# Patient Record
Sex: Female | Born: 1945 | Race: Black or African American | Hispanic: No | Marital: Single | Smoking: Former smoker
Health system: Southern US, Community
[De-identification: ages and names within clinical notes are randomized; demographics above are authoritative.]

## PROBLEM LIST (undated history)

## (undated) DIAGNOSIS — Z8719 Personal history of other diseases of the digestive system: Secondary | ICD-10-CM

## (undated) DIAGNOSIS — E785 Hyperlipidemia, unspecified: Secondary | ICD-10-CM

## (undated) DIAGNOSIS — M1990 Unspecified osteoarthritis, unspecified site: Secondary | ICD-10-CM

## (undated) DIAGNOSIS — K219 Gastro-esophageal reflux disease without esophagitis: Secondary | ICD-10-CM

## (undated) DIAGNOSIS — C801 Malignant (primary) neoplasm, unspecified: Secondary | ICD-10-CM

## (undated) DIAGNOSIS — R002 Palpitations: Secondary | ICD-10-CM

## (undated) DIAGNOSIS — I10 Essential (primary) hypertension: Secondary | ICD-10-CM

## (undated) DIAGNOSIS — J45909 Unspecified asthma, uncomplicated: Secondary | ICD-10-CM

## (undated) DIAGNOSIS — F32A Depression, unspecified: Secondary | ICD-10-CM

## (undated) DIAGNOSIS — J40 Bronchitis, not specified as acute or chronic: Secondary | ICD-10-CM

## (undated) DIAGNOSIS — T7840XA Allergy, unspecified, initial encounter: Secondary | ICD-10-CM

## (undated) DIAGNOSIS — R933 Abnormal findings on diagnostic imaging of other parts of digestive tract: Secondary | ICD-10-CM

## (undated) DIAGNOSIS — M7989 Other specified soft tissue disorders: Secondary | ICD-10-CM

## (undated) DIAGNOSIS — R0602 Shortness of breath: Secondary | ICD-10-CM

## (undated) DIAGNOSIS — R011 Cardiac murmur, unspecified: Secondary | ICD-10-CM

## (undated) DIAGNOSIS — F419 Anxiety disorder, unspecified: Secondary | ICD-10-CM

## (undated) DIAGNOSIS — R079 Chest pain, unspecified: Secondary | ICD-10-CM

## (undated) DIAGNOSIS — M79674 Pain in right toe(s): Secondary | ICD-10-CM

## (undated) DIAGNOSIS — G4730 Sleep apnea, unspecified: Secondary | ICD-10-CM

## (undated) DIAGNOSIS — I209 Angina pectoris, unspecified: Secondary | ICD-10-CM

## (undated) DIAGNOSIS — N189 Chronic kidney disease, unspecified: Secondary | ICD-10-CM

## (undated) HISTORY — DX: Unspecified osteoarthritis, unspecified site: M19.90

## (undated) HISTORY — DX: Diabetes mellitus due to underlying condition with unspecified complications: E08.8

## (undated) HISTORY — DX: Allergy, unspecified, initial encounter: T78.40XA

## (undated) HISTORY — PX: KNEE SURGERY: SHX244

## (undated) HISTORY — PX: SHOULDER SURGERY: SHX246

## (undated) HISTORY — DX: Palpitations: R00.2

## (undated) HISTORY — DX: Other specified soft tissue disorders: M79.89

## (undated) HISTORY — DX: Anxiety disorder, unspecified: F41.9

## (undated) HISTORY — DX: Hyperlipidemia, unspecified: E78.5

## (undated) HISTORY — PX: ABDOMINAL HYSTERECTOMY: SHX81

## (undated) HISTORY — DX: Pain in right toe(s): M79.674

## (undated) HISTORY — PX: BREAST SURGERY: SHX581

## (undated) HISTORY — DX: Chest pain, unspecified: R07.9

## (undated) HISTORY — PX: DILATION AND CURETTAGE OF UTERUS: SHX78

## (undated) HISTORY — DX: Atherosclerotic heart disease of native coronary artery without angina pectoris: I25.10

## (undated) HISTORY — DX: Essential (primary) hypertension: I10

## (undated) HISTORY — PX: WRIST SURGERY: SHX841

## (undated) HISTORY — DX: Sleep apnea, unspecified: G47.30

## (undated) HISTORY — DX: Abnormal findings on diagnostic imaging of other parts of digestive tract: R93.3

## (undated) HISTORY — PX: HYSTEROTOMY: SHX1776

---

## 2000-06-25 ENCOUNTER — Emergency Department (HOSPITAL_COMMUNITY): Admission: EM | Admit: 2000-06-25 | Discharge: 2000-06-25 | Payer: Self-pay | Admitting: Emergency Medicine

## 2004-10-17 ENCOUNTER — Encounter: Admission: RE | Admit: 2004-10-17 | Discharge: 2004-10-17 | Payer: Self-pay | Admitting: Unknown Physician Specialty

## 2006-01-15 ENCOUNTER — Encounter: Admission: RE | Admit: 2006-01-15 | Discharge: 2006-01-15 | Payer: Self-pay | Admitting: Unknown Physician Specialty

## 2007-02-07 ENCOUNTER — Encounter: Admission: RE | Admit: 2007-02-07 | Discharge: 2007-02-07 | Payer: Self-pay | Admitting: Orthopedic Surgery

## 2011-05-16 ENCOUNTER — Telehealth: Payer: Self-pay | Admitting: Gastroenterology

## 2011-05-16 ENCOUNTER — Other Ambulatory Visit: Payer: Self-pay

## 2011-05-16 ENCOUNTER — Telehealth: Payer: Self-pay

## 2011-05-16 DIAGNOSIS — K838 Other specified diseases of biliary tract: Secondary | ICD-10-CM

## 2011-05-16 DIAGNOSIS — R933 Abnormal findings on diagnostic imaging of other parts of digestive tract: Secondary | ICD-10-CM

## 2011-05-16 NOTE — Telephone Encounter (Signed)
Pt has been instructed and meds reviewed pt was also given the pre appt for 05/17/11 at noon at [facility]  Pt will call with any further questions or concerns.

## 2011-05-16 NOTE — Telephone Encounter (Signed)
Pt had another question about her procedure and why she needed to go to the endoscopy unit before the appt to speak with anesthesia I gave her the number so she could get the answers she needed from them

## 2011-05-17 ENCOUNTER — Encounter (HOSPITAL_COMMUNITY)
Admission: RE | Admit: 2011-05-17 | Discharge: 2011-05-17 | Disposition: A | Discharge status: Home / Self Care | Payer: Medicare PPO | Source: Ambulatory Visit | Attending: Gastroenterology | Admitting: Gastroenterology

## 2011-05-17 ENCOUNTER — Encounter (HOSPITAL_COMMUNITY): Payer: Self-pay | Admitting: *Deleted

## 2011-05-17 NOTE — Anesthesia Preprocedure Evaluation (Signed)
Anesthesia Evaluation    Airway Mallampati: II TM Distance: >3 FB Neck ROM: Full    Dental No notable dental hx. (+) Edentulous Upper and Edentulous Lower   Pulmonary shortness of breath and with exertion, Current Smoker,  DOE for a couple years clear to auscultation  Pulmonary exam normal       Cardiovascular hypertension, + angina + Valvular Problems/Murmurs Regular Normal    Neuro/Psych    GI/Hepatic hiatal hernia, GERD-  Medicated,  Endo/Other  Diabetes mellitus-, Type 2  Renal/GU      Musculoskeletal   Abdominal   Peds  Hematology   Anesthesia Other Findings   Reproductive/Obstetrics                           Anesthesia Physical Anesthesia Plan  ASA: III  Anesthesia Plan: MAC   Post-op Pain Management:    Induction: Intravenous  Airway Management Planned:   Additional Equipment:   Intra-op Plan:   Post-operative Plan: Extubation in OR  Informed Consent: I have reviewed the patients History and Physical, chart, labs and discussed the procedure including the risks, benefits and alternatives for the proposed anesthesia with the patient or authorized representative who has indicated his/her understanding and acceptance.   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

## 2011-05-19 ENCOUNTER — Ambulatory Visit (HOSPITAL_COMMUNITY)
Admission: RE | Admit: 2011-05-19 | Discharge: 2011-05-19 | Disposition: A | Discharge status: Home / Self Care | Payer: Medicare PPO | Source: Ambulatory Visit | Attending: Gastroenterology | Admitting: Gastroenterology

## 2011-05-19 ENCOUNTER — Encounter (HOSPITAL_COMMUNITY): Payer: Self-pay | Admitting: *Deleted

## 2011-05-19 ENCOUNTER — Ambulatory Visit (HOSPITAL_COMMUNITY): Payer: Medicare PPO | Admitting: *Deleted

## 2011-05-19 ENCOUNTER — Encounter (HOSPITAL_COMMUNITY)
Admission: RE | Disposition: A | Discharge status: Home / Self Care | Payer: Self-pay | Source: Ambulatory Visit | Attending: Gastroenterology

## 2011-05-19 ENCOUNTER — Encounter (HOSPITAL_COMMUNITY): Payer: Self-pay | Admitting: Gastroenterology

## 2011-05-19 ENCOUNTER — Encounter (HOSPITAL_COMMUNITY): Payer: Self-pay | Admitting: Anesthesiology

## 2011-05-19 DIAGNOSIS — E119 Type 2 diabetes mellitus without complications: Secondary | ICD-10-CM | POA: Insufficient documentation

## 2011-05-19 DIAGNOSIS — E785 Hyperlipidemia, unspecified: Secondary | ICD-10-CM | POA: Insufficient documentation

## 2011-05-19 DIAGNOSIS — R933 Abnormal findings on diagnostic imaging of other parts of digestive tract: Secondary | ICD-10-CM

## 2011-05-19 DIAGNOSIS — K869 Disease of pancreas, unspecified: Secondary | ICD-10-CM | POA: Insufficient documentation

## 2011-05-19 DIAGNOSIS — K219 Gastro-esophageal reflux disease without esophagitis: Secondary | ICD-10-CM | POA: Insufficient documentation

## 2011-05-19 DIAGNOSIS — R109 Unspecified abdominal pain: Secondary | ICD-10-CM | POA: Insufficient documentation

## 2011-05-19 DIAGNOSIS — I10 Essential (primary) hypertension: Secondary | ICD-10-CM | POA: Insufficient documentation

## 2011-05-19 DIAGNOSIS — K449 Diaphragmatic hernia without obstruction or gangrene: Secondary | ICD-10-CM | POA: Insufficient documentation

## 2011-05-19 DIAGNOSIS — R0602 Shortness of breath: Secondary | ICD-10-CM | POA: Insufficient documentation

## 2011-05-19 HISTORY — DX: Essential (primary) hypertension: I10

## 2011-05-19 HISTORY — DX: Angina pectoris, unspecified: I20.9

## 2011-05-19 HISTORY — DX: Bronchitis, not specified as acute or chronic: J40

## 2011-05-19 HISTORY — DX: Abnormal findings on diagnostic imaging of other parts of digestive tract: R93.3

## 2011-05-19 HISTORY — DX: Hyperlipidemia, unspecified: E78.5

## 2011-05-19 HISTORY — DX: Gastro-esophageal reflux disease without esophagitis: K21.9

## 2011-05-19 HISTORY — DX: Cardiac murmur, unspecified: R01.1

## 2011-05-19 HISTORY — DX: Personal history of other diseases of the digestive system: Z87.19

## 2011-05-19 HISTORY — PX: EUS: SHX5427

## 2011-05-19 HISTORY — DX: Shortness of breath: R06.02

## 2011-05-19 LAB — GLUCOSE, CAPILLARY: Glucose-Capillary: 116 mg/dL — ABNORMAL HIGH (ref 70–99)

## 2011-05-19 SURGERY — UPPER ENDOSCOPIC ULTRASOUND (EUS) LINEAR
Anesthesia: Monitor Anesthesia Care

## 2011-05-19 MED ORDER — MIDAZOLAM HCL 5 MG/5ML IJ SOLN
INTRAMUSCULAR | Status: DC | PRN
  Administered 2011-05-19 (×2): 1 mg via INTRAVENOUS

## 2011-05-19 MED ORDER — FENTANYL CITRATE 0.05 MG/ML IJ SOLN
INTRAMUSCULAR | Status: DC | PRN
  Administered 2011-05-19 (×2): 50 ug via INTRAVENOUS

## 2011-05-19 MED ORDER — FENTANYL CITRATE 0.05 MG/ML IJ SOLN: 25.0000 ug | INTRAMUSCULAR | Status: DC | PRN

## 2011-05-19 MED ORDER — PROMETHAZINE HCL 25 MG/ML IJ SOLN: 6.2500 mg | INTRAMUSCULAR | Status: DC | PRN

## 2011-05-19 MED ORDER — PROPOFOL 10 MG/ML IV EMUL
INTRAVENOUS | Status: DC | PRN
  Administered 2011-05-19: 120 ug/kg/min via INTRAVENOUS

## 2011-05-19 MED ORDER — LACTATED RINGERS IV SOLN
INTRAVENOUS | Status: DC
  Administered 2011-05-19: 1000 mL via INTRAVENOUS

## 2011-05-19 NOTE — H&P (Signed)
HPI: This is a  66 yo woman with abd pain.  CT 03/2009 (urogram) showed "prominant pancreatic head" More recent 12/12 CT with IV and po contrast also shows prominent panc head, slightly dilated CBD" but no clear masses.  LFTs normal    Past Medical History  Diagnosis Date  . Hypertension   . Diabetes mellitus   . Hyperlipemia   . GERD (gastroesophageal reflux disease)   . Heart murmur   . Angina     normal stress test  . Shortness of breath     on  excertion  . Bronchitis   . H/O hiatal hernia     Past Surgical History  Procedure Date  . Breast surgery     begin mass,left  breast  . Wrist surgery     rigth  . Shoulder surgery     rotator cuff repair  . Abdominal hysterectomy   . Knee surgery   . Right thumb     tendon repaire  . Dilation and curettage of uterus     one    No current facility-administered medications for this encounter.    Allergies as of 05/16/2011  . (Not on File)    History reviewed. No pertinent family history.  History   Social History  . Marital Status: Single    Spouse Name: N/A    Number of Children: N/A  . Years of Education: N/A   Occupational History  . Not on file.   Social History Main Topics  . Smoking status: Former Smoker -- 1.0 packs/day    Quit date: 05/17/2011  . Smokeless tobacco: Not on file  . Alcohol Use: No  . Drug Use: No  . Sexually Active: No   Other Topics Concern  . Not on file   Social History Narrative  . No narrative on file      Physical Exam: BP 114/70  Pulse 56  Temp(Src) 97.5 F (36.4 C) (Oral)  Resp 15  Ht 5' 5.5" (1.664 m)  Wt 89.812 kg (198 lb)  BMI 32.45 kg/m2  SpO2 99% Constitutional: generally well-appearing Psychiatric: alert and oriented x3 Abdomen: soft, nontender, nondistended, no obvious ascites, no peritoneal signs, normal bowel sounds     Assessment and plan: 66 y.o. female with abnormal ct  Upper EUS +/- fna to evaluated pancrease

## 2011-05-19 NOTE — Transfer of Care (Signed)
Immediate Anesthesia Transfer of Care Note  Patient: Deborah Hutchinson  Procedure(s) Performed:  UPPER ENDOSCOPIC ULTRASOUND (EUS) LINEAR - radial linear   Patient Location: PACU  Anesthesia Type: MAC  Level of Consciousness: awake and alert   Airway & Oxygen Therapy: Patient Spontanous Breathing and Patient connected to nasal cannula oxygen  Post-op Assessment: Report given to PACU RN and Post -op Vital signs reviewed and stable  Post vital signs: Reviewed and stable  Complications: No apparent anesthesia complications

## 2011-05-19 NOTE — Anesthesia Postprocedure Evaluation (Signed)
Anesthesia Post Note  Patient: Deborah Hutchinson  Procedure(s) Performed:  UPPER ENDOSCOPIC ULTRASOUND (EUS) LINEAR - radial linear   Anesthesia type: MAC  Patient location: PACU  Post pain: Pain level controlled  Post assessment: Post-op Vital signs reviewed  Last Vitals:  Filed Vitals:   05/19/11 0809  BP: 152/116  Pulse:   Temp:   Resp: 12    Post vital signs: Reviewed  Level of consciousness: sedated  Complications: No apparent anesthesia complications

## 2011-05-19 NOTE — Op Note (Signed)
[facility] [address] [place], [place]  [address]  ENDOSCOPIC ULTRASOUND PROCEDURE REPORT  PATIENT:  Deborah Hutchinson, Deborah Hutchinson  MR#:  [id number] BIRTHDATE:  [birth year]  GENDER:  female ENDOSCOPIST:  Rachael Fee, MD REFERRED BY:  Laurell Roof, M.D. PROCEDURE DATE:  05/19/2011 PROCEDURE:  Upper EUS ASA CLASS:  Class II INDICATIONS:  abdominal pain; "prominant pancreatic head.without discrete mass" on CT last month.  Normal LFTs. No recent weight loss, no family hisotory of pancreatic cancer, remote (20 years ago) alcohol overuse MEDICATIONS:  MAC sedation, administered by CRNA  DESCRIPTION OF PROCEDURE:   After the risks benefits and alternatives of the procedure were  explained, informed consent was obtained. The patient was then placed in the left, lateral, decubitus postion and IV sedation was administered. Throughout the procedure, the patient's blood pressure, pulse and oxygen saturations were monitored continuously.  Under direct visualization, the  endoscope was introduced through the mouth and advanced to the second portion of the duodenum.  Water was used as necessary to provide an acoustic interface.  Upon completion of the imaging, water was removed and the patient was sent to the recovery room in satisfactory condition. <<PROCEDUREIMAGES>> Endoscopic findings (limited views with radial echoendoscope): 1. Normal UGI tract  EUS findings: 1. Prominant pancreatic head (4cm) without discrete masses. Pancreatic parenchyma throught neck, body and tail was normal as well. 2. Pancreatic divisum ruled out in this examinaiton 3. CBD was non-dilated and without filling defects 4. No peripancreatic adenopathy. 5. Gallbladder was normal 6. Limited views of liver, spleen, portal and splenic vessels were all normal  Impression: Somewhat enlarged pancreatic head without discrete masses, lesions.  ______________________________ Rachael Fee,  MD  n. eSIGNED:   Rachael Fee at 05/19/2011 08:06 AM  Starling Manns, [id number]

## 2011-05-20 ENCOUNTER — Encounter (HOSPITAL_COMMUNITY): Payer: Self-pay | Admitting: Gastroenterology

## 2013-09-05 ENCOUNTER — Ambulatory Visit (INDEPENDENT_AMBULATORY_CARE_PROVIDER_SITE_OTHER): Payer: Commercial Managed Care - HMO

## 2013-09-05 VITALS — BP 137/70 | HR 60 | Resp 12

## 2013-09-05 DIAGNOSIS — E1142 Type 2 diabetes mellitus with diabetic polyneuropathy: Secondary | ICD-10-CM

## 2013-09-05 DIAGNOSIS — E1140 Type 2 diabetes mellitus with diabetic neuropathy, unspecified: Secondary | ICD-10-CM

## 2013-09-05 DIAGNOSIS — L608 Other nail disorders: Secondary | ICD-10-CM

## 2013-09-05 DIAGNOSIS — M79609 Pain in unspecified limb: Secondary | ICD-10-CM

## 2013-09-05 DIAGNOSIS — E1149 Type 2 diabetes mellitus with other diabetic neurological complication: Secondary | ICD-10-CM

## 2013-09-05 NOTE — Patient Instructions (Signed)
Diabetes and Foot Care Diabetes may cause you to have problems because of poor blood supply (circulation) to your feet and legs. This may cause the skin on your feet to become thinner, break easier, and heal more slowly. Your skin may become dry, and the skin may peel and crack. You may also have nerve damage in your legs and feet causing decreased feeling in them. You may not notice minor injuries to your feet that could lead to infections or more serious problems. Taking care of your feet is one of the most important things you can do for yourself.  HOME CARE INSTRUCTIONS  Wear shoes at all times, even in the house. Do not go barefoot. Bare feet are easily injured.  Check your feet daily for blisters, cuts, and redness. If you cannot see the bottom of your feet, use a mirror or ask someone for help.  Wash your feet with warm water (do not use hot water) and mild soap. Then pat your feet and the areas between your toes until they are completely dry. Do not soak your feet as this can dry your skin.  Apply a moisturizing lotion or petroleum jelly (that does not contain alcohol and is unscented) to the skin on your feet and to dry, brittle toenails. Do not apply lotion between your toes.  Trim your toenails straight across. Do not dig under them or around the cuticle. File the edges of your nails with an emery board or nail file.  Do not cut corns or calluses or try to remove them with medicine.  Wear clean socks or stockings every day. Make sure they are not too tight. Do not wear knee-high stockings since they may decrease blood flow to your legs.  Wear shoes that fit properly and have enough cushioning. To break in new shoes, wear them for just a few hours a day. This prevents you from injuring your feet. Always look in your shoes before you put them on to be sure there are no objects inside.  Do not cross your legs. This may decrease the blood flow to your feet.  If you find a minor scrape,  cut, or break in the skin on your feet, keep it and the skin around it clean and dry. These areas may be cleansed with mild soap and water. Do not cleanse the area with peroxide, alcohol, or iodine.  When you remove an adhesive bandage, be sure not to damage the skin around it.  If you have a wound, look at it several times a day to make sure it is healing.  Do not use heating pads or hot water bottles. They may burn your skin. If you have lost feeling in your feet or legs, you may not know it is happening until it is too late.  Make sure your health care provider performs a complete foot exam at least annually or more often if you have foot problems. Report any cuts, sores, or bruises to your health care provider immediately. SEEK MEDICAL CARE IF:   You have an injury that is not healing.  You have cuts or breaks in the skin.  You have an ingrown nail.  You notice redness on your legs or feet.  You feel burning or tingling in your legs or feet.  You have pain or cramps in your legs and feet.  Your legs or feet are numb.  Your feet always feel cold. SEEK IMMEDIATE MEDICAL CARE IF:   There is increasing redness,   swelling, or pain in or around a wound.  There is a red line that goes up your leg.  Pus is coming from a wound.  You develop a fever or as directed by your health care provider.  You notice a bad smell coming from an ulcer or wound. Document Released: 04/22/2000 Document Revised: 12/26/2012 Document Reviewed: 10/02/2012 [facility] Patient Information 2014 [place].  As far as the small spicules of nail growing from the right great toe keep them trimmed down and are file down and cleaner bursa with soap and water and to apply some Neosporin occasionally as needed to avoid irritation

## 2013-09-05 NOTE — Progress Notes (Signed)
   Subjective:    Patient ID: Deborah Hutchinson, female    DOB: 08/08/1945, 68 y.o.   MRN: [id number]  HPI PT STATED RT FOOT GREAT TOENAIL IS BEEN HURTING FOR MONTHS. THE TOE IS GETTING BETTER. THE TOE GET AGGRAVATED BY PRESSURE AND WEARING SHOES. TRIED TAKE ANTIBIOTIC RX DOXYCYCLINE  FOR 10 DAYS AND ITS GETTING BETTER.    Review of Systems  All other systems reviewed and are negative.      Objective:   Physical Exam 68-year-old female well-developed well-nourished oriented x3 presents at this time for followup of right great toenail patient has had previous nail excision of the entire nail plate has residual nail spicules the medial lateral nail fold these are not infected current time we'll patient case there were darkened and discolored and somewhat tender recently she is treated with some doxycycline antibiotic. The nailbed appears to be clean having had previous partial or total excision there is some spicules the borders which are debrided at this time no active infection no discharge or drainage no pain or tenderness is reproduced this time vascular status is intact pedal pulses palpable DP +2/4 bilateral PT plus one over 4 bilateral capillary refill time 3 seconds all digits. Epicritic and proprioceptive sensations intact although diminished on Semmes Weinstein testing to the digits and plantar forefoot arch. DTRs not listed there is normal plantar response noted orthopedic biomechanical exam rectus foot type mild digital contractures are noted. Neurologically no other ulcers no open wounds no signs of infection is noted       Assessment & Plan:  Assessment this time is some residual ingrowing nail spicules of the right hallux no active infection at the current time these are debrided and suggested normal hygiene and debridement in the future as needed if there continues to be any recurrence infection may be K. for redo nail procedure phenol matricectomy at some point otherwise monitor wash and  keep appropriate shoes avoid contusion to the area. Reappointed in the future as needed for followup  Harriet Masson DPM

## 2013-11-07 ENCOUNTER — Ambulatory Visit: Payer: Commercial Managed Care - HMO

## 2013-11-21 ENCOUNTER — Ambulatory Visit (INDEPENDENT_AMBULATORY_CARE_PROVIDER_SITE_OTHER): Payer: Commercial Managed Care - HMO

## 2013-11-21 VITALS — BP 116/60 | HR 73 | Resp 18

## 2013-11-21 DIAGNOSIS — M79609 Pain in unspecified limb: Secondary | ICD-10-CM

## 2013-11-21 DIAGNOSIS — E1140 Type 2 diabetes mellitus with diabetic neuropathy, unspecified: Secondary | ICD-10-CM

## 2013-11-21 DIAGNOSIS — L608 Other nail disorders: Secondary | ICD-10-CM

## 2013-11-21 DIAGNOSIS — M79606 Pain in leg, unspecified: Secondary | ICD-10-CM

## 2013-11-21 NOTE — Patient Instructions (Signed)
Diabetes and Foot Care Diabetes may cause you to have problems because of poor blood supply (circulation) to your feet and legs. This may cause the skin on your feet to become thinner, break easier, and heal more slowly. Your skin may become dry, and the skin may peel and crack. You may also have nerve damage in your legs and feet causing decreased feeling in them. You may not notice minor injuries to your feet that could lead to infections or more serious problems. Taking care of your feet is one of the most important things you can do for yourself.  HOME CARE INSTRUCTIONS  Wear shoes at all times, even in the house. Do not go barefoot. Bare feet are easily injured.  Check your feet daily for blisters, cuts, and redness. If you cannot see the bottom of your feet, use a mirror or ask someone for help.  Wash your feet with warm water (do not use hot water) and mild soap. Then pat your feet and the areas between your toes until they are completely dry. Do not soak your feet as this can dry your skin.  Apply a moisturizing lotion or petroleum jelly (that does not contain alcohol and is unscented) to the skin on your feet and to dry, brittle toenails. Do not apply lotion between your toes.  Trim your toenails straight across. Do not dig under them or around the cuticle. File the edges of your nails with an emery board or nail file.  Do not cut corns or calluses or try to remove them with medicine.  Wear clean socks or stockings every day. Make sure they are not too tight. Do not wear knee-high stockings since they may decrease blood flow to your legs.  Wear shoes that fit properly and have enough cushioning. To break in new shoes, wear them for just a few hours a day. This prevents you from injuring your feet. Always look in your shoes before you put them on to be sure there are no objects inside.  Do not cross your legs. This may decrease the blood flow to your feet.  If you find a minor scrape,  cut, or break in the skin on your feet, keep it and the skin around it clean and dry. These areas may be cleansed with mild soap and water. Do not cleanse the area with peroxide, alcohol, or iodine.  When you remove an adhesive bandage, be sure not to damage the skin around it.  If you have a wound, look at it several times a day to make sure it is healing.  Do not use heating pads or hot water bottles. They may burn your skin. If you have lost feeling in your feet or legs, you may not know it is happening until it is too late.  Make sure your health care provider performs a complete foot exam at least annually or more often if you have foot problems. Report any cuts, sores, or bruises to your health care provider immediately. SEEK MEDICAL CARE IF:   You have an injury that is not healing.  You have cuts or breaks in the skin.  You have an ingrown nail.  You notice redness on your legs or feet.  You feel burning or tingling in your legs or feet.  You have pain or cramps in your legs and feet.  Your legs or feet are numb.  Your feet always feel cold. SEEK IMMEDIATE MEDICAL CARE IF:   There is increasing redness,   swelling, or pain in or around a wound.  There is a red line that goes up your leg.  Pus is coming from a wound.  You develop a fever or as directed by your health care provider.  You notice a bad smell coming from an ulcer or wound. Document Released: 04/22/2000 Document Revised: 12/26/2012 Document Reviewed: 10/02/2012 [employer] Patient Information 2015 [employer]. This information is not intended to replace advice given to you by your health care provider. Make sure you discuss any questions you have with your health care provider.  

## 2013-11-21 NOTE — Progress Notes (Signed)
   Subjective:    Patient ID: Deborah Hutchinson, female    DOB: 12/28/1945, 68 y.o.   MRN: [id number]  HPI MY RIGHT BIG TOENAIL FEELS LIKE I STILL HAVE AN INGROWN AND IT IS TOUCHY    Review of Systems no new findings or systemic changes noted     Objective:   Physical Exam 68-year-old [place] American female well-developed well-nourished oriented x3 presents this time has had pre-per total nail excision done on the right great toe for continues to have a feeling or sensation of an ingrown nail. There is some hypertrophic keratosis or scar tissue on the nail bed no nail regrowthnormal no edema no erythema no discharge or drainage no sign of infection or chain objective findings reveal pedal pulses palpable DP +2 PT plus one over 4. Capillary refill time 3 seconds all digits epicritic and proprioceptive sensations intact although diminished on Semmes Weinstein to the forefoot and digits patient may also be having some paresthesias to her diabetic neuropathy as well some mild arthropathy affecting the hallux. Remainder of exam unremarkable       Assessment & Plan:  Assessment this time diabetes with peripheral neuropathy posse mild arthropathy affecting the hallux there is a hypertrophic scar tissue which is debrided away recommended using a lotion are cocoa butter to the nailbed keeping the nailbed softening prevent keratoses if there is any problems in the future followup as needed no signs of infection noted no signs of regrowth of nail noted discharge to an as-needed basis for any future followup  Harriet Masson DPM

## 2014-05-14 DIAGNOSIS — F17210 Nicotine dependence, cigarettes, uncomplicated: Secondary | ICD-10-CM | POA: Diagnosis not present

## 2014-05-14 DIAGNOSIS — I2510 Atherosclerotic heart disease of native coronary artery without angina pectoris: Secondary | ICD-10-CM | POA: Diagnosis not present

## 2014-05-14 DIAGNOSIS — E785 Hyperlipidemia, unspecified: Secondary | ICD-10-CM | POA: Diagnosis not present

## 2014-05-14 DIAGNOSIS — I10 Essential (primary) hypertension: Secondary | ICD-10-CM | POA: Diagnosis not present

## 2014-05-14 DIAGNOSIS — E119 Type 2 diabetes mellitus without complications: Secondary | ICD-10-CM | POA: Diagnosis not present

## 2014-05-26 DIAGNOSIS — J309 Allergic rhinitis, unspecified: Secondary | ICD-10-CM | POA: Diagnosis not present

## 2014-05-26 DIAGNOSIS — M654 Radial styloid tenosynovitis [de Quervain]: Secondary | ICD-10-CM | POA: Diagnosis not present

## 2014-05-26 DIAGNOSIS — E559 Vitamin D deficiency, unspecified: Secondary | ICD-10-CM | POA: Diagnosis not present

## 2014-05-26 DIAGNOSIS — Z6830 Body mass index (BMI) 30.0-30.9, adult: Secondary | ICD-10-CM | POA: Diagnosis not present

## 2014-06-17 DIAGNOSIS — Z1389 Encounter for screening for other disorder: Secondary | ICD-10-CM | POA: Diagnosis not present

## 2014-06-17 DIAGNOSIS — Z6830 Body mass index (BMI) 30.0-30.9, adult: Secondary | ICD-10-CM | POA: Diagnosis not present

## 2014-06-17 DIAGNOSIS — Z9181 History of falling: Secondary | ICD-10-CM | POA: Diagnosis not present

## 2014-06-17 DIAGNOSIS — M654 Radial styloid tenosynovitis [de Quervain]: Secondary | ICD-10-CM | POA: Diagnosis not present

## 2014-06-17 DIAGNOSIS — J45909 Unspecified asthma, uncomplicated: Secondary | ICD-10-CM | POA: Diagnosis not present

## 2014-06-17 DIAGNOSIS — J309 Allergic rhinitis, unspecified: Secondary | ICD-10-CM | POA: Diagnosis not present

## 2014-07-17 DIAGNOSIS — K219 Gastro-esophageal reflux disease without esophagitis: Secondary | ICD-10-CM | POA: Diagnosis not present

## 2014-07-17 DIAGNOSIS — E119 Type 2 diabetes mellitus without complications: Secondary | ICD-10-CM | POA: Diagnosis not present

## 2014-07-17 DIAGNOSIS — J449 Chronic obstructive pulmonary disease, unspecified: Secondary | ICD-10-CM | POA: Diagnosis not present

## 2014-07-17 DIAGNOSIS — M654 Radial styloid tenosynovitis [de Quervain]: Secondary | ICD-10-CM | POA: Diagnosis not present

## 2014-07-17 DIAGNOSIS — Z79899 Other long term (current) drug therapy: Secondary | ICD-10-CM | POA: Diagnosis not present

## 2014-07-17 DIAGNOSIS — M7061 Trochanteric bursitis, right hip: Secondary | ICD-10-CM | POA: Diagnosis not present

## 2014-07-17 DIAGNOSIS — E559 Vitamin D deficiency, unspecified: Secondary | ICD-10-CM | POA: Diagnosis not present

## 2014-07-17 DIAGNOSIS — I10 Essential (primary) hypertension: Secondary | ICD-10-CM | POA: Diagnosis not present

## 2014-07-17 DIAGNOSIS — E785 Hyperlipidemia, unspecified: Secondary | ICD-10-CM | POA: Diagnosis not present

## 2014-07-30 DIAGNOSIS — H524 Presbyopia: Secondary | ICD-10-CM | POA: Diagnosis not present

## 2014-07-30 DIAGNOSIS — H5210 Myopia, unspecified eye: Secondary | ICD-10-CM | POA: Diagnosis not present

## 2014-07-30 DIAGNOSIS — E119 Type 2 diabetes mellitus without complications: Secondary | ICD-10-CM | POA: Diagnosis not present

## 2014-08-11 DIAGNOSIS — H524 Presbyopia: Secondary | ICD-10-CM | POA: Diagnosis not present

## 2014-08-11 DIAGNOSIS — H52209 Unspecified astigmatism, unspecified eye: Secondary | ICD-10-CM | POA: Diagnosis not present

## 2014-08-11 DIAGNOSIS — Z0100 Encounter for examination of eyes and vision without abnormal findings: Secondary | ICD-10-CM | POA: Diagnosis not present

## 2014-08-11 DIAGNOSIS — H5203 Hypermetropia, bilateral: Secondary | ICD-10-CM | POA: Diagnosis not present

## 2014-08-12 DIAGNOSIS — D2361 Other benign neoplasm of skin of right upper limb, including shoulder: Secondary | ICD-10-CM | POA: Diagnosis not present

## 2014-08-26 DIAGNOSIS — D2372 Other benign neoplasm of skin of left lower limb, including hip: Secondary | ICD-10-CM | POA: Diagnosis not present

## 2014-09-09 DIAGNOSIS — L720 Epidermal cyst: Secondary | ICD-10-CM | POA: Diagnosis not present

## 2014-09-22 DIAGNOSIS — H68001 Unspecified Eustachian salpingitis, right ear: Secondary | ICD-10-CM | POA: Diagnosis not present

## 2014-09-22 DIAGNOSIS — J309 Allergic rhinitis, unspecified: Secondary | ICD-10-CM | POA: Diagnosis not present

## 2014-09-22 DIAGNOSIS — Z6831 Body mass index (BMI) 31.0-31.9, adult: Secondary | ICD-10-CM | POA: Diagnosis not present

## 2014-09-22 DIAGNOSIS — H6123 Impacted cerumen, bilateral: Secondary | ICD-10-CM | POA: Diagnosis not present

## 2014-10-21 DIAGNOSIS — Z1231 Encounter for screening mammogram for malignant neoplasm of breast: Secondary | ICD-10-CM | POA: Diagnosis not present

## 2014-11-05 DIAGNOSIS — R7989 Other specified abnormal findings of blood chemistry: Secondary | ICD-10-CM | POA: Diagnosis not present

## 2014-11-05 DIAGNOSIS — E119 Type 2 diabetes mellitus without complications: Secondary | ICD-10-CM | POA: Diagnosis not present

## 2014-11-05 DIAGNOSIS — R071 Chest pain on breathing: Secondary | ICD-10-CM | POA: Diagnosis not present

## 2014-11-05 DIAGNOSIS — E785 Hyperlipidemia, unspecified: Secondary | ICD-10-CM | POA: Diagnosis not present

## 2014-11-05 DIAGNOSIS — L089 Local infection of the skin and subcutaneous tissue, unspecified: Secondary | ICD-10-CM | POA: Diagnosis not present

## 2014-11-05 DIAGNOSIS — I10 Essential (primary) hypertension: Secondary | ICD-10-CM | POA: Diagnosis not present

## 2014-11-05 DIAGNOSIS — J309 Allergic rhinitis, unspecified: Secondary | ICD-10-CM | POA: Diagnosis not present

## 2014-12-09 DIAGNOSIS — R109 Unspecified abdominal pain: Secondary | ICD-10-CM | POA: Diagnosis not present

## 2014-12-09 DIAGNOSIS — J449 Chronic obstructive pulmonary disease, unspecified: Secondary | ICD-10-CM | POA: Diagnosis not present

## 2014-12-09 DIAGNOSIS — Z6831 Body mass index (BMI) 31.0-31.9, adult: Secondary | ICD-10-CM | POA: Diagnosis not present

## 2015-01-15 DIAGNOSIS — L601 Onycholysis: Secondary | ICD-10-CM | POA: Diagnosis not present

## 2015-01-15 DIAGNOSIS — L910 Hypertrophic scar: Secondary | ICD-10-CM | POA: Diagnosis not present

## 2015-02-02 DIAGNOSIS — Z9181 History of falling: Secondary | ICD-10-CM | POA: Diagnosis not present

## 2015-02-02 DIAGNOSIS — E119 Type 2 diabetes mellitus without complications: Secondary | ICD-10-CM | POA: Diagnosis not present

## 2015-02-02 DIAGNOSIS — E785 Hyperlipidemia, unspecified: Secondary | ICD-10-CM | POA: Diagnosis not present

## 2015-02-02 DIAGNOSIS — Z139 Encounter for screening, unspecified: Secondary | ICD-10-CM | POA: Diagnosis not present

## 2015-02-02 DIAGNOSIS — Z1212 Encounter for screening for malignant neoplasm of rectum: Secondary | ICD-10-CM | POA: Diagnosis not present

## 2015-02-02 DIAGNOSIS — I10 Essential (primary) hypertension: Secondary | ICD-10-CM | POA: Diagnosis not present

## 2015-02-02 DIAGNOSIS — Z0000 Encounter for general adult medical examination without abnormal findings: Secondary | ICD-10-CM | POA: Diagnosis not present

## 2015-02-02 DIAGNOSIS — E559 Vitamin D deficiency, unspecified: Secondary | ICD-10-CM | POA: Diagnosis not present

## 2015-02-26 DIAGNOSIS — L209 Atopic dermatitis, unspecified: Secondary | ICD-10-CM | POA: Diagnosis not present

## 2015-02-27 DIAGNOSIS — Z23 Encounter for immunization: Secondary | ICD-10-CM | POA: Diagnosis not present

## 2015-03-11 DIAGNOSIS — J449 Chronic obstructive pulmonary disease, unspecified: Secondary | ICD-10-CM | POA: Diagnosis not present

## 2015-03-11 DIAGNOSIS — J309 Allergic rhinitis, unspecified: Secondary | ICD-10-CM | POA: Diagnosis not present

## 2015-03-11 DIAGNOSIS — E119 Type 2 diabetes mellitus without complications: Secondary | ICD-10-CM | POA: Diagnosis not present

## 2015-03-11 DIAGNOSIS — E663 Overweight: Secondary | ICD-10-CM | POA: Diagnosis not present

## 2015-03-11 DIAGNOSIS — Z6831 Body mass index (BMI) 31.0-31.9, adult: Secondary | ICD-10-CM | POA: Diagnosis not present

## 2015-04-15 DIAGNOSIS — J45909 Unspecified asthma, uncomplicated: Secondary | ICD-10-CM | POA: Diagnosis not present

## 2015-04-15 DIAGNOSIS — E1151 Type 2 diabetes mellitus with diabetic peripheral angiopathy without gangrene: Secondary | ICD-10-CM | POA: Diagnosis not present

## 2015-04-15 DIAGNOSIS — I739 Peripheral vascular disease, unspecified: Secondary | ICD-10-CM | POA: Diagnosis not present

## 2015-04-15 DIAGNOSIS — E1169 Type 2 diabetes mellitus with other specified complication: Secondary | ICD-10-CM | POA: Diagnosis not present

## 2015-04-15 DIAGNOSIS — K219 Gastro-esophageal reflux disease without esophagitis: Secondary | ICD-10-CM | POA: Diagnosis not present

## 2015-04-15 DIAGNOSIS — E782 Mixed hyperlipidemia: Secondary | ICD-10-CM | POA: Diagnosis not present

## 2015-04-15 DIAGNOSIS — E669 Obesity, unspecified: Secondary | ICD-10-CM | POA: Diagnosis not present

## 2015-04-15 DIAGNOSIS — Z0000 Encounter for general adult medical examination without abnormal findings: Secondary | ICD-10-CM | POA: Diagnosis not present

## 2015-04-23 DIAGNOSIS — H9313 Tinnitus, bilateral: Secondary | ICD-10-CM | POA: Diagnosis not present

## 2015-04-23 DIAGNOSIS — H1010 Acute atopic conjunctivitis, unspecified eye: Secondary | ICD-10-CM | POA: Diagnosis not present

## 2015-04-23 DIAGNOSIS — J309 Allergic rhinitis, unspecified: Secondary | ICD-10-CM | POA: Diagnosis not present

## 2015-04-23 DIAGNOSIS — J449 Chronic obstructive pulmonary disease, unspecified: Secondary | ICD-10-CM | POA: Diagnosis not present

## 2015-04-23 DIAGNOSIS — E669 Obesity, unspecified: Secondary | ICD-10-CM | POA: Diagnosis not present

## 2015-04-23 DIAGNOSIS — Z1389 Encounter for screening for other disorder: Secondary | ICD-10-CM | POA: Diagnosis not present

## 2015-04-23 DIAGNOSIS — Z6831 Body mass index (BMI) 31.0-31.9, adult: Secondary | ICD-10-CM | POA: Diagnosis not present

## 2015-05-26 DIAGNOSIS — E119 Type 2 diabetes mellitus without complications: Secondary | ICD-10-CM | POA: Diagnosis not present

## 2015-05-26 DIAGNOSIS — R072 Precordial pain: Secondary | ICD-10-CM | POA: Diagnosis not present

## 2015-05-26 DIAGNOSIS — Z6832 Body mass index (BMI) 32.0-32.9, adult: Secondary | ICD-10-CM | POA: Diagnosis not present

## 2015-05-26 DIAGNOSIS — K219 Gastro-esophageal reflux disease without esophagitis: Secondary | ICD-10-CM | POA: Diagnosis not present

## 2015-05-26 DIAGNOSIS — E669 Obesity, unspecified: Secondary | ICD-10-CM | POA: Diagnosis not present

## 2015-06-04 DIAGNOSIS — K219 Gastro-esophageal reflux disease without esophagitis: Secondary | ICD-10-CM | POA: Diagnosis not present

## 2015-06-04 DIAGNOSIS — Z1389 Encounter for screening for other disorder: Secondary | ICD-10-CM | POA: Diagnosis not present

## 2015-06-04 DIAGNOSIS — K5900 Constipation, unspecified: Secondary | ICD-10-CM | POA: Diagnosis not present

## 2015-06-04 DIAGNOSIS — E785 Hyperlipidemia, unspecified: Secondary | ICD-10-CM | POA: Diagnosis not present

## 2015-06-04 DIAGNOSIS — R7989 Other specified abnormal findings of blood chemistry: Secondary | ICD-10-CM | POA: Diagnosis not present

## 2015-06-04 DIAGNOSIS — D473 Essential (hemorrhagic) thrombocythemia: Secondary | ICD-10-CM | POA: Diagnosis not present

## 2015-06-04 DIAGNOSIS — I10 Essential (primary) hypertension: Secondary | ICD-10-CM | POA: Diagnosis not present

## 2015-06-04 DIAGNOSIS — E119 Type 2 diabetes mellitus without complications: Secondary | ICD-10-CM | POA: Diagnosis not present

## 2015-06-04 DIAGNOSIS — J449 Chronic obstructive pulmonary disease, unspecified: Secondary | ICD-10-CM | POA: Diagnosis not present

## 2015-06-04 DIAGNOSIS — E559 Vitamin D deficiency, unspecified: Secondary | ICD-10-CM | POA: Diagnosis not present

## 2015-06-04 DIAGNOSIS — Z79899 Other long term (current) drug therapy: Secondary | ICD-10-CM | POA: Diagnosis not present

## 2015-06-10 DIAGNOSIS — E785 Hyperlipidemia, unspecified: Secondary | ICD-10-CM

## 2015-06-10 DIAGNOSIS — I2510 Atherosclerotic heart disease of native coronary artery without angina pectoris: Secondary | ICD-10-CM

## 2015-06-10 DIAGNOSIS — I10 Essential (primary) hypertension: Secondary | ICD-10-CM | POA: Insufficient documentation

## 2015-06-10 DIAGNOSIS — E088 Diabetes mellitus due to underlying condition with unspecified complications: Secondary | ICD-10-CM

## 2015-06-10 DIAGNOSIS — E782 Mixed hyperlipidemia: Secondary | ICD-10-CM

## 2015-06-10 HISTORY — DX: Hyperlipidemia, unspecified: E78.5

## 2015-06-10 HISTORY — DX: Mixed hyperlipidemia: E78.2

## 2015-06-10 HISTORY — DX: Diabetes mellitus due to underlying condition with unspecified complications: E08.8

## 2015-06-10 HISTORY — DX: Atherosclerotic heart disease of native coronary artery without angina pectoris: I25.10

## 2015-06-10 HISTORY — DX: Essential (primary) hypertension: I10

## 2015-06-16 DIAGNOSIS — I2510 Atherosclerotic heart disease of native coronary artery without angina pectoris: Secondary | ICD-10-CM | POA: Diagnosis not present

## 2015-06-16 DIAGNOSIS — E088 Diabetes mellitus due to underlying condition with unspecified complications: Secondary | ICD-10-CM | POA: Diagnosis not present

## 2015-06-16 DIAGNOSIS — I10 Essential (primary) hypertension: Secondary | ICD-10-CM | POA: Diagnosis not present

## 2015-06-16 DIAGNOSIS — E785 Hyperlipidemia, unspecified: Secondary | ICD-10-CM | POA: Diagnosis not present

## 2015-06-17 DIAGNOSIS — D473 Essential (hemorrhagic) thrombocythemia: Secondary | ICD-10-CM | POA: Diagnosis not present

## 2015-06-18 DIAGNOSIS — Z6832 Body mass index (BMI) 32.0-32.9, adult: Secondary | ICD-10-CM | POA: Diagnosis not present

## 2015-06-18 DIAGNOSIS — R635 Abnormal weight gain: Secondary | ICD-10-CM | POA: Diagnosis not present

## 2015-06-18 DIAGNOSIS — E669 Obesity, unspecified: Secondary | ICD-10-CM | POA: Diagnosis not present

## 2015-06-18 DIAGNOSIS — Z79899 Other long term (current) drug therapy: Secondary | ICD-10-CM | POA: Diagnosis not present

## 2015-06-30 DIAGNOSIS — D473 Essential (hemorrhagic) thrombocythemia: Secondary | ICD-10-CM | POA: Diagnosis not present

## 2015-06-30 DIAGNOSIS — Z6831 Body mass index (BMI) 31.0-31.9, adult: Secondary | ICD-10-CM | POA: Diagnosis not present

## 2015-06-30 DIAGNOSIS — Z1389 Encounter for screening for other disorder: Secondary | ICD-10-CM | POA: Diagnosis not present

## 2015-06-30 DIAGNOSIS — K2970 Gastritis, unspecified, without bleeding: Secondary | ICD-10-CM | POA: Diagnosis not present

## 2015-06-30 DIAGNOSIS — J209 Acute bronchitis, unspecified: Secondary | ICD-10-CM | POA: Diagnosis not present

## 2015-07-02 DIAGNOSIS — D473 Essential (hemorrhagic) thrombocythemia: Secondary | ICD-10-CM | POA: Diagnosis not present

## 2015-07-02 DIAGNOSIS — D509 Iron deficiency anemia, unspecified: Secondary | ICD-10-CM | POA: Diagnosis not present

## 2015-07-03 DIAGNOSIS — J449 Chronic obstructive pulmonary disease, unspecified: Secondary | ICD-10-CM | POA: Diagnosis not present

## 2015-07-03 DIAGNOSIS — J45909 Unspecified asthma, uncomplicated: Secondary | ICD-10-CM | POA: Diagnosis not present

## 2015-07-06 DIAGNOSIS — R109 Unspecified abdominal pain: Secondary | ICD-10-CM | POA: Diagnosis not present

## 2015-07-06 DIAGNOSIS — R1011 Right upper quadrant pain: Secondary | ICD-10-CM | POA: Diagnosis not present

## 2015-07-09 DIAGNOSIS — D509 Iron deficiency anemia, unspecified: Secondary | ICD-10-CM | POA: Diagnosis not present

## 2015-07-09 DIAGNOSIS — D473 Essential (hemorrhagic) thrombocythemia: Secondary | ICD-10-CM | POA: Diagnosis not present

## 2015-07-16 DIAGNOSIS — R635 Abnormal weight gain: Secondary | ICD-10-CM | POA: Diagnosis not present

## 2015-07-16 DIAGNOSIS — E669 Obesity, unspecified: Secondary | ICD-10-CM | POA: Diagnosis not present

## 2015-07-16 DIAGNOSIS — Z6830 Body mass index (BMI) 30.0-30.9, adult: Secondary | ICD-10-CM | POA: Diagnosis not present

## 2015-07-23 DIAGNOSIS — D500 Iron deficiency anemia secondary to blood loss (chronic): Secondary | ICD-10-CM | POA: Diagnosis not present

## 2015-07-23 DIAGNOSIS — K5904 Chronic idiopathic constipation: Secondary | ICD-10-CM | POA: Diagnosis not present

## 2015-07-23 DIAGNOSIS — Z86010 Personal history of colonic polyps: Secondary | ICD-10-CM | POA: Diagnosis not present

## 2015-07-23 DIAGNOSIS — K219 Gastro-esophageal reflux disease without esophagitis: Secondary | ICD-10-CM | POA: Diagnosis not present

## 2015-08-05 DIAGNOSIS — R7989 Other specified abnormal findings of blood chemistry: Secondary | ICD-10-CM | POA: Diagnosis not present

## 2015-08-05 DIAGNOSIS — J449 Chronic obstructive pulmonary disease, unspecified: Secondary | ICD-10-CM | POA: Diagnosis not present

## 2015-08-05 DIAGNOSIS — E785 Hyperlipidemia, unspecified: Secondary | ICD-10-CM | POA: Diagnosis not present

## 2015-08-05 DIAGNOSIS — M1990 Unspecified osteoarthritis, unspecified site: Secondary | ICD-10-CM | POA: Diagnosis not present

## 2015-08-05 DIAGNOSIS — Z6830 Body mass index (BMI) 30.0-30.9, adult: Secondary | ICD-10-CM | POA: Diagnosis not present

## 2015-08-05 DIAGNOSIS — J309 Allergic rhinitis, unspecified: Secondary | ICD-10-CM | POA: Diagnosis not present

## 2015-08-05 DIAGNOSIS — E119 Type 2 diabetes mellitus without complications: Secondary | ICD-10-CM | POA: Diagnosis not present

## 2015-08-05 DIAGNOSIS — F419 Anxiety disorder, unspecified: Secondary | ICD-10-CM | POA: Diagnosis not present

## 2015-08-05 DIAGNOSIS — E669 Obesity, unspecified: Secondary | ICD-10-CM | POA: Diagnosis not present

## 2015-08-17 DIAGNOSIS — D122 Benign neoplasm of ascending colon: Secondary | ICD-10-CM | POA: Diagnosis not present

## 2015-08-17 DIAGNOSIS — K5730 Diverticulosis of large intestine without perforation or abscess without bleeding: Secondary | ICD-10-CM | POA: Diagnosis not present

## 2015-08-17 DIAGNOSIS — K449 Diaphragmatic hernia without obstruction or gangrene: Secondary | ICD-10-CM | POA: Diagnosis not present

## 2015-08-17 DIAGNOSIS — I10 Essential (primary) hypertension: Secondary | ICD-10-CM | POA: Diagnosis not present

## 2015-08-17 DIAGNOSIS — D500 Iron deficiency anemia secondary to blood loss (chronic): Secondary | ICD-10-CM | POA: Diagnosis not present

## 2015-08-17 DIAGNOSIS — K219 Gastro-esophageal reflux disease without esophagitis: Secondary | ICD-10-CM | POA: Diagnosis not present

## 2015-08-17 DIAGNOSIS — K648 Other hemorrhoids: Secondary | ICD-10-CM | POA: Diagnosis not present

## 2015-08-17 DIAGNOSIS — Z86010 Personal history of colonic polyps: Secondary | ICD-10-CM | POA: Diagnosis not present

## 2015-08-17 DIAGNOSIS — K5904 Chronic idiopathic constipation: Secondary | ICD-10-CM | POA: Diagnosis not present

## 2015-08-17 HISTORY — PX: COLONOSCOPY: SHX174

## 2015-08-17 HISTORY — PX: ESOPHAGOGASTRODUODENOSCOPY: SHX1529

## 2015-08-18 DIAGNOSIS — H524 Presbyopia: Secondary | ICD-10-CM | POA: Diagnosis not present

## 2015-08-18 DIAGNOSIS — H5210 Myopia, unspecified eye: Secondary | ICD-10-CM | POA: Diagnosis not present

## 2015-08-18 DIAGNOSIS — E119 Type 2 diabetes mellitus without complications: Secondary | ICD-10-CM | POA: Diagnosis not present

## 2015-08-20 DIAGNOSIS — M7061 Trochanteric bursitis, right hip: Secondary | ICD-10-CM | POA: Diagnosis not present

## 2015-08-20 DIAGNOSIS — M722 Plantar fascial fibromatosis: Secondary | ICD-10-CM | POA: Diagnosis not present

## 2015-08-20 DIAGNOSIS — Z6830 Body mass index (BMI) 30.0-30.9, adult: Secondary | ICD-10-CM | POA: Diagnosis not present

## 2015-08-20 DIAGNOSIS — E669 Obesity, unspecified: Secondary | ICD-10-CM | POA: Diagnosis not present

## 2015-08-25 DIAGNOSIS — Z6830 Body mass index (BMI) 30.0-30.9, adult: Secondary | ICD-10-CM | POA: Diagnosis not present

## 2015-08-25 DIAGNOSIS — M722 Plantar fascial fibromatosis: Secondary | ICD-10-CM | POA: Diagnosis not present

## 2015-08-25 DIAGNOSIS — M7061 Trochanteric bursitis, right hip: Secondary | ICD-10-CM | POA: Diagnosis not present

## 2015-08-25 DIAGNOSIS — E669 Obesity, unspecified: Secondary | ICD-10-CM | POA: Diagnosis not present

## 2015-09-03 DIAGNOSIS — D509 Iron deficiency anemia, unspecified: Secondary | ICD-10-CM | POA: Diagnosis not present

## 2015-09-03 DIAGNOSIS — R0602 Shortness of breath: Secondary | ICD-10-CM | POA: Diagnosis not present

## 2015-09-03 DIAGNOSIS — E119 Type 2 diabetes mellitus without complications: Secondary | ICD-10-CM | POA: Diagnosis not present

## 2015-09-10 DIAGNOSIS — R0600 Dyspnea, unspecified: Secondary | ICD-10-CM | POA: Diagnosis not present

## 2015-09-10 DIAGNOSIS — J4530 Mild persistent asthma, uncomplicated: Secondary | ICD-10-CM | POA: Diagnosis not present

## 2015-09-10 DIAGNOSIS — R5383 Other fatigue: Secondary | ICD-10-CM | POA: Diagnosis not present

## 2015-09-25 DIAGNOSIS — J4530 Mild persistent asthma, uncomplicated: Secondary | ICD-10-CM | POA: Diagnosis not present

## 2015-09-25 DIAGNOSIS — R0600 Dyspnea, unspecified: Secondary | ICD-10-CM | POA: Diagnosis not present

## 2015-10-02 DIAGNOSIS — R5383 Other fatigue: Secondary | ICD-10-CM | POA: Diagnosis not present

## 2015-10-02 DIAGNOSIS — J4530 Mild persistent asthma, uncomplicated: Secondary | ICD-10-CM | POA: Diagnosis not present

## 2015-10-06 DIAGNOSIS — Z09 Encounter for follow-up examination after completed treatment for conditions other than malignant neoplasm: Secondary | ICD-10-CM | POA: Diagnosis not present

## 2015-10-06 DIAGNOSIS — E785 Hyperlipidemia, unspecified: Secondary | ICD-10-CM | POA: Diagnosis not present

## 2015-10-06 DIAGNOSIS — Z780 Asymptomatic menopausal state: Secondary | ICD-10-CM | POA: Diagnosis not present

## 2015-10-06 DIAGNOSIS — R748 Abnormal levels of other serum enzymes: Secondary | ICD-10-CM | POA: Diagnosis not present

## 2015-10-06 DIAGNOSIS — E119 Type 2 diabetes mellitus without complications: Secondary | ICD-10-CM | POA: Diagnosis not present

## 2015-10-06 DIAGNOSIS — J449 Chronic obstructive pulmonary disease, unspecified: Secondary | ICD-10-CM | POA: Diagnosis not present

## 2015-10-06 DIAGNOSIS — Z6830 Body mass index (BMI) 30.0-30.9, adult: Secondary | ICD-10-CM | POA: Diagnosis not present

## 2015-10-06 DIAGNOSIS — E669 Obesity, unspecified: Secondary | ICD-10-CM | POA: Diagnosis not present

## 2015-10-06 DIAGNOSIS — I10 Essential (primary) hypertension: Secondary | ICD-10-CM | POA: Diagnosis not present

## 2015-10-06 DIAGNOSIS — Z1231 Encounter for screening mammogram for malignant neoplasm of breast: Secondary | ICD-10-CM | POA: Diagnosis not present

## 2015-10-14 DIAGNOSIS — D473 Essential (hemorrhagic) thrombocythemia: Secondary | ICD-10-CM | POA: Diagnosis not present

## 2015-10-14 DIAGNOSIS — R7989 Other specified abnormal findings of blood chemistry: Secondary | ICD-10-CM | POA: Diagnosis not present

## 2015-10-15 DIAGNOSIS — Z0100 Encounter for examination of eyes and vision without abnormal findings: Secondary | ICD-10-CM | POA: Diagnosis not present

## 2015-10-15 DIAGNOSIS — H52209 Unspecified astigmatism, unspecified eye: Secondary | ICD-10-CM | POA: Diagnosis not present

## 2015-10-15 DIAGNOSIS — H524 Presbyopia: Secondary | ICD-10-CM | POA: Diagnosis not present

## 2015-10-15 DIAGNOSIS — H5203 Hypermetropia, bilateral: Secondary | ICD-10-CM | POA: Diagnosis not present

## 2015-10-28 DIAGNOSIS — Z1231 Encounter for screening mammogram for malignant neoplasm of breast: Secondary | ICD-10-CM | POA: Diagnosis not present

## 2015-10-30 DIAGNOSIS — M1612 Unilateral primary osteoarthritis, left hip: Secondary | ICD-10-CM | POA: Diagnosis not present

## 2015-11-03 DIAGNOSIS — K219 Gastro-esophageal reflux disease without esophagitis: Secondary | ICD-10-CM | POA: Diagnosis not present

## 2015-11-03 DIAGNOSIS — D500 Iron deficiency anemia secondary to blood loss (chronic): Secondary | ICD-10-CM | POA: Diagnosis not present

## 2015-11-03 DIAGNOSIS — K760 Fatty (change of) liver, not elsewhere classified: Secondary | ICD-10-CM | POA: Diagnosis not present

## 2015-11-04 DIAGNOSIS — H6123 Impacted cerumen, bilateral: Secondary | ICD-10-CM | POA: Diagnosis not present

## 2015-11-04 DIAGNOSIS — J309 Allergic rhinitis, unspecified: Secondary | ICD-10-CM | POA: Diagnosis not present

## 2015-11-04 DIAGNOSIS — Z6829 Body mass index (BMI) 29.0-29.9, adult: Secondary | ICD-10-CM | POA: Diagnosis not present

## 2015-11-04 DIAGNOSIS — E663 Overweight: Secondary | ICD-10-CM | POA: Diagnosis not present

## 2015-11-04 DIAGNOSIS — M7062 Trochanteric bursitis, left hip: Secondary | ICD-10-CM | POA: Diagnosis not present

## 2015-11-04 DIAGNOSIS — Z79899 Other long term (current) drug therapy: Secondary | ICD-10-CM | POA: Diagnosis not present

## 2015-11-06 DIAGNOSIS — J301 Allergic rhinitis due to pollen: Secondary | ICD-10-CM | POA: Diagnosis not present

## 2015-11-06 DIAGNOSIS — R5383 Other fatigue: Secondary | ICD-10-CM | POA: Diagnosis not present

## 2015-11-06 DIAGNOSIS — J4520 Mild intermittent asthma, uncomplicated: Secondary | ICD-10-CM | POA: Diagnosis not present

## 2015-11-06 DIAGNOSIS — G4733 Obstructive sleep apnea (adult) (pediatric): Secondary | ICD-10-CM | POA: Diagnosis not present

## 2015-11-11 DIAGNOSIS — M79672 Pain in left foot: Secondary | ICD-10-CM | POA: Diagnosis not present

## 2015-11-18 DIAGNOSIS — G4733 Obstructive sleep apnea (adult) (pediatric): Secondary | ICD-10-CM | POA: Diagnosis not present

## 2015-11-25 DIAGNOSIS — J4520 Mild intermittent asthma, uncomplicated: Secondary | ICD-10-CM | POA: Diagnosis not present

## 2015-11-25 DIAGNOSIS — G4733 Obstructive sleep apnea (adult) (pediatric): Secondary | ICD-10-CM | POA: Diagnosis not present

## 2015-11-25 DIAGNOSIS — J301 Allergic rhinitis due to pollen: Secondary | ICD-10-CM | POA: Diagnosis not present

## 2015-11-25 DIAGNOSIS — R5383 Other fatigue: Secondary | ICD-10-CM | POA: Diagnosis not present

## 2015-12-03 DIAGNOSIS — J309 Allergic rhinitis, unspecified: Secondary | ICD-10-CM | POA: Diagnosis not present

## 2015-12-03 DIAGNOSIS — L299 Pruritus, unspecified: Secondary | ICD-10-CM | POA: Diagnosis not present

## 2015-12-03 DIAGNOSIS — M7061 Trochanteric bursitis, right hip: Secondary | ICD-10-CM | POA: Diagnosis not present

## 2015-12-03 DIAGNOSIS — Z6828 Body mass index (BMI) 28.0-28.9, adult: Secondary | ICD-10-CM | POA: Diagnosis not present

## 2015-12-23 DIAGNOSIS — M79674 Pain in right toe(s): Secondary | ICD-10-CM | POA: Diagnosis not present

## 2016-01-05 DIAGNOSIS — S92511A Displaced fracture of proximal phalanx of right lesser toe(s), initial encounter for closed fracture: Secondary | ICD-10-CM | POA: Diagnosis not present

## 2016-01-11 DIAGNOSIS — J0100 Acute maxillary sinusitis, unspecified: Secondary | ICD-10-CM | POA: Diagnosis not present

## 2016-01-18 DIAGNOSIS — R42 Dizziness and giddiness: Secondary | ICD-10-CM | POA: Diagnosis not present

## 2016-01-18 DIAGNOSIS — Z7189 Other specified counseling: Secondary | ICD-10-CM | POA: Diagnosis not present

## 2016-01-18 DIAGNOSIS — R262 Difficulty in walking, not elsewhere classified: Secondary | ICD-10-CM | POA: Diagnosis not present

## 2016-01-18 DIAGNOSIS — R05 Cough: Secondary | ICD-10-CM | POA: Diagnosis not present

## 2016-01-18 DIAGNOSIS — L299 Pruritus, unspecified: Secondary | ICD-10-CM | POA: Diagnosis not present

## 2016-01-20 DIAGNOSIS — R0602 Shortness of breath: Secondary | ICD-10-CM | POA: Diagnosis not present

## 2016-01-20 DIAGNOSIS — R05 Cough: Secondary | ICD-10-CM | POA: Diagnosis not present

## 2016-01-20 DIAGNOSIS — R079 Chest pain, unspecified: Secondary | ICD-10-CM | POA: Diagnosis not present

## 2016-01-20 DIAGNOSIS — Z780 Asymptomatic menopausal state: Secondary | ICD-10-CM | POA: Diagnosis not present

## 2016-01-20 DIAGNOSIS — M8588 Other specified disorders of bone density and structure, other site: Secondary | ICD-10-CM | POA: Diagnosis not present

## 2016-01-21 DIAGNOSIS — R109 Unspecified abdominal pain: Secondary | ICD-10-CM | POA: Diagnosis not present

## 2016-01-21 DIAGNOSIS — K5909 Other constipation: Secondary | ICD-10-CM | POA: Diagnosis not present

## 2016-01-21 DIAGNOSIS — R2689 Other abnormalities of gait and mobility: Secondary | ICD-10-CM | POA: Diagnosis not present

## 2016-01-21 DIAGNOSIS — B370 Candidal stomatitis: Secondary | ICD-10-CM | POA: Diagnosis not present

## 2016-01-25 DIAGNOSIS — G4733 Obstructive sleep apnea (adult) (pediatric): Secondary | ICD-10-CM | POA: Diagnosis not present

## 2016-01-25 DIAGNOSIS — R5383 Other fatigue: Secondary | ICD-10-CM | POA: Diagnosis not present

## 2016-01-25 DIAGNOSIS — J4530 Mild persistent asthma, uncomplicated: Secondary | ICD-10-CM | POA: Diagnosis not present

## 2016-01-26 DIAGNOSIS — S92511A Displaced fracture of proximal phalanx of right lesser toe(s), initial encounter for closed fracture: Secondary | ICD-10-CM | POA: Diagnosis not present

## 2016-02-04 DIAGNOSIS — G8929 Other chronic pain: Secondary | ICD-10-CM | POA: Diagnosis not present

## 2016-02-04 DIAGNOSIS — Z23 Encounter for immunization: Secondary | ICD-10-CM | POA: Diagnosis not present

## 2016-02-04 DIAGNOSIS — M25559 Pain in unspecified hip: Secondary | ICD-10-CM | POA: Diagnosis not present

## 2016-02-04 DIAGNOSIS — M160 Bilateral primary osteoarthritis of hip: Secondary | ICD-10-CM | POA: Diagnosis not present

## 2016-02-04 DIAGNOSIS — K5909 Other constipation: Secondary | ICD-10-CM | POA: Diagnosis not present

## 2016-02-04 DIAGNOSIS — E559 Vitamin D deficiency, unspecified: Secondary | ICD-10-CM | POA: Diagnosis not present

## 2016-02-10 DIAGNOSIS — J0100 Acute maxillary sinusitis, unspecified: Secondary | ICD-10-CM | POA: Diagnosis not present

## 2016-02-16 DIAGNOSIS — R262 Difficulty in walking, not elsewhere classified: Secondary | ICD-10-CM | POA: Diagnosis not present

## 2016-02-16 DIAGNOSIS — R2681 Unsteadiness on feet: Secondary | ICD-10-CM | POA: Diagnosis not present

## 2016-03-03 DIAGNOSIS — E785 Hyperlipidemia, unspecified: Secondary | ICD-10-CM | POA: Diagnosis not present

## 2016-03-03 DIAGNOSIS — E559 Vitamin D deficiency, unspecified: Secondary | ICD-10-CM | POA: Diagnosis not present

## 2016-03-03 DIAGNOSIS — E119 Type 2 diabetes mellitus without complications: Secondary | ICD-10-CM | POA: Diagnosis not present

## 2016-03-03 DIAGNOSIS — I10 Essential (primary) hypertension: Secondary | ICD-10-CM | POA: Diagnosis not present

## 2016-03-04 DIAGNOSIS — D509 Iron deficiency anemia, unspecified: Secondary | ICD-10-CM | POA: Diagnosis not present

## 2016-03-07 DIAGNOSIS — E119 Type 2 diabetes mellitus without complications: Secondary | ICD-10-CM | POA: Diagnosis not present

## 2016-03-07 DIAGNOSIS — Z6830 Body mass index (BMI) 30.0-30.9, adult: Secondary | ICD-10-CM | POA: Diagnosis not present

## 2016-03-07 DIAGNOSIS — S92511A Displaced fracture of proximal phalanx of right lesser toe(s), initial encounter for closed fracture: Secondary | ICD-10-CM | POA: Diagnosis not present

## 2016-03-07 DIAGNOSIS — E785 Hyperlipidemia, unspecified: Secondary | ICD-10-CM | POA: Diagnosis not present

## 2016-03-07 DIAGNOSIS — I10 Essential (primary) hypertension: Secondary | ICD-10-CM | POA: Diagnosis not present

## 2016-03-18 DIAGNOSIS — J4530 Mild persistent asthma, uncomplicated: Secondary | ICD-10-CM | POA: Diagnosis not present

## 2016-03-18 DIAGNOSIS — G4733 Obstructive sleep apnea (adult) (pediatric): Secondary | ICD-10-CM | POA: Diagnosis not present

## 2016-03-18 DIAGNOSIS — R5383 Other fatigue: Secondary | ICD-10-CM | POA: Diagnosis not present

## 2016-03-18 DIAGNOSIS — J301 Allergic rhinitis due to pollen: Secondary | ICD-10-CM | POA: Diagnosis not present

## 2016-03-28 DIAGNOSIS — R0609 Other forms of dyspnea: Secondary | ICD-10-CM | POA: Diagnosis not present

## 2016-04-04 DIAGNOSIS — S92511K Displaced fracture of proximal phalanx of right lesser toe(s), subsequent encounter for fracture with nonunion: Secondary | ICD-10-CM | POA: Diagnosis not present

## 2016-04-05 DIAGNOSIS — R05 Cough: Secondary | ICD-10-CM | POA: Diagnosis not present

## 2016-04-05 DIAGNOSIS — R079 Chest pain, unspecified: Secondary | ICD-10-CM | POA: Diagnosis not present

## 2016-04-05 DIAGNOSIS — J019 Acute sinusitis, unspecified: Secondary | ICD-10-CM | POA: Diagnosis not present

## 2016-04-05 DIAGNOSIS — J209 Acute bronchitis, unspecified: Secondary | ICD-10-CM | POA: Diagnosis not present

## 2016-04-05 DIAGNOSIS — R0609 Other forms of dyspnea: Secondary | ICD-10-CM | POA: Diagnosis not present

## 2016-04-05 DIAGNOSIS — I700 Atherosclerosis of aorta: Secondary | ICD-10-CM | POA: Diagnosis not present

## 2016-04-07 DIAGNOSIS — E785 Hyperlipidemia, unspecified: Secondary | ICD-10-CM | POA: Diagnosis not present

## 2016-04-07 DIAGNOSIS — E089 Diabetes mellitus due to underlying condition without complications: Secondary | ICD-10-CM | POA: Diagnosis not present

## 2016-04-07 DIAGNOSIS — Z7984 Long term (current) use of oral hypoglycemic drugs: Secondary | ICD-10-CM | POA: Diagnosis not present

## 2016-04-07 DIAGNOSIS — I2510 Atherosclerotic heart disease of native coronary artery without angina pectoris: Secondary | ICD-10-CM | POA: Diagnosis not present

## 2016-04-07 DIAGNOSIS — R0609 Other forms of dyspnea: Secondary | ICD-10-CM | POA: Diagnosis not present

## 2016-04-07 DIAGNOSIS — Z79899 Other long term (current) drug therapy: Secondary | ICD-10-CM | POA: Diagnosis not present

## 2016-04-07 DIAGNOSIS — I10 Essential (primary) hypertension: Secondary | ICD-10-CM | POA: Diagnosis not present

## 2016-04-07 DIAGNOSIS — Z87891 Personal history of nicotine dependence: Secondary | ICD-10-CM | POA: Diagnosis not present

## 2016-04-11 DIAGNOSIS — J209 Acute bronchitis, unspecified: Secondary | ICD-10-CM | POA: Diagnosis not present

## 2016-04-11 DIAGNOSIS — I10 Essential (primary) hypertension: Secondary | ICD-10-CM | POA: Diagnosis not present

## 2016-04-11 DIAGNOSIS — K219 Gastro-esophageal reflux disease without esophagitis: Secondary | ICD-10-CM | POA: Diagnosis not present

## 2016-04-11 DIAGNOSIS — J309 Allergic rhinitis, unspecified: Secondary | ICD-10-CM | POA: Diagnosis not present

## 2016-04-11 DIAGNOSIS — Z6830 Body mass index (BMI) 30.0-30.9, adult: Secondary | ICD-10-CM | POA: Diagnosis not present

## 2016-04-11 DIAGNOSIS — J449 Chronic obstructive pulmonary disease, unspecified: Secondary | ICD-10-CM | POA: Diagnosis not present

## 2016-04-11 DIAGNOSIS — I2510 Atherosclerotic heart disease of native coronary artery without angina pectoris: Secondary | ICD-10-CM | POA: Diagnosis not present

## 2016-04-24 DIAGNOSIS — M25511 Pain in right shoulder: Secondary | ICD-10-CM | POA: Diagnosis not present

## 2016-04-24 DIAGNOSIS — M7552 Bursitis of left shoulder: Secondary | ICD-10-CM | POA: Diagnosis not present

## 2016-04-24 DIAGNOSIS — M542 Cervicalgia: Secondary | ICD-10-CM | POA: Diagnosis not present

## 2016-04-26 DIAGNOSIS — R5383 Other fatigue: Secondary | ICD-10-CM | POA: Diagnosis not present

## 2016-04-26 DIAGNOSIS — J4520 Mild intermittent asthma, uncomplicated: Secondary | ICD-10-CM | POA: Diagnosis not present

## 2016-04-26 DIAGNOSIS — J301 Allergic rhinitis due to pollen: Secondary | ICD-10-CM | POA: Diagnosis not present

## 2016-04-26 DIAGNOSIS — R42 Dizziness and giddiness: Secondary | ICD-10-CM | POA: Diagnosis not present

## 2016-04-28 DIAGNOSIS — E088 Diabetes mellitus due to underlying condition with unspecified complications: Secondary | ICD-10-CM | POA: Diagnosis not present

## 2016-04-28 DIAGNOSIS — I2510 Atherosclerotic heart disease of native coronary artery without angina pectoris: Secondary | ICD-10-CM | POA: Diagnosis not present

## 2016-04-28 DIAGNOSIS — E785 Hyperlipidemia, unspecified: Secondary | ICD-10-CM | POA: Diagnosis not present

## 2016-04-28 DIAGNOSIS — I10 Essential (primary) hypertension: Secondary | ICD-10-CM | POA: Diagnosis not present

## 2016-04-29 DIAGNOSIS — S92511K Displaced fracture of proximal phalanx of right lesser toe(s), subsequent encounter for fracture with nonunion: Secondary | ICD-10-CM | POA: Diagnosis not present

## 2016-05-11 DIAGNOSIS — R42 Dizziness and giddiness: Secondary | ICD-10-CM | POA: Diagnosis not present

## 2016-05-11 DIAGNOSIS — R5383 Other fatigue: Secondary | ICD-10-CM | POA: Diagnosis not present

## 2016-05-16 DIAGNOSIS — E669 Obesity, unspecified: Secondary | ICD-10-CM | POA: Diagnosis not present

## 2016-05-16 DIAGNOSIS — M1990 Unspecified osteoarthritis, unspecified site: Secondary | ICD-10-CM | POA: Diagnosis not present

## 2016-05-16 DIAGNOSIS — Z6830 Body mass index (BMI) 30.0-30.9, adult: Secondary | ICD-10-CM | POA: Diagnosis not present

## 2016-05-16 DIAGNOSIS — S92511K Displaced fracture of proximal phalanx of right lesser toe(s), subsequent encounter for fracture with nonunion: Secondary | ICD-10-CM | POA: Diagnosis not present

## 2016-05-16 DIAGNOSIS — J309 Allergic rhinitis, unspecified: Secondary | ICD-10-CM | POA: Diagnosis not present

## 2016-05-16 DIAGNOSIS — M4692 Unspecified inflammatory spondylopathy, cervical region: Secondary | ICD-10-CM | POA: Diagnosis not present

## 2016-05-16 DIAGNOSIS — E119 Type 2 diabetes mellitus without complications: Secondary | ICD-10-CM | POA: Diagnosis not present

## 2016-06-01 DIAGNOSIS — I6521 Occlusion and stenosis of right carotid artery: Secondary | ICD-10-CM | POA: Diagnosis not present

## 2016-06-01 DIAGNOSIS — J4520 Mild intermittent asthma, uncomplicated: Secondary | ICD-10-CM | POA: Diagnosis not present

## 2016-06-01 DIAGNOSIS — J301 Allergic rhinitis due to pollen: Secondary | ICD-10-CM | POA: Diagnosis not present

## 2016-06-01 DIAGNOSIS — R5383 Other fatigue: Secondary | ICD-10-CM | POA: Diagnosis not present

## 2016-06-02 DIAGNOSIS — R079 Chest pain, unspecified: Secondary | ICD-10-CM | POA: Diagnosis not present

## 2016-06-02 DIAGNOSIS — R7989 Other specified abnormal findings of blood chemistry: Secondary | ICD-10-CM | POA: Diagnosis not present

## 2016-06-02 DIAGNOSIS — R0602 Shortness of breath: Secondary | ICD-10-CM | POA: Diagnosis not present

## 2016-06-02 DIAGNOSIS — I209 Angina pectoris, unspecified: Secondary | ICD-10-CM | POA: Diagnosis not present

## 2016-06-02 DIAGNOSIS — R002 Palpitations: Secondary | ICD-10-CM

## 2016-06-02 DIAGNOSIS — I2510 Atherosclerotic heart disease of native coronary artery without angina pectoris: Secondary | ICD-10-CM | POA: Diagnosis not present

## 2016-06-02 DIAGNOSIS — I10 Essential (primary) hypertension: Secondary | ICD-10-CM | POA: Diagnosis not present

## 2016-06-02 DIAGNOSIS — E088 Diabetes mellitus due to underlying condition with unspecified complications: Secondary | ICD-10-CM | POA: Diagnosis not present

## 2016-06-02 DIAGNOSIS — E785 Hyperlipidemia, unspecified: Secondary | ICD-10-CM | POA: Diagnosis not present

## 2016-06-02 HISTORY — DX: Palpitations: R00.2

## 2016-06-03 DIAGNOSIS — I2510 Atherosclerotic heart disease of native coronary artery without angina pectoris: Secondary | ICD-10-CM | POA: Diagnosis not present

## 2016-06-03 DIAGNOSIS — I10 Essential (primary) hypertension: Secondary | ICD-10-CM | POA: Diagnosis not present

## 2016-06-03 DIAGNOSIS — R002 Palpitations: Secondary | ICD-10-CM | POA: Diagnosis not present

## 2016-06-03 DIAGNOSIS — E785 Hyperlipidemia, unspecified: Secondary | ICD-10-CM | POA: Diagnosis not present

## 2016-06-03 DIAGNOSIS — E088 Diabetes mellitus due to underlying condition with unspecified complications: Secondary | ICD-10-CM | POA: Diagnosis not present

## 2016-06-03 DIAGNOSIS — I209 Angina pectoris, unspecified: Secondary | ICD-10-CM | POA: Diagnosis not present

## 2016-06-10 ENCOUNTER — Encounter: Payer: Medicare PPO | Admitting: Vascular Surgery

## 2016-06-10 DIAGNOSIS — R002 Palpitations: Secondary | ICD-10-CM | POA: Diagnosis not present

## 2016-06-14 DIAGNOSIS — M7552 Bursitis of left shoulder: Secondary | ICD-10-CM | POA: Diagnosis not present

## 2016-06-14 DIAGNOSIS — E785 Hyperlipidemia, unspecified: Secondary | ICD-10-CM | POA: Diagnosis not present

## 2016-06-14 DIAGNOSIS — Z9181 History of falling: Secondary | ICD-10-CM | POA: Diagnosis not present

## 2016-06-14 DIAGNOSIS — E119 Type 2 diabetes mellitus without complications: Secondary | ICD-10-CM | POA: Diagnosis not present

## 2016-06-14 DIAGNOSIS — E559 Vitamin D deficiency, unspecified: Secondary | ICD-10-CM | POA: Diagnosis not present

## 2016-06-14 DIAGNOSIS — D473 Essential (hemorrhagic) thrombocythemia: Secondary | ICD-10-CM | POA: Diagnosis not present

## 2016-06-14 DIAGNOSIS — M4692 Unspecified inflammatory spondylopathy, cervical region: Secondary | ICD-10-CM | POA: Diagnosis not present

## 2016-06-14 DIAGNOSIS — I10 Essential (primary) hypertension: Secondary | ICD-10-CM | POA: Diagnosis not present

## 2016-06-14 DIAGNOSIS — Z79899 Other long term (current) drug therapy: Secondary | ICD-10-CM | POA: Diagnosis not present

## 2016-06-14 DIAGNOSIS — Z6830 Body mass index (BMI) 30.0-30.9, adult: Secondary | ICD-10-CM | POA: Diagnosis not present

## 2016-06-14 DIAGNOSIS — E669 Obesity, unspecified: Secondary | ICD-10-CM | POA: Diagnosis not present

## 2016-06-16 DIAGNOSIS — R002 Palpitations: Secondary | ICD-10-CM | POA: Diagnosis not present

## 2016-06-23 DIAGNOSIS — M7552 Bursitis of left shoulder: Secondary | ICD-10-CM | POA: Diagnosis not present

## 2016-06-23 DIAGNOSIS — R350 Frequency of micturition: Secondary | ICD-10-CM | POA: Diagnosis not present

## 2016-06-23 DIAGNOSIS — Z6830 Body mass index (BMI) 30.0-30.9, adult: Secondary | ICD-10-CM | POA: Diagnosis not present

## 2016-06-23 DIAGNOSIS — R300 Dysuria: Secondary | ICD-10-CM | POA: Diagnosis not present

## 2016-06-23 DIAGNOSIS — M25612 Stiffness of left shoulder, not elsewhere classified: Secondary | ICD-10-CM | POA: Diagnosis not present

## 2016-06-23 DIAGNOSIS — M25512 Pain in left shoulder: Secondary | ICD-10-CM | POA: Diagnosis not present

## 2016-06-28 DIAGNOSIS — J309 Allergic rhinitis, unspecified: Secondary | ICD-10-CM | POA: Diagnosis not present

## 2016-06-28 DIAGNOSIS — J449 Chronic obstructive pulmonary disease, unspecified: Secondary | ICD-10-CM | POA: Diagnosis not present

## 2016-06-28 DIAGNOSIS — M79674 Pain in right toe(s): Secondary | ICD-10-CM | POA: Diagnosis not present

## 2016-06-28 DIAGNOSIS — Z6829 Body mass index (BMI) 29.0-29.9, adult: Secondary | ICD-10-CM | POA: Diagnosis not present

## 2016-06-28 DIAGNOSIS — M7989 Other specified soft tissue disorders: Secondary | ICD-10-CM | POA: Diagnosis not present

## 2016-07-04 DIAGNOSIS — M25512 Pain in left shoulder: Secondary | ICD-10-CM | POA: Diagnosis not present

## 2016-07-04 DIAGNOSIS — M25612 Stiffness of left shoulder, not elsewhere classified: Secondary | ICD-10-CM | POA: Diagnosis not present

## 2016-07-04 DIAGNOSIS — M7552 Bursitis of left shoulder: Secondary | ICD-10-CM | POA: Diagnosis not present

## 2016-07-05 ENCOUNTER — Other Ambulatory Visit: Payer: Self-pay | Admitting: Vascular Surgery

## 2016-07-05 DIAGNOSIS — M79674 Pain in right toe(s): Secondary | ICD-10-CM | POA: Diagnosis not present

## 2016-07-05 DIAGNOSIS — Z1389 Encounter for screening for other disorder: Secondary | ICD-10-CM | POA: Diagnosis not present

## 2016-07-05 DIAGNOSIS — I10 Essential (primary) hypertension: Secondary | ICD-10-CM | POA: Diagnosis not present

## 2016-07-05 DIAGNOSIS — Z6830 Body mass index (BMI) 30.0-30.9, adult: Secondary | ICD-10-CM | POA: Diagnosis not present

## 2016-07-05 DIAGNOSIS — R230 Cyanosis: Secondary | ICD-10-CM

## 2016-07-05 DIAGNOSIS — Z139 Encounter for screening, unspecified: Secondary | ICD-10-CM | POA: Diagnosis not present

## 2016-07-05 DIAGNOSIS — E119 Type 2 diabetes mellitus without complications: Secondary | ICD-10-CM | POA: Diagnosis not present

## 2016-07-08 ENCOUNTER — Encounter: Payer: Self-pay | Admitting: Vascular Surgery

## 2016-07-09 DIAGNOSIS — I10 Essential (primary) hypertension: Secondary | ICD-10-CM | POA: Diagnosis not present

## 2016-07-11 DIAGNOSIS — J309 Allergic rhinitis, unspecified: Secondary | ICD-10-CM | POA: Diagnosis not present

## 2016-07-11 DIAGNOSIS — E669 Obesity, unspecified: Secondary | ICD-10-CM | POA: Diagnosis not present

## 2016-07-11 DIAGNOSIS — I10 Essential (primary) hypertension: Secondary | ICD-10-CM | POA: Diagnosis not present

## 2016-07-11 DIAGNOSIS — K5900 Constipation, unspecified: Secondary | ICD-10-CM | POA: Diagnosis not present

## 2016-07-11 DIAGNOSIS — R102 Pelvic and perineal pain: Secondary | ICD-10-CM | POA: Diagnosis not present

## 2016-07-11 DIAGNOSIS — Z6829 Body mass index (BMI) 29.0-29.9, adult: Secondary | ICD-10-CM | POA: Diagnosis not present

## 2016-07-15 ENCOUNTER — Ambulatory Visit (INDEPENDENT_AMBULATORY_CARE_PROVIDER_SITE_OTHER): Payer: Medicare HMO | Admitting: Vascular Surgery

## 2016-07-15 ENCOUNTER — Encounter: Payer: Self-pay | Admitting: Vascular Surgery

## 2016-07-15 ENCOUNTER — Encounter: Payer: Medicare PPO | Admitting: Vascular Surgery

## 2016-07-15 ENCOUNTER — Encounter (HOSPITAL_COMMUNITY): Payer: Medicare PPO

## 2016-07-15 ENCOUNTER — Ambulatory Visit (HOSPITAL_COMMUNITY)
Admission: RE | Admit: 2016-07-15 | Discharge: 2016-07-15 | Disposition: A | Discharge status: Home / Self Care | Payer: Medicare HMO | Source: Ambulatory Visit | Attending: Vascular Surgery | Admitting: Vascular Surgery

## 2016-07-15 VITALS — BP 144/89 | HR 83 | Temp 98.3°F | Resp 16 | Ht 65.5 in | Wt 183.8 lb

## 2016-07-15 DIAGNOSIS — R230 Cyanosis: Secondary | ICD-10-CM

## 2016-07-15 DIAGNOSIS — M79674 Pain in right toe(s): Secondary | ICD-10-CM | POA: Insufficient documentation

## 2016-07-15 DIAGNOSIS — M7989 Other specified soft tissue disorders: Secondary | ICD-10-CM

## 2016-07-15 HISTORY — DX: Pain in right toe(s): M79.674

## 2016-07-15 HISTORY — DX: Other specified soft tissue disorders: M79.89

## 2016-07-15 NOTE — Progress Notes (Signed)
Patient ID: Deborah Hutchinson, female   DOB: 1945-09-17, 71 y.o.   MRN: [id number]  Reason for Consult: New Evaluation (referred by Dr. Rica Records for pain, swelling, bluish discoloration right great toe )   Referred by Marco Collie, MD  Subjective:     HPI:  Deborah Hutchinson is a 71 y.o. female former smoker with diabetes and hypertension. She is in for evaluation of right great toe pain. She states that she has had pain there since having a toenail extracted 2 years ago. She is not had ulceration of her other toes. The toenail was removed for ingrown toenail. She does not have pain with walking in her calves to suggest claudication. She does not note alleviating or exacerbating factors other than walking does hurt the toe. She has never been diagnosed with vascular disease but does have a diagnosis of COPD. Her chief limitation to walking is shortness of breath. She does not currently have a podiatrist. She is never had stroke or TIA symptoms or amaurosis. She has not had fevers chills or other constitutional symptoms.  Past Medical History:  Diagnosis Date  . Angina    normal stress test  . Bronchitis   . Diabetes mellitus   . GERD (gastroesophageal reflux disease)   . H/O hiatal hernia   . Heart murmur   . Hyperlipemia   . Hypertension   . Shortness of breath    on  excertion   No family history on file. Past Surgical History:  Procedure Laterality Date  . ABDOMINAL HYSTERECTOMY    . BREAST SURGERY     begin mass,left  breast  . DILATION AND CURETTAGE OF UTERUS     one  . EUS  05/19/2011   Procedure: UPPER ENDOSCOPIC ULTRASOUND (EUS) LINEAR;  Surgeon: Owens Loffler, MD;  Location: [facility];  Service: Endoscopy;  Laterality: N/A;  radial linear   . KNEE SURGERY    . right thumb     tendon repaire  . SHOULDER SURGERY     rotator cuff repair  . WRIST SURGERY     rigth    Short Social History:  Social History  Substance Use Topics  . Smoking status: Former Smoker   Packs/day: 1.00    Quit date: 05/17/2011  . Smokeless tobacco: Never Used  . Alcohol use No    Allergies  Allergen Reactions  . Penicillins Rash    Reaction was 30years ago  Has never taken again    Current Outpatient Prescriptions  Medication Sig Dispense Refill  . ACCU-CHEK SMARTVIEW test strip     . Albuterol (VENTOLIN IN) Inhale into the lungs as needed.    Marland Kitchen amLODipine (NORVASC) 10 MG tablet Take 10 mg by mouth daily.    Marland Kitchen aspirin 81 MG tablet Take 160 mg by mouth daily.      . BD PEN NEEDLE NANO U/F 32G X 4 MM MISC     . escitalopram (LEXAPRO) 10 MG tablet Take 10 mg by mouth daily.    . fluticasone (VERAMYST) 27.5 MCG/SPRAY nasal spray Place 2 sprays into the nose daily.    . Lancets (ACCU-CHEK MULTICLIX) lancets     . metFORMIN (GLUCOPHAGE) 500 MG tablet Take 500 mg by mouth. Take 1.5 mg daily    . Multiple Vitamins-Iron (MULTIVITAMIN/IRON PO) Take by mouth.    Marland Kitchen omeprazole (PRILOSEC) 20 MG capsule Take 20 mg by mouth daily.      . simvastatin (ZOCOR) 20 MG tablet Take 20 mg by  mouth every evening.      Marland Kitchen amLODipine-benazepril (LOTREL) 10-20 MG per capsule Take 1 capsule by mouth daily.      . Cholecalciferol (VITAMIN D PO) Take by mouth.    . cloNIDine (CATAPRES) 0.1 MG tablet Take 0.1 mg by mouth 2 (two) times daily.      Marland Kitchen doxycycline (VIBRAMYCIN) 100 MG capsule     . estradiol (ESTRACE) 0.5 MG tablet Take 0.5 mg by mouth daily.    . fluticasone (FLONASE) 50 MCG/ACT nasal spray     . hydrochlorothiazide (HYDRODIURIL) 25 MG tablet Take 25 mg by mouth daily.      . isosorbide mononitrate (IMDUR) 30 MG 24 hr tablet     . meloxicam (MOBIC) 7.5 MG tablet Take 7.5 mg by mouth daily.    . Multiple Vitamin (MULTIVITAMIN) capsule Take 1 capsule by mouth daily.      Marland Kitchen PROAIR HFA 108 (90 BASE) MCG/ACT inhaler     . ranitidine (ZANTAC) 300 MG tablet     . sertraline (ZOLOFT) 50 MG tablet     . sulfamethoxazole-trimethoprim (BACTRIM DS) 800-160 MG per tablet     . VICTOZA 18  MG/3ML SOPN      No current facility-administered medications for this visit.     Review of Systems  Constitutional:  Constitutional negative. HENT: HENT negative.  Eyes: Eyes negative.  Respiratory: Positive for shortness of breath.  Cardiovascular: Positive for chest pain, dyspnea with exertion and irregular heartbeat.  GI: Gastrointestinal negative.  Musculoskeletal:       Pain in right great toe Skin: Skin negative.  Neurological: Neurological negative. Psychiatric: Psychiatric negative.        Objective:  Objective   Vitals:   07/15/16 1140 07/15/16 1200  BP: (!) 165/100 (!) 144/89  Pulse: 83   Resp: 16   Temp: 98.3 F (36.8 C)   TempSrc: Oral   SpO2: 97%   Weight: 183 lb 12.8 oz (83.4 kg)   Height: 5' 5.5" (1.664 m)    Body mass index is 30.12 kg/m.  Physical Exam  Constitutional: She appears well-developed.  HENT:  Head: Normocephalic.  Eyes: Pupils are equal, round, and reactive to light.  Neck: Normal range of motion.  Cardiovascular:  Pulses:      Carotid pulses are 2+ on the right side, and 2+ on the left side.      Femoral pulses are 2+ on the right side, and 2+ on the left side.      Dorsalis pedis pulses are 2+ on the right side, and 2+ on the left side.  Pulmonary/Chest: Effort normal.  Abdominal: Soft. She exhibits no mass.  Musculoskeletal: Normal range of motion.  Right great toenail bed is healthy, most of nail is missing  Lymphadenopathy:    She has no cervical adenopathy.  Neurological: She is alert. A cranial nerve deficit is present.  Psychiatric: She has a normal mood and affect. Her behavior is normal. Thought content normal.    Data: ABIs are greater than 1 bilaterally with triphasic waveforms. No significant evidence of arterial disease in either the right or left lower extremity.     Assessment/Plan:     71 year old female here for vascular evaluation for right great toe pain. 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 We will refer her to a podiatrist  in [place] take care of her toe she can follow-up on a when necessary basis here.     Waynetta Sandy MD [facility]

## 2016-07-15 NOTE — Progress Notes (Signed)
Vitals:   07/15/16 1140 07/15/16 1200  BP: (!) 165/100 (!) 144/89  Pulse: 83   Resp: 16   Temp: 98.3 F (36.8 C)   TempSrc: Oral   SpO2: 97%   Weight: 183 lb 12.8 oz (83.4 kg)   Height: 5' 5.5" (1.664 m)

## 2016-07-17 DIAGNOSIS — I10 Essential (primary) hypertension: Secondary | ICD-10-CM | POA: Diagnosis not present

## 2016-07-17 DIAGNOSIS — R51 Headache: Secondary | ICD-10-CM | POA: Diagnosis not present

## 2016-07-17 DIAGNOSIS — R220 Localized swelling, mass and lump, head: Secondary | ICD-10-CM | POA: Diagnosis not present

## 2016-07-17 DIAGNOSIS — K0510 Chronic gingivitis, plaque induced: Secondary | ICD-10-CM | POA: Diagnosis not present

## 2016-07-17 DIAGNOSIS — R0600 Dyspnea, unspecified: Secondary | ICD-10-CM | POA: Diagnosis not present

## 2016-07-19 ENCOUNTER — Encounter: Payer: Self-pay | Admitting: Specialist

## 2016-07-20 DIAGNOSIS — I10 Essential (primary) hypertension: Secondary | ICD-10-CM | POA: Diagnosis not present

## 2016-07-20 DIAGNOSIS — Z09 Encounter for follow-up examination after completed treatment for conditions other than malignant neoplasm: Secondary | ICD-10-CM | POA: Diagnosis not present

## 2016-07-20 DIAGNOSIS — Z6830 Body mass index (BMI) 30.0-30.9, adult: Secondary | ICD-10-CM | POA: Diagnosis not present

## 2016-07-20 DIAGNOSIS — K0510 Chronic gingivitis, plaque induced: Secondary | ICD-10-CM | POA: Diagnosis not present

## 2016-07-20 DIAGNOSIS — J309 Allergic rhinitis, unspecified: Secondary | ICD-10-CM | POA: Diagnosis not present

## 2016-07-20 DIAGNOSIS — E669 Obesity, unspecified: Secondary | ICD-10-CM | POA: Diagnosis not present

## 2016-07-20 DIAGNOSIS — K1120 Sialoadenitis, unspecified: Secondary | ICD-10-CM | POA: Diagnosis not present

## 2016-07-25 DIAGNOSIS — M7552 Bursitis of left shoulder: Secondary | ICD-10-CM | POA: Diagnosis not present

## 2016-07-25 DIAGNOSIS — M25512 Pain in left shoulder: Secondary | ICD-10-CM | POA: Diagnosis not present

## 2016-07-25 DIAGNOSIS — M25612 Stiffness of left shoulder, not elsewhere classified: Secondary | ICD-10-CM | POA: Diagnosis not present

## 2016-07-27 ENCOUNTER — Encounter: Payer: Medicare PPO | Admitting: Surgery

## 2016-07-27 ENCOUNTER — Encounter (HOSPITAL_COMMUNITY): Payer: Medicare PPO

## 2016-08-10 DIAGNOSIS — M1990 Unspecified osteoarthritis, unspecified site: Secondary | ICD-10-CM | POA: Diagnosis not present

## 2016-08-10 DIAGNOSIS — E785 Hyperlipidemia, unspecified: Secondary | ICD-10-CM | POA: Diagnosis not present

## 2016-08-10 DIAGNOSIS — J309 Allergic rhinitis, unspecified: Secondary | ICD-10-CM | POA: Diagnosis not present

## 2016-08-10 DIAGNOSIS — E119 Type 2 diabetes mellitus without complications: Secondary | ICD-10-CM | POA: Diagnosis not present

## 2016-08-10 DIAGNOSIS — E559 Vitamin D deficiency, unspecified: Secondary | ICD-10-CM | POA: Diagnosis not present

## 2016-08-10 DIAGNOSIS — G4700 Insomnia, unspecified: Secondary | ICD-10-CM | POA: Diagnosis not present

## 2016-08-10 DIAGNOSIS — I2510 Atherosclerotic heart disease of native coronary artery without angina pectoris: Secondary | ICD-10-CM | POA: Diagnosis not present

## 2016-08-10 DIAGNOSIS — J449 Chronic obstructive pulmonary disease, unspecified: Secondary | ICD-10-CM | POA: Diagnosis not present

## 2016-08-10 DIAGNOSIS — I10 Essential (primary) hypertension: Secondary | ICD-10-CM | POA: Diagnosis not present

## 2016-08-11 DIAGNOSIS — E785 Hyperlipidemia, unspecified: Secondary | ICD-10-CM | POA: Diagnosis not present

## 2016-08-11 DIAGNOSIS — I2510 Atherosclerotic heart disease of native coronary artery without angina pectoris: Secondary | ICD-10-CM | POA: Diagnosis not present

## 2016-08-11 DIAGNOSIS — I10 Essential (primary) hypertension: Secondary | ICD-10-CM | POA: Diagnosis not present

## 2016-08-11 DIAGNOSIS — E088 Diabetes mellitus due to underlying condition with unspecified complications: Secondary | ICD-10-CM | POA: Diagnosis not present

## 2016-08-24 DIAGNOSIS — J301 Allergic rhinitis due to pollen: Secondary | ICD-10-CM | POA: Diagnosis not present

## 2016-08-24 DIAGNOSIS — G4733 Obstructive sleep apnea (adult) (pediatric): Secondary | ICD-10-CM | POA: Diagnosis not present

## 2016-08-24 DIAGNOSIS — R5383 Other fatigue: Secondary | ICD-10-CM | POA: Diagnosis not present

## 2016-08-24 DIAGNOSIS — J4520 Mild intermittent asthma, uncomplicated: Secondary | ICD-10-CM | POA: Diagnosis not present

## 2016-08-24 LAB — PULMONARY FUNCTION TEST

## 2016-09-02 DIAGNOSIS — D473 Essential (hemorrhagic) thrombocythemia: Secondary | ICD-10-CM | POA: Diagnosis not present

## 2016-09-02 DIAGNOSIS — D509 Iron deficiency anemia, unspecified: Secondary | ICD-10-CM | POA: Diagnosis not present

## 2016-09-07 DIAGNOSIS — D473 Essential (hemorrhagic) thrombocythemia: Secondary | ICD-10-CM | POA: Diagnosis not present

## 2016-09-14 DIAGNOSIS — D509 Iron deficiency anemia, unspecified: Secondary | ICD-10-CM | POA: Diagnosis not present

## 2016-09-27 DIAGNOSIS — R5383 Other fatigue: Secondary | ICD-10-CM | POA: Diagnosis not present

## 2016-09-27 DIAGNOSIS — G4733 Obstructive sleep apnea (adult) (pediatric): Secondary | ICD-10-CM | POA: Diagnosis not present

## 2016-09-27 DIAGNOSIS — J4520 Mild intermittent asthma, uncomplicated: Secondary | ICD-10-CM | POA: Diagnosis not present

## 2016-09-27 DIAGNOSIS — J301 Allergic rhinitis due to pollen: Secondary | ICD-10-CM | POA: Diagnosis not present

## 2016-10-05 DIAGNOSIS — G4733 Obstructive sleep apnea (adult) (pediatric): Secondary | ICD-10-CM | POA: Diagnosis not present

## 2016-10-05 DIAGNOSIS — J301 Allergic rhinitis due to pollen: Secondary | ICD-10-CM | POA: Diagnosis not present

## 2016-10-05 DIAGNOSIS — J4520 Mild intermittent asthma, uncomplicated: Secondary | ICD-10-CM | POA: Diagnosis not present

## 2016-10-05 DIAGNOSIS — R5383 Other fatigue: Secondary | ICD-10-CM | POA: Diagnosis not present

## 2016-10-06 DIAGNOSIS — E119 Type 2 diabetes mellitus without complications: Secondary | ICD-10-CM | POA: Diagnosis not present

## 2016-10-13 DIAGNOSIS — S46912A Strain of unspecified muscle, fascia and tendon at shoulder and upper arm level, left arm, initial encounter: Secondary | ICD-10-CM | POA: Diagnosis not present

## 2016-10-14 DIAGNOSIS — Z136 Encounter for screening for cardiovascular disorders: Secondary | ICD-10-CM | POA: Diagnosis not present

## 2016-10-14 DIAGNOSIS — Z0000 Encounter for general adult medical examination without abnormal findings: Secondary | ICD-10-CM | POA: Diagnosis not present

## 2016-10-14 DIAGNOSIS — E785 Hyperlipidemia, unspecified: Secondary | ICD-10-CM | POA: Diagnosis not present

## 2016-10-14 DIAGNOSIS — Z6830 Body mass index (BMI) 30.0-30.9, adult: Secondary | ICD-10-CM | POA: Diagnosis not present

## 2016-10-14 DIAGNOSIS — Z9181 History of falling: Secondary | ICD-10-CM | POA: Diagnosis not present

## 2016-10-14 DIAGNOSIS — Z1389 Encounter for screening for other disorder: Secondary | ICD-10-CM | POA: Diagnosis not present

## 2016-10-18 DIAGNOSIS — E119 Type 2 diabetes mellitus without complications: Secondary | ICD-10-CM | POA: Diagnosis not present

## 2016-10-18 DIAGNOSIS — H524 Presbyopia: Secondary | ICD-10-CM | POA: Diagnosis not present

## 2016-10-20 DIAGNOSIS — M545 Low back pain: Secondary | ICD-10-CM | POA: Diagnosis not present

## 2016-10-20 DIAGNOSIS — M7552 Bursitis of left shoulder: Secondary | ICD-10-CM | POA: Diagnosis not present

## 2016-10-20 DIAGNOSIS — Z6830 Body mass index (BMI) 30.0-30.9, adult: Secondary | ICD-10-CM | POA: Diagnosis not present

## 2016-10-20 DIAGNOSIS — J309 Allergic rhinitis, unspecified: Secondary | ICD-10-CM | POA: Diagnosis not present

## 2016-10-20 DIAGNOSIS — E669 Obesity, unspecified: Secondary | ICD-10-CM | POA: Diagnosis not present

## 2016-10-20 DIAGNOSIS — M7062 Trochanteric bursitis, left hip: Secondary | ICD-10-CM | POA: Diagnosis not present

## 2016-10-26 DIAGNOSIS — J309 Allergic rhinitis, unspecified: Secondary | ICD-10-CM | POA: Diagnosis not present

## 2016-10-26 DIAGNOSIS — K649 Unspecified hemorrhoids: Secondary | ICD-10-CM | POA: Diagnosis not present

## 2016-10-26 DIAGNOSIS — Z6830 Body mass index (BMI) 30.0-30.9, adult: Secondary | ICD-10-CM | POA: Diagnosis not present

## 2016-10-26 DIAGNOSIS — M7552 Bursitis of left shoulder: Secondary | ICD-10-CM | POA: Diagnosis not present

## 2016-10-27 DIAGNOSIS — M25512 Pain in left shoulder: Secondary | ICD-10-CM | POA: Diagnosis not present

## 2016-10-28 DIAGNOSIS — Z1231 Encounter for screening mammogram for malignant neoplasm of breast: Secondary | ICD-10-CM | POA: Diagnosis not present

## 2016-11-01 DIAGNOSIS — M7582 Other shoulder lesions, left shoulder: Secondary | ICD-10-CM | POA: Diagnosis not present

## 2016-11-01 DIAGNOSIS — M25412 Effusion, left shoulder: Secondary | ICD-10-CM | POA: Diagnosis not present

## 2016-11-02 DIAGNOSIS — M25512 Pain in left shoulder: Secondary | ICD-10-CM | POA: Diagnosis not present

## 2016-11-02 DIAGNOSIS — M19012 Primary osteoarthritis, left shoulder: Secondary | ICD-10-CM | POA: Diagnosis not present

## 2016-11-16 DIAGNOSIS — G4733 Obstructive sleep apnea (adult) (pediatric): Secondary | ICD-10-CM | POA: Diagnosis not present

## 2016-11-23 DIAGNOSIS — G4733 Obstructive sleep apnea (adult) (pediatric): Secondary | ICD-10-CM | POA: Diagnosis not present

## 2016-11-23 DIAGNOSIS — J301 Allergic rhinitis due to pollen: Secondary | ICD-10-CM | POA: Diagnosis not present

## 2016-11-23 DIAGNOSIS — J4520 Mild intermittent asthma, uncomplicated: Secondary | ICD-10-CM | POA: Diagnosis not present

## 2016-11-23 DIAGNOSIS — R5383 Other fatigue: Secondary | ICD-10-CM | POA: Diagnosis not present

## 2016-11-24 DIAGNOSIS — R51 Headache: Secondary | ICD-10-CM | POA: Diagnosis not present

## 2016-11-24 DIAGNOSIS — J309 Allergic rhinitis, unspecified: Secondary | ICD-10-CM | POA: Diagnosis not present

## 2016-11-24 DIAGNOSIS — R079 Chest pain, unspecified: Secondary | ICD-10-CM | POA: Diagnosis not present

## 2016-11-24 DIAGNOSIS — E669 Obesity, unspecified: Secondary | ICD-10-CM | POA: Diagnosis not present

## 2016-11-24 DIAGNOSIS — Z6830 Body mass index (BMI) 30.0-30.9, adult: Secondary | ICD-10-CM | POA: Diagnosis not present

## 2016-11-24 DIAGNOSIS — H68001 Unspecified Eustachian salpingitis, right ear: Secondary | ICD-10-CM | POA: Diagnosis not present

## 2016-11-30 DIAGNOSIS — G4733 Obstructive sleep apnea (adult) (pediatric): Secondary | ICD-10-CM | POA: Diagnosis not present

## 2016-12-01 ENCOUNTER — Ambulatory Visit (INDEPENDENT_AMBULATORY_CARE_PROVIDER_SITE_OTHER): Payer: Medicare HMO | Admitting: Cardiology

## 2016-12-01 ENCOUNTER — Encounter: Payer: Self-pay | Admitting: Cardiology

## 2016-12-01 VITALS — BP 120/78 | HR 87 | Ht 65.5 in | Wt 181.0 lb

## 2016-12-01 DIAGNOSIS — E785 Hyperlipidemia, unspecified: Secondary | ICD-10-CM | POA: Diagnosis not present

## 2016-12-01 DIAGNOSIS — E088 Diabetes mellitus due to underlying condition with unspecified complications: Secondary | ICD-10-CM | POA: Diagnosis not present

## 2016-12-01 DIAGNOSIS — R7989 Other specified abnormal findings of blood chemistry: Secondary | ICD-10-CM

## 2016-12-01 DIAGNOSIS — I10 Essential (primary) hypertension: Secondary | ICD-10-CM | POA: Diagnosis not present

## 2016-12-01 DIAGNOSIS — I2510 Atherosclerotic heart disease of native coronary artery without angina pectoris: Secondary | ICD-10-CM | POA: Diagnosis not present

## 2016-12-01 NOTE — Progress Notes (Signed)
Cardiology Office Note:    Date:  12/01/2016   ID:  Deborah Hutchinson, DOB April 02, 1946, MRN [id number]  PCP:  Deborah Johns, MD  Cardiologist:  Deborah Lindau, MD   Referring MD: Deborah Johns, MD    ASSESSMENT:    1. Dyslipidemia   2. Coronary artery disease involving native coronary artery of native heart without angina pectoris   3. Essential hypertension   4. Diabetes mellitus due to underlying condition with complication, without long-term current use of insulin (HCC)    PLAN:    In order of problems listed above:  1. Secondary prevention stressed. Importance of compliance with diet and medications stressed and he vocalized understanding. Importance of regular exercise stressed. In view of her chest. I reassured her in view of the findings of the recent coronary angiography. I will obtain a d-dimer today. This will help me assess for any other causes of chest pain such as pulmonary thromboembolism though this looks unlikely. Her blood pressure stable.  2. Diet was discussed with dyslipidemia and diabetes mellitus. Lipids are followed by primary care physician. She will be seen in follow-up appointment in 6 months or earlier if she has any concerns.   Medication Adjustments/Labs and Tests Ordered: Current medicines are reviewed at length with the patient today.  Concerns regarding medicines are outlined above.  Orders Placed This Encounter  Procedures  . D-Dimer, Quantitative  . EKG 12-Lead   No orders of the defined types were placed in this encounter.    History of Present Illness:    Deborah Hutchinson is a 71 y.o. female who is being seen today for the evaluation of Chest pain at the request of Deborah Johns, MD. Patient is a pleasant 71 year old female. I have taken care of her in my previous practice. She is now here to see me in follow-up. She is transferred her care to this practice. She mentions to me that she gets chest pain at times and this is not related to exertion. No  orthopnea or PND. She has multiple risk factors for coronary artery disease. This does not come on exertion. Interestingly I evaluated the patient and she underwent coronary angiography a few months ago and this revealed nonobstructive disease. She is accompanied by her daughter. At the time of my evaluation she is alert awake oriented and in no distress.  Past Medical History:  Diagnosis Date  . Angina    normal stress test  . Bronchitis   . 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 Diabetes mellitus   . Diabetes mellitus due to underlying condition with unspecified complications ([place]) 09/12/4330  . Dyslipidemia 06/10/2015  . Essential hypertension 06/10/2015  . GERD (gastroesophageal reflux disease)   . H/O hiatal hernia   . Heart murmur   . Hyperlipemia   . Hypertension   . Nonspecific (abnormal) findings on radiological and other examination of gastrointestinal tract 05/19/2011  . Pain and swelling of toe of right foot 07/15/2016  . Palpitations 06/02/2016  . Shortness of breath    on  excertion    Past Surgical History:  Procedure Laterality Date  . ABDOMINAL HYSTERECTOMY    . ABDOMINAL HYSTERECTOMY    . BREAST SURGERY     begin mass,left  breast  . DILATION AND CURETTAGE OF UTERUS     one  . EUS  05/19/2011   Procedure: UPPER ENDOSCOPIC ULTRASOUND (EUS) LINEAR;  Surgeon: Deborah Loffler, MD;  Location: [facility];  Service: Endoscopy;  Laterality: N/A;  radial linear   . KNEE SURGERY    . LEFT HEART CATH AND CORONARY ANGIOGRAPHY    . right thumb     tendon repaire  . SHOULDER SURGERY     rotator cuff repair  . WRIST SURGERY     rigth    Current Medications: Current Meds  Medication Sig  . Albuterol (VENTOLIN IN) Inhale 2 puffs into the lungs every 6 (six) hours as needed (wheezing).   Marland Kitchen amLODipine (NORVASC) 10 MG tablet Take 10 mg by mouth daily.  Marland Kitchen aspirin 81 MG tablet Take 81 mg by mouth daily.   . BD  PEN NEEDLE NANO U/F 32G X 4 MM MISC   . Cholecalciferol (VITAMIN D PO) Take 5,000 Units by mouth 2 (two) times daily.   Marland Kitchen EPIPEN 2-PAK 0.3 MG/0.3ML SOAJ injection   . escitalopram (LEXAPRO) 10 MG tablet Take 10 mg by mouth daily.  . fluticasone (FLONASE) 50 MCG/ACT nasal spray   . fluticasone (VERAMYST) 27.5 MCG/SPRAY nasal spray Place 2 sprays into the nose daily.  . fluticasone furoate-vilanterol (BREO ELLIPTA) 200-25 MCG/INH AEPB Inhale 1 puff into the lungs daily as needed for wheezing.  . hydrALAZINE (APRESOLINE) 25 MG tablet Take 37.5 mg by mouth 3 (three) times daily.  . isosorbide mononitrate (IMDUR) 60 MG 24 hr tablet Take 60 mg by mouth daily.  . Lancets (ACCU-CHEK MULTICLIX) lancets   . losartan-hydrochlorothiazide (HYZAAR) 100-25 MG tablet Take 1 tablet by mouth daily.  . metFORMIN (GLUCOPHAGE) 500 MG tablet Take 500 mg by mouth 2 (two) times daily with a meal.   . methocarbamol (ROBAXIN) 500 MG tablet Take 500 mg by mouth 2 (two) times daily.  . montelukast (SINGULAIR) 10 MG tablet Take 10 mg by mouth daily.  . Multiple Vitamin (MULTIVITAMIN) capsule Take 1 capsule by mouth daily.    . nitroGLYCERIN (NITROSTAT) 0.4 MG SL tablet Place 1 tablet under the tongue every 5 (five) minutes x 3 doses as needed for chest pain.  Marland Kitchen omeprazole (PRILOSEC) 20 MG capsule Take 20 mg by mouth 2 (two) times daily before a meal.   . simvastatin (ZOCOR) 20 MG tablet Take 20 mg by mouth every evening.    . Tiotropium Bromide Monohydrate (SPIRIVA RESPIMAT) 1.25 MCG/ACT AERS Inhale 2 puffs into the lungs daily.  . traMADol (ULTRAM) 50 MG tablet Take 50 mg by mouth 2 (two) times daily as needed for pain.  Marland Kitchen VICTOZA 18 MG/3ML SOPN 12 mg daily.      Allergies:   Penicillins   Social History   Social History  . Marital status: Single    Spouse name: N/A  . Number of children: N/A  . Years of education: N/A   Social History Main Topics  . Smoking status: Former Smoker    Packs/day: 1.00    Quit  date: 05/17/2011  . Smokeless tobacco: Never Used  . Alcohol use No  . Drug use: No  . Sexual activity: No   Other Topics Concern  . None   Social History Narrative  . None     Family History: The patient's family history includes Diabetes in her sister; Hypertension in her daughter.  ROS:   Please see the history of present illness.    All other systems reviewed and are negative.  EKGs/Labs/Other Studies Reviewed:    The following studies were reviewed today: I reviewed records from my previous practice. Hospital records were reviewed and coronary angiography report  was discussed with her at extensive length. She and her daughter had multiple questions which were answered to their satisfaction.   Recent Labs: No results found for requested labs within last 8760 hours.  Recent Lipid Panel No results found for: CHOL, TRIG, HDL, CHOLHDL, VLDL, LDLCALC, LDLDIRECT  Physical Exam:    VS:  BP 120/78   Pulse 87   Ht 5' 5.5" (1.664 m)   Wt 181 lb (82.1 kg)   SpO2 98%   BMI 29.66 kg/m     Wt Readings from Last 3 Encounters:  12/01/16 181 lb (82.1 kg)  07/15/16 183 lb 12.8 oz (83.4 kg)  05/19/11 198 lb (89.8 kg)     GEN: Patient is in no acute distress HEENT: Normal NECK: No JVD; No carotid bruits LYMPHATICS: No lymphadenopathy CARDIAC: S1 S2 regular, 2/6 systolic murmur at the apex. RESPIRATORY:  Clear to auscultation without rales, wheezing or rhonchi  ABDOMEN: Soft, non-tender, non-distended MUSCULOSKELETAL:  No edema; No deformity  SKIN: Warm and dry NEUROLOGIC:  Alert and oriented x 3 PSYCHIATRIC:  Normal affect    Signed, Deborah Lindau, MD  12/01/2016 12:12 PM    [place]

## 2016-12-01 NOTE — Patient Instructions (Signed)
Medication Instructions:  Your physician recommends that you continue on your current medications as directed. Please refer to the Current Medication list given to you today.   Labwork: Your physician recommends that you return for lab work in: Today- D-dimer  Testing/Procedures: NONE  Follow-Up: Your physician recommends that you schedule a follow-up appointment in: 6 months with Dr. Geraldo Pitter   Any Other Special Instructions Will Be Listed Below (If Applicable).     If you need a refill on your cardiac medications before your next appointment, please call your pharmacy.

## 2016-12-02 LAB — D-DIMER, QUANTITATIVE (NOT AT ARMC): D-DIMER: 0.5 mg{FEU}/L — AB (ref 0.00–0.49)

## 2016-12-02 NOTE — Addendum Note (Signed)
Addended by: Warner Mccreedy E on: 12/02/2016 12:36 PM   Modules accepted: Orders

## 2016-12-02 NOTE — Addendum Note (Signed)
Addended by: Warner Mccreedy E on: 12/02/2016 12:37 PM   Modules accepted: Orders

## 2016-12-05 DIAGNOSIS — R7989 Other specified abnormal findings of blood chemistry: Secondary | ICD-10-CM | POA: Diagnosis not present

## 2016-12-05 DIAGNOSIS — R0602 Shortness of breath: Secondary | ICD-10-CM | POA: Diagnosis not present

## 2016-12-08 DIAGNOSIS — D509 Iron deficiency anemia, unspecified: Secondary | ICD-10-CM | POA: Diagnosis not present

## 2016-12-08 DIAGNOSIS — R51 Headache: Secondary | ICD-10-CM | POA: Diagnosis not present

## 2016-12-26 DIAGNOSIS — J301 Allergic rhinitis due to pollen: Secondary | ICD-10-CM | POA: Diagnosis not present

## 2016-12-26 DIAGNOSIS — R5383 Other fatigue: Secondary | ICD-10-CM | POA: Diagnosis not present

## 2016-12-26 DIAGNOSIS — G4733 Obstructive sleep apnea (adult) (pediatric): Secondary | ICD-10-CM | POA: Diagnosis not present

## 2016-12-26 DIAGNOSIS — J4520 Mild intermittent asthma, uncomplicated: Secondary | ICD-10-CM | POA: Diagnosis not present

## 2016-12-28 DIAGNOSIS — J449 Chronic obstructive pulmonary disease, unspecified: Secondary | ICD-10-CM | POA: Diagnosis not present

## 2016-12-28 DIAGNOSIS — Z6830 Body mass index (BMI) 30.0-30.9, adult: Secondary | ICD-10-CM | POA: Diagnosis not present

## 2016-12-28 DIAGNOSIS — K219 Gastro-esophageal reflux disease without esophagitis: Secondary | ICD-10-CM | POA: Diagnosis not present

## 2016-12-28 DIAGNOSIS — E785 Hyperlipidemia, unspecified: Secondary | ICD-10-CM | POA: Diagnosis not present

## 2016-12-28 DIAGNOSIS — I2510 Atherosclerotic heart disease of native coronary artery without angina pectoris: Secondary | ICD-10-CM | POA: Diagnosis not present

## 2016-12-28 DIAGNOSIS — J309 Allergic rhinitis, unspecified: Secondary | ICD-10-CM | POA: Diagnosis not present

## 2016-12-28 DIAGNOSIS — E559 Vitamin D deficiency, unspecified: Secondary | ICD-10-CM | POA: Diagnosis not present

## 2016-12-28 DIAGNOSIS — Z79899 Other long term (current) drug therapy: Secondary | ICD-10-CM | POA: Diagnosis not present

## 2016-12-30 DIAGNOSIS — G4733 Obstructive sleep apnea (adult) (pediatric): Secondary | ICD-10-CM | POA: Diagnosis not present

## 2016-12-31 DIAGNOSIS — G4733 Obstructive sleep apnea (adult) (pediatric): Secondary | ICD-10-CM | POA: Diagnosis not present

## 2017-01-03 DIAGNOSIS — Z6829 Body mass index (BMI) 29.0-29.9, adult: Secondary | ICD-10-CM | POA: Diagnosis not present

## 2017-01-03 DIAGNOSIS — R51 Headache: Secondary | ICD-10-CM | POA: Diagnosis not present

## 2017-01-13 ENCOUNTER — Telehealth: Payer: Self-pay | Admitting: Pulmonary Disease

## 2017-01-13 NOTE — Telephone Encounter (Signed)
Pt requests to only see RA.  RA has openings in November in [facility] office which pt agreed to.  Pt has been rescheduled.  Nothing further needed at this time.

## 2017-01-30 DIAGNOSIS — G4733 Obstructive sleep apnea (adult) (pediatric): Secondary | ICD-10-CM | POA: Diagnosis not present

## 2017-01-31 DIAGNOSIS — J301 Allergic rhinitis due to pollen: Secondary | ICD-10-CM | POA: Diagnosis not present

## 2017-01-31 DIAGNOSIS — G4733 Obstructive sleep apnea (adult) (pediatric): Secondary | ICD-10-CM | POA: Diagnosis not present

## 2017-01-31 DIAGNOSIS — Z23 Encounter for immunization: Secondary | ICD-10-CM | POA: Diagnosis not present

## 2017-01-31 DIAGNOSIS — E559 Vitamin D deficiency, unspecified: Secondary | ICD-10-CM | POA: Diagnosis not present

## 2017-01-31 DIAGNOSIS — R5383 Other fatigue: Secondary | ICD-10-CM | POA: Diagnosis not present

## 2017-01-31 DIAGNOSIS — J4520 Mild intermittent asthma, uncomplicated: Secondary | ICD-10-CM | POA: Diagnosis not present

## 2017-02-09 ENCOUNTER — Encounter: Payer: Self-pay | Admitting: Pulmonary Disease

## 2017-02-09 ENCOUNTER — Ambulatory Visit (INDEPENDENT_AMBULATORY_CARE_PROVIDER_SITE_OTHER): Payer: Medicare HMO | Admitting: Pulmonary Disease

## 2017-02-09 DIAGNOSIS — J449 Chronic obstructive pulmonary disease, unspecified: Secondary | ICD-10-CM | POA: Insufficient documentation

## 2017-02-09 DIAGNOSIS — G4733 Obstructive sleep apnea (adult) (pediatric): Secondary | ICD-10-CM

## 2017-02-09 HISTORY — DX: Obstructive sleep apnea (adult) (pediatric): G47.33

## 2017-02-09 HISTORY — DX: Chronic obstructive pulmonary disease, unspecified: J44.9

## 2017-02-09 NOTE — Assessment & Plan Note (Signed)
I will review her PFTs after I obtain records from her pulmonologist. Based on this we will decide if she needs inhalers, subjectively they do not seem to have helped her at all. She may benefit from a pulmonary rehabilitation program

## 2017-02-09 NOTE — Progress Notes (Signed)
Subjective:    Patient ID: Deborah Hutchinson, female    DOB: 1945-07-20, 71 y.o.   MRN: [id number]  HPI  Chief Complaint  Patient presents with  . Pulm Consult    Referred by Dr. Rica Records for asthma with COPD. States she has periods of time where she will have SOB associated with sharp pains on the right side of her head. She will have to lay down and in order for this to past.     71 year old ex heavy smoker presents for evaluation of shortness of breath. She has been diagnosed with asthma and COPD for at least 2 years. She denies childhood history of asthma. She underwent PFTs and sleep studies at the poor neurologist office in [place] and since then has been placed on multiple medications including breo, Spiriva, Singulair and albuterol. Her main complaint is episodes of dyspnea on exertion which occurs sporadically followed by sharp pains on the right side of her head. Lying down for 15 minutes seems to resolve her problems. This has been ongoing for about a year. Daughter accompanies and also notes that her balance has been off although she completed a course of physical therapy at home. She's undergone evaluation including head CT that was negative, she also underwent detailed cardiac evaluation which was negative.  CT angiogram 12/05/16 was negative for pulmonary embolism but showed a 3 mm right lower lobe nodule and a moderate hiatal hernia. She reports occasional reflux  She denies wheezing or frequent chest colds. Loud snoring has been noted by her daughter, she denies daytime somnolence and in fact uses melatonin to help her maintain her sleep. She has been placed on CPAP with nasal pillows and her daughter wonders if her settings are correct. DME is [employer] but she is unhappy and would like to change. She denies gasping or choking episodes and no apneas have been witnessed by her daughter  She smoked about a pack per day until she quit in 2012, about 50-pack-years  Past Medical History:    Diagnosis Date  . Angina    normal stress test  . Bronchitis   . 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 Diabetes mellitus   . Diabetes mellitus due to underlying condition with unspecified complications ([place]) 08/15/1854  . Dyslipidemia 06/10/2015  . Essential hypertension 06/10/2015  . GERD (gastroesophageal reflux disease)   . H/O hiatal hernia   . Heart murmur   . Hyperlipemia   . Hypertension   . Nonspecific (abnormal) findings on radiological and other examination of gastrointestinal tract 05/19/2011  . Pain and swelling of toe of right foot 07/15/2016  . Palpitations 06/02/2016  . Shortness of breath    on  excertion    Past Surgical History:  Procedure Laterality Date  . ABDOMINAL HYSTERECTOMY    . ABDOMINAL HYSTERECTOMY    . BREAST SURGERY     begin mass,left  breast  . DILATION AND CURETTAGE OF UTERUS     one  . EUS  05/19/2011   Procedure: UPPER ENDOSCOPIC ULTRASOUND (EUS) LINEAR;  Surgeon: Owens Loffler, MD;  Location: [facility];  Service: Endoscopy;  Laterality: N/A;  radial linear   . KNEE SURGERY    . LEFT HEART CATH AND CORONARY ANGIOGRAPHY    . right thumb     tendon repaire  . SHOULDER SURGERY     rotator cuff repair  . WRIST SURGERY     rigth    Allergies  Allergen Reactions  . Penicillins Rash    Reaction was 30years ago  Has never taken again    Social History   Social History  . Marital status: Single    Spouse name: N/A  . Number of children: N/A  . Years of education: N/A   Occupational History  . Not on file.   Social History Main Topics  . Smoking status: Former Smoker    Packs/day: 1.00    Quit date: 05/17/2011  . Smokeless tobacco: Never Used  . Alcohol use No  . Drug use: No  . Sexual activity: No   Other Topics Concern  . Not on file   Social History Narrative  . No narrative on file     Family History  Problem Relation Age of Onset  . Diabetes Sister    . Hypertension Daughter      Review of Systems Positive for shortness of breath with activity, acid heartburn, anxiety and depression and headaches as described above   Constitutional: negative for anorexia, fevers and sweats  Eyes: negative for irritation, redness and visual disturbance  Ears, nose, mouth, throat, and face: negative for earaches, epistaxis, nasal congestion and sore throat  Respiratory: negative for cough,  sputum and wheezing  Cardiovascular: negative for chest pain,  lower extremity edema, orthopnea, palpitations and syncope  Gastrointestinal: negative for abdominal pain, constipation, diarrhea, melena, nausea and vomiting  Genitourinary:negative for dysuria, frequency and hematuria  Hematologic/lymphatic: negative for bleeding, easy bruising and lymphadenopathy  Musculoskeletal:negative for arthralgias, muscle weakness and stiff joints  Neurological: negative for coordination problems, gait problems,and weakness  Endocrine: negative for diabetic symptoms including polydipsia, polyuria and weight loss     Objective:   Physical Exam  Gen. Pleasant, well-nourished, in no distress, normal affect ENT - no lesions, no post nasal drip Neck: No JVD, no thyromegaly, no carotid bruits Lungs: no use of accessory muscles, no dullness to percussion, clear without rales or rhonchi  Cardiovascular: Rhythm regular, heart sounds  normal, no murmurs or gallops, no peripheral edema Abdomen: soft and non-tender, no hepatosplenomegaly, BS normal. Musculoskeletal: No deformities, no cyanosis or clubbing Neuro:  alert, non focal       Assessment & Plan:

## 2017-02-09 NOTE — Assessment & Plan Note (Signed)
We'll review sleep studies from [facility] and based on this advise her regarding adjustment of CPAP pressure. We will assist her with changing DME companies to get CPAP supplies

## 2017-02-09 NOTE — Patient Instructions (Signed)
We will obtain sleep studies and PFTs from Dr. Alcide Clever and advise you about breathing medications.  We will help you to change DME for your CPAP machine from [place] may benefit from a rehabilitation program

## 2017-02-10 ENCOUNTER — Institutional Professional Consult (permissible substitution): Payer: Medicare HMO | Admitting: Pulmonary Disease

## 2017-02-17 ENCOUNTER — Telehealth: Payer: Self-pay | Admitting: Pulmonary Disease

## 2017-02-17 NOTE — Telephone Encounter (Signed)
Will send a fax to Dr. Lauralee Evener office via [place].

## 2017-02-17 NOTE — Telephone Encounter (Signed)
I have reviewed all her breathing tests and sleep studies from Dr Alcide Clever  PFTs appear normal she does not have any evidence of COPD and does not need medications. Sleep study also appears normal she does not have OSA, and does not need CPAP machine in my opinion  Please send a copy of this note with my original note to her PCP Dr Rica Records

## 2017-02-21 ENCOUNTER — Telehealth: Payer: Self-pay | Admitting: Pulmonary Disease

## 2017-02-21 NOTE — Telephone Encounter (Signed)
I have reviewed all her breathing tests and sleep studies from Dr Alcide Clever  PFTs appear normal she does not have any evidence of COPD and does not need medications. Sleep study also appears normal she does not have OSA, and does not need CPAP machine in my opinion    Called and spoke with pt and she is aware of RA recs. Nothing further is needed.

## 2017-02-21 NOTE — Telephone Encounter (Signed)
Called and spoke with pt and she stated that she has never heard back from RA about if she can stop some of her inhalers and if she needs to keep using the cpap machine.  RA please advise. Thanks

## 2017-02-21 NOTE — Telephone Encounter (Signed)
Pl look at my phone note from 10/12 & let pt know

## 2017-03-02 DIAGNOSIS — G4733 Obstructive sleep apnea (adult) (pediatric): Secondary | ICD-10-CM | POA: Diagnosis not present

## 2017-03-16 ENCOUNTER — Ambulatory Visit: Payer: Medicare HMO | Admitting: Pulmonary Disease

## 2017-03-19 DIAGNOSIS — K5900 Constipation, unspecified: Secondary | ICD-10-CM | POA: Diagnosis not present

## 2017-03-19 DIAGNOSIS — R1012 Left upper quadrant pain: Secondary | ICD-10-CM | POA: Diagnosis not present

## 2017-03-23 ENCOUNTER — Institutional Professional Consult (permissible substitution): Payer: Medicare HMO | Admitting: Pulmonary Disease

## 2017-03-28 DIAGNOSIS — J309 Allergic rhinitis, unspecified: Secondary | ICD-10-CM | POA: Diagnosis not present

## 2017-03-28 DIAGNOSIS — J45909 Unspecified asthma, uncomplicated: Secondary | ICD-10-CM | POA: Diagnosis not present

## 2017-03-28 DIAGNOSIS — I2510 Atherosclerotic heart disease of native coronary artery without angina pectoris: Secondary | ICD-10-CM | POA: Diagnosis not present

## 2017-03-28 DIAGNOSIS — E663 Overweight: Secondary | ICD-10-CM | POA: Diagnosis not present

## 2017-03-28 DIAGNOSIS — E119 Type 2 diabetes mellitus without complications: Secondary | ICD-10-CM | POA: Diagnosis not present

## 2017-03-28 DIAGNOSIS — Z79899 Other long term (current) drug therapy: Secondary | ICD-10-CM | POA: Diagnosis not present

## 2017-03-28 DIAGNOSIS — R5383 Other fatigue: Secondary | ICD-10-CM | POA: Diagnosis not present

## 2017-03-28 DIAGNOSIS — Z6829 Body mass index (BMI) 29.0-29.9, adult: Secondary | ICD-10-CM | POA: Diagnosis not present

## 2017-04-02 DIAGNOSIS — G4733 Obstructive sleep apnea (adult) (pediatric): Secondary | ICD-10-CM | POA: Diagnosis not present

## 2017-04-03 ENCOUNTER — Encounter: Payer: Self-pay | Admitting: Pulmonary Disease

## 2017-04-03 ENCOUNTER — Ambulatory Visit (INDEPENDENT_AMBULATORY_CARE_PROVIDER_SITE_OTHER): Payer: Medicare HMO | Admitting: Pulmonary Disease

## 2017-04-03 VITALS — BP 137/87 | HR 79 | Ht 65.5 in | Wt 177.0 lb

## 2017-04-03 DIAGNOSIS — R0600 Dyspnea, unspecified: Secondary | ICD-10-CM

## 2017-04-03 NOTE — Patient Instructions (Signed)
For shortness of breath: I am concerned about a condition called pulmonary hypertension based on your description of symptoms We will arrange for an echocardiogram which is an ultrasound of your heart Continue using albuterol on an as-needed basis I will review the records of the lung function test and polysomnogram performed earlier  We will see you back in about 2-3 weeks to go over these results

## 2017-04-03 NOTE — Progress Notes (Signed)
Subjective:   PATIENT ID: Deborah Hutchinson GENDER: female DOB: [birth year], MRN: [id number]  Synopsis: Referred to see Dr. Elsworth Soho in 2018 for dyspnea, possible Asthma/COPD; smoked 2 ppd for 50 years  HPI  Chief Complaint  Patient presents with  . Follow-up    transferring from [facility] to [facility] d/t location- being treated for copd.  c/o sob, fatigue, weakness with exertion.     Deborah Hutchinson is here to see me for dyspnea.  She was seen by my partner who evaluated her the same.    Dyspnea: > has been a problem for a year > associated with a sharp pain in her head  > her daughter says that when she gets busy moving around she will feel short of breath > she has chest pains "all the time" > the dyspnea is not relieved with an inhaler > she wonders if this is due to the medicines she is taking > there are no clear environmental  > this will occur with walking minimal distances in the house > other activities likley seeping the floor is OK > she takes Wellbutrin  Since seeing Dr. Elsworth Soho she has stopped taking the inhalers and her CPAP and she feels no different.  No leg swelling.    She worked Designer, fashion/clothing.   She has seen Dr. Geraldo Pitter for CAD and hypertension.     Past Medical History:  Diagnosis Date  . Angina    normal stress test  . Bronchitis   . 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 Diabetes mellitus   . Diabetes mellitus due to underlying condition with unspecified complications ([place]) 4/0/9811  . Dyslipidemia 06/10/2015  . Essential hypertension 06/10/2015  . GERD (gastroesophageal reflux disease)   . H/O hiatal hernia   . Heart murmur   . Hyperlipemia   . Hypertension   . Nonspecific (abnormal) findings on radiological and other examination of gastrointestinal tract 05/19/2011  . Pain and swelling of toe of right foot 07/15/2016  . Palpitations 06/02/2016  . Shortness of breath    on   excertion     Family History  Problem Relation Age of Onset  . Diabetes Sister   . Hypertension Daughter      Social History   Socioeconomic History  . Marital status: Single    Spouse name: Not on file  . Number of children: Not on file  . Years of education: Not on file  . Highest education level: Not on file  Social Needs  . Financial resource strain: Not on file  . Food insecurity - worry: Not on file  . Food insecurity - inability: Not on file  . Transportation needs - medical: Not on file  . Transportation needs - non-medical: Not on file  Occupational History  . Not on file  Tobacco Use  . Smoking status: Former Smoker    Packs/day: 1.00    Last attempt to quit: 05/17/2011    Years since quitting: 5.8  . Smokeless tobacco: Never Used  Substance and Sexual Activity  . Alcohol use: No  . Drug use: No  . Sexual activity: No  Other Topics Concern  . Not on file  Social History Narrative  . Not on file     Allergies  Allergen Reactions  . Penicillins Rash    Reaction was 30years ago  Has never taken again     Outpatient Medications Prior  to Visit  Medication Sig Dispense Refill  . Albuterol (VENTOLIN IN) Inhale 2 puffs into the lungs every 6 (six) hours as needed (wheezing).     Marland Kitchen amLODipine (NORVASC) 10 MG tablet Take 10 mg by mouth daily.    Marland Kitchen aspirin 81 MG tablet Take 81 mg by mouth daily.     . BD PEN NEEDLE NANO U/F 32G X 4 MM MISC     . buPROPion (WELLBUTRIN XL) 150 MG 24 hr tablet Take 150 mg by mouth daily.    . Cholecalciferol (VITAMIN D PO) Take 5,000 Units by mouth 2 (two) times daily.     Marland Kitchen EPIPEN 2-PAK 0.3 MG/0.3ML SOAJ injection     . escitalopram (LEXAPRO) 10 MG tablet Take 10 mg by mouth daily.    . fluticasone (FLONASE) 50 MCG/ACT nasal spray     . fluticasone (VERAMYST) 27.5 MCG/SPRAY nasal spray Place 2 sprays into the nose daily.    . hydrALAZINE (APRESOLINE) 25 MG tablet Take 37.5 mg by mouth 3 (three) times daily.    . isosorbide  mononitrate (IMDUR) 60 MG 24 hr tablet Take 60 mg by mouth daily.    . Lancets (ACCU-CHEK MULTICLIX) lancets     . losartan-hydrochlorothiazide (HYZAAR) 100-25 MG tablet Take 1 tablet by mouth daily.    . metFORMIN (GLUCOPHAGE) 500 MG tablet Take 500 mg by mouth 2 (two) times daily with a meal.     . methocarbamol (ROBAXIN) 500 MG tablet Take 500 mg by mouth 2 (two) times daily.    . montelukast (SINGULAIR) 10 MG tablet Take 10 mg by mouth daily.    . Multiple Vitamin (MULTIVITAMIN) capsule Take 1 capsule by mouth daily.      . nitroGLYCERIN (NITROSTAT) 0.4 MG SL tablet Place 1 tablet under the tongue every 5 (five) minutes x 3 doses as needed for chest pain.    Marland Kitchen omeprazole (PRILOSEC) 20 MG capsule Take 20 mg by mouth 2 (two) times daily before a meal.     . simvastatin (ZOCOR) 20 MG tablet Take 20 mg by mouth every evening.      . traMADol (ULTRAM) 50 MG tablet Take 50 mg by mouth 2 (two) times daily as needed for pain.    Marland Kitchen VICTOZA 18 MG/3ML SOPN 12 mg daily.     . fluticasone furoate-vilanterol (BREO ELLIPTA) 200-25 MCG/INH AEPB Inhale 1 puff into the lungs daily as needed for wheezing.    . Tiotropium Bromide Monohydrate (SPIRIVA RESPIMAT) 1.25 MCG/ACT AERS Inhale 2 puffs into the lungs daily.     No facility-administered medications prior to visit.     ROS    Objective:  Physical Exam   Vitals:   04/03/17 1325  BP: 137/87  Pulse: 79  SpO2: 98%  Weight: 177 lb (80.3 kg)  Height: 5' 5.5" (1.664 m)    Gen: well appearing HENT: OP clear, TM's clear, neck supple PULM: CTA B, normal percussion CV: RRR, slight heart mumu, JVD noted, trace edema GI: BS+, soft, nontender Derm: no cyanosis or rash Psyche: normal mood and affect   CBC No results found for: WBC, RBC, HGB, HCT, PLT, MCV, MCH, MCHC, RDW, LYMPHSABS, MONOABS, EOSABS, BASOSABS   Chest imaging: 11/2016 CT angiogram chest: stable 45mm nodule (compared to 05/2016), no PE images reviewed, no pulmonary parenchymal  abnormality seen.  PFT:  Labs:  Path:  Echo:  Heart Catheterization:  Record review: Phone note from Dr. Elsworth Soho reviewed: normal PFT and polysomnogram, he didn't think she had  sleep apnea or COPD.     Assessment & Plan:   Dyspnea, unspecified type - Plan: ECHOCARDIOGRAM COMPLETE  Discussion: She complains of progressive shortness of breath over the last year.  I have reviewed the images from her CT chest today with her and I see no evidence of a pulmonary parenchymal abnormality.  Further, my partner says that her lung function testing did not show COPD despite her smoking history.  So at this point I am concerned about something like pulmonary hypertension given the normal looking lung function testing, symptoms of shortness of breath when she bends over, and the lightheadedness.  That being said, the differential diagnosis of dyspnea is broad and this could be as simple as just being overweight and deconditioned.  Plan: For shortness of breath: I am concerned about a condition called pulmonary hypertension based on your description of symptoms We will arrange for an echocardiogram which is an ultrasound of your heart Continue using albuterol on an as-needed basis I will review the records of the lung function test and polysomnogram performed earlier  We will see you back in about 2-3 weeks to go over these results    Current Outpatient Medications:  .  Albuterol (VENTOLIN IN), Inhale 2 puffs into the lungs every 6 (six) hours as needed (wheezing). , Disp: , Rfl:  .  amLODipine (NORVASC) 10 MG tablet, Take 10 mg by mouth daily., Disp: , Rfl:  .  aspirin 81 MG tablet, Take 81 mg by mouth daily. , Disp: , Rfl:  .  BD PEN NEEDLE NANO U/F 32G X 4 MM MISC, , Disp: , Rfl:  .  buPROPion (WELLBUTRIN XL) 150 MG 24 hr tablet, Take 150 mg by mouth daily., Disp: , Rfl:  .  Cholecalciferol (VITAMIN D PO), Take 5,000 Units by mouth 2 (two) times daily. , Disp: , Rfl:  .  EPIPEN 2-PAK 0.3  MG/0.3ML SOAJ injection, , Disp: , Rfl:  .  escitalopram (LEXAPRO) 10 MG tablet, Take 10 mg by mouth daily., Disp: , Rfl:  .  fluticasone (FLONASE) 50 MCG/ACT nasal spray, , Disp: , Rfl:  .  fluticasone (VERAMYST) 27.5 MCG/SPRAY nasal spray, Place 2 sprays into the nose daily., Disp: , Rfl:  .  hydrALAZINE (APRESOLINE) 25 MG tablet, Take 37.5 mg by mouth 3 (three) times daily., Disp: , Rfl:  .  isosorbide mononitrate (IMDUR) 60 MG 24 hr tablet, Take 60 mg by mouth daily., Disp: , Rfl:  .  Lancets (ACCU-CHEK MULTICLIX) lancets, , Disp: , Rfl:  .  losartan-hydrochlorothiazide (HYZAAR) 100-25 MG tablet, Take 1 tablet by mouth daily., Disp: , Rfl:  .  metFORMIN (GLUCOPHAGE) 500 MG tablet, Take 500 mg by mouth 2 (two) times daily with a meal. , Disp: , Rfl:  .  methocarbamol (ROBAXIN) 500 MG tablet, Take 500 mg by mouth 2 (two) times daily., Disp: , Rfl:  .  montelukast (SINGULAIR) 10 MG tablet, Take 10 mg by mouth daily., Disp: , Rfl:  .  Multiple Vitamin (MULTIVITAMIN) capsule, Take 1 capsule by mouth daily.  , Disp: , Rfl:  .  nitroGLYCERIN (NITROSTAT) 0.4 MG SL tablet, Place 1 tablet under the tongue every 5 (five) minutes x 3 doses as needed for chest pain., Disp: , Rfl:  .  omeprazole (PRILOSEC) 20 MG capsule, Take 20 mg by mouth 2 (two) times daily before a meal. , Disp: , Rfl:  .  simvastatin (ZOCOR) 20 MG tablet, Take 20 mg by mouth every evening.  ,  Disp: , Rfl:  .  traMADol (ULTRAM) 50 MG tablet, Take 50 mg by mouth 2 (two) times daily as needed for pain., Disp: , Rfl:  .  VICTOZA 18 MG/3ML SOPN, 12 mg daily. , Disp: , Rfl:

## 2017-04-05 ENCOUNTER — Ambulatory Visit (HOSPITAL_BASED_OUTPATIENT_CLINIC_OR_DEPARTMENT_OTHER)
Admission: RE | Admit: 2017-04-05 | Discharge: 2017-04-05 | Disposition: A | Discharge status: Home / Self Care | Payer: Medicare HMO | Source: Ambulatory Visit | Attending: Pulmonary Disease | Admitting: Pulmonary Disease

## 2017-04-05 DIAGNOSIS — I501 Left ventricular failure: Secondary | ICD-10-CM | POA: Diagnosis not present

## 2017-04-05 DIAGNOSIS — I060 Rheumatic aortic stenosis: Secondary | ICD-10-CM | POA: Insufficient documentation

## 2017-04-05 DIAGNOSIS — R0600 Dyspnea, unspecified: Secondary | ICD-10-CM

## 2017-04-05 NOTE — Progress Notes (Signed)
Echocardiogram 2D Echocardiogram has been performed.  Joelene Millin 04/05/2017, 12:27 PM

## 2017-04-25 ENCOUNTER — Encounter: Payer: Self-pay | Admitting: Pulmonary Disease

## 2017-04-25 ENCOUNTER — Ambulatory Visit (INDEPENDENT_AMBULATORY_CARE_PROVIDER_SITE_OTHER): Payer: Medicare HMO | Admitting: Pulmonary Disease

## 2017-04-25 VITALS — BP 124/70 | HR 83 | Ht 65.5 in | Wt 172.0 lb

## 2017-04-25 DIAGNOSIS — R0602 Shortness of breath: Secondary | ICD-10-CM | POA: Diagnosis not present

## 2017-04-25 NOTE — Progress Notes (Signed)
Subjective:   PATIENT ID: Deborah Hutchinson GENDER: female DOB: [birth year], MRN: [id number]  Synopsis: Referred to see Dr. Elsworth Soho in 2018 for dyspnea, possible Asthma/COPD though she has normal pulmonary function testing.  Smoked 2 ppd for 50 years  HPI  Chief Complaint  Patient presents with  . Follow-up    3wk rov- pt reports of increased sob & chest discomfort.    [name] says that she is still struggling with shortness of breath.  She says that she continues to have sudden attacks of dyspnea.  She says that these attacks will happen spontaneously.  No prior anxiety will be associated with them.  She has not had any recent chest tightness or cough.  She says that the albuterol medicine actually made the attacks happen more frequently.  Past Medical History:  Diagnosis Date  . Angina    normal stress test  . Bronchitis   . 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 Diabetes mellitus   . Diabetes mellitus due to underlying condition with unspecified complications ([place]) 06/13/2351  . Dyslipidemia 06/10/2015  . Essential hypertension 06/10/2015  . GERD (gastroesophageal reflux disease)   . H/O hiatal hernia   . Heart murmur   . Hyperlipemia   . Hypertension   . Nonspecific (abnormal) findings on radiological and other examination of gastrointestinal tract 05/19/2011  . Pain and swelling of toe of right foot 07/15/2016  . Palpitations 06/02/2016  . Shortness of breath    on  excertion      ROS    Objective:  Physical Exam   Vitals:   04/25/17 1140  BP: 124/70  Pulse: 83  SpO2: 97%  Weight: 172 lb (78 kg)  Height: 5' 5.5" (1.664 m)    .Gen: well appearing HENT: OP clear, TM's clear, neck supple PULM: CTA B, normal percussion CV: RRR, slight systolic murmur, trace edema GI: BS+, soft, nontender Derm: no cyanosis or rash Psyche: normal mood and affect   CBC No results found for: WBC, RBC, HGB, HCT, PLT,  MCV, MCH, MCHC, RDW, LYMPHSABS, MONOABS, EOSABS, BASOSABS   Chest imaging: 11/2016 CT angiogram chest: stable 88mm nodule (compared to 05/2016), no PE images reviewed, no pulmonary parenchymal abnormality seen.  PFT:  Labs:  Path:  Echo: November 2018 transthoracic echocardiogram LVEF 60-65%, RV normal, PA pressure estimate normal  Heart Catheterization:  Record review: Records from in April 2018 visit with [facility] cardiology reviewed where she was felt to have coronary artery disease that did not need intervention.     Assessment & Plan:   SOB (shortness of breath) - Plan: Cardiopulmonary exercise test  Discussion: [name] continues to have episodic shortness of breath.  In the past year she has had a normal lung function test, sleep study, heart catheterization, and now echocardiogram.  It is hard for me to say that she has an underlay heart or lung though I have not been able to see the results of the lung function test myself.  I explained to her that when we have patients he still have shortness of breath at this point sometimes the best approach is to perform a cardiopulmonary exercise test to try to get to the bottom of things.  I think this is the next most appropriate step.  She is agreeable to this plan.  Plan: Shortness of breath: We are going to arrange for a test called a cardiopulmonary exercise stress test to try to get  to the bottom of what is going on If that test is normal then the most likely diagnosis is anxiety  We will see you back in 4-6 weeks to go over the results of the test    Current Outpatient Medications:  .  Albuterol (VENTOLIN IN), Inhale 2 puffs into the lungs every 6 (six) hours as needed (wheezing). , Disp: , Rfl:  .  amLODipine (NORVASC) 10 MG tablet, Take 10 mg by mouth daily., Disp: , Rfl:  .  aspirin 81 MG tablet, Take 81 mg by mouth daily. , Disp: , Rfl:  .  BD PEN NEEDLE NANO U/F 32G X 4 MM MISC, , Disp: , Rfl:  .  buPROPion (WELLBUTRIN XL)  150 MG 24 hr tablet, Take 150 mg by mouth daily., Disp: , Rfl:  .  Cholecalciferol (VITAMIN D PO), Take 5,000 Units by mouth 2 (two) times daily. , Disp: , Rfl:  .  EPIPEN 2-PAK 0.3 MG/0.3ML SOAJ injection, , Disp: , Rfl:  .  escitalopram (LEXAPRO) 10 MG tablet, Take 10 mg by mouth daily., Disp: , Rfl:  .  fluticasone (FLONASE) 50 MCG/ACT nasal spray, , Disp: , Rfl:  .  fluticasone (VERAMYST) 27.5 MCG/SPRAY nasal spray, Place 2 sprays into the nose daily., Disp: , Rfl:  .  hydrALAZINE (APRESOLINE) 25 MG tablet, Take 37.5 mg by mouth 3 (three) times daily., Disp: , Rfl:  .  isosorbide mononitrate (IMDUR) 60 MG 24 hr tablet, Take 60 mg by mouth daily., Disp: , Rfl:  .  Lancets (ACCU-CHEK MULTICLIX) lancets, , Disp: , Rfl:  .  losartan-hydrochlorothiazide (HYZAAR) 100-25 MG tablet, Take 1 tablet by mouth daily., Disp: , Rfl:  .  metFORMIN (GLUCOPHAGE) 500 MG tablet, Take 500 mg by mouth 2 (two) times daily with a meal. , Disp: , Rfl:  .  methocarbamol (ROBAXIN) 500 MG tablet, Take 500 mg by mouth 2 (two) times daily., Disp: , Rfl:  .  montelukast (SINGULAIR) 10 MG tablet, Take 10 mg by mouth daily., Disp: , Rfl:  .  Multiple Vitamin (MULTIVITAMIN) capsule, Take 1 capsule by mouth daily.  , Disp: , Rfl:  .  nitroGLYCERIN (NITROSTAT) 0.4 MG SL tablet, Place 1 tablet under the tongue every 5 (five) minutes x 3 doses as needed for chest pain., Disp: , Rfl:  .  omeprazole (PRILOSEC) 20 MG capsule, Take 20 mg by mouth 2 (two) times daily before a meal. , Disp: , Rfl:  .  simvastatin (ZOCOR) 20 MG tablet, Take 20 mg by mouth every evening.  , Disp: , Rfl:  .  traMADol (ULTRAM) 50 MG tablet, Take 50 mg by mouth 2 (two) times daily as needed for pain., Disp: , Rfl:  .  VICTOZA 18 MG/3ML SOPN, 12 mg daily. , Disp: , Rfl:

## 2017-04-25 NOTE — Patient Instructions (Signed)
Shortness of breath: We are going to arrange for a test called a cardiopulmonary exercise stress test to try to get to the bottom of what is going on If that test is normal then the most likely diagnosis is anxiety  We will see you back in 4-6 weeks to go over the results of the test

## 2017-05-02 DIAGNOSIS — G4733 Obstructive sleep apnea (adult) (pediatric): Secondary | ICD-10-CM | POA: Diagnosis not present

## 2017-05-08 ENCOUNTER — Ambulatory Visit (HOSPITAL_COMMUNITY): Payer: Medicare HMO | Attending: Pulmonary Disease

## 2017-05-08 DIAGNOSIS — R0602 Shortness of breath: Secondary | ICD-10-CM | POA: Insufficient documentation

## 2017-05-10 DIAGNOSIS — I2510 Atherosclerotic heart disease of native coronary artery without angina pectoris: Secondary | ICD-10-CM | POA: Diagnosis not present

## 2017-05-10 DIAGNOSIS — M545 Low back pain: Secondary | ICD-10-CM | POA: Diagnosis not present

## 2017-05-10 DIAGNOSIS — N343 Urethral syndrome, unspecified: Secondary | ICD-10-CM | POA: Diagnosis not present

## 2017-05-10 DIAGNOSIS — J309 Allergic rhinitis, unspecified: Secondary | ICD-10-CM | POA: Diagnosis not present

## 2017-05-10 DIAGNOSIS — R0602 Shortness of breath: Secondary | ICD-10-CM | POA: Diagnosis not present

## 2017-05-10 DIAGNOSIS — J45909 Unspecified asthma, uncomplicated: Secondary | ICD-10-CM | POA: Diagnosis not present

## 2017-05-10 DIAGNOSIS — Z6828 Body mass index (BMI) 28.0-28.9, adult: Secondary | ICD-10-CM | POA: Diagnosis not present

## 2017-05-18 DIAGNOSIS — R0602 Shortness of breath: Secondary | ICD-10-CM

## 2017-05-31 DIAGNOSIS — R270 Ataxia, unspecified: Secondary | ICD-10-CM | POA: Diagnosis not present

## 2017-05-31 DIAGNOSIS — E669 Obesity, unspecified: Secondary | ICD-10-CM | POA: Diagnosis not present

## 2017-05-31 DIAGNOSIS — Z6828 Body mass index (BMI) 28.0-28.9, adult: Secondary | ICD-10-CM | POA: Diagnosis not present

## 2017-05-31 DIAGNOSIS — L989 Disorder of the skin and subcutaneous tissue, unspecified: Secondary | ICD-10-CM | POA: Diagnosis not present

## 2017-05-31 DIAGNOSIS — R296 Repeated falls: Secondary | ICD-10-CM | POA: Diagnosis not present

## 2017-06-01 DIAGNOSIS — H5203 Hypermetropia, bilateral: Secondary | ICD-10-CM | POA: Diagnosis not present

## 2017-06-02 DIAGNOSIS — G4733 Obstructive sleep apnea (adult) (pediatric): Secondary | ICD-10-CM | POA: Diagnosis not present

## 2017-06-05 DIAGNOSIS — E0822 Diabetes mellitus due to underlying condition with diabetic chronic kidney disease: Secondary | ICD-10-CM | POA: Diagnosis not present

## 2017-06-06 ENCOUNTER — Ambulatory Visit: Payer: Medicare HMO | Admitting: Pulmonary Disease

## 2017-06-06 DIAGNOSIS — E0822 Diabetes mellitus due to underlying condition with diabetic chronic kidney disease: Secondary | ICD-10-CM | POA: Diagnosis not present

## 2017-06-07 DIAGNOSIS — E0822 Diabetes mellitus due to underlying condition with diabetic chronic kidney disease: Secondary | ICD-10-CM | POA: Diagnosis not present

## 2017-06-08 DIAGNOSIS — E0822 Diabetes mellitus due to underlying condition with diabetic chronic kidney disease: Secondary | ICD-10-CM | POA: Diagnosis not present

## 2017-06-09 DIAGNOSIS — E0822 Diabetes mellitus due to underlying condition with diabetic chronic kidney disease: Secondary | ICD-10-CM | POA: Diagnosis not present

## 2017-06-10 DIAGNOSIS — E0822 Diabetes mellitus due to underlying condition with diabetic chronic kidney disease: Secondary | ICD-10-CM | POA: Diagnosis not present

## 2017-06-11 DIAGNOSIS — E0822 Diabetes mellitus due to underlying condition with diabetic chronic kidney disease: Secondary | ICD-10-CM | POA: Diagnosis not present

## 2017-06-12 DIAGNOSIS — E0822 Diabetes mellitus due to underlying condition with diabetic chronic kidney disease: Secondary | ICD-10-CM | POA: Diagnosis not present

## 2017-06-13 DIAGNOSIS — E0822 Diabetes mellitus due to underlying condition with diabetic chronic kidney disease: Secondary | ICD-10-CM | POA: Diagnosis not present

## 2017-06-14 DIAGNOSIS — E0822 Diabetes mellitus due to underlying condition with diabetic chronic kidney disease: Secondary | ICD-10-CM | POA: Diagnosis not present

## 2017-06-15 DIAGNOSIS — E0822 Diabetes mellitus due to underlying condition with diabetic chronic kidney disease: Secondary | ICD-10-CM | POA: Diagnosis not present

## 2017-06-16 DIAGNOSIS — E0822 Diabetes mellitus due to underlying condition with diabetic chronic kidney disease: Secondary | ICD-10-CM | POA: Diagnosis not present

## 2017-06-17 DIAGNOSIS — E0822 Diabetes mellitus due to underlying condition with diabetic chronic kidney disease: Secondary | ICD-10-CM | POA: Diagnosis not present

## 2017-06-18 DIAGNOSIS — E0822 Diabetes mellitus due to underlying condition with diabetic chronic kidney disease: Secondary | ICD-10-CM | POA: Diagnosis not present

## 2017-06-19 DIAGNOSIS — E0822 Diabetes mellitus due to underlying condition with diabetic chronic kidney disease: Secondary | ICD-10-CM | POA: Diagnosis not present

## 2017-06-20 DIAGNOSIS — K219 Gastro-esophageal reflux disease without esophagitis: Secondary | ICD-10-CM | POA: Diagnosis not present

## 2017-06-20 DIAGNOSIS — E0822 Diabetes mellitus due to underlying condition with diabetic chronic kidney disease: Secondary | ICD-10-CM | POA: Diagnosis not present

## 2017-06-20 DIAGNOSIS — R634 Abnormal weight loss: Secondary | ICD-10-CM | POA: Diagnosis not present

## 2017-06-20 DIAGNOSIS — R1032 Left lower quadrant pain: Secondary | ICD-10-CM | POA: Diagnosis not present

## 2017-06-21 DIAGNOSIS — E0822 Diabetes mellitus due to underlying condition with diabetic chronic kidney disease: Secondary | ICD-10-CM | POA: Diagnosis not present

## 2017-06-22 DIAGNOSIS — J309 Allergic rhinitis, unspecified: Secondary | ICD-10-CM | POA: Diagnosis not present

## 2017-06-22 DIAGNOSIS — N644 Mastodynia: Secondary | ICD-10-CM | POA: Diagnosis not present

## 2017-06-22 DIAGNOSIS — E0822 Diabetes mellitus due to underlying condition with diabetic chronic kidney disease: Secondary | ICD-10-CM | POA: Diagnosis not present

## 2017-06-22 DIAGNOSIS — Z6827 Body mass index (BMI) 27.0-27.9, adult: Secondary | ICD-10-CM | POA: Diagnosis not present

## 2017-06-23 DIAGNOSIS — E0822 Diabetes mellitus due to underlying condition with diabetic chronic kidney disease: Secondary | ICD-10-CM | POA: Diagnosis not present

## 2017-06-24 DIAGNOSIS — E0822 Diabetes mellitus due to underlying condition with diabetic chronic kidney disease: Secondary | ICD-10-CM | POA: Diagnosis not present

## 2017-06-25 DIAGNOSIS — E0822 Diabetes mellitus due to underlying condition with diabetic chronic kidney disease: Secondary | ICD-10-CM | POA: Diagnosis not present

## 2017-06-26 DIAGNOSIS — E0822 Diabetes mellitus due to underlying condition with diabetic chronic kidney disease: Secondary | ICD-10-CM | POA: Diagnosis not present

## 2017-06-27 DIAGNOSIS — E0822 Diabetes mellitus due to underlying condition with diabetic chronic kidney disease: Secondary | ICD-10-CM | POA: Diagnosis not present

## 2017-06-28 DIAGNOSIS — R109 Unspecified abdominal pain: Secondary | ICD-10-CM | POA: Diagnosis not present

## 2017-06-28 DIAGNOSIS — K449 Diaphragmatic hernia without obstruction or gangrene: Secondary | ICD-10-CM | POA: Diagnosis not present

## 2017-06-28 DIAGNOSIS — K219 Gastro-esophageal reflux disease without esophagitis: Secondary | ICD-10-CM | POA: Diagnosis not present

## 2017-06-28 DIAGNOSIS — E279 Disorder of adrenal gland, unspecified: Secondary | ICD-10-CM | POA: Diagnosis not present

## 2017-06-28 DIAGNOSIS — R634 Abnormal weight loss: Secondary | ICD-10-CM | POA: Diagnosis not present

## 2017-06-28 DIAGNOSIS — R1032 Left lower quadrant pain: Secondary | ICD-10-CM | POA: Diagnosis not present

## 2017-06-28 DIAGNOSIS — E0822 Diabetes mellitus due to underlying condition with diabetic chronic kidney disease: Secondary | ICD-10-CM | POA: Diagnosis not present

## 2017-06-29 DIAGNOSIS — E0822 Diabetes mellitus due to underlying condition with diabetic chronic kidney disease: Secondary | ICD-10-CM | POA: Diagnosis not present

## 2017-06-30 DIAGNOSIS — E0822 Diabetes mellitus due to underlying condition with diabetic chronic kidney disease: Secondary | ICD-10-CM | POA: Diagnosis not present

## 2017-07-01 DIAGNOSIS — E0822 Diabetes mellitus due to underlying condition with diabetic chronic kidney disease: Secondary | ICD-10-CM | POA: Diagnosis not present

## 2017-07-02 DIAGNOSIS — E0822 Diabetes mellitus due to underlying condition with diabetic chronic kidney disease: Secondary | ICD-10-CM | POA: Diagnosis not present

## 2017-07-03 DIAGNOSIS — H52209 Unspecified astigmatism, unspecified eye: Secondary | ICD-10-CM | POA: Diagnosis not present

## 2017-07-03 DIAGNOSIS — H524 Presbyopia: Secondary | ICD-10-CM | POA: Diagnosis not present

## 2017-07-03 DIAGNOSIS — G4733 Obstructive sleep apnea (adult) (pediatric): Secondary | ICD-10-CM | POA: Diagnosis not present

## 2017-07-03 DIAGNOSIS — H5203 Hypermetropia, bilateral: Secondary | ICD-10-CM | POA: Diagnosis not present

## 2017-07-03 DIAGNOSIS — E0822 Diabetes mellitus due to underlying condition with diabetic chronic kidney disease: Secondary | ICD-10-CM | POA: Diagnosis not present

## 2017-07-04 DIAGNOSIS — E0822 Diabetes mellitus due to underlying condition with diabetic chronic kidney disease: Secondary | ICD-10-CM | POA: Diagnosis not present

## 2017-07-05 DIAGNOSIS — E663 Overweight: Secondary | ICD-10-CM | POA: Diagnosis not present

## 2017-07-05 DIAGNOSIS — E0822 Diabetes mellitus due to underlying condition with diabetic chronic kidney disease: Secondary | ICD-10-CM | POA: Diagnosis not present

## 2017-07-05 DIAGNOSIS — M940 Chondrocostal junction syndrome [Tietze]: Secondary | ICD-10-CM | POA: Diagnosis not present

## 2017-07-05 DIAGNOSIS — R1032 Left lower quadrant pain: Secondary | ICD-10-CM | POA: Diagnosis not present

## 2017-07-05 DIAGNOSIS — F419 Anxiety disorder, unspecified: Secondary | ICD-10-CM | POA: Diagnosis not present

## 2017-07-05 DIAGNOSIS — J309 Allergic rhinitis, unspecified: Secondary | ICD-10-CM | POA: Diagnosis not present

## 2017-07-05 DIAGNOSIS — R197 Diarrhea, unspecified: Secondary | ICD-10-CM | POA: Diagnosis not present

## 2017-07-05 DIAGNOSIS — Z6827 Body mass index (BMI) 27.0-27.9, adult: Secondary | ICD-10-CM | POA: Diagnosis not present

## 2017-07-06 ENCOUNTER — Encounter: Payer: Self-pay | Admitting: Pulmonary Disease

## 2017-07-06 ENCOUNTER — Ambulatory Visit (INDEPENDENT_AMBULATORY_CARE_PROVIDER_SITE_OTHER): Payer: Medicare HMO | Admitting: Pulmonary Disease

## 2017-07-06 VITALS — BP 124/66 | HR 90 | Ht 65.5 in | Wt 168.0 lb

## 2017-07-06 DIAGNOSIS — R0602 Shortness of breath: Secondary | ICD-10-CM

## 2017-07-06 DIAGNOSIS — E0822 Diabetes mellitus due to underlying condition with diabetic chronic kidney disease: Secondary | ICD-10-CM | POA: Diagnosis not present

## 2017-07-06 DIAGNOSIS — J45990 Exercise induced bronchospasm: Secondary | ICD-10-CM

## 2017-07-06 MED ORDER — ALBUTEROL SULFATE HFA 108 (90 BASE) MCG/ACT IN AERS: 2.0000 | INHALATION_SPRAY | Freq: Four times a day (QID) | RESPIRATORY_TRACT | 3 refills | Status: AC | PRN

## 2017-07-06 NOTE — Progress Notes (Signed)
Subjective:   PATIENT ID: Deborah Hutchinson GENDER: female DOB: 11/22/1945, MRN: [id number]  Synopsis: Referred to see Dr. Elsworth Soho in 2018 for dyspnea, possible Asthma/COPD though she has normal pulmonary function testing.  Smoked 2 ppd for 50 years  HPI  Chief Complaint  Patient presents with  . Follow-up    review CPST.  pt c/o sob, allergy symptoms, balance issues.   requesting allergy testing.    She is taking Spiriva daily She is taking albuterol 2 puffs daily She has a lot of allergy symptoms.  She takes Claritin-D and is taking it for the last year.  She also takes fluticasone nasal spray every morning.  She still says she is Tat having some shortness of breath when she exerts herself.  It is may be not as bad as it was a year ago.  No cough no mucus production.  She is taking Spiriva twice a day which interestingly is not on her medication list.  Past Medical History:  Diagnosis Date  . Angina    normal stress test  . Bronchitis   . 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 Diabetes mellitus   . Diabetes mellitus due to underlying condition with unspecified complications ([place]) 2/0/9470  . Dyslipidemia 06/10/2015  . Essential hypertension 06/10/2015  . GERD (gastroesophageal reflux disease)   . H/O hiatal hernia   . Heart murmur   . Hyperlipemia   . Hypertension   . Nonspecific (abnormal) findings on radiological and other examination of gastrointestinal tract 05/19/2011  . Pain and swelling of toe of right foot 07/15/2016  . Palpitations 06/02/2016  . Shortness of breath    on  excertion      Review of Systems  Constitutional: Positive for malaise/fatigue. Negative for chills.  HENT: Positive for congestion. Negative for ear discharge and sore throat.   Respiratory: Positive for shortness of breath. Negative for cough and wheezing.   Cardiovascular: Negative for chest pain, claudication and leg swelling.    Neurological: Negative for weakness.      Objective:  Physical Exam   Vitals:   07/06/17 1518  BP: 124/66  Pulse: 90  SpO2: 97%  Weight: 168 lb (76.2 kg)  Height: 5' 5.5" (1.664 m)  RA   Gen: well appearing HENT: OP clear, TM's clear, neck supple PULM: CTA B, normal percussion CV: RRR, no mgr, trace edema GI: BS+, soft, nontender Derm: no cyanosis or rash Psyche: normal mood and affect    CBC No results found for: WBC, RBC, HGB, HCT, PLT, MCV, MCH, MCHC, RDW, LYMPHSABS, MONOABS, EOSABS, BASOSABS   Chest imaging: 11/2016 CT angiogram chest: stable 34mm nodule (compared to 05/2016), no PE images reviewed, no pulmonary parenchymal abnormality seen.  PFT: December 2018 cardiopulmonary exercise test spirometry ratio 70%, FEV1 1.81 L 76% predicted, FEV1 post 1.64 L, 9% change: VO2 max 12.1 mL/kg/min, 83% predicted, heart rate maximum exercise 101, ventilatory max at exercise approximately 50% of maximum with exercise ventilatory capacity  O2 saturation 99%  Labs:  Path:  Echo: November 2018 transthoracic echocardiogram LVEF 60-65%, RV normal, PA pressure estimate normal  Heart Catheterization:  Record review: Records from in April 2018 visit with [facility] cardiology reviewed where she was felt to have coronary artery disease that did not need intervention.     Assessment & Plan:   SOB (shortness of breath)  Exercise-induced asthma  Discussion: In the last year but he has undergone a  huge battery of tests to assess her dyspnea.  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  She is profoundly deconditioned and needs to exercise more.  I do wonder a bit about chronic trophic incompetence as when she was exercising her max heart rate only had 101.    Plan: Exercise-induced asthma: Use albuterol 2 puffs  prior to exercise and as needed for chest tightness wheezing or shortness of breath Stop Spiriva  Shortness of breath: I think you need to exercise more frequently I recommend you go to your rec sooner and slowly work up to participation in [employer]  We will see you back in 3 months, hopefully after exercising regularly he will be breathing better    Current Outpatient Medications:  .  Albuterol (VENTOLIN IN), Inhale 2 puffs into the lungs every 6 (six) hours as needed (wheezing). , Disp: , Rfl:  .  amLODipine (NORVASC) 10 MG tablet, Take 10 mg by mouth daily., Disp: , Rfl:  .  aspirin 81 MG tablet, Take 81 mg by mouth daily. , Disp: , Rfl:  .  BD PEN NEEDLE NANO U/F 32G X 4 MM MISC, , Disp: , Rfl:  .  buPROPion (WELLBUTRIN XL) 150 MG 24 hr tablet, Take 150 mg by mouth daily., Disp: , Rfl:  .  Cholecalciferol (VITAMIN D PO), Take 5,000 Units by mouth 2 (two) times daily. , Disp: , Rfl:  .  EPIPEN 2-PAK 0.3 MG/0.3ML SOAJ injection, , Disp: , Rfl:  .  escitalopram (LEXAPRO) 10 MG tablet, Take 10 mg by mouth daily., Disp: , Rfl:  .  fluticasone (FLONASE) 50 MCG/ACT nasal spray, , Disp: , Rfl:  .  fluticasone (VERAMYST) 27.5 MCG/SPRAY nasal spray, Place 2 sprays into the nose daily., Disp: , Rfl:  .  hydrALAZINE (APRESOLINE) 25 MG tablet, Take 37.5 mg by mouth 3 (three) times daily., Disp: , Rfl:  .  isosorbide mononitrate (IMDUR) 60 MG 24 hr tablet, Take 60 mg by mouth daily., Disp: , Rfl:  .  Lancets (ACCU-CHEK MULTICLIX) lancets, , Disp: , Rfl:  .  losartan-hydrochlorothiazide (HYZAAR) 100-25 MG tablet, Take 1 tablet by mouth daily., Disp: , Rfl:  .  metFORMIN (GLUCOPHAGE) 500 MG tablet, Take 500 mg by mouth 2 (two) times daily with a meal. , Disp: , Rfl:  .  methocarbamol (ROBAXIN) 500 MG tablet, Take 500 mg by mouth 2 (two) times daily., Disp: , Rfl:  .  montelukast (SINGULAIR) 10 MG tablet, Take 10 mg by mouth daily., Disp: , Rfl:  .  Multiple Vitamin (MULTIVITAMIN) capsule,  Take 1 capsule by mouth daily.  , Disp: , Rfl:  .  nitroGLYCERIN (NITROSTAT) 0.4 MG SL tablet, Place 1 tablet under the tongue every 5 (five) minutes x 3 doses as needed for chest pain., Disp: , Rfl:  .  omeprazole (PRILOSEC) 20 MG capsule, Take 20 mg by mouth 2 (two) times daily before a meal. , Disp: , Rfl:  .  simvastatin (ZOCOR) 20 MG tablet, Take 20 mg by mouth every evening.  , Disp: , Rfl:  .  traMADol (ULTRAM) 50 MG tablet, Take 50 mg by mouth 2 (two) times daily as needed for pain., Disp: , Rfl:  .  VICTOZA 18 MG/3ML SOPN, 12 mg daily. , Disp: , Rfl:  .  albuterol (PROVENTIL HFA;VENTOLIN HFA) 108 (90 Base) MCG/ACT inhaler, Inhale  2 puffs into the lungs every 6 (six) hours as needed for wheezing or shortness of breath., Disp: 3 Inhaler, Rfl: 3

## 2017-07-06 NOTE — Patient Instructions (Signed)
Exercise-induced asthma: Use albuterol 2 puffs prior to exercise and as needed for chest tightness wheezing or shortness of breath Stop Spiriva  Shortness of breath: I think you need to exercise more frequently I recommend you go to your rec sooner and slowly work up to participation in [employer]  We will see you back in 3 months, hopefully after exercising regularly he will be breathing better

## 2017-07-07 DIAGNOSIS — E0822 Diabetes mellitus due to underlying condition with diabetic chronic kidney disease: Secondary | ICD-10-CM | POA: Diagnosis not present

## 2017-07-08 DIAGNOSIS — E0822 Diabetes mellitus due to underlying condition with diabetic chronic kidney disease: Secondary | ICD-10-CM | POA: Diagnosis not present

## 2017-07-09 DIAGNOSIS — E0822 Diabetes mellitus due to underlying condition with diabetic chronic kidney disease: Secondary | ICD-10-CM | POA: Diagnosis not present

## 2017-07-10 DIAGNOSIS — E0822 Diabetes mellitus due to underlying condition with diabetic chronic kidney disease: Secondary | ICD-10-CM | POA: Diagnosis not present

## 2017-07-11 ENCOUNTER — Encounter: Payer: Self-pay | Admitting: Gastroenterology

## 2017-07-11 DIAGNOSIS — E0822 Diabetes mellitus due to underlying condition with diabetic chronic kidney disease: Secondary | ICD-10-CM | POA: Diagnosis not present

## 2017-07-12 ENCOUNTER — Telehealth: Payer: Self-pay | Admitting: Pulmonary Disease

## 2017-07-12 DIAGNOSIS — E0822 Diabetes mellitus due to underlying condition with diabetic chronic kidney disease: Secondary | ICD-10-CM | POA: Diagnosis not present

## 2017-07-12 NOTE — Telephone Encounter (Signed)
Spoke with pt, states she received an email from [employer] that says a PA is required for pt's albuterol inhaler.   [facility], switched brand name under which it is filled, so nothing further is needed. Marland Kitchen

## 2017-07-13 DIAGNOSIS — E0822 Diabetes mellitus due to underlying condition with diabetic chronic kidney disease: Secondary | ICD-10-CM | POA: Diagnosis not present

## 2017-07-14 DIAGNOSIS — E0822 Diabetes mellitus due to underlying condition with diabetic chronic kidney disease: Secondary | ICD-10-CM | POA: Diagnosis not present

## 2017-07-15 DIAGNOSIS — E0822 Diabetes mellitus due to underlying condition with diabetic chronic kidney disease: Secondary | ICD-10-CM | POA: Diagnosis not present

## 2017-07-16 DIAGNOSIS — E0822 Diabetes mellitus due to underlying condition with diabetic chronic kidney disease: Secondary | ICD-10-CM | POA: Diagnosis not present

## 2017-07-17 DIAGNOSIS — E0822 Diabetes mellitus due to underlying condition with diabetic chronic kidney disease: Secondary | ICD-10-CM | POA: Diagnosis not present

## 2017-07-18 DIAGNOSIS — E0822 Diabetes mellitus due to underlying condition with diabetic chronic kidney disease: Secondary | ICD-10-CM | POA: Diagnosis not present

## 2017-07-19 DIAGNOSIS — E0822 Diabetes mellitus due to underlying condition with diabetic chronic kidney disease: Secondary | ICD-10-CM | POA: Diagnosis not present

## 2017-07-20 DIAGNOSIS — E0822 Diabetes mellitus due to underlying condition with diabetic chronic kidney disease: Secondary | ICD-10-CM | POA: Diagnosis not present

## 2017-07-21 DIAGNOSIS — E0822 Diabetes mellitus due to underlying condition with diabetic chronic kidney disease: Secondary | ICD-10-CM | POA: Diagnosis not present

## 2017-07-22 DIAGNOSIS — E0822 Diabetes mellitus due to underlying condition with diabetic chronic kidney disease: Secondary | ICD-10-CM | POA: Diagnosis not present

## 2017-07-23 DIAGNOSIS — E0822 Diabetes mellitus due to underlying condition with diabetic chronic kidney disease: Secondary | ICD-10-CM | POA: Diagnosis not present

## 2017-07-24 DIAGNOSIS — E0822 Diabetes mellitus due to underlying condition with diabetic chronic kidney disease: Secondary | ICD-10-CM | POA: Diagnosis not present

## 2017-07-25 DIAGNOSIS — E0822 Diabetes mellitus due to underlying condition with diabetic chronic kidney disease: Secondary | ICD-10-CM | POA: Diagnosis not present

## 2017-07-26 DIAGNOSIS — E0822 Diabetes mellitus due to underlying condition with diabetic chronic kidney disease: Secondary | ICD-10-CM | POA: Diagnosis not present

## 2017-07-27 DIAGNOSIS — E0822 Diabetes mellitus due to underlying condition with diabetic chronic kidney disease: Secondary | ICD-10-CM | POA: Diagnosis not present

## 2017-07-28 DIAGNOSIS — E0822 Diabetes mellitus due to underlying condition with diabetic chronic kidney disease: Secondary | ICD-10-CM | POA: Diagnosis not present

## 2017-07-29 DIAGNOSIS — E0822 Diabetes mellitus due to underlying condition with diabetic chronic kidney disease: Secondary | ICD-10-CM | POA: Diagnosis not present

## 2017-07-30 DIAGNOSIS — E0822 Diabetes mellitus due to underlying condition with diabetic chronic kidney disease: Secondary | ICD-10-CM | POA: Diagnosis not present

## 2017-07-31 DIAGNOSIS — G4733 Obstructive sleep apnea (adult) (pediatric): Secondary | ICD-10-CM | POA: Diagnosis not present

## 2017-07-31 DIAGNOSIS — E0822 Diabetes mellitus due to underlying condition with diabetic chronic kidney disease: Secondary | ICD-10-CM | POA: Diagnosis not present

## 2017-08-01 ENCOUNTER — Encounter: Payer: Self-pay | Admitting: Gastroenterology

## 2017-08-01 ENCOUNTER — Ambulatory Visit (INDEPENDENT_AMBULATORY_CARE_PROVIDER_SITE_OTHER): Payer: Medicare HMO | Admitting: Gastroenterology

## 2017-08-01 VITALS — BP 126/72 | HR 87 | Ht 65.5 in | Wt 171.2 lb

## 2017-08-01 DIAGNOSIS — I10 Essential (primary) hypertension: Secondary | ICD-10-CM | POA: Diagnosis not present

## 2017-08-01 DIAGNOSIS — E559 Vitamin D deficiency, unspecified: Secondary | ICD-10-CM | POA: Diagnosis not present

## 2017-08-01 DIAGNOSIS — E119 Type 2 diabetes mellitus without complications: Secondary | ICD-10-CM | POA: Diagnosis not present

## 2017-08-01 DIAGNOSIS — D691 Qualitative platelet defects: Secondary | ICD-10-CM | POA: Diagnosis not present

## 2017-08-01 DIAGNOSIS — Z6828 Body mass index (BMI) 28.0-28.9, adult: Secondary | ICD-10-CM | POA: Diagnosis not present

## 2017-08-01 DIAGNOSIS — M940 Chondrocostal junction syndrome [Tietze]: Secondary | ICD-10-CM | POA: Diagnosis not present

## 2017-08-01 DIAGNOSIS — E669 Obesity, unspecified: Secondary | ICD-10-CM | POA: Diagnosis not present

## 2017-08-01 DIAGNOSIS — Z79899 Other long term (current) drug therapy: Secondary | ICD-10-CM | POA: Diagnosis not present

## 2017-08-01 DIAGNOSIS — K5900 Constipation, unspecified: Secondary | ICD-10-CM

## 2017-08-01 DIAGNOSIS — J45909 Unspecified asthma, uncomplicated: Secondary | ICD-10-CM | POA: Diagnosis not present

## 2017-08-01 DIAGNOSIS — J309 Allergic rhinitis, unspecified: Secondary | ICD-10-CM | POA: Diagnosis not present

## 2017-08-01 DIAGNOSIS — E0822 Diabetes mellitus due to underlying condition with diabetic chronic kidney disease: Secondary | ICD-10-CM | POA: Diagnosis not present

## 2017-08-01 NOTE — Progress Notes (Signed)
IMPRESSION and PLAN:    Constipation. We will continue taking current medications. Patient will call us with the latest list of medicines. Increase water intake. Minimize pain medications including nonsteroidals. High-fiber diet. Patient is to call us if still with problems.  All the contact numbers were given.  Awaiting de-conversion of  Records from [place]. Discussed with patient's daughter in detail.      HPI:    Chief Complaint:    Patient is a very pleasant 72 year old female here for follow-up for constipation.  She has been doing much better.  She has increased water intake.  She has been taking some "pill" which has helped her a lot.  She could not remember the name of the medication.  She denies having any melena or hematochezia.  Overall, she is doing much better.  She would does not want any GI workup.    Review of systems:    Review of Systems:  Constitutional: Denies fever, chills, diaphoresis, appetite change and fatigue.  HEENT: Denies photophobia, eye pain, redness, hearing loss, ear pain, congestion, sore throat, rhinorrhea, sneezing, mouth sores, neck pain, neck stiffness and tinnitus.   Respiratory: Denies SOB, DOE, cough, chest tightness,  and wheezing.   Cardiovascular: Denies chest pain, palpitations and leg swelling.  Genitourinary: Denies dysuria, urgency, frequency, hematuria, flank pain and difficulty urinating.  Musculoskeletal: Denies myalgias, back pain, joint swelling, arthralgias and gait problem.  Skin: No rash. .  Neurological: Denies dizziness, seizures, syncope, weakness, light-headedness, numbness and headaches.  Hematological: Denies adenopathy. Easy bruising, personal or family bleeding history  Psychiatric/Behavioral: No anxiety or depression     Past Medical History:  Diagnosis Date  . Angina    normal stress test  . Bronchitis   . Coronary artery disease involving native coronary artery of native heart without angina  pectoris 06/10/2015  . Diabetes mellitus   . Diabetes mellitus due to underlying condition with unspecified complications ([place]) 05/13/4006  . Dyslipidemia 06/10/2015  . Essential hypertension 06/10/2015  . GERD (gastroesophageal reflux disease)   . H/O hiatal hernia   . Heart murmur   . Hyperlipemia   . Hypertension   . Nonspecific (abnormal) findings on radiological and other examination of gastrointestinal tract 05/19/2011  . Pain and swelling of toe of right foot 07/15/2016  . Palpitations 06/02/2016  . Shortness of breath    on  excertion    Current Outpatient Medications  Medication Sig Dispense Refill  . amlodipine-atorvastatin (CADUET) 10-10 MG tablet Take 1 tablet by mouth daily.    Marland Kitchen albuterol (PROVENTIL HFA;VENTOLIN HFA) 108 (90 Base) MCG/ACT inhaler Inhale 2 puffs into the lungs every 6 (six) hours as needed for wheezing or shortness of breath. 3 Inhaler 3  . Albuterol (VENTOLIN IN) Inhale 2 puffs into the lungs every 6 (six) hours as needed (wheezing).     Marland Kitchen amLODipine (NORVASC) 10 MG tablet Take 10 mg by mouth daily.    Marland Kitchen aspirin 81 MG tablet Take 81 mg by mouth daily.     . BD PEN NEEDLE NANO U/F 32G X 4 MM MISC     . buPROPion (WELLBUTRIN XL) 150 MG 24 hr tablet Take 150 mg by mouth daily.    . Cholecalciferol (VITAMIN D PO) Take 5,000 Units by mouth 2 (two) times daily.     Marland Kitchen EPIPEN 2-PAK 0.3 MG/0.3ML SOAJ injection     . escitalopram (LEXAPRO) 10 MG tablet Take 10 mg by mouth daily.    Marland Kitchen  fluticasone (FLONASE) 50 MCG/ACT nasal spray     . fluticasone (VERAMYST) 27.5 MCG/SPRAY nasal spray Place 2 sprays into the nose daily.    . hydrALAZINE (APRESOLINE) 25 MG tablet Take 37.5 mg by mouth 3 (three) times daily.    . isosorbide mononitrate (IMDUR) 60 MG 24 hr tablet Take 60 mg by mouth daily.    . Lancets (ACCU-CHEK MULTICLIX) lancets     . losartan-hydrochlorothiazide (HYZAAR) 100-25 MG tablet Take 1 tablet by mouth daily.    . metFORMIN (GLUCOPHAGE) 500 MG tablet Take 500 mg by  mouth 2 (two) times daily with a meal.     . methocarbamol (ROBAXIN) 500 MG tablet Take 500 mg by mouth 2 (two) times daily.    . montelukast (SINGULAIR) 10 MG tablet Take 10 mg by mouth daily.    . Multiple Vitamin (MULTIVITAMIN) capsule Take 1 capsule by mouth daily.      . nitroGLYCERIN (NITROSTAT) 0.4 MG SL tablet Place 1 tablet under the tongue every 5 (five) minutes x 3 doses as needed for chest pain.    Marland Kitchen omeprazole (PRILOSEC) 20 MG capsule Take 20 mg by mouth 2 (two) times daily before a meal.     . simvastatin (ZOCOR) 20 MG tablet Take 20 mg by mouth every evening.      . traMADol (ULTRAM) 50 MG tablet Take 50 mg by mouth 2 (two) times daily as needed for pain.    Marland Kitchen VICTOZA 18 MG/3ML SOPN 12 mg daily.      No current facility-administered medications for this visit.     Patient's surgical history, family medical history, social history and allergies were all reviewed in Epic    Physical Exam:     BP 126/72   Pulse 87   Ht 5' 5.5" (1.664 m)   Wt 171 lb 4 oz (77.7 kg)   BMI 28.06 kg/m   GENERAL:  NAD PSYCH: :Pleasant, cooperative, normal affect EENT:  conjunctiva pink, mucous membranes moist, neck supple without masses CARDIAC:  RRR, no murmur heard, + peripheral edema PULM: Normal respiratory effort, lungs CTA bilaterally, no wheezing ABDOMEN:  Nondistended, soft, nontender. No obvious masses, no hepatomegaly,  normal bowel sounds SKIN:  turgor, no lesions seen Musculoskeletal:  Normal muscle tone, normal strength NEURO: Alert and oriented x 3, no focal neurologic deficits   08/01/2017, 10:02 AM

## 2017-08-01 NOTE — Patient Instructions (Signed)
If you are age 74 or older, your body mass index should be between 23-30. Your Body mass index is 28.06 kg/m. If this is out of the aforementioned range listed, please consider follow up with your Primary Care Provider.  If you are age 67 or younger, your body mass index should be between 19-25. Your Body mass index is 28.06 kg/m. If this is out of the aformentioned range listed, please consider follow up with your Primary Care Provider.   Please continue all your medications.  Thank you,  Dr. Jackquline Denmark

## 2017-08-02 ENCOUNTER — Telehealth: Payer: Self-pay | Admitting: Gastroenterology

## 2017-08-02 DIAGNOSIS — E0822 Diabetes mellitus due to underlying condition with diabetic chronic kidney disease: Secondary | ICD-10-CM | POA: Diagnosis not present

## 2017-08-02 NOTE — Telephone Encounter (Signed)
Pt called back to let us know she takes prilosec 20mg  BID and Zantac 300mg  BID, med list updated.

## 2017-08-03 DIAGNOSIS — E0822 Diabetes mellitus due to underlying condition with diabetic chronic kidney disease: Secondary | ICD-10-CM | POA: Diagnosis not present

## 2017-08-04 DIAGNOSIS — E0822 Diabetes mellitus due to underlying condition with diabetic chronic kidney disease: Secondary | ICD-10-CM | POA: Diagnosis not present

## 2017-08-05 DIAGNOSIS — E0822 Diabetes mellitus due to underlying condition with diabetic chronic kidney disease: Secondary | ICD-10-CM | POA: Diagnosis not present

## 2017-08-06 DIAGNOSIS — E0822 Diabetes mellitus due to underlying condition with diabetic chronic kidney disease: Secondary | ICD-10-CM | POA: Diagnosis not present

## 2017-08-07 DIAGNOSIS — E0822 Diabetes mellitus due to underlying condition with diabetic chronic kidney disease: Secondary | ICD-10-CM | POA: Diagnosis not present

## 2017-08-08 DIAGNOSIS — E0822 Diabetes mellitus due to underlying condition with diabetic chronic kidney disease: Secondary | ICD-10-CM | POA: Diagnosis not present

## 2017-08-09 DIAGNOSIS — E0822 Diabetes mellitus due to underlying condition with diabetic chronic kidney disease: Secondary | ICD-10-CM | POA: Diagnosis not present

## 2017-08-10 DIAGNOSIS — E0822 Diabetes mellitus due to underlying condition with diabetic chronic kidney disease: Secondary | ICD-10-CM | POA: Diagnosis not present

## 2017-08-11 DIAGNOSIS — E0822 Diabetes mellitus due to underlying condition with diabetic chronic kidney disease: Secondary | ICD-10-CM | POA: Diagnosis not present

## 2017-08-12 DIAGNOSIS — E0822 Diabetes mellitus due to underlying condition with diabetic chronic kidney disease: Secondary | ICD-10-CM | POA: Diagnosis not present

## 2017-08-13 DIAGNOSIS — E0822 Diabetes mellitus due to underlying condition with diabetic chronic kidney disease: Secondary | ICD-10-CM | POA: Diagnosis not present

## 2017-08-14 DIAGNOSIS — E0822 Diabetes mellitus due to underlying condition with diabetic chronic kidney disease: Secondary | ICD-10-CM | POA: Diagnosis not present

## 2017-08-15 DIAGNOSIS — E0822 Diabetes mellitus due to underlying condition with diabetic chronic kidney disease: Secondary | ICD-10-CM | POA: Diagnosis not present

## 2017-08-16 DIAGNOSIS — E0822 Diabetes mellitus due to underlying condition with diabetic chronic kidney disease: Secondary | ICD-10-CM | POA: Diagnosis not present

## 2017-08-17 DIAGNOSIS — E0822 Diabetes mellitus due to underlying condition with diabetic chronic kidney disease: Secondary | ICD-10-CM | POA: Diagnosis not present

## 2017-08-18 DIAGNOSIS — E0822 Diabetes mellitus due to underlying condition with diabetic chronic kidney disease: Secondary | ICD-10-CM | POA: Diagnosis not present

## 2017-08-19 DIAGNOSIS — E0822 Diabetes mellitus due to underlying condition with diabetic chronic kidney disease: Secondary | ICD-10-CM | POA: Diagnosis not present

## 2017-08-20 DIAGNOSIS — E0822 Diabetes mellitus due to underlying condition with diabetic chronic kidney disease: Secondary | ICD-10-CM | POA: Diagnosis not present

## 2017-08-21 DIAGNOSIS — E0822 Diabetes mellitus due to underlying condition with diabetic chronic kidney disease: Secondary | ICD-10-CM | POA: Diagnosis not present

## 2017-08-22 DIAGNOSIS — E0822 Diabetes mellitus due to underlying condition with diabetic chronic kidney disease: Secondary | ICD-10-CM | POA: Diagnosis not present

## 2017-08-23 DIAGNOSIS — E0822 Diabetes mellitus due to underlying condition with diabetic chronic kidney disease: Secondary | ICD-10-CM | POA: Diagnosis not present

## 2017-08-24 DIAGNOSIS — E0822 Diabetes mellitus due to underlying condition with diabetic chronic kidney disease: Secondary | ICD-10-CM | POA: Diagnosis not present

## 2017-08-25 DIAGNOSIS — E0822 Diabetes mellitus due to underlying condition with diabetic chronic kidney disease: Secondary | ICD-10-CM | POA: Diagnosis not present

## 2017-08-26 DIAGNOSIS — E0822 Diabetes mellitus due to underlying condition with diabetic chronic kidney disease: Secondary | ICD-10-CM | POA: Diagnosis not present

## 2017-08-27 DIAGNOSIS — E0822 Diabetes mellitus due to underlying condition with diabetic chronic kidney disease: Secondary | ICD-10-CM | POA: Diagnosis not present

## 2017-08-28 DIAGNOSIS — E0822 Diabetes mellitus due to underlying condition with diabetic chronic kidney disease: Secondary | ICD-10-CM | POA: Diagnosis not present

## 2017-08-29 DIAGNOSIS — E0822 Diabetes mellitus due to underlying condition with diabetic chronic kidney disease: Secondary | ICD-10-CM | POA: Diagnosis not present

## 2017-08-30 DIAGNOSIS — E0822 Diabetes mellitus due to underlying condition with diabetic chronic kidney disease: Secondary | ICD-10-CM | POA: Diagnosis not present

## 2017-08-31 DIAGNOSIS — G4733 Obstructive sleep apnea (adult) (pediatric): Secondary | ICD-10-CM | POA: Diagnosis not present

## 2017-08-31 DIAGNOSIS — E0822 Diabetes mellitus due to underlying condition with diabetic chronic kidney disease: Secondary | ICD-10-CM | POA: Diagnosis not present

## 2017-09-01 DIAGNOSIS — E0822 Diabetes mellitus due to underlying condition with diabetic chronic kidney disease: Secondary | ICD-10-CM | POA: Diagnosis not present

## 2017-09-02 DIAGNOSIS — E0822 Diabetes mellitus due to underlying condition with diabetic chronic kidney disease: Secondary | ICD-10-CM | POA: Diagnosis not present

## 2017-09-03 DIAGNOSIS — E0822 Diabetes mellitus due to underlying condition with diabetic chronic kidney disease: Secondary | ICD-10-CM | POA: Diagnosis not present

## 2017-09-04 DIAGNOSIS — E0822 Diabetes mellitus due to underlying condition with diabetic chronic kidney disease: Secondary | ICD-10-CM | POA: Diagnosis not present

## 2017-09-04 DIAGNOSIS — K644 Residual hemorrhoidal skin tags: Secondary | ICD-10-CM | POA: Diagnosis not present

## 2017-09-04 DIAGNOSIS — J309 Allergic rhinitis, unspecified: Secondary | ICD-10-CM | POA: Diagnosis not present

## 2017-09-04 DIAGNOSIS — E663 Overweight: Secondary | ICD-10-CM | POA: Diagnosis not present

## 2017-09-04 DIAGNOSIS — Z6827 Body mass index (BMI) 27.0-27.9, adult: Secondary | ICD-10-CM | POA: Diagnosis not present

## 2017-09-05 DIAGNOSIS — H9313 Tinnitus, bilateral: Secondary | ICD-10-CM | POA: Diagnosis not present

## 2017-09-05 DIAGNOSIS — E0822 Diabetes mellitus due to underlying condition with diabetic chronic kidney disease: Secondary | ICD-10-CM | POA: Diagnosis not present

## 2017-09-06 DIAGNOSIS — E0822 Diabetes mellitus due to underlying condition with diabetic chronic kidney disease: Secondary | ICD-10-CM | POA: Diagnosis not present

## 2017-09-07 DIAGNOSIS — E0822 Diabetes mellitus due to underlying condition with diabetic chronic kidney disease: Secondary | ICD-10-CM | POA: Diagnosis not present

## 2017-09-08 DIAGNOSIS — E0822 Diabetes mellitus due to underlying condition with diabetic chronic kidney disease: Secondary | ICD-10-CM | POA: Diagnosis not present

## 2017-09-09 DIAGNOSIS — E0822 Diabetes mellitus due to underlying condition with diabetic chronic kidney disease: Secondary | ICD-10-CM | POA: Diagnosis not present

## 2017-09-10 DIAGNOSIS — E0822 Diabetes mellitus due to underlying condition with diabetic chronic kidney disease: Secondary | ICD-10-CM | POA: Diagnosis not present

## 2017-09-11 DIAGNOSIS — E0822 Diabetes mellitus due to underlying condition with diabetic chronic kidney disease: Secondary | ICD-10-CM | POA: Diagnosis not present

## 2017-09-12 DIAGNOSIS — E0822 Diabetes mellitus due to underlying condition with diabetic chronic kidney disease: Secondary | ICD-10-CM | POA: Diagnosis not present

## 2017-09-13 DIAGNOSIS — E0822 Diabetes mellitus due to underlying condition with diabetic chronic kidney disease: Secondary | ICD-10-CM | POA: Diagnosis not present

## 2017-09-14 ENCOUNTER — Ambulatory Visit: Payer: Medicare HMO | Admitting: Pulmonary Disease

## 2017-09-14 DIAGNOSIS — E0822 Diabetes mellitus due to underlying condition with diabetic chronic kidney disease: Secondary | ICD-10-CM | POA: Diagnosis not present

## 2017-09-15 DIAGNOSIS — E0822 Diabetes mellitus due to underlying condition with diabetic chronic kidney disease: Secondary | ICD-10-CM | POA: Diagnosis not present

## 2017-09-16 DIAGNOSIS — E0822 Diabetes mellitus due to underlying condition with diabetic chronic kidney disease: Secondary | ICD-10-CM | POA: Diagnosis not present

## 2017-09-17 DIAGNOSIS — E0822 Diabetes mellitus due to underlying condition with diabetic chronic kidney disease: Secondary | ICD-10-CM | POA: Diagnosis not present

## 2017-09-18 ENCOUNTER — Encounter: Payer: Self-pay | Admitting: Pulmonary Disease

## 2017-09-18 ENCOUNTER — Ambulatory Visit (INDEPENDENT_AMBULATORY_CARE_PROVIDER_SITE_OTHER): Payer: Medicare HMO | Admitting: Pulmonary Disease

## 2017-09-18 VITALS — BP 135/84 | HR 84 | Ht 65.5 in | Wt 170.0 lb

## 2017-09-18 DIAGNOSIS — G4733 Obstructive sleep apnea (adult) (pediatric): Secondary | ICD-10-CM | POA: Diagnosis not present

## 2017-09-18 DIAGNOSIS — J45990 Exercise induced bronchospasm: Secondary | ICD-10-CM | POA: Diagnosis not present

## 2017-09-18 DIAGNOSIS — R0600 Dyspnea, unspecified: Secondary | ICD-10-CM

## 2017-09-18 DIAGNOSIS — E0822 Diabetes mellitus due to underlying condition with diabetic chronic kidney disease: Secondary | ICD-10-CM | POA: Diagnosis not present

## 2017-09-18 DIAGNOSIS — R0602 Shortness of breath: Secondary | ICD-10-CM

## 2017-09-18 NOTE — Patient Instructions (Signed)
Shortness of breath: We will check a high-resolution CT scan of the chest We will check a complete blood count to evaluate for anemia Take Symbicort 2 puffs twice a day no matter how you feel with a spacer Follow-up with a nurse practitioner in 2 to 4 weeks to go over these tests and see how you are doing on Symbicort

## 2017-09-18 NOTE — Progress Notes (Signed)
Subjective:   PATIENT ID: Deborah Hutchinson GENDER: female DOB: Nov 06, 1945, MRN: [id number]  Synopsis: Referred to see Dr. Elsworth Soho in 2018 for dyspnea, possible Asthma/COPD though she has normal pulmonary function testing.  Smoked 2 ppd for 50 years  HPI  Chief Complaint  Patient presents with  . Follow-up    pt c/o unchanged dyspnea, headaches   Deborah Hutchinson says that she continues to struggle from dyspnea.  This is sometimes accompanied by a headache.  In fact she has a headache nearly every day.  She reports several days where she feels short of breath.  She takes symbicort one puff twice day.  She has been taking the medicine for three weeks.   Past Medical History:  Diagnosis Date  . Angina    normal stress test  . Bronchitis   . 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 Diabetes mellitus   . Diabetes mellitus due to underlying condition with unspecified complications ([place]) 11/11/1023  . Dyslipidemia 06/10/2015  . Essential hypertension 06/10/2015  . GERD (gastroesophageal reflux disease)   . H/O hiatal hernia   . Heart murmur   . Hyperlipemia   . Hypertension   . Nonspecific (abnormal) findings on radiological and other examination of gastrointestinal tract 05/19/2011  . Pain and swelling of toe of right foot 07/15/2016  . Palpitations 06/02/2016  . Shortness of breath    on  excertion      Review of Systems  Constitutional: Positive for malaise/fatigue. Negative for chills.  HENT: Positive for congestion. Negative for ear discharge and sore throat.   Respiratory: Positive for shortness of breath. Negative for cough and wheezing.   Cardiovascular: Negative for chest pain, claudication and leg swelling.  Neurological: Negative for weakness.      Objective:  Physical Exam   Vitals:   09/18/17 1511  BP: 135/84  Pulse: 84  SpO2: 98%  Weight: 170 lb (77.1 kg)  Height: 5' 5.5" (1.664 m)  RA  Gen: well  appearing HENT: OP clear, TM's clear, neck supple PULM: CTA B, normal percussion CV: RRR, no mgr, trace edema GI: BS+, soft, nontender Derm: no cyanosis or rash Psyche: normal mood and affect   CBC No results found for: WBC, RBC, HGB, HCT, PLT, MCV, MCH, MCHC, RDW, LYMPHSABS, MONOABS, EOSABS, BASOSABS   Chest imaging: 11/2016 CT angiogram chest: stable 34mm nodule (compared to 05/2016), no PE images reviewed, no pulmonary parenchymal abnormality seen.  PFT: April 2018 ratio 90%, FVC 2.22 L 87% predicted, total lung capacity 5.26 L 115% predicted, DLCO 14.18 mL 64% predicted December 2018 cardiopulmonary exercise test spirometry ratio 70%, FEV1 1.81 L 76% predicted, FEV1 post 1.64 L, 9% change: VO2 max 12.1 mL/kg/min, 83% predicted, heart rate maximum exercise 101, ventilatory max at exercise approximately 50% of maximum with exercise ventilatory capacity  O2 saturation 99%  Labs:  Path:  Echo: November 2018 transthoracic echocardiogram LVEF 60-65%, RV normal, PA pressure estimate normal  Heart Catheterization:  Record review: Records from in April 2018 visit with [facility] cardiology reviewed where she was felt to have coronary artery disease that did not need intervention.     Assessment & Plan:   No diagnosis found.  Discussion: Danice went through an extensive evaluation for shortness of breath in 2019 which was unrevealing.  An echocardiogram did not show evidence of pulmonary hypertension, a cardiopulmonary exercise test showed limited exercise capacity but no definitive heart or lung abnormality.  CT scanning of her chest in 2017 did not show pulmonary embolism.  It is difficult for me to say what is causing her shortness of breath.  There is very little objective abnormality.  She did have an isolated decreased diffusion capacity on lung function testing in 2018 so we will check a hemoglobin value.  It is not clear to me that this will be revealing but we will also check a  high-resolution CT scan of the chest to make sure there is no evidence of a subtle pulmonary parenchymal abnormality.  If she has asthma the only manifestation of it would be some hyperinflation seen on the PFT.  I certainly do not see any airflow obstruction on lung function testing.  We will have her take Symbicort routinely to see if this is helpful.  I think the most likely explanation is deconditioning.  Plan: Shortness of breath: We will check a high-resolution CT scan of the chest We will check a complete blood count to evaluate for anemia Take Symbicort 2 puffs twice a day no matter how you feel with a spacer Follow-up with a nurse practitioner in 2 to 4 weeks to go over these tests and see how you are doing on Symbicort   Current Outpatient Medications:  .  albuterol (PROVENTIL HFA;VENTOLIN HFA) 108 (90 Base) MCG/ACT inhaler, Inhale 2 puffs into the lungs every 6 (six) hours as needed for wheezing or shortness of breath., Disp: 3 Inhaler, Rfl: 3 .  Albuterol (VENTOLIN IN), Inhale 2 puffs into the lungs every 6 (six) hours as needed (wheezing). , Disp: , Rfl:  .  amLODipine (NORVASC) 10 MG tablet, Take 10 mg by mouth daily., Disp: , Rfl:  .  amlodipine-atorvastatin (CADUET) 10-10 MG tablet, Take 1 tablet by mouth daily., Disp: , Rfl:  .  aspirin 81 MG tablet, Take 81 mg by mouth daily. , Disp: , Rfl:  .  BD PEN NEEDLE NANO U/F 32G X 4 MM MISC, , Disp: , Rfl:  .  buPROPion (WELLBUTRIN XL) 150 MG 24 hr tablet, Take 150 mg by mouth daily., Disp: , Rfl:  .  Cholecalciferol (VITAMIN D PO), Take 5,000 Units by mouth 2 (two) times daily. , Disp: , Rfl:  .  EPIPEN 2-PAK 0.3 MG/0.3ML SOAJ injection, , Disp: , Rfl:  .  escitalopram (LEXAPRO) 10 MG tablet, Take 10 mg by mouth daily., Disp: , Rfl:  .  fluticasone (FLONASE) 50 MCG/ACT nasal spray, , Disp: , Rfl:  .  fluticasone (VERAMYST) 27.5 MCG/SPRAY nasal spray, Place 2 sprays into the nose daily., Disp: , Rfl:  .  hydrALAZINE (APRESOLINE)  25 MG tablet, Take 37.5 mg by mouth 3 (three) times daily., Disp: , Rfl:  .  isosorbide mononitrate (IMDUR) 60 MG 24 hr tablet, Take 60 mg by mouth daily., Disp: , Rfl:  .  Lancets (ACCU-CHEK MULTICLIX) lancets, , Disp: , Rfl:  .  losartan-hydrochlorothiazide (HYZAAR) 100-25 MG tablet, Take 1 tablet by mouth daily., Disp: , Rfl:  .  metFORMIN (GLUCOPHAGE) 500 MG tablet, Take 500 mg by mouth 2 (two) times daily with a meal. , Disp: , Rfl:  .  methocarbamol (ROBAXIN) 500 MG tablet, Take 500 mg by mouth 2 (two) times daily., Disp: , Rfl:  .  montelukast (SINGULAIR) 10 MG tablet, Take 10 mg by mouth daily., Disp: , Rfl:  .  Multiple Vitamin (MULTIVITAMIN) capsule, Take 1 capsule by mouth daily.  , Disp: , Rfl:  .  nitroGLYCERIN (NITROSTAT) 0.4 MG SL  tablet, Place 1 tablet under the tongue every 5 (five) minutes x 3 doses as needed for chest pain., Disp: , Rfl:  .  omeprazole (PRILOSEC) 20 MG capsule, Take 20 mg by mouth 2 (two) times daily before a meal. , Disp: , Rfl:  .  ranitidine (ZANTAC) 300 MG tablet, Take 1 tablet (300 mg total) by mouth 2 (two) times daily., Disp: , Rfl:  .  simvastatin (ZOCOR) 20 MG tablet, Take 20 mg by mouth every evening.  , Disp: , Rfl:  .  traMADol (ULTRAM) 50 MG tablet, Take 50 mg by mouth 2 (two) times daily as needed for pain., Disp: , Rfl:  .  VICTOZA 18 MG/3ML SOPN, 12 mg daily. , Disp: , Rfl:

## 2017-09-19 DIAGNOSIS — E0822 Diabetes mellitus due to underlying condition with diabetic chronic kidney disease: Secondary | ICD-10-CM | POA: Diagnosis not present

## 2017-09-19 LAB — CBC WITH DIFFERENTIAL/PLATELET
BASOS: 0 %
Basophils Absolute: 0 10*3/uL (ref 0.0–0.2)
EOS (ABSOLUTE): 0.2 10*3/uL (ref 0.0–0.4)
EOS: 2 %
HEMATOCRIT: 38 % (ref 34.0–46.6)
Hemoglobin: 12.2 g/dL (ref 11.1–15.9)
IMMATURE GRANS (ABS): 0 10*3/uL (ref 0.0–0.1)
IMMATURE GRANULOCYTES: 0 %
LYMPHS: 32 %
Lymphocytes Absolute: 2.5 10*3/uL (ref 0.7–3.1)
MCH: 28.6 pg (ref 26.6–33.0)
MCHC: 32.1 g/dL (ref 31.5–35.7)
MCV: 89 fL (ref 79–97)
MONOCYTES: 8 %
Monocytes Absolute: 0.6 10*3/uL (ref 0.1–0.9)
NEUTROS PCT: 58 %
Neutrophils Absolute: 4.5 10*3/uL (ref 1.4–7.0)
PLATELETS: 430 10*3/uL — AB (ref 150–379)
RBC: 4.27 x10E6/uL (ref 3.77–5.28)
RDW: 14.3 % (ref 12.3–15.4)
WBC: 7.9 10*3/uL (ref 3.4–10.8)

## 2017-09-20 DIAGNOSIS — E0822 Diabetes mellitus due to underlying condition with diabetic chronic kidney disease: Secondary | ICD-10-CM | POA: Diagnosis not present

## 2017-09-21 ENCOUNTER — Telehealth: Payer: Self-pay | Admitting: Pulmonary Disease

## 2017-09-21 DIAGNOSIS — E0822 Diabetes mellitus due to underlying condition with diabetic chronic kidney disease: Secondary | ICD-10-CM | POA: Diagnosis not present

## 2017-09-21 NOTE — Telephone Encounter (Signed)
Notes recorded by Juanito Doom, MD on 09/19/2017 at 9:05 AM EDT A, Please let the patient know this was OK Thanks, B  Advised pt of results. Pt understood and nothing further is needed.

## 2017-09-22 DIAGNOSIS — E0822 Diabetes mellitus due to underlying condition with diabetic chronic kidney disease: Secondary | ICD-10-CM | POA: Diagnosis not present

## 2017-09-23 DIAGNOSIS — E0822 Diabetes mellitus due to underlying condition with diabetic chronic kidney disease: Secondary | ICD-10-CM | POA: Diagnosis not present

## 2017-09-24 DIAGNOSIS — E0822 Diabetes mellitus due to underlying condition with diabetic chronic kidney disease: Secondary | ICD-10-CM | POA: Diagnosis not present

## 2017-09-25 DIAGNOSIS — E0822 Diabetes mellitus due to underlying condition with diabetic chronic kidney disease: Secondary | ICD-10-CM | POA: Diagnosis not present

## 2017-09-26 DIAGNOSIS — E0822 Diabetes mellitus due to underlying condition with diabetic chronic kidney disease: Secondary | ICD-10-CM | POA: Diagnosis not present

## 2017-09-27 ENCOUNTER — Ambulatory Visit (HOSPITAL_BASED_OUTPATIENT_CLINIC_OR_DEPARTMENT_OTHER)
Admission: RE | Admit: 2017-09-27 | Discharge: 2017-09-27 | Disposition: A | Discharge status: Home / Self Care | Payer: Medicare HMO | Source: Ambulatory Visit | Attending: Pulmonary Disease | Admitting: Pulmonary Disease

## 2017-09-27 DIAGNOSIS — R918 Other nonspecific abnormal finding of lung field: Secondary | ICD-10-CM | POA: Diagnosis not present

## 2017-09-27 DIAGNOSIS — I700 Atherosclerosis of aorta: Secondary | ICD-10-CM | POA: Diagnosis not present

## 2017-09-27 DIAGNOSIS — I2510 Atherosclerotic heart disease of native coronary artery without angina pectoris: Secondary | ICD-10-CM | POA: Insufficient documentation

## 2017-09-27 DIAGNOSIS — R0602 Shortness of breath: Secondary | ICD-10-CM | POA: Diagnosis not present

## 2017-09-27 DIAGNOSIS — E0822 Diabetes mellitus due to underlying condition with diabetic chronic kidney disease: Secondary | ICD-10-CM | POA: Diagnosis not present

## 2017-09-28 DIAGNOSIS — D509 Iron deficiency anemia, unspecified: Secondary | ICD-10-CM | POA: Diagnosis not present

## 2017-09-28 DIAGNOSIS — E0822 Diabetes mellitus due to underlying condition with diabetic chronic kidney disease: Secondary | ICD-10-CM | POA: Diagnosis not present

## 2017-09-29 DIAGNOSIS — E0822 Diabetes mellitus due to underlying condition with diabetic chronic kidney disease: Secondary | ICD-10-CM | POA: Diagnosis not present

## 2017-09-30 DIAGNOSIS — G4733 Obstructive sleep apnea (adult) (pediatric): Secondary | ICD-10-CM | POA: Diagnosis not present

## 2017-09-30 DIAGNOSIS — E0822 Diabetes mellitus due to underlying condition with diabetic chronic kidney disease: Secondary | ICD-10-CM | POA: Diagnosis not present

## 2017-10-01 DIAGNOSIS — E0822 Diabetes mellitus due to underlying condition with diabetic chronic kidney disease: Secondary | ICD-10-CM | POA: Diagnosis not present

## 2017-10-02 DIAGNOSIS — E0822 Diabetes mellitus due to underlying condition with diabetic chronic kidney disease: Secondary | ICD-10-CM | POA: Diagnosis not present

## 2017-10-03 DIAGNOSIS — E0822 Diabetes mellitus due to underlying condition with diabetic chronic kidney disease: Secondary | ICD-10-CM | POA: Diagnosis not present

## 2017-10-04 DIAGNOSIS — E0822 Diabetes mellitus due to underlying condition with diabetic chronic kidney disease: Secondary | ICD-10-CM | POA: Diagnosis not present

## 2017-10-05 ENCOUNTER — Ambulatory Visit (INDEPENDENT_AMBULATORY_CARE_PROVIDER_SITE_OTHER): Payer: Medicare HMO | Admitting: Adult Health

## 2017-10-05 ENCOUNTER — Encounter: Payer: Self-pay | Admitting: Adult Health

## 2017-10-05 DIAGNOSIS — J4520 Mild intermittent asthma, uncomplicated: Secondary | ICD-10-CM

## 2017-10-05 DIAGNOSIS — J45909 Unspecified asthma, uncomplicated: Secondary | ICD-10-CM | POA: Insufficient documentation

## 2017-10-05 DIAGNOSIS — E0822 Diabetes mellitus due to underlying condition with diabetic chronic kidney disease: Secondary | ICD-10-CM | POA: Diagnosis not present

## 2017-10-05 NOTE — Assessment & Plan Note (Signed)
Improved symptom control on Symbicort. CT chest with no evidence of ILD Encouraged on advancing activity as tolerated to help with her deconditioning  Plan  Patient Instructions  Continue on Symbicort 2 puffs Twice daily  , rinse after use .  May use Albuterol As needed  Wheezing /shortness of breath  Follow up Dr. Lake Bells in 2-3 months and As needed  -[facility] .

## 2017-10-05 NOTE — Patient Instructions (Signed)
Continue on Symbicort 2 puffs Twice daily  , rinse after use .  May use Albuterol As needed  Wheezing /shortness of breath  Follow up Dr. Lake Bells in 2-3 months and As needed  -[facility] .

## 2017-10-05 NOTE — Progress Notes (Signed)
Reviewe, agree

## 2017-10-05 NOTE — Progress Notes (Signed)
@Patient  ID: Deborah Hutchinson, female    DOB: 25-Nov-1945, 72 y.o.   MRN: [id number]  Chief Complaint  Patient presents with  . Follow-up    Asthma     Referring provider: [name], MD  HPI: 72 year old female followed for suspected asthma  TES T Chest imaging: 11/2016 CT angiogram chest: stable 82mm nodule (compared to 05/2016), no PE images reviewed, no pulmonary parenchymal abnormality seen.  PFT: April 2018 ratio 90%, FVC 2.22 L 87% predicted, total lung capacity 5.26 L 115% predicted, DLCO 14.18 mL 64% predicted December 2018 cardiopulmonary exercise test spirometry ratio 70%, FEV1 1.81 L 76% predicted, FEV1 post 1.64 L, 9% change: VO2 max 12.1 mL/kg/min, 83% predicted, heart rate maximum exercise 101, ventilatory max at exercise approximately 50% of maximum with exercise ventilatory capacity  O2 saturation 99%  Labs:  Path:  Echo: November 2018 transthoracic echocardiogram LVEF 60-65%, RV normal, PA pressure estimate normal  Heart Catheterization:  Record review: Records from in April 2018 visit with [facility] cardiology reviewed where she was felt to have coronary artery disease that did not need intervention.  10/05/2017 Follow up : Asthma  Patient returns for a 2-week follow-up.  Patient was seen last visit she is underwent an extensive work-up for ongoing shortness of breath.  As above she has underwent multiple tests which seem to indicate that she may have a component of asthma.  Along with the significant deconditioning she was set up for a high-resolution CT chest that showed no ILD.  Minimum air trapping.  She was started on Symbicort.  Patient says that she does feel slightly improved.  She had less episodes of shortness of breath.  She denies any chest pain orthopnea PND or increased leg swelling.  Allergies  Allergen Reactions  . Penicillins Rash    Reaction was 30years ago  Has never taken again    Immunization History  Administered Date(s) Administered  .  Influenza, High Dose Seasonal PF 03/03/2017    Past Medical History:  Diagnosis Date  . Angina    normal stress test  . Bronchitis   . 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 Diabetes mellitus   . Diabetes mellitus due to underlying condition with unspecified complications ([place]) 06/15/9483  . Dyslipidemia 06/10/2015  . Essential hypertension 06/10/2015  . GERD (gastroesophageal reflux disease)   . H/O hiatal hernia   . Heart murmur   . Hyperlipemia   . Hypertension   . Nonspecific (abnormal) findings on radiological and other examination of gastrointestinal tract 05/19/2011  . Pain and swelling of toe of right foot 07/15/2016  . Palpitations 06/02/2016  . Shortness of breath    on  excertion    Tobacco History: Social History   Tobacco Use  Smoking Status Former Smoker  . Packs/day: 1.00  . Last attempt to quit: 05/17/2011  . Years since quitting: 6.3  Smokeless Tobacco Never Used   Counseling given: Not Answered   Outpatient Encounter Medications as of 10/05/2017  Medication Sig  . albuterol (PROVENTIL HFA;VENTOLIN HFA) 108 (90 Base) MCG/ACT inhaler Inhale 2 puffs into the lungs every 6 (six) hours as needed for wheezing or shortness of breath.  . Albuterol (VENTOLIN IN) Inhale 2 puffs into the lungs every 6 (six) hours as needed (wheezing).   Marland Kitchen amLODipine (NORVASC) 10 MG tablet Take 10 mg by mouth daily.  Marland Kitchen amlodipine-atorvastatin (CADUET) 10-10 MG tablet Take 1 tablet by mouth daily.  Marland Kitchen  aspirin 81 MG tablet Take 81 mg by mouth daily.   . BD PEN NEEDLE NANO U/F 32G X 4 MM MISC   . buPROPion (WELLBUTRIN XL) 150 MG 24 hr tablet Take 150 mg by mouth daily.  . Cholecalciferol (VITAMIN D PO) Take 5,000 Units by mouth 2 (two) times daily.   Marland Kitchen EPIPEN 2-PAK 0.3 MG/0.3ML SOAJ injection   . escitalopram (LEXAPRO) 10 MG tablet Take 10 mg by mouth daily.  . fluticasone (FLONASE) 50 MCG/ACT nasal spray   . fluticasone  (VERAMYST) 27.5 MCG/SPRAY nasal spray Place 2 sprays into the nose daily.  . hydrALAZINE (APRESOLINE) 25 MG tablet Take 37.5 mg by mouth 3 (three) times daily.  . isosorbide mononitrate (IMDUR) 60 MG 24 hr tablet Take 60 mg by mouth daily.  . Lancets (ACCU-CHEK MULTICLIX) lancets   . losartan-hydrochlorothiazide (HYZAAR) 100-25 MG tablet Take 1 tablet by mouth daily.  . metFORMIN (GLUCOPHAGE) 500 MG tablet Take 500 mg by mouth 2 (two) times daily with a meal.   . methocarbamol (ROBAXIN) 500 MG tablet Take 500 mg by mouth 2 (two) times daily.  . montelukast (SINGULAIR) 10 MG tablet Take 10 mg by mouth daily.  . Multiple Vitamin (MULTIVITAMIN) capsule Take 1 capsule by mouth daily.    . nitroGLYCERIN (NITROSTAT) 0.4 MG SL tablet Place 1 tablet under the tongue every 5 (five) minutes x 3 doses as needed for chest pain.  Marland Kitchen omeprazole (PRILOSEC) 20 MG capsule Take 20 mg by mouth 2 (two) times daily before a meal.   . ranitidine (ZANTAC) 300 MG tablet Take 1 tablet (300 mg total) by mouth 2 (two) times daily.  . simvastatin (ZOCOR) 20 MG tablet Take 20 mg by mouth every evening.    . traMADol (ULTRAM) 50 MG tablet Take 50 mg by mouth 2 (two) times daily as needed for pain.  Marland Kitchen VICTOZA 18 MG/3ML SOPN 12 mg daily.    No facility-administered encounter medications on file as of 10/05/2017.      Review of Systems  Constitutional:   No  weight loss, night sweats,  Fevers, chills, fatigue, or  lassitude.  HEENT:   No headaches,  Difficulty swallowing,  Tooth/dental problems, or  Sore throat,                No sneezing, itching, ear ache, nasal congestion, post nasal drip,   CV:  No chest pain,  Orthopnea, PND, swelling in lower extremities, anasarca, dizziness, palpitations, syncope.   GI  No heartburn, indigestion, abdominal pain, nausea, vomiting, diarrhea, change in bowel habits, loss of appetite, bloody stools.   Resp:  No chest wall deformity  Skin: no rash or lesions.  GU: no dysuria,  change in color of urine, no urgency or frequency.  No flank pain, no hematuria   MS:  No joint pain or swelling.  No decreased range of motion.  No back pain.    Physical Exam  BP 118/64 (BP Location: Left Arm, Cuff Size: Normal)   Pulse 85   Ht 5\' 5"  (1.651 m)   Wt 168 lb (76.2 kg)   SpO2 100%   BMI 27.96 kg/m   GEN: A/Ox3; pleasant , NAD, elderly    HEENT:  [place]/AT,  EACs-clear, TMs-wnl, NOSE-clear, THROAT-clear, no lesions, no postnasal drip or exudate noted.   NECK:  Supple w/ fair ROM; no JVD; normal carotid impulses w/o bruits; no thyromegaly or nodules palpated; no lymphadenopathy.    RESP  Clear  P & A; w/o,  wheezes/ rales/ or rhonchi. no accessory muscle use, no dullness to percussion  CARD:  RRR, no m/r/g, no peripheral edema, pulses intact, no cyanosis or clubbing.  GI:   Soft & nt; nml bowel sounds; no organomegaly or masses detected.   Musco: Warm bil, no deformities or joint swelling noted.   Neuro: alert, no focal deficits noted.    Skin: Warm, no lesions or rashes    Lab Results:  CBC    Component Value Date/Time   WBC 7.9 09/18/2017 0000   RBC 4.27 09/18/2017 0000   HGB 12.2 09/18/2017 0000   HCT 38.0 09/18/2017 0000   PLT 430 (H) 09/18/2017 0000   MCV 89 09/18/2017 0000   MCH 28.6 09/18/2017 0000   MCHC 32.1 09/18/2017 0000   RDW 14.3 09/18/2017 0000   LYMPHSABS 2.5 09/18/2017 0000   EOSABS 0.2 09/18/2017 0000   BASOSABS 0.0 09/18/2017 0000    BMET No results found for: NA, K, CL, CO2, GLUCOSE, BUN, CREATININE, CALCIUM, GFRNONAA, GFRAA  BNP No results found for: BNP  ProBNP No results found for: PROBNP  Imaging: Ct Chest High Resolution  Result Date: 09/27/2017 CLINICAL DATA:  Chronic shortness of breath.  Chest pain. EXAM: CT CHEST WITHOUT CONTRAST TECHNIQUE: Multidetector CT imaging of the chest was performed following the standard protocol without intravenous contrast. High resolution imaging of the lungs, as well as inspiratory  and expiratory imaging, was performed. COMPARISON:  12/05/2016, 06/02/2016, 09/03/2015. FINDINGS: Cardiovascular: Atherosclerotic calcification of the arterial vasculature, including three-vessel involvement of the coronary arteries and aortic valve. Heart size normal. No pericardial effusion. Mediastinum/Nodes: Mediastinal and axillary lymph nodes are not enlarged by CT size criteria. Hilar regions are difficult to evaluate without IV contrast. Esophagus is grossly unremarkable. Small hiatal hernia. Lungs/Pleura: Negative for subpleural reticulation, traction bronchiectasis/bronchiolectasis, ground-glass, architectural distortion or honeycombing. 4 mm right lower lobe nodule is unchanged from 09/03/2015 and considered benign. Mild bilateral lower lobe scarring. Scattered areas of peribronchovascular ground-glass nodularity are unchanged from 09/03/2015 as well. No pleural fluid. Airway is unremarkable. Minimal air trapping. Upper Abdomen: Visualized portion of the liver is unremarkable. Slight nodular thickening of the right adrenal gland. Left adrenal nodule measures 26 Hounsfield units and 1.9 cm, stable from 09/03/2015. Visualized portions of the kidneys, spleen, pancreas, stomach and bowel are grossly unremarkable with exception of a small hiatal hernia. Upper abdominal lymph nodes are not enlarged by CT size criteria. Musculoskeletal: Degenerative changes in the spine. No worrisome lytic or sclerotic lesions. IMPRESSION: 1. Negative for interstitial lung disease. 2. Minimal air trapping, suggestive small airways disease. 3. Aortic atherosclerosis (ICD10-170.0). Three-vessel coronary artery calcification. 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 Electronically Signed   By: Lorin Picket M.D.   On: 09/27/2017 11:10     Assessment & Plan:   Asthma Improved symptom control on Symbicort. CT chest with no evidence of ILD Encouraged on advancing activity as tolerated to  help with her deconditioning  Plan  Patient Instructions  Continue on Symbicort 2 puffs Twice daily  , rinse after use .  May use Albuterol As needed  Wheezing /shortness of breath  Follow up Dr. Lake Bells in 2-3 months and As needed  -[facility] .           Rexene Edison, NP 10/05/2017

## 2017-10-06 DIAGNOSIS — E0822 Diabetes mellitus due to underlying condition with diabetic chronic kidney disease: Secondary | ICD-10-CM | POA: Diagnosis not present

## 2017-10-07 DIAGNOSIS — E0822 Diabetes mellitus due to underlying condition with diabetic chronic kidney disease: Secondary | ICD-10-CM | POA: Diagnosis not present

## 2017-10-08 DIAGNOSIS — E0822 Diabetes mellitus due to underlying condition with diabetic chronic kidney disease: Secondary | ICD-10-CM | POA: Diagnosis not present

## 2017-10-09 DIAGNOSIS — E0822 Diabetes mellitus due to underlying condition with diabetic chronic kidney disease: Secondary | ICD-10-CM | POA: Diagnosis not present

## 2017-10-10 DIAGNOSIS — E0822 Diabetes mellitus due to underlying condition with diabetic chronic kidney disease: Secondary | ICD-10-CM | POA: Diagnosis not present

## 2017-10-11 DIAGNOSIS — E0822 Diabetes mellitus due to underlying condition with diabetic chronic kidney disease: Secondary | ICD-10-CM | POA: Diagnosis not present

## 2017-10-12 DIAGNOSIS — E0822 Diabetes mellitus due to underlying condition with diabetic chronic kidney disease: Secondary | ICD-10-CM | POA: Diagnosis not present

## 2017-10-13 DIAGNOSIS — E0822 Diabetes mellitus due to underlying condition with diabetic chronic kidney disease: Secondary | ICD-10-CM | POA: Diagnosis not present

## 2017-10-14 DIAGNOSIS — E0822 Diabetes mellitus due to underlying condition with diabetic chronic kidney disease: Secondary | ICD-10-CM | POA: Diagnosis not present

## 2017-10-15 DIAGNOSIS — E0822 Diabetes mellitus due to underlying condition with diabetic chronic kidney disease: Secondary | ICD-10-CM | POA: Diagnosis not present

## 2017-10-16 DIAGNOSIS — E0822 Diabetes mellitus due to underlying condition with diabetic chronic kidney disease: Secondary | ICD-10-CM | POA: Diagnosis not present

## 2017-10-17 DIAGNOSIS — E0822 Diabetes mellitus due to underlying condition with diabetic chronic kidney disease: Secondary | ICD-10-CM | POA: Diagnosis not present

## 2017-10-18 DIAGNOSIS — E0822 Diabetes mellitus due to underlying condition with diabetic chronic kidney disease: Secondary | ICD-10-CM | POA: Diagnosis not present

## 2017-10-19 DIAGNOSIS — E0822 Diabetes mellitus due to underlying condition with diabetic chronic kidney disease: Secondary | ICD-10-CM | POA: Diagnosis not present

## 2017-10-19 DIAGNOSIS — J309 Allergic rhinitis, unspecified: Secondary | ICD-10-CM | POA: Diagnosis not present

## 2017-10-19 DIAGNOSIS — Z6827 Body mass index (BMI) 27.0-27.9, adult: Secondary | ICD-10-CM | POA: Diagnosis not present

## 2017-10-19 DIAGNOSIS — M549 Dorsalgia, unspecified: Secondary | ICD-10-CM | POA: Diagnosis not present

## 2017-10-19 DIAGNOSIS — E663 Overweight: Secondary | ICD-10-CM | POA: Diagnosis not present

## 2017-10-19 DIAGNOSIS — R51 Headache: Secondary | ICD-10-CM | POA: Diagnosis not present

## 2017-10-20 DIAGNOSIS — Z9181 History of falling: Secondary | ICD-10-CM | POA: Diagnosis not present

## 2017-10-20 DIAGNOSIS — E785 Hyperlipidemia, unspecified: Secondary | ICD-10-CM | POA: Diagnosis not present

## 2017-10-20 DIAGNOSIS — Z139 Encounter for screening, unspecified: Secondary | ICD-10-CM | POA: Diagnosis not present

## 2017-10-20 DIAGNOSIS — Z1231 Encounter for screening mammogram for malignant neoplasm of breast: Secondary | ICD-10-CM | POA: Diagnosis not present

## 2017-10-20 DIAGNOSIS — Z1331 Encounter for screening for depression: Secondary | ICD-10-CM | POA: Diagnosis not present

## 2017-10-20 DIAGNOSIS — Z1339 Encounter for screening examination for other mental health and behavioral disorders: Secondary | ICD-10-CM | POA: Diagnosis not present

## 2017-10-20 DIAGNOSIS — E0822 Diabetes mellitus due to underlying condition with diabetic chronic kidney disease: Secondary | ICD-10-CM | POA: Diagnosis not present

## 2017-10-20 DIAGNOSIS — Z0000 Encounter for general adult medical examination without abnormal findings: Secondary | ICD-10-CM | POA: Diagnosis not present

## 2017-10-21 DIAGNOSIS — E0822 Diabetes mellitus due to underlying condition with diabetic chronic kidney disease: Secondary | ICD-10-CM | POA: Diagnosis not present

## 2017-10-22 DIAGNOSIS — E0822 Diabetes mellitus due to underlying condition with diabetic chronic kidney disease: Secondary | ICD-10-CM | POA: Diagnosis not present

## 2017-10-23 DIAGNOSIS — E0822 Diabetes mellitus due to underlying condition with diabetic chronic kidney disease: Secondary | ICD-10-CM | POA: Diagnosis not present

## 2017-10-24 DIAGNOSIS — E0822 Diabetes mellitus due to underlying condition with diabetic chronic kidney disease: Secondary | ICD-10-CM | POA: Diagnosis not present

## 2017-10-25 DIAGNOSIS — E0822 Diabetes mellitus due to underlying condition with diabetic chronic kidney disease: Secondary | ICD-10-CM | POA: Diagnosis not present

## 2017-10-26 DIAGNOSIS — E0822 Diabetes mellitus due to underlying condition with diabetic chronic kidney disease: Secondary | ICD-10-CM | POA: Diagnosis not present

## 2017-10-27 DIAGNOSIS — E0822 Diabetes mellitus due to underlying condition with diabetic chronic kidney disease: Secondary | ICD-10-CM | POA: Diagnosis not present

## 2017-10-27 DIAGNOSIS — M545 Low back pain: Secondary | ICD-10-CM | POA: Diagnosis not present

## 2017-10-27 DIAGNOSIS — R262 Difficulty in walking, not elsewhere classified: Secondary | ICD-10-CM | POA: Diagnosis not present

## 2017-10-27 DIAGNOSIS — M546 Pain in thoracic spine: Secondary | ICD-10-CM | POA: Diagnosis not present

## 2017-10-28 DIAGNOSIS — E0822 Diabetes mellitus due to underlying condition with diabetic chronic kidney disease: Secondary | ICD-10-CM | POA: Diagnosis not present

## 2017-10-28 DIAGNOSIS — M545 Low back pain: Secondary | ICD-10-CM | POA: Diagnosis not present

## 2017-10-29 DIAGNOSIS — E0822 Diabetes mellitus due to underlying condition with diabetic chronic kidney disease: Secondary | ICD-10-CM | POA: Diagnosis not present

## 2017-10-30 DIAGNOSIS — E0822 Diabetes mellitus due to underlying condition with diabetic chronic kidney disease: Secondary | ICD-10-CM | POA: Diagnosis not present

## 2017-10-31 DIAGNOSIS — R262 Difficulty in walking, not elsewhere classified: Secondary | ICD-10-CM | POA: Diagnosis not present

## 2017-10-31 DIAGNOSIS — G4733 Obstructive sleep apnea (adult) (pediatric): Secondary | ICD-10-CM | POA: Diagnosis not present

## 2017-10-31 DIAGNOSIS — E0822 Diabetes mellitus due to underlying condition with diabetic chronic kidney disease: Secondary | ICD-10-CM | POA: Diagnosis not present

## 2017-10-31 DIAGNOSIS — M546 Pain in thoracic spine: Secondary | ICD-10-CM | POA: Diagnosis not present

## 2017-10-31 DIAGNOSIS — M545 Low back pain: Secondary | ICD-10-CM | POA: Diagnosis not present

## 2017-10-31 DIAGNOSIS — Z1231 Encounter for screening mammogram for malignant neoplasm of breast: Secondary | ICD-10-CM | POA: Diagnosis not present

## 2017-11-01 DIAGNOSIS — R262 Difficulty in walking, not elsewhere classified: Secondary | ICD-10-CM | POA: Diagnosis not present

## 2017-11-01 DIAGNOSIS — M545 Low back pain: Secondary | ICD-10-CM | POA: Diagnosis not present

## 2017-11-01 DIAGNOSIS — E0822 Diabetes mellitus due to underlying condition with diabetic chronic kidney disease: Secondary | ICD-10-CM | POA: Diagnosis not present

## 2017-11-01 DIAGNOSIS — M546 Pain in thoracic spine: Secondary | ICD-10-CM | POA: Diagnosis not present

## 2017-11-02 DIAGNOSIS — E0822 Diabetes mellitus due to underlying condition with diabetic chronic kidney disease: Secondary | ICD-10-CM | POA: Diagnosis not present

## 2017-11-03 DIAGNOSIS — E0822 Diabetes mellitus due to underlying condition with diabetic chronic kidney disease: Secondary | ICD-10-CM | POA: Diagnosis not present

## 2017-11-04 DIAGNOSIS — E0822 Diabetes mellitus due to underlying condition with diabetic chronic kidney disease: Secondary | ICD-10-CM | POA: Diagnosis not present

## 2017-11-05 DIAGNOSIS — E0822 Diabetes mellitus due to underlying condition with diabetic chronic kidney disease: Secondary | ICD-10-CM | POA: Diagnosis not present

## 2017-11-06 DIAGNOSIS — E0822 Diabetes mellitus due to underlying condition with diabetic chronic kidney disease: Secondary | ICD-10-CM | POA: Diagnosis not present

## 2017-11-07 DIAGNOSIS — R262 Difficulty in walking, not elsewhere classified: Secondary | ICD-10-CM | POA: Diagnosis not present

## 2017-11-07 DIAGNOSIS — M546 Pain in thoracic spine: Secondary | ICD-10-CM | POA: Diagnosis not present

## 2017-11-07 DIAGNOSIS — E0822 Diabetes mellitus due to underlying condition with diabetic chronic kidney disease: Secondary | ICD-10-CM | POA: Diagnosis not present

## 2017-11-07 DIAGNOSIS — M545 Low back pain: Secondary | ICD-10-CM | POA: Diagnosis not present

## 2017-11-08 DIAGNOSIS — E0822 Diabetes mellitus due to underlying condition with diabetic chronic kidney disease: Secondary | ICD-10-CM | POA: Diagnosis not present

## 2017-11-09 DIAGNOSIS — E0822 Diabetes mellitus due to underlying condition with diabetic chronic kidney disease: Secondary | ICD-10-CM | POA: Diagnosis not present

## 2017-11-10 DIAGNOSIS — E0822 Diabetes mellitus due to underlying condition with diabetic chronic kidney disease: Secondary | ICD-10-CM | POA: Diagnosis not present

## 2017-11-11 DIAGNOSIS — E0822 Diabetes mellitus due to underlying condition with diabetic chronic kidney disease: Secondary | ICD-10-CM | POA: Diagnosis not present

## 2017-11-12 DIAGNOSIS — E0822 Diabetes mellitus due to underlying condition with diabetic chronic kidney disease: Secondary | ICD-10-CM | POA: Diagnosis not present

## 2017-11-13 DIAGNOSIS — E0822 Diabetes mellitus due to underlying condition with diabetic chronic kidney disease: Secondary | ICD-10-CM | POA: Diagnosis not present

## 2017-11-14 DIAGNOSIS — E0822 Diabetes mellitus due to underlying condition with diabetic chronic kidney disease: Secondary | ICD-10-CM | POA: Diagnosis not present

## 2017-11-14 DIAGNOSIS — R262 Difficulty in walking, not elsewhere classified: Secondary | ICD-10-CM | POA: Diagnosis not present

## 2017-11-14 DIAGNOSIS — M546 Pain in thoracic spine: Secondary | ICD-10-CM | POA: Diagnosis not present

## 2017-11-14 DIAGNOSIS — M545 Low back pain: Secondary | ICD-10-CM | POA: Diagnosis not present

## 2017-11-15 DIAGNOSIS — E0822 Diabetes mellitus due to underlying condition with diabetic chronic kidney disease: Secondary | ICD-10-CM | POA: Diagnosis not present

## 2017-11-16 DIAGNOSIS — M546 Pain in thoracic spine: Secondary | ICD-10-CM | POA: Diagnosis not present

## 2017-11-16 DIAGNOSIS — E0822 Diabetes mellitus due to underlying condition with diabetic chronic kidney disease: Secondary | ICD-10-CM | POA: Diagnosis not present

## 2017-11-16 DIAGNOSIS — M545 Low back pain: Secondary | ICD-10-CM | POA: Diagnosis not present

## 2017-11-16 DIAGNOSIS — R262 Difficulty in walking, not elsewhere classified: Secondary | ICD-10-CM | POA: Diagnosis not present

## 2017-11-17 DIAGNOSIS — E0822 Diabetes mellitus due to underlying condition with diabetic chronic kidney disease: Secondary | ICD-10-CM | POA: Diagnosis not present

## 2017-11-18 DIAGNOSIS — E0822 Diabetes mellitus due to underlying condition with diabetic chronic kidney disease: Secondary | ICD-10-CM | POA: Diagnosis not present

## 2017-11-19 DIAGNOSIS — E0822 Diabetes mellitus due to underlying condition with diabetic chronic kidney disease: Secondary | ICD-10-CM | POA: Diagnosis not present

## 2017-11-20 DIAGNOSIS — E0822 Diabetes mellitus due to underlying condition with diabetic chronic kidney disease: Secondary | ICD-10-CM | POA: Diagnosis not present

## 2017-11-21 ENCOUNTER — Encounter: Payer: Self-pay | Admitting: Gastroenterology

## 2017-11-21 ENCOUNTER — Ambulatory Visit: Payer: Medicare HMO | Admitting: Gastroenterology

## 2017-11-21 DIAGNOSIS — E0822 Diabetes mellitus due to underlying condition with diabetic chronic kidney disease: Secondary | ICD-10-CM | POA: Diagnosis not present

## 2017-11-22 DIAGNOSIS — E0822 Diabetes mellitus due to underlying condition with diabetic chronic kidney disease: Secondary | ICD-10-CM | POA: Diagnosis not present

## 2017-11-23 DIAGNOSIS — E0822 Diabetes mellitus due to underlying condition with diabetic chronic kidney disease: Secondary | ICD-10-CM | POA: Diagnosis not present

## 2017-11-24 DIAGNOSIS — M545 Low back pain: Secondary | ICD-10-CM | POA: Diagnosis not present

## 2017-11-24 DIAGNOSIS — M546 Pain in thoracic spine: Secondary | ICD-10-CM | POA: Diagnosis not present

## 2017-11-24 DIAGNOSIS — R262 Difficulty in walking, not elsewhere classified: Secondary | ICD-10-CM | POA: Diagnosis not present

## 2017-11-24 DIAGNOSIS — E0822 Diabetes mellitus due to underlying condition with diabetic chronic kidney disease: Secondary | ICD-10-CM | POA: Diagnosis not present

## 2017-11-25 DIAGNOSIS — E0822 Diabetes mellitus due to underlying condition with diabetic chronic kidney disease: Secondary | ICD-10-CM | POA: Diagnosis not present

## 2017-11-26 DIAGNOSIS — E0822 Diabetes mellitus due to underlying condition with diabetic chronic kidney disease: Secondary | ICD-10-CM | POA: Diagnosis not present

## 2017-11-27 DIAGNOSIS — Z87828 Personal history of other (healed) physical injury and trauma: Secondary | ICD-10-CM | POA: Diagnosis not present

## 2017-11-27 DIAGNOSIS — Z6827 Body mass index (BMI) 27.0-27.9, adult: Secondary | ICD-10-CM | POA: Diagnosis not present

## 2017-11-27 DIAGNOSIS — I10 Essential (primary) hypertension: Secondary | ICD-10-CM | POA: Diagnosis not present

## 2017-11-27 DIAGNOSIS — M79605 Pain in left leg: Secondary | ICD-10-CM | POA: Diagnosis not present

## 2017-11-27 DIAGNOSIS — M545 Low back pain: Secondary | ICD-10-CM | POA: Diagnosis not present

## 2017-11-27 DIAGNOSIS — E669 Obesity, unspecified: Secondary | ICD-10-CM | POA: Diagnosis not present

## 2017-11-27 DIAGNOSIS — S3992XA Unspecified injury of lower back, initial encounter: Secondary | ICD-10-CM | POA: Diagnosis not present

## 2017-11-27 DIAGNOSIS — E0822 Diabetes mellitus due to underlying condition with diabetic chronic kidney disease: Secondary | ICD-10-CM | POA: Diagnosis not present

## 2017-11-27 DIAGNOSIS — M533 Sacrococcygeal disorders, not elsewhere classified: Secondary | ICD-10-CM | POA: Diagnosis not present

## 2017-11-28 DIAGNOSIS — E0822 Diabetes mellitus due to underlying condition with diabetic chronic kidney disease: Secondary | ICD-10-CM | POA: Diagnosis not present

## 2017-11-29 DIAGNOSIS — E0822 Diabetes mellitus due to underlying condition with diabetic chronic kidney disease: Secondary | ICD-10-CM | POA: Diagnosis not present

## 2017-11-30 DIAGNOSIS — G4733 Obstructive sleep apnea (adult) (pediatric): Secondary | ICD-10-CM | POA: Diagnosis not present

## 2017-11-30 DIAGNOSIS — E0822 Diabetes mellitus due to underlying condition with diabetic chronic kidney disease: Secondary | ICD-10-CM | POA: Diagnosis not present

## 2017-12-01 DIAGNOSIS — E0822 Diabetes mellitus due to underlying condition with diabetic chronic kidney disease: Secondary | ICD-10-CM | POA: Diagnosis not present

## 2017-12-02 DIAGNOSIS — E0822 Diabetes mellitus due to underlying condition with diabetic chronic kidney disease: Secondary | ICD-10-CM | POA: Diagnosis not present

## 2017-12-03 DIAGNOSIS — E0822 Diabetes mellitus due to underlying condition with diabetic chronic kidney disease: Secondary | ICD-10-CM | POA: Diagnosis not present

## 2017-12-04 DIAGNOSIS — E0822 Diabetes mellitus due to underlying condition with diabetic chronic kidney disease: Secondary | ICD-10-CM | POA: Diagnosis not present

## 2017-12-05 DIAGNOSIS — E0822 Diabetes mellitus due to underlying condition with diabetic chronic kidney disease: Secondary | ICD-10-CM | POA: Diagnosis not present

## 2017-12-06 DIAGNOSIS — E669 Obesity, unspecified: Secondary | ICD-10-CM | POA: Diagnosis not present

## 2017-12-06 DIAGNOSIS — M1990 Unspecified osteoarthritis, unspecified site: Secondary | ICD-10-CM | POA: Diagnosis not present

## 2017-12-06 DIAGNOSIS — E559 Vitamin D deficiency, unspecified: Secondary | ICD-10-CM | POA: Diagnosis not present

## 2017-12-06 DIAGNOSIS — J309 Allergic rhinitis, unspecified: Secondary | ICD-10-CM | POA: Diagnosis not present

## 2017-12-06 DIAGNOSIS — I10 Essential (primary) hypertension: Secondary | ICD-10-CM | POA: Diagnosis not present

## 2017-12-06 DIAGNOSIS — E785 Hyperlipidemia, unspecified: Secondary | ICD-10-CM | POA: Diagnosis not present

## 2017-12-06 DIAGNOSIS — R262 Difficulty in walking, not elsewhere classified: Secondary | ICD-10-CM | POA: Diagnosis not present

## 2017-12-06 DIAGNOSIS — E0822 Diabetes mellitus due to underlying condition with diabetic chronic kidney disease: Secondary | ICD-10-CM | POA: Diagnosis not present

## 2017-12-06 DIAGNOSIS — F419 Anxiety disorder, unspecified: Secondary | ICD-10-CM | POA: Diagnosis not present

## 2017-12-06 DIAGNOSIS — E119 Type 2 diabetes mellitus without complications: Secondary | ICD-10-CM | POA: Diagnosis not present

## 2017-12-06 DIAGNOSIS — M545 Low back pain: Secondary | ICD-10-CM | POA: Diagnosis not present

## 2017-12-06 DIAGNOSIS — Z79899 Other long term (current) drug therapy: Secondary | ICD-10-CM | POA: Diagnosis not present

## 2017-12-06 DIAGNOSIS — Z6827 Body mass index (BMI) 27.0-27.9, adult: Secondary | ICD-10-CM | POA: Diagnosis not present

## 2017-12-07 DIAGNOSIS — E0822 Diabetes mellitus due to underlying condition with diabetic chronic kidney disease: Secondary | ICD-10-CM | POA: Diagnosis not present

## 2017-12-08 DIAGNOSIS — M545 Low back pain: Secondary | ICD-10-CM | POA: Diagnosis not present

## 2017-12-08 DIAGNOSIS — R262 Difficulty in walking, not elsewhere classified: Secondary | ICD-10-CM | POA: Diagnosis not present

## 2017-12-08 DIAGNOSIS — E0822 Diabetes mellitus due to underlying condition with diabetic chronic kidney disease: Secondary | ICD-10-CM | POA: Diagnosis not present

## 2017-12-09 DIAGNOSIS — E0822 Diabetes mellitus due to underlying condition with diabetic chronic kidney disease: Secondary | ICD-10-CM | POA: Diagnosis not present

## 2017-12-10 DIAGNOSIS — L919 Hypertrophic disorder of the skin, unspecified: Secondary | ICD-10-CM | POA: Diagnosis not present

## 2017-12-10 DIAGNOSIS — E0822 Diabetes mellitus due to underlying condition with diabetic chronic kidney disease: Secondary | ICD-10-CM | POA: Diagnosis not present

## 2017-12-11 DIAGNOSIS — E0822 Diabetes mellitus due to underlying condition with diabetic chronic kidney disease: Secondary | ICD-10-CM | POA: Diagnosis not present

## 2017-12-12 ENCOUNTER — Ambulatory Visit (INDEPENDENT_AMBULATORY_CARE_PROVIDER_SITE_OTHER): Payer: Medicare HMO | Admitting: Pulmonary Disease

## 2017-12-12 ENCOUNTER — Telehealth: Payer: Self-pay | Admitting: Pulmonary Disease

## 2017-12-12 ENCOUNTER — Encounter: Payer: Self-pay | Admitting: Pulmonary Disease

## 2017-12-12 VITALS — BP 124/68 | HR 85 | Ht 65.5 in | Wt 168.0 lb

## 2017-12-12 DIAGNOSIS — R0602 Shortness of breath: Secondary | ICD-10-CM

## 2017-12-12 DIAGNOSIS — J4530 Mild persistent asthma, uncomplicated: Secondary | ICD-10-CM | POA: Diagnosis not present

## 2017-12-12 DIAGNOSIS — E0822 Diabetes mellitus due to underlying condition with diabetic chronic kidney disease: Secondary | ICD-10-CM | POA: Diagnosis not present

## 2017-12-12 MED ORDER — BUDESONIDE-FORMOTEROL FUMARATE 80-4.5 MCG/ACT IN AERO: 2.0000 | INHALATION_SPRAY | Freq: Two times a day (BID) | RESPIRATORY_TRACT | 0 refills | Status: DC

## 2017-12-12 NOTE — Telephone Encounter (Signed)
160 is fine, 2 puff bid

## 2017-12-12 NOTE — Progress Notes (Signed)
Subjective:   PATIENT ID: Deborah Hutchinson GENDER: female DOB: 10/22/1945, MRN: [id number]  Synopsis: Referred to see Dr. Elsworth Hutchinson in 2018 for dyspnea, possible Asthma/COPD though she has normal pulmonary function testing.  Smoked 2 ppd for 50 years  HPI  Chief Complaint  Patient presents with  . Follow-up    increased SOB   Deborah Hutchinson continues to complain of shortness of breath.  She says that in general her dyspnea is perhaps a bit better than when she first started seeing [place] but she continues to have episodes of a headache associated with shortness of breath.  She wonders if whether or not her oxygen level is low when this happens.  She says that she is taking "her inhalers" but when asked for details on this she is not able to provide much.  Specifically she is not sure if she has rescue inhalers or if she is taking Symbicort or Spiriva.  She denies chest pain.  She says that she is supposed to go back and see cardiology in about a week.  Past Medical History:  Diagnosis Date  . Angina    normal stress test  . Bronchitis   . 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 Diabetes mellitus   . Diabetes mellitus due to underlying condition with unspecified complications ([place]) 12/07/2749  . Dyslipidemia 06/10/2015  . Essential hypertension 06/10/2015  . GERD (gastroesophageal reflux disease)   . H/O hiatal hernia   . Heart murmur   . Hyperlipemia   . Hypertension   . Nonspecific (abnormal) findings on radiological and other examination of gastrointestinal tract 05/19/2011  . Pain and swelling of toe of right foot 07/15/2016  . Palpitations 06/02/2016  . Shortness of breath    on  excertion      Review of Systems  Constitutional: Positive for malaise/fatigue. Negative for chills.  HENT: Positive for congestion. Negative for ear discharge and sore throat.   Respiratory: Positive for shortness of breath. Negative for cough and  wheezing.   Cardiovascular: Negative for chest pain, claudication and leg swelling.  Neurological: Negative for weakness.      Objective:  Physical Exam   Vitals:   12/12/17 1009  BP: 124/68  Pulse: 85  SpO2: 96%  Weight: 168 lb (76.2 kg)  Height: 5' 5.5" (1.664 m)  RA  General:  Resting comfortably in bed HENT: NCAT OP clear PULM: CTA B, normal  CV: RRR, no mgr GI: BS+, soft, nontender MSK: normal bulk and tone Neuro: awake, alert, no distress, MAEW   CBC    Component Value Date/Time   WBC 7.9 09/18/2017 0000   RBC 4.27 09/18/2017 0000   HGB 12.2 09/18/2017 0000   HCT 38.0 09/18/2017 0000   PLT 430 (H) 09/18/2017 0000   MCV 89 09/18/2017 0000   MCH 28.6 09/18/2017 0000   MCHC 32.1 09/18/2017 0000   RDW 14.3 09/18/2017 0000   LYMPHSABS 2.5 09/18/2017 0000   EOSABS 0.2 09/18/2017 0000   BASOSABS 0.0 09/18/2017 0000     Chest imaging: 11/2016 CT angiogram chest: stable 47mm nodule (compared to 05/2016), no PE images reviewed, no pulmonary parenchymal abnormality seen. 09/2017 HRCT> air trapping, adrenal nodule, no other pulmonary pathology, images personally reviewed   PFT: April 2018 ratio 90%, FVC 2.22 L 87% predicted, total lung capacity 5.26 L 115% predicted, DLCO 14.18 mL 64% predicted December 2018 cardiopulmonary exercise test spirometry ratio 70%, FEV1 1.81 L  76% predicted, FEV1 post 1.64 L, 9% change: VO2 max 12.1 mL/kg/min, 83% predicted, heart rate maximum exercise 101, ventilatory max at exercise approximately 50% of maximum with exercise ventilatory capacity  O2 saturation 99%  Labs:  Path:  Echo: November 2018 transthoracic echocardiogram LVEF 60-65%, RV normal, PA pressure estimate normal  Heart Catheterization:  Record review: Records from in April 2018 visit with [facility] cardiology reviewed where she was felt to have coronary artery disease that did not need intervention.      Assessment & Plan:   Mild persistent asthma without  complication  Shortness of breath  Discussion: Strathman continues to complain of shortness of breath which is predominantly episodic.  Objectively she is been through a huge battery of tests to assess for lung disease and none of which have shown any severe abnormality.  We know that she has some degree of diffusion abnormality with a normal hemoglobin and small airways obstruction.  It appears that she has mild persistent asthma, complicated by a poor understanding of how to take her controller medicines and likely medication noncompliance.  That all being said, there is no clear evidence of severe underlying pathology of her lungs so given the episodic nature of her shortness of breath I think she may need to have an event monitor to look for a cardiac rhythm abnormality.  Plan: Mild persistent asthma: Take Symbicort 2 puffs twice a day no matter how you feel Stop Spiriva Use albuterol as needed for chest tightness wheezing or shortness of breath.  You can use 2 puffs every 4-6 hours as needed Get a flu shot in the fall Practice good hand hygiene Exercise regularly  Worsening shortness of breath: We will check another lung function test to see if there is any difference compared to the one done over a year ago, this can be done at [place] Because of the episodic nature of your shortness of breath I think you and your cardiologist may want to consider an event monitor.  When you come back on the next visit be sure to bring all of your medicines so that we can go over them with you  We will see you back in 4 weeks with a nurse practitioner here in this office.   Current Outpatient Medications:  .  albuterol (PROVENTIL HFA;VENTOLIN HFA) 108 (90 Base) MCG/ACT inhaler, Inhale 2 puffs into the lungs every 6 (six) hours as needed for wheezing or shortness of breath., Disp: 3 Inhaler, Rfl: 3 .  Albuterol (VENTOLIN IN), Inhale 2 puffs into the lungs every 6 (six) hours as needed (wheezing). ,  Disp: , Rfl:  .  amLODipine (NORVASC) 10 MG tablet, Take 10 mg by mouth daily., Disp: , Rfl:  .  amlodipine-atorvastatin (CADUET) 10-10 MG tablet, Take 1 tablet by mouth daily., Disp: , Rfl:  .  aspirin 81 MG tablet, Take 81 mg by mouth daily. , Disp: , Rfl:  .  BD PEN NEEDLE NANO U/F 32G X 4 MM MISC, , Disp: , Rfl:  .  buPROPion (WELLBUTRIN XL) 150 MG 24 hr tablet, Take 150 mg by mouth daily., Disp: , Rfl:  .  Cholecalciferol (VITAMIN D PO), Take 5,000 Units by mouth 2 (two) times daily. , Disp: , Rfl:  .  EPIPEN 2-PAK 0.3 MG/0.3ML SOAJ injection, , Disp: , Rfl:  .  escitalopram (LEXAPRO) 10 MG tablet, Take 10 mg by mouth daily., Disp: , Rfl:  .  fluticasone (FLONASE) 50 MCG/ACT nasal spray, , Disp: , Rfl:  .  fluticasone (VERAMYST) 27.5 MCG/SPRAY nasal spray, Place 2 sprays into the nose daily., Disp: , Rfl:  .  hydrALAZINE (APRESOLINE) 25 MG tablet, Take 37.5 mg by mouth 3 (three) times daily., Disp: , Rfl:  .  isosorbide mononitrate (IMDUR) 60 MG 24 hr tablet, Take 60 mg by mouth daily., Disp: , Rfl:  .  Lancets (ACCU-CHEK MULTICLIX) lancets, , Disp: , Rfl:  .  losartan-hydrochlorothiazide (HYZAAR) 100-25 MG tablet, Take 1 tablet by mouth daily., Disp: , Rfl:  .  metFORMIN (GLUCOPHAGE) 500 MG tablet, Take 500 mg by mouth 2 (two) times daily with a meal. , Disp: , Rfl:  .  methocarbamol (ROBAXIN) 500 MG tablet, Take 500 mg by mouth 2 (two) times daily., Disp: , Rfl:  .  montelukast (SINGULAIR) 10 MG tablet, Take 10 mg by mouth daily., Disp: , Rfl:  .  Multiple Vitamin (MULTIVITAMIN) capsule, Take 1 capsule by mouth daily.  , Disp: , Rfl:  .  nitroGLYCERIN (NITROSTAT) 0.4 MG SL tablet, Place 1 tablet under the tongue every 5 (five) minutes x 3 doses as needed for chest pain., Disp: , Rfl:  .  omeprazole (PRILOSEC) 20 MG capsule, Take 20 mg by mouth 2 (two) times daily before a meal. , Disp: , Rfl:  .  ranitidine (ZANTAC) 300 MG tablet, Take 1 tablet (300 mg total) by mouth 2 (two) times  daily., Disp: , Rfl:  .  simvastatin (ZOCOR) 20 MG tablet, Take 20 mg by mouth every evening.  , Disp: , Rfl:  .  traMADol (ULTRAM) 50 MG tablet, Take 50 mg by mouth 2 (two) times daily as needed for pain., Disp: , Rfl:  .  VICTOZA 18 MG/3ML SOPN, 12 mg daily. , Disp: , Rfl:

## 2017-12-12 NOTE — Telephone Encounter (Signed)
Spoke with pt, states that she has been taking Symbicort 160, but was given a sample of Symbicort 80 today.  Pt would like a 3 month supply of Symbicort sent to [facility] mail order pharmacy, but pt wants to verify what strength of Symbicort she is to be on.  BQ please advise.  Thanks!

## 2017-12-12 NOTE — Patient Instructions (Signed)
Mild persistent asthma: Take Symbicort 2 puffs twice a day no matter how you feel Stop Spiriva Use albuterol as needed for chest tightness wheezing or shortness of breath.  You can use 2 puffs every 4-6 hours as needed Get a flu shot in the fall Practice good hand hygiene Exercise regularly  Worsening shortness of breath: We will check another lung function test to see if there is any difference compared to the one done over a year ago, this can be done at [place] Because of the episodic nature of your shortness of breath I think you and your cardiologist may want to consider an event monitor.  When you come back on the next visit be sure to bring all of your medicines so that we can go over them with you  We will see you back in 4 weeks with a nurse practitioner here in this office.

## 2017-12-13 DIAGNOSIS — E0822 Diabetes mellitus due to underlying condition with diabetic chronic kidney disease: Secondary | ICD-10-CM | POA: Diagnosis not present

## 2017-12-13 MED ORDER — BUDESONIDE-FORMOTEROL FUMARATE 160-4.5 MCG/ACT IN AERO: 2.0000 | INHALATION_SPRAY | Freq: Two times a day (BID) | RESPIRATORY_TRACT | 3 refills | Status: DC

## 2017-12-13 NOTE — Telephone Encounter (Signed)
Spoke with the pt and notified of recs per Dr Lake Bells and she verbalized understanding. Rx was sent for the Symbicort 160

## 2017-12-14 DIAGNOSIS — E0822 Diabetes mellitus due to underlying condition with diabetic chronic kidney disease: Secondary | ICD-10-CM | POA: Diagnosis not present

## 2017-12-14 DIAGNOSIS — R262 Difficulty in walking, not elsewhere classified: Secondary | ICD-10-CM | POA: Diagnosis not present

## 2017-12-14 DIAGNOSIS — M545 Low back pain: Secondary | ICD-10-CM | POA: Diagnosis not present

## 2017-12-15 ENCOUNTER — Telehealth: Payer: Self-pay | Admitting: Cardiology

## 2017-12-15 ENCOUNTER — Ambulatory Visit (INDEPENDENT_AMBULATORY_CARE_PROVIDER_SITE_OTHER): Payer: Medicare HMO | Admitting: Cardiology

## 2017-12-15 ENCOUNTER — Other Ambulatory Visit: Payer: Self-pay

## 2017-12-15 ENCOUNTER — Encounter: Payer: Self-pay | Admitting: Cardiology

## 2017-12-15 VITALS — BP 124/76 | HR 74 | Ht 65.5 in | Wt 169.0 lb

## 2017-12-15 DIAGNOSIS — E785 Hyperlipidemia, unspecified: Secondary | ICD-10-CM | POA: Diagnosis not present

## 2017-12-15 DIAGNOSIS — I2510 Atherosclerotic heart disease of native coronary artery without angina pectoris: Secondary | ICD-10-CM | POA: Diagnosis not present

## 2017-12-15 DIAGNOSIS — E0822 Diabetes mellitus due to underlying condition with diabetic chronic kidney disease: Secondary | ICD-10-CM | POA: Diagnosis not present

## 2017-12-15 DIAGNOSIS — R0602 Shortness of breath: Secondary | ICD-10-CM

## 2017-12-15 DIAGNOSIS — E088 Diabetes mellitus due to underlying condition with unspecified complications: Secondary | ICD-10-CM | POA: Diagnosis not present

## 2017-12-15 DIAGNOSIS — J431 Panlobular emphysema: Secondary | ICD-10-CM | POA: Diagnosis not present

## 2017-12-15 DIAGNOSIS — I10 Essential (primary) hypertension: Secondary | ICD-10-CM

## 2017-12-15 LAB — BASIC METABOLIC PANEL
BUN/Creatinine Ratio: 13 (ref 12–28)
BUN: 9 mg/dL (ref 8–27)
CALCIUM: 10.1 mg/dL (ref 8.7–10.3)
CHLORIDE: 105 mmol/L (ref 96–106)
CO2: 24 mmol/L (ref 20–29)
Creatinine, Ser: 0.71 mg/dL (ref 0.57–1.00)
GFR calc Af Amer: 97 mL/min/{1.73_m2} (ref >59)
GFR, EST NON AFRICAN AMERICAN: 84 mL/min/{1.73_m2} (ref >59)
Glucose: 80 mg/dL (ref 65–99)
POTASSIUM: 4.4 mmol/L (ref 3.5–5.2)
Sodium: 145 mmol/L — ABNORMAL HIGH (ref 134–144)

## 2017-12-15 MED ORDER — NITROGLYCERIN 0.4 MG SL SUBL: 0.4000 mg | SUBLINGUAL_TABLET | SUBLINGUAL | 11 refills | Status: DC | PRN

## 2017-12-15 MED ORDER — METOPROLOL TARTRATE 50 MG PO TABS: 50.0000 mg | ORAL_TABLET | Freq: Once | ORAL | 0 refills | Status: DC

## 2017-12-15 NOTE — Patient Instructions (Addendum)
Medication Instructions:  Your physician recommends that you continue on your current medications as directed. Please refer to the Current Medication list given to you today.  Refill for nitro given.  Labwork: Your physician recommends that you have the following labs drawn: BMP today  Testing/Procedures: Please arrive at the [facility] main entrance of [facility] (you will be called with an appointment date and time) (30-45 minutes prior to test start time)  [facility] [place], [place] [address] [phone]  Proceed to the [facility] Radiology Department (First Floor).  Please follow these instructions carefully (unless otherwise directed):  Hold all erectile dysfunction medications at least 48 hours prior to test.  On the Night Before the Test: . Drink plenty of water. . Do not consume any caffeinated/decaffeinated beverages or chocolate 12 hours prior to your test. . Do not take any antihistamines 12 hours prior to your test. . If you take Metformin do not take 24 hours prior to test.  On the Day of the Test: . Drink plenty of water. Do not drink any water within one hour of the test. . Do not eat any food 4 hours prior to the test. . You may take your regular medications prior to the test. . IF NOT ON A BETA BLOCKER - Take 50 mg of lopressor (metoprolol) one hour before the test. . HOLD losartan-hydrochlorothiazide and victoza  After the Test: . Drink plenty of water. . After receiving IV contrast, you may experience a mild flushed feeling. This is normal. . On occasion, you may experience a mild rash up to 24 hours after the test. This is not dangerous. If this occurs, you can take Benadryl 25 mg and increase your fluid intake. . If you experience trouble breathing, this can be serious. If it is severe call 911 IMMEDIATELY. If it is mild, please call our office. . If you take any of these medications: Glipizide/Metformin,  Avandament, Glucavance, please do not take 48 hours after completing test.   Follow-Up: Your physician recommends that you schedule a follow-up appointment in: 6 months  Any Other Special Instructions Will Be Listed Below (If Applicable).     If you need a refill on your cardiac medications before your next appointment, please call your pharmacy.   [place], RN, BSN   Cardiac CT Angiogram A cardiac CT angiogram is a procedure to look at the heart and the area around the heart. It may be done to help find the cause of chest pains or other symptoms of heart disease. During this procedure, a large X-ray machine, called a CT scanner, takes detailed pictures of the heart and the surrounding area after a dye (contrast material) has been injected into blood vessels in the area. The procedure is also sometimes called a coronary CT angiogram, coronary artery scanning, or CTA. A cardiac CT angiogram allows the health care provider to see how well blood is flowing to and from the heart. The health care provider will be able to see if there are any problems, such as:  Blockage or narrowing of the coronary arteries in the heart.  Fluid around the heart.  Signs of weakness or disease in the muscles, valves, and tissues of the heart.  Tell a health care provider about:  Any allergies you have. This is especially important if you have had a previous allergic reaction to contrast dye.  All medicines you are taking, including vitamins, herbs, eye  drops, creams, and over-the-counter medicines.  Any blood disorders you have.  Any surgeries you have had.  Any medical conditions you have.  Whether you are pregnant or may be pregnant.  Any anxiety disorders, chronic pain, or other conditions you have that may increase your stress or prevent you from lying still. What are the risks? Generally, this is a safe procedure. However, problems may occur,  including:  Bleeding.  Infection.  Allergic reactions to medicines or dyes.  Damage to other structures or organs.  Kidney damage from the dye or contrast that is used.  Increased risk of cancer from radiation exposure. This risk is low. Talk with your health care provider about: ? The risks and benefits of testing. ? How you can receive the lowest dose of radiation.  What happens before the procedure?  Wear comfortable clothing and remove any jewelry, glasses, dentures, and hearing aids.  Follow instructions from your health care provider about eating and drinking. This may include: ? For 12 hours before the test - avoid caffeine. This includes tea, coffee, soda, energy drinks, and diet pills. Drink plenty of water or other fluids that do not have caffeine in them. Being well-hydrated can prevent complications. ? For 4-6 hours before the test - stop eating and drinking. The contrast dye can cause nausea, but this is less likely if your stomach is empty.  Ask your health care provider about changing or stopping your regular medicines. This is especially important if you are taking diabetes medicines, blood thinners, or medicines to treat erectile dysfunction. What happens during the procedure?  Hair on your chest may need to be removed so that small sticky patches called electrodes can be placed on your chest. These will transmit information that helps to monitor your heart during the test.  An IV tube will be inserted into one of your veins.  You might be given a medicine to control your heart rate during the test. This will help to ensure that good images are obtained.  You will be asked to lie on an exam table. This table will slide in and out of the CT machine during the procedure.  Contrast dye will be injected into the IV tube. You might feel warm, or you may get a metallic taste in your mouth.  You will be given a medicine (nitroglycerin) to relax (dilate) the arteries in  your heart.  The table that you are lying on will move into the CT machine tunnel for the scan.  The person running the machine will give you instructions while the scans are being done. You may be asked to: ? Keep your arms above your head. ? Hold your breath. ? Stay very still, even if the table is moving.  When the scanning is complete, you will be moved out of the machine.  The IV tube will be removed. The procedure may vary among health care providers and hospitals. What happens after the procedure?  You might feel warm, or you may get a metallic taste in your mouth from the contrast dye.  You may have a headache from the nitroglycerin.  After the procedure, drink water or other fluids to wash (flush) the contrast material out of your body.  Contact a health care provider if you have any symptoms of allergy to the contrast. These symptoms include: ? Shortness of breath. ? Rash or hives. ? A racing heartbeat.  Most people can return to their normal activities right after the procedure. Ask your  health care provider what activities are safe for you.  It is up to you to get the results of your procedure. Ask your health care provider, or the department that is doing the procedure, when your results will be ready. Summary  A cardiac CT angiogram is a procedure to look at the heart and the area around the heart. It may be done to help find the cause of chest pains or other symptoms of heart disease.  During this procedure, a large X-ray machine, called a CT scanner, takes detailed pictures of the heart and the surrounding area after a dye (contrast material) has been injected into blood vessels in the area.  Ask your health care provider about changing or stopping your regular medicines before the procedure. This is especially important if you are taking diabetes medicines, blood thinners, or medicines to treat erectile dysfunction.  After the procedure, drink water or other  fluids to wash (flush) the contrast material out of your body. This information is not intended to replace advice given to you by your health care provider. Make sure you discuss any questions you have with your health care provider. Document Released: 04/07/2008 Document Revised: 03/14/2016 Document Reviewed: 03/14/2016 Elsevier Interactive Patient Education  2017 [employer].

## 2017-12-15 NOTE — Progress Notes (Signed)
Cardiology Office Note:    Date:  12/15/2017   ID:  Deborah Hutchinson, DOB 30-Jun-1945, MRN [id number]  PCP:  [name], MD  Cardiologist:  [name], MD   Referring MD: [name], MD    ASSESSMENT:    1. SOB (shortness of breath) on exertion   2. Coronary artery disease involving native coronary artery of native heart without angina pectoris   3. Essential hypertension   4. Panlobular emphysema ([place])   5. Diabetes mellitus due to underlying condition with complication, without long-term current use of insulin ([place])   6. Dyslipidemia    PLAN:    In order of problems listed above:  1. Secondary prevention stressed with the patient.  Importance of compliance with diet and medications stressed and she vocalized understanding. 2. Her blood pressure is stable.  Diet was discussed for dyslipidemia and diabetes mellitus. 3. I discussed with her invasive and noninvasive evaluation.  Her shortness of breath is of concern to me.  I discussed conventional and CT coronary angiography and she prefers to go that CT coronary angiography out at this time.  This will help me assess her symptoms from a coronary standpoint.  If this is negative then she could proceed with more aggressive pulmonary evaluation and possible event monitor.  She knows to go to the nearest emergency room for any concerning symptoms. 4. Sublingual nitroglycerin prescription was sent, its protocol and 911 protocol explained and the patient vocalized understanding questions were answered to the patient's satisfaction 5. Patient will be seen in follow-up appointment in 6 months or earlier if the patient has any concerns   Medication Adjustments/Labs and Tests Ordered: Current medicines are reviewed at length with the patient today.  Concerns regarding medicines are outlined above.  No orders of the defined types were placed in this encounter.  No orders of the defined types were placed in this encounter.    No chief  complaint on file.    History of Present Illness:    Deborah Hutchinson is a 72 y.o. female patient is a pleasant 72 year old female.  She has past medical history of essential hypertension, diabetes mellitus and dyslipidemia.  She has history of COPD and sees a pulmonologist on a regular basis.  She has had coronary angiography and nonobstructive disease in 2017.  She complains of chest tightness and shortness of breath on exertion which is steadily getting worse.  No orthopnea or PND.  At the time of my evaluation, the patient is alert awake oriented and in no distress.  She saw pulmonologist recently and he has recommended pulmonary function test retesting and possibly event monitor.  Past Medical History:  Diagnosis Date  . Angina    normal stress test  . Bronchitis   . 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 Diabetes mellitus   . Diabetes mellitus due to underlying condition with unspecified complications ([place]) 01/09/7341  . Dyslipidemia 06/10/2015  . Essential hypertension 06/10/2015  . GERD (gastroesophageal reflux disease)   . H/O hiatal hernia   . Heart murmur   . Hyperlipemia   . Hypertension   . Nonspecific (abnormal) findings on radiological and other examination of gastrointestinal tract 05/19/2011  . Pain and swelling of toe of right foot 07/15/2016  . Palpitations 06/02/2016  . Shortness of breath    on  excertion    Past Surgical History:  Procedure Laterality Date  . ABDOMINAL HYSTERECTOMY    .  ABDOMINAL HYSTERECTOMY    . BREAST SURGERY     begin mass,left  breast  . DILATION AND CURETTAGE OF UTERUS     one  . EUS  05/19/2011   Procedure: UPPER ENDOSCOPIC ULTRASOUND (EUS) LINEAR;  Surgeon: Owens Loffler, MD;  Location: [facility] ENDOSCOPY;  Service: Endoscopy;  Laterality: N/A;  radial linear   . HYSTEROTOMY    . KNEE SURGERY    . LEFT HEART CATH AND CORONARY ANGIOGRAPHY    . right thumb     tendon repaire  .  SHOULDER SURGERY     rotator cuff repair  . WRIST SURGERY     rigth    Current Medications: Current Meds  Medication Sig  . albuterol (PROVENTIL HFA;VENTOLIN HFA) 108 (90 Base) MCG/ACT inhaler Inhale 2 puffs into the lungs every 6 (six) hours as needed for wheezing or shortness of breath.  . Albuterol (VENTOLIN IN) Inhale 2 puffs into the lungs every 6 (six) hours as needed (wheezing).   Marland Kitchen amLODipine (NORVASC) 10 MG tablet Take 10 mg by mouth daily.  Marland Kitchen aspirin 81 MG tablet Take 81 mg by mouth daily.   . BD PEN NEEDLE NANO U/F 32G X 4 MM MISC   . budesonide-formoterol (SYMBICORT) 160-4.5 MCG/ACT inhaler Inhale 2 puffs into the lungs 2 (two) times daily.  Marland Kitchen buPROPion (WELLBUTRIN XL) 150 MG 24 hr tablet Take 150 mg by mouth daily.  . Cholecalciferol (VITAMIN D PO) Take 5,000 Units by mouth 2 (two) times daily.   Marland Kitchen EPIPEN 2-PAK 0.3 MG/0.3ML SOAJ injection   . escitalopram (LEXAPRO) 10 MG tablet Take 10 mg by mouth daily.  . hydrALAZINE (APRESOLINE) 25 MG tablet Take 37.5 mg by mouth 3 (three) times daily.  . isosorbide mononitrate (IMDUR) 60 MG 24 hr tablet Take 60 mg by mouth daily.  . Lancets (ACCU-CHEK MULTICLIX) lancets   . losartan-hydrochlorothiazide (HYZAAR) 100-25 MG tablet Take 1 tablet by mouth daily.  . metFORMIN (GLUCOPHAGE) 500 MG tablet Take 500 mg by mouth 2 (two) times daily with a meal.   . montelukast (SINGULAIR) 10 MG tablet Take 10 mg by mouth daily.  . Multiple Vitamin (MULTIVITAMIN) capsule Take 1 capsule by mouth daily.    . nitroGLYCERIN (NITROSTAT) 0.4 MG SL tablet Place 1 tablet under the tongue every 5 (five) minutes x 3 doses as needed for chest pain.  Marland Kitchen omeprazole (PRILOSEC) 20 MG capsule Take 20 mg by mouth 2 (two) times daily before a meal.   . ranitidine (ZANTAC) 300 MG tablet Take 1 tablet (300 mg total) by mouth 2 (two) times daily.  . simvastatin (ZOCOR) 20 MG tablet Take 20 mg by mouth every evening.    . traMADol (ULTRAM) 50 MG tablet Take 50 mg by mouth  2 (two) times daily as needed for pain.  Marland Kitchen VICTOZA 18 MG/3ML SOPN 12 mg daily.      Allergies:   Penicillins   Social History   Socioeconomic History  . Marital status: Single    Spouse name: Not on file  . Number of children: Not on file  . Years of education: Not on file  . Highest education level: Not on file  Occupational History  . Not on file  Social Needs  . Financial resource strain: Not on file  . Food insecurity:    Worry: Not on file    Inability: Not on file  . Transportation needs:    Medical: Not on file    Non-medical: Not on file  Tobacco Use  . Smoking status: Former Smoker    Packs/day: 1.00    Last attempt to quit: 05/17/2011    Years since quitting: 6.5  . Smokeless tobacco: Never Used  Substance and Sexual Activity  . Alcohol use: No  . Drug use: No  . Sexual activity: Never  Lifestyle  . Physical activity:    Days per week: Not on file    Minutes per session: Not on file  . Stress: Not on file  Relationships  . Social connections:    Talks on phone: Not on file    Gets together: Not on file    Attends religious service: Not on file    Active member of club or organization: Not on file    Attends meetings of clubs or organizations: Not on file    Relationship status: Not on file  Other Topics Concern  . Not on file  Social History Narrative  . Not on file     Family History: The patient's family history includes Diabetes in her sister; Hypertension in her daughter.  ROS:   Please see the history of present illness.    All other systems reviewed and are negative.  EKGs/Labs/Other Studies Reviewed:    The following studies were reviewed today: I discussed my findings with the patient at extensive length.  EKG reveals sinus rhythm with nonspecific ST-T changes.   Recent Labs: 09/18/2017: Hemoglobin 12.2; Platelets 430  Recent Lipid Panel No results found for: CHOL, TRIG, HDL, CHOLHDL, VLDL, LDLCALC, LDLDIRECT  Physical Exam:     VS:  BP 124/76 (BP Location: Right Arm, Patient Position: Sitting, Cuff Size: Normal)   Pulse 74   Ht 5' 5.5" (1.664 m)   Wt 169 lb (76.7 kg)   SpO2 99%   BMI 27.70 kg/m     Wt Readings from Last 3 Encounters:  12/15/17 169 lb (76.7 kg)  12/12/17 168 lb (76.2 kg)  10/05/17 168 lb (76.2 kg)     GEN: Patient is in no acute distress HEENT: Normal NECK: No JVD; No carotid bruits LYMPHATICS: No lymphadenopathy CARDIAC: Hear sounds regular, 2/6 systolic murmur at the apex. RESPIRATORY:  Clear to auscultation without rales, wheezing or rhonchi  ABDOMEN: Soft, non-tender, non-distended MUSCULOSKELETAL:  No edema; No deformity  SKIN: Warm and dry NEUROLOGIC:  Alert and oriented x 3 PSYCHIATRIC:  Normal affect   Signed, [name], MD  12/15/2017 11:02 AM    [place]

## 2017-12-15 NOTE — Telephone Encounter (Signed)
Pt states that she is claustrophobic and does not want to have the CT scan-it has been scheduled for 09/03-please call pt and make recomendations

## 2017-12-15 NOTE — Telephone Encounter (Signed)
Will give patient Xanax 0.25 mg x2 in order to complete the test- okay per Revankar.

## 2017-12-16 DIAGNOSIS — E0822 Diabetes mellitus due to underlying condition with diabetic chronic kidney disease: Secondary | ICD-10-CM | POA: Diagnosis not present

## 2017-12-17 DIAGNOSIS — E0822 Diabetes mellitus due to underlying condition with diabetic chronic kidney disease: Secondary | ICD-10-CM | POA: Diagnosis not present

## 2017-12-18 DIAGNOSIS — E0822 Diabetes mellitus due to underlying condition with diabetic chronic kidney disease: Secondary | ICD-10-CM | POA: Diagnosis not present

## 2017-12-19 DIAGNOSIS — E0822 Diabetes mellitus due to underlying condition with diabetic chronic kidney disease: Secondary | ICD-10-CM | POA: Diagnosis not present

## 2017-12-20 ENCOUNTER — Encounter: Payer: Self-pay | Admitting: Pulmonary Disease

## 2017-12-20 DIAGNOSIS — E0822 Diabetes mellitus due to underlying condition with diabetic chronic kidney disease: Secondary | ICD-10-CM | POA: Diagnosis not present

## 2017-12-21 DIAGNOSIS — E0822 Diabetes mellitus due to underlying condition with diabetic chronic kidney disease: Secondary | ICD-10-CM | POA: Diagnosis not present

## 2017-12-21 DIAGNOSIS — J4530 Mild persistent asthma, uncomplicated: Secondary | ICD-10-CM | POA: Diagnosis not present

## 2017-12-22 DIAGNOSIS — E0822 Diabetes mellitus due to underlying condition with diabetic chronic kidney disease: Secondary | ICD-10-CM | POA: Diagnosis not present

## 2017-12-23 DIAGNOSIS — E0822 Diabetes mellitus due to underlying condition with diabetic chronic kidney disease: Secondary | ICD-10-CM | POA: Diagnosis not present

## 2017-12-24 DIAGNOSIS — E0822 Diabetes mellitus due to underlying condition with diabetic chronic kidney disease: Secondary | ICD-10-CM | POA: Diagnosis not present

## 2017-12-25 ENCOUNTER — Other Ambulatory Visit: Payer: Self-pay

## 2017-12-25 DIAGNOSIS — E0822 Diabetes mellitus due to underlying condition with diabetic chronic kidney disease: Secondary | ICD-10-CM | POA: Diagnosis not present

## 2017-12-25 MED ORDER — ALPRAZOLAM 0.25 MG PO TABS: 0.2500 mg | ORAL_TABLET | Freq: Once | ORAL | 0 refills | Status: AC

## 2017-12-25 NOTE — Telephone Encounter (Signed)
Thank you :)

## 2017-12-25 NOTE — Telephone Encounter (Signed)
Order has been placed and faxed to [facility] for signing.

## 2017-12-26 ENCOUNTER — Encounter: Payer: Self-pay | Admitting: *Deleted

## 2017-12-26 ENCOUNTER — Other Ambulatory Visit: Payer: Self-pay | Admitting: *Deleted

## 2017-12-26 DIAGNOSIS — E0822 Diabetes mellitus due to underlying condition with diabetic chronic kidney disease: Secondary | ICD-10-CM | POA: Diagnosis not present

## 2017-12-26 MED ORDER — ALPRAZOLAM 0.5 MG PO TABS: 0.5000 mg | ORAL_TABLET | ORAL | 0 refills | Status: DC | PRN

## 2017-12-26 MED ORDER — ALPRAZOLAM 0.25 MG PO TABS: 0.5000 mg | ORAL_TABLET | Freq: Once | ORAL | 5 refills | Status: DC

## 2017-12-26 NOTE — Telephone Encounter (Signed)
No answer on patient's cell phone. Left message to return call on home phone. Xanax 0.5 mg x 5 tablets has been signed and faxed to the [facility] office for patient to pick up per Dr. Geraldo Pitter. Caryl Pina, RN in [place] made aware.

## 2017-12-27 ENCOUNTER — Telehealth: Payer: Self-pay | Admitting: Cardiology

## 2017-12-27 DIAGNOSIS — E0822 Diabetes mellitus due to underlying condition with diabetic chronic kidney disease: Secondary | ICD-10-CM | POA: Diagnosis not present

## 2017-12-27 NOTE — Telephone Encounter (Signed)
Returning your call. °

## 2017-12-27 NOTE — Telephone Encounter (Signed)
Left message on patient's cell phone and home phone to return call.  

## 2017-12-27 NOTE — Telephone Encounter (Signed)
Patient informed that xanax prescription is ready for pick up in [place]. Patient verbalized understanding and states that she will have her daughter pick it up. No further questions.

## 2017-12-27 NOTE — Telephone Encounter (Signed)
See previous encounter

## 2017-12-27 NOTE — Telephone Encounter (Signed)
Advised patient to take one xanax 0.5 mg tablet before leaving her house for testing and see how she feels. If she still feels very anxious upon arrival to testing location, she can take an additional tablet at that time per Dr. Geraldo Pitter. Patient verbalized understanding.

## 2017-12-28 ENCOUNTER — Ambulatory Visit: Payer: Medicare HMO | Admitting: Gastroenterology

## 2017-12-28 DIAGNOSIS — E0822 Diabetes mellitus due to underlying condition with diabetic chronic kidney disease: Secondary | ICD-10-CM | POA: Diagnosis not present

## 2017-12-29 DIAGNOSIS — E0822 Diabetes mellitus due to underlying condition with diabetic chronic kidney disease: Secondary | ICD-10-CM | POA: Diagnosis not present

## 2017-12-30 DIAGNOSIS — E0822 Diabetes mellitus due to underlying condition with diabetic chronic kidney disease: Secondary | ICD-10-CM | POA: Diagnosis not present

## 2017-12-31 DIAGNOSIS — E0822 Diabetes mellitus due to underlying condition with diabetic chronic kidney disease: Secondary | ICD-10-CM | POA: Diagnosis not present

## 2018-01-01 DIAGNOSIS — E0822 Diabetes mellitus due to underlying condition with diabetic chronic kidney disease: Secondary | ICD-10-CM | POA: Diagnosis not present

## 2018-01-02 DIAGNOSIS — E0822 Diabetes mellitus due to underlying condition with diabetic chronic kidney disease: Secondary | ICD-10-CM | POA: Diagnosis not present

## 2018-01-03 DIAGNOSIS — E0822 Diabetes mellitus due to underlying condition with diabetic chronic kidney disease: Secondary | ICD-10-CM | POA: Diagnosis not present

## 2018-01-04 DIAGNOSIS — E0822 Diabetes mellitus due to underlying condition with diabetic chronic kidney disease: Secondary | ICD-10-CM | POA: Diagnosis not present

## 2018-01-05 DIAGNOSIS — E0822 Diabetes mellitus due to underlying condition with diabetic chronic kidney disease: Secondary | ICD-10-CM | POA: Diagnosis not present

## 2018-01-06 DIAGNOSIS — E0822 Diabetes mellitus due to underlying condition with diabetic chronic kidney disease: Secondary | ICD-10-CM | POA: Diagnosis not present

## 2018-01-07 DIAGNOSIS — E0822 Diabetes mellitus due to underlying condition with diabetic chronic kidney disease: Secondary | ICD-10-CM | POA: Diagnosis not present

## 2018-01-08 DIAGNOSIS — E0822 Diabetes mellitus due to underlying condition with diabetic chronic kidney disease: Secondary | ICD-10-CM | POA: Diagnosis not present

## 2018-01-09 ENCOUNTER — Ambulatory Visit (HOSPITAL_COMMUNITY)
Admission: RE | Admit: 2018-01-09 | Discharge: 2018-01-09 | Disposition: A | Discharge status: Home / Self Care | Payer: Medicare HMO | Source: Ambulatory Visit | Attending: Cardiology | Admitting: Cardiology

## 2018-01-09 ENCOUNTER — Other Ambulatory Visit: Payer: Self-pay

## 2018-01-09 DIAGNOSIS — R0602 Shortness of breath: Secondary | ICD-10-CM | POA: Diagnosis not present

## 2018-01-09 DIAGNOSIS — E0822 Diabetes mellitus due to underlying condition with diabetic chronic kidney disease: Secondary | ICD-10-CM | POA: Diagnosis not present

## 2018-01-09 DIAGNOSIS — E785 Hyperlipidemia, unspecified: Secondary | ICD-10-CM

## 2018-01-09 DIAGNOSIS — K449 Diaphragmatic hernia without obstruction or gangrene: Secondary | ICD-10-CM | POA: Insufficient documentation

## 2018-01-09 DIAGNOSIS — I2510 Atherosclerotic heart disease of native coronary artery without angina pectoris: Secondary | ICD-10-CM | POA: Insufficient documentation

## 2018-01-09 DIAGNOSIS — I700 Atherosclerosis of aorta: Secondary | ICD-10-CM | POA: Insufficient documentation

## 2018-01-09 MED ORDER — IOPAMIDOL (ISOVUE-370) INJECTION 76%
100.0000 mL | Freq: Once | INTRAVENOUS | Status: AC | PRN
  Administered 2018-01-09: 100 mL via INTRAVENOUS

## 2018-01-09 MED ORDER — METOPROLOL TARTRATE 5 MG/5ML IV SOLN
INTRAVENOUS | Status: AC
  Administered 2018-01-09: 5 mg via INTRAVENOUS
  Filled 2018-01-09: qty 5

## 2018-01-09 MED ORDER — METOPROLOL TARTRATE 5 MG/5ML IV SOLN
5.0000 mg | INTRAVENOUS | Status: DC | PRN
  Administered 2018-01-09: 5 mg via INTRAVENOUS
  Filled 2018-01-09: qty 5

## 2018-01-09 MED ORDER — NITROGLYCERIN 0.4 MG SL SUBL
SUBLINGUAL_TABLET | SUBLINGUAL | Status: AC
  Administered 2018-01-09: 0.8 mg via SUBLINGUAL
  Filled 2018-01-09: qty 2

## 2018-01-09 MED ORDER — NITROGLYCERIN 0.4 MG SL SUBL
0.8000 mg | SUBLINGUAL_TABLET | SUBLINGUAL | Status: DC | PRN
  Administered 2018-01-09: 0.8 mg via SUBLINGUAL
  Filled 2018-01-09: qty 25

## 2018-01-09 MED ORDER — ROSUVASTATIN CALCIUM 10 MG PO TABS: 10.0000 mg | ORAL_TABLET | Freq: Every day | ORAL | 2 refills | Status: DC

## 2018-01-10 DIAGNOSIS — E0822 Diabetes mellitus due to underlying condition with diabetic chronic kidney disease: Secondary | ICD-10-CM | POA: Diagnosis not present

## 2018-01-11 DIAGNOSIS — E0822 Diabetes mellitus due to underlying condition with diabetic chronic kidney disease: Secondary | ICD-10-CM | POA: Diagnosis not present

## 2018-01-12 DIAGNOSIS — E0822 Diabetes mellitus due to underlying condition with diabetic chronic kidney disease: Secondary | ICD-10-CM | POA: Diagnosis not present

## 2018-01-13 DIAGNOSIS — E0822 Diabetes mellitus due to underlying condition with diabetic chronic kidney disease: Secondary | ICD-10-CM | POA: Diagnosis not present

## 2018-01-14 DIAGNOSIS — E0822 Diabetes mellitus due to underlying condition with diabetic chronic kidney disease: Secondary | ICD-10-CM | POA: Diagnosis not present

## 2018-01-16 ENCOUNTER — Other Ambulatory Visit: Payer: Self-pay

## 2018-01-16 DIAGNOSIS — R1032 Left lower quadrant pain: Secondary | ICD-10-CM | POA: Diagnosis not present

## 2018-01-16 DIAGNOSIS — Z6827 Body mass index (BMI) 27.0-27.9, adult: Secondary | ICD-10-CM | POA: Diagnosis not present

## 2018-01-16 DIAGNOSIS — E663 Overweight: Secondary | ICD-10-CM | POA: Diagnosis not present

## 2018-01-16 DIAGNOSIS — J309 Allergic rhinitis, unspecified: Secondary | ICD-10-CM | POA: Diagnosis not present

## 2018-01-16 DIAGNOSIS — M76892 Other specified enthesopathies of left lower limb, excluding foot: Secondary | ICD-10-CM | POA: Diagnosis not present

## 2018-01-16 MED ORDER — ROSUVASTATIN CALCIUM 10 MG PO TABS: 10.0000 mg | ORAL_TABLET | Freq: Every day | ORAL | 2 refills | Status: DC

## 2018-01-22 DIAGNOSIS — E0822 Diabetes mellitus due to underlying condition with diabetic chronic kidney disease: Secondary | ICD-10-CM | POA: Diagnosis not present

## 2018-01-23 ENCOUNTER — Ambulatory Visit: Payer: Medicare HMO | Admitting: Gastroenterology

## 2018-01-23 DIAGNOSIS — E0822 Diabetes mellitus due to underlying condition with diabetic chronic kidney disease: Secondary | ICD-10-CM | POA: Diagnosis not present

## 2018-01-24 DIAGNOSIS — E0822 Diabetes mellitus due to underlying condition with diabetic chronic kidney disease: Secondary | ICD-10-CM | POA: Diagnosis not present

## 2018-01-25 DIAGNOSIS — E0822 Diabetes mellitus due to underlying condition with diabetic chronic kidney disease: Secondary | ICD-10-CM | POA: Diagnosis not present

## 2018-01-26 DIAGNOSIS — E0822 Diabetes mellitus due to underlying condition with diabetic chronic kidney disease: Secondary | ICD-10-CM | POA: Diagnosis not present

## 2018-01-27 DIAGNOSIS — E0822 Diabetes mellitus due to underlying condition with diabetic chronic kidney disease: Secondary | ICD-10-CM | POA: Diagnosis not present

## 2018-01-28 DIAGNOSIS — E0822 Diabetes mellitus due to underlying condition with diabetic chronic kidney disease: Secondary | ICD-10-CM | POA: Diagnosis not present

## 2018-01-29 ENCOUNTER — Telehealth: Payer: Self-pay | Admitting: Gastroenterology

## 2018-01-29 ENCOUNTER — Encounter: Payer: Self-pay | Admitting: Gastroenterology

## 2018-01-29 DIAGNOSIS — E0822 Diabetes mellitus due to underlying condition with diabetic chronic kidney disease: Secondary | ICD-10-CM | POA: Diagnosis not present

## 2018-01-29 NOTE — Telephone Encounter (Signed)
Patient was confused and she will call the doctor she needs to speak to regarding her cholesterol medication.

## 2018-01-30 DIAGNOSIS — E0822 Diabetes mellitus due to underlying condition with diabetic chronic kidney disease: Secondary | ICD-10-CM | POA: Diagnosis not present

## 2018-01-31 DIAGNOSIS — E0822 Diabetes mellitus due to underlying condition with diabetic chronic kidney disease: Secondary | ICD-10-CM | POA: Diagnosis not present

## 2018-02-01 DIAGNOSIS — E0822 Diabetes mellitus due to underlying condition with diabetic chronic kidney disease: Secondary | ICD-10-CM | POA: Diagnosis not present

## 2018-02-02 ENCOUNTER — Encounter: Payer: Self-pay | Admitting: Gastroenterology

## 2018-02-02 ENCOUNTER — Other Ambulatory Visit (INDEPENDENT_AMBULATORY_CARE_PROVIDER_SITE_OTHER): Payer: Medicare HMO

## 2018-02-02 ENCOUNTER — Ambulatory Visit (INDEPENDENT_AMBULATORY_CARE_PROVIDER_SITE_OTHER): Payer: Medicare HMO | Admitting: Gastroenterology

## 2018-02-02 VITALS — BP 144/72 | HR 81 | Ht 65.5 in | Wt 171.1 lb

## 2018-02-02 DIAGNOSIS — K219 Gastro-esophageal reflux disease without esophagitis: Secondary | ICD-10-CM

## 2018-02-02 DIAGNOSIS — E0822 Diabetes mellitus due to underlying condition with diabetic chronic kidney disease: Secondary | ICD-10-CM | POA: Diagnosis not present

## 2018-02-02 DIAGNOSIS — K5909 Other constipation: Secondary | ICD-10-CM | POA: Diagnosis not present

## 2018-02-02 DIAGNOSIS — R1032 Left lower quadrant pain: Secondary | ICD-10-CM

## 2018-02-02 LAB — COMPREHENSIVE METABOLIC PANEL
AG RATIO: 1.8 (calc) (ref 1.0–2.5)
ALBUMIN MSPROF: 4.2 g/dL (ref 3.6–5.1)
ALT: 19 U/L (ref 6–29)
AST: 17 U/L (ref 10–35)
Alkaline phosphatase (APISO): 71 U/L (ref 33–130)
BILIRUBIN TOTAL: 0.2 mg/dL (ref 0.2–1.2)
BUN: 14 mg/dL (ref 7–25)
CALCIUM: 10.1 mg/dL (ref 8.6–10.4)
CO2: 28 mmol/L (ref 20–32)
CREATININE: 0.8 mg/dL (ref 0.60–0.93)
Chloride: 103 mmol/L (ref 98–110)
GLOBULIN: 2.4 g/dL (ref 1.9–3.7)
Glucose, Bld: 80 mg/dL (ref 65–99)
POTASSIUM: 4.5 mmol/L (ref 3.5–5.3)
Sodium: 140 mmol/L (ref 135–146)
TOTAL PROTEIN: 6.6 g/dL (ref 6.1–8.1)

## 2018-02-02 LAB — CBC WITH DIFFERENTIAL/PLATELET
BASOS PCT: 0.8 %
Basophils Absolute: 53 cells/uL (ref 0–200)
EOS ABS: 152 {cells}/uL (ref 15–500)
EOS PCT: 2.3 %
HCT: 38.3 % (ref 35.0–45.0)
HEMOGLOBIN: 12.6 g/dL (ref 11.7–15.5)
Lymphs Abs: 2006 cells/uL (ref 850–3900)
MCH: 28 pg (ref 27.0–33.0)
MCHC: 32.9 g/dL (ref 32.0–36.0)
MCV: 85.1 fL (ref 80.0–100.0)
MPV: 9.7 fL (ref 7.5–12.5)
Monocytes Relative: 10.7 %
NEUTROS ABS: 3683 {cells}/uL (ref 1500–7800)
Neutrophils Relative %: 55.8 %
Platelets: 435 10*3/uL — ABNORMAL HIGH (ref 140–400)
RBC: 4.5 10*6/uL (ref 3.80–5.10)
RDW: 13.2 % (ref 11.0–15.0)
Total Lymphocyte: 30.4 %
WBC mixed population: 706 cells/uL (ref 200–950)
WBC: 6.6 10*3/uL (ref 3.8–10.8)

## 2018-02-02 MED ORDER — HYDROCORTISONE 2.5 % RE CREA: 1.0000 "application " | TOPICAL_CREAM | Freq: Two times a day (BID) | RECTAL | 1 refills | Status: DC

## 2018-02-02 NOTE — Progress Notes (Signed)
IMPRESSION and PLAN:    #1.  LLQ pain. ? Etiology, has been treated empirically with doxycycline for acute diverticulitis. Colon 08/2015 colonic polyp status post polypectomy (TA), mild pancolonic diverticulosis. #2.  Chronic constipation (likely exacerbated by medications including calcium channel blockers and Cox 2 inhibitors). #3. GERD with mod HH (EGD 08/2015). #4.  Internal hemorrhoids. #5.  History of colonic polyps.  Plan: - Continue Linzess 72 mcg po QOD (every day causes her to have diarrhea). - Hydrocortisone 2.5% bid x 2 weeks.  - CT scan abdomen/pelvis with p.o. and IV contrast. - Check CBC, CMP today. - Continue omeprazole 20 mg p.o. twice daily as before.  Change Zantac to 300 mg nightly - Follow-up in 12 weeks, earlier in case of any problems.      HPI:    Chief Complaint:   WESTON FULCO is a 72 y.o. female  For follow-up visit Had left lower quadrant abdominal pain but has been having hip pain as well on the left side status post steroid shots in the past. Constipation but better with Linzess every other day.  She would have diarrhea if she takes it every day. No fever or chills Treated with doxycycline as an empiric treatment for acute diverticulitis. Denies having any melena or hematochezia No weight loss  Past GI procedures: -EGD 08/17/2015 moderate hiatal hernia, negative small bowel biopsies -Colonoscopy 08/17/2015 8 mm sessile colon polyp status post polypectomy, mild pancolonic diverticulosis, highly redundant colon.  Biopsies tubular adenoma.  Next due 08/2020.  Earlier, if with any new problems or continued problems. Past Medical History:  Diagnosis Date  . Angina    normal stress test  . Bronchitis   . 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 Diabetes mellitus   . Diabetes mellitus due to underlying condition with unspecified complications ([place]) 05/18/5091  . Dyslipidemia  06/10/2015  . Essential hypertension 06/10/2015  . GERD (gastroesophageal reflux disease)   . H/O hiatal hernia   . Heart murmur   . Hyperlipemia   . Hypertension   . Nonspecific (abnormal) findings on radiological and other examination of gastrointestinal tract 05/19/2011  . Pain and swelling of toe of right foot 07/15/2016  . Palpitations 06/02/2016  . Shortness of breath    on  excertion    Current Outpatient Medications  Medication Sig Dispense Refill  . albuterol (PROVENTIL HFA;VENTOLIN HFA) 108 (90 Base) MCG/ACT inhaler Inhale 2 puffs into the lungs every 6 (six) hours as needed for wheezing or shortness of breath. 3 Inhaler 3  . Albuterol (VENTOLIN IN) Inhale 2 puffs into the lungs every 6 (six) hours as needed (wheezing).     . ALPRAZolam (XANAX) 0.5 MG tablet Take 1 tablet (0.5 mg total) by mouth as needed for anxiety. 5 tablet 0  . amLODipine (NORVASC) 10 MG tablet Take 10 mg by mouth daily.    Marland Kitchen aspirin 81 MG tablet Take 81 mg by mouth daily.     . BD PEN NEEDLE NANO U/F 32G X 4 MM MISC     . budesonide-formoterol (SYMBICORT) 160-4.5 MCG/ACT inhaler Inhale 2 puffs into the lungs 2 (two) times daily. 3 Inhaler 3  . buPROPion (WELLBUTRIN XL) 150 MG 24 hr tablet Take 150 mg by mouth daily.    . celecoxib (CELEBREX) 100 MG capsule Take 100 mg by mouth 2 (two) times daily.    . Cholecalciferol (VITAMIN D PO) Take 5,000 Units by  mouth 2 (two) times daily.     Marland Kitchen EPIPEN 2-PAK 0.3 MG/0.3ML SOAJ injection     . escitalopram (LEXAPRO) 10 MG tablet Take 10 mg by mouth daily.    . fluticasone (VERAMYST) 27.5 MCG/SPRAY nasal spray Place 2 sprays into the nose daily.    . hydrALAZINE (APRESOLINE) 25 MG tablet Take 37.5 mg by mouth 3 (three) times daily.    . isosorbide mononitrate (IMDUR) 60 MG 24 hr tablet Take 60 mg by mouth daily.    . Lancets (ACCU-CHEK MULTICLIX) lancets     . losartan-hydrochlorothiazide (HYZAAR) 100-25 MG tablet Take 1 tablet by mouth daily.    . metFORMIN (GLUCOPHAGE)  500 MG tablet Take 500 mg by mouth 2 (two) times daily with a meal.     . montelukast (SINGULAIR) 10 MG tablet Take 10 mg by mouth daily.    . Multiple Vitamin (MULTIVITAMIN) capsule Take 1 capsule by mouth daily.      . nitroGLYCERIN (NITROSTAT) 0.4 MG SL tablet Place 1 tablet (0.4 mg total) under the tongue every 5 (five) minutes x 3 doses as needed for chest pain. 25 tablet 11  . omeprazole (PRILOSEC) 20 MG capsule Take 20 mg by mouth 2 (two) times daily before a meal.     . ranitidine (ZANTAC) 300 MG tablet Take 1 tablet (300 mg total) by mouth 2 (two) times daily.    . rosuvastatin (CRESTOR) 10 MG tablet Take 1 tablet (10 mg total) by mouth daily. 90 tablet 2  . traMADol (ULTRAM) 50 MG tablet Take 50 mg by mouth 2 (two) times daily as needed for pain.    Marland Kitchen VICTOZA 18 MG/3ML SOPN 12 mg daily.      No current facility-administered medications for this visit.     Past Surgical History:  Procedure Laterality Date  . ABDOMINAL HYSTERECTOMY    . ABDOMINAL HYSTERECTOMY    . BREAST SURGERY     begin mass,left  breast  . COLONOSCOPY  08/17/2015   Colonic polyp status post polypectomy. Mild pancolonic diverticulosis. Highly redundant colon.   Marland Kitchen DILATION AND CURETTAGE OF UTERUS     one  . ESOPHAGOGASTRODUODENOSCOPY  08/17/2015   Moderate hiatal hernia. Otherwise noraml EGD.   . EUS  05/19/2011   Procedure: UPPER ENDOSCOPIC ULTRASOUND (EUS) LINEAR;  Surgeon: Owens Loffler, MD;  Location: [facility];  Service: Endoscopy;  Laterality: N/A;  radial linear   . HYSTEROTOMY    . KNEE SURGERY    . LEFT HEART CATH AND CORONARY ANGIOGRAPHY    . right thumb     tendon repaire  . SHOULDER SURGERY     rotator cuff repair  . WRIST SURGERY     rigth    Family History  Problem Relation Age of Onset  . Diabetes Sister   . Hypertension Daughter     Social History   Tobacco Use  . Smoking status: Former Smoker    Packs/day: 1.00    Last attempt to quit: 05/17/2011    Years since quitting: 6.7   . Smokeless tobacco: Never Used  Substance Use Topics  . Alcohol use: No  . Drug use: No    Allergies  Allergen Reactions  . Penicillins Rash    Reaction was 30years ago  Has never taken again     Review of Systems: All systems reviewed and negative except where noted in HPI.    Physical Exam:     BP (!) 144/72   Pulse 81  Ht 5' 5.5" (1.664 m)   Wt 171 lb 2 oz (77.6 kg)   BMI 28.04 kg/m  @WEIGHTLAST3 @ GENERAL:  Alert, oriented, cooperative, not in acute distress. PSYCH: :Pleasant, normal mood and affect. HEENT:  conjunctiva pink, mucous membranes moist, neck supple without masses. No jaundice. CARDIAC:  S1 S2 normal. No murmers. PULM: Normal respiratory effort, lungs CTA bilaterally, no wheezing. ABDOMEN: Inspection: No visible peristalsis, no abnormal pulsations, skin normal.  Palpation/percussion: Soft, mild left lower quadrant abdominal tenderness, no rebound, nondistended, no rigidity, no abnormal dullness to percussion, no hepatosplenomegaly and no palpable abdominal masses.  Auscultation: Normal bowel sounds, no abdominal bruits. Rectal exam: Deferred SKIN:  turgor, no lesions seen. Musculoskeletal:  Normal muscle tone, normal strength. NEURO: Alert and oriented x 3, no focal neurologic deficits. Discussed with patient's daughter in detail. 25 minutes spent with the patient today. Greater than 50% was spent in counseling and coordination of care with the patient    Jordis Repetto,MD 02/02/2018, 2:22 PM   CC [name], MD

## 2018-02-02 NOTE — Patient Instructions (Signed)
If you are age 74 or older, your body mass index should be between 23-30. Your Body mass index is 28.04 kg/m. If this is out of the aforementioned range listed, please consider follow up with your Primary Care Provider.  If you are age 20 or younger, your body mass index should be between 19-25. Your Body mass index is 28.04 kg/m. If this is out of the aformentioned range listed, please consider follow up with your Primary Care Provider.   You have been scheduled for a CT scan of the abdomen and pelvis at [facility] ([address]---this is in the same building as Press [facility]).   You are scheduled on 02/09/2018 at 9:30am. You should arrive 15 minutes prior to your appointment time for registration. Please follow the written instructions below on the day of your exam:  WARNING: IF YOU ARE ALLERGIC TO IODINE/X-RAY DYE, PLEASE NOTIFY RADIOLOGY IMMEDIATELY AT [phone]! YOU WILL BE GIVEN A 13 HOUR PREMEDICATION PREP.  1) Do not eat or drink anything after 5:30am (4 hours prior to your test) 2) You have been given 2 bottles of oral contrast to drink. The solution may taste better if refrigerated, but do NOT add ice or any other liquid to this solution. Shake well before drinking.    Drink 1 bottle of contrast @ 7:30am (2 hours prior to your exam)  Drink 1 bottle of contrast @ 8:30am (1 hour prior to your exam)  You may take any medications as prescribed with a small amount of water, if necessary. If you take any of the following medications: METFORMIN, GLUCOPHAGE, GLUCOVANCE, AVANDAMET, RIOMET, FORTAMET, [place] MET, JANUMET, GLUMETZA or METAGLIP, you MAY be asked to HOLD this medication 48 hours AFTER the exam.  The purpose of you drinking the oral contrast is to aid in the visualization of your intestinal tract. The contrast solution may cause some diarrhea. Depending on your individual set of symptoms, you may also receive an intravenous injection of x-ray contrast/dye.  Plan on being at [facility] for 30 minutes or longer, depending on the type of exam you are having performed.  This test typically takes 30-45 minutes to complete.  If you have any questions regarding your exam or if you need to reschedule, you may call the CT department at [phone] between the hours of 8:00 am and 5:00 pm, Monday-Friday.  ________________________________________________________________________  We have sent the following medications to your pharmacy for you to pick up at your convenience: Anusol Cream 2.5% for 14 days  Please go to the lab on the 2nd floor [address] before you leave the office today.  Thank you,  Dr. Jackquline Denmark

## 2018-02-03 DIAGNOSIS — E0822 Diabetes mellitus due to underlying condition with diabetic chronic kidney disease: Secondary | ICD-10-CM | POA: Diagnosis not present

## 2018-02-04 DIAGNOSIS — E0822 Diabetes mellitus due to underlying condition with diabetic chronic kidney disease: Secondary | ICD-10-CM | POA: Diagnosis not present

## 2018-02-05 DIAGNOSIS — E0822 Diabetes mellitus due to underlying condition with diabetic chronic kidney disease: Secondary | ICD-10-CM | POA: Diagnosis not present

## 2018-02-06 DIAGNOSIS — E0822 Diabetes mellitus due to underlying condition with diabetic chronic kidney disease: Secondary | ICD-10-CM | POA: Diagnosis not present

## 2018-02-07 DIAGNOSIS — I10 Essential (primary) hypertension: Secondary | ICD-10-CM | POA: Diagnosis not present

## 2018-02-07 DIAGNOSIS — E119 Type 2 diabetes mellitus without complications: Secondary | ICD-10-CM | POA: Diagnosis not present

## 2018-02-07 DIAGNOSIS — F419 Anxiety disorder, unspecified: Secondary | ICD-10-CM | POA: Diagnosis not present

## 2018-02-07 DIAGNOSIS — J309 Allergic rhinitis, unspecified: Secondary | ICD-10-CM | POA: Diagnosis not present

## 2018-02-07 DIAGNOSIS — Z6827 Body mass index (BMI) 27.0-27.9, adult: Secondary | ICD-10-CM | POA: Diagnosis not present

## 2018-02-07 DIAGNOSIS — Z23 Encounter for immunization: Secondary | ICD-10-CM | POA: Diagnosis not present

## 2018-02-07 DIAGNOSIS — E0822 Diabetes mellitus due to underlying condition with diabetic chronic kidney disease: Secondary | ICD-10-CM | POA: Diagnosis not present

## 2018-02-07 DIAGNOSIS — Z79899 Other long term (current) drug therapy: Secondary | ICD-10-CM | POA: Diagnosis not present

## 2018-02-08 DIAGNOSIS — E0822 Diabetes mellitus due to underlying condition with diabetic chronic kidney disease: Secondary | ICD-10-CM | POA: Diagnosis not present

## 2018-02-09 ENCOUNTER — Ambulatory Visit (HOSPITAL_BASED_OUTPATIENT_CLINIC_OR_DEPARTMENT_OTHER)
Admission: RE | Admit: 2018-02-09 | Discharge: 2018-02-09 | Disposition: A | Discharge status: Home / Self Care | Payer: Medicare HMO | Source: Ambulatory Visit | Attending: Gastroenterology | Admitting: Gastroenterology

## 2018-02-09 ENCOUNTER — Encounter (HOSPITAL_BASED_OUTPATIENT_CLINIC_OR_DEPARTMENT_OTHER): Payer: Self-pay

## 2018-02-09 DIAGNOSIS — I700 Atherosclerosis of aorta: Secondary | ICD-10-CM | POA: Diagnosis not present

## 2018-02-09 DIAGNOSIS — R1032 Left lower quadrant pain: Secondary | ICD-10-CM | POA: Insufficient documentation

## 2018-02-09 DIAGNOSIS — K5909 Other constipation: Secondary | ICD-10-CM | POA: Diagnosis not present

## 2018-02-09 DIAGNOSIS — E0822 Diabetes mellitus due to underlying condition with diabetic chronic kidney disease: Secondary | ICD-10-CM | POA: Diagnosis not present

## 2018-02-09 DIAGNOSIS — D3502 Benign neoplasm of left adrenal gland: Secondary | ICD-10-CM | POA: Diagnosis not present

## 2018-02-09 DIAGNOSIS — K219 Gastro-esophageal reflux disease without esophagitis: Secondary | ICD-10-CM | POA: Diagnosis not present

## 2018-02-09 DIAGNOSIS — C642 Malignant neoplasm of left kidney, except renal pelvis: Secondary | ICD-10-CM | POA: Diagnosis not present

## 2018-02-09 DIAGNOSIS — K458 Other specified abdominal hernia without obstruction or gangrene: Secondary | ICD-10-CM | POA: Diagnosis not present

## 2018-02-09 HISTORY — DX: Unspecified asthma, uncomplicated: J45.909

## 2018-02-09 MED ORDER — IOPAMIDOL (ISOVUE-300) INJECTION 61%
100.0000 mL | Freq: Once | INTRAVENOUS | Status: AC | PRN
  Administered 2018-02-09: 100 mL via INTRAVENOUS

## 2018-02-10 DIAGNOSIS — E0822 Diabetes mellitus due to underlying condition with diabetic chronic kidney disease: Secondary | ICD-10-CM | POA: Diagnosis not present

## 2018-02-11 DIAGNOSIS — E0822 Diabetes mellitus due to underlying condition with diabetic chronic kidney disease: Secondary | ICD-10-CM | POA: Diagnosis not present

## 2018-02-12 DIAGNOSIS — E0822 Diabetes mellitus due to underlying condition with diabetic chronic kidney disease: Secondary | ICD-10-CM | POA: Diagnosis not present

## 2018-02-13 DIAGNOSIS — E0822 Diabetes mellitus due to underlying condition with diabetic chronic kidney disease: Secondary | ICD-10-CM | POA: Diagnosis not present

## 2018-02-14 DIAGNOSIS — E0822 Diabetes mellitus due to underlying condition with diabetic chronic kidney disease: Secondary | ICD-10-CM | POA: Diagnosis not present

## 2018-02-15 DIAGNOSIS — E0822 Diabetes mellitus due to underlying condition with diabetic chronic kidney disease: Secondary | ICD-10-CM | POA: Diagnosis not present

## 2018-02-16 DIAGNOSIS — E0822 Diabetes mellitus due to underlying condition with diabetic chronic kidney disease: Secondary | ICD-10-CM | POA: Diagnosis not present

## 2018-02-17 DIAGNOSIS — E0822 Diabetes mellitus due to underlying condition with diabetic chronic kidney disease: Secondary | ICD-10-CM | POA: Diagnosis not present

## 2018-02-18 DIAGNOSIS — E0822 Diabetes mellitus due to underlying condition with diabetic chronic kidney disease: Secondary | ICD-10-CM | POA: Diagnosis not present

## 2018-02-19 DIAGNOSIS — E0822 Diabetes mellitus due to underlying condition with diabetic chronic kidney disease: Secondary | ICD-10-CM | POA: Diagnosis not present

## 2018-02-20 DIAGNOSIS — E0822 Diabetes mellitus due to underlying condition with diabetic chronic kidney disease: Secondary | ICD-10-CM | POA: Diagnosis not present

## 2018-02-21 DIAGNOSIS — E0822 Diabetes mellitus due to underlying condition with diabetic chronic kidney disease: Secondary | ICD-10-CM | POA: Diagnosis not present

## 2018-02-22 DIAGNOSIS — E0822 Diabetes mellitus due to underlying condition with diabetic chronic kidney disease: Secondary | ICD-10-CM | POA: Diagnosis not present

## 2018-02-23 DIAGNOSIS — E0822 Diabetes mellitus due to underlying condition with diabetic chronic kidney disease: Secondary | ICD-10-CM | POA: Diagnosis not present

## 2018-02-24 DIAGNOSIS — E0822 Diabetes mellitus due to underlying condition with diabetic chronic kidney disease: Secondary | ICD-10-CM | POA: Diagnosis not present

## 2018-02-24 DIAGNOSIS — J309 Allergic rhinitis, unspecified: Secondary | ICD-10-CM | POA: Diagnosis not present

## 2018-02-24 DIAGNOSIS — E119 Type 2 diabetes mellitus without complications: Secondary | ICD-10-CM | POA: Diagnosis not present

## 2018-02-25 DIAGNOSIS — E0822 Diabetes mellitus due to underlying condition with diabetic chronic kidney disease: Secondary | ICD-10-CM | POA: Diagnosis not present

## 2018-02-26 DIAGNOSIS — E0822 Diabetes mellitus due to underlying condition with diabetic chronic kidney disease: Secondary | ICD-10-CM | POA: Diagnosis not present

## 2018-02-27 DIAGNOSIS — R1902 Left upper quadrant abdominal swelling, mass and lump: Secondary | ICD-10-CM | POA: Diagnosis not present

## 2018-02-27 DIAGNOSIS — E0822 Diabetes mellitus due to underlying condition with diabetic chronic kidney disease: Secondary | ICD-10-CM | POA: Diagnosis not present

## 2018-02-28 DIAGNOSIS — E0822 Diabetes mellitus due to underlying condition with diabetic chronic kidney disease: Secondary | ICD-10-CM | POA: Diagnosis not present

## 2018-02-28 DIAGNOSIS — Z6828 Body mass index (BMI) 28.0-28.9, adult: Secondary | ICD-10-CM | POA: Diagnosis not present

## 2018-02-28 DIAGNOSIS — R109 Unspecified abdominal pain: Secondary | ICD-10-CM | POA: Diagnosis not present

## 2018-03-01 DIAGNOSIS — E0822 Diabetes mellitus due to underlying condition with diabetic chronic kidney disease: Secondary | ICD-10-CM | POA: Diagnosis not present

## 2018-03-02 DIAGNOSIS — E0822 Diabetes mellitus due to underlying condition with diabetic chronic kidney disease: Secondary | ICD-10-CM | POA: Diagnosis not present

## 2018-03-03 DIAGNOSIS — E0822 Diabetes mellitus due to underlying condition with diabetic chronic kidney disease: Secondary | ICD-10-CM | POA: Diagnosis not present

## 2018-03-04 DIAGNOSIS — E0822 Diabetes mellitus due to underlying condition with diabetic chronic kidney disease: Secondary | ICD-10-CM | POA: Diagnosis not present

## 2018-03-05 DIAGNOSIS — E0822 Diabetes mellitus due to underlying condition with diabetic chronic kidney disease: Secondary | ICD-10-CM | POA: Diagnosis not present

## 2018-03-06 ENCOUNTER — Other Ambulatory Visit: Payer: Self-pay | Admitting: *Deleted

## 2018-03-06 ENCOUNTER — Telehealth: Payer: Self-pay | Admitting: Gastroenterology

## 2018-03-06 ENCOUNTER — Other Ambulatory Visit: Payer: Self-pay

## 2018-03-06 DIAGNOSIS — E0822 Diabetes mellitus due to underlying condition with diabetic chronic kidney disease: Secondary | ICD-10-CM | POA: Diagnosis not present

## 2018-03-06 DIAGNOSIS — E785 Hyperlipidemia, unspecified: Secondary | ICD-10-CM | POA: Diagnosis not present

## 2018-03-06 MED ORDER — FAMOTIDINE 40 MG PO TABS: 40.0000 mg | ORAL_TABLET | Freq: Two times a day (BID) | ORAL | 3 refills | Status: DC

## 2018-03-06 NOTE — Telephone Encounter (Signed)
Pt heard the news about zantac, she takes ranitidine and wants to know if they are the same and if she should change to a different medication. Pls call her.

## 2018-03-06 NOTE — Telephone Encounter (Signed)
Spoke with pt-pt advised to stop Zantac and begin Pepcid 40 mg PO BID; pt verbalized understanding of instructions and will call back if need arises; order for Pepcid placed;

## 2018-03-07 DIAGNOSIS — E0822 Diabetes mellitus due to underlying condition with diabetic chronic kidney disease: Secondary | ICD-10-CM | POA: Diagnosis not present

## 2018-03-07 LAB — LIPID PANEL
Chol/HDL Ratio: 1.9 ratio (ref 0.0–4.4)
Cholesterol, Total: 169 mg/dL (ref 100–199)
HDL: 88 mg/dL
LDL Calculated: 61 mg/dL (ref 0–99)
Triglycerides: 101 mg/dL (ref 0–149)
VLDL Cholesterol Cal: 20 mg/dL (ref 5–40)

## 2018-03-07 LAB — HEPATIC FUNCTION PANEL
ALK PHOS: 79 IU/L (ref 39–117)
ALT: 18 IU/L (ref 0–32)
AST: 17 IU/L (ref 0–40)
Albumin: 4.4 g/dL (ref 3.5–4.8)
BILIRUBIN, DIRECT: 0.06 mg/dL (ref 0.00–0.40)
Total Protein: 6.8 g/dL (ref 6.0–8.5)

## 2018-03-08 DIAGNOSIS — E0822 Diabetes mellitus due to underlying condition with diabetic chronic kidney disease: Secondary | ICD-10-CM | POA: Diagnosis not present

## 2018-03-09 DIAGNOSIS — E0822 Diabetes mellitus due to underlying condition with diabetic chronic kidney disease: Secondary | ICD-10-CM | POA: Diagnosis not present

## 2018-03-10 DIAGNOSIS — E0822 Diabetes mellitus due to underlying condition with diabetic chronic kidney disease: Secondary | ICD-10-CM | POA: Diagnosis not present

## 2018-03-11 DIAGNOSIS — E0822 Diabetes mellitus due to underlying condition with diabetic chronic kidney disease: Secondary | ICD-10-CM | POA: Diagnosis not present

## 2018-03-12 ENCOUNTER — Telehealth: Payer: Self-pay

## 2018-03-12 DIAGNOSIS — E0822 Diabetes mellitus due to underlying condition with diabetic chronic kidney disease: Secondary | ICD-10-CM | POA: Diagnosis not present

## 2018-03-12 NOTE — Telephone Encounter (Signed)
-----   Message from [name], MD sent at 03/09/2018  5:00 PM EDT ----- The results of the study is unremarkable. Please inform patient. I will discuss in detail at next appointment. Cc  primary care/referring physician [name], MD 03/09/2018 5:00 PM

## 2018-03-12 NOTE — Telephone Encounter (Signed)
Patients labs were normal will mail to patients home

## 2018-03-13 DIAGNOSIS — E0822 Diabetes mellitus due to underlying condition with diabetic chronic kidney disease: Secondary | ICD-10-CM | POA: Diagnosis not present

## 2018-03-14 DIAGNOSIS — E0822 Diabetes mellitus due to underlying condition with diabetic chronic kidney disease: Secondary | ICD-10-CM | POA: Diagnosis not present

## 2018-03-15 DIAGNOSIS — E0822 Diabetes mellitus due to underlying condition with diabetic chronic kidney disease: Secondary | ICD-10-CM | POA: Diagnosis not present

## 2018-03-16 DIAGNOSIS — E0822 Diabetes mellitus due to underlying condition with diabetic chronic kidney disease: Secondary | ICD-10-CM | POA: Diagnosis not present

## 2018-03-17 DIAGNOSIS — E0822 Diabetes mellitus due to underlying condition with diabetic chronic kidney disease: Secondary | ICD-10-CM | POA: Diagnosis not present

## 2018-03-18 DIAGNOSIS — E0822 Diabetes mellitus due to underlying condition with diabetic chronic kidney disease: Secondary | ICD-10-CM | POA: Diagnosis not present

## 2018-03-19 DIAGNOSIS — E0822 Diabetes mellitus due to underlying condition with diabetic chronic kidney disease: Secondary | ICD-10-CM | POA: Diagnosis not present

## 2018-03-20 DIAGNOSIS — E0822 Diabetes mellitus due to underlying condition with diabetic chronic kidney disease: Secondary | ICD-10-CM | POA: Diagnosis not present

## 2018-03-21 DIAGNOSIS — E0822 Diabetes mellitus due to underlying condition with diabetic chronic kidney disease: Secondary | ICD-10-CM | POA: Diagnosis not present

## 2018-03-22 DIAGNOSIS — Z6828 Body mass index (BMI) 28.0-28.9, adult: Secondary | ICD-10-CM | POA: Diagnosis not present

## 2018-03-22 DIAGNOSIS — K219 Gastro-esophageal reflux disease without esophagitis: Secondary | ICD-10-CM | POA: Diagnosis not present

## 2018-03-22 DIAGNOSIS — E0822 Diabetes mellitus due to underlying condition with diabetic chronic kidney disease: Secondary | ICD-10-CM | POA: Diagnosis not present

## 2018-03-22 DIAGNOSIS — M79606 Pain in leg, unspecified: Secondary | ICD-10-CM | POA: Diagnosis not present

## 2018-03-22 DIAGNOSIS — E669 Obesity, unspecified: Secondary | ICD-10-CM | POA: Diagnosis not present

## 2018-03-22 DIAGNOSIS — J309 Allergic rhinitis, unspecified: Secondary | ICD-10-CM | POA: Diagnosis not present

## 2018-03-23 DIAGNOSIS — E0822 Diabetes mellitus due to underlying condition with diabetic chronic kidney disease: Secondary | ICD-10-CM | POA: Diagnosis not present

## 2018-03-24 DIAGNOSIS — E0822 Diabetes mellitus due to underlying condition with diabetic chronic kidney disease: Secondary | ICD-10-CM | POA: Diagnosis not present

## 2018-03-25 DIAGNOSIS — E0822 Diabetes mellitus due to underlying condition with diabetic chronic kidney disease: Secondary | ICD-10-CM | POA: Diagnosis not present

## 2018-03-30 DIAGNOSIS — D509 Iron deficiency anemia, unspecified: Secondary | ICD-10-CM | POA: Diagnosis not present

## 2018-04-02 DIAGNOSIS — E0822 Diabetes mellitus due to underlying condition with diabetic chronic kidney disease: Secondary | ICD-10-CM | POA: Diagnosis not present

## 2018-04-03 DIAGNOSIS — E0822 Diabetes mellitus due to underlying condition with diabetic chronic kidney disease: Secondary | ICD-10-CM | POA: Diagnosis not present

## 2018-04-04 DIAGNOSIS — E0822 Diabetes mellitus due to underlying condition with diabetic chronic kidney disease: Secondary | ICD-10-CM | POA: Diagnosis not present

## 2018-04-05 DIAGNOSIS — E0822 Diabetes mellitus due to underlying condition with diabetic chronic kidney disease: Secondary | ICD-10-CM | POA: Diagnosis not present

## 2018-04-06 DIAGNOSIS — E0822 Diabetes mellitus due to underlying condition with diabetic chronic kidney disease: Secondary | ICD-10-CM | POA: Diagnosis not present

## 2018-04-07 DIAGNOSIS — E0822 Diabetes mellitus due to underlying condition with diabetic chronic kidney disease: Secondary | ICD-10-CM | POA: Diagnosis not present

## 2018-04-08 DIAGNOSIS — E0822 Diabetes mellitus due to underlying condition with diabetic chronic kidney disease: Secondary | ICD-10-CM | POA: Diagnosis not present

## 2018-04-09 ENCOUNTER — Encounter: Payer: Self-pay | Admitting: Neurology

## 2018-04-09 DIAGNOSIS — D509 Iron deficiency anemia, unspecified: Secondary | ICD-10-CM | POA: Diagnosis not present

## 2018-04-16 DIAGNOSIS — D473 Essential (hemorrhagic) thrombocythemia: Secondary | ICD-10-CM | POA: Diagnosis not present

## 2018-04-16 DIAGNOSIS — E0822 Diabetes mellitus due to underlying condition with diabetic chronic kidney disease: Secondary | ICD-10-CM | POA: Diagnosis not present

## 2018-04-17 DIAGNOSIS — E0822 Diabetes mellitus due to underlying condition with diabetic chronic kidney disease: Secondary | ICD-10-CM | POA: Diagnosis not present

## 2018-04-18 DIAGNOSIS — E0822 Diabetes mellitus due to underlying condition with diabetic chronic kidney disease: Secondary | ICD-10-CM | POA: Diagnosis not present

## 2018-04-19 DIAGNOSIS — Z6828 Body mass index (BMI) 28.0-28.9, adult: Secondary | ICD-10-CM | POA: Diagnosis not present

## 2018-04-19 DIAGNOSIS — E0822 Diabetes mellitus due to underlying condition with diabetic chronic kidney disease: Secondary | ICD-10-CM | POA: Diagnosis not present

## 2018-04-19 DIAGNOSIS — M79606 Pain in leg, unspecified: Secondary | ICD-10-CM | POA: Diagnosis not present

## 2018-04-19 DIAGNOSIS — J309 Allergic rhinitis, unspecified: Secondary | ICD-10-CM | POA: Diagnosis not present

## 2018-04-20 ENCOUNTER — Ambulatory Visit: Payer: Medicare HMO | Admitting: Gastroenterology

## 2018-04-20 DIAGNOSIS — E0822 Diabetes mellitus due to underlying condition with diabetic chronic kidney disease: Secondary | ICD-10-CM | POA: Diagnosis not present

## 2018-04-21 DIAGNOSIS — E0822 Diabetes mellitus due to underlying condition with diabetic chronic kidney disease: Secondary | ICD-10-CM | POA: Diagnosis not present

## 2018-04-22 DIAGNOSIS — E0822 Diabetes mellitus due to underlying condition with diabetic chronic kidney disease: Secondary | ICD-10-CM | POA: Diagnosis not present

## 2018-04-23 DIAGNOSIS — E0822 Diabetes mellitus due to underlying condition with diabetic chronic kidney disease: Secondary | ICD-10-CM | POA: Diagnosis not present

## 2018-04-24 DIAGNOSIS — E0822 Diabetes mellitus due to underlying condition with diabetic chronic kidney disease: Secondary | ICD-10-CM | POA: Diagnosis not present

## 2018-04-25 DIAGNOSIS — E0822 Diabetes mellitus due to underlying condition with diabetic chronic kidney disease: Secondary | ICD-10-CM | POA: Diagnosis not present

## 2018-04-26 DIAGNOSIS — E0822 Diabetes mellitus due to underlying condition with diabetic chronic kidney disease: Secondary | ICD-10-CM | POA: Diagnosis not present

## 2018-04-27 DIAGNOSIS — E0822 Diabetes mellitus due to underlying condition with diabetic chronic kidney disease: Secondary | ICD-10-CM | POA: Diagnosis not present

## 2018-04-28 DIAGNOSIS — E0822 Diabetes mellitus due to underlying condition with diabetic chronic kidney disease: Secondary | ICD-10-CM | POA: Diagnosis not present

## 2018-04-29 DIAGNOSIS — E0822 Diabetes mellitus due to underlying condition with diabetic chronic kidney disease: Secondary | ICD-10-CM | POA: Diagnosis not present

## 2018-04-30 DIAGNOSIS — E0822 Diabetes mellitus due to underlying condition with diabetic chronic kidney disease: Secondary | ICD-10-CM | POA: Diagnosis not present

## 2018-05-01 DIAGNOSIS — E0822 Diabetes mellitus due to underlying condition with diabetic chronic kidney disease: Secondary | ICD-10-CM | POA: Diagnosis not present

## 2018-05-02 DIAGNOSIS — E0822 Diabetes mellitus due to underlying condition with diabetic chronic kidney disease: Secondary | ICD-10-CM | POA: Diagnosis not present

## 2018-05-03 DIAGNOSIS — E0822 Diabetes mellitus due to underlying condition with diabetic chronic kidney disease: Secondary | ICD-10-CM | POA: Diagnosis not present

## 2018-05-04 DIAGNOSIS — E0822 Diabetes mellitus due to underlying condition with diabetic chronic kidney disease: Secondary | ICD-10-CM | POA: Diagnosis not present

## 2018-05-05 DIAGNOSIS — E0822 Diabetes mellitus due to underlying condition with diabetic chronic kidney disease: Secondary | ICD-10-CM | POA: Diagnosis not present

## 2018-05-06 DIAGNOSIS — E0822 Diabetes mellitus due to underlying condition with diabetic chronic kidney disease: Secondary | ICD-10-CM | POA: Diagnosis not present

## 2018-05-07 DIAGNOSIS — E0822 Diabetes mellitus due to underlying condition with diabetic chronic kidney disease: Secondary | ICD-10-CM | POA: Diagnosis not present

## 2018-05-08 DIAGNOSIS — E0822 Diabetes mellitus due to underlying condition with diabetic chronic kidney disease: Secondary | ICD-10-CM | POA: Diagnosis not present

## 2018-05-09 DIAGNOSIS — E0822 Diabetes mellitus due to underlying condition with diabetic chronic kidney disease: Secondary | ICD-10-CM | POA: Diagnosis not present

## 2018-05-10 DIAGNOSIS — E0822 Diabetes mellitus due to underlying condition with diabetic chronic kidney disease: Secondary | ICD-10-CM | POA: Diagnosis not present

## 2018-05-11 DIAGNOSIS — E0822 Diabetes mellitus due to underlying condition with diabetic chronic kidney disease: Secondary | ICD-10-CM | POA: Diagnosis not present

## 2018-05-12 DIAGNOSIS — E0822 Diabetes mellitus due to underlying condition with diabetic chronic kidney disease: Secondary | ICD-10-CM | POA: Diagnosis not present

## 2018-05-13 DIAGNOSIS — E0822 Diabetes mellitus due to underlying condition with diabetic chronic kidney disease: Secondary | ICD-10-CM | POA: Diagnosis not present

## 2018-05-14 DIAGNOSIS — E0822 Diabetes mellitus due to underlying condition with diabetic chronic kidney disease: Secondary | ICD-10-CM | POA: Diagnosis not present

## 2018-05-15 DIAGNOSIS — E0822 Diabetes mellitus due to underlying condition with diabetic chronic kidney disease: Secondary | ICD-10-CM | POA: Diagnosis not present

## 2018-05-16 DIAGNOSIS — E0822 Diabetes mellitus due to underlying condition with diabetic chronic kidney disease: Secondary | ICD-10-CM | POA: Diagnosis not present

## 2018-05-17 DIAGNOSIS — E0822 Diabetes mellitus due to underlying condition with diabetic chronic kidney disease: Secondary | ICD-10-CM | POA: Diagnosis not present

## 2018-05-18 DIAGNOSIS — E0822 Diabetes mellitus due to underlying condition with diabetic chronic kidney disease: Secondary | ICD-10-CM | POA: Diagnosis not present

## 2018-05-19 DIAGNOSIS — E0822 Diabetes mellitus due to underlying condition with diabetic chronic kidney disease: Secondary | ICD-10-CM | POA: Diagnosis not present

## 2018-05-20 DIAGNOSIS — E0822 Diabetes mellitus due to underlying condition with diabetic chronic kidney disease: Secondary | ICD-10-CM | POA: Diagnosis not present

## 2018-05-21 DIAGNOSIS — E0822 Diabetes mellitus due to underlying condition with diabetic chronic kidney disease: Secondary | ICD-10-CM | POA: Diagnosis not present

## 2018-05-22 DIAGNOSIS — E0822 Diabetes mellitus due to underlying condition with diabetic chronic kidney disease: Secondary | ICD-10-CM | POA: Diagnosis not present

## 2018-05-23 DIAGNOSIS — E0822 Diabetes mellitus due to underlying condition with diabetic chronic kidney disease: Secondary | ICD-10-CM | POA: Diagnosis not present

## 2018-05-23 DIAGNOSIS — I10 Essential (primary) hypertension: Secondary | ICD-10-CM | POA: Diagnosis not present

## 2018-05-23 DIAGNOSIS — J45909 Unspecified asthma, uncomplicated: Secondary | ICD-10-CM | POA: Diagnosis not present

## 2018-05-23 DIAGNOSIS — E119 Type 2 diabetes mellitus without complications: Secondary | ICD-10-CM | POA: Diagnosis not present

## 2018-05-23 DIAGNOSIS — K219 Gastro-esophageal reflux disease without esophagitis: Secondary | ICD-10-CM | POA: Diagnosis not present

## 2018-05-23 DIAGNOSIS — E785 Hyperlipidemia, unspecified: Secondary | ICD-10-CM | POA: Diagnosis not present

## 2018-05-23 DIAGNOSIS — R945 Abnormal results of liver function studies: Secondary | ICD-10-CM | POA: Diagnosis not present

## 2018-05-23 DIAGNOSIS — F419 Anxiety disorder, unspecified: Secondary | ICD-10-CM | POA: Diagnosis not present

## 2018-05-23 DIAGNOSIS — Z79899 Other long term (current) drug therapy: Secondary | ICD-10-CM | POA: Diagnosis not present

## 2018-05-23 DIAGNOSIS — Z6828 Body mass index (BMI) 28.0-28.9, adult: Secondary | ICD-10-CM | POA: Diagnosis not present

## 2018-05-24 DIAGNOSIS — E0822 Diabetes mellitus due to underlying condition with diabetic chronic kidney disease: Secondary | ICD-10-CM | POA: Diagnosis not present

## 2018-05-25 DIAGNOSIS — E0822 Diabetes mellitus due to underlying condition with diabetic chronic kidney disease: Secondary | ICD-10-CM | POA: Diagnosis not present

## 2018-05-26 DIAGNOSIS — E0822 Diabetes mellitus due to underlying condition with diabetic chronic kidney disease: Secondary | ICD-10-CM | POA: Diagnosis not present

## 2018-05-27 DIAGNOSIS — E0822 Diabetes mellitus due to underlying condition with diabetic chronic kidney disease: Secondary | ICD-10-CM | POA: Diagnosis not present

## 2018-06-04 DIAGNOSIS — E0822 Diabetes mellitus due to underlying condition with diabetic chronic kidney disease: Secondary | ICD-10-CM | POA: Diagnosis not present

## 2018-06-05 DIAGNOSIS — E0822 Diabetes mellitus due to underlying condition with diabetic chronic kidney disease: Secondary | ICD-10-CM | POA: Diagnosis not present

## 2018-06-06 DIAGNOSIS — E0822 Diabetes mellitus due to underlying condition with diabetic chronic kidney disease: Secondary | ICD-10-CM | POA: Diagnosis not present

## 2018-06-07 DIAGNOSIS — E0822 Diabetes mellitus due to underlying condition with diabetic chronic kidney disease: Secondary | ICD-10-CM | POA: Diagnosis not present

## 2018-06-08 DIAGNOSIS — E0822 Diabetes mellitus due to underlying condition with diabetic chronic kidney disease: Secondary | ICD-10-CM | POA: Diagnosis not present

## 2018-06-09 DIAGNOSIS — E0822 Diabetes mellitus due to underlying condition with diabetic chronic kidney disease: Secondary | ICD-10-CM | POA: Diagnosis not present

## 2018-06-10 DIAGNOSIS — E0822 Diabetes mellitus due to underlying condition with diabetic chronic kidney disease: Secondary | ICD-10-CM | POA: Diagnosis not present

## 2018-06-11 DIAGNOSIS — E0822 Diabetes mellitus due to underlying condition with diabetic chronic kidney disease: Secondary | ICD-10-CM | POA: Diagnosis not present

## 2018-06-12 ENCOUNTER — Ambulatory Visit: Payer: Medicare HMO | Admitting: Neurology

## 2018-06-12 DIAGNOSIS — E0822 Diabetes mellitus due to underlying condition with diabetic chronic kidney disease: Secondary | ICD-10-CM | POA: Diagnosis not present

## 2018-06-13 DIAGNOSIS — E0822 Diabetes mellitus due to underlying condition with diabetic chronic kidney disease: Secondary | ICD-10-CM | POA: Diagnosis not present

## 2018-06-14 DIAGNOSIS — E0822 Diabetes mellitus due to underlying condition with diabetic chronic kidney disease: Secondary | ICD-10-CM | POA: Diagnosis not present

## 2018-06-15 DIAGNOSIS — E0822 Diabetes mellitus due to underlying condition with diabetic chronic kidney disease: Secondary | ICD-10-CM | POA: Diagnosis not present

## 2018-06-16 DIAGNOSIS — E0822 Diabetes mellitus due to underlying condition with diabetic chronic kidney disease: Secondary | ICD-10-CM | POA: Diagnosis not present

## 2018-06-17 DIAGNOSIS — E0822 Diabetes mellitus due to underlying condition with diabetic chronic kidney disease: Secondary | ICD-10-CM | POA: Diagnosis not present

## 2018-06-18 DIAGNOSIS — E0822 Diabetes mellitus due to underlying condition with diabetic chronic kidney disease: Secondary | ICD-10-CM | POA: Diagnosis not present

## 2018-06-19 DIAGNOSIS — J45909 Unspecified asthma, uncomplicated: Secondary | ICD-10-CM | POA: Diagnosis not present

## 2018-06-19 DIAGNOSIS — E119 Type 2 diabetes mellitus without complications: Secondary | ICD-10-CM | POA: Diagnosis not present

## 2018-06-19 DIAGNOSIS — Z6828 Body mass index (BMI) 28.0-28.9, adult: Secondary | ICD-10-CM | POA: Diagnosis not present

## 2018-06-19 DIAGNOSIS — M1712 Unilateral primary osteoarthritis, left knee: Secondary | ICD-10-CM | POA: Diagnosis not present

## 2018-06-19 DIAGNOSIS — E785 Hyperlipidemia, unspecified: Secondary | ICD-10-CM | POA: Diagnosis not present

## 2018-06-19 DIAGNOSIS — Z79899 Other long term (current) drug therapy: Secondary | ICD-10-CM | POA: Diagnosis not present

## 2018-06-19 DIAGNOSIS — I10 Essential (primary) hypertension: Secondary | ICD-10-CM | POA: Diagnosis not present

## 2018-06-19 DIAGNOSIS — E0822 Diabetes mellitus due to underlying condition with diabetic chronic kidney disease: Secondary | ICD-10-CM | POA: Diagnosis not present

## 2018-06-20 DIAGNOSIS — E0822 Diabetes mellitus due to underlying condition with diabetic chronic kidney disease: Secondary | ICD-10-CM | POA: Diagnosis not present

## 2018-06-21 DIAGNOSIS — E0822 Diabetes mellitus due to underlying condition with diabetic chronic kidney disease: Secondary | ICD-10-CM | POA: Diagnosis not present

## 2018-06-22 DIAGNOSIS — E0822 Diabetes mellitus due to underlying condition with diabetic chronic kidney disease: Secondary | ICD-10-CM | POA: Diagnosis not present

## 2018-06-23 DIAGNOSIS — E0822 Diabetes mellitus due to underlying condition with diabetic chronic kidney disease: Secondary | ICD-10-CM | POA: Diagnosis not present

## 2018-06-24 DIAGNOSIS — E0822 Diabetes mellitus due to underlying condition with diabetic chronic kidney disease: Secondary | ICD-10-CM | POA: Diagnosis not present

## 2018-06-25 DIAGNOSIS — E0822 Diabetes mellitus due to underlying condition with diabetic chronic kidney disease: Secondary | ICD-10-CM | POA: Diagnosis not present

## 2018-06-26 DIAGNOSIS — E0822 Diabetes mellitus due to underlying condition with diabetic chronic kidney disease: Secondary | ICD-10-CM | POA: Diagnosis not present

## 2018-06-27 ENCOUNTER — Encounter: Payer: Self-pay | Admitting: Neurology

## 2018-06-27 DIAGNOSIS — E0822 Diabetes mellitus due to underlying condition with diabetic chronic kidney disease: Secondary | ICD-10-CM | POA: Diagnosis not present

## 2018-06-28 ENCOUNTER — Encounter: Payer: Self-pay | Admitting: Cardiology

## 2018-06-28 ENCOUNTER — Ambulatory Visit (INDEPENDENT_AMBULATORY_CARE_PROVIDER_SITE_OTHER): Payer: Medicare HMO | Admitting: Cardiology

## 2018-06-28 VITALS — BP 118/62 | HR 75 | Ht 65.5 in | Wt 171.0 lb

## 2018-06-28 DIAGNOSIS — E782 Mixed hyperlipidemia: Secondary | ICD-10-CM | POA: Diagnosis not present

## 2018-06-28 DIAGNOSIS — E0822 Diabetes mellitus due to underlying condition with diabetic chronic kidney disease: Secondary | ICD-10-CM | POA: Diagnosis not present

## 2018-06-28 DIAGNOSIS — I10 Essential (primary) hypertension: Secondary | ICD-10-CM | POA: Diagnosis not present

## 2018-06-28 DIAGNOSIS — E088 Diabetes mellitus due to underlying condition with unspecified complications: Secondary | ICD-10-CM

## 2018-06-28 NOTE — Patient Instructions (Signed)
Medication Instructions:  Your physician recommends that you continue on your current medications as directed. Please refer to the Current Medication list given to you today.  If you need a refill on your cardiac medications before your next appointment, please call your pharmacy.   Lab work: None If you have labs (blood work) drawn today and your tests are completely normal, you will receive your results only by: . MyChart Message (if you have MyChart) OR . A paper copy in the mail If you have any lab test that is abnormal or we need to change your treatment, we will call you to review the results.  Testing/Procedures: None  Follow-Up: At [facility], you and your health needs are our priority.  As part of our continuing mission to provide you with exceptional heart care, we have created designated Provider Care Teams.  These Care Teams include your primary Cardiologist (physician) and Advanced Practice Providers (APPs -  Physician Assistants and Nurse Practitioners) who all work together to provide you with the care you need, when you need it. You will need a follow up appointment in 6 months.  Please call our office 2 months in advance to schedule this appointment.  You may see No primary care provider on file. or another member of our [facility] Provider Team in [place]: Robert Krasowski, MD . Brian Munley, MD  Any Other Special Instructions Will Be Listed Below (If Applicable).    

## 2018-06-28 NOTE — Progress Notes (Signed)
Cardiology Office Note:    Date:  06/28/2018   ID:  Deborah Hutchinson, DOB 1946/01/09, MRN [id number]  PCP:  [name], MD  Cardiologist:  [name], MD   Referring MD: [name], MD    ASSESSMENT:    1. Essential hypertension   2. Diabetes mellitus due to underlying condition with unspecified complications ([place])   3. Mixed dyslipidemia    PLAN:    In order of problems listed above:  1. I discussed findings of the CT coronary angiography with the patient at extensive length.  Details are mentioned below.  Primary prevention stressed to the patient.  Importance of compliance with diet and medication stressed.  She had recent blood work and lipids are.  As her other conditions permit ambulation was encouraged 2. Patient will be seen in follow-up appointment in 6 months or earlier if the patient has any concerns    Medication Adjustments/Labs and Tests Ordered: Current medicines are reviewed at length with the patient today.  Concerns regarding medicines are outlined above.  No orders of the defined types were placed in this encounter.  No orders of the defined types were placed in this encounter.    No chief complaint on file.    History of Present Illness:    Deborah Hutchinson is a 73 y.o. female.  Patient has history of essential hypertension dyslipidemia and diabetes mellitus.  She denies any problems at this time and takes care of activities of daily living.  No chest pain orthopnea or PND.  She is having issues with ambulation because of nerve problems.  This is handled by her primary care physician.  Past Medical History:  Diagnosis Date  . Angina    normal stress test  . Asthma   . Bronchitis   . 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 Diabetes mellitus   . Diabetes mellitus due to underlying condition with unspecified complications ([place]) 06/15/8339  . Dyslipidemia 06/10/2015  . Essential  hypertension 06/10/2015  . GERD (gastroesophageal reflux disease)   . H/O hiatal hernia   . Heart murmur   . Hyperlipemia   . Hypertension   . Nonspecific (abnormal) findings on radiological and other examination of gastrointestinal tract 05/19/2011  . Pain and swelling of toe of right foot 07/15/2016  . Palpitations 06/02/2016  . Shortness of breath    on  excertion    Past Surgical History:  Procedure Laterality Date  . ABDOMINAL HYSTERECTOMY    . ABDOMINAL HYSTERECTOMY    . BREAST SURGERY     begin mass,left  breast  . COLONOSCOPY  08/17/2015   Colonic polyp status post polypectomy. Mild pancolonic diverticulosis. Highly redundant colon.   Marland Kitchen DILATION AND CURETTAGE OF UTERUS     one  . ESOPHAGOGASTRODUODENOSCOPY  08/17/2015   Moderate hiatal hernia. Otherwise noraml EGD.   . EUS  05/19/2011   Procedure: UPPER ENDOSCOPIC ULTRASOUND (EUS) LINEAR;  Surgeon: Owens Loffler, MD;  Location: [facility];  Service: Endoscopy;  Laterality: N/A;  radial linear   . HYSTEROTOMY    . KNEE SURGERY    . LEFT HEART CATH AND CORONARY ANGIOGRAPHY    . right thumb     tendon repaire  . SHOULDER SURGERY     rotator cuff repair  . WRIST SURGERY     rigth    Current Medications: Current Meds  Medication Sig  . albuterol (PROVENTIL HFA;VENTOLIN HFA) 108 (90 Base) MCG/ACT  inhaler Inhale 2 puffs into the lungs every 6 (six) hours as needed for wheezing or shortness of breath.  . Albuterol (VENTOLIN IN) Inhale 2 puffs into the lungs every 6 (six) hours as needed (wheezing).   . ALPRAZolam (XANAX) 0.5 MG tablet Take 1 tablet (0.5 mg total) by mouth as needed for anxiety.  Marland Kitchen amLODipine (NORVASC) 10 MG tablet Take 10 mg by mouth daily.  Marland Kitchen aspirin 81 MG tablet Take 81 mg by mouth daily.   . BD PEN NEEDLE NANO U/F 32G X 4 MM MISC   . budesonide-formoterol (SYMBICORT) 160-4.5 MCG/ACT inhaler Inhale 2 puffs into the lungs 2 (two) times daily.  Marland Kitchen buPROPion (WELLBUTRIN XL) 150 MG 24 hr tablet Take 150 mg by  mouth daily.  . Cholecalciferol (VITAMIN D PO) Take 5,000 Units by mouth 2 (two) times daily.   Marland Kitchen EPIPEN 2-PAK 0.3 MG/0.3ML SOAJ injection   . escitalopram (LEXAPRO) 10 MG tablet Take 10 mg by mouth daily.  . famotidine (PEPCID) 40 MG tablet Take 1 tablet (40 mg total) by mouth 2 (two) times daily.  . fluticasone (VERAMYST) 27.5 MCG/SPRAY nasal spray Place 2 sprays into the nose daily.  . hydrALAZINE (APRESOLINE) 25 MG tablet Take 37.5 mg by mouth 3 (three) times daily.  . hydrocortisone (ANUSOL-HC) 2.5 % rectal cream Place 1 application rectally 2 (two) times daily.  . isosorbide mononitrate (IMDUR) 60 MG 24 hr tablet Take 60 mg by mouth daily.  . Lancets (ACCU-CHEK MULTICLIX) lancets   . losartan-hydrochlorothiazide (HYZAAR) 100-25 MG tablet Take 1 tablet by mouth daily.  . metFORMIN (GLUCOPHAGE) 500 MG tablet Take 500 mg by mouth 2 (two) times daily with a meal.   . montelukast (SINGULAIR) 10 MG tablet Take 10 mg by mouth daily.  . Multiple Vitamin (MULTIVITAMIN) capsule Take 1 capsule by mouth daily.    . nitroGLYCERIN (NITROSTAT) 0.4 MG SL tablet Place 1 tablet (0.4 mg total) under the tongue every 5 (five) minutes x 3 doses as needed for chest pain.  Marland Kitchen omeprazole (PRILOSEC) 20 MG capsule Take 20 mg by mouth 2 (two) times daily before a meal.   . traMADol (ULTRAM) 50 MG tablet Take 50 mg by mouth 2 (two) times daily as needed for pain.  Marland Kitchen VICTOZA 18 MG/3ML SOPN 12 mg daily.      Allergies:   Penicillins   Social History   Socioeconomic History  . Marital status: Single    Spouse name: Not on file  . Number of children: Not on file  . Years of education: Not on file  . Highest education level: Not on file  Occupational History  . Not on file  Social Needs  . Financial resource strain: Not on file  . Food insecurity:    Worry: Not on file    Inability: Not on file  . Transportation needs:    Medical: Not on file    Non-medical: Not on file  Tobacco Use  . Smoking status:  Former Smoker    Packs/day: 1.00    Last attempt to quit: 05/17/2011    Years since quitting: 7.1  . Smokeless tobacco: Never Used  Substance and Sexual Activity  . Alcohol use: No  . Drug use: No  . Sexual activity: Never  Lifestyle  . Physical activity:    Days per week: Not on file    Minutes per session: Not on file  . Stress: Not on file  Relationships  . Social connections:    Talks  on phone: Not on file    Gets together: Not on file    Attends religious service: Not on file    Active member of club or organization: Not on file    Attends meetings of clubs or organizations: Not on file    Relationship status: Not on file  Other Topics Concern  . Not on file  Social History Narrative  . Not on file     Family History: The patient's family history includes Diabetes in her sister; Hypertension in her daughter.  ROS:   Please see the history of present illness.    All other systems reviewed and are negative.  EKGs/Labs/Other Studies Reviewed:    The following studies were reviewed today:  Status Exam Begun  Exam Ended   Final [99] 01/09/2018 10:54 AM 01/09/2018 5:19 PM  Study Result   EXAM: CT FFR ANALYSIS  CLINICAL DATA:  73 year old female with DOE.  FINDINGS: FFRct analysis was performed on the original cardiac CT angiogram dataset. Diagrammatic representation of the FFRct analysis is provided in a separate PDF document in PACS. This dictation was created using the PDF document and an interactive 3D model of the results. 3D model is not available in the EMR/PACS. Normal FFR range is >0.80.  1. Left Main:  No significant stenosis.  2. LAD: No significant stenosis. 3. LCX: No significant stenosis. 4. RCA: No significant stenosis.  IMPRESSION: 1.  CT FFR analysis didn't show any significant stenosis.   Electronically Signed   By: Ena Dawley   On: 01/10/2018 16:25      Recent Labs: 02/02/2018: BUN 14; Creat 0.80; Hemoglobin 12.6;  Platelets 435; Potassium 4.5; Sodium 140 03/06/2018: ALT 18  Recent Lipid Panel    Component Value Date/Time   CHOL 169 03/06/2018 1510   TRIG 101 03/06/2018 1510   HDL 88 03/06/2018 1510   CHOLHDL 1.9 03/06/2018 1510   LDLCALC 61 03/06/2018 1510    Physical Exam:    VS:  BP 118/62 (BP Location: Right Arm, Patient Position: Sitting, Cuff Size: Normal)   Pulse 75   Ht 5' 5.5" (1.664 m)   Wt 171 lb (77.6 kg)   SpO2 98%   BMI 28.02 kg/m     Wt Readings from Last 3 Encounters:  06/28/18 171 lb (77.6 kg)  02/02/18 171 lb 2 oz (77.6 kg)  12/15/17 169 lb (76.7 kg)     GEN: Patient is in no acute distress HEENT: Normal NECK: No JVD; No carotid bruits LYMPHATICS: No lymphadenopathy CARDIAC: Hear sounds regular, 2/6 systolic murmur at the apex. RESPIRATORY:  Clear to auscultation without rales, wheezing or rhonchi  ABDOMEN: Soft, non-tender, non-distended MUSCULOSKELETAL:  No edema; No deformity  SKIN: Warm and dry NEUROLOGIC:  Alert and oriented x 3 PSYCHIATRIC:  Normal affect   Signed, [name], MD  06/28/2018 10:22 AM    [facility]

## 2018-06-29 DIAGNOSIS — E0822 Diabetes mellitus due to underlying condition with diabetic chronic kidney disease: Secondary | ICD-10-CM | POA: Diagnosis not present

## 2018-06-30 DIAGNOSIS — E0822 Diabetes mellitus due to underlying condition with diabetic chronic kidney disease: Secondary | ICD-10-CM | POA: Diagnosis not present

## 2018-07-01 DIAGNOSIS — E0822 Diabetes mellitus due to underlying condition with diabetic chronic kidney disease: Secondary | ICD-10-CM | POA: Diagnosis not present

## 2018-07-02 DIAGNOSIS — D509 Iron deficiency anemia, unspecified: Secondary | ICD-10-CM | POA: Diagnosis not present

## 2018-07-02 DIAGNOSIS — E0822 Diabetes mellitus due to underlying condition with diabetic chronic kidney disease: Secondary | ICD-10-CM | POA: Diagnosis not present

## 2018-07-03 DIAGNOSIS — E0822 Diabetes mellitus due to underlying condition with diabetic chronic kidney disease: Secondary | ICD-10-CM | POA: Diagnosis not present

## 2018-07-04 DIAGNOSIS — E0822 Diabetes mellitus due to underlying condition with diabetic chronic kidney disease: Secondary | ICD-10-CM | POA: Diagnosis not present

## 2018-07-05 DIAGNOSIS — E0822 Diabetes mellitus due to underlying condition with diabetic chronic kidney disease: Secondary | ICD-10-CM | POA: Diagnosis not present

## 2018-07-06 DIAGNOSIS — E0822 Diabetes mellitus due to underlying condition with diabetic chronic kidney disease: Secondary | ICD-10-CM | POA: Diagnosis not present

## 2018-07-07 DIAGNOSIS — E0822 Diabetes mellitus due to underlying condition with diabetic chronic kidney disease: Secondary | ICD-10-CM | POA: Diagnosis not present

## 2018-07-08 DIAGNOSIS — E0822 Diabetes mellitus due to underlying condition with diabetic chronic kidney disease: Secondary | ICD-10-CM | POA: Diagnosis not present

## 2018-07-09 DIAGNOSIS — E0822 Diabetes mellitus due to underlying condition with diabetic chronic kidney disease: Secondary | ICD-10-CM | POA: Diagnosis not present

## 2018-07-10 DIAGNOSIS — E0822 Diabetes mellitus due to underlying condition with diabetic chronic kidney disease: Secondary | ICD-10-CM | POA: Diagnosis not present

## 2018-07-11 DIAGNOSIS — E0822 Diabetes mellitus due to underlying condition with diabetic chronic kidney disease: Secondary | ICD-10-CM | POA: Diagnosis not present

## 2018-07-12 DIAGNOSIS — E0822 Diabetes mellitus due to underlying condition with diabetic chronic kidney disease: Secondary | ICD-10-CM | POA: Diagnosis not present

## 2018-07-13 DIAGNOSIS — E0822 Diabetes mellitus due to underlying condition with diabetic chronic kidney disease: Secondary | ICD-10-CM | POA: Diagnosis not present

## 2018-07-14 DIAGNOSIS — E0822 Diabetes mellitus due to underlying condition with diabetic chronic kidney disease: Secondary | ICD-10-CM | POA: Diagnosis not present

## 2018-07-15 DIAGNOSIS — E0822 Diabetes mellitus due to underlying condition with diabetic chronic kidney disease: Secondary | ICD-10-CM | POA: Diagnosis not present

## 2018-07-16 DIAGNOSIS — E0822 Diabetes mellitus due to underlying condition with diabetic chronic kidney disease: Secondary | ICD-10-CM | POA: Diagnosis not present

## 2018-07-17 DIAGNOSIS — E0822 Diabetes mellitus due to underlying condition with diabetic chronic kidney disease: Secondary | ICD-10-CM | POA: Diagnosis not present

## 2018-07-18 DIAGNOSIS — I10 Essential (primary) hypertension: Secondary | ICD-10-CM | POA: Diagnosis not present

## 2018-07-18 DIAGNOSIS — E0822 Diabetes mellitus due to underlying condition with diabetic chronic kidney disease: Secondary | ICD-10-CM | POA: Diagnosis not present

## 2018-07-18 DIAGNOSIS — R079 Chest pain, unspecified: Secondary | ICD-10-CM | POA: Diagnosis not present

## 2018-07-18 DIAGNOSIS — Z6828 Body mass index (BMI) 28.0-28.9, adult: Secondary | ICD-10-CM | POA: Diagnosis not present

## 2018-07-18 DIAGNOSIS — M940 Chondrocostal junction syndrome [Tietze]: Secondary | ICD-10-CM | POA: Diagnosis not present

## 2018-07-19 DIAGNOSIS — E0822 Diabetes mellitus due to underlying condition with diabetic chronic kidney disease: Secondary | ICD-10-CM | POA: Diagnosis not present

## 2018-07-20 DIAGNOSIS — E0822 Diabetes mellitus due to underlying condition with diabetic chronic kidney disease: Secondary | ICD-10-CM | POA: Diagnosis not present

## 2018-07-21 DIAGNOSIS — E0822 Diabetes mellitus due to underlying condition with diabetic chronic kidney disease: Secondary | ICD-10-CM | POA: Diagnosis not present

## 2018-07-22 DIAGNOSIS — E0822 Diabetes mellitus due to underlying condition with diabetic chronic kidney disease: Secondary | ICD-10-CM | POA: Diagnosis not present

## 2018-07-23 DIAGNOSIS — E0822 Diabetes mellitus due to underlying condition with diabetic chronic kidney disease: Secondary | ICD-10-CM | POA: Diagnosis not present

## 2018-07-24 DIAGNOSIS — E0822 Diabetes mellitus due to underlying condition with diabetic chronic kidney disease: Secondary | ICD-10-CM | POA: Diagnosis not present

## 2018-07-25 DIAGNOSIS — E0822 Diabetes mellitus due to underlying condition with diabetic chronic kidney disease: Secondary | ICD-10-CM | POA: Diagnosis not present

## 2018-07-26 DIAGNOSIS — E0822 Diabetes mellitus due to underlying condition with diabetic chronic kidney disease: Secondary | ICD-10-CM | POA: Diagnosis not present

## 2018-07-27 DIAGNOSIS — E0822 Diabetes mellitus due to underlying condition with diabetic chronic kidney disease: Secondary | ICD-10-CM | POA: Diagnosis not present

## 2018-07-28 DIAGNOSIS — E0822 Diabetes mellitus due to underlying condition with diabetic chronic kidney disease: Secondary | ICD-10-CM | POA: Diagnosis not present

## 2018-07-29 DIAGNOSIS — E0822 Diabetes mellitus due to underlying condition with diabetic chronic kidney disease: Secondary | ICD-10-CM | POA: Diagnosis not present

## 2018-08-01 ENCOUNTER — Ambulatory Visit: Payer: Medicare HMO | Admitting: Neurology

## 2018-08-01 DIAGNOSIS — K219 Gastro-esophageal reflux disease without esophagitis: Secondary | ICD-10-CM | POA: Diagnosis not present

## 2018-08-01 DIAGNOSIS — R05 Cough: Secondary | ICD-10-CM | POA: Diagnosis not present

## 2018-08-01 DIAGNOSIS — J45909 Unspecified asthma, uncomplicated: Secondary | ICD-10-CM | POA: Diagnosis not present

## 2018-08-01 DIAGNOSIS — J309 Allergic rhinitis, unspecified: Secondary | ICD-10-CM | POA: Diagnosis not present

## 2018-08-02 ENCOUNTER — Ambulatory Visit: Payer: Medicare HMO | Admitting: Neurology

## 2018-09-07 ENCOUNTER — Other Ambulatory Visit: Payer: Self-pay | Admitting: Cardiology

## 2018-09-10 NOTE — Telephone Encounter (Signed)
Rx refill sent to pharmacy. 

## 2018-09-20 ENCOUNTER — Telehealth: Payer: Self-pay | Admitting: Cardiology

## 2018-09-20 NOTE — Telephone Encounter (Signed)
Virtual Visit Pre-Appointment Phone Call  "(Name), I am calling you today to discuss your upcoming appointment. We are currently trying to limit exposure to the virus that causes COVID-19 by seeing patients at home rather than in the office."  1. "What is the BEST phone number to call the day of the visit?" - include this in appointment notes  2. Do you have or have access to (through a family member/friend) a smartphone with video capability that we can use for your visit?" a. If yes - list this number in appt notes as cell (if different from BEST phone #) and list the appointment type as a VIDEO visit in appointment notes b. If no - list the appointment type as a PHONE visit in appointment notes  3. Confirm consent - "In the setting of the current Covid19 crisis, you are scheduled for a (phone or video) visit with your provider on (date) at (time).  Just as we do with many in-office visits, in order for you to participate in this visit, we must obtain consent.  If you'd like, I can send this to your mychart (if signed up) or email for you to review.  Otherwise, I can obtain your verbal consent now.  All virtual visits are billed to your insurance company just like a normal visit would be.  By agreeing to a virtual visit, we'd like you to understand that the technology does not allow for your provider to perform an examination, and thus may limit your provider's ability to fully assess your condition. If your provider identifies any concerns that need to be evaluated in person, we will make arrangements to do so.  Finally, though the technology is pretty good, we cannot assure that it will always work on either your or our end, and in the setting of a video visit, we may have to convert it to a phone-only visit.  In either situation, we cannot ensure that we have a secure connection.  Are you willing to proceed?" STAFF: Did the patient verbally acknowledge consent to telehealth visit? Document  YES/NO here: Yes  4. Advise patient to be prepared - "Two hours prior to your appointment, go ahead and check your blood pressure, pulse, oxygen saturation, and your weight (if you have the equipment to check those) and write them all down. When your visit starts, your provider will ask you for this information. If you have an Apple Watch or Kardia device, please plan to have heart rate information ready on the day of your appointment. Please have a pen and paper handy nearby the day of the visit as well."  5. Give patient instructions for MyChart download to smartphone OR Doximity/Doxy.me as below if video visit (depending on what platform provider is using)  6. Inform patient they will receive a phone call 15 minutes prior to their appointment time (may be from unknown caller ID) so they should be prepared to answer    Deborah Hutchinson has been deemed a candidate for a follow-up tele-health visit to limit community exposure during the Covid-19 pandemic. I spoke with the patient via phone to ensure availability of phone/video source, confirm preferred email & phone number, and discuss instructions and expectations.  I reminded Deborah Hutchinson to be prepared with any vital sign and/or heart rhythm information that could potentially be obtained via home monitoring, at the time of her visit. I reminded Deborah Hutchinson to expect a phone call prior to  her visit.  Calla Kicks 09/20/2018 9:45 AM   INSTRUCTIONS FOR DOWNLOADING THE MYCHART APP TO SMARTPHONE  - The patient must first make sure to have activated MyChart and know their login information - If Apple, go to [employer] and type in MyChart in the search bar and download the app. If Android, ask patient to go to [employer] and type in [place] in the search bar and download the app. The app is free but as with any other app downloads, their phone may require them to verify saved payment information or Apple/Android  password.  - The patient will need to then log into the app with their MyChart username and password, and select [facility] as their healthcare provider to link the account. When it is time for your visit, go to the MyChart app, find appointments, and click Begin Video Visit. Be sure to Select Allow for your device to access the Microphone and Camera for your visit. You will then be connected, and your provider will be with you shortly.  **If they have any issues connecting, or need assistance please contact MyChart service desk [phone])**  **If using a computer, in order to ensure the best quality for their visit they will need to use either of the following Internet Browsers: [employer], or Google Chrome**  IF USING DOXIMITY or DOXY.ME - The patient will receive a link just prior to their visit by text.     FULL LENGTH CONSENT FOR TELE-HEALTH VISIT   I hereby voluntarily request, consent and authorize [facility] and its employed or contracted physicians, physician assistants, nurse practitioners or other licensed health care professionals (the Practitioner), to provide me with telemedicine health care services (the Services") as deemed necessary by the treating Practitioner. I acknowledge and consent to receive the Services by the Practitioner via telemedicine. I understand that the telemedicine visit will involve communicating with the Practitioner through live audiovisual communication technology and the disclosure of certain medical information by electronic transmission. I acknowledge that I have been given the opportunity to request an in-person assessment or other available alternative prior to the telemedicine visit and am voluntarily participating in the telemedicine visit.  I understand that I have the right to withhold or withdraw my consent to the use of telemedicine in the course of my care at any time, without affecting my right to future care or treatment,  and that the Practitioner or I may terminate the telemedicine visit at any time. I understand that I have the right to inspect all information obtained and/or recorded in the course of the telemedicine visit and may receive copies of available information for a reasonable fee.  I understand that some of the potential risks of receiving the Services via telemedicine include:   Delay or interruption in medical evaluation due to technological equipment failure or disruption;  Information transmitted may not be sufficient (e.g. poor resolution of images) to allow for appropriate medical decision making by the Practitioner; and/or   In rare instances, security protocols could fail, causing a breach of personal health information.  Furthermore, I acknowledge that it is my responsibility to provide information about my medical history, conditions and care that is complete and accurate to the best of my ability. I acknowledge that Practitioner's advice, recommendations, and/or decision may be based on factors not within their control, such as incomplete or inaccurate data provided by me or distortions of diagnostic images or specimens that may result from electronic transmissions. I  understand that the practice of medicine is not an exact science and that Practitioner makes no warranties or guarantees regarding treatment outcomes. I acknowledge that I will receive a copy of this consent concurrently upon execution via email to the email address I last provided but may also request a printed copy by calling the office of [facility].    I understand that my insurance will be billed for this visit.   I have read or had this consent read to me.  I understand the contents of this consent, which adequately explains the benefits and risks of the Services being provided via telemedicine.   I have been provided ample opportunity to ask questions regarding this consent and the Services and have had my questions  answered to my satisfaction.  I give my informed consent for the services to be provided through the use of telemedicine in my medical care  By participating in this telemedicine visit I agree to the above.

## 2018-09-21 ENCOUNTER — Encounter: Payer: Self-pay | Admitting: Cardiology

## 2018-09-21 ENCOUNTER — Telehealth: Payer: Self-pay | Admitting: Pulmonary Disease

## 2018-09-21 ENCOUNTER — Other Ambulatory Visit: Payer: Self-pay

## 2018-09-21 ENCOUNTER — Encounter: Payer: Self-pay | Admitting: Acute Care

## 2018-09-21 ENCOUNTER — Ambulatory Visit (INDEPENDENT_AMBULATORY_CARE_PROVIDER_SITE_OTHER): Payer: Medicare HMO | Admitting: Acute Care

## 2018-09-21 ENCOUNTER — Telehealth (INDEPENDENT_AMBULATORY_CARE_PROVIDER_SITE_OTHER): Payer: Medicare HMO | Admitting: Cardiology

## 2018-09-21 VITALS — BP 152/90 | HR 80 | Ht 65.5 in | Wt 169.0 lb

## 2018-09-21 DIAGNOSIS — J45909 Unspecified asthma, uncomplicated: Secondary | ICD-10-CM

## 2018-09-21 DIAGNOSIS — I10 Essential (primary) hypertension: Secondary | ICD-10-CM

## 2018-09-21 DIAGNOSIS — E782 Mixed hyperlipidemia: Secondary | ICD-10-CM

## 2018-09-21 DIAGNOSIS — E088 Diabetes mellitus due to underlying condition with unspecified complications: Secondary | ICD-10-CM

## 2018-09-21 DIAGNOSIS — R0600 Dyspnea, unspecified: Secondary | ICD-10-CM

## 2018-09-21 DIAGNOSIS — I350 Nonrheumatic aortic (valve) stenosis: Secondary | ICD-10-CM

## 2018-09-21 DIAGNOSIS — R0789 Other chest pain: Secondary | ICD-10-CM

## 2018-09-21 DIAGNOSIS — E119 Type 2 diabetes mellitus without complications: Secondary | ICD-10-CM

## 2018-09-21 NOTE — Addendum Note (Signed)
Addended by: Particia Nearing B on: 09/21/2018 10:12 AM   Modules accepted: Orders

## 2018-09-21 NOTE — Patient Instructions (Addendum)
It was nice talking with you today  Remember to take your medications correctly. Take Symbicort 2 puffs twice a day no matter how you feel Continue Spiriva 2 puffs once daily no matter how you feel Rinse mouth/ brush teeth  after inhaler use Use Ventolin  as needed for chest tightness wheezing or shortness of breath. You can use 2 puffs as needed every 4-6 hours  Continue your Montelukast 10 mg  daily as you have been doing. Continue your Fluticasone nasal spray daily as you have been doing Practice good hand hygiene Exercise regularly We will schedule a PFT once the office re-starts testing We will see you in the office 09/24/2018 at 3:30 for CXR and Ambulatory saturation test to ensure you are not dropping your oxygen levels. If shortness of breath worsens, seek emergency care. Please contact office for sooner follow up if symptoms do not improve or worsen or seek emergency care   Chest pain Per cardiology recommendations made today on your teleconference with Dr. Lyndel Safe  Continue to practice social distancing,  wash hand frequently, and wear a face mask if you leave your home. Remember to utilize the early hours at grocery stores/ drug stores  for people > 57 years old or any underlying health risks, as the stores are cleaner and less crowded at these times.

## 2018-09-21 NOTE — Progress Notes (Signed)
Virtual Visit via Video Note   This visit type was conducted due to national recommendations for restrictions regarding the COVID-19 Pandemic (e.g. social distancing) in an effort to limit this patient's exposure and mitigate transmission in our community.  Due to her co-morbid illnesses, this patient is at least at moderate risk for complications without adequate follow up.  This format is felt to be most appropriate for this patient at this time.  All issues noted in this document were discussed and addressed.  A limited physical exam was performed with this format.  Please refer to the patient's chart for her consent to telehealth for [facility].   Date:  09/21/2018   ID:  Deborah Hutchinson, DOB [id number], MRN [id number]  Patient Location: Home Provider Location: Office  PCP:  [name], MD  Cardiologist:  No primary care provider on file.  Electrophysiologist:  None   Evaluation Performed:  Follow-Up Visit  Chief Complaint: Chest discomfort  History of Present Illness:    Deborah Hutchinson is a 73 y.o. female with patient is a pleasant 73 year old female.  She has past medical history of essential hypertension, dyslipidemia and diabetes mellitus.  She was found to have mild aortic stenosis in 2018.  She also had CT coronary angiography with unremarkable coronary arteries in view months ago.  She tells me that she has some chest discomfort at times not related to exertion.  No radiation to the neck or to the arms.  It is stabbing in nature.  At the time of my evaluation, the patient is alert awake oriented and in no distress.  She leads a sedentary lifestyle.  The patient does not have symptoms concerning for COVID-19 infection (fever, chills, cough, or new shortness of breath).    Past Medical History:  Diagnosis Date  . Angina    normal stress test  . Asthma   . Bronchitis   . Coronary artery disease involving native coronary artery of native heart without angina pectoris  06/10/2015  . Diabetes mellitus   . Diabetes mellitus due to underlying condition with unspecified complications ([place]) 10/08/7033  . Dyslipidemia 06/10/2015  . Essential hypertension 06/10/2015  . GERD (gastroesophageal reflux disease)   . H/O hiatal hernia   . Heart murmur   . Hyperlipemia   . Hypertension   . Nonspecific (abnormal) findings on radiological and other examination of gastrointestinal tract 05/19/2011  . Pain and swelling of toe of right foot 07/15/2016  . Palpitations 06/02/2016  . Shortness of breath    on  excertion   Past Surgical History:  Procedure Laterality Date  . ABDOMINAL HYSTERECTOMY    . ABDOMINAL HYSTERECTOMY    . BREAST SURGERY     begin mass,left  breast  . COLONOSCOPY  08/17/2015   Colonic polyp status post polypectomy. Mild pancolonic diverticulosis. Highly redundant colon.   Marland Kitchen DILATION AND CURETTAGE OF UTERUS     one  . ESOPHAGOGASTRODUODENOSCOPY  08/17/2015   Moderate hiatal hernia. Otherwise noraml EGD.   . EUS  05/19/2011   Procedure: UPPER ENDOSCOPIC ULTRASOUND (EUS) LINEAR;  Surgeon: Owens Loffler, MD;  Location: [facility] ENDOSCOPY;  Service: Endoscopy;  Laterality: N/A;  radial linear   . HYSTEROTOMY    . KNEE SURGERY    . LEFT HEART CATH AND CORONARY ANGIOGRAPHY    . right thumb     tendon repaire  . SHOULDER SURGERY     rotator cuff repair  . WRIST SURGERY     rigth  Current Meds  Medication Sig  . albuterol (PROVENTIL HFA;VENTOLIN HFA) 108 (90 Base) MCG/ACT inhaler Inhale 2 puffs into the lungs every 6 (six) hours as needed for wheezing or shortness of breath.  Marland Kitchen amLODipine (NORVASC) 10 MG tablet Take 10 mg by mouth daily.  Marland Kitchen aspirin 81 MG tablet Take 81 mg by mouth daily.   . BD PEN NEEDLE NANO U/F 32G X 4 MM MISC   . budesonide-formoterol (SYMBICORT) 160-4.5 MCG/ACT inhaler Inhale 2 puffs into the lungs 2 (two) times daily.  Marland Kitchen buPROPion (WELLBUTRIN XL) 300 MG 24 hr tablet Take 150 mg by mouth daily.   . Cholecalciferol (VITAMIN D PO) Take  5,000 Units by mouth 2 (two) times daily.   Marland Kitchen dicyclomine (BENTYL) 20 MG tablet Take 20 mg by mouth every 6 (six) hours.  Marland Kitchen escitalopram (LEXAPRO) 10 MG tablet Take 10 mg by mouth daily.  . famotidine (PEPCID) 40 MG tablet Take 1 tablet (40 mg total) by mouth 2 (two) times daily.  . fluticasone (VERAMYST) 27.5 MCG/SPRAY nasal spray Place 2 sprays into the nose daily.  Marland Kitchen gabapentin (NEURONTIN) 300 MG capsule Take 300 mg by mouth 3 (three) times daily.  . hydrALAZINE (APRESOLINE) 25 MG tablet Take 37.5 mg by mouth 3 (three) times daily.  . hydrocortisone (ANUSOL-HC) 2.5 % rectal cream Place 1 application rectally 2 (two) times daily.  . isosorbide mononitrate (IMDUR) 60 MG 24 hr tablet Take 60 mg by mouth daily.  . Lancets (ACCU-CHEK MULTICLIX) lancets   . losartan-hydrochlorothiazide (HYZAAR) 100-25 MG tablet Take 1 tablet by mouth daily.  . metFORMIN (GLUCOPHAGE) 500 MG tablet Take 500 mg by mouth 2 (two) times daily with a meal.   . montelukast (SINGULAIR) 10 MG tablet Take 10 mg by mouth daily.  . Multiple Vitamin (MULTIVITAMIN) capsule Take 1 capsule by mouth daily.    . nitroGLYCERIN (NITROSTAT) 0.4 MG SL tablet Place 1 tablet (0.4 mg total) under the tongue every 5 (five) minutes x 3 doses as needed for chest pain.  Marland Kitchen omeprazole (PRILOSEC) 20 MG capsule Take 20 mg by mouth 2 (two) times daily before a meal.   . rosuvastatin (CRESTOR) 10 MG tablet TAKE 1 TABLET EVERY DAY  . traMADol (ULTRAM) 50 MG tablet Take 50 mg by mouth 2 (two) times daily as needed for pain.  Marland Kitchen VICTOZA 18 MG/3ML SOPN 12 mg daily.   . [DISCONTINUED] EPIPEN 2-PAK 0.3 MG/0.3ML SOAJ injection Inject 0.3 mg into the muscle as needed.      Allergies:   Penicillins   Social History   Tobacco Use  . Smoking status: Former Smoker    Packs/day: 1.00    Last attempt to quit: 05/17/2011    Years since quitting: 7.3  . Smokeless tobacco: Never Used  Substance Use Topics  . Alcohol use: No  . Drug use: No     Family Hx:  The patient's family history includes Diabetes in her sister; Hypertension in her daughter.  ROS:   Please see the history of present illness.    As mentioned above All other systems reviewed and are negative.   Prior CV studies:   The following studies were reviewed today:  CT coronary angiography and echocardiogram report was discussed with the patient at length  Labs/Other Tests and Data Reviewed:    EKG:  No ECG reviewed.  Recent Labs: 02/02/2018: BUN 14; Creat 0.80; Hemoglobin 12.6; Platelets 435; Potassium 4.5; Sodium 140 03/06/2018: ALT 18   Recent Lipid Panel Lab Results  Component Value Date/Time   CHOL 169 03/06/2018 03:10 PM   TRIG 101 03/06/2018 03:10 PM   HDL 88 03/06/2018 03:10 PM   CHOLHDL 1.9 03/06/2018 03:10 PM   LDLCALC 61 03/06/2018 03:10 PM    Wt Readings from Last 3 Encounters:  09/21/18 169 lb (76.7 kg)  06/28/18 171 lb (77.6 kg)  02/02/18 171 lb 2 oz (77.6 kg)     Objective:    Vital Signs:  BP (!) 152/90 (BP Location: Left Arm, Patient Position: Sitting, Cuff Size: Normal)   Pulse 80   Ht 5' 5.5" (1.664 m)   Wt 169 lb (76.7 kg)   BMI 27.70 kg/m    VITAL SIGNS:  reviewed  ASSESSMENT & PLAN:    1. Chest discomfort: I reassured the patient about my findings.  Her CT coronary angiography recently was unremarkable.  There may be an element of spasm and therefore I have doubled her isosorbide.  She will keep a track of her pulse blood pressures and will get them for [place] in 3 weeks.  She knows to go to the nearest emergency room for any concerning symptoms.  She I told her that she could use nitroglycerin only after she sits down on the recliner or lies down if she has any significant symptoms.  Also noncardiac issues for chest pain are worth evaluating.  I told her to get an appointment with a pulmonologist as she has not seen him for a long time. 2. Mild aortic stenosis: Echocardiogram will be done to review this. 3. Essential hypertension: The  increase in isosorbide will also help her blood pressure issues. 4. Diabetes mellitus and mixed dyslipidemia: Lipids are followed by primary care physician. 5. Patient will be seen in follow-up appointment in 3 weeks or earlier if the patient has any concerns   COVID-19 Education: The signs and symptoms of COVID-19 were discussed with the patient and how to seek care for testing (follow up with PCP or arrange E-visit).  The importance of social distancing was discussed today.  Time:   Today, I have spent 15 minutes with the patient with telehealth technology discussing the above problems.     Medication Adjustments/Labs and Tests Ordered: Current medicines are reviewed at length with the patient today.  Concerns regarding medicines are outlined above.   Tests Ordered: No orders of the defined types were placed in this encounter.   Medication Changes: No orders of the defined types were placed in this encounter.   Disposition:  Follow up in 3 week(s)  Signed, [name], MD  09/21/2018 9:40 AM    [place]

## 2018-09-21 NOTE — Patient Instructions (Signed)
Medication Instructions:  Your physician has recommended you make the following change in your medication:  INCREASE: Isosorbide 60 Mg (2 tabs) daily  If you need a refill on your cardiac medications before your next appointment, please call your pharmacy.   Lab work: None If you have labs (blood work) drawn today and your tests are completely normal, you will receive your results only by: Marland Kitchen MyChart Message (if you have MyChart) OR . A paper copy in the mail If you have any lab test that is abnormal or we need to change your treatment, we will call you to review the results.  Testing/Procedures: Your physician has requested that you have an echocardiogram. Echocardiography is a painless test that uses sound waves to create images of your heart. It provides your doctor with information about the size and shape of your heart and how well your heart's chambers and valves are working. This procedure takes approximately one hour. There are no restrictions for this procedure.  YOUR APPOINTMENT FOR THE ECHO IS MAY 22,220 AT 9:15  Follow-Up: At [facility] you and your health needs are our priority.  As part of our continuing mission to provide you with exceptional heart care, we have created designated Provider Care Teams.  These Care Teams include your primary Cardiologist (physician) and Advanced Practice Providers (APPs -  Physician Assistants and Nurse Practitioners) who all work together to provide you with the care you need, when you need it. You will need a follow up appointment in 3 weeks.  Any Other Special Instructions Will Be Listed Below (If Applicable).   CHECK BLOOD PRESSURE AND HEART RATE DAILY AT THE SAME TIME AND RECORD. HAVE READY AT YOUR 3 WEEK APPOINTMENT.

## 2018-09-21 NOTE — Telephone Encounter (Signed)
Primary Pulmonologist: BQ Last office visit and with whom: 12/12/17 with BQ  What do we see them for (pulmonary problems): asthma  Last OV assessment/plan: Instructions  Return in about 1 month (around 01/09/2018).  Mild persistent asthma: Take Symbicort 2 puffs twice a day no matter how you feel Stop Spiriva Use albuterol as needed for chest tightness wheezing or shortness of breath.  You can use 2 puffs every 4-6 hours as needed Get a flu shot in the fall Practice good hand hygiene Exercise regularly  Worsening shortness of breath: We will check another lung function test to see if there is any difference compared to the one done over a year ago, this can be done at [facility] Because of the episodic nature of your shortness of breath I think you and your cardiologist may want to consider an event monitor.  When you come back on the next visit be sure to bring all of your medicines so that we can go over them with you  We will see you back in 4 weeks with a nurse practitioner here in this office.      Was appointment offered to patient (explain)?  Pt wants recommendations  Reason for call:  Called and spoke with pt who stated she has had SOB for a while but stated for about a week, the breathing has been worse. Pt also states that she has sharp pains in her chest and in her head.  Pt states she has been using her spiriva for emergencies, is taking her symbicort twice daily, and is using her ventolin inhaler 2 puffs three times daily. Pt stated she was told this information by PCP.  Pt states she has been taking inhalers this way since 06/19/2018 as this was her last visit with PCP.  Pt also has an occ cough due to nasal drainage and states the drainage is clear in color. Pt denies any complaints of fever.  Tonya, please advise on this for pt. Thanks!

## 2018-09-21 NOTE — Telephone Encounter (Signed)
Called and spoke with pt letting her know that we needed to get her scheduled for a televisit to further address symptoms. Pt expressed understanding. televisit has been scheduled for pt with SG today, 5/15 at 3pm. Nothing further needed.

## 2018-09-21 NOTE — Progress Notes (Signed)
Virtual Visit via Video Note  I connected with Deborah Hutchinson on 09/21/18 at  3:00 PM EDT by a video enabled telemedicine application and verified that I am speaking with the correct person using two identifiers.  I  confirmed date of birth and address to authenticate patient  Identity. My nurse Quentin Ore reviewed medications and ordered any refills required.   Location: Patient: at home Provider: In Pulmonary office at [address] [place], [place]   I discussed the limitations of evaluation and management by telemedicine and the availability of in person appointments. The patient expressed understanding and agreed to proceed.  Pt is a 73 year old female former smoker ( Quit 2013) with mild persistent asthma and intermittent dyspnea that Dr. Lake Bells suspects may have a cardiogenic etiology. PFT's show some degree of diffusion abnormality with a normal hemoglobin and small airways obstruction.  History of Present Illness: Pt. Presents for an acute OV. She called the office with complaints of worsening shortness of breath. She did have a telephonic visit with her cardiologist today .She had called them for  Chest discomfort without exertion. No radiation to neck or arms.They are increasing her isosorbide.Pt. states when she is short of breath she also has some chest pain. She states she is taking her Symbicort 2 puffs twice daily. She is taking her Ventolin 2 puffs three times daily, she is using her Spiriva as rescue. I explained that she is not taking her medications correctly. We reviewed proper use of her medications. She states she has had this shortness of breath for a long time. She states it is worse with exertion.  She does not wear oxygen. She states she is using her Singulair daily. She states she is using her  Fluticasone nasal spray daily.She states she does have some post nasal drip with her allergies. Her nose does run on occasion. Secretions are not colored or thick. She  does not have any wheezing.She does not complain of cough. She denies any fever, orthopnea or hemoptysis.  Observations/Objective:  PFT: April 2018 ratio 90%, FVC 2.22 L 87% predicted, total lung capacity 5.26 L 115% predicted, DLCO 14.18 mL 64% predicted December 2018 cardiopulmonary exercise test spirometry ratio 70%, FEV1 1.81 L 76% predicted, FEV1 post 1.64 L, 9% change: VO2 max 12.1 mL/kg/min, 83% predicted, heart rate maximum exercise 101, ventilatory max at exercise approximately 50% of maximum with exercise ventilatory capacity  O2 saturation 99%  Some degree of diffusion abnormality with a normal hemoglobin and small airways obstruction.  Echo: November 2018 transthoracic echocardiogram LVEF 60-65%, RV normal, PA pressure estimate normal  Assessment and Plan: Shortness of breath/ Asthma No wheezing  Intermittent shortness of breath Incorrect use of her maintenance medications vs rescue medications Re-started on Spiriva per her PCP after Dr. Lake Bells d/c'd Plan Take Symbicort 2 puffs twice a day no matter how you feel Continue Spiriva 2 puffs once daily no matter how you feel Rinse mouth/ brush teeth  after inhaler use Use Ventolin  as needed for chest tightness wheezing or shortness of breath. You can use 2 puffs as needed every 4-6 hours  Continue your Montelukast 10 mg  daily as you have been doing. Continue your Fluticasone nasal spray daily  Practice good hand hygiene Exercise regularly We will schedule a PFT once the office re-starts testing We will see you in the office 09/24/2018 at 3:30 for CXR and Ambulatory saturation test to ensure you are not dropping your oxygen levels. If shortness of  breath worsens, seek emergency care. Please contact office for sooner follow up if symptoms do not improve or worsen or seek emergency care   Chest pain Per cardiology recommendations made today on your teleconference with Dr. Lyndel Safe    Follow Up Instructions: Scheduled  for an OV 09/24/2018 at 3:30 pm   I discussed the assessment and treatment plan with the patient. The patient was provided an opportunity to ask questions and all were answered. The patient agreed with the plan and demonstrated an understanding of the instructions.   The patient was advised to call back or seek an in-person evaluation if the symptoms worsen or if the condition fails to improve as anticipated.  I provided 25  minutes of non-face-to-face time during this encounter.   Magdalen Spatz, NP 09/21/2018 3:42 PM

## 2018-09-21 NOTE — Telephone Encounter (Signed)
Patient needs tele-visit. Please place on sarah's schedule. Thanks.

## 2018-09-24 ENCOUNTER — Encounter: Payer: Self-pay | Admitting: Acute Care

## 2018-09-24 ENCOUNTER — Ambulatory Visit (INDEPENDENT_AMBULATORY_CARE_PROVIDER_SITE_OTHER): Payer: Medicare HMO

## 2018-09-24 ENCOUNTER — Other Ambulatory Visit: Payer: Self-pay

## 2018-09-24 ENCOUNTER — Ambulatory Visit (INDEPENDENT_AMBULATORY_CARE_PROVIDER_SITE_OTHER): Payer: Medicare HMO | Admitting: Acute Care

## 2018-09-24 VITALS — BP 126/70 | HR 76 | Ht 65.5 in | Wt 171.8 lb

## 2018-09-24 DIAGNOSIS — R51 Headache: Secondary | ICD-10-CM | POA: Diagnosis not present

## 2018-09-24 DIAGNOSIS — R0602 Shortness of breath: Secondary | ICD-10-CM

## 2018-09-24 DIAGNOSIS — E876 Hypokalemia: Secondary | ICD-10-CM

## 2018-09-24 DIAGNOSIS — R519 Headache, unspecified: Secondary | ICD-10-CM

## 2018-09-24 DIAGNOSIS — R079 Chest pain, unspecified: Secondary | ICD-10-CM | POA: Diagnosis not present

## 2018-09-24 DIAGNOSIS — R0600 Dyspnea, unspecified: Secondary | ICD-10-CM

## 2018-09-24 DIAGNOSIS — J4520 Mild intermittent asthma, uncomplicated: Secondary | ICD-10-CM

## 2018-09-24 HISTORY — DX: Headache, unspecified: R51.9

## 2018-09-24 NOTE — Assessment & Plan Note (Signed)
Dyspnea with episodes of chest pain and headache that are self limiting with no notable trigger Plan: Ambulatory saturation test today>> saturation low  96%,  Take Symbicort 2 puffs twice a day no matter how you feel Continue Spiriva 2 puffs once daily no matter how you feel Rinse mouth/ brush teeth  after inhaler use Use Ventolin  as needed for chest tightness wheezing or shortness of breath. You can use 2 puffs as needed every 4-6 hours  Practice good hand hygiene Exercise regularly, but do not over do We will schedule a PFT once the office re-starts testing, this may be awhile Follow up with Dr. Lake Bells  In 3 months or sooner if you get worse not better.  Continue to socially distance yourself , wash hand frequently, and wear a face mask if you leave your home. Remember to utilize the early hours at grocery stores/ drug stores  for people > 17 years old or any underlying health risks, as the stores are cleaner and less crowded at these times.

## 2018-09-24 NOTE — Progress Notes (Signed)
Reviewed, agree 

## 2018-09-24 NOTE — Progress Notes (Signed)
History of Present Illness Deborah Hutchinson is a 73 y.o. female with female former smoker ( Quit 2013) with mild persistent asthma and intermittent dyspnea that Dr. Lake Bells suspects may have a cardiogenic etiology. PFT's show some degree of diffusion abnormality with a normal hemoglobin and small airways obstruction.   09/24/2018  Pt presents for OV after a tele visit 09/21/2018 for complaints of worsening shortness of breath. She had a telephone visit with her cardiologist the same day and they had increased her isosorbide. She had confusion regarding the difference between her maintenance inhaler and her rescue inhaler. She was not using these correctly.She states she has had this shortness of breath for " a long time".She presents today for a CXR and ambulatory saturation test to ensure she does not need oxygen. She has been compliant with her Singulair and her Flonase nasal spray for her seasonal allergies.She is in no distress today.She states she has episodes where her chest starts to hurt acutely with a dull pain that continues until she gets her breath back. She feels she cannot breath, then her head starts to hurt.She states she has pain  under her breast.She states these episodes occur from out of the blue when she is sitting or standing. 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 She denies fever, orthopnea or hemoptysis.  Oxygen saturation 100% on RA.  Test Results: Cardiopulmonary Stress Test 12/12/2017 Exercise testing with gas exchange demonstrates mild functional impairment when compared to matched sedentary norms. There is no indication for cardiovascular  impairment. At peak exercise, patient appears limited due to her high ventilation and chronotropic incompetence. Elevated VE/VCO2 slope may indicate hyperventilation or diastolic dysfunction in relation to her body habitus. Reduced FEV1 post-exercise may indicate reactive airways though the magnitude of reduction does not meet formal diagnostic criteria for exercise-induced asthma.  PFT: April 2018 ratio 90%, FVC 2.22 L 87% predicted, total lung capacity 5.26 L 115% predicted, DLCO 14.18 mL 64% predicted December 2018 cardiopulmonary exercise test spirometry ratio 70%, FEV1 1.81 L 76% predicted, FEV1 post 1.64 L, 9% change: VO2 max 12.1 mL/kg/min, 83% predicted, heart rate maximum exercise 101, ventilatory max at exercise approximately 50% of maximum with exercise ventilatory capacity O2 saturation 99%  Some degree of diffusion abnormality with a normal hemoglobin and small airways obstruction.  Echo: November 2018 transthoracic echocardiogram LVEF 60-65%, RV normal, PA pressure estimate normal  CBC Latest Ref Rng & Units 02/02/2018 09/18/2017  WBC 3.8 - 10.8 Thousand/uL 6.6 7.9  Hemoglobin 11.7 - 15.5 g/dL 12.6 12.2  Hematocrit 35.0 - 45.0 % 38.3 38.0  Platelets 140 - 400 Thousand/uL 435(H) 430(H)    BMP Latest Ref Rng & Units 02/02/2018 12/15/2017  Glucose 65 - 99 mg/dL 80 80  BUN 7 - 25 mg/dL 14 9  Creatinine 0.60 - 0.93 mg/dL 0.80 0.71  BUN/Creat Ratio 6 - 22 (calc) NOT APPLICABLE 13  Sodium 166 - 146 mmol/L 140 145(H)  Potassium 3.5 - 5.3 mmol/L 4.5 4.4  Chloride 98 - 110 mmol/L 103 105  CO2 20 - 32 mmol/L 28 24  Calcium 8.6 - 10.4 mg/dL 10.1 10.1  BNP No results found for: BNP  ProBNP No results found for: PROBNP  PFT No results found for: FEV1PRE, FEV1POST, FVCPRE, FVCPOST, TLC, DLCOUNC, PREFEV1FVCRT, PSTFEV1FVCRT  Dg Chest 2 View  Result Date: 09/24/2018 CLINICAL DATA:  Shortness of breath EXAM: CHEST - 2 VIEW COMPARISON:  07/17/2016 CT chest dated 09/27/2017.  FINDINGS: The heart size and mediastinal contours are within normal limits. Both lungs are clear. The visualized skeletal structures are unremarkable. IMPRESSION: No active cardiopulmonary disease. Electronically Signed   By: Constance Holster M.D.   On: 09/24/2018 16:26     Past medical hx Past Medical History:  Diagnosis Date  . Angina    normal stress test  . Asthma   . Bronchitis   . 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 Diabetes mellitus   . Diabetes mellitus due to underlying condition with unspecified complications ([facility]) 06/15/348  . Dyslipidemia 06/10/2015  . Essential hypertension 06/10/2015  . GERD (gastroesophageal reflux disease)   . H/O hiatal hernia   . Heart murmur   . Hyperlipemia   . Hypertension   . Nonspecific (abnormal) findings on radiological and other examination of gastrointestinal tract 05/19/2011  . Pain and swelling of toe of right foot 07/15/2016  . Palpitations 06/02/2016  . Shortness of breath    on  excertion     Social History   Tobacco Use  . Smoking status: Former Smoker    Packs/day: 1.00    Last attempt to quit: 05/17/2011    Years since quitting: 7.3  . Smokeless tobacco: Never Used  Substance Use Topics  . Alcohol use: No  . Drug use: No    [name] reports that she quit smoking about 7 years ago. She smoked 1.00 pack per day. She has never used smokeless tobacco. She reports that she does not drink alcohol or use drugs.  Tobacco Cessation: Former smoker quit smoking 2013  Past surgical hx, Family hx, Social hx all reviewed.  Current Outpatient Medications on File Prior to Visit  Medication Sig  . albuterol (PROVENTIL HFA;VENTOLIN HFA) 108 (90 Base) MCG/ACT inhaler Inhale 2 puffs into the lungs every 6 (six) hours as needed for wheezing or shortness of breath.  Marland Kitchen amLODipine (NORVASC) 10 MG tablet Take 10 mg by mouth daily.  Marland Kitchen aspirin 81 MG tablet Take 81 mg by mouth  daily.   . BD PEN NEEDLE NANO U/F 32G X 4 MM MISC   . budesonide-formoterol (SYMBICORT) 160-4.5 MCG/ACT inhaler Inhale 2 puffs into the lungs 2 (two) times daily.  Marland Kitchen buPROPion (WELLBUTRIN XL) 300 MG 24 hr tablet Take 150 mg by mouth daily.   . Cholecalciferol (VITAMIN D PO) Take 5,000 Units by mouth 2 (two) times daily.   Marland Kitchen dicyclomine (BENTYL) 20 MG tablet Take 20 mg by mouth every 6 (six) hours.  Marland Kitchen escitalopram (LEXAPRO) 10 MG tablet Take 10 mg by mouth daily.  . famotidine (PEPCID) 40 MG tablet Take 1 tablet (40 mg total) by mouth 2 (two) times daily.  . fluticasone (VERAMYST) 27.5 MCG/SPRAY nasal spray Place 2 sprays into the nose daily.  Marland Kitchen gabapentin (NEURONTIN) 300 MG capsule Take 300 mg by mouth 3 (three) times daily.  . hydrALAZINE (APRESOLINE) 25 MG tablet Take 37.5 mg by mouth 3 (three) times daily.  . hydrocortisone (ANUSOL-HC) 2.5 % rectal cream Place 1 application rectally 2 (two) times daily.  . isosorbide mononitrate (IMDUR) 60 MG 24 hr tablet Take 120 mg by  mouth daily.  . Lancets (ACCU-CHEK MULTICLIX) lancets   . losartan-hydrochlorothiazide (HYZAAR) 100-25 MG tablet Take 1 tablet by mouth daily.  . metFORMIN (GLUCOPHAGE) 500 MG tablet Take 500 mg by mouth 2 (two) times daily with a meal.   . montelukast (SINGULAIR) 10 MG tablet Take 10 mg by mouth daily.  . Multiple Vitamin (MULTIVITAMIN) capsule Take 1 capsule by mouth daily.    . nitroGLYCERIN (NITROSTAT) 0.4 MG SL tablet Place 1 tablet (0.4 mg total) under the tongue every 5 (five) minutes x 3 doses as needed for chest pain.  Marland Kitchen omeprazole (PRILOSEC) 20 MG capsule Take 20 mg by mouth 2 (two) times daily before a meal.   . rosuvastatin (CRESTOR) 10 MG tablet TAKE 1 TABLET EVERY DAY  . traMADol (ULTRAM) 50 MG tablet Take 50 mg by mouth 2 (two) times daily as needed for pain.  Marland Kitchen VICTOZA 18 MG/3ML SOPN 12 mg daily.    No current facility-administered medications on file prior to visit.      Allergies  Allergen Reactions   . Penicillins Rash    Reaction was 30years ago  Has never taken again    Review Of Systems:  Constitutional:   No  weight loss, night sweats,  Fevers, chills, fatigue, or  lassitude.  HEENT:   + headaches with episodes that self resolve, No  Difficulty swallowing,  Tooth/dental problems, or  Sore throat,  No sneezing, itching, ear ache, nasal congestion, post nasal drip,   CV:  + chest pain,  No Orthopnea, PND, swelling in lower extremities, anasarca, dizziness, palpitations, syncope.   GI  No heartburn, indigestion, abdominal pain, nausea, vomiting, diarrhea, change in bowel habits, loss of appetite, bloody stools.   Resp: + shortness of breath with episodeswith exertion or at rest, that self resolve,  No excess mucus, no productive cough,  No non-productive cough,  No coughing up of blood.  No change in color of mucus.  No wheezing.  No chest wall deformity  Skin: no rash or lesions, warm and dry, intact.  GU: no dysuria, change in color of urine, no urgency or frequency.  No flank pain, no hematuria   MS:  No joint pain or swelling.  No decreased range of motion.  No back pain.  Psych:  No change in mood or affect. No depression , denies  anxiety.  No memory loss.   Vital Signs BP 126/70 (BP Location: Left Arm, Cuff Size: Normal)   Pulse 76   Ht 5' 5.5" (1.664 m)   Wt 171 lb 12.8 oz (77.9 kg)   SpO2 100%   BMI 28.15 kg/m    Physical Exam:  General- No distress,  A&Ox3, pleasant  ENT: No sinus tenderness, TM clear, pale nasal mucosa, no oral exudate,no post nasal drip, no LAN Cardiac: S1, S2, regular rate and rhythm, no murmur Chest: No wheeze/ rales/ dullness; no accessory muscle use, no nasal flaring, no sternal retractions Abd.: Soft Non-tender, ND, BS +. Body mass index is 28.15 kg/m. Ext: No clubbing cyanosis, edema Neuro:  normal strength, A&O  X 3, MAE x 4 Skin: No rashes or lesions, , warm and dry Psych: normal mood and behavior   Assessment/Plan  Asthma  Stable interval Plan Take Symbicort 2 puffs twice a day no matter how you feel Continue Spiriva 2 puffs once daily no matter how you feel Rinse mouth/ brush teeth  after inhaler use Use Ventolin  as needed for chest tightness wheezing or shortness of breath. You  can use 2 puffs as needed every 4-6 hours  Continue your Montelukast 10 mg  daily as you have been doing. Continue your Fluticasone nasal spray daily  Practice good hand hygiene Exercise regularly, but do not over do   Dyspnea Dyspnea with episodes of chest pain and headache that are self limiting with no notable trigger Plan: Ambulatory saturation test today>> saturation low  96%,  Take Symbicort 2 puffs twice a day no matter how you feel Continue Spiriva 2 puffs once daily no matter how you feel Rinse mouth/ brush teeth  after inhaler use Use Ventolin  as needed for chest tightness wheezing or shortness of breath. You can use 2 puffs as needed every 4-6 hours  Practice good hand hygiene Exercise regularly, but do not over do We will schedule a PFT once the office re-starts testing, this may be awhile Follow up with Dr. Lake Bells  In 3 months or sooner if you get worse not better.  Continue to socially distance yourself , wash hand frequently, and wear a face mask if you leave your home. Remember to utilize the early hours at grocery stores/ drug stores  for people > 89 years old or any underlying health risks, as the stores are cleaner and less crowded at these times.   Chest pain, unspecified Intermittent Chest pain with dyspnea and headache, self resolved With activity and at rest, but worse with activity Plan; CXR today, we will call you with results EKG today>> NSR Echo as is already scheduled  09/28/2018 Follow up with cardiology, consider moving video appointment scheduled 10/19/2018 to sooner date Labs today CBC with diff CMET Troponin TSH BNP We will call you with results. Practice good hand hygiene  Exercise regularly, but do not over do Follow up in 3 months or sooner  as needed Seek emergency care for chest pain that does not self resolve   Continue to socially distance yourself , wash hand frequently, and wear a face mask if you leave your home. Remember to utilize the early hours at grocery stores/ drug stores  for people > 82 years old or any underlying health risks, as the stores are cleaner and less crowded at these times.   Headache HA with episodes of chest pain that self resolve Plan: Follow up with Neuro as is scheduled 10/11/2018 Seek emergency care for Headache that does resolve     Magdalen Spatz, NP 09/24/2018  4:45 PM

## 2018-09-24 NOTE — Patient Instructions (Addendum)
It is good to see you today. CXR today, we will call you with results EKG today>>> NSR Echo as is already scheduled  09/28/2018 Follow up with cardiology Labs today CBC with diff CMET Troponin TSH BNP We will call you with results. Ambulatory saturation test today>> saturation low  96% after 2 laps Take Symbicort 2 puffs twice a day no matter how you feel Continue Spiriva 2 puffs once daily no matter how you feel Rinse mouth/ brush teeth  after inhaler use Use Ventolin  as needed for chest tightness wheezing or shortness of breath. You can use 2 puffs as needed every 4-6 hours  Continue your Montelukast 10 mg  daily as you have been doing. Continue your Fluticasone nasal spray daily  Practice good hand hygiene Exercise regularly, but do not over do We will schedule a PFT once the office re-starts testing, this may be awhile Follow up with Dr. Lake Bells  In 3 months or sooner if you get worse not better.  Continue to socially distance yourself , wash hand frequently, and wear a face mask if you leave your home. Remember to utilize the early hours at grocery stores/ drug stores  for people > 78 years old or any underlying health risks, as the stores are cleaner and less crowded at these times.

## 2018-09-24 NOTE — Assessment & Plan Note (Addendum)
Intermittent Chest pain with dyspnea and headache, self resolved With activity and at rest, but worse with activity Plan; CXR today, we will call you with results EKG today>> NSR Echo as is already scheduled  09/28/2018 Follow up with cardiology, consider moving video appointment scheduled 10/19/2018 to sooner date Labs today CBC with diff CMET Troponin TSH BNP We will call you with results. Practice good hand hygiene Exercise regularly, but do not over do Follow up in 3 months or sooner  as needed Seek emergency care for chest pain that does not self resolve   Continue to socially distance yourself , wash hand frequently, and wear a face mask if you leave your home. Remember to utilize the early hours at grocery stores/ drug stores  for people > 98 years old or any underlying health risks, as the stores are cleaner and less crowded at these times.

## 2018-09-24 NOTE — Assessment & Plan Note (Signed)
HA with episodes of chest pain that self resolve Plan: Follow up with Neuro as is scheduled 10/11/2018 Seek emergency care for Headache that does resolve

## 2018-09-24 NOTE — Assessment & Plan Note (Signed)
Stable interval Plan Take Symbicort 2 puffs twice a day no matter how you feel Continue Spiriva 2 puffs once daily no matter how you feel Rinse mouth/ brush teeth  after inhaler use Use Ventolin  as needed for chest tightness wheezing or shortness of breath. You can use 2 puffs as needed every 4-6 hours  Continue your Montelukast 10 mg  daily as you have been doing. Continue your Fluticasone nasal spray daily  Practice good hand hygiene Exercise regularly, but do not over do

## 2018-09-25 LAB — COMPREHENSIVE METABOLIC PANEL
ALT: 20 U/L (ref 0–35)
AST: 20 U/L (ref 0–37)
Albumin: 4.2 g/dL (ref 3.5–5.2)
Alkaline Phosphatase: 70 U/L (ref 39–117)
BUN: 8 mg/dL (ref 6–23)
CO2: 31 mEq/L (ref 19–32)
Calcium: 9.4 mg/dL (ref 8.4–10.5)
Chloride: 103 mEq/L (ref 96–112)
Creatinine, Ser: 0.78 mg/dL (ref 0.40–1.20)
GFR: 87.07 mL/min (ref >60.00)
Glucose, Bld: 82 mg/dL (ref 70–99)
Potassium: 2.9 mEq/L — ABNORMAL LOW (ref 3.5–5.1)
Sodium: 142 mEq/L (ref 135–145)
Total Bilirubin: 0.2 mg/dL (ref 0.2–1.2)
Total Protein: 6.8 g/dL (ref 6.0–8.3)

## 2018-09-25 LAB — CBC WITH DIFFERENTIAL/PLATELET
Basophils Absolute: 0.1 10*3/uL (ref 0.0–0.1)
Basophils Relative: 1.5 % (ref 0.0–3.0)
Eosinophils Absolute: 0.2 10*3/uL (ref 0.0–0.7)
Eosinophils Relative: 3.2 % (ref 0.0–5.0)
HCT: 37.8 % (ref 36.0–46.0)
Hemoglobin: 12.6 g/dL (ref 12.0–15.0)
Lymphocytes Relative: 30.2 % (ref 12.0–46.0)
Lymphs Abs: 1.9 10*3/uL (ref 0.7–4.0)
MCHC: 33.3 g/dL (ref 30.0–36.0)
MCV: 91.4 fl (ref 78.0–100.0)
Monocytes Absolute: 0.6 10*3/uL (ref 0.1–1.0)
Monocytes Relative: 9.8 % (ref 3.0–12.0)
Neutro Abs: 3.5 10*3/uL (ref 1.4–7.7)
Neutrophils Relative %: 55.3 % (ref 43.0–77.0)
Platelets: 350 10*3/uL (ref 150.0–400.0)
RBC: 4.13 Mil/uL (ref 3.87–5.11)
RDW: 13.8 % (ref 11.5–15.5)
WBC: 6.3 10*3/uL (ref 4.0–10.5)

## 2018-09-25 LAB — TSH: TSH: 1.56 u[IU]/mL (ref 0.35–4.50)

## 2018-09-25 LAB — TROPONIN I: TNIDX: 0.01 ug/l (ref 0.00–0.06)

## 2018-09-25 LAB — BRAIN NATRIURETIC PEPTIDE: Pro B Natriuretic peptide (BNP): 36 pg/mL (ref 0.0–100.0)

## 2018-09-25 NOTE — Progress Notes (Signed)
Reviewed, agree 

## 2018-09-28 ENCOUNTER — Telehealth: Payer: Self-pay

## 2018-09-28 ENCOUNTER — Other Ambulatory Visit: Payer: Self-pay

## 2018-09-28 ENCOUNTER — Ambulatory Visit (INDEPENDENT_AMBULATORY_CARE_PROVIDER_SITE_OTHER): Payer: Medicare HMO

## 2018-09-28 DIAGNOSIS — R0789 Other chest pain: Secondary | ICD-10-CM | POA: Diagnosis not present

## 2018-09-28 DIAGNOSIS — I350 Nonrheumatic aortic (valve) stenosis: Secondary | ICD-10-CM | POA: Diagnosis not present

## 2018-09-28 NOTE — Telephone Encounter (Signed)
Mailbox was full unable to leave message concerning patient echo results.

## 2018-09-28 NOTE — Progress Notes (Signed)
Complete echocardiogram has been performed.  Jimmy Samiksha Pellicano RDCS, RVT 

## 2018-10-02 ENCOUNTER — Telehealth: Payer: Self-pay

## 2018-10-02 DIAGNOSIS — E1140 Type 2 diabetes mellitus with diabetic neuropathy, unspecified: Secondary | ICD-10-CM | POA: Diagnosis not present

## 2018-10-02 DIAGNOSIS — Z6827 Body mass index (BMI) 27.0-27.9, adult: Secondary | ICD-10-CM | POA: Diagnosis not present

## 2018-10-02 DIAGNOSIS — G5793 Unspecified mononeuropathy of bilateral lower limbs: Secondary | ICD-10-CM | POA: Diagnosis not present

## 2018-10-02 DIAGNOSIS — M1990 Unspecified osteoarthritis, unspecified site: Secondary | ICD-10-CM | POA: Diagnosis not present

## 2018-10-02 DIAGNOSIS — E663 Overweight: Secondary | ICD-10-CM | POA: Diagnosis not present

## 2018-10-02 NOTE — Telephone Encounter (Signed)
Information relayed, no further questions at this time.

## 2018-10-02 NOTE — Telephone Encounter (Signed)
Left message for patient to call back for echo results 

## 2018-10-04 DIAGNOSIS — R21 Rash and other nonspecific skin eruption: Secondary | ICD-10-CM | POA: Diagnosis not present

## 2018-10-08 DIAGNOSIS — E0822 Diabetes mellitus due to underlying condition with diabetic chronic kidney disease: Secondary | ICD-10-CM | POA: Diagnosis not present

## 2018-10-09 DIAGNOSIS — E0822 Diabetes mellitus due to underlying condition with diabetic chronic kidney disease: Secondary | ICD-10-CM | POA: Diagnosis not present

## 2018-10-10 DIAGNOSIS — E0822 Diabetes mellitus due to underlying condition with diabetic chronic kidney disease: Secondary | ICD-10-CM | POA: Diagnosis not present

## 2018-10-11 ENCOUNTER — Telehealth (INDEPENDENT_AMBULATORY_CARE_PROVIDER_SITE_OTHER): Payer: Medicare HMO | Admitting: Neurology

## 2018-10-11 ENCOUNTER — Other Ambulatory Visit: Payer: Self-pay

## 2018-10-11 ENCOUNTER — Encounter: Payer: Self-pay | Admitting: Neurology

## 2018-10-11 VITALS — Ht 65.5 in | Wt 170.0 lb

## 2018-10-11 DIAGNOSIS — H9313 Tinnitus, bilateral: Secondary | ICD-10-CM | POA: Diagnosis not present

## 2018-10-11 DIAGNOSIS — R51 Headache: Secondary | ICD-10-CM

## 2018-10-11 DIAGNOSIS — R519 Headache, unspecified: Secondary | ICD-10-CM

## 2018-10-11 DIAGNOSIS — E0822 Diabetes mellitus due to underlying condition with diabetic chronic kidney disease: Secondary | ICD-10-CM | POA: Diagnosis not present

## 2018-10-11 NOTE — Addendum Note (Signed)
Addended by: Clois Comber on: 10/11/2018 11:40 AM   Modules accepted: Orders

## 2018-10-11 NOTE — Progress Notes (Addendum)
Virtual Visit via Video Note The purpose of this virtual visit is to provide medical care while limiting exposure to the novel coronavirus.    Consent was obtained for video visit:  Yes.   Answered questions that patient had about telehealth interaction:  Yes.   I discussed the limitations, risks, security and privacy concerns of performing an evaluation and management service by telemedicine. I also discussed with the patient that there may be a patient responsible charge related to this service. The patient expressed understanding and agreed to proceed.  Pt location: Home Physician Location: office Name of referring provider:  [name], MD I connected with Junious Dresser at patients initiation/request on 10/11/2018 at  9:10 AM EDT by video enabled telemedicine application and verified that I am speaking with the correct person using two identifiers. Pt MRN:  [id number] Pt DOB:  [birth year] Video Participants:  Junious Dresser   History of Present Illness:  Deborah Hutchinson is a 73 year old woman with iron-deficiency anemia, type 2 diabetes mellitus, CAD, hypertension who presents for headache.  History supplemented by referring provider and ENT notes.  She began having headaches in 2017-2018.  They only occur when she has episodes of dyspnea.  She suddenly feels short of breath and lightheaded but doesn't pass out.  It is not exertional and happens at rest.  It does not occur out of sleep.  She has asthma and chronic bronchitis.  When these episodes occur, she develops 10/10 right sided stabbing headache.  She has associated weakness but denies associated neck pain, nausea, vomiting, photophobia, phonophobia, visual disturbance or unilateral numbness or weakness.  It typically lasts 5 minutes, about as long as the shortness of breath until she uses her inhaler.  Frequency varies.  She may have a week without episodes or twice a week or some weeks it may occur several times.  She also has longstanding  history of tinnitus, which she describes as sounding like "bees" in her head.  She was evaluated by ENT in April 2019.  Hearing test and tympanogram were unremarkable.  When she gets these headaches, the ringing sounds louder.  She denies prior history of headaches.  Current NSAIDS:  ASA 81mg  Current analgesics:  tramadol Current triptans:  none Current ergotamine:  none Current anti-emetic:  none Current muscle relaxants:  none Current anti-anxiolytic:  none Current sleep aide:  none Current Antihypertensive medications:  Imdur, hydralazine, Norvasc Current Antidepressant medications:  Lexapro 10mg , Wellbutrin Current Anticonvulsant medications:  Gabapentin 300mg  three times daily Current anti-CGRP:  none Current Vitamins/Herbal/Supplements:  none Current Antihistamines/Decongestants:  none Other therapy:  none Hormone/birth control:  none  09/24/18 LABS:  CBC with WBC 6.3, HGB 12.6, HCT 37.8, PLT 350; CMP with NA 142, K 2.9, Cl 103, CO2 31, glucose 82, BUN 8, Cr 0.78, t bili 0.2, ALP 70, AST 20, ALT 20; TSH 1.56.    Past Medical History: Past Medical History:  Diagnosis Date   Angina    normal stress test   Asthma    Bronchitis    Coronary artery disease involving native coronary artery of native heart without angina pectoris 06/10/2015   Diabetes mellitus    Diabetes mellitus due to underlying condition with unspecified complications ([place]) 07/14/8586   Dyslipidemia 06/10/2015   Essential hypertension 06/10/2015   GERD (gastroesophageal reflux disease)    H/O hiatal hernia    Heart murmur    Hyperlipemia    Hypertension    Nonspecific (abnormal) findings on radiological and  other examination of gastrointestinal tract 05/19/2011   Pain and swelling of toe of right foot 07/15/2016   Palpitations 06/02/2016   Shortness of breath    on  excertion    Medications: Outpatient Encounter Medications as of 10/11/2018  Medication Sig   albuterol (PROVENTIL HFA;VENTOLIN  HFA) 108 (90 Base) MCG/ACT inhaler Inhale 2 puffs into the lungs every 6 (six) hours as needed for wheezing or shortness of breath.   amLODipine (NORVASC) 10 MG tablet Take 10 mg by mouth daily.   aspirin 81 MG tablet Take 81 mg by mouth daily.    BD PEN NEEDLE NANO U/F 32G X 4 MM MISC    budesonide-formoterol (SYMBICORT) 160-4.5 MCG/ACT inhaler Inhale 2 puffs into the lungs 2 (two) times daily.   buPROPion (WELLBUTRIN XL) 300 MG 24 hr tablet Take 150 mg by mouth daily.    Cholecalciferol (VITAMIN D PO) Take 5,000 Units by mouth 2 (two) times daily.    dicyclomine (BENTYL) 20 MG tablet Take 20 mg by mouth every 6 (six) hours.   escitalopram (LEXAPRO) 10 MG tablet Take 10 mg by mouth daily.   famotidine (PEPCID) 40 MG tablet Take 1 tablet (40 mg total) by mouth 2 (two) times daily.   fluticasone (VERAMYST) 27.5 MCG/SPRAY nasal spray Place 2 sprays into the nose daily.   gabapentin (NEURONTIN) 300 MG capsule Take 300 mg by mouth 3 (three) times daily.   hydrALAZINE (APRESOLINE) 25 MG tablet Take 37.5 mg by mouth 3 (three) times daily.   hydrocortisone (ANUSOL-HC) 2.5 % rectal cream Place 1 application rectally 2 (two) times daily.   isosorbide mononitrate (IMDUR) 60 MG 24 hr tablet Take 120 mg by mouth daily.   Lancets (ACCU-CHEK MULTICLIX) lancets    losartan-hydrochlorothiazide (HYZAAR) 100-25 MG tablet Take 1 tablet by mouth daily.   metFORMIN (GLUCOPHAGE) 500 MG tablet Take 500 mg by mouth 2 (two) times daily with a meal.    montelukast (SINGULAIR) 10 MG tablet Take 10 mg by mouth daily.   Multiple Vitamin (MULTIVITAMIN) capsule Take 1 capsule by mouth daily.     nitroGLYCERIN (NITROSTAT) 0.4 MG SL tablet Place 1 tablet (0.4 mg total) under the tongue every 5 (five) minutes x 3 doses as needed for chest pain.   omeprazole (PRILOSEC) 20 MG capsule Take 20 mg by mouth 2 (two) times daily before a meal.    rosuvastatin (CRESTOR) 10 MG tablet TAKE 1 TABLET EVERY DAY    traMADol (ULTRAM) 50 MG tablet Take 50 mg by mouth 2 (two) times daily as needed for pain.   VICTOZA 18 MG/3ML SOPN 12 mg daily.    No facility-administered encounter medications on file as of 10/11/2018.     Allergies: Allergies  Allergen Reactions   Penicillins Rash    Reaction was 30years ago  Has never taken again    Family History: Family History  Problem Relation Age of Onset   Diabetes Sister    Hypertension Daughter     Social History: Social History   Socioeconomic History   Marital status: Single    Spouse name: Not on file   Number of children: Not on file   Years of education: Not on file   Highest education level: Not on file  Occupational History   Not on file  Social Needs   Financial resource strain: Not on file   Food insecurity:    Worry: Not on file    Inability: Not on file   Transportation needs:  Medical: Not on file    Non-medical: Not on file  Tobacco Use   Smoking status: Former Smoker    Packs/day: 1.00    Last attempt to quit: 05/17/2011    Years since quitting: 7.4   Smokeless tobacco: Never Used  Substance and Sexual Activity   Alcohol use: No   Drug use: No   Sexual activity: Never  Lifestyle   Physical activity:    Days per week: Not on file    Minutes per session: Not on file   Stress: Not on file  Relationships   Social connections:    Talks on phone: Not on file    Gets together: Not on file    Attends religious service: Not on file    Active member of club or organization: Not on file    Attends meetings of clubs or organizations: Not on file    Relationship status: Not on file   Intimate partner violence:    Fear of current or ex partner: Not on file    Emotionally abused: Not on file    Physically abused: Not on file    Forced sexual activity: Not on file  Other Topics Concern   Not on file  Social History Narrative   Not on file   Observations/Objective:   Height 5' 5.5" (1.664 m),  weight 170 lb (77.1 kg). No acute distress.  Alert and oriented.  Speech fluent and not dysarthric.  Language intact.  Face symmetric.    Assessment and Plan:   1.  Unilateral headache.   2.  Tinnitus  I am not really sure how to treat this headache as it clearly seems to be triggered by her episodes of dyspnea.  Management would really be focused on optimizing treatment of dyspnea.  Fortunately, it does not last too long and aborts with use of her inhaler.  However, I think she needs imaging of the head to evaluate for any intracranial etiology and extracranial vascular etiology that may be a source of the headache (such as mass lesion or cerebral aneurysm, carotid artery disease), especially given her associated tinnitus.  CT head/CTA of head and neck Further recommendations pending results.  Follow Up Instructions:    -I discussed the assessment and treatment plan with the patient. The patient was provided an opportunity to ask questions and all were answered. The patient agreed with the plan and demonstrated an understanding of the instructions.   The patient was advised to call back or seek an in-person evaluation if the symptoms worsen or if the condition fails to improve as anticipated.   Dudley Major, DO

## 2018-10-12 DIAGNOSIS — E0822 Diabetes mellitus due to underlying condition with diabetic chronic kidney disease: Secondary | ICD-10-CM | POA: Diagnosis not present

## 2018-10-13 DIAGNOSIS — E0822 Diabetes mellitus due to underlying condition with diabetic chronic kidney disease: Secondary | ICD-10-CM | POA: Diagnosis not present

## 2018-10-14 DIAGNOSIS — E0822 Diabetes mellitus due to underlying condition with diabetic chronic kidney disease: Secondary | ICD-10-CM | POA: Diagnosis not present

## 2018-10-15 DIAGNOSIS — E0822 Diabetes mellitus due to underlying condition with diabetic chronic kidney disease: Secondary | ICD-10-CM | POA: Diagnosis not present

## 2018-10-16 DIAGNOSIS — E0822 Diabetes mellitus due to underlying condition with diabetic chronic kidney disease: Secondary | ICD-10-CM | POA: Diagnosis not present

## 2018-10-17 ENCOUNTER — Telehealth: Payer: Self-pay | Admitting: Neurology

## 2018-10-17 DIAGNOSIS — E0822 Diabetes mellitus due to underlying condition with diabetic chronic kidney disease: Secondary | ICD-10-CM | POA: Diagnosis not present

## 2018-10-17 NOTE — Addendum Note (Signed)
Addended by: Jannette Spanner on: 10/17/2018 10:57 AM   Modules accepted: Orders

## 2018-10-17 NOTE — Telephone Encounter (Signed)
Called Pt to advise she will be contacted directly by [place]. The call went to her VM, however, the message indicated to put in a code to retrieve VM messages. Could not leave a message.

## 2018-10-17 NOTE — Telephone Encounter (Signed)
Patient is calling in about a scan she was supposed to have for her head. Please call her back at [phone]. Thanks!

## 2018-10-18 DIAGNOSIS — E559 Vitamin D deficiency, unspecified: Secondary | ICD-10-CM | POA: Diagnosis not present

## 2018-10-18 DIAGNOSIS — E119 Type 2 diabetes mellitus without complications: Secondary | ICD-10-CM | POA: Diagnosis not present

## 2018-10-18 DIAGNOSIS — E0822 Diabetes mellitus due to underlying condition with diabetic chronic kidney disease: Secondary | ICD-10-CM | POA: Diagnosis not present

## 2018-10-18 DIAGNOSIS — K219 Gastro-esophageal reflux disease without esophagitis: Secondary | ICD-10-CM | POA: Diagnosis not present

## 2018-10-18 DIAGNOSIS — I10 Essential (primary) hypertension: Secondary | ICD-10-CM | POA: Diagnosis not present

## 2018-10-18 DIAGNOSIS — Z6827 Body mass index (BMI) 27.0-27.9, adult: Secondary | ICD-10-CM | POA: Diagnosis not present

## 2018-10-18 DIAGNOSIS — F419 Anxiety disorder, unspecified: Secondary | ICD-10-CM | POA: Diagnosis not present

## 2018-10-18 DIAGNOSIS — E785 Hyperlipidemia, unspecified: Secondary | ICD-10-CM | POA: Diagnosis not present

## 2018-10-18 DIAGNOSIS — J45909 Unspecified asthma, uncomplicated: Secondary | ICD-10-CM | POA: Diagnosis not present

## 2018-10-18 DIAGNOSIS — J309 Allergic rhinitis, unspecified: Secondary | ICD-10-CM | POA: Diagnosis not present

## 2018-10-18 DIAGNOSIS — Z79899 Other long term (current) drug therapy: Secondary | ICD-10-CM | POA: Diagnosis not present

## 2018-10-19 ENCOUNTER — Telehealth (INDEPENDENT_AMBULATORY_CARE_PROVIDER_SITE_OTHER): Payer: Medicare HMO | Admitting: Cardiology

## 2018-10-19 ENCOUNTER — Encounter: Payer: Self-pay | Admitting: Cardiology

## 2018-10-19 ENCOUNTER — Other Ambulatory Visit: Payer: Self-pay

## 2018-10-19 VITALS — Ht 65.5 in | Wt 168.0 lb

## 2018-10-19 DIAGNOSIS — E876 Hypokalemia: Secondary | ICD-10-CM | POA: Diagnosis not present

## 2018-10-19 DIAGNOSIS — I350 Nonrheumatic aortic (valve) stenosis: Secondary | ICD-10-CM

## 2018-10-19 DIAGNOSIS — E782 Mixed hyperlipidemia: Secondary | ICD-10-CM

## 2018-10-19 DIAGNOSIS — I2510 Atherosclerotic heart disease of native coronary artery without angina pectoris: Secondary | ICD-10-CM | POA: Diagnosis not present

## 2018-10-19 DIAGNOSIS — E119 Type 2 diabetes mellitus without complications: Secondary | ICD-10-CM

## 2018-10-19 DIAGNOSIS — E0822 Diabetes mellitus due to underlying condition with diabetic chronic kidney disease: Secondary | ICD-10-CM | POA: Diagnosis not present

## 2018-10-19 DIAGNOSIS — I10 Essential (primary) hypertension: Secondary | ICD-10-CM

## 2018-10-19 DIAGNOSIS — E088 Diabetes mellitus due to underlying condition with unspecified complications: Secondary | ICD-10-CM

## 2018-10-19 DIAGNOSIS — G4733 Obstructive sleep apnea (adult) (pediatric): Secondary | ICD-10-CM

## 2018-10-19 NOTE — Progress Notes (Signed)
Virtual Visit via Video Note   This visit type was conducted due to national recommendations for restrictions regarding the COVID-19 Pandemic (e.g. social distancing) in an effort to limit this patient's exposure and mitigate transmission in our community.  Due to her co-morbid illnesses, this patient is at least at moderate risk for complications without adequate follow up.  This format is felt to be most appropriate for this patient at this time.  All issues noted in this document were discussed and addressed.  A limited physical exam was performed with this format.  Please refer to the patient's chart for her consent to telehealth for [facility].   Date:  10/19/2018   ID:  Deborah Hutchinson, DOB 04-Mar-1946, MRN [id number]  Patient Location: Home Provider Location: Office  PCP:  [name], MD  Cardiologist:  No primary care provider on file.  Electrophysiologist:  None   Evaluation Performed:  Follow-Up Visit  Chief Complaint: Coronary artery disease  History of Present Illness:    Deborah Hutchinson is a 73 y.o. female with past medical history of nonobstructive coronary artery disease, essential hypertension, dyslipidemia and diabetes mellitus.  She denies any problems at this time and takes care of activities of daily living.  No chest pain orthopnea or PND.  PRESSION: 1. Coronary calcium score of 593. This was 38 percentile for age and sex matched control.  2. Normal coronary origin with right dominance.  3. Mild to moderate non-obstructive CAD in the LAD and RCA. Additional analysis with CT FFR will be submitted.  4.  Mitral annular calcifications.  IMPRESSIONS    1. The left ventricle has normal systolic function with an ejection fraction of 60-65%. The cavity size was normal. There is moderately increased left ventricular wall thickness. Left ventricular diastolic Doppler parameters are consistent with impaired  relaxation.  2. The right ventricle has normal  systolic function. The cavity was normal. There is no increase in right ventricular wall thickness.  3. Mild thickening of the mitral valve leaflet.  4. The aortic valve is tricuspid. Moderate thickening of the aortic valve. Moderate calcification of the aortic valve. Aortic valve regurgitation was not assessed by color flow Doppler. Mild stenosis of the aortic valve.    The patient does not have symptoms concerning for COVID-19 infection (fever, chills, cough, or new shortness of breath).    Past Medical History:  Diagnosis Date  . Angina    normal stress test  . Asthma   . Bronchitis   . 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 Diabetes mellitus   . Diabetes mellitus due to underlying condition with unspecified complications ([place]) 4/0/9811  . Dyslipidemia 06/10/2015  . Essential hypertension 06/10/2015  . GERD (gastroesophageal reflux disease)   . H/O hiatal hernia   . Heart murmur   . Hyperlipemia   . Hypertension   . Nonspecific (abnormal) findings on radiological and other examination of gastrointestinal tract 05/19/2011  . Pain and swelling of toe of right foot 07/15/2016  . Palpitations 06/02/2016  . Shortness of breath    on  excertion   Past Surgical History:  Procedure Laterality Date  . ABDOMINAL HYSTERECTOMY    . ABDOMINAL HYSTERECTOMY    . BREAST SURGERY     begin mass,left  breast  . COLONOSCOPY  08/17/2015   Colonic polyp status post polypectomy. Mild pancolonic diverticulosis. Highly redundant colon.   Marland Kitchen DILATION AND CURETTAGE OF UTERUS  one  . ESOPHAGOGASTRODUODENOSCOPY  08/17/2015   Moderate hiatal hernia. Otherwise noraml EGD.   . EUS  05/19/2011   Procedure: UPPER ENDOSCOPIC ULTRASOUND (EUS) LINEAR;  Surgeon: Owens Loffler, MD;  Location: [facility] ENDOSCOPY;  Service: Endoscopy;  Laterality: N/A;  radial linear   . HYSTEROTOMY    . KNEE SURGERY    . LEFT HEART CATH AND CORONARY ANGIOGRAPHY     . right thumb     tendon repaire  . SHOULDER SURGERY     rotator cuff repair  . WRIST SURGERY     rigth     Current Meds  Medication Sig  . albuterol (PROVENTIL HFA;VENTOLIN HFA) 108 (90 Base) MCG/ACT inhaler Inhale 2 puffs into the lungs every 6 (six) hours as needed for wheezing or shortness of breath.  Marland Kitchen amLODipine (NORVASC) 10 MG tablet Take 10 mg by mouth daily.  Marland Kitchen aspirin 81 MG tablet Take 81 mg by mouth daily.   . BD PEN NEEDLE NANO U/F 32G X 4 MM MISC   . budesonide-formoterol (SYMBICORT) 160-4.5 MCG/ACT inhaler Inhale 2 puffs into the lungs 2 (two) times daily.  Marland Kitchen buPROPion (WELLBUTRIN XL) 300 MG 24 hr tablet Take 150 mg by mouth daily.   . Cholecalciferol (VITAMIN D PO) Take 5,000 Units by mouth 2 (two) times daily.   Marland Kitchen dicyclomine (BENTYL) 20 MG tablet Take 20 mg by mouth every 6 (six) hours.  Marland Kitchen escitalopram (LEXAPRO) 10 MG tablet Take 10 mg by mouth daily.  . famotidine (PEPCID) 40 MG tablet Take 1 tablet (40 mg total) by mouth 2 (two) times daily.  . fluticasone (VERAMYST) 27.5 MCG/SPRAY nasal spray Place 2 sprays into the nose daily.  Marland Kitchen gabapentin (NEURONTIN) 300 MG capsule Take 300 mg by mouth 3 (three) times daily.  . hydrALAZINE (APRESOLINE) 25 MG tablet Take 37.5 mg by mouth 3 (three) times daily.  . hydrocortisone (ANUSOL-HC) 2.5 % rectal cream Place 1 application rectally 2 (two) times daily.  . isosorbide mononitrate (IMDUR) 60 MG 24 hr tablet Take 120 mg by mouth daily.  . Lancets (ACCU-CHEK MULTICLIX) lancets   . linaclotide (LINZESS) 72 MCG capsule Take 72 mcg by mouth as needed.  Marland Kitchen losartan-hydrochlorothiazide (HYZAAR) 100-25 MG tablet Take 1 tablet by mouth daily.  . metFORMIN (GLUCOPHAGE) 500 MG tablet Take 500 mg by mouth 2 (two) times daily with a meal.   . montelukast (SINGULAIR) 10 MG tablet Take 10 mg by mouth daily.  . Multiple Vitamin (MULTIVITAMIN) capsule Take 1 capsule by mouth daily.    . nitroGLYCERIN (NITROSTAT) 0.4 MG SL tablet Place 1 tablet  (0.4 mg total) under the tongue every 5 (five) minutes x 3 doses as needed for chest pain.  Marland Kitchen omeprazole (PRILOSEC) 20 MG capsule Take 20 mg by mouth 2 (two) times daily before a meal.   . simvastatin (ZOCOR) 10 MG tablet Take 10 mg by mouth daily.  Marland Kitchen tiotropium (SPIRIVA) 18 MCG inhalation capsule Place 18 mcg into inhaler and inhale daily.  . traMADol (ULTRAM) 50 MG tablet Take 50 mg by mouth 2 (two) times daily as needed for pain.  Marland Kitchen VICTOZA 18 MG/3ML SOPN 12 mg daily.      Allergies:   Penicillins   Social History   Tobacco Use  . Smoking status: Former Smoker    Packs/day: 1.00    Quit date: 05/17/2011    Years since quitting: 7.4  . Smokeless tobacco: Never Used  Substance Use Topics  . Alcohol use: No  .  Drug use: No     Family Hx: The patient's family history includes Diabetes in her sister; Hypertension in her daughter.  ROS:   Please see the history of present illness.    As mentioned above All other systems reviewed and are negative.   Prior CV studies:   The following studies were reviewed today:  CT coronary angiography done late last year was unremarkable with no obstructive stenosis  Labs/Other Tests and Data Reviewed:    EKG:  No ECG reviewed.  Recent Labs: 09/24/2018: ALT 20; BUN 8; Creatinine, Ser 0.78; Hemoglobin 12.6; Platelets 350.0; Potassium 2.9; Pro B Natriuretic peptide (BNP) 36.0; Sodium 142; TSH 1.56   Recent Lipid Panel Lab Results  Component Value Date/Time   CHOL 169 03/06/2018 03:10 PM   TRIG 101 03/06/2018 03:10 PM   HDL 88 03/06/2018 03:10 PM   CHOLHDL 1.9 03/06/2018 03:10 PM   LDLCALC 61 03/06/2018 03:10 PM    Wt Readings from Last 3 Encounters:  10/19/18 168 lb (76.2 kg)  10/11/18 170 lb (77.1 kg)  09/24/18 171 lb 12.8 oz (77.9 kg)     Objective:    Vital Signs:  Ht 5' 5.5" (1.664 m)   Wt 168 lb (76.2 kg)   BMI 27.53 kg/m    VITAL SIGNS:  reviewed  ASSESSMENT & PLAN:    1. Coronary artery disease: Secondary  prevention stressed with the patient.  Importance of compliance with diet and medication stressed and she vocalized understanding.  Importance of regular walking stressed. 2. Mild aortic stenosis: Stable at this time.  No intervention 3. Essential hypertension: Her blood pressure needs to be reviewed.  She has a family member with a blood pressure machine she will get it checked and get in touch with [place] 4. Mixed dyslipidemia and diabetes mellitus: Diet was discussed.  She recently had blood work by her primary care physician and we will try her to obtain a copy of this 5. Hypokalemia: Addressed by primary care physician.  She is doing potassium supplementation and had blood work done yesterday. 6. Patient will be seen in follow-up appointment in 3 months or earlier if the patient has any concerns   COVID-19 Education: The signs and symptoms of COVID-19 were discussed with the patient and how to seek care for testing (follow up with PCP or arrange E-visit).  The importance of social distancing was discussed today.  Time:   Today, I have spent  minutes with the patient with telehealth technology discussing the above problems.     Medication Adjustments/Labs and Tests Ordered: Current medicines are reviewed at length with the patient today.  Concerns regarding medicines are outlined above.   Tests Ordered: No orders of the defined types were placed in this encounter.   Medication Changes: No orders of the defined types were placed in this encounter.   Follow Up:  In Person in 3 month(s)  Signed, [name], MD  10/19/2018 9:43 AM    [place]

## 2018-10-19 NOTE — Patient Instructions (Signed)

## 2018-10-20 DIAGNOSIS — E0822 Diabetes mellitus due to underlying condition with diabetic chronic kidney disease: Secondary | ICD-10-CM | POA: Diagnosis not present

## 2018-10-21 DIAGNOSIS — E0822 Diabetes mellitus due to underlying condition with diabetic chronic kidney disease: Secondary | ICD-10-CM | POA: Diagnosis not present

## 2018-10-22 ENCOUNTER — Encounter: Payer: Self-pay | Admitting: Cardiology

## 2018-10-22 DIAGNOSIS — G5793 Unspecified mononeuropathy of bilateral lower limbs: Secondary | ICD-10-CM | POA: Diagnosis not present

## 2018-10-22 DIAGNOSIS — I10 Essential (primary) hypertension: Secondary | ICD-10-CM | POA: Diagnosis not present

## 2018-10-22 DIAGNOSIS — R51 Headache: Secondary | ICD-10-CM | POA: Diagnosis not present

## 2018-10-22 DIAGNOSIS — M1990 Unspecified osteoarthritis, unspecified site: Secondary | ICD-10-CM | POA: Diagnosis not present

## 2018-10-23 ENCOUNTER — Telehealth (INDEPENDENT_AMBULATORY_CARE_PROVIDER_SITE_OTHER): Payer: Medicare HMO | Admitting: Gastroenterology

## 2018-10-23 ENCOUNTER — Encounter: Payer: Self-pay | Admitting: Gastroenterology

## 2018-10-23 ENCOUNTER — Other Ambulatory Visit: Payer: Self-pay

## 2018-10-23 VITALS — Ht 65.5 in | Wt 168.0 lb

## 2018-10-23 DIAGNOSIS — G8929 Other chronic pain: Secondary | ICD-10-CM

## 2018-10-23 DIAGNOSIS — K5900 Constipation, unspecified: Secondary | ICD-10-CM

## 2018-10-23 DIAGNOSIS — R1013 Epigastric pain: Secondary | ICD-10-CM

## 2018-10-23 DIAGNOSIS — K219 Gastro-esophageal reflux disease without esophagitis: Secondary | ICD-10-CM | POA: Diagnosis not present

## 2018-10-23 DIAGNOSIS — R1032 Left lower quadrant pain: Secondary | ICD-10-CM | POA: Diagnosis not present

## 2018-10-23 MED ORDER — SUPREP BOWEL PREP KIT 17.5-3.13-1.6 GM/177ML PO SOLN: 1.0000 | ORAL | 0 refills | Status: DC

## 2018-10-23 NOTE — Progress Notes (Signed)
IMPRESSION and PLAN:    #1. LLQ pain. ? Etiology. Neg CT abdo/pel 02/2018. Has prev treated empirically with doxycycline for presumed acute diverticulitis. Colon 08/2015 colonic polyp s/p polypectomy (TA), mild pancolonic div. #2. Epigastric pain with postprandial fullness.   #3. Chronic constipation (likely exacerbated by medications including calcium channel blockers and Cox 2 inhibitors). #4. GERD with mod HH (EGD 08/2015). #5.  History of colonic polyps.  Plan: - Continue Linzess 72 mcg po QOD (every day causes her to have diarrhea). - Proceed with EGD/colon with 2 day prep (miralax and suprep). Discussed risks & benefits. (Risks including rare perforation req laparotomy, bleeding after biopsies/polypectomy req blood transfusion, rare chance of missing neoplasms, risks of anesthesia/sedation). Benefits outweigh the risks. Patient agrees to proceed. All the questions were answered. Consent forms given for review. - If still with problems, proceed with [place], then HIDA with EF followed by solid-phase gastric emptying scan. - Continue omeprazole 20 mg p.o. twice daily as before.  Change Zantac to 300 mg nightly - FU after above.      HPI:    Chief Complaint:   Deborah Hutchinson is a 73 y.o. female  For follow-up visit. Seen by Dr Rica Records. Advised GI eval.  Having postprandial upper abdominal pain especially after eating. Denies having any heartburn, nausea/vomiting.  No odynophagia or dysphagia. Denies excessive burping. Some abdominal bloating.  Continues to have left lower quadrant abdominal pain.  Negative CT scan Abdo/pelvis 02/09/2018 except for stable left adrenal adenoma.  Had left lower quadrant abdominal pain but has been having hip pain as well on the left side status post steroid shots in the past.  Currently getting steroid patch  Constipation but better with Linzess which now she has been using it 2-3 times per week.Marland Kitchen  She would have diarrhea if she takes it every day.   No fever or chills.  Denies having any melena or hematochezia.  No weight loss.  Taking care of grandbaby.  Daughter having problems with arthritis.  Past GI procedures: -EGD 08/17/2015 moderate hiatal hernia, negative small bowel biopsies -Colonoscopy 08/17/2015 8 mm sessile colon polyp s/p polypectomy, mild pancolonic diverticulosis, highly redundant colon.  Biopsies tubular adenoma.  Next due 08/2020.  Earlier, if with any new problems or continued problems. -CT abdo/pel with contrast 02/09/2018: Stable left adrenal adenoma, small fat-containing right inguinal hernia.  Aortic atherosclerosis.  Normal gallbladder.  Normal pancreas. Past Medical History:  Diagnosis Date  . Angina    normal stress test  . Asthma   . Bronchitis   . 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 Diabetes mellitus   . Diabetes mellitus due to underlying condition with unspecified complications ([place]) 12/08/4479  . Dyslipidemia 06/10/2015  . Essential hypertension 06/10/2015  . GERD (gastroesophageal reflux disease)   . H/O hiatal hernia   . Heart murmur   . Hyperlipemia   . Hypertension   . Nonspecific (abnormal) findings on radiological and other examination of gastrointestinal tract 05/19/2011  . Pain and swelling of toe of right foot 07/15/2016  . Palpitations 06/02/2016  . Shortness of breath    on  excertion    Current Outpatient Medications  Medication Sig Dispense Refill  . albuterol (PROVENTIL HFA;VENTOLIN HFA) 108 (90 Base) MCG/ACT inhaler Inhale 2 puffs into the lungs every 6 (six) hours as needed for wheezing or shortness of breath. 3 Inhaler 3  . amLODipine (NORVASC) 10 MG tablet Take  10 mg by mouth daily.    Marland Kitchen aspirin 81 MG tablet Take 81 mg by mouth daily.     . BD PEN NEEDLE NANO U/F 32G X 4 MM MISC     . budesonide-formoterol (SYMBICORT) 160-4.5 MCG/ACT inhaler Inhale 2 puffs into the lungs 2 (two) times daily. 3 Inhaler 3  .  buPROPion (WELLBUTRIN XL) 300 MG 24 hr tablet Take 150 mg by mouth daily.     . Cholecalciferol (VITAMIN D PO) Take 5,000 Units by mouth 2 (two) times daily.     Marland Kitchen dicyclomine (BENTYL) 20 MG tablet Take 20 mg by mouth every 6 (six) hours.    Marland Kitchen escitalopram (LEXAPRO) 10 MG tablet Take 10 mg by mouth daily.    . famotidine (PEPCID) 40 MG tablet Take 1 tablet (40 mg total) by mouth 2 (two) times daily. 180 tablet 3  . fluticasone (VERAMYST) 27.5 MCG/SPRAY nasal spray Place 2 sprays into the nose daily.    Marland Kitchen gabapentin (NEURONTIN) 300 MG capsule Take 300 mg by mouth 3 (three) times daily.    . hydrALAZINE (APRESOLINE) 25 MG tablet Take 37.5 mg by mouth 3 (three) times daily.    . hydrocortisone (ANUSOL-HC) 2.5 % rectal cream Place 1 application rectally 2 (two) times daily. 30 g 1  . isosorbide mononitrate (IMDUR) 60 MG 24 hr tablet Take 120 mg by mouth daily.    . Lancets (ACCU-CHEK MULTICLIX) lancets     . linaclotide (LINZESS) 72 MCG capsule Take 72 mcg by mouth as needed.    Marland Kitchen losartan-hydrochlorothiazide (HYZAAR) 100-25 MG tablet Take 1 tablet by mouth daily.    . metFORMIN (GLUCOPHAGE) 500 MG tablet Take 500 mg by mouth 2 (two) times daily with a meal.     . montelukast (SINGULAIR) 10 MG tablet Take 10 mg by mouth daily.    . Multiple Vitamin (MULTIVITAMIN) capsule Take 1 capsule by mouth daily.      . nitroGLYCERIN (NITROSTAT) 0.4 MG SL tablet Place 1 tablet (0.4 mg total) under the tongue every 5 (five) minutes x 3 doses as needed for chest pain. 25 tablet 11  . omeprazole (PRILOSEC) 20 MG capsule Take 20 mg by mouth 2 (two) times daily before a meal.     . simvastatin (ZOCOR) 10 MG tablet Take 10 mg by mouth daily.    Marland Kitchen tiotropium (SPIRIVA) 18 MCG inhalation capsule Place 18 mcg into inhaler and inhale daily.    . traMADol (ULTRAM) 50 MG tablet Take 50 mg by mouth 2 (two) times daily as needed for pain.    Marland Kitchen VICTOZA 18 MG/3ML SOPN 12 mg daily.      No current facility-administered  medications for this visit.     Past Surgical History:  Procedure Laterality Date  . ABDOMINAL HYSTERECTOMY    . ABDOMINAL HYSTERECTOMY    . BREAST SURGERY     begin mass,left  breast  . COLONOSCOPY  08/17/2015   Colonic polyp status post polypectomy. Mild pancolonic diverticulosis. Highly redundant colon.   Marland Kitchen DILATION AND CURETTAGE OF UTERUS     one  . ESOPHAGOGASTRODUODENOSCOPY  08/17/2015   Moderate hiatal hernia. Otherwise noraml EGD.   . EUS  05/19/2011   Procedure: UPPER ENDOSCOPIC ULTRASOUND (EUS) LINEAR;  Surgeon: Owens Loffler, MD;  Location: [facility];  Service: Endoscopy;  Laterality: N/A;  radial linear   . HYSTEROTOMY    . KNEE SURGERY    . LEFT HEART CATH AND CORONARY ANGIOGRAPHY    . right  thumb     tendon repaire  . SHOULDER SURGERY     rotator cuff repair  . WRIST SURGERY     rigth    Family History  Problem Relation Age of Onset  . Diabetes Sister   . Hypertension Daughter   . Colon cancer Neg Hx     Social History   Tobacco Use  . Smoking status: Former Smoker    Packs/day: 1.00    Quit date: 05/17/2011    Years since quitting: 7.4  . Smokeless tobacco: Never Used  Substance Use Topics  . Alcohol use: No  . Drug use: No    Allergies  Allergen Reactions  . Penicillins Rash    Reaction was 30years ago  Has never taken again     Review of Systems: All systems reviewed and negative except where noted in HPI.    Physical Exam:     Ht 5' 5.5" (1.664 m)   Wt 168 lb (76.2 kg)   BMI 27.53 kg/m  televisit  This service was provided via telemedicine.  The patient was located at home.  The provider was located in office.  The patient did consent to this telephone visit and is aware of possible charges through their insurance for this visit.  The patient was referred by Dr Rica Records.  Time spent on call/coordination of care: 25 min     Briany Aye,MD 10/23/2018, 9:52 AM   CC [name], MD

## 2018-10-23 NOTE — Addendum Note (Signed)
Addended by: Karena Addison on: 10/23/2018 10:24 AM   Modules accepted: Orders

## 2018-10-23 NOTE — Telephone Encounter (Signed)
Called and spoke with Pt. Provided her with the telephone number to [facility], went over what kind of imaging tests were ordered. Pt will call to schedule.

## 2018-10-23 NOTE — Patient Instructions (Signed)
If you are age 75 or older, your body mass index should be between 23-30. Your Body mass index is 27.53 kg/m. If this is out of the aforementioned range listed, please consider follow up with your Primary Care Provider.  If you are age 42 or younger, your body mass index should be between 19-25. Your Body mass index is 27.53 kg/m. If this is out of the aformentioned range listed, please consider follow up with your Primary Care Provider.   We have sent the following medications to your pharmacy for you to pick up at your convenience: Suprep  Two days before your procedure: Mix 3 packs (or capfuls) of Miralax in 48 ounces of clear liquid and drink at 6pm.  Continue Linzess Continue Omeprazole  You have been scheduled for an endoscopy and colonoscopy. Please follow the written instructions given to you at your visit today. Please pick up your prep supplies at the pharmacy within the next 1-3 days. If you use inhalers (even only as needed), please bring them with you on the day of your procedure. Your physician has requested that you go to www.startemmi.com and enter the access code given to you at your visit today. This web site gives a general overview about your procedure. However, you should still follow specific instructions given to you by our office regarding your preparation for the procedure.  To help prevent the possible spread of infection to our patients, communities, and staff; we will be implementing the following measures:  As of now we are not allowing any visitors/family members to accompany you to any upcoming appointments with [facility]. If you have any concerns about this please contact our office to discuss prior to the appointment.   Thank you,  Dr. Jackquline Denmark

## 2018-10-26 DIAGNOSIS — Z1339 Encounter for screening examination for other mental health and behavioral disorders: Secondary | ICD-10-CM | POA: Diagnosis not present

## 2018-10-26 DIAGNOSIS — Z1331 Encounter for screening for depression: Secondary | ICD-10-CM | POA: Diagnosis not present

## 2018-10-26 DIAGNOSIS — Z0000 Encounter for general adult medical examination without abnormal findings: Secondary | ICD-10-CM | POA: Diagnosis not present

## 2018-10-26 DIAGNOSIS — E785 Hyperlipidemia, unspecified: Secondary | ICD-10-CM | POA: Diagnosis not present

## 2018-10-26 DIAGNOSIS — Z9181 History of falling: Secondary | ICD-10-CM | POA: Diagnosis not present

## 2018-10-31 DIAGNOSIS — D473 Essential (hemorrhagic) thrombocythemia: Secondary | ICD-10-CM | POA: Diagnosis not present

## 2018-10-31 DIAGNOSIS — D509 Iron deficiency anemia, unspecified: Secondary | ICD-10-CM | POA: Diagnosis not present

## 2018-10-31 DIAGNOSIS — S90122A Contusion of left lesser toe(s) without damage to nail, initial encounter: Secondary | ICD-10-CM | POA: Diagnosis not present

## 2018-11-05 DIAGNOSIS — E0822 Diabetes mellitus due to underlying condition with diabetic chronic kidney disease: Secondary | ICD-10-CM | POA: Diagnosis not present

## 2018-11-06 DIAGNOSIS — E0822 Diabetes mellitus due to underlying condition with diabetic chronic kidney disease: Secondary | ICD-10-CM | POA: Diagnosis not present

## 2018-11-07 DIAGNOSIS — E0822 Diabetes mellitus due to underlying condition with diabetic chronic kidney disease: Secondary | ICD-10-CM | POA: Diagnosis not present

## 2018-11-08 ENCOUNTER — Other Ambulatory Visit: Payer: Medicare HMO

## 2018-11-08 DIAGNOSIS — E0822 Diabetes mellitus due to underlying condition with diabetic chronic kidney disease: Secondary | ICD-10-CM | POA: Diagnosis not present

## 2018-11-09 DIAGNOSIS — E0822 Diabetes mellitus due to underlying condition with diabetic chronic kidney disease: Secondary | ICD-10-CM | POA: Diagnosis not present

## 2018-11-10 DIAGNOSIS — E0822 Diabetes mellitus due to underlying condition with diabetic chronic kidney disease: Secondary | ICD-10-CM | POA: Diagnosis not present

## 2018-11-11 DIAGNOSIS — E0822 Diabetes mellitus due to underlying condition with diabetic chronic kidney disease: Secondary | ICD-10-CM | POA: Diagnosis not present

## 2018-11-12 ENCOUNTER — Ambulatory Visit
Admission: RE | Admit: 2018-11-12 | Discharge: 2018-11-12 | Disposition: A | Discharge status: Home / Self Care | Payer: Medicare HMO | Source: Ambulatory Visit | Attending: Neurology | Admitting: Neurology

## 2018-11-12 ENCOUNTER — Other Ambulatory Visit: Payer: Self-pay

## 2018-11-12 DIAGNOSIS — R519 Headache, unspecified: Secondary | ICD-10-CM

## 2018-11-12 DIAGNOSIS — E0822 Diabetes mellitus due to underlying condition with diabetic chronic kidney disease: Secondary | ICD-10-CM | POA: Diagnosis not present

## 2018-11-12 DIAGNOSIS — I6523 Occlusion and stenosis of bilateral carotid arteries: Secondary | ICD-10-CM | POA: Diagnosis not present

## 2018-11-12 DIAGNOSIS — I6782 Cerebral ischemia: Secondary | ICD-10-CM | POA: Diagnosis not present

## 2018-11-12 DIAGNOSIS — H9313 Tinnitus, bilateral: Secondary | ICD-10-CM

## 2018-11-12 MED ORDER — IOPAMIDOL (ISOVUE-370) INJECTION 76%
75.0000 mL | Freq: Once | INTRAVENOUS | Status: AC | PRN
  Administered 2018-11-12: 75 mL via INTRAVENOUS

## 2018-11-13 DIAGNOSIS — S92352A Displaced fracture of fifth metatarsal bone, left foot, initial encounter for closed fracture: Secondary | ICD-10-CM | POA: Diagnosis not present

## 2018-11-13 DIAGNOSIS — E0822 Diabetes mellitus due to underlying condition with diabetic chronic kidney disease: Secondary | ICD-10-CM | POA: Diagnosis not present

## 2018-11-13 DIAGNOSIS — S80212A Abrasion, left knee, initial encounter: Secondary | ICD-10-CM | POA: Diagnosis not present

## 2018-11-14 DIAGNOSIS — E0822 Diabetes mellitus due to underlying condition with diabetic chronic kidney disease: Secondary | ICD-10-CM | POA: Diagnosis not present

## 2018-11-15 DIAGNOSIS — E119 Type 2 diabetes mellitus without complications: Secondary | ICD-10-CM | POA: Diagnosis not present

## 2018-11-15 DIAGNOSIS — Z8781 Personal history of (healed) traumatic fracture: Secondary | ICD-10-CM | POA: Diagnosis not present

## 2018-11-15 DIAGNOSIS — E0822 Diabetes mellitus due to underlying condition with diabetic chronic kidney disease: Secondary | ICD-10-CM | POA: Diagnosis not present

## 2018-11-15 DIAGNOSIS — R2681 Unsteadiness on feet: Secondary | ICD-10-CM | POA: Diagnosis not present

## 2018-11-15 DIAGNOSIS — R109 Unspecified abdominal pain: Secondary | ICD-10-CM | POA: Diagnosis not present

## 2018-11-15 DIAGNOSIS — W19XXXA Unspecified fall, initial encounter: Secondary | ICD-10-CM | POA: Diagnosis not present

## 2018-11-16 DIAGNOSIS — E0822 Diabetes mellitus due to underlying condition with diabetic chronic kidney disease: Secondary | ICD-10-CM | POA: Diagnosis not present

## 2018-11-17 DIAGNOSIS — E0822 Diabetes mellitus due to underlying condition with diabetic chronic kidney disease: Secondary | ICD-10-CM | POA: Diagnosis not present

## 2018-11-18 DIAGNOSIS — F419 Anxiety disorder, unspecified: Secondary | ICD-10-CM | POA: Diagnosis not present

## 2018-11-18 DIAGNOSIS — F329 Major depressive disorder, single episode, unspecified: Secondary | ICD-10-CM | POA: Diagnosis not present

## 2018-11-18 DIAGNOSIS — R270 Ataxia, unspecified: Secondary | ICD-10-CM | POA: Diagnosis not present

## 2018-11-18 DIAGNOSIS — J309 Allergic rhinitis, unspecified: Secondary | ICD-10-CM | POA: Diagnosis not present

## 2018-11-18 DIAGNOSIS — K219 Gastro-esophageal reflux disease without esophagitis: Secondary | ICD-10-CM | POA: Diagnosis not present

## 2018-11-18 DIAGNOSIS — I10 Essential (primary) hypertension: Secondary | ICD-10-CM | POA: Diagnosis not present

## 2018-11-18 DIAGNOSIS — E0822 Diabetes mellitus due to underlying condition with diabetic chronic kidney disease: Secondary | ICD-10-CM | POA: Diagnosis not present

## 2018-11-18 DIAGNOSIS — S62102D Fracture of unspecified carpal bone, left wrist, subsequent encounter for fracture with routine healing: Secondary | ICD-10-CM | POA: Diagnosis not present

## 2018-11-18 DIAGNOSIS — M1990 Unspecified osteoarthritis, unspecified site: Secondary | ICD-10-CM | POA: Diagnosis not present

## 2018-11-18 DIAGNOSIS — E1140 Type 2 diabetes mellitus with diabetic neuropathy, unspecified: Secondary | ICD-10-CM | POA: Diagnosis not present

## 2018-11-19 DIAGNOSIS — S62331A Displaced fracture of neck of second metacarpal bone, left hand, initial encounter for closed fracture: Secondary | ICD-10-CM | POA: Diagnosis not present

## 2018-11-19 DIAGNOSIS — S62337A Displaced fracture of neck of fifth metacarpal bone, left hand, initial encounter for closed fracture: Secondary | ICD-10-CM | POA: Diagnosis not present

## 2018-11-20 ENCOUNTER — Encounter: Payer: Medicare HMO | Admitting: Gastroenterology

## 2018-11-21 ENCOUNTER — Telehealth: Payer: Self-pay | Admitting: Neurology

## 2018-11-21 NOTE — Telephone Encounter (Signed)
Patient is calling in needing results for the CT Scan. Thanks!

## 2018-11-21 NOTE — Telephone Encounter (Signed)
Notes recorded by Pieter Partridge, DO on 11/13/2018 at 9:40 AM EDT  Some atherosclerosis in the blood vessels of the head and neck but no significant abnormalities to explain headache and ringing in the ears.  Called and spoke with Pt, advised her of CT results. I apologized to her for not contacting her before now. Apparently there was a My Chart message that did not send.

## 2018-11-22 DIAGNOSIS — F329 Major depressive disorder, single episode, unspecified: Secondary | ICD-10-CM | POA: Diagnosis not present

## 2018-11-22 DIAGNOSIS — R270 Ataxia, unspecified: Secondary | ICD-10-CM | POA: Diagnosis not present

## 2018-11-22 DIAGNOSIS — S62102D Fracture of unspecified carpal bone, left wrist, subsequent encounter for fracture with routine healing: Secondary | ICD-10-CM | POA: Diagnosis not present

## 2018-11-22 DIAGNOSIS — M1990 Unspecified osteoarthritis, unspecified site: Secondary | ICD-10-CM | POA: Diagnosis not present

## 2018-11-22 DIAGNOSIS — K219 Gastro-esophageal reflux disease without esophagitis: Secondary | ICD-10-CM | POA: Diagnosis not present

## 2018-11-22 DIAGNOSIS — I10 Essential (primary) hypertension: Secondary | ICD-10-CM | POA: Diagnosis not present

## 2018-11-22 DIAGNOSIS — J309 Allergic rhinitis, unspecified: Secondary | ICD-10-CM | POA: Diagnosis not present

## 2018-11-22 DIAGNOSIS — E1140 Type 2 diabetes mellitus with diabetic neuropathy, unspecified: Secondary | ICD-10-CM | POA: Diagnosis not present

## 2018-11-22 DIAGNOSIS — F419 Anxiety disorder, unspecified: Secondary | ICD-10-CM | POA: Diagnosis not present

## 2018-11-26 DIAGNOSIS — K219 Gastro-esophageal reflux disease without esophagitis: Secondary | ICD-10-CM | POA: Diagnosis not present

## 2018-11-26 DIAGNOSIS — S62102D Fracture of unspecified carpal bone, left wrist, subsequent encounter for fracture with routine healing: Secondary | ICD-10-CM | POA: Diagnosis not present

## 2018-11-26 DIAGNOSIS — E1140 Type 2 diabetes mellitus with diabetic neuropathy, unspecified: Secondary | ICD-10-CM | POA: Diagnosis not present

## 2018-11-26 DIAGNOSIS — I10 Essential (primary) hypertension: Secondary | ICD-10-CM | POA: Diagnosis not present

## 2018-11-26 DIAGNOSIS — F329 Major depressive disorder, single episode, unspecified: Secondary | ICD-10-CM | POA: Diagnosis not present

## 2018-11-26 DIAGNOSIS — J309 Allergic rhinitis, unspecified: Secondary | ICD-10-CM | POA: Diagnosis not present

## 2018-11-26 DIAGNOSIS — R270 Ataxia, unspecified: Secondary | ICD-10-CM | POA: Diagnosis not present

## 2018-11-26 DIAGNOSIS — M1990 Unspecified osteoarthritis, unspecified site: Secondary | ICD-10-CM | POA: Diagnosis not present

## 2018-11-26 DIAGNOSIS — F419 Anxiety disorder, unspecified: Secondary | ICD-10-CM | POA: Diagnosis not present

## 2018-11-27 ENCOUNTER — Telehealth: Payer: Self-pay | Admitting: Gastroenterology

## 2018-11-27 DIAGNOSIS — E1140 Type 2 diabetes mellitus with diabetic neuropathy, unspecified: Secondary | ICD-10-CM | POA: Diagnosis not present

## 2018-11-27 DIAGNOSIS — R112 Nausea with vomiting, unspecified: Secondary | ICD-10-CM | POA: Diagnosis not present

## 2018-11-27 NOTE — Telephone Encounter (Signed)
Please move her to a sooner date Thanks Rg

## 2018-11-27 NOTE — Telephone Encounter (Signed)
Patient has not been contacted yet-does she need to have an earlier procedure? Please advise

## 2018-11-27 NOTE — Telephone Encounter (Signed)
Attempted to reach patient-mailbox full/not set up; unable to leave a message for the patient; will attempt to reach patient at a later date/time;

## 2018-11-27 NOTE — Telephone Encounter (Signed)
Pt called to let Dr. Lyndel Safe know that she has been vomiting in her sleep, she is scheduled for a procedure on 8/17.

## 2018-11-28 DIAGNOSIS — S62332A Displaced fracture of neck of third metacarpal bone, right hand, initial encounter for closed fracture: Secondary | ICD-10-CM | POA: Diagnosis not present

## 2018-11-28 DIAGNOSIS — K219 Gastro-esophageal reflux disease without esophagitis: Secondary | ICD-10-CM | POA: Diagnosis not present

## 2018-11-28 DIAGNOSIS — M1990 Unspecified osteoarthritis, unspecified site: Secondary | ICD-10-CM | POA: Diagnosis not present

## 2018-11-28 DIAGNOSIS — R270 Ataxia, unspecified: Secondary | ICD-10-CM | POA: Diagnosis not present

## 2018-11-28 DIAGNOSIS — F329 Major depressive disorder, single episode, unspecified: Secondary | ICD-10-CM | POA: Diagnosis not present

## 2018-11-28 DIAGNOSIS — J309 Allergic rhinitis, unspecified: Secondary | ICD-10-CM | POA: Diagnosis not present

## 2018-11-28 DIAGNOSIS — S62337D Displaced fracture of neck of fifth metacarpal bone, left hand, subsequent encounter for fracture with routine healing: Secondary | ICD-10-CM | POA: Diagnosis not present

## 2018-11-28 DIAGNOSIS — F419 Anxiety disorder, unspecified: Secondary | ICD-10-CM | POA: Diagnosis not present

## 2018-11-28 DIAGNOSIS — E1140 Type 2 diabetes mellitus with diabetic neuropathy, unspecified: Secondary | ICD-10-CM | POA: Diagnosis not present

## 2018-11-28 DIAGNOSIS — S62102D Fracture of unspecified carpal bone, left wrist, subsequent encounter for fracture with routine healing: Secondary | ICD-10-CM | POA: Diagnosis not present

## 2018-11-28 DIAGNOSIS — I10 Essential (primary) hypertension: Secondary | ICD-10-CM | POA: Diagnosis not present

## 2018-11-29 DIAGNOSIS — M545 Low back pain: Secondary | ICD-10-CM | POA: Diagnosis not present

## 2018-11-29 NOTE — Telephone Encounter (Signed)
Unable to reach patient -Left message for patient to return call to the office; will attempt to reach patient again at a later date/time;   Patient's EGD/colon procedure appt has been moved to 12/11/2018 arrival at 3:00 pm with procedure at 4:00 pm; revised prep instructions have been sent via MyChart and have also been printed and mailed to patient;

## 2018-11-30 ENCOUNTER — Telehealth: Payer: Self-pay | Admitting: Neurology

## 2018-11-30 NOTE — Telephone Encounter (Signed)
error 

## 2018-11-30 NOTE — Telephone Encounter (Signed)
Called and spoke with Pt. She does not remember speaking with me and [place] discussing the imaging results. We went over the results again, I desribed what atherosclerosis is, and it's know causes. The Pt seemed satisfied with the explanation and I answered all her questions.

## 2018-11-30 NOTE — Progress Notes (Signed)
Corrected prep instructions

## 2018-11-30 NOTE — Telephone Encounter (Signed)
Patient returned call to the office- patient reports she cannot have her procedure on 12/11/2018 as transportation is an issue; patient was rescheduled for her procedure on 12/14/2018 at 3:30 pm and prep instructions were revised and mailed to the patient and sent via [place]; patient advised to call back to the office should questions/concerns arise;

## 2018-11-30 NOTE — Telephone Encounter (Signed)
Follow up  Patient verbalized she would like to speak to nurse in regards to CT she had beginning of this month.  Advised patient nurse called and went over results of her CT but I will send message back for nurse to give her a call.  Patient also verbalized she wanted to speak to MD and advised patient the nurse will relay message to Dr. Tomi Likens.  Please f/u with patient

## 2018-12-04 DIAGNOSIS — R270 Ataxia, unspecified: Secondary | ICD-10-CM | POA: Diagnosis not present

## 2018-12-04 DIAGNOSIS — J309 Allergic rhinitis, unspecified: Secondary | ICD-10-CM | POA: Diagnosis not present

## 2018-12-04 DIAGNOSIS — K219 Gastro-esophageal reflux disease without esophagitis: Secondary | ICD-10-CM | POA: Diagnosis not present

## 2018-12-04 DIAGNOSIS — F329 Major depressive disorder, single episode, unspecified: Secondary | ICD-10-CM | POA: Diagnosis not present

## 2018-12-04 DIAGNOSIS — I10 Essential (primary) hypertension: Secondary | ICD-10-CM | POA: Diagnosis not present

## 2018-12-04 DIAGNOSIS — E1140 Type 2 diabetes mellitus with diabetic neuropathy, unspecified: Secondary | ICD-10-CM | POA: Diagnosis not present

## 2018-12-04 DIAGNOSIS — M1990 Unspecified osteoarthritis, unspecified site: Secondary | ICD-10-CM | POA: Diagnosis not present

## 2018-12-04 DIAGNOSIS — F419 Anxiety disorder, unspecified: Secondary | ICD-10-CM | POA: Diagnosis not present

## 2018-12-04 DIAGNOSIS — S62102D Fracture of unspecified carpal bone, left wrist, subsequent encounter for fracture with routine healing: Secondary | ICD-10-CM | POA: Diagnosis not present

## 2018-12-05 DIAGNOSIS — S62102D Fracture of unspecified carpal bone, left wrist, subsequent encounter for fracture with routine healing: Secondary | ICD-10-CM | POA: Diagnosis not present

## 2018-12-05 DIAGNOSIS — M4316 Spondylolisthesis, lumbar region: Secondary | ICD-10-CM | POA: Diagnosis not present

## 2018-12-05 DIAGNOSIS — F329 Major depressive disorder, single episode, unspecified: Secondary | ICD-10-CM | POA: Diagnosis not present

## 2018-12-05 DIAGNOSIS — J309 Allergic rhinitis, unspecified: Secondary | ICD-10-CM | POA: Diagnosis not present

## 2018-12-05 DIAGNOSIS — M48061 Spinal stenosis, lumbar region without neurogenic claudication: Secondary | ICD-10-CM | POA: Diagnosis not present

## 2018-12-05 DIAGNOSIS — R270 Ataxia, unspecified: Secondary | ICD-10-CM | POA: Diagnosis not present

## 2018-12-05 DIAGNOSIS — M1990 Unspecified osteoarthritis, unspecified site: Secondary | ICD-10-CM | POA: Diagnosis not present

## 2018-12-05 DIAGNOSIS — M79651 Pain in right thigh: Secondary | ICD-10-CM | POA: Diagnosis not present

## 2018-12-05 DIAGNOSIS — I10 Essential (primary) hypertension: Secondary | ICD-10-CM | POA: Diagnosis not present

## 2018-12-05 DIAGNOSIS — M25551 Pain in right hip: Secondary | ICD-10-CM | POA: Diagnosis not present

## 2018-12-05 DIAGNOSIS — M5136 Other intervertebral disc degeneration, lumbar region: Secondary | ICD-10-CM | POA: Diagnosis not present

## 2018-12-05 DIAGNOSIS — M79652 Pain in left thigh: Secondary | ICD-10-CM | POA: Diagnosis not present

## 2018-12-05 DIAGNOSIS — E1140 Type 2 diabetes mellitus with diabetic neuropathy, unspecified: Secondary | ICD-10-CM | POA: Diagnosis not present

## 2018-12-05 DIAGNOSIS — M47816 Spondylosis without myelopathy or radiculopathy, lumbar region: Secondary | ICD-10-CM | POA: Diagnosis not present

## 2018-12-05 DIAGNOSIS — M25552 Pain in left hip: Secondary | ICD-10-CM | POA: Diagnosis not present

## 2018-12-05 DIAGNOSIS — K219 Gastro-esophageal reflux disease without esophagitis: Secondary | ICD-10-CM | POA: Diagnosis not present

## 2018-12-05 DIAGNOSIS — F419 Anxiety disorder, unspecified: Secondary | ICD-10-CM | POA: Diagnosis not present

## 2018-12-06 DIAGNOSIS — M545 Low back pain: Secondary | ICD-10-CM | POA: Diagnosis not present

## 2018-12-06 DIAGNOSIS — R270 Ataxia, unspecified: Secondary | ICD-10-CM | POA: Diagnosis not present

## 2018-12-06 DIAGNOSIS — E1140 Type 2 diabetes mellitus with diabetic neuropathy, unspecified: Secondary | ICD-10-CM | POA: Diagnosis not present

## 2018-12-06 DIAGNOSIS — E669 Obesity, unspecified: Secondary | ICD-10-CM | POA: Diagnosis not present

## 2018-12-06 DIAGNOSIS — Z6830 Body mass index (BMI) 30.0-30.9, adult: Secondary | ICD-10-CM | POA: Diagnosis not present

## 2018-12-06 DIAGNOSIS — E785 Hyperlipidemia, unspecified: Secondary | ICD-10-CM | POA: Diagnosis not present

## 2018-12-06 DIAGNOSIS — E876 Hypokalemia: Secondary | ICD-10-CM | POA: Diagnosis not present

## 2018-12-09 DIAGNOSIS — F329 Major depressive disorder, single episode, unspecified: Secondary | ICD-10-CM | POA: Diagnosis not present

## 2018-12-09 DIAGNOSIS — R270 Ataxia, unspecified: Secondary | ICD-10-CM | POA: Diagnosis not present

## 2018-12-09 DIAGNOSIS — E1140 Type 2 diabetes mellitus with diabetic neuropathy, unspecified: Secondary | ICD-10-CM | POA: Diagnosis not present

## 2018-12-09 DIAGNOSIS — S62102D Fracture of unspecified carpal bone, left wrist, subsequent encounter for fracture with routine healing: Secondary | ICD-10-CM | POA: Diagnosis not present

## 2018-12-09 DIAGNOSIS — M1990 Unspecified osteoarthritis, unspecified site: Secondary | ICD-10-CM | POA: Diagnosis not present

## 2018-12-09 DIAGNOSIS — I10 Essential (primary) hypertension: Secondary | ICD-10-CM | POA: Diagnosis not present

## 2018-12-09 DIAGNOSIS — F419 Anxiety disorder, unspecified: Secondary | ICD-10-CM | POA: Diagnosis not present

## 2018-12-09 DIAGNOSIS — K219 Gastro-esophageal reflux disease without esophagitis: Secondary | ICD-10-CM | POA: Diagnosis not present

## 2018-12-09 DIAGNOSIS — J309 Allergic rhinitis, unspecified: Secondary | ICD-10-CM | POA: Diagnosis not present

## 2018-12-11 ENCOUNTER — Encounter: Payer: Medicare HMO | Admitting: Gastroenterology

## 2018-12-11 DIAGNOSIS — E1140 Type 2 diabetes mellitus with diabetic neuropathy, unspecified: Secondary | ICD-10-CM | POA: Diagnosis not present

## 2018-12-11 DIAGNOSIS — F419 Anxiety disorder, unspecified: Secondary | ICD-10-CM | POA: Diagnosis not present

## 2018-12-11 DIAGNOSIS — J309 Allergic rhinitis, unspecified: Secondary | ICD-10-CM | POA: Diagnosis not present

## 2018-12-11 DIAGNOSIS — K219 Gastro-esophageal reflux disease without esophagitis: Secondary | ICD-10-CM | POA: Diagnosis not present

## 2018-12-11 DIAGNOSIS — I10 Essential (primary) hypertension: Secondary | ICD-10-CM | POA: Diagnosis not present

## 2018-12-11 DIAGNOSIS — R270 Ataxia, unspecified: Secondary | ICD-10-CM | POA: Diagnosis not present

## 2018-12-11 DIAGNOSIS — F329 Major depressive disorder, single episode, unspecified: Secondary | ICD-10-CM | POA: Diagnosis not present

## 2018-12-11 DIAGNOSIS — S62102D Fracture of unspecified carpal bone, left wrist, subsequent encounter for fracture with routine healing: Secondary | ICD-10-CM | POA: Diagnosis not present

## 2018-12-11 DIAGNOSIS — M1990 Unspecified osteoarthritis, unspecified site: Secondary | ICD-10-CM | POA: Diagnosis not present

## 2018-12-13 ENCOUNTER — Telehealth: Payer: Self-pay | Admitting: Gastroenterology

## 2018-12-13 DIAGNOSIS — E785 Hyperlipidemia, unspecified: Secondary | ICD-10-CM | POA: Diagnosis not present

## 2018-12-13 DIAGNOSIS — M4316 Spondylolisthesis, lumbar region: Secondary | ICD-10-CM | POA: Diagnosis not present

## 2018-12-13 DIAGNOSIS — E876 Hypokalemia: Secondary | ICD-10-CM | POA: Diagnosis not present

## 2018-12-13 NOTE — Telephone Encounter (Signed)

## 2018-12-14 ENCOUNTER — Other Ambulatory Visit: Payer: Self-pay

## 2018-12-14 ENCOUNTER — Encounter: Payer: Self-pay | Admitting: Gastroenterology

## 2018-12-14 ENCOUNTER — Ambulatory Visit (AMBULATORY_SURGERY_CENTER): Payer: Medicare HMO | Admitting: Gastroenterology

## 2018-12-14 VITALS — BP 142/72 | HR 69 | Temp 97.4°F | Resp 11 | Ht 65.5 in | Wt 168.0 lb

## 2018-12-14 DIAGNOSIS — K222 Esophageal obstruction: Secondary | ICD-10-CM

## 2018-12-14 DIAGNOSIS — R1032 Left lower quadrant pain: Secondary | ICD-10-CM | POA: Diagnosis not present

## 2018-12-14 DIAGNOSIS — K317 Polyp of stomach and duodenum: Secondary | ICD-10-CM | POA: Diagnosis not present

## 2018-12-14 DIAGNOSIS — K219 Gastro-esophageal reflux disease without esophagitis: Secondary | ICD-10-CM

## 2018-12-14 DIAGNOSIS — D124 Benign neoplasm of descending colon: Secondary | ICD-10-CM | POA: Diagnosis not present

## 2018-12-14 DIAGNOSIS — K5730 Diverticulosis of large intestine without perforation or abscess without bleeding: Secondary | ICD-10-CM | POA: Diagnosis not present

## 2018-12-14 DIAGNOSIS — K449 Diaphragmatic hernia without obstruction or gangrene: Secondary | ICD-10-CM | POA: Diagnosis not present

## 2018-12-14 DIAGNOSIS — D122 Benign neoplasm of ascending colon: Secondary | ICD-10-CM | POA: Diagnosis not present

## 2018-12-14 DIAGNOSIS — R1310 Dysphagia, unspecified: Secondary | ICD-10-CM | POA: Diagnosis not present

## 2018-12-14 DIAGNOSIS — K648 Other hemorrhoids: Secondary | ICD-10-CM | POA: Diagnosis not present

## 2018-12-14 DIAGNOSIS — D120 Benign neoplasm of cecum: Secondary | ICD-10-CM | POA: Diagnosis not present

## 2018-12-14 DIAGNOSIS — K5900 Constipation, unspecified: Secondary | ICD-10-CM | POA: Diagnosis not present

## 2018-12-14 MED ORDER — SODIUM CHLORIDE 0.9 % IV SOLN: 500.0000 mL | Freq: Once | INTRAVENOUS | Status: DC

## 2018-12-14 MED ORDER — HYDROCORTISONE (PERIANAL) 2.5 % EX CREA: 1.0000 "application " | TOPICAL_CREAM | Freq: Two times a day (BID) | CUTANEOUS | 3 refills | Status: AC

## 2018-12-14 NOTE — Progress Notes (Signed)
Called to room to assist during endoscopic procedure.  Patient ID and intended procedure confirmed with present staff. Received instructions for my participation in the procedure from the performing physician.  

## 2018-12-14 NOTE — Progress Notes (Signed)
[place]

## 2018-12-14 NOTE — Progress Notes (Signed)
PT taken to PACU. Monitors in place. VSS. Report given to RN. 

## 2018-12-14 NOTE — Op Note (Signed)
[facility] Patient Name: Deborah Hutchinson Procedure Date: 12/14/2018 3:54 PM MRN: [id number] Endoscopist: Jackquline Denmark , MD Age: 73 Referring MD:  Date of Birth: 1945-05-26 Gender: Female Account #: [id number] Procedure:                Upper GI endoscopy Indications:              Epigastric abdominal pain, GERD with intermittent                            dysphagia. Medicines:                Monitored Anesthesia Care Procedure:                Pre-Anesthesia Assessment:                           - Prior to the procedure, a History and Physical                            was performed, and patient medications and                            allergies were reviewed. The patient's tolerance of                            previous anesthesia was also reviewed. The risks                            and benefits of the procedure and the sedation                            options and risks were discussed with the patient.                            All questions were answered, and informed consent                            was obtained. Prior Anticoagulants: The patient has                            taken no previous anticoagulant or antiplatelet                            agents. ASA Grade Assessment: III - A patient with                            severe systemic disease. After reviewing the risks                            and benefits, the patient was deemed in                            satisfactory condition to undergo the procedure.                           -  Prior to the procedure, a History and Physical                            was performed, and patient medications and                            allergies were reviewed. The patient's tolerance of                            previous anesthesia was also reviewed. The risks                            and benefits of the procedure and the sedation                            options and risks were discussed with the patient.                         All questions were answered, and informed consent                            was obtained. Prior Anticoagulants: The patient has                            taken no previous anticoagulant or antiplatelet                            agents. ASA Grade Assessment: III - A patient with                            severe systemic disease. After reviewing the risks                            and benefits, the patient was deemed in                            satisfactory condition to undergo the procedure.                           After obtaining informed consent, the endoscope was                            passed under direct vision. Throughout the                            procedure, the patient's blood pressure, pulse, and                            oxygen saturations were monitored continuously. The                            Endoscope was introduced through the mouth, and  advanced to the second part of duodenum. The upper                            GI endoscopy was accomplished without difficulty.                            The patient tolerated the procedure well. Scope In: Scope Out: Findings:                 The esophagus was foreshortened ( d/t large hiatal                            hernia) with a wide open distal esophageal                            stricture at GE junction 30 cm from the incisors.                            No erosions or any ulcers. Dilation was performed                            with a Maloney dilator with mild resistance at 50                            Fr.                           An 8 cm hiatal hernia was present extending from 30                            cm up to 38 cm (GE junction up to diaphragmatic                            hiatus)                           2 semi-pedunculated polyps measuring 8 mm and 10 mm                            were visualized at the diaphragmatic hiatus with                             superficial ulcers. Multiple biopsies were obtained                            and sent for histology.                           4-5 more sessile and semi-pedunculated polyps,                            largest measuring 10 mm were found in the gastric  body. Biopsies were taken with a cold forceps for                            histology. Estimated blood loss: none.                           The examined duodenum was normal. Complications:            No immediate complications. Estimated Blood Loss:     Estimated blood loss: none. Impression:               -Wide open distal esophageal stricture s/p                            esophageal dilatation.                           -Large hiatal hernia.                           -Multiple gastric polyps. Biopsied. Recommendation:           - Patient has a contact number available for                            emergencies. The signs and symptoms of potential                            delayed complications were discussed with the                            patient. Return to normal activities tomorrow.                            Written discharge instructions were provided to the                            patient.                           - Post dilatation diet.                           - Continue omeprazole 20 mg p.o. twice daily for                            now.                           - Await pathology results.                           - No aspirin, ibuprofen, naproxen, or other                            non-steroidal anti-inflammatory drugs for 5 days  after biopsy.                           - If still with problems, will perform further                            work-up. If she is found to be anemic or depending                            upon the biopsies, would proceed with repeat EGD                            with polypectomy at [facility].                           -  Follow-up in the GI clinic in 6 to 8 weeks. Jackquline Denmark, MD 12/14/2018 4:47:42 PM This report has been signed electronically.

## 2018-12-14 NOTE — Op Note (Addendum)
[facility] Patient Name: Deborah Hutchinson Procedure Date: 12/14/2018 3:53 PM MRN: [id number] Endoscopist: Jackquline Denmark , MD Age: 73 Referring MD:  Date of Birth: 1946/01/27 Gender: Female Account #: [id number] Procedure:                Colonoscopy Indications:              Abdominal pain in the left lower quadrant, history                            of colonic polyps., Chronic constipation. Medicines:                Monitored Anesthesia Care Procedure:                Pre-Anesthesia Assessment:                           - Prior to the procedure, a History and Physical                            was performed, and patient medications and                            allergies were reviewed. The patient's tolerance of                            previous anesthesia was also reviewed. The risks                            and benefits of the procedure and the sedation                            options and risks were discussed with the patient.                            All questions were answered, and informed consent                            was obtained. Prior Anticoagulants: The patient has                            taken no previous anticoagulant or antiplatelet                            agents. ASA Grade Assessment: III - A patient with                            severe systemic disease. After reviewing the risks                            and benefits, the patient was deemed in                            satisfactory condition to undergo the procedure.  After obtaining informed consent, the colonoscope                            was passed under direct vision. Throughout the                            procedure, the patient's blood pressure, pulse, and                            oxygen saturations were monitored continuously. The                            Colonoscope was introduced through the anus and                            advanced to the 1 cm  into the ileum. The                            colonoscopy was somewhat difficult due to a                            redundant colon. The patient tolerated the                            procedure well. The quality of the bowel                            preparation was good. The ileocecal valve,                            appendiceal orifice, and rectum were photographed.                            The colon was highly redundant. Scope In: 4:07:10 PM Scope Out: 4:25:14 PM Scope Withdrawal Time: 0 hours 12 minutes 51 seconds  Total Procedure Duration: 0 hours 18 minutes 4 seconds  Findings:                 A 4 mm polyp was found in the cecum. The polyp was                            sessile. The polyp was removed with a cold biopsy                            forceps. Resection and retrieval were complete.                           A 6 mm polyp was found in the mid ascending colon.                            The polyp was sessile. The polyp was removed with a  cold snare. Resection and retrieval were complete.                           A 10 mm polyp was found in the proximal descending                            colon. The polyp was semi-sessile. The polyp was                            removed with a hot snare. Resection and retrieval                            were complete.                           A 6 mm polyp was found in the mid descending colon.                            The polyp was sessile. The polyp was removed with a                            cold snare. Resection and retrieval were complete.                           A few small-mouthed diverticula were found in the                            sigmoid colon, descending colon and ascending colon.                           Non-bleeding internal hemorrhoids were found during                            retroflexion. The hemorrhoids were moderate.                           The exam was otherwise without  abnormality on                            direct and retroflexion views. Complications:            No immediate complications. Estimated Blood Loss:     Estimated blood loss: none. Impression:               -Colonic polyps status post polypectomy.                           -Mild pancolonic diverticulosis.                           -Non-bleeding internal hemorrhoids.                           -Otherwise normal colonoscopy to TI. The colon was  highly redundant. Recommendation:           - Patient has a contact number available for                            emergencies. The signs and symptoms of potential                            delayed complications were discussed with the                            patient. Return to normal activities tomorrow.                            Written discharge instructions were provided to the                            patient.                           - Resume previous diet.                           - Continue present medications.                           - Await pathology results.                           - Repeat colonoscopy for surveillance based on                            pathology results.                           - Use Benefiber one teaspoon PO daily.                           - Avoid nonsteroidals for 5 days.                           - HC 2.5% PR BID x 10 days, 3 refiils.                           - FU in GI clinic in 8 to 12 weeks. Jackquline Denmark, MD 12/14/2018 4:36:11 PM This report has been signed electronically.

## 2018-12-14 NOTE — Patient Instructions (Addendum)
Handouts given for polyps, diverticulosis, hemorrhoids.  Also given for Hiatal Hernia, Esophageal stricture and Post-Dilation diet.  Continue using Omeprazole 20 mg twice a day for now.  Use Benefiber one teaspoon  Daily.  Avoid NSAIDS (Aspirin, ibuprofen, Aleve, naproxen) for 5 days.  You may use Tylenol if needed.  YOU HAD AN ENDOSCOPIC PROCEDURE TODAY AT [facility]:   Refer to the procedure report that was given to you for any specific questions about what was found during the examination.  If the procedure report does not answer your questions, please call your gastroenterologist to clarify.  If you requested that your care partner not be given the details of your procedure findings, then the procedure report has been included in a sealed envelope for you to review at your convenience later.  YOU SHOULD EXPECT: Some feelings of bloating in the abdomen. Passage of more gas than usual.  Walking can help get rid of the air that was put into your GI tract during the procedure and reduce the bloating. If you had a lower endoscopy (such as a colonoscopy or flexible sigmoidoscopy) you may notice spotting of blood in your stool or on the toilet paper. If you underwent a bowel prep for your procedure, you may not have a normal bowel movement for a few days.  Please Note:  You might notice some irritation and congestion in your nose or some drainage.  This is from the oxygen used during your procedure.  There is no need for concern and it should clear up in a day or so.  SYMPTOMS TO REPORT IMMEDIATELY:   Following lower endoscopy (colonoscopy or flexible sigmoidoscopy):  Excessive amounts of blood in the stool  Significant tenderness or worsening of abdominal pains  Swelling of the abdomen that is new, acute  Fever of 100F or higher   Following upper endoscopy (EGD)  Vomiting of blood or coffee ground material  New chest pain or pain under the shoulder blades  Painful or  persistently difficult swallowing  New shortness of breath  Fever of 100F or higher  Black, tarry-looking stools  For urgent or emergent issues, a gastroenterologist can be reached at any hour by calling [phone].   DIET:  We do recommend a small meal at first, but then you may proceed to your regular diet.  Drink plenty of fluids but you should avoid alcoholic beverages for 24 hours.  ACTIVITY:  You should plan to take it easy for the rest of today and you should NOT DRIVE or use heavy machinery until tomorrow (because of the sedation medicines used during the test).    FOLLOW UP: Our staff will call the number listed on your records 48-72 hours following your procedure to check on you and address any questions or concerns that you may have regarding the information given to you following your procedure. If we do not reach you, we will leave a message.  We will attempt to reach you two times.  During this call, we will ask if you have developed any symptoms of COVID 19. If you develop any symptoms (ie: fever, flu-like symptoms, shortness of breath, cough etc.) before then, please call [phone].  If you test positive for Covid 19 in the 2 weeks post procedure, please call and report this information to [place].    If any biopsies were taken you will be contacted by phone or by letter within the next 1-3 weeks.  Please call us at [phone] if  you have not heard about the biopsies in 3 weeks.    SIGNATURES/CONFIDENTIALITY: You and/or your care partner have signed paperwork which will be entered into your electronic medical record.  These signatures attest to the fact that that the information above on your After Visit Summary has been reviewed and is understood.  Full responsibility of the confidentiality of this discharge information lies with you and/or your care-partner.

## 2018-12-17 DIAGNOSIS — S62337D Displaced fracture of neck of fifth metacarpal bone, left hand, subsequent encounter for fracture with routine healing: Secondary | ICD-10-CM | POA: Diagnosis not present

## 2018-12-17 DIAGNOSIS — S62339D Displaced fracture of neck of unspecified metacarpal bone, subsequent encounter for fracture with routine healing: Secondary | ICD-10-CM | POA: Diagnosis not present

## 2018-12-18 ENCOUNTER — Telehealth: Payer: Self-pay

## 2018-12-18 DIAGNOSIS — E1140 Type 2 diabetes mellitus with diabetic neuropathy, unspecified: Secondary | ICD-10-CM | POA: Diagnosis not present

## 2018-12-18 DIAGNOSIS — F329 Major depressive disorder, single episode, unspecified: Secondary | ICD-10-CM | POA: Diagnosis not present

## 2018-12-18 DIAGNOSIS — R270 Ataxia, unspecified: Secondary | ICD-10-CM | POA: Diagnosis not present

## 2018-12-18 DIAGNOSIS — S62102D Fracture of unspecified carpal bone, left wrist, subsequent encounter for fracture with routine healing: Secondary | ICD-10-CM | POA: Diagnosis not present

## 2018-12-18 DIAGNOSIS — K219 Gastro-esophageal reflux disease without esophagitis: Secondary | ICD-10-CM | POA: Diagnosis not present

## 2018-12-18 DIAGNOSIS — M1990 Unspecified osteoarthritis, unspecified site: Secondary | ICD-10-CM | POA: Diagnosis not present

## 2018-12-18 DIAGNOSIS — J309 Allergic rhinitis, unspecified: Secondary | ICD-10-CM | POA: Diagnosis not present

## 2018-12-18 DIAGNOSIS — F419 Anxiety disorder, unspecified: Secondary | ICD-10-CM | POA: Diagnosis not present

## 2018-12-18 DIAGNOSIS — I10 Essential (primary) hypertension: Secondary | ICD-10-CM | POA: Diagnosis not present

## 2018-12-18 NOTE — Telephone Encounter (Signed)
Mailbox was full so unable to leave message on follow up call.

## 2018-12-21 ENCOUNTER — Telehealth: Payer: Self-pay | Admitting: Gastroenterology

## 2018-12-21 DIAGNOSIS — Z7982 Long term (current) use of aspirin: Secondary | ICD-10-CM | POA: Diagnosis not present

## 2018-12-21 DIAGNOSIS — Z79899 Other long term (current) drug therapy: Secondary | ICD-10-CM | POA: Diagnosis not present

## 2018-12-21 DIAGNOSIS — R1310 Dysphagia, unspecified: Secondary | ICD-10-CM

## 2018-12-21 DIAGNOSIS — M48061 Spinal stenosis, lumbar region without neurogenic claudication: Secondary | ICD-10-CM | POA: Diagnosis not present

## 2018-12-21 DIAGNOSIS — M25552 Pain in left hip: Secondary | ICD-10-CM | POA: Diagnosis not present

## 2018-12-21 DIAGNOSIS — R112 Nausea with vomiting, unspecified: Secondary | ICD-10-CM

## 2018-12-21 DIAGNOSIS — M25551 Pain in right hip: Secondary | ICD-10-CM | POA: Diagnosis not present

## 2018-12-21 DIAGNOSIS — M4316 Spondylolisthesis, lumbar region: Secondary | ICD-10-CM | POA: Diagnosis not present

## 2018-12-21 NOTE — Telephone Encounter (Signed)
Attempted to reach patient-unable to speak to patient-unable to leave a VM as mailbox is not available; will attempt to reach patient at a later time;

## 2018-12-22 ENCOUNTER — Encounter: Payer: Self-pay | Admitting: Gastroenterology

## 2018-12-24 ENCOUNTER — Encounter: Payer: Medicare HMO | Admitting: Gastroenterology

## 2018-12-24 DIAGNOSIS — J309 Allergic rhinitis, unspecified: Secondary | ICD-10-CM | POA: Diagnosis not present

## 2018-12-24 DIAGNOSIS — E1140 Type 2 diabetes mellitus with diabetic neuropathy, unspecified: Secondary | ICD-10-CM | POA: Diagnosis not present

## 2018-12-24 DIAGNOSIS — R270 Ataxia, unspecified: Secondary | ICD-10-CM | POA: Diagnosis not present

## 2018-12-24 DIAGNOSIS — S62102D Fracture of unspecified carpal bone, left wrist, subsequent encounter for fracture with routine healing: Secondary | ICD-10-CM | POA: Diagnosis not present

## 2018-12-24 DIAGNOSIS — I10 Essential (primary) hypertension: Secondary | ICD-10-CM | POA: Diagnosis not present

## 2018-12-24 DIAGNOSIS — K219 Gastro-esophageal reflux disease without esophagitis: Secondary | ICD-10-CM | POA: Diagnosis not present

## 2018-12-24 DIAGNOSIS — F419 Anxiety disorder, unspecified: Secondary | ICD-10-CM | POA: Diagnosis not present

## 2018-12-24 DIAGNOSIS — M1990 Unspecified osteoarthritis, unspecified site: Secondary | ICD-10-CM | POA: Diagnosis not present

## 2018-12-24 DIAGNOSIS — F329 Major depressive disorder, single episode, unspecified: Secondary | ICD-10-CM | POA: Diagnosis not present

## 2018-12-24 NOTE — Telephone Encounter (Signed)
Attempted to reach patient-unable to leave a message- In the meantime-do you have any suggestions for alternative fiber sources

## 2018-12-24 NOTE — Telephone Encounter (Signed)
Called and spoke with [name] reports she is still having the vomiting episodes at night when she is asleep-feeling like "food and pills are sticking at the top of my stomach" Please advise if there is anything she "can do to make it not feel like that"  -patient was informed that [place] is what Dr. Lyndel Safe is wanting her to have; patient verbalized understanding of information/instructions;

## 2018-12-24 NOTE — Telephone Encounter (Signed)
No same Benefiber 1TBS po qd every day with 8 ounces of water  RG

## 2018-12-25 NOTE — Telephone Encounter (Signed)
Lets get  UGI series (also please ask radiology to give barium tablet) RE: dysphagia, nausea/vomiting  Must continue her medications  RG

## 2018-12-26 NOTE — Telephone Encounter (Signed)
Called and spoke with patient-patient informed of MD recommendations; patient is agreeable with plan of care and has requested these orders be done at [facility] - orders for lab and UGI series with barium tablet faxed to [facility] is scheduled to have lab work at 8:00 am on 12/31/2018 at [facility] series also scheduled on 12/31/2018 at 9:00 am;  Patient made aware of appt date/time and also to be NPO after midnight; Patient verbalized understanding of information/instructions;  Patient was advised to call the office at [phone] if questions/concerns arise;

## 2018-12-27 ENCOUNTER — Telehealth: Payer: Self-pay | Admitting: Gastroenterology

## 2018-12-27 ENCOUNTER — Ambulatory Visit: Payer: Medicare HMO | Admitting: Cardiology

## 2018-12-27 ENCOUNTER — Other Ambulatory Visit: Payer: Self-pay | Admitting: Cardiology

## 2018-12-27 NOTE — Telephone Encounter (Signed)
Called and spoke with patient-patient advised that the UGI series is not like a CT or MRI; and that she is scheduled to have the scan at [facility]; patient was "relieved that I don't have to get in that big metal cylinder"; patient given the number to [facility] in case she needs to reschedule her appt for her CT scan -appt on 12/31/2018-see previous message concerning this patient;  Patient advised to call back to the office should questions/concerns arise; patient verbalized understanding of information/instructions;

## 2018-12-27 NOTE — Telephone Encounter (Signed)
Pt reported that she is to have "something done on Monday and that she is claustrophobic so she cannot get into any machine."

## 2018-12-28 DIAGNOSIS — H5203 Hypermetropia, bilateral: Secondary | ICD-10-CM | POA: Diagnosis not present

## 2018-12-29 DIAGNOSIS — H52209 Unspecified astigmatism, unspecified eye: Secondary | ICD-10-CM | POA: Diagnosis not present

## 2018-12-29 DIAGNOSIS — H524 Presbyopia: Secondary | ICD-10-CM | POA: Diagnosis not present

## 2018-12-29 DIAGNOSIS — H5203 Hypermetropia, bilateral: Secondary | ICD-10-CM | POA: Diagnosis not present

## 2018-12-31 ENCOUNTER — Encounter: Payer: Self-pay | Admitting: Gastroenterology

## 2018-12-31 DIAGNOSIS — K219 Gastro-esophageal reflux disease without esophagitis: Secondary | ICD-10-CM | POA: Diagnosis not present

## 2018-12-31 DIAGNOSIS — R112 Nausea with vomiting, unspecified: Secondary | ICD-10-CM | POA: Diagnosis not present

## 2018-12-31 DIAGNOSIS — R1310 Dysphagia, unspecified: Secondary | ICD-10-CM | POA: Diagnosis not present

## 2018-12-31 DIAGNOSIS — K449 Diaphragmatic hernia without obstruction or gangrene: Secondary | ICD-10-CM | POA: Diagnosis not present

## 2019-01-01 DIAGNOSIS — I10 Essential (primary) hypertension: Secondary | ICD-10-CM | POA: Diagnosis not present

## 2019-01-01 DIAGNOSIS — J309 Allergic rhinitis, unspecified: Secondary | ICD-10-CM | POA: Diagnosis not present

## 2019-01-01 DIAGNOSIS — E1140 Type 2 diabetes mellitus with diabetic neuropathy, unspecified: Secondary | ICD-10-CM | POA: Diagnosis not present

## 2019-01-01 DIAGNOSIS — F419 Anxiety disorder, unspecified: Secondary | ICD-10-CM | POA: Diagnosis not present

## 2019-01-01 DIAGNOSIS — S62102D Fracture of unspecified carpal bone, left wrist, subsequent encounter for fracture with routine healing: Secondary | ICD-10-CM | POA: Diagnosis not present

## 2019-01-01 DIAGNOSIS — F329 Major depressive disorder, single episode, unspecified: Secondary | ICD-10-CM | POA: Diagnosis not present

## 2019-01-01 DIAGNOSIS — R270 Ataxia, unspecified: Secondary | ICD-10-CM | POA: Diagnosis not present

## 2019-01-01 DIAGNOSIS — K219 Gastro-esophageal reflux disease without esophagitis: Secondary | ICD-10-CM | POA: Diagnosis not present

## 2019-01-01 DIAGNOSIS — M1990 Unspecified osteoarthritis, unspecified site: Secondary | ICD-10-CM | POA: Diagnosis not present

## 2019-01-08 DIAGNOSIS — M1990 Unspecified osteoarthritis, unspecified site: Secondary | ICD-10-CM | POA: Diagnosis not present

## 2019-01-09 DIAGNOSIS — Z780 Asymptomatic menopausal state: Secondary | ICD-10-CM | POA: Diagnosis not present

## 2019-01-10 DIAGNOSIS — F419 Anxiety disorder, unspecified: Secondary | ICD-10-CM | POA: Diagnosis not present

## 2019-01-10 DIAGNOSIS — M1990 Unspecified osteoarthritis, unspecified site: Secondary | ICD-10-CM | POA: Diagnosis not present

## 2019-01-10 DIAGNOSIS — F329 Major depressive disorder, single episode, unspecified: Secondary | ICD-10-CM | POA: Diagnosis not present

## 2019-01-10 DIAGNOSIS — K219 Gastro-esophageal reflux disease without esophagitis: Secondary | ICD-10-CM | POA: Diagnosis not present

## 2019-01-10 DIAGNOSIS — I10 Essential (primary) hypertension: Secondary | ICD-10-CM | POA: Diagnosis not present

## 2019-01-10 DIAGNOSIS — R270 Ataxia, unspecified: Secondary | ICD-10-CM | POA: Diagnosis not present

## 2019-01-10 DIAGNOSIS — S62102D Fracture of unspecified carpal bone, left wrist, subsequent encounter for fracture with routine healing: Secondary | ICD-10-CM | POA: Diagnosis not present

## 2019-01-10 DIAGNOSIS — E1140 Type 2 diabetes mellitus with diabetic neuropathy, unspecified: Secondary | ICD-10-CM | POA: Diagnosis not present

## 2019-01-10 DIAGNOSIS — J309 Allergic rhinitis, unspecified: Secondary | ICD-10-CM | POA: Diagnosis not present

## 2019-01-21 ENCOUNTER — Telehealth: Payer: Medicare HMO | Admitting: Cardiology

## 2019-01-21 DIAGNOSIS — E669 Obesity, unspecified: Secondary | ICD-10-CM | POA: Diagnosis not present

## 2019-01-21 DIAGNOSIS — S62339D Displaced fracture of neck of unspecified metacarpal bone, subsequent encounter for fracture with routine healing: Secondary | ICD-10-CM | POA: Diagnosis not present

## 2019-01-21 DIAGNOSIS — L239 Allergic contact dermatitis, unspecified cause: Secondary | ICD-10-CM | POA: Diagnosis not present

## 2019-01-21 DIAGNOSIS — Z6830 Body mass index (BMI) 30.0-30.9, adult: Secondary | ICD-10-CM | POA: Diagnosis not present

## 2019-01-22 DIAGNOSIS — M5116 Intervertebral disc disorders with radiculopathy, lumbar region: Secondary | ICD-10-CM | POA: Diagnosis not present

## 2019-01-22 DIAGNOSIS — M4316 Spondylolisthesis, lumbar region: Secondary | ICD-10-CM | POA: Diagnosis not present

## 2019-01-22 DIAGNOSIS — M47817 Spondylosis without myelopathy or radiculopathy, lumbosacral region: Secondary | ICD-10-CM | POA: Diagnosis not present

## 2019-01-25 ENCOUNTER — Telehealth: Payer: Self-pay | Admitting: Gastroenterology

## 2019-01-25 NOTE — Telephone Encounter (Signed)
Called and spoke with patient-patient informed of MD recommendations; patient is agreeable with plan of care and patient reports she is still having hard stools; patient was advised to begin Miralax 2-3 times per day until having normal bowel movement in addition to the [place] then to decrease the Miralax to once daily as needed; patient reports she will call the office an update the office if she still needs instructions; Patient verbalized understanding of information/instructions;  Patient was advised to call the office at [phone] if questions/concerns arise;

## 2019-01-25 NOTE — Telephone Encounter (Signed)
Please advise of alternative medication -please review previous message

## 2019-01-25 NOTE — Telephone Encounter (Signed)
She can hold off on Bentyl for now She has more constipation Continue taking Linzess 72 every other day as per last note.  Continue Benefiber as previous note. She may not need Bentyl  RG

## 2019-01-29 ENCOUNTER — Telehealth: Payer: Self-pay | Admitting: Gastroenterology

## 2019-01-29 NOTE — Telephone Encounter (Signed)
Called and spoke with patient- patient is asking if Dr. Lyndel Safe can place on order for the patient to have a bed that raises/lowers by button (mechanical bed-hospital bed) to assist in movement/  Patient was informed that she may need to go through PCP for that request but a message would be sent to Dr. Lyndel Safe for recommendations Please advise

## 2019-01-30 NOTE — Telephone Encounter (Signed)
Called and spoke with Dr.Uppin's nurse (Angela)-informed of patient request;

## 2019-01-30 NOTE — Telephone Encounter (Signed)
I am sure Dr. Rica Records will not have any problems ordering a mechanical hospital bed. If there are any problems, please let me know. I will do anything to help   Thx  RG

## 2019-02-02 DIAGNOSIS — Z1231 Encounter for screening mammogram for malignant neoplasm of breast: Secondary | ICD-10-CM | POA: Diagnosis not present

## 2019-02-08 DIAGNOSIS — D473 Essential (hemorrhagic) thrombocythemia: Secondary | ICD-10-CM | POA: Diagnosis not present

## 2019-02-13 ENCOUNTER — Telehealth: Payer: Self-pay | Admitting: Neurology

## 2019-02-13 ENCOUNTER — Telehealth: Payer: Self-pay | Admitting: *Deleted

## 2019-02-13 ENCOUNTER — Other Ambulatory Visit: Payer: Self-pay

## 2019-02-13 ENCOUNTER — Encounter: Payer: Self-pay | Admitting: Cardiology

## 2019-02-13 ENCOUNTER — Ambulatory Visit (INDEPENDENT_AMBULATORY_CARE_PROVIDER_SITE_OTHER): Payer: Medicare HMO | Admitting: Cardiology

## 2019-02-13 VITALS — BP 138/62 | HR 64 | Ht 65.5 in | Wt 191.0 lb

## 2019-02-13 DIAGNOSIS — E782 Mixed hyperlipidemia: Secondary | ICD-10-CM | POA: Diagnosis not present

## 2019-02-13 DIAGNOSIS — H9313 Tinnitus, bilateral: Secondary | ICD-10-CM

## 2019-02-13 DIAGNOSIS — I10 Essential (primary) hypertension: Secondary | ICD-10-CM | POA: Diagnosis not present

## 2019-02-13 DIAGNOSIS — G4733 Obstructive sleep apnea (adult) (pediatric): Secondary | ICD-10-CM

## 2019-02-13 DIAGNOSIS — I350 Nonrheumatic aortic (valve) stenosis: Secondary | ICD-10-CM | POA: Diagnosis not present

## 2019-02-13 DIAGNOSIS — I2510 Atherosclerotic heart disease of native coronary artery without angina pectoris: Secondary | ICD-10-CM

## 2019-02-13 DIAGNOSIS — E088 Diabetes mellitus due to underlying condition with unspecified complications: Secondary | ICD-10-CM | POA: Diagnosis not present

## 2019-02-13 DIAGNOSIS — J431 Panlobular emphysema: Secondary | ICD-10-CM | POA: Diagnosis not present

## 2019-02-13 DIAGNOSIS — R519 Headache, unspecified: Secondary | ICD-10-CM

## 2019-02-13 HISTORY — DX: Atherosclerotic heart disease of native coronary artery without angina pectoris: I25.10

## 2019-02-13 MED ORDER — CLOPIDOGREL BISULFATE 75 MG PO TABS: 75.0000 mg | ORAL_TABLET | Freq: Every day | ORAL | 11 refills | Status: DC

## 2019-02-13 NOTE — Telephone Encounter (Signed)
I spoke with Ms. Deborah Hutchinson cardiologist, Dr. Geraldo Pitter.  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  I asked him if it would be okay to switch from ASA 81mg  daily to Plavix 75mg  daily.  He is okay with that change.  I have attempted to contact Ms. Guerry but I am unable to leave a message on either her house phone or mobile phone.  I would like her to be contacted to let her know that I will start her on Plavix 75mg  daily as a blood thinner.  She will stop the aspirin.  We are making this change due to plaque buildup the blood vessels in her head.  I would like her to make a follow up appointment to discuss further.

## 2019-02-13 NOTE — Telephone Encounter (Signed)
I spoke with Ms. Deborah Hutchinson cardiologist, Dr. Geraldo Pitter.  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  I asked him if it would be okay to switch from ASA 81mg  daily to Plavix 75mg  daily.  He is okay with that change.  I have left a message for Deborah Hutchinson to contact the office so I may explain this change in management.  I will then have her make a follow up appointment.

## 2019-02-13 NOTE — Telephone Encounter (Addendum)
Denisha with cardiology called and this is a mutual patient that Dr. Tomi Likens and Dr. Sunny Schlein Revenkar have.  Patient was seen at Dr. Ervin Knack office today (cardiology) and he would like to speak to Dr. Tomi Likens about a CT done. He requested Dr. Tomi Likens call him and discuss today. Will inform Dr. Tomi Likens now. (MD made aware at 1122)

## 2019-02-13 NOTE — Addendum Note (Signed)
Addended by: Jesse Fall on: 02/13/2019 01:43 PM   Modules accepted: Orders

## 2019-02-13 NOTE — Telephone Encounter (Addendum)
Spoke to patient and relayed all of Dr. Georgie Chard information in the message below to patient. She will STOP the Aspirin and START Plavix 75mg  daily. She said to send it to [facility] and she will pick up tomorrow (order entered). Patient repeated this back to me and verbalizes understanding.  Informed her MD wants her to have a follow up appt. Front desk will call and schedule this today.

## 2019-02-13 NOTE — Patient Instructions (Signed)

## 2019-02-13 NOTE — Progress Notes (Signed)
Cardiology Office Note:    Date:  02/13/2019   ID:  Deborah Hutchinson, DOB 11-Dec-1945, MRN [id number]  PCP:  Deborah Johns, MD  Cardiologist:  Deborah Lindau, MD   Referring MD: Deborah Johns, MD    ASSESSMENT:    1. Coronary artery disease involving native coronary artery of native heart without angina pectoris   2. OSA (obstructive sleep apnea)   3. Panlobular emphysema ([place])   4. Mild aortic stenosis   5. Essential hypertension   6. Diabetes mellitus due to underlying condition with unspecified complications ([place])   7. Mixed dyslipidemia    PLAN:    In order of problems listed above:  1. Coronary artery disease: Secondary prevention stressed with the patient.  Importance of compliance with diet and medication stressed and she vocalized understanding.  Her blood pressure is stable. Essential hypertension: Blood pressure is stable dietary issues were discussed Mixed dyslipidemia and diabetes mellitus: Diet was discussed.  Lipids are followed by primary care physician.  The last blood work available to me was fine.  This was from July. She had a CT scan done of the neck by a physician in [facility].  She does not know what his number the results.  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  Patient does not remember the treatment plan or management of this and she will call her doctor who ordered the test.  I will also get in touch with his physician who ordered this test to understand what the plan for this findings is with the medical or otherwise Patient will be seen in follow-up appointment in 6 months or earlier if the patient has any concerns  At 11:28 AM on 02/13/2019 I discussed her case and CT scan of the neck with Dr. Metta Clines neurologist extensively.  He will call the patient to switch the patient's medications from aspirin to Plavix.    Medication Adjustments/Labs and Tests Ordered: Current medicines are reviewed at length  with the patient today.  Concerns regarding medicines are outlined above.  No orders of the defined types were placed in this encounter.  No orders of the defined types were placed in this encounter.    Chief Complaint  Patient presents with  . Follow-up     History of Present Illness:    Deborah Hutchinson is a 73 y.o. female.  Patient history of coronary artery disease.  Patient also has essential hypertension and diabetes mellitus.  She denies any problems at this time and takes care of activities of daily living.  No chest pain orthopnea or PND.  At the time of my evaluation, the patient is alert awake oriented and in no distress.  Past Medical History:  Diagnosis Date  . Angina    normal stress test  . Anxiety   . Arthritis   . Asthma   . Bronchitis   . 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 Diabetes mellitus   . Diabetes mellitus due to underlying condition with unspecified complications ([place]) 0000000  . Dyslipidemia 06/10/2015  . Essential hypertension 06/10/2015  . GERD (gastroesophageal reflux disease)   . H/O hiatal hernia   . Heart murmur   . Hyperlipemia   . Hypertension   . Nonspecific (abnormal) findings on radiological and other examination of gastrointestinal tract 05/19/2011  . Pain and swelling of toe of right foot 07/15/2016  . Palpitations 06/02/2016  .  Shortness of breath    on  excertion  . Sleep apnea     Past Surgical History:  Procedure Laterality Date  . ABDOMINAL HYSTERECTOMY    . ABDOMINAL HYSTERECTOMY    . BREAST SURGERY     begin mass,left  breast  . COLONOSCOPY  08/17/2015   Colonic polyp status post polypectomy. Mild pancolonic diverticulosis. Highly redundant colon.   Marland Kitchen DILATION AND CURETTAGE OF UTERUS     one  . ESOPHAGOGASTRODUODENOSCOPY  08/17/2015   Moderate hiatal hernia. Otherwise noraml EGD.   . EUS  05/19/2011   Procedure: UPPER ENDOSCOPIC ULTRASOUND (EUS) LINEAR;   Surgeon: Owens Loffler, MD;  Location: [facility];  Service: Endoscopy;  Laterality: N/A;  radial linear   . HYSTEROTOMY    . KNEE SURGERY    . LEFT HEART CATH AND CORONARY ANGIOGRAPHY    . right thumb     tendon repaire  . SHOULDER SURGERY     rotator cuff repair  . WRIST SURGERY     rigth    Current Medications: Current Meds  Medication Sig  . albuterol (PROVENTIL HFA;VENTOLIN HFA) 108 (90 Base) MCG/ACT inhaler Inhale 2 puffs into the lungs every 6 (six) hours as needed for wheezing or shortness of breath.  Marland Kitchen amLODipine (NORVASC) 10 MG tablet Take 10 mg by mouth daily.  Marland Kitchen aspirin 81 MG tablet Take 81 mg by mouth daily.   . BD PEN NEEDLE NANO U/F 32G X 4 MM MISC   . budesonide-formoterol (SYMBICORT) 160-4.5 MCG/ACT inhaler Inhale 2 puffs into the lungs 2 (two) times daily.  Marland Kitchen buPROPion (WELLBUTRIN XL) 300 MG 24 hr tablet Take 300 mg by mouth daily.   . Cholecalciferol (VITAMIN D PO) Take 5,000 Units by mouth 2 (two) times daily.   Marland Kitchen dicyclomine (BENTYL) 20 MG tablet Take 20 mg by mouth every 6 (six) hours.  Marland Kitchen escitalopram (LEXAPRO) 10 MG tablet Take 10 mg by mouth daily.  . famotidine (PEPCID) 40 MG tablet Take 1 tablet (40 mg total) by mouth 2 (two) times daily.  . fluticasone (VERAMYST) 27.5 MCG/SPRAY nasal spray Place 2 sprays into the nose daily.  Marland Kitchen gabapentin (NEURONTIN) 300 MG capsule Take 300 mg by mouth 3 (three) times daily.  . hydrALAZINE (APRESOLINE) 25 MG tablet Take 37.5 mg by mouth 3 (three) times daily.  . isosorbide mononitrate (IMDUR) 60 MG 24 hr tablet Take 120 mg by mouth daily.  . Lancets (ACCU-CHEK MULTICLIX) lancets   . linaclotide (LINZESS) 72 MCG capsule Take 72 mcg by mouth as needed.  Marland Kitchen losartan-hydrochlorothiazide (HYZAAR) 100-25 MG tablet Take 1 tablet by mouth daily.  . metFORMIN (GLUCOPHAGE) 500 MG tablet Take 500 mg by mouth 2 (two) times daily with a meal.   . montelukast (SINGULAIR) 10 MG tablet Take 10 mg by mouth daily.  . Multiple Vitamin  (MULTIVITAMIN) capsule Take 1 capsule by mouth daily.    . nitroGLYCERIN (NITROSTAT) 0.4 MG SL tablet DISSOLVE ONE TABLET UNDER THE TONGUE EVERY 5 MINUTES AS NEEDED FOR CHEST PAIN.  DO NOT EXCEED A TOTAL OF 3 DOSES IN 15 MINUTES  . omeprazole (PRILOSEC) 20 MG capsule Take 20 mg by mouth 2 (two) times daily before a meal.   . simvastatin (ZOCOR) 10 MG tablet Take 10 mg by mouth daily.  . traMADol (ULTRAM) 50 MG tablet Take 50 mg by mouth 2 (two) times daily as needed for pain.  Marland Kitchen VICTOZA 18 MG/3ML SOPN 12 mg daily.      Allergies:  Penicillins   Social History   Socioeconomic History  . Marital status: Single    Spouse name: Not on file  . Number of children: Not on file  . Years of education: Not on file  . Highest education level: Not on file  Occupational History  . Not on file  Social Needs  . Financial resource strain: Not on file  . Food insecurity    Worry: Not on file    Inability: Not on file  . Transportation needs    Medical: Not on file    Non-medical: Not on file  Tobacco Use  . Smoking status: Former Smoker    Packs/day: 1.00    Quit date: 05/17/2011    Years since quitting: 7.7  . Smokeless tobacco: Never Used  Substance and Sexual Activity  . Alcohol use: No  . Drug use: No  . Sexual activity: Never  Lifestyle  . Physical activity    Days per week: Not on file    Minutes per session: Not on file  . Stress: Not on file  Relationships  . Social Herbalist on phone: Not on file    Gets together: Not on file    Attends religious service: Not on file    Active member of club or organization: Not on file    Attends meetings of clubs or organizations: Not on file    Relationship status: Not on file  Other Topics Concern  . Not on file  Social History Narrative  . Not on file     Family History: The patient's family history includes Diabetes in her sister; Hypertension in her daughter. There is no history of Colon cancer.  ROS:   Please see  the history of present illness.    All other systems reviewed and are negative.  EKGs/Labs/Other Studies Reviewed:    The following studies were reviewed today: IMPRESSION: 1. No evidence of acute intracranial abnormality. Mild chronic small vessel ischemic disease. 2. Intracranial atherosclerosis without large vessel occlusion or aneurysm. 3. 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 4. Cervical carotid atherosclerosis with 40% proximal left ICA stenosis and no significant stenosis on the right. 5. Widely patent vertebral arteries. 6.  Aortic Atherosclerosis (ICD10-I70.0).   Electronically Signed   By: Logan Bores M.D.   On: 11/12/2018 11:40  IMPRESSION: 1. Coronary calcium score of 593. This was 29 percentile for age and sex matched control.  2. Normal coronary origin with right dominance.  3. Mild to moderate non-obstructive CAD in the LAD and RCA. Additional analysis with CT FFR will be submitted.  4.  Mitral annular calcifications.   Electronically Signed   By: Ena Dawley   On: 01/09/2018 10:35   1. Left Main:  No significant stenosis.  2. LAD: No significant stenosis. 3. LCX: No significant stenosis. 4. RCA: No significant stenosis.  IMPRESSION: 1.  CT FFR analysis didn't show any significant stenosis.     Recent Labs: 09/24/2018: ALT 20; BUN 8; Creatinine, Ser 0.78; Hemoglobin 12.6; Platelets 350.0; Potassium 2.9; Pro B Natriuretic peptide (BNP) 36.0; Sodium 142; TSH 1.56  Recent Lipid Panel    Component Value Date/Time   CHOL 169 03/06/2018 1510   TRIG 101 03/06/2018 1510   HDL 88 03/06/2018 1510   CHOLHDL 1.9 03/06/2018 1510   LDLCALC 61 03/06/2018 1510    Physical Exam:    VS:  BP 138/62   Pulse 64   Ht 5' 5.5" (1.664 m)  Wt 191 lb (86.6 kg)   SpO2 99%   BMI 31.30 kg/m     Wt Readings from Last 3 Encounters:  02/13/19 191 lb (86.6 kg)  12/14/18 168 lb (76.2 kg)  10/23/18 168 lb (76.2 kg)     GEN:  Patient is in no acute distress HEENT: Normal NECK: No JVD; No carotid bruits LYMPHATICS: No lymphadenopathy CARDIAC: Hear sounds regular, 2/6 systolic murmur at the apex. RESPIRATORY:  Clear to auscultation without rales, wheezing or rhonchi  ABDOMEN: Soft, non-tender, non-distended MUSCULOSKELETAL:  No edema; No deformity  SKIN: Warm and dry NEUROLOGIC:  Alert and oriented x 3 PSYCHIATRIC:  Normal affect   Signed, Deborah Lindau, MD  02/13/2019 10:52 AM    [place]

## 2019-02-14 DIAGNOSIS — M1990 Unspecified osteoarthritis, unspecified site: Secondary | ICD-10-CM | POA: Diagnosis not present

## 2019-02-14 DIAGNOSIS — M79676 Pain in unspecified toe(s): Secondary | ICD-10-CM | POA: Diagnosis not present

## 2019-02-14 DIAGNOSIS — D473 Essential (hemorrhagic) thrombocythemia: Secondary | ICD-10-CM | POA: Diagnosis not present

## 2019-02-14 DIAGNOSIS — Z23 Encounter for immunization: Secondary | ICD-10-CM | POA: Diagnosis not present

## 2019-02-14 DIAGNOSIS — R945 Abnormal results of liver function studies: Secondary | ICD-10-CM | POA: Diagnosis not present

## 2019-02-20 DIAGNOSIS — M19012 Primary osteoarthritis, left shoulder: Secondary | ICD-10-CM | POA: Diagnosis not present

## 2019-02-20 DIAGNOSIS — M25512 Pain in left shoulder: Secondary | ICD-10-CM | POA: Diagnosis not present

## 2019-02-21 DIAGNOSIS — D509 Iron deficiency anemia, unspecified: Secondary | ICD-10-CM | POA: Diagnosis not present

## 2019-02-21 NOTE — Progress Notes (Deleted)
NEUROLOGY FOLLOW UP OFFICE NOTE  Deborah Hutchinson [id number]  HISTORY OF PRESENT ILLNESS: Deborah Hutchinson is a 73 year old woman with iron-deficiency anemia, type 2 diabetes mellitus, CAD, hypertension who follows up for headache.  CTA personally reviewed.  UPDATE: To evaluate headache and tinnitus, CTA of head and neck was performed on 11/12/2018 which demonstrated intracranial atherosclerosis with severe right and moderate left cavernous ICA stenoses but no no large vessel occlusion or aneurysm.  Cervical carotid atherosclerosis noted with 40% proximal left ICA stenosis but no significant stenosis of the right ICA.    Due to her headaches, tinnitus and findings of severe cavernous ICA stenoses, and with discussion with her cardiologist, ASA was switched to Plavix.   HISTORY: She began having headaches in 2017-2018.  They only occur when she has episodes of dyspnea.  She suddenly feels short of breath and lightheaded but doesn't pass out.  It is not exertional and happens at rest.  It does not occur out of sleep.  She has asthma and chronic bronchitis.  When these episodes occur, she develops 10/10 right sided stabbing headache.  She has associated weakness but denies associated neck pain, nausea, vomiting, photophobia, phonophobia, visual disturbance or unilateral numbness or weakness.  It typically lasts 5 minutes, about as long as the shortness of breath until she uses her inhaler.  Frequency varies.  She may have a week without episodes or twice a week or some weeks it may occur several times.  She also has longstanding history of tinnitus, which she describes as sounding like "bees" in her head.  She was evaluated by ENT in April 2019.  Hearing test and tympanogram were unremarkable.  When she gets these headaches, the ringing sounds louder.  She denies prior history of headaches.  Current NSAIDS:  ASA 81mg  Current analgesics:  tramadol Current triptans:  none Current ergotamine:  none Current  anti-emetic:  none Current muscle relaxants:  none Current anti-anxiolytic:  none Current sleep aide:  none Current Antihypertensive medications:  Imdur, hydralazine, Norvasc Current Antidepressant medications:  Lexapro 10mg , Wellbutrin Current Anticonvulsant medications:  Gabapentin 300mg  three times daily Current anti-CGRP:  none Current Vitamins/Herbal/Supplements:  none Current Antihistamines/Decongestants:  none Other therapy:  none Hormone/birth control:  none   PAST MEDICAL HISTORY: Past Medical History:  Diagnosis Date  . Angina    normal stress test  . Anxiety   . Arthritis   . Asthma   . Bronchitis   . 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 Diabetes mellitus   . Diabetes mellitus due to underlying condition with unspecified complications ([place]) 0000000  . Dyslipidemia 06/10/2015  . Essential hypertension 06/10/2015  . GERD (gastroesophageal reflux disease)   . H/O hiatal hernia   . Heart murmur   . Hyperlipemia   . Hypertension   . Nonspecific (abnormal) findings on radiological and other examination of gastrointestinal tract 05/19/2011  . Pain and swelling of toe of right foot 07/15/2016  . Palpitations 06/02/2016  . Shortness of breath    on  excertion  . Sleep apnea     MEDICATIONS: Current Outpatient Medications on File Prior to Visit  Medication Sig Dispense Refill  . albuterol (PROVENTIL HFA;VENTOLIN HFA) 108 (90 Base) MCG/ACT inhaler Inhale 2 puffs into the lungs every 6 (six) hours as needed for wheezing or shortness of breath. 3 Inhaler 3  . amLODipine (NORVASC) 10 MG tablet Take 10 mg by mouth daily.    Marland Kitchen  BD PEN NEEDLE NANO U/F 32G X 4 MM MISC     . budesonide-formoterol (SYMBICORT) 160-4.5 MCG/ACT inhaler Inhale 2 puffs into the lungs 2 (two) times daily. 3 Inhaler 3  . buPROPion (WELLBUTRIN XL) 300 MG 24 hr tablet Take 300 mg by mouth daily.     . Cholecalciferol (VITAMIN D PO) Take  5,000 Units by mouth 2 (two) times daily.     . clopidogrel (PLAVIX) 75 MG tablet Take 1 tablet (75 mg total) by mouth daily. 30 tablet 11  . dicyclomine (BENTYL) 20 MG tablet Take 20 mg by mouth every 6 (six) hours.    Marland Kitchen escitalopram (LEXAPRO) 10 MG tablet Take 10 mg by mouth daily.    . famotidine (PEPCID) 40 MG tablet Take 1 tablet (40 mg total) by mouth 2 (two) times daily. 180 tablet 3  . fluticasone (VERAMYST) 27.5 MCG/SPRAY nasal spray Place 2 sprays into the nose daily.    Marland Kitchen gabapentin (NEURONTIN) 300 MG capsule Take 300 mg by mouth 3 (three) times daily.    . hydrALAZINE (APRESOLINE) 25 MG tablet Take 37.5 mg by mouth 3 (three) times daily.    . isosorbide mononitrate (IMDUR) 60 MG 24 hr tablet Take 120 mg by mouth daily.    . Lancets (ACCU-CHEK MULTICLIX) lancets     . linaclotide (LINZESS) 72 MCG capsule Take 72 mcg by mouth as needed.    Marland Kitchen losartan-hydrochlorothiazide (HYZAAR) 100-25 MG tablet Take 1 tablet by mouth daily.    . metFORMIN (GLUCOPHAGE) 500 MG tablet Take 500 mg by mouth 2 (two) times daily with a meal.     . montelukast (SINGULAIR) 10 MG tablet Take 10 mg by mouth daily.    . Multiple Vitamin (MULTIVITAMIN) capsule Take 1 capsule by mouth daily.      . nitroGLYCERIN (NITROSTAT) 0.4 MG SL tablet DISSOLVE ONE TABLET UNDER THE TONGUE EVERY 5 MINUTES AS NEEDED FOR CHEST PAIN.  DO NOT EXCEED A TOTAL OF 3 DOSES IN 15 MINUTES 25 tablet 5  . omeprazole (PRILOSEC) 20 MG capsule Take 20 mg by mouth 2 (two) times daily before a meal.     . simvastatin (ZOCOR) 10 MG tablet Take 10 mg by mouth daily.    . traMADol (ULTRAM) 50 MG tablet Take 50 mg by mouth 2 (two) times daily as needed for pain.    Marland Kitchen VICTOZA 18 MG/3ML SOPN 12 mg daily.      No current facility-administered medications on file prior to visit.     ALLERGIES: Allergies  Allergen Reactions  . Penicillins Rash    Reaction was 30years ago  Has never taken again    FAMILY HISTORY: Family History  Problem  Relation Age of Onset  . Diabetes Sister   . Hypertension Daughter   . Colon cancer Neg Hx    SOCIAL HISTORY: Social History   Socioeconomic History  . Marital status: Single    Spouse name: Not on file  . Number of children: Not on file  . Years of education: Not on file  . Highest education level: Not on file  Occupational History  . Not on file  Social Needs  . Financial resource strain: Not on file  . Food insecurity    Worry: Not on file    Inability: Not on file  . Transportation needs    Medical: Not on file    Non-medical: Not on file  Tobacco Use  . Smoking status: Former Smoker    Packs/day: 1.00  Quit date: 05/17/2011    Years since quitting: 7.7  . Smokeless tobacco: Never Used  Substance and Sexual Activity  . Alcohol use: No  . Drug use: No  . Sexual activity: Never  Lifestyle  . Physical activity    Days per week: Not on file    Minutes per session: Not on file  . Stress: Not on file  Relationships  . Social Herbalist on phone: Not on file    Gets together: Not on file    Attends religious service: Not on file    Active member of club or organization: Not on file    Attends meetings of clubs or organizations: Not on file    Relationship status: Not on file  . Intimate partner violence    Fear of current or ex partner: Not on file    Emotionally abused: Not on file    Physically abused: Not on file    Forced sexual activity: Not on file  Other Topics Concern  . Not on file  Social History Narrative  . Not on file    REVIEW OF SYSTEMS: Constitutional: No fevers, chills, or sweats, no generalized fatigue, change in appetite Eyes: No visual changes, double vision, eye pain Ear, nose and throat: No hearing loss, ear pain, nasal congestion, sore throat Cardiovascular: No chest pain, palpitations Respiratory:  No shortness of breath at rest or with exertion, wheezes GastrointestinaI: No nausea, vomiting, diarrhea, abdominal pain,  fecal incontinence Genitourinary:  No dysuria, urinary retention or frequency Musculoskeletal:  No neck pain, back pain Integumentary: No rash, pruritus, skin lesions Neurological: as above Psychiatric: No depression, insomnia, anxiety Endocrine: No palpitations, fatigue, diaphoresis, mood swings, change in appetite, change in weight, increased thirst Hematologic/Lymphatic:  No purpura, petechiae. Allergic/Immunologic: no itchy/runny eyes, nasal congestion, recent allergic reactions, rashes  PHYSICAL EXAM: *** General: No acute distress.  Patient appears well-groomed.   Head:  Normocephalic/atraumatic Eyes:  Fundi examined but not visualized Neck: supple, no paraspinal tenderness, full range of motion Heart:  Regular rate and rhythm Lungs:  Clear to auscultation bilaterally Back: No paraspinal tenderness Neurological Exam: alert and oriented to person, place, and time. Attention span and concentration intact, recent and remote memory intact, fund of knowledge intact.  Speech fluent and not dysarthric, language intact.  CN II-XII intact. Bulk and tone normal, muscle strength 5/5 throughout.  Sensation to light touch, temperature and vibration intact.  Deep tendon reflexes 2+ throughout, toes downgoing.  Finger to nose and heel to shin testing intact.  Gait normal, Romberg negative.  IMPRESSION: ***  PLAN: ***  Deborah Clines, DO  CC: ***

## 2019-02-25 ENCOUNTER — Telehealth: Payer: Self-pay | Admitting: Neurology

## 2019-02-25 ENCOUNTER — Encounter

## 2019-02-25 ENCOUNTER — Ambulatory Visit: Payer: Medicare HMO | Admitting: Neurology

## 2019-02-25 NOTE — Telephone Encounter (Signed)
Pt states that she is on Plavix 75 mg  She is having a steroid injection for pinch nerve in back and is wondering if she can go off of the this medication for 5 days prior to injection.  Injection is not scheduled yet. She states that Orthopedic clinic told her she needed to be off of medication prior to injection.

## 2019-02-25 NOTE — Telephone Encounter (Signed)
That is okay.

## 2019-02-25 NOTE — Telephone Encounter (Signed)
Patient is calling in about medication questions- something about her blood thinner and did she need to be off of it for 5 days. She seemed a little confused and wasn't sure which Doctor's office she was needing to call.  Thanks!

## 2019-02-25 NOTE — Telephone Encounter (Signed)
Called patient back she was made aware

## 2019-02-28 DIAGNOSIS — E663 Overweight: Secondary | ICD-10-CM | POA: Diagnosis not present

## 2019-02-28 DIAGNOSIS — R945 Abnormal results of liver function studies: Secondary | ICD-10-CM | POA: Diagnosis not present

## 2019-02-28 DIAGNOSIS — Z79899 Other long term (current) drug therapy: Secondary | ICD-10-CM | POA: Diagnosis not present

## 2019-02-28 DIAGNOSIS — M1990 Unspecified osteoarthritis, unspecified site: Secondary | ICD-10-CM | POA: Diagnosis not present

## 2019-02-28 DIAGNOSIS — I10 Essential (primary) hypertension: Secondary | ICD-10-CM | POA: Diagnosis not present

## 2019-02-28 DIAGNOSIS — Z6827 Body mass index (BMI) 27.0-27.9, adult: Secondary | ICD-10-CM | POA: Diagnosis not present

## 2019-02-28 DIAGNOSIS — E119 Type 2 diabetes mellitus without complications: Secondary | ICD-10-CM | POA: Diagnosis not present

## 2019-02-28 DIAGNOSIS — E1140 Type 2 diabetes mellitus with diabetic neuropathy, unspecified: Secondary | ICD-10-CM | POA: Diagnosis not present

## 2019-02-28 DIAGNOSIS — K219 Gastro-esophageal reflux disease without esophagitis: Secondary | ICD-10-CM | POA: Diagnosis not present

## 2019-03-04 ENCOUNTER — Telehealth: Payer: Self-pay | Admitting: Cardiology

## 2019-03-04 NOTE — Telephone Encounter (Signed)
Patient was late for appt with [facility] and now they cant see her again til Frb and she wants to know is [facility] can call and get it sooner for her??

## 2019-03-07 DIAGNOSIS — Z79899 Other long term (current) drug therapy: Secondary | ICD-10-CM | POA: Diagnosis not present

## 2019-03-07 DIAGNOSIS — M545 Low back pain: Secondary | ICD-10-CM | POA: Diagnosis not present

## 2019-03-07 DIAGNOSIS — M4316 Spondylolisthesis, lumbar region: Secondary | ICD-10-CM | POA: Diagnosis not present

## 2019-03-07 DIAGNOSIS — E119 Type 2 diabetes mellitus without complications: Secondary | ICD-10-CM | POA: Diagnosis not present

## 2019-03-20 DIAGNOSIS — E663 Overweight: Secondary | ICD-10-CM | POA: Diagnosis not present

## 2019-03-20 DIAGNOSIS — R32 Unspecified urinary incontinence: Secondary | ICD-10-CM | POA: Diagnosis not present

## 2019-03-20 DIAGNOSIS — R159 Full incontinence of feces: Secondary | ICD-10-CM | POA: Diagnosis not present

## 2019-03-20 DIAGNOSIS — I10 Essential (primary) hypertension: Secondary | ICD-10-CM | POA: Diagnosis not present

## 2019-03-20 DIAGNOSIS — Z6827 Body mass index (BMI) 27.0-27.9, adult: Secondary | ICD-10-CM | POA: Diagnosis not present

## 2019-03-24 ENCOUNTER — Other Ambulatory Visit: Payer: Self-pay | Admitting: Gastroenterology

## 2019-04-03 ENCOUNTER — Ambulatory Visit: Payer: Medicare HMO | Admitting: Gastroenterology

## 2019-04-09 DIAGNOSIS — L309 Dermatitis, unspecified: Secondary | ICD-10-CM | POA: Diagnosis not present

## 2019-04-09 DIAGNOSIS — E559 Vitamin D deficiency, unspecified: Secondary | ICD-10-CM | POA: Diagnosis not present

## 2019-04-09 DIAGNOSIS — L989 Disorder of the skin and subcutaneous tissue, unspecified: Secondary | ICD-10-CM | POA: Diagnosis not present

## 2019-04-09 DIAGNOSIS — M79676 Pain in unspecified toe(s): Secondary | ICD-10-CM | POA: Diagnosis not present

## 2019-04-11 DIAGNOSIS — L820 Inflamed seborrheic keratosis: Secondary | ICD-10-CM | POA: Diagnosis not present

## 2019-04-11 DIAGNOSIS — L219 Seborrheic dermatitis, unspecified: Secondary | ICD-10-CM | POA: Diagnosis not present

## 2019-04-11 DIAGNOSIS — L918 Other hypertrophic disorders of the skin: Secondary | ICD-10-CM | POA: Diagnosis not present

## 2019-04-14 DIAGNOSIS — R05 Cough: Secondary | ICD-10-CM | POA: Diagnosis not present

## 2019-04-14 DIAGNOSIS — Z20828 Contact with and (suspected) exposure to other viral communicable diseases: Secondary | ICD-10-CM | POA: Diagnosis not present

## 2019-04-14 DIAGNOSIS — J019 Acute sinusitis, unspecified: Secondary | ICD-10-CM | POA: Diagnosis not present

## 2019-04-14 DIAGNOSIS — J029 Acute pharyngitis, unspecified: Secondary | ICD-10-CM | POA: Diagnosis not present

## 2019-04-19 DIAGNOSIS — J019 Acute sinusitis, unspecified: Secondary | ICD-10-CM | POA: Diagnosis not present

## 2019-04-19 DIAGNOSIS — M1990 Unspecified osteoarthritis, unspecified site: Secondary | ICD-10-CM | POA: Diagnosis not present

## 2019-04-19 DIAGNOSIS — R05 Cough: Secondary | ICD-10-CM | POA: Diagnosis not present

## 2019-04-22 ENCOUNTER — Telehealth (INDEPENDENT_AMBULATORY_CARE_PROVIDER_SITE_OTHER): Payer: Medicare HMO | Admitting: Gastroenterology

## 2019-04-22 ENCOUNTER — Other Ambulatory Visit: Payer: Self-pay

## 2019-04-22 ENCOUNTER — Encounter: Payer: Self-pay | Admitting: Gastroenterology

## 2019-04-22 VITALS — Ht 65.5 in | Wt 182.0 lb

## 2019-04-22 DIAGNOSIS — K219 Gastro-esophageal reflux disease without esophagitis: Secondary | ICD-10-CM | POA: Diagnosis not present

## 2019-04-22 DIAGNOSIS — R1310 Dysphagia, unspecified: Secondary | ICD-10-CM

## 2019-04-22 NOTE — Progress Notes (Signed)
IMPRESSION and PLAN:    #1.  GERD with large HH with eso dysphagia. S/P EGD 12/14/2018 with dil (50Fr), large hiatal hernia, hyperplastic gastric polyps. #2.  Chronic constipation (likely exacerbated by medications including calcium channel blockers and Cox 2 inhibitors). Neg CT AP 02/2018. #3.  Internal hemorrhoids. #4.  H/O TAs s/p colonoscopy 12/14/2018. Next due 12/2021.  Plan: - UGI series (Ba tab as well) - Crush larger pills. - Continue omeprazole 20 mg p.o. BID. Famotidine 40mg  po qhs. - Raise HOB (bricks) 30 to 45 degrees. - Follow-up in 6 weeks in person, earlier in case of any problems.      HPI:    Chief Complaint:   Deborah Hutchinson is a 73 y.o. female  For follow-up visit "food stops at top of chest"- then throws up Has been happening over the last 2 months Intermittent, mostly with solids.  Did feel better after dilatation.  No weight loss.  In fact she has gained weight   Constipation is better with Linzess every other day.  She would have diarrhea if she takes it every day. No fever or chills Denies having any melena or hematochezia  Past GI procedures: -EGD 12/14/2018: Large hiatal hernia, hyperplastic gastric polyps, eso stricture s/p dilatation 9fr 08/17/2015 moderate hiatal hernia, negative small bowel biopsies -Colonoscopy 12/14/2018-colonic polyps s/p polypectomy, diverticulosis. Bx- TA, 08/17/2015 8 mm sessile colon polyp status post polypectomy, mild pancolonic diverticulosis, highly redundant colon.  Biopsies tubular adenoma.  -CT AP with contrast 02/09/2018: Negative for acute abnormalities.,  Stable left adrenal adenoma, small right inguinal hernia. Past Medical History:  Diagnosis Date  . Angina    normal stress test  . Anxiety   . Arthritis   . Asthma   . Bronchitis   . 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 Diabetes mellitus   . Diabetes mellitus due to underlying condition  with unspecified complications ([place]) 0000000  . Dyslipidemia 06/10/2015  . Essential hypertension 06/10/2015  . GERD (gastroesophageal reflux disease)   . H/O hiatal hernia   . Heart murmur   . Hyperlipemia   . Hypertension   . Nonspecific (abnormal) findings on radiological and other examination of gastrointestinal tract 05/19/2011  . Pain and swelling of toe of right foot 07/15/2016  . Palpitations 06/02/2016  . Shortness of breath    on  excertion  . Sleep apnea     Current Outpatient Medications  Medication Sig Dispense Refill  . albuterol (PROVENTIL HFA;VENTOLIN HFA) 108 (90 Base) MCG/ACT inhaler Inhale 2 puffs into the lungs every 6 (six) hours as needed for wheezing or shortness of breath. 3 Inhaler 3  . amLODipine (NORVASC) 10 MG tablet Take 10 mg by mouth daily.    . BD PEN NEEDLE NANO U/F 32G X 4 MM MISC     . benzonatate (TESSALON) 200 MG capsule Take 200 mg by mouth 2 (two) times daily as needed for cough.    . budesonide-formoterol (SYMBICORT) 160-4.5 MCG/ACT inhaler Inhale 2 puffs into the lungs 2 (two) times daily. 3 Inhaler 3  . buPROPion (WELLBUTRIN XL) 300 MG 24 hr tablet Take 300 mg by mouth daily.     . Cholecalciferol (VITAMIN D PO) Take 5,000 Units by mouth as directed. Every 2 weeks    . clopidogrel (PLAVIX) 75 MG tablet Take 1 tablet (75 mg total) by mouth daily. 30 tablet 11  . dicyclomine (BENTYL) 20 MG tablet Take  20 mg by mouth every 6 (six) hours as needed.     . doxycycline (VIBRAMYCIN) 100 MG capsule 1 capsule 2 (two) times daily.    Marland Kitchen escitalopram (LEXAPRO) 10 MG tablet Take 10 mg by mouth daily.    . famotidine (PEPCID) 40 MG tablet Take 1 tablet (40 mg total) by mouth 2 (two) times daily. 180 tablet 3  . fluconazole (DIFLUCAN) 150 MG tablet 1 tablet. Take one tablet 2 times a week    . fluticasone (VERAMYST) 27.5 MCG/SPRAY nasal spray Place 2 sprays into the nose daily.    Marland Kitchen gabapentin (NEURONTIN) 300 MG capsule Take 300 mg by mouth 3 (three) times daily as  needed.     . hydrALAZINE (APRESOLINE) 25 MG tablet Take 37.5 mg by mouth 3 (three) times daily.    . isosorbide mononitrate (IMDUR) 60 MG 24 hr tablet Take 120 mg by mouth daily.    . Lancets (ACCU-CHEK MULTICLIX) lancets     . linaclotide (LINZESS) 72 MCG capsule Take 72 mcg by mouth as needed.    Marland Kitchen losartan-hydrochlorothiazide (HYZAAR) 100-25 MG tablet Take 1 tablet by mouth daily.    . metFORMIN (GLUCOPHAGE) 500 MG tablet Take 500 mg by mouth 2 (two) times daily with a meal.     . montelukast (SINGULAIR) 10 MG tablet Take 10 mg by mouth at bedtime.     . Multiple Vitamin (MULTIVITAMIN) capsule Take 1 capsule by mouth daily.      Marland Kitchen omeprazole (PRILOSEC) 20 MG capsule Take 20 mg by mouth 2 (two) times daily before a meal.     . predniSONE (STERAPRED UNI-PAK 21 TAB) 10 MG (21) TBPK tablet Take 10 mg by mouth daily. Take 1 tablet 6 times daily and decrease each tablet daily until gone    . simvastatin (ZOCOR) 10 MG tablet Take 10 mg by mouth daily.    Marland Kitchen VICTOZA 18 MG/3ML SOPN 12 mg daily.     Marland Kitchen ketoconazole (NIZORAL) 2 % shampoo Wash 2 times a week    . nitroGLYCERIN (NITROSTAT) 0.4 MG SL tablet DISSOLVE ONE TABLET UNDER THE TONGUE EVERY 5 MINUTES AS NEEDED FOR CHEST PAIN.  DO NOT EXCEED A TOTAL OF 3 DOSES IN 15 MINUTES (Patient not taking: Reported on 04/22/2019) 25 tablet 5  . traMADol (ULTRAM) 50 MG tablet Take 50 mg by mouth 2 (two) times daily as needed for pain.     No current facility-administered medications for this visit.    Past Surgical History:  Procedure Laterality Date  . ABDOMINAL HYSTERECTOMY    . ABDOMINAL HYSTERECTOMY    . BREAST SURGERY     begin mass,left  breast  . COLONOSCOPY  08/17/2015   Colonic polyp status post polypectomy. Mild pancolonic diverticulosis. Highly redundant colon.   Marland Kitchen DILATION AND CURETTAGE OF UTERUS     one  . ESOPHAGOGASTRODUODENOSCOPY  08/17/2015   Moderate hiatal hernia. Otherwise noraml EGD.   . EUS  05/19/2011   Procedure: UPPER  ENDOSCOPIC ULTRASOUND (EUS) LINEAR;  Surgeon: Owens Loffler, MD;  Location: [facility];  Service: Endoscopy;  Laterality: N/A;  radial linear   . HYSTEROTOMY    . KNEE SURGERY    . LEFT HEART CATH AND CORONARY ANGIOGRAPHY    . right thumb     tendon repaire  . SHOULDER SURGERY     rotator cuff repair  . WRIST SURGERY     rigth    Family History  Problem Relation Age of Onset  . Diabetes  Sister   . Hypertension Daughter   . Colon cancer Neg Hx     Social History   Tobacco Use  . Smoking status: Former Smoker    Packs/day: 1.00    Quit date: 05/17/2011    Years since quitting: 7.9  . Smokeless tobacco: Never Used  Substance Use Topics  . Alcohol use: No  . Drug use: No    Allergies  Allergen Reactions  . Penicillins Rash    Reaction was 30years ago  Has never taken again     Review of Systems: All systems reviewed and negative except where noted in HPI.    Physical Exam:     Ht 5' 5.5" (1.664 m)   Wt 182 lb (82.6 kg)   BMI 29.83 kg/m  Not examined since it was a televisit.  I connected with  Deborah Hutchinson on 04/22/19 by a phone visit (video visit failed) and verified that I am speaking with the correct person using two identifiers.   I discussed the limitations of evaluation and management by telemedicine. The patient expressed understanding and agreed to proceed.  Time spent: Call, coordination of care, review of records: 25 min      Jla Reynolds,MD 04/22/2019, 11:02 AM   CC [name], MD

## 2019-04-22 NOTE — Patient Instructions (Addendum)
If you are age 75 or older, your body mass index should be between 23-30. Your Body mass index is 29.83 kg/m. If this is out of the aforementioned range listed, please consider follow up with your Primary Care Provider.  If you are age 54 or younger, your body mass index should be between 19-25. Your Body mass index is 29.83 kg/m. If this is out of the aformentioned range listed, please consider follow up with your Primary Care Provider.   You have been scheduled for an Upper GI Series and Small Bowel Follow Thru at [facility]. Your appointment is on 04/26/19 at 11am. Please arrive 15 minutes prior to your test for registration. Make certain not to have anything to eat or drink after midnight on the night before your test. If you need to reschedule, please contact radiology at [phone]. --------------------------------------------------------------------------------------------------------------- An upper GI series uses x rays to help diagnose problems of the upper GI tract, which includes the esophagus, stomach, and duodenum. The duodenum is the first part of the small intestine. An upper GI series is conducted by a radiology technologist or a radiologist-a doctor who specializes in x-ray imaging-at a hospital or outpatient center. While sitting or standing in front of an x-ray machine, the patient drinks barium liquid, which is often white and has a chalky consistency and taste. The barium liquid coats the lining of the upper GI tract and makes signs of disease show up more clearly on x rays. X-ray video, called fluoroscopy, is used to view the barium liquid moving through the esophagus, stomach, and duodenum. Additional x rays and fluoroscopy are performed while the patient lies on an x-ray table. To fully coat the upper GI tract with barium liquid, the technologist or radiologist may press on the abdomen or ask the patient to change position. Patients hold still in various positions, allowing the  technologist or radiologist to take x rays of the upper GI tract at different angles. If a technologist conducts the upper GI series, a radiologist will later examine the images to look for problems.  This test typically takes about 1 hour to complete --------------------------------------------------------------------------------------------------------------------------------------------- The Small Bowel Follow Thru examination is used to visualize the entire small bowel (intestines); specifically the connection between the small and large intestine. You will be positioned on a flat x-ray table and an image of your abdomen taken. Then the technologist will show the x-ray to the radiologist. The radiologist will instruct your technologist how much (1-2 cups) barium sulfate you will drink and when to begin taking the timed x-rays, usually 15-30 minutes after you begin drinking. Barium is a harmless substance that will highlight your small intestine by absorbing x-ray. The taste is chalky and it feels very heavy both in the cup and in your stomach.  After the first x-ray is taken and shown to the radiologist, he/she will determine when the next image is to be taken. This is repeated until the barium has reached the end of the small intestine and enters the beginning of the colon (cecum). At such time when the barium spills into the colon, you will be positioned on the x-ray table once again. The radiologist will use a fluoroscopic camera to take some detailed pictures of the connection between your small intestine and colon. The fluoroscope is an x-ray unit that works with a television/computer screen. The radiologist will apply pressure to your abdomen with his/her hand and a lead glove, a plastic paddle, or a paddle with an inflated rubber balloon  on the end. This is to spread apart your loops of intestine so he/she can see all areas.   This test typically takes around 1 hour to complete.  Important  Drink  plenty of water (8-10 cups/day) for a few days following the procedure to avoid constipation and blockage. The barium will make your stools white for a few days. --------------------------------------------------------------------------------------------------------------------------------------------  Crush pills.   Continue Omeprazole 20 mg twice daily. Famotidine 40 mg at bedtime.   Raise head of bed with bricks.   Follow up 6 weeks.   Thank you,  Dr. Jackquline Denmark

## 2019-04-26 ENCOUNTER — Other Ambulatory Visit: Payer: Self-pay

## 2019-04-26 ENCOUNTER — Ambulatory Visit (HOSPITAL_COMMUNITY)
Admission: RE | Admit: 2019-04-26 | Discharge: 2019-04-26 | Disposition: A | Discharge status: Home / Self Care | Payer: Medicare HMO | Source: Ambulatory Visit | Attending: Gastroenterology | Admitting: Gastroenterology

## 2019-04-26 DIAGNOSIS — K219 Gastro-esophageal reflux disease without esophagitis: Secondary | ICD-10-CM | POA: Diagnosis not present

## 2019-04-26 DIAGNOSIS — R1310 Dysphagia, unspecified: Secondary | ICD-10-CM | POA: Insufficient documentation

## 2019-04-26 DIAGNOSIS — K449 Diaphragmatic hernia without obstruction or gangrene: Secondary | ICD-10-CM | POA: Diagnosis not present

## 2019-04-30 ENCOUNTER — Telehealth: Payer: Self-pay | Admitting: Gastroenterology

## 2019-04-30 NOTE — Telephone Encounter (Signed)
Please review UGI series and advise

## 2019-05-06 DIAGNOSIS — R0789 Other chest pain: Secondary | ICD-10-CM | POA: Diagnosis not present

## 2019-05-06 DIAGNOSIS — E119 Type 2 diabetes mellitus without complications: Secondary | ICD-10-CM | POA: Diagnosis not present

## 2019-05-06 DIAGNOSIS — I700 Atherosclerosis of aorta: Secondary | ICD-10-CM | POA: Diagnosis not present

## 2019-05-06 DIAGNOSIS — R079 Chest pain, unspecified: Secondary | ICD-10-CM | POA: Diagnosis not present

## 2019-05-06 DIAGNOSIS — I10 Essential (primary) hypertension: Secondary | ICD-10-CM | POA: Diagnosis not present

## 2019-05-06 DIAGNOSIS — Z03818 Encounter for observation for suspected exposure to other biological agents ruled out: Secondary | ICD-10-CM | POA: Diagnosis not present

## 2019-05-07 ENCOUNTER — Inpatient Hospital Stay (HOSPITAL_COMMUNITY)
Admission: AD | Admit: 2019-05-07 | Payer: Medicare HMO | Source: Other Acute Inpatient Hospital | Admitting: Internal Medicine

## 2019-05-07 DIAGNOSIS — I10 Essential (primary) hypertension: Secondary | ICD-10-CM | POA: Diagnosis not present

## 2019-05-07 DIAGNOSIS — I700 Atherosclerosis of aorta: Secondary | ICD-10-CM | POA: Diagnosis not present

## 2019-05-07 DIAGNOSIS — E876 Hypokalemia: Secondary | ICD-10-CM | POA: Diagnosis not present

## 2019-05-07 DIAGNOSIS — E1140 Type 2 diabetes mellitus with diabetic neuropathy, unspecified: Secondary | ICD-10-CM | POA: Diagnosis not present

## 2019-05-07 DIAGNOSIS — I200 Unstable angina: Secondary | ICD-10-CM | POA: Diagnosis not present

## 2019-05-07 DIAGNOSIS — M545 Low back pain: Secondary | ICD-10-CM | POA: Diagnosis not present

## 2019-05-07 DIAGNOSIS — Z03818 Encounter for observation for suspected exposure to other biological agents ruled out: Secondary | ICD-10-CM | POA: Diagnosis not present

## 2019-05-07 DIAGNOSIS — E278 Other specified disorders of adrenal gland: Secondary | ICD-10-CM | POA: Diagnosis not present

## 2019-05-07 DIAGNOSIS — R079 Chest pain, unspecified: Secondary | ICD-10-CM | POA: Diagnosis not present

## 2019-05-07 DIAGNOSIS — I6529 Occlusion and stenosis of unspecified carotid artery: Secondary | ICD-10-CM | POA: Diagnosis not present

## 2019-05-07 DIAGNOSIS — R0789 Other chest pain: Secondary | ICD-10-CM | POA: Diagnosis not present

## 2019-05-07 DIAGNOSIS — K219 Gastro-esophageal reflux disease without esophagitis: Secondary | ICD-10-CM | POA: Diagnosis not present

## 2019-05-07 DIAGNOSIS — I350 Nonrheumatic aortic (valve) stenosis: Secondary | ICD-10-CM | POA: Diagnosis not present

## 2019-05-07 DIAGNOSIS — I25110 Atherosclerotic heart disease of native coronary artery with unstable angina pectoris: Secondary | ICD-10-CM | POA: Diagnosis not present

## 2019-05-07 DIAGNOSIS — E119 Type 2 diabetes mellitus without complications: Secondary | ICD-10-CM | POA: Diagnosis not present

## 2019-05-07 DIAGNOSIS — E785 Hyperlipidemia, unspecified: Secondary | ICD-10-CM | POA: Diagnosis not present

## 2019-05-08 ENCOUNTER — Encounter (HOSPITAL_COMMUNITY)
Admission: EM | Disposition: A | Discharge status: Home / Self Care | Payer: Self-pay | Source: Ambulatory Visit | Attending: Cardiology

## 2019-05-08 ENCOUNTER — Inpatient Hospital Stay (HOSPITAL_COMMUNITY)
Admission: EM | Admit: 2019-05-08 | Discharge: 2019-05-09 | DRG: 287 | Disposition: A | Discharge status: Home / Self Care | Payer: Medicare HMO | Source: Ambulatory Visit | Attending: Cardiology | Admitting: Cardiology

## 2019-05-08 DIAGNOSIS — I350 Nonrheumatic aortic (valve) stenosis: Secondary | ICD-10-CM | POA: Diagnosis not present

## 2019-05-08 DIAGNOSIS — R079 Chest pain, unspecified: Secondary | ICD-10-CM

## 2019-05-08 DIAGNOSIS — K219 Gastro-esophageal reflux disease without esophagitis: Secondary | ICD-10-CM | POA: Diagnosis present

## 2019-05-08 DIAGNOSIS — Z87891 Personal history of nicotine dependence: Secondary | ICD-10-CM | POA: Diagnosis not present

## 2019-05-08 DIAGNOSIS — Z7902 Long term (current) use of antithrombotics/antiplatelets: Secondary | ICD-10-CM | POA: Diagnosis not present

## 2019-05-08 DIAGNOSIS — I10 Essential (primary) hypertension: Secondary | ICD-10-CM | POA: Diagnosis not present

## 2019-05-08 DIAGNOSIS — R011 Cardiac murmur, unspecified: Secondary | ICD-10-CM | POA: Diagnosis present

## 2019-05-08 DIAGNOSIS — E785 Hyperlipidemia, unspecified: Secondary | ICD-10-CM | POA: Diagnosis present

## 2019-05-08 DIAGNOSIS — I200 Unstable angina: Secondary | ICD-10-CM | POA: Diagnosis not present

## 2019-05-08 DIAGNOSIS — Z20828 Contact with and (suspected) exposure to other viral communicable diseases: Secondary | ICD-10-CM | POA: Diagnosis present

## 2019-05-08 DIAGNOSIS — G4730 Sleep apnea, unspecified: Secondary | ICD-10-CM | POA: Diagnosis present

## 2019-05-08 DIAGNOSIS — K449 Diaphragmatic hernia without obstruction or gangrene: Secondary | ICD-10-CM | POA: Diagnosis present

## 2019-05-08 DIAGNOSIS — I6529 Occlusion and stenosis of unspecified carotid artery: Secondary | ICD-10-CM | POA: Diagnosis not present

## 2019-05-08 DIAGNOSIS — J449 Chronic obstructive pulmonary disease, unspecified: Secondary | ICD-10-CM | POA: Diagnosis not present

## 2019-05-08 DIAGNOSIS — K5730 Diverticulosis of large intestine without perforation or abscess without bleeding: Secondary | ICD-10-CM | POA: Diagnosis present

## 2019-05-08 DIAGNOSIS — E088 Diabetes mellitus due to underlying condition with unspecified complications: Secondary | ICD-10-CM | POA: Diagnosis present

## 2019-05-08 DIAGNOSIS — I2510 Atherosclerotic heart disease of native coronary artery without angina pectoris: Secondary | ICD-10-CM | POA: Diagnosis not present

## 2019-05-08 DIAGNOSIS — Z833 Family history of diabetes mellitus: Secondary | ICD-10-CM | POA: Diagnosis not present

## 2019-05-08 DIAGNOSIS — I25110 Atherosclerotic heart disease of native coronary artery with unstable angina pectoris: Principal | ICD-10-CM | POA: Diagnosis present

## 2019-05-08 DIAGNOSIS — E876 Hypokalemia: Secondary | ICD-10-CM | POA: Diagnosis not present

## 2019-05-08 DIAGNOSIS — R0789 Other chest pain: Secondary | ICD-10-CM | POA: Diagnosis present

## 2019-05-08 DIAGNOSIS — Z880 Allergy status to penicillin: Secondary | ICD-10-CM | POA: Diagnosis not present

## 2019-05-08 DIAGNOSIS — E119 Type 2 diabetes mellitus without complications: Secondary | ICD-10-CM | POA: Diagnosis present

## 2019-05-08 DIAGNOSIS — E278 Other specified disorders of adrenal gland: Secondary | ICD-10-CM | POA: Diagnosis not present

## 2019-05-08 DIAGNOSIS — M1990 Unspecified osteoarthritis, unspecified site: Secondary | ICD-10-CM | POA: Diagnosis present

## 2019-05-08 HISTORY — PX: LEFT HEART CATH AND CORONARY ANGIOGRAPHY: CATH118249

## 2019-05-08 HISTORY — DX: Chest pain, unspecified: R07.9

## 2019-05-08 LAB — GLUCOSE, CAPILLARY: Glucose-Capillary: 103 mg/dL — ABNORMAL HIGH (ref 70–99)

## 2019-05-08 SURGERY — LEFT HEART CATH AND CORONARY ANGIOGRAPHY
Anesthesia: LOCAL

## 2019-05-08 MED ORDER — LABETALOL HCL 5 MG/ML IV SOLN: 10.0000 mg | INTRAVENOUS | Status: AC | PRN

## 2019-05-08 MED ORDER — ALBUTEROL SULFATE (2.5 MG/3ML) 0.083% IN NEBU: 3.0000 mL | INHALATION_SOLUTION | Freq: Four times a day (QID) | RESPIRATORY_TRACT | Status: DC | PRN

## 2019-05-08 MED ORDER — ACETAMINOPHEN 325 MG PO TABS: 650.0000 mg | ORAL_TABLET | ORAL | Status: DC | PRN

## 2019-05-08 MED ORDER — MIDAZOLAM HCL 2 MG/2ML IJ SOLN
INTRAMUSCULAR | Status: DC | PRN
  Administered 2019-05-08 (×2): 1 mg via INTRAVENOUS

## 2019-05-08 MED ORDER — MIDAZOLAM HCL 2 MG/2ML IJ SOLN
INTRAMUSCULAR | Status: AC
  Filled 2019-05-08: qty 2

## 2019-05-08 MED ORDER — NITROGLYCERIN 0.4 MG SL SUBL: 0.4000 mg | SUBLINGUAL_TABLET | SUBLINGUAL | Status: DC | PRN

## 2019-05-08 MED ORDER — LIDOCAINE HCL (PF) 1 % IJ SOLN
INTRAMUSCULAR | Status: DC | PRN
  Administered 2019-05-08: 2 mL

## 2019-05-08 MED ORDER — HYDRALAZINE HCL 20 MG/ML IJ SOLN: 10.0000 mg | INTRAMUSCULAR | Status: AC | PRN

## 2019-05-08 MED ORDER — IOHEXOL 350 MG/ML SOLN
INTRAVENOUS | Status: DC | PRN
  Administered 2019-05-08: 100 mL

## 2019-05-08 MED ORDER — FENTANYL CITRATE (PF) 100 MCG/2ML IJ SOLN
INTRAMUSCULAR | Status: AC
  Filled 2019-05-08: qty 2

## 2019-05-08 MED ORDER — VERAPAMIL HCL 2.5 MG/ML IV SOLN
INTRAVENOUS | Status: AC
  Filled 2019-05-08: qty 2

## 2019-05-08 MED ORDER — SODIUM CHLORIDE 0.9% FLUSH: 3.0000 mL | INTRAVENOUS | Status: DC | PRN

## 2019-05-08 MED ORDER — PANTOPRAZOLE SODIUM 40 MG PO TBEC
40.0000 mg | DELAYED_RELEASE_TABLET | Freq: Every day | ORAL | Status: DC
  Administered 2019-05-09: 40 mg via ORAL
  Filled 2019-05-08: qty 1

## 2019-05-08 MED ORDER — SIMVASTATIN 20 MG PO TABS
10.0000 mg | ORAL_TABLET | Freq: Every day | ORAL | Status: DC
  Administered 2019-05-08: 22:00:00 10 mg via ORAL
  Filled 2019-05-08: qty 1

## 2019-05-08 MED ORDER — ESCITALOPRAM OXALATE 10 MG PO TABS
10.0000 mg | ORAL_TABLET | Freq: Every day | ORAL | Status: DC
  Administered 2019-05-08: 22:00:00 10 mg via ORAL
  Filled 2019-05-08: qty 1

## 2019-05-08 MED ORDER — HEPARIN SODIUM (PORCINE) 1000 UNIT/ML IJ SOLN
INTRAMUSCULAR | Status: DC | PRN
  Administered 2019-05-08: 3000 [IU] via INTRAVENOUS

## 2019-05-08 MED ORDER — LIDOCAINE HCL (PF) 1 % IJ SOLN
INTRAMUSCULAR | Status: AC
  Filled 2019-05-08: qty 30

## 2019-05-08 MED ORDER — AMLODIPINE BESYLATE 10 MG PO TABS
10.0000 mg | ORAL_TABLET | Freq: Every day | ORAL | Status: DC
  Administered 2019-05-09: 10 mg via ORAL
  Filled 2019-05-08: qty 1

## 2019-05-08 MED ORDER — ONDANSETRON HCL 4 MG/2ML IJ SOLN: 4.0000 mg | Freq: Four times a day (QID) | INTRAMUSCULAR | Status: DC | PRN

## 2019-05-08 MED ORDER — SODIUM CHLORIDE 0.9% FLUSH
3.0000 mL | Freq: Two times a day (BID) | INTRAVENOUS | Status: DC
  Administered 2019-05-08 – 2019-05-09 (×2): 3 mL via INTRAVENOUS

## 2019-05-08 MED ORDER — VERAPAMIL HCL 2.5 MG/ML IV SOLN: INTRAVENOUS | Status: DC | PRN

## 2019-05-08 MED ORDER — HEPARIN (PORCINE) IN NACL 1000-0.9 UT/500ML-% IV SOLN
INTRAVENOUS | Status: AC
  Filled 2019-05-08: qty 1000

## 2019-05-08 MED ORDER — LOSARTAN POTASSIUM 50 MG PO TABS
100.0000 mg | ORAL_TABLET | Freq: Every day | ORAL | Status: DC
  Administered 2019-05-09: 09:00:00 100 mg via ORAL
  Filled 2019-05-08: qty 2

## 2019-05-08 MED ORDER — HEPARIN (PORCINE) IN NACL 1000-0.9 UT/500ML-% IV SOLN
INTRAVENOUS | Status: DC | PRN
  Administered 2019-05-08 (×2): 500 mL

## 2019-05-08 MED ORDER — SODIUM CHLORIDE 0.9 % IV SOLN: 250.0000 mL | INTRAVENOUS | Status: DC | PRN

## 2019-05-08 MED ORDER — CLOPIDOGREL BISULFATE 75 MG PO TABS
75.0000 mg | ORAL_TABLET | Freq: Every day | ORAL | Status: DC
  Administered 2019-05-09: 09:00:00 75 mg via ORAL
  Filled 2019-05-08: qty 1

## 2019-05-08 MED ORDER — FENTANYL CITRATE (PF) 100 MCG/2ML IJ SOLN
INTRAMUSCULAR | Status: DC | PRN
  Administered 2019-05-08: 50 ug via INTRAVENOUS
  Administered 2019-05-08: 25 ug via INTRAVENOUS

## 2019-05-08 MED ORDER — FAMOTIDINE 20 MG PO TABS
40.0000 mg | ORAL_TABLET | Freq: Two times a day (BID) | ORAL | Status: DC
  Administered 2019-05-08 – 2019-05-09 (×2): 40 mg via ORAL
  Filled 2019-05-08 (×3): qty 2

## 2019-05-08 MED ORDER — MONTELUKAST SODIUM 10 MG PO TABS
10.0000 mg | ORAL_TABLET | Freq: Every day | ORAL | Status: DC
  Administered 2019-05-08: 22:00:00 10 mg via ORAL
  Filled 2019-05-08: qty 1

## 2019-05-08 MED ORDER — HYDROCHLOROTHIAZIDE 25 MG PO TABS
25.0000 mg | ORAL_TABLET | Freq: Every day | ORAL | Status: DC
  Administered 2019-05-09: 09:00:00 25 mg via ORAL
  Filled 2019-05-08: qty 1

## 2019-05-08 MED ORDER — ISOSORBIDE MONONITRATE ER 60 MG PO TB24
120.0000 mg | ORAL_TABLET | Freq: Every day | ORAL | Status: DC
  Administered 2019-05-09: 09:00:00 120 mg via ORAL
  Filled 2019-05-08: qty 2

## 2019-05-08 MED ORDER — HEPARIN SODIUM (PORCINE) 1000 UNIT/ML IJ SOLN
INTRAMUSCULAR | Status: AC
  Filled 2019-05-08: qty 1

## 2019-05-08 MED ORDER — SODIUM CHLORIDE 0.9 % IV SOLN: INTRAVENOUS | Status: AC

## 2019-05-08 MED ORDER — HYDRALAZINE HCL 50 MG PO TABS
50.0000 mg | ORAL_TABLET | Freq: Three times a day (TID) | ORAL | Status: DC
  Administered 2019-05-08 – 2019-05-09 (×2): 50 mg via ORAL
  Filled 2019-05-08 (×2): qty 1

## 2019-05-08 MED ORDER — BUPROPION HCL ER (XL) 150 MG PO TB24
300.0000 mg | ORAL_TABLET | Freq: Every day | ORAL | Status: DC
  Administered 2019-05-09: 09:00:00 300 mg via ORAL
  Filled 2019-05-08: qty 2

## 2019-05-08 MED ORDER — LOSARTAN POTASSIUM-HCTZ 100-25 MG PO TABS: 1.0000 | ORAL_TABLET | Freq: Every day | ORAL | Status: DC

## 2019-05-08 MED ORDER — MOMETASONE FURO-FORMOTEROL FUM 200-5 MCG/ACT IN AERO
2.0000 | INHALATION_SPRAY | Freq: Two times a day (BID) | RESPIRATORY_TRACT | Status: DC
  Administered 2019-05-09: 09:00:00 2 via RESPIRATORY_TRACT
  Filled 2019-05-08: qty 8.8

## 2019-05-08 SURGICAL SUPPLY — 14 items
CATH 5FR JL3.5 JR4 ANG PIG MP (CATHETERS) ×2 IMPLANT
DEVICE RAD COMP TR BAND LRG (VASCULAR PRODUCTS) ×2 IMPLANT
GLIDESHEATH SLEND SS 6F .021 (SHEATH) ×2 IMPLANT
GUIDEWIRE INQWIRE 1.5J.035X260 (WIRE) ×1 IMPLANT
INQWIRE 1.5J .035X260CM (WIRE) ×2
KIT HEART LEFT (KITS) ×2 IMPLANT
PACK CARDIAC CATHETERIZATION (CUSTOM PROCEDURE TRAY) ×2 IMPLANT
SHEATH PINNACLE 5F 10CM (SHEATH) ×2 IMPLANT
SHEATH PROBE COVER 6X72 (BAG) ×2 IMPLANT
SYR MEDRAD MARK 7 150ML (SYRINGE) ×2 IMPLANT
TRANSDUCER W/STOPCOCK (MISCELLANEOUS) ×2 IMPLANT
TUBING CIL FLEX 10 FLL-RA (TUBING) ×2 IMPLANT
WIRE EMERALD 3MM-J .035X150CM (WIRE) ×2 IMPLANT
WIRE HI TORQ VERSACORE-J 145CM (WIRE) ×2 IMPLANT

## 2019-05-08 NOTE — Interval H&P Note (Signed)
History and Physical Interval Note:  05/08/2019 4:49 PM  Deborah Hutchinson  has presented today for surgery, with the diagnosis of Unstable angina.  The various methods of treatment have been discussed with the patient and family. After consideration of risks, benefits and other options for treatment, the patient has consented to  Procedure(s): LEFT HEART CATH AND CORONARY ANGIOGRAPHY (N/A) as a surgical intervention.  The patient's history has been reviewed, patient examined, no change in status, stable for surgery.  I have reviewed the patient's chart and labs.  Questions were answered to the patient's satisfaction.    Cath Lab Visit (complete for each Cath Lab visit)  Clinical Evaluation Leading to the Procedure:   ACS: No.  Non-ACS:    Anginal Classification: CCS III  Anti-ischemic medical therapy: Maximal Therapy (2 or more classes of medications)  Non-Invasive Test Results: Low-risk stress test findings: cardiac mortality <1%/year  Prior CABG: No previous CABG        Lauree Chandler

## 2019-05-08 NOTE — H&P (Signed)
Cardiology Admission History and Physical:   Patient ID: Deborah Hutchinson MRN: [id number]; DOB: 10/01/1945   Admission date: 05/08/2019  Primary Care Provider: [name], MD Primary Cardiologist: Revankar Primary Electrophysiologist:  None  Chief Complaint:  Chest pain  Patient Profile:   Deborah Hutchinson is a 73 y.o. female with history of DM, HTN, HLD, CAD by coronary CTA in 2019, sleep apnea, mild aortic stenosis and COPD who was admitted to [facility] 05/06/19 with chest pain.   History of Present Illness:   Deborah Hutchinson is a 73 y.o. female with history of DM, HTN, HLD, CAD by coronary CTA in 2019, sleep apnea, mild aortic stenosis and COPD who was admitted to [facility] 05/06/19 with chest pain. She was seen at [facility] by Dr. Bettina Gavia who felt that her chest pain was consistent with unstable angina. Troponin negative. Nuclear stress test with small area of possible ischemia in the inferolateral wall. Pt arrived in the cath lab holding area on transfer from [facility]. No chest pain today. No dyspnea, dizziness or lower extremity edema.   Past Medical History:  Diagnosis Date  . Angina    normal stress test  . Anxiety   . Arthritis   . Asthma   . Bronchitis   . 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 Diabetes mellitus   . Diabetes mellitus due to underlying condition with unspecified complications ([place]) 0000000  . Dyslipidemia 06/10/2015  . Essential hypertension 06/10/2015  . GERD (gastroesophageal reflux disease)   . H/O hiatal hernia   . Heart murmur   . Hyperlipemia   . Hypertension   . Nonspecific (abnormal) findings on radiological and other examination of gastrointestinal tract 05/19/2011  . Pain and swelling of toe of right foot 07/15/2016  . Palpitations 06/02/2016  . Shortness of breath    on  excertion  . Sleep apnea     Past Surgical History:  Procedure Laterality Date   . ABDOMINAL HYSTERECTOMY    . ABDOMINAL HYSTERECTOMY    . BREAST SURGERY     begin mass,left  breast  . COLONOSCOPY  08/17/2015   Colonic polyp status post polypectomy. Mild pancolonic diverticulosis. Highly redundant colon.   Marland Kitchen DILATION AND CURETTAGE OF UTERUS     one  . ESOPHAGOGASTRODUODENOSCOPY  08/17/2015   Moderate hiatal hernia. Otherwise noraml EGD.   . EUS  05/19/2011   Procedure: UPPER ENDOSCOPIC ULTRASOUND (EUS) LINEAR;  Surgeon: Owens Loffler, MD;  Location: [facility];  Service: Endoscopy;  Laterality: N/A;  radial linear   . HYSTEROTOMY    . KNEE SURGERY    . LEFT HEART CATH AND CORONARY ANGIOGRAPHY    . right thumb     tendon repaire  . SHOULDER SURGERY     rotator cuff repair  . WRIST SURGERY     rigth     Medications Prior to Admission: Prior to Admission medications   Medication Sig Start Date End Date Taking? Authorizing Provider  albuterol (PROVENTIL HFA;VENTOLIN HFA) 108 (90 Base) MCG/ACT inhaler Inhale 2 puffs into the lungs every 6 (six) hours as needed for wheezing or shortness of breath. 07/06/17   Juanito Doom, MD  amLODipine (NORVASC) 10 MG tablet Take 10 mg by mouth daily.    [provider]  BD PEN NEEDLE NANO U/F 32G X 4 MM MISC  06/25/13   [provider]  benzonatate (TESSALON) 200 MG capsule Take 200 mg by  mouth 2 (two) times daily as needed for cough.    [provider]  budesonide-formoterol (SYMBICORT) 160-4.5 MCG/ACT inhaler Inhale 2 puffs into the lungs 2 (two) times daily. 12/13/17   Juanito Doom, MD  buPROPion (WELLBUTRIN XL) 300 MG 24 hr tablet Take 300 mg by mouth daily.     [provider]  Cholecalciferol (VITAMIN D PO) Take 5,000 Units by mouth as directed. Every 2 weeks    [provider]  clopidogrel (PLAVIX) 75 MG tablet Take 1 tablet (75 mg total) by mouth daily. 02/13/19   Pieter Partridge, DO  dicyclomine (BENTYL) 20 MG tablet Take 20 mg by mouth every 6 (six) hours as needed.      [provider]  doxycycline (VIBRAMYCIN) 100 MG capsule 1 capsule 2 (two) times daily. 04/14/19   [provider]  escitalopram (LEXAPRO) 10 MG tablet Take 10 mg by mouth daily.    [provider]  famotidine (PEPCID) 40 MG tablet Take 1 tablet (40 mg total) by mouth 2 (two) times daily. 03/06/18   Jackquline Denmark, MD  fluconazole (DIFLUCAN) 150 MG tablet 1 tablet. Take one tablet 2 times a week 04/11/19   [provider]  fluticasone (VERAMYST) 27.5 MCG/SPRAY nasal spray Place 2 sprays into the nose daily.    [provider]  gabapentin (NEURONTIN) 300 MG capsule Take 300 mg by mouth 3 (three) times daily as needed.     [provider]  hydrALAZINE (APRESOLINE) 25 MG tablet Take 37.5 mg by mouth 3 (three) times daily.    [provider]  isosorbide mononitrate (IMDUR) 60 MG 24 hr tablet Take 120 mg by mouth daily.    [provider]  ketoconazole (NIZORAL) 2 % shampoo Wash 2 times a week 04/09/19   [provider]  Lancets (ACCU-CHEK MULTICLIX) lancets  07/15/13   [provider]  linaclotide (LINZESS) 72 MCG capsule Take 72 mcg by mouth as needed.    [provider]  losartan-hydrochlorothiazide (HYZAAR) 100-25 MG tablet Take 1 tablet by mouth daily. 10/10/16   [provider]  metFORMIN (GLUCOPHAGE) 500 MG tablet Take 500 mg by mouth 2 (two) times daily with a meal.     [provider]  montelukast (SINGULAIR) 10 MG tablet Take 10 mg by mouth at bedtime.     [provider]  Multiple Vitamin (MULTIVITAMIN) capsule Take 1 capsule by mouth daily.      [provider]  nitroGLYCERIN (NITROSTAT) 0.4 MG SL tablet DISSOLVE ONE TABLET UNDER THE TONGUE EVERY 5 MINUTES AS NEEDED FOR CHEST PAIN.  DO NOT EXCEED A TOTAL OF 3 DOSES IN 15 MINUTES Patient not taking: Reported on 04/22/2019 12/27/18   Revankar, Reita Cliche, MD  omeprazole (PRILOSEC) 20 MG capsule Take 20 mg by mouth 2 (two)  times daily before a meal.     [provider]  predniSONE (STERAPRED UNI-PAK 21 TAB) 10 MG (21) TBPK tablet Take 10 mg by mouth daily. Take 1 tablet 6 times daily and decrease each tablet daily until gone    [provider]  simvastatin (ZOCOR) 10 MG tablet Take 10 mg by mouth daily.    [provider]  traMADol (ULTRAM) 50 MG tablet Take 50 mg by mouth 2 (two) times daily as needed for pain. 10/20/16   [provider]  VICTOZA 18 MG/3ML SOPN 12 mg daily.  08/03/13   [provider]     Allergies:    Allergies  Allergen Reactions  . Penicillins Rash    Reaction was 30years ago  Has never taken again    Social History:   Social History   Socioeconomic History  . Marital status: Single    Spouse name: Not on file  . Number of children: Not on file  . Years of education: Not on file  . Highest education level: Not on file  Occupational History  . Not on file  Tobacco Use  . Smoking status: Former Smoker    Packs/day: 1.00    Quit date: 05/17/2011    Years since quitting: 7.9  . Smokeless tobacco: Never Used  Substance and Sexual Activity  . Alcohol use: No  . Drug use: No  . Sexual activity: Never  Other Topics Concern  . Not on file  Social History Narrative  . Not on file   Social Determinants of Health   Financial Resource Strain:   . Difficulty of Paying Living Expenses: Not on file  Food Insecurity:   . Worried About Charity fundraiser in the Last Year: Not on file  . Ran Out of Food in the Last Year: Not on file  Transportation Needs:   . Lack of Transportation (Medical): Not on file  . Lack of Transportation (Non-Medical): Not on file  Physical Activity:   . Days of Exercise per Week: Not on file  . Minutes of Exercise per Session: Not on file  Stress:   . Feeling of Stress : Not on file  Social Connections:   . Frequency of Communication with Friends and Family: Not on file  . Frequency of Social Gatherings with  Friends and Family: Not on file  . Attends Religious Services: Not on file  . Active Member of Clubs or Organizations: Not on file  . Attends Archivist Meetings: Not on file  . Marital Status: Not on file  Intimate Partner Violence:   . Fear of Current or Ex-Partner: Not on file  . Emotionally Abused: Not on file  . Physically Abused: Not on file  . Sexually Abused: Not on file    Family History:   The patient's family history includes Diabetes in her sister; Hypertension in her daughter. There is no history of Colon cancer.    ROS:  Please see the history of present illness.  All other ROS reviewed and negative.     Physical Exam/Data:  There were no vitals filed for this visit. No intake or output data in the 24 hours ending 05/08/19 1640 Last 3 Weights 04/22/2019 02/13/2019 12/14/2018  Weight (lbs) 182 lb 191 lb 168 lb  Weight (kg) 82.555 kg 86.637 kg 76.204 kg     There is no height or weight on file to calculate BMI.  General:  Well nourished, well developed, in no acute distress HEENT: normal Lymph: no adenopathy Neck: no JVD Endocrine:  No thryomegaly Vascular: No carotid bruits; FA pulses 2+ bilaterally without bruits  Cardiac:  normal S1, S2; RRR; systolic murmur  Lungs:  clear to auscultation bilaterally, no wheezing, rhonchi or rales  Abd: soft, nontender, no hepatomegaly  Ext: no LE edema Musculoskeletal:  No deformities, BUE and BLE strength normal and equal Skin: warm and dry  Neuro:  CNs 2-12 intact, no focal abnormalities noted Psych:  Normal affect  {  Assessment and Plan:   1. Unstable angina 2. HTN 3. Hyperlipidemia 4. Diabetes mellitus  Will plan cardiac cath today given chest pain and abnormal nuclear stress test. She  is known to have non-obstructive CAD by coronary CTA in 2019.   Severity of Illness: The appropriate patient status for this patient is INPATIENT. Inpatient status is judged to be reasonable and necessary in order to  provide the required intensity of service to ensure the patient's safety. The patient's presenting symptoms, physical exam findings, and initial radiographic and laboratory data in the context of their chronic comorbidities is felt to place them at high risk for further clinical deterioration. Furthermore, it is not anticipated that the patient will be medically stable for discharge from the hospital within 2 midnights of admission. The following factors support the patient status of inpatient.   " The patient's presenting symptoms include chest pain. " The worrisome physical exam findings include  " The initial radiographic and laboratory data are worrisome because of abnormal stress test " The chronic co-morbidities include HTN, HLD, DM  * I certify that at the point of admission it is my clinical judgment that the patient will require inpatient hospital care spanning beyond 2 midnights from the point of admission due to high intensity of service, high risk for further deterioration and high frequency of surveillance required.*   For questions or updates, please contact [place] Please consult [email or web address] for contact info under   Signed, Lauree Chandler, MD  05/08/2019 4:40 PM

## 2019-05-09 ENCOUNTER — Inpatient Hospital Stay (HOSPITAL_COMMUNITY): Payer: Medicare HMO

## 2019-05-09 DIAGNOSIS — R079 Chest pain, unspecified: Secondary | ICD-10-CM

## 2019-05-09 LAB — BASIC METABOLIC PANEL
Anion gap: 8 (ref 5–15)
BUN: 8 mg/dL (ref 8–23)
CO2: 25 mmol/L (ref 22–32)
Calcium: 9.4 mg/dL (ref 8.9–10.3)
Chloride: 109 mmol/L (ref 98–111)
Creatinine, Ser: 0.74 mg/dL (ref 0.44–1.00)
GFR calc Af Amer: >60 mL/min (ref >60)
GFR calc non Af Amer: >60 mL/min (ref >60)
Glucose, Bld: 126 mg/dL — ABNORMAL HIGH (ref 70–99)
Potassium: 3.7 mmol/L (ref 3.5–5.1)
Sodium: 142 mmol/L (ref 135–145)

## 2019-05-09 LAB — CBC
HCT: 35.6 % — ABNORMAL LOW (ref 36.0–46.0)
Hemoglobin: 11.4 g/dL — ABNORMAL LOW (ref 12.0–15.0)
MCH: 30.6 pg (ref 26.0–34.0)
MCHC: 32 g/dL (ref 30.0–36.0)
MCV: 95.4 fL (ref 80.0–100.0)
Platelets: 312 10*3/uL (ref 150–400)
RBC: 3.73 MIL/uL — ABNORMAL LOW (ref 3.87–5.11)
RDW: 14.8 % (ref 11.5–15.5)
WBC: 6.1 10*3/uL (ref 4.0–10.5)
nRBC: 0 % (ref 0.0–0.2)

## 2019-05-09 LAB — ECHOCARDIOGRAM COMPLETE: Weight: 2892.44 oz

## 2019-05-09 LAB — GLUCOSE, CAPILLARY
Glucose-Capillary: 97 mg/dL (ref 70–99)
Glucose-Capillary: 136 mg/dL — ABNORMAL HIGH (ref 70–99)

## 2019-05-09 MED ORDER — HYDRALAZINE HCL 50 MG PO TABS: 50.0000 mg | ORAL_TABLET | Freq: Three times a day (TID) | ORAL | 2 refills | Status: DC

## 2019-05-09 MED ORDER — HYDROCHLOROTHIAZIDE 25 MG PO TABS: 25.0000 mg | ORAL_TABLET | Freq: Every day | ORAL | Status: DC

## 2019-05-09 MED ORDER — PANTOPRAZOLE SODIUM 40 MG PO TBEC: 40.0000 mg | DELAYED_RELEASE_TABLET | Freq: Every day | ORAL | 2 refills | Status: DC

## 2019-05-09 MED ORDER — ISOSORBIDE MONONITRATE ER 120 MG PO TB24: 120.0000 mg | ORAL_TABLET | Freq: Every day | ORAL | 2 refills | Status: DC

## 2019-05-09 MED ORDER — SPIRONOLACTONE 25 MG PO TABS: 25.0000 mg | ORAL_TABLET | Freq: Every day | ORAL | 2 refills | Status: DC

## 2019-05-09 MED ORDER — FUROSEMIDE 20 MG PO TABS: 20.0000 mg | ORAL_TABLET | Freq: Every day | ORAL | Status: DC

## 2019-05-09 MED ORDER — HYDROCHLOROTHIAZIDE 25 MG PO TABS: 25.0000 mg | ORAL_TABLET | Freq: Every day | ORAL | 2 refills | Status: DC

## 2019-05-09 MED ORDER — SPIRONOLACTONE 25 MG PO TABS
25.0000 mg | ORAL_TABLET | Freq: Every day | ORAL | Status: DC
  Administered 2019-05-09: 12:00:00 25 mg via ORAL
  Filled 2019-05-09: qty 1

## 2019-05-09 MED ORDER — LOSARTAN POTASSIUM 100 MG PO TABS: 100.0000 mg | ORAL_TABLET | Freq: Every day | ORAL | 2 refills | Status: DC

## 2019-05-09 NOTE — Discharge Summary (Signed)
Discharge Summary    Patient ID: Deborah Hutchinson MRN: [id number]; DOB: 04/14/1946  Admit date: 05/08/2019 Discharge date: 05/09/2019  Primary Care Provider: [name], MD  Primary Cardiologist: Dr. Lennox Pippins, MD  Discharge Diagnoses    Principal Problem:   Chest pain Active Problems:   Diabetes mellitus due to underlying condition with unspecified complications [facility])   Essential hypertension   CAD (coronary artery disease)   Chest pain of uncertain etiology  Diagnostic Studies/Procedures    LHC 05/08/2019:   Prox RCA lesion is 50% stenosed.  Prox LAD lesion is 40% stenosed.  The left ventricular systolic function is normal.  LV end diastolic pressure is normal.  The left ventricular ejection fraction is greater than 65% by visual estimate.  There is no mitral valve regurgitation.   1. Mild non-obstructive disease in the proximal LAD 2. Moderate non-obstructive disease in the proximal RCA 3. Normal LV systolic function  Medical management of CAD. Explore other causes of chest pain.   Echocardiogram 05/09/2019:  1. Left ventricular ejection fraction, by visual estimation, is 65 to 70%. The left ventricle has hyperdynamic function. There is mildly increased left ventricular hypertrophy. 2. Elevated left atrial and left ventricular end-diastolic pressures. 3. Left ventricular diastolic parameters are consistent with Grade I diastolic dysfunction (impaired relaxation). 4. The left ventricle has no regional wall motion abnormalities. 5. Global right ventricle has normal systolic function.The right ventricular size is normal. No increase in right ventricular wall thickness. 6. Left atrial size was mildly dilated. 7. Right atrial size was normal. 8. Mild mitral annular calcification. 9. The mitral valve is abnormal. Trivial mitral valve regurgitation. 10. The tricuspid valve is grossly normal. 11. Aortic valve area, by VTI measures 1.16 cm. 12. Aortic valve  mean gradient measures 15.4 mmHg. 13. Aortic valve peak gradient measures 31.6 mmHg. 14. The aortic valve is tricuspid. Aortic valve regurgitation is trivial. Moderate aortic valve stenosis. 15. The pulmonic valve was grossly normal. Pulmonic valve regurgitation is not visualized. 16. The inferior vena cava is normal in size with greater than 50% respiratory variability, suggesting right atrial pressure of 3 mmHg.   History of Present Illness     Deborah Hutchinson is a 73 y.o. female with a history of DM, HTN, HLD, CAD by coronary CTA in 2019, sleep apnea, mild aortic stenosis and COPD who was admitted to [facility] 05/06/19 with chest pain.   Hospital Course     Deborah Hutchinson is a 73 y.o. female with a history as stated above who was admitted to [facility] 05/06/19 with chest pain. She was seen at [facility] by Dr. Bettina Gavia and felt that her chest pain was consistent with unstable angina. Troponin was negative. Nuclear stress test with small area of possible ischemia in the inferolateral wall. Pt arrived in the cath lab holding area on transfer from [facility] without chest pain, dyspnea, dizziness or lower extremity edema.   She underwent a cardiac catheterization on 05/08/2019 which showed mild non-obstructive disease in the proximal LAD, moderate non-obstructive disease in the proximal RCA and normal LV systolic function with recommendations for medical management of CAD and possible exploration of other causes of chest pain. Echocardiogram suggested elevated LV filling pressure, and diastolic dysfunction. Aortic stenosis had not progressed significantly since 09/2018. She was noted to be on excellent medical therapy for hypertension. Per Dr. Margaretann Loveless, her diastolic dysfunction, elevated LV filling pressures, and likely episodic hypertension, may be the driver for her shortness of breath and chest pain.  Optimization of her filling pressures is warranted.  Medication  changes: -Continue losartan HCTZ and we will add spironolactone for additional diuresis. This can be titrated as an outpatient.  We could also consider Lasix which we discussed, however she may benefit more from spironolactone -Basic metabolic panel in 1 week to evaluate creatinine and potassium -First available follow-up with Dr. Geraldo Pitter in cardiology for evaluation of chest pain and med titration.  Consultants: None    The patient was seen by Dr. Margaretann Loveless and felt to be stable and ready for discharge today, 05/09/2019. Cath site unremarkable.   Did the patient have an acute coronary syndrome (MI, NSTEMI, STEMI, etc) this admission?:  No                               Did the patient have a percutaneous coronary intervention (stent / angioplasty)?:  No.   _____________  Discharge Vitals Blood pressure (!) 151/85, pulse 76, temperature 97.9 F (36.6 C), temperature source Oral, resp. rate 18, weight 82 kg, SpO2 100 %.  Filed Weights   05/08/19 1700 05/09/19 0636  Weight: 82.6 kg 82 kg   Labs & Radiologic Studies    CBC Recent Labs    05/09/19 0402  WBC 6.1  HGB 11.4*  HCT 35.6*  MCV 95.4  PLT 123456   Basic Metabolic Panel Recent Labs    05/09/19 0402  NA 142  K 3.7  CL 109  CO2 25  GLUCOSE 126*  BUN 8  CREATININE 0.74  CALCIUM 9.4   CARDIAC CATHETERIZATION  Result Date: 05/08/2019  Prox RCA lesion is 50% stenosed.  Prox LAD lesion is 40% stenosed.  The left ventricular systolic function is normal.  LV end diastolic pressure is normal.  The left ventricular ejection fraction is greater than 65% by visual estimate.  There is no mitral valve regurgitation.  1. Mild non-obstructive disease in the proximal LAD 2. Moderate non-obstructive disease in the proximal RCA 3. Normal LV systolic function Medical management of CAD. Explore other causes of chest pain.   DG UGI W DOUBLE CM (HD BA)  Result Date: 04/26/2019 CLINICAL DATA:  Gastroesophageal reflux disease,  dysphagia EXAM: UPPER GI SERIES WITHOUT KUB TECHNIQUE: Routine upper GI series was performed with thin and high density barium. FLUOROSCOPY TIME:  Fluoroscopy Time:  1 minute Radiation Exposure Index (if provided by the fluoroscopic device): 18.8 mGy COMPARISON:  December 31, 2018 FINDINGS: Scout radiograph of the abdomen demonstrates an unremarkable bowel gas pattern. Patient swallowed barium without difficulty. There is no esophageal mass or stricture. Mild fold thickening, which may reflect esophagitis. Moderate hiatal hernia. There is severe gastroesophageal reflux to the upper esophagus. Normal distensibility of the stomach. No evidence of gastric ulcer or mass. Duodenum and visualized proximal jejunum are unremarkable. A 13 mm barium tablet passed without difficulty into the stomach. IMPRESSION: Moderate hiatal hernia and severe gastroesophageal reflux. No esophageal stricture. Mild fold thickening, which could reflect esophagitis. Electronically Signed   By: Macy Mis M.D.   On: 04/26/2019 12:12   ECHOCARDIOGRAM COMPLETE  Result Date: 05/09/2019   ECHOCARDIOGRAM REPORT   Patient Name:   Deborah Hutchinson Date of Exam: 05/09/2019 Medical Rec #:  [id number]     Height:       65.5 in Accession #:    [id number]    Weight:       180.8 lb Date of Birth:  24-Jan-1946  BSA:          1.91 m Patient Age:    73 years      BP:           150/85 mmHg Patient Gender: F             HR:           76 bpm. Exam Location:  Inpatient Procedure: 2D Echo Indications:    Chest Pain 786.50 / R07.9  History:        Patient has prior history of Echocardiogram examinations, most                 recent 09/28/2018. CHF, Aortic Valve Disease,                 Signs/Symptoms:Shortness of Breath and Murmur; Risk                 Factors:Hypertension, Diabetes and Dyslipidemia.  Sonographer:    Darlina Sicilian RDCS Referring Phys: [id number] Nadean Corwin A [place]  1. Left ventricular ejection fraction, by visual estimation, is 65 to  70%. The left ventricle has hyperdynamic function. There is mildly increased left ventricular hypertrophy.  2. Elevated left atrial and left ventricular end-diastolic pressures.  3. Left ventricular diastolic parameters are consistent with Grade I diastolic dysfunction (impaired relaxation).  4. The left ventricle has no regional wall motion abnormalities.  5. Global right ventricle has normal systolic function.The right ventricular size is normal. No increase in right ventricular wall thickness.  6. Left atrial size was mildly dilated.  7. Right atrial size was normal.  8. Mild mitral annular calcification.  9. The mitral valve is abnormal. Trivial mitral valve regurgitation. 10. The tricuspid valve is grossly normal. 11. Aortic valve area, by VTI measures 1.16 cm. 12. Aortic valve mean gradient measures 15.4 mmHg. 13. Aortic valve peak gradient measures 31.6 mmHg. 14. The aortic valve is tricuspid. Aortic valve regurgitation is trivial. Moderate aortic valve stenosis. 15. The pulmonic valve was grossly normal. Pulmonic valve regurgitation is not visualized. 16. The inferior vena cava is normal in size with greater than 50% respiratory variability, suggesting right atrial pressure of 3 mmHg. FINDINGS  Left Ventricle: Left ventricular ejection fraction, by visual estimation, is 65 to 70%. The left ventricle has hyperdynamic function. The left ventricle has no regional wall motion abnormalities. There is mildly increased left ventricular hypertrophy. Left ventricular diastolic parameters are consistent with Grade I diastolic dysfunction (impaired relaxation). Elevated left atrial and left ventricular end-diastolic pressures. Right Ventricle: The right ventricular size is normal. No increase in right ventricular wall thickness. Global RV systolic function is has normal systolic function. Left Atrium: Left atrial size was mildly dilated. Right Atrium: Right atrial size was normal in size Pericardium: There is no  evidence of pericardial effusion. Mitral Valve: The mitral valve is abnormal. There is mild thickening of the mitral valve leaflet(s). Mild mitral annular calcification. Trivial mitral valve regurgitation. Tricuspid Valve: The tricuspid valve is grossly normal. Tricuspid valve regurgitation is trivial. Aortic Valve: The aortic valve is tricuspid. Aortic valve regurgitation is trivial. Moderate aortic stenosis is present. Aortic valve mean gradient measures 15.4 mmHg. Aortic valve peak gradient measures 31.6 mmHg. Aortic valve area, by VTI measures 1.16  cm. Pulmonic Valve: The pulmonic valve was grossly normal. Pulmonic valve regurgitation is not visualized. Pulmonic regurgitation is not visualized. Aorta: The aortic root and ascending aorta are structurally normal, with no evidence of dilitation. Venous: The inferior vena cava is  normal in size with greater than 50% respiratory variability, suggesting right atrial pressure of 3 mmHg. IAS/Shunts: No atrial level shunt detected by color flow Doppler.  LEFT VENTRICLE PLAX 2D LVIDd:         3.30 cm  Diastology LVIDs:         1.90 cm  LV e' lateral:   4.24 cm/s LV PW:         1.10 cm  LV E/e' lateral: 26.2 LV IVS:        1.50 cm  LV e' medial:    4.68 cm/s LVOT diam:     1.70 cm  LV E/e' medial:  23.7 LV SV:         33 ml LV SV Index:   16.73 LVOT Area:     2.27 cm  RIGHT VENTRICLE RV S prime:     13.20 cm/s TAPSE (M-mode): 1.3 cm LEFT ATRIUM             Index       RIGHT ATRIUM           Index LA diam:        3.90 cm 2.05 cm/m  RA Area:     13.60 cm LA Vol (A2C):   57.0 ml 29.91 ml/m RA Volume:   29.20 ml  15.32 ml/m LA Vol (A4C):   75.7 ml 39.72 ml/m LA Biplane Vol: 69.0 ml 36.21 ml/m  AORTIC VALVE AV Area (Vmax):    1.32 cm AV Area (Vmean):   1.36 cm AV Area (VTI):     1.16 cm AV Vmax:           281.00 cm/s AV Vmean:          183.800 cm/s AV VTI:            0.537 m AV Peak Grad:      31.6 mmHg AV Mean Grad:      15.4 mmHg LVOT Vmax:         163.00 cm/s  LVOT Vmean:        110.000 cm/s LVOT VTI:          0.275 m LVOT/AV VTI ratio: 0.51  AORTA Ao Root diam: 2.80 cm Ao Asc diam:  3.40 cm MITRAL VALVE MV Area (PHT): 1.89 cm              SHUNTS MV PHT:        116.29 msec           Systemic VTI:  0.28 m MV Decel Time: 401 msec              Systemic Diam: 1.70 cm MV E velocity: 111.00 cm/s 103 cm/s MV A velocity: 151.00 cm/s 70.3 cm/s MV E/A ratio:  0.74        1.5  Lyman Bishop MD Electronically signed by Lyman Bishop MD Signature Date/Time: 05/09/2019/11:17:26 AM    Final    Disposition   Pt is being discharged home today in good condition.  Follow-up Plans & Appointments   Follow-up Information    Revankar, Reita Cliche, MD Follow up.   Specialty: Cardiology Why: The office will call you soon with date and time of appointment  Contact information: [address] [place] [address] [phone]          Discharge Instructions    Call MD for:  difficulty breathing, headache or visual disturbances   Complete by: As directed    Call MD for:  extreme fatigue   Complete by: As directed    Call MD for:  hives   Complete by: As directed    Call MD for:  persistant dizziness or light-headedness   Complete by: As directed    Call MD for:  persistant nausea and vomiting   Complete by: As directed    Call MD for:  redness, tenderness, or signs of infection (pain, swelling, redness, odor or green/yellow discharge around incision site)   Complete by: As directed    Call MD for:  severe uncontrolled pain   Complete by: As directed    Call MD for:  temperature >100.4   Complete by: As directed    Diet - low sodium heart healthy   Complete by: As directed    Discharge instructions   Complete by: As directed    PLEASE DO NOT [place]!!!!! Also keep a log of you blood pressures and bring back to your follow up appt. Please call the office with any questions.   No driving for 3 days. No lifting over 5 lbs for 1  week. No sexual activity for 1 week. Keep procedure site clean & dry. If you notice increased pain, swelling, bleeding or pus, call/return!  You may shower, but no soaking baths/hot tubs/pools for 1 week.   Patients taking blood thinners should generally stay away from medicines like ibuprofen, Advil, Motrin, naproxen, and Aleve due to risk of stomach bleeding. You may take Tylenol as directed or talk to your primary doctor about alternatives.  Some studies suggest Prilosec/Omeprazole interacts with Plavix. We changed your Prilosec/Omeprazole to the equivalent dose of Protonix for less chance of interaction.   You can restart your Metformin tomorrow, 05/10/19.   You will need lab work in one week.   Dr. Ervin Knack office will call you with date and time of your follow up appointment   Increase activity slowly   Complete by: As directed      Discharge Medications   Allergies as of 05/09/2019      Reactions   Penicillins Rash   Reaction was 30years ago  Has never taken again      Medication List    STOP taking these medications   losartan-hydrochlorothiazide 100-25 MG tablet Commonly known as: HYZAAR   omeprazole 20 MG capsule Commonly known as: PRILOSEC Replaced by: pantoprazole 40 MG tablet     TAKE these medications   accu-chek multiclix lancets   albuterol 108 (90 Base) MCG/ACT inhaler Commonly known as: VENTOLIN HFA Inhale 2 puffs into the lungs every 6 (six) hours as needed for wheezing or shortness of breath.   amLODipine 10 MG tablet Commonly known as: NORVASC Take 10 mg by mouth daily.   BD Pen Needle Nano U/F 32G X 4 MM Misc Generic drug: Insulin Pen Needle   benzonatate 200 MG capsule Commonly known as: TESSALON Take 200 mg by mouth 2 (two) times daily as needed for cough.   budesonide-formoterol 160-4.5 MCG/ACT inhaler Commonly known as: Symbicort Inhale 2 puffs into the lungs 2 (two) times daily.   buPROPion 300 MG 24 hr tablet Commonly known as:  WELLBUTRIN XL Take 300 mg by mouth daily.   clopidogrel 75 MG tablet Commonly known as: PLAVIX Take 1 tablet (75 mg total) by mouth daily.   dicyclomine 20 MG tablet Commonly known as: BENTYL Take 20 mg by mouth every 6 (six) hours as needed.   doxycycline 100 MG capsule Commonly known as: VIBRAMYCIN 1  capsule 2 (two) times daily.   escitalopram 10 MG tablet Commonly known as: LEXAPRO Take 10 mg by mouth daily.   famotidine 40 MG tablet Commonly known as: PEPCID Take 1 tablet (40 mg total) by mouth 2 (two) times daily.   fluconazole 150 MG tablet Commonly known as: DIFLUCAN 1 tablet. Take one tablet 2 times a week   fluticasone 27.5 MCG/SPRAY nasal spray Commonly known as: VERAMYST Place 2 sprays into the nose daily.   gabapentin 300 MG capsule Commonly known as: NEURONTIN Take 300 mg by mouth 3 (three) times daily as needed.   hydrALAZINE 50 MG tablet Commonly known as: APRESOLINE Take 1 tablet (50 mg total) by mouth 3 (three) times daily. What changed:   medication strength  how much to take   hydrochlorothiazide 25 MG tablet Commonly known as: HYDRODIURIL Take 1 tablet (25 mg total) by mouth daily. Start taking on: May 10, 2019   isosorbide mononitrate 120 MG 24 hr tablet Commonly known as: IMDUR Take 1 tablet (120 mg total) by mouth daily. Start taking on: May 10, 2019 What changed: medication strength   ketoconazole 2 % shampoo Commonly known as: NIZORAL Wash 2 times a week   Linzess 72 MCG capsule Generic drug: linaclotide Take 72 mcg by mouth as needed.   losartan 100 MG tablet Commonly known as: COZAAR Take 1 tablet (100 mg total) by mouth daily. Start taking on: May 10, 2019   metFORMIN 500 MG tablet Commonly known as: GLUCOPHAGE Take 500 mg by mouth 2 (two) times daily with a meal.   montelukast 10 MG tablet Commonly known as: SINGULAIR Take 10 mg by mouth at bedtime.   multivitamin capsule Take 1 capsule by mouth  daily.   nitroGLYCERIN 0.4 MG SL tablet Commonly known as: NITROSTAT DISSOLVE ONE TABLET UNDER THE TONGUE EVERY 5 MINUTES AS NEEDED FOR CHEST PAIN.  DO NOT EXCEED A TOTAL OF 3 DOSES IN 15 MINUTES   pantoprazole 40 MG tablet Commonly known as: PROTONIX Take 1 tablet (40 mg total) by mouth daily. Start taking on: May 10, 2019 Replaces: omeprazole 20 MG capsule   predniSONE 10 MG (21) Tbpk tablet Commonly known as: STERAPRED UNI-PAK 21 TAB Take 10 mg by mouth daily. Take 1 tablet 6 times daily and decrease each tablet daily until gone   simvastatin 10 MG tablet Commonly known as: ZOCOR Take 10 mg by mouth daily.   spironolactone 25 MG tablet Commonly known as: ALDACTONE Take 1 tablet (25 mg total) by mouth daily.   traMADol 50 MG tablet Commonly known as: ULTRAM Take 50 mg by mouth 2 (two) times daily as needed for pain.   Victoza 18 MG/3ML Sopn Generic drug: liraglutide 12 mg daily.   VITAMIN D PO Take 5,000 Units by mouth as directed. Every 2 weeks       Outstanding Labs/Studies   BMET in one week   Duration of Discharge Encounter   Greater than 30 minutes including physician time.  Signed, Kathyrn Drown, NP 05/09/2019, 12:13 PM

## 2019-05-09 NOTE — Progress Notes (Signed)
  Echocardiogram 2D Echocardiogram has been performed.  Deborah Hutchinson M 05/09/2019, 9:07 AM

## 2019-05-09 NOTE — Discharge Instructions (Signed)
Radial Site Care  This sheet gives you information about how to care for yourself after your procedure. Your health care provider may also give you more specific instructions. If you have problems or questions, contact your health care provider. What can I expect after the procedure? After the procedure, it is common to have:  Bruising and tenderness at the catheter insertion area. Follow these instructions at home: Medicines  Take over-the-counter and prescription medicines only as told by your health care provider. Insertion site care  Follow instructions from your health care provider about how to take care of your insertion site. Make sure you: ? Wash your hands with soap and water before you change your bandage (dressing). If soap and water are not available, use hand sanitizer. ? Change your dressing as told by your health care provider. ? Leave stitches (sutures), skin glue, or adhesive strips in place. These skin closures may need to stay in place for 2 weeks or longer. If adhesive strip edges start to loosen and curl up, you may trim the loose edges. Do not remove adhesive strips completely unless your health care provider tells you to do that.  Check your insertion site every day for signs of infection. Check for: ? Redness, swelling, or pain. ? Fluid or blood. ? Pus or a bad smell. ? Warmth.  Do not take baths, swim, or use a hot tub until your health care provider approves.  You may shower 24-48 hours after the procedure, or as directed by your health care provider. ? Remove the dressing and gently wash the site with plain soap and water. ? Pat the area dry with a clean towel. ? Do not rub the site. That could cause bleeding.  Do not apply powder or lotion to the site. Activity   For 24 hours after the procedure, or as directed by your health care provider: ? Do not flex or bend the affected arm. ? Do not push or pull heavy objects with the affected arm. ? Do not  drive yourself home from the hospital or clinic. You may drive 24 hours after the procedure unless your health care provider tells you not to. ? Do not operate machinery or power tools.  Do not lift anything that is heavier than 10 lb (4.5 kg), or the limit that you are told, until your health care provider says that it is safe.  Ask your health care provider when it is okay to: ? Return to work or school. ? Resume usual physical activities or sports. ? Resume sexual activity. General instructions  If the catheter site starts to bleed, raise your arm and put firm pressure on the site. If the bleeding does not stop, get help right away. This is a medical emergency.  If you went home on the same day as your procedure, a responsible adult should be with you for the first 24 hours after you arrive home.  Keep all follow-up visits as told by your health care provider. This is important. Contact a health care provider if:  You have a fever.  You have redness, swelling, or yellow drainage around your insertion site. Get help right away if:  You have unusual pain at the radial site.  The catheter insertion area swells very fast.  The insertion area is bleeding, and the bleeding does not stop when you hold steady pressure on the area.  Your arm or hand becomes pale, cool, tingly, or numb. These symptoms may represent a serious problem   that is an emergency. Do not wait to see if the symptoms will go away. Get medical help right away. Call your local emergency services (911 in the [place] Do not drive yourself to the hospital. Summary  After the procedure, it is common to have bruising and tenderness at the site.  Follow instructions from your health care provider about how to take care of your radial site wound. Check the wound every day for signs of infection.  Do not lift anything that is heavier than 10 lb (4.5 kg), or the limit that you are told, until your health care provider says  that it is safe. This information is not intended to replace advice given to you by your health care provider. Make sure you discuss any questions you have with your health care provider. Document Revised: 05/31/2017 Document Reviewed: 05/31/2017 Elsevier Patient Education  2020 [employer]  

## 2019-05-09 NOTE — Progress Notes (Signed)
Progress Note  Patient Name: Deborah Hutchinson Date of Encounter: 05/09/2019  Primary Cardiologist: No primary care provider on file.   Subjective   No recurrent cp overnight  Inpatient Medications    Scheduled Meds: . amLODipine  10 mg Oral Daily  . buPROPion  300 mg Oral Daily  . clopidogrel  75 mg Oral Daily  . escitalopram  10 mg Oral QHS  . famotidine  40 mg Oral BID  . hydrALAZINE  50 mg Oral TID  . losartan  100 mg Oral Daily   And  . hydrochlorothiazide  25 mg Oral Daily  . isosorbide mononitrate  120 mg Oral Daily  . mometasone-formoterol  2 puff Inhalation BID  . montelukast  10 mg Oral QHS  . pantoprazole  40 mg Oral Daily  . simvastatin  10 mg Oral q1800  . sodium chloride flush  3 mL Intravenous Q12H   Continuous Infusions: . sodium chloride     PRN Meds: sodium chloride, acetaminophen, albuterol, nitroGLYCERIN, ondansetron (ZOFRAN) IV, sodium chloride flush   Vital Signs    Vitals:   05/08/19 1926 05/08/19 1941 05/08/19 2140 05/09/19 0636  BP: (!) 142/74 (!) 142/74 125/70 (!) 150/85  Pulse: 74 78  76  Resp: 20 16  18   Temp:  97.8 F (36.6 C)  97.9 F (36.6 C)  TempSrc:  Oral  Oral  SpO2: 98% 98%  98%  Weight:    82 kg    Intake/Output Summary (Last 24 hours) at 05/09/2019 0903 Last data filed at 05/09/2019 0418 Gross per 24 hour  Intake --  Output 650 ml  Net -650 ml   Last 3 Weights 05/09/2019 05/08/2019 04/22/2019  Weight (lbs) 180 lb 12.4 oz 182 lb 182 lb  Weight (kg) 82 kg 82.555 kg 82.555 kg      Telemetry    SR - Personally Reviewed  ECG    No new - Personally Reviewed  Physical Exam   GEN: No acute distress.   Neck: No JVD Cardiac: RRR, no murmurs, rubs, or gallops.  Respiratory: Clear to auscultation bilaterally. GI: Soft, nontender, non-distended  MS: No edema; No deformity. Neuro:  Nonfocal  Psych: Normal affect   Labs    High Sensitivity Troponin:  No results for input(s): TROPONINIHS in the last 720 hours.     Chemistry Recent Labs  Lab 05/09/19 0402  NA 142  K 3.7  CL 109  CO2 25  GLUCOSE 126*  BUN 8  CREATININE 0.74  CALCIUM 9.4  GFRNONAA >60  GFRAA >60  ANIONGAP 8     Hematology Recent Labs  Lab 05/09/19 0402  WBC 6.1  RBC 3.73*  HGB 11.4*  HCT 35.6*  MCV 95.4  MCH 30.6  MCHC 32.0  RDW 14.8  PLT 312    BNPNo results for input(s): BNP, PROBNP in the last 168 hours.   DDimer No results for input(s): DDIMER in the last 168 hours.   Radiology    CARDIAC CATHETERIZATION  Result Date: 05/08/2019  Prox RCA lesion is 50% stenosed.  Prox LAD lesion is 40% stenosed.  The left ventricular systolic function is normal.  LV end diastolic pressure is normal.  The left ventricular ejection fraction is greater than 65% by visual estimate.  There is no mitral valve regurgitation.  1. Mild non-obstructive disease in the proximal LAD 2. Moderate non-obstructive disease in the proximal RCA 3. Normal LV systolic function Medical management of CAD. Explore other causes of chest pain.  Cardiac Studies   IMPRESSIONS    1. Left ventricular ejection fraction, by visual estimation, is 65 to 70%. The left ventricle has hyperdynamic function. There is mildly increased left ventricular hypertrophy.  2. Elevated left atrial and left ventricular end-diastolic pressures.  3. Left ventricular diastolic parameters are consistent with Grade I diastolic dysfunction (impaired relaxation).  4. The left ventricle has no regional wall motion abnormalities.  5. Global right ventricle has normal systolic function.The right ventricular size is normal. No increase in right ventricular wall thickness.  6. Left atrial size was mildly dilated.  7. Right atrial size was normal.  8. Mild mitral annular calcification.  9. The mitral valve is abnormal. Trivial mitral valve regurgitation. 10. The tricuspid valve is grossly normal. 11. Aortic valve area, by VTI measures 1.16 cm. 12. Aortic valve  mean gradient measures 15.4 mmHg. 13. Aortic valve peak gradient measures 31.6 mmHg. 14. The aortic valve is tricuspid. Aortic valve regurgitation is trivial. Moderate aortic valve stenosis. 15. The pulmonic valve was grossly normal. Pulmonic valve regurgitation is not visualized. 16. The inferior vena cava is normal in size with greater than 50% respiratory variability, suggesting right atrial pressure of 3 mmHg.  cath  Prox RCA lesion is 50% stenosed.  Prox LAD lesion is 40% stenosed.  The left ventricular systolic function is normal.  LV end diastolic pressure is normal.  The left ventricular ejection fraction is greater than 65% by visual estimate.  There is no mitral valve regurgitation.   1. Mild non-obstructive disease in the proximal LAD 2. Moderate non-obstructive disease in the proximal RCA 3. Normal LV systolic function  Medical management of CAD. Explore other causes of chest pain.    Patient Profile     Deborah Hutchinson is a 73 y.o. female with history of DM, HTN, HLD, CAD by coronary CTA in 2019, sleep apnea, mild aortic stenosis and COPD who was admitted to [facility] 05/06/19 with chest pain.   Assessment & Plan   1. Chest pain and SOB -unclear exactly what is causing her chest pain or shortness of breath.  Echocardiogram suggest elevated LV filling pressure, and diastolic dysfunction.  Aortic stenosis has not progressed significantly since May.  Cath shows nonobstructive CAD.  She is already on excellent medical therapy for hypertension.  I suspect that with diastolic dysfunction, elevated LV filling pressures, and likely episodic hypertension, this may be the driver for her shortness of breath and chest pain.  Optimization of her filling pressures is warranted.  - I will leave her on losartan HCTZ and we will add spironolactone for additional diuresis.  This can be titrated as an outpatient.  We could also consider Lasix which we discussed, however she may  benefit more from spironolactone -Basic metabolic panel in 1 week to evaluate creatinine and potassium -First available follow-up with Dr. Geraldo Pitter in cardiology for evaluation of chest pain and med titration.  She is safe to leave the hospital today and she feels well and would like to go home.  For questions or updates, please contact [place] Please consult [email or web address] for contact info under        Signed, Elouise Munroe, MD  05/09/2019, 9:03 AM

## 2019-05-14 DIAGNOSIS — D509 Iron deficiency anemia, unspecified: Secondary | ICD-10-CM | POA: Diagnosis not present

## 2019-05-14 DIAGNOSIS — D473 Essential (hemorrhagic) thrombocythemia: Secondary | ICD-10-CM | POA: Diagnosis not present

## 2019-05-16 ENCOUNTER — Other Ambulatory Visit: Payer: Self-pay

## 2019-05-16 ENCOUNTER — Encounter: Payer: Self-pay | Admitting: Cardiology

## 2019-05-16 ENCOUNTER — Ambulatory Visit (INDEPENDENT_AMBULATORY_CARE_PROVIDER_SITE_OTHER): Payer: Medicare HMO | Admitting: Cardiology

## 2019-05-16 VITALS — BP 124/80 | HR 89 | Ht 65.0 in | Wt 179.0 lb

## 2019-05-16 DIAGNOSIS — E088 Diabetes mellitus due to underlying condition with unspecified complications: Secondary | ICD-10-CM

## 2019-05-16 DIAGNOSIS — Z6827 Body mass index (BMI) 27.0-27.9, adult: Secondary | ICD-10-CM | POA: Diagnosis not present

## 2019-05-16 DIAGNOSIS — M159 Polyosteoarthritis, unspecified: Secondary | ICD-10-CM | POA: Diagnosis not present

## 2019-05-16 DIAGNOSIS — I10 Essential (primary) hypertension: Secondary | ICD-10-CM

## 2019-05-16 DIAGNOSIS — I350 Nonrheumatic aortic (valve) stenosis: Secondary | ICD-10-CM | POA: Insufficient documentation

## 2019-05-16 DIAGNOSIS — E782 Mixed hyperlipidemia: Secondary | ICD-10-CM

## 2019-05-16 DIAGNOSIS — M5440 Lumbago with sciatica, unspecified side: Secondary | ICD-10-CM | POA: Diagnosis not present

## 2019-05-16 DIAGNOSIS — I2510 Atherosclerotic heart disease of native coronary artery without angina pectoris: Secondary | ICD-10-CM | POA: Diagnosis not present

## 2019-05-16 DIAGNOSIS — Z09 Encounter for follow-up examination after completed treatment for conditions other than malignant neoplasm: Secondary | ICD-10-CM | POA: Diagnosis not present

## 2019-05-16 DIAGNOSIS — K219 Gastro-esophageal reflux disease without esophagitis: Secondary | ICD-10-CM | POA: Diagnosis not present

## 2019-05-16 DIAGNOSIS — R197 Diarrhea, unspecified: Secondary | ICD-10-CM | POA: Diagnosis not present

## 2019-05-16 DIAGNOSIS — E663 Overweight: Secondary | ICD-10-CM | POA: Diagnosis not present

## 2019-05-16 HISTORY — DX: Nonrheumatic aortic (valve) stenosis: I35.0

## 2019-05-16 NOTE — Patient Instructions (Signed)
Medication Instructions:  Your physician recommends that you continue on your current medications as directed. Please refer to the Current Medication list given to you today.  *If you need a refill on your cardiac medications before your next appointment, please call your pharmacy*  Lab Work: NONE If you have labs (blood work) drawn today and your tests are completely normal, you will receive your results only by: Marland Kitchen MyChart Message (if you have MyChart) OR . A paper copy in the mail If you have any lab test that is abnormal or we need to change your treatment, we will call you to review the results.  Testing/Procedures: NONE  Follow-Up: At [facility], you and your health needs are our priority.  As part of our continuing mission to provide you with exceptional heart care, we have created designated Provider Care Teams.  These Care Teams include your primary Cardiologist (physician) and Advanced Practice Providers (APPs -  Physician Assistants and Nurse Practitioners) who all work together to provide you with the care you need, when you need it.  Your next appointment:   3 month(s)  The format for your next appointment:   In Person  Provider:   Jyl Heinz, MD

## 2019-05-16 NOTE — Progress Notes (Signed)
Cardiology Office Note:    Date:  05/16/2019   ID:  Deborah Hutchinson, DOB 1945/11/28, MRN [id number]  PCP:  [name], MD  Cardiologist:  [name], MD   Referring MD: [name], MD    ASSESSMENT:    1. Essential hypertension   2. Moderate aortic stenosis   3. Coronary artery disease involving native coronary artery of native heart without angina pectoris   4. Diabetes mellitus due to underlying condition with unspecified complications ([place])   5. Mixed dyslipidemia    PLAN:    In order of problems listed above:  1. Coronary artery disease: Secondary prevention stressed with the patient.  Importance of compliance with diet and medication stressed and she vocalized understanding.  I discussed coronary angiography report with her at extensive length. 2. Moderate aortic stenosis: She denies any symptoms of dizziness or dyspnea on exertion.  I discussed with her and educated her about aortic stenosis symptoms 3. Essential hypertension: Blood pressure is stable 4. Mixed dyslipidemia and diabetes mellitus: Diet was discussed.  I will check her lipids when she comes back in 3 months for follow-up.  We will make a fasting appointment. 5. Patient had multiple questions which were answered to her satisfaction in her hospital records were reviewed by me extensively.  Total time for this was 30 minutes.   Medication Adjustments/Labs and Tests Ordered: Current medicines are reviewed at length with the patient today.  Concerns regarding medicines are outlined above.  No orders of the defined types were placed in this encounter.  No orders of the defined types were placed in this encounter.    Chief Complaint  Patient presents with  . Follow-up     History of Present Illness:    Deborah Hutchinson is a 74 y.o. female.  Patient has past medical history of essential hypertension diabetes mellitus dyslipidemia and aortic stenosis.  She went to [facility] with chest pain.  She  underwent angiography which revealed nonobstructive disease.  The details are mentioned below.  Echocardiogram revealed moderate aortic stenosis.  Subsequently she is done fine.  No chest pain orthopnea or PND.  At the time of my evaluation, the patient is alert awake oriented and in no distress.  She has issues with headache which she is being taken care of her by other providers.  Past Medical History:  Diagnosis Date  . Angina    normal stress test  . Anxiety   . Arthritis   . Asthma   . Bronchitis   . 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 Diabetes mellitus   . Diabetes mellitus due to underlying condition with unspecified complications ([place]) 0000000  . Dyslipidemia 06/10/2015  . Essential hypertension 06/10/2015  . GERD (gastroesophageal reflux disease)   . H/O hiatal hernia   . Heart murmur   . Hyperlipemia   . Hypertension   . Nonspecific (abnormal) findings on radiological and other examination of gastrointestinal tract 05/19/2011  . Pain and swelling of toe of right foot 07/15/2016  . Palpitations 06/02/2016  . Shortness of breath    on  excertion  . Sleep apnea     Past Surgical History:  Procedure Laterality Date  . ABDOMINAL HYSTERECTOMY    . ABDOMINAL HYSTERECTOMY    . BREAST SURGERY     begin mass,left  breast  . COLONOSCOPY  08/17/2015   Colonic polyp status post polypectomy. Mild pancolonic diverticulosis. Highly redundant colon.   Marland Kitchen  DILATION AND CURETTAGE OF UTERUS     one  . ESOPHAGOGASTRODUODENOSCOPY  08/17/2015   Moderate hiatal hernia. Otherwise noraml EGD.   . EUS  05/19/2011   Procedure: UPPER ENDOSCOPIC ULTRASOUND (EUS) LINEAR;  Surgeon: Owens Loffler, MD;  Location: [facility] ENDOSCOPY;  Service: Endoscopy;  Laterality: N/A;  radial linear   . HYSTEROTOMY    . KNEE SURGERY    . LEFT HEART CATH AND CORONARY ANGIOGRAPHY    . LEFT HEART CATH AND CORONARY ANGIOGRAPHY N/A 05/08/2019   Procedure:  LEFT HEART CATH AND CORONARY ANGIOGRAPHY;  Surgeon: Burnell Blanks, MD;  Location: [facility];  Service: Cardiovascular;  Laterality: N/A;  . right thumb     tendon repaire  . SHOULDER SURGERY     rotator cuff repair  . WRIST SURGERY     rigth    Current Medications: Current Meds  Medication Sig  . albuterol (PROVENTIL HFA;VENTOLIN HFA) 108 (90 Base) MCG/ACT inhaler Inhale 2 puffs into the lungs every 6 (six) hours as needed for wheezing or shortness of breath.  Marland Kitchen amLODipine (NORVASC) 10 MG tablet Take 10 mg by mouth daily.  . BD PEN NEEDLE NANO U/F 32G X 4 MM MISC   . budesonide-formoterol (SYMBICORT) 160-4.5 MCG/ACT inhaler Inhale 2 puffs into the lungs 2 (two) times daily.  Marland Kitchen buPROPion (WELLBUTRIN XL) 300 MG 24 hr tablet Take 300 mg by mouth daily.   . Cholecalciferol (VITAMIN D PO) Take 5,000 Units by mouth as directed. Every 2 weeks  . clopidogrel (PLAVIX) 75 MG tablet Take 1 tablet (75 mg total) by mouth daily.  Marland Kitchen doxycycline (VIBRAMYCIN) 100 MG capsule 1 capsule 2 (two) times daily.  Marland Kitchen escitalopram (LEXAPRO) 10 MG tablet Take 10 mg by mouth daily.  . famotidine (PEPCID) 40 MG tablet Take 1 tablet (40 mg total) by mouth 2 (two) times daily.  . fluticasone (VERAMYST) 27.5 MCG/SPRAY nasal spray Place 2 sprays into the nose daily.  Marland Kitchen gabapentin (NEURONTIN) 300 MG capsule Take 300 mg by mouth 3 (three) times daily as needed.   . hydrALAZINE (APRESOLINE) 50 MG tablet Take 1 tablet (50 mg total) by mouth 3 (three) times daily.  . hydrochlorothiazide (HYDRODIURIL) 25 MG tablet Take 1 tablet (25 mg total) by mouth daily.  . isosorbide mononitrate (IMDUR) 120 MG 24 hr tablet Take 1 tablet (120 mg total) by mouth daily.  Marland Kitchen ketoconazole (NIZORAL) 2 % shampoo Wash 2 times a week  . Lancets (ACCU-CHEK MULTICLIX) lancets   . linaclotide (LINZESS) 72 MCG capsule Take 72 mcg by mouth as needed.  Marland Kitchen losartan (COZAAR) 100 MG tablet Take 1 tablet (100 mg total) by mouth daily.  .  metFORMIN (GLUCOPHAGE) 500 MG tablet Take 500 mg by mouth 2 (two) times daily with a meal.   . montelukast (SINGULAIR) 10 MG tablet Take 10 mg by mouth at bedtime.   . Multiple Vitamin (MULTIVITAMIN) capsule Take 1 capsule by mouth daily.    . nitroGLYCERIN (NITROSTAT) 0.4 MG SL tablet DISSOLVE ONE TABLET UNDER THE TONGUE EVERY 5 MINUTES AS NEEDED FOR CHEST PAIN.  DO NOT EXCEED A TOTAL OF 3 DOSES IN 15 MINUTES  . oxybutynin (DITROPAN-XL) 10 MG 24 hr tablet Take 10 mg by mouth daily.  . pantoprazole (PROTONIX) 40 MG tablet Take 1 tablet (40 mg total) by mouth daily.  . Potassium Chloride CR (MICRO-K) 8 MEQ CPCR capsule CR TAKE 1 CAPSULE BY MOUTH ONCE DAILY FOR LOW POTASSIUM  . simvastatin (ZOCOR) 20 MG  tablet Take 20 mg by mouth at bedtime.  Marland Kitchen spironolactone (ALDACTONE) 25 MG tablet Take 1 tablet (25 mg total) by mouth daily.  . traMADol (ULTRAM) 50 MG tablet Take 50 mg by mouth 2 (two) times daily as needed for pain.  Marland Kitchen VICTOZA 18 MG/3ML SOPN 12 mg daily.      Allergies:   Penicillins   Social History   Socioeconomic History  . Marital status: Single    Spouse name: Not on file  . Number of children: Not on file  . Years of education: Not on file  . Highest education level: Not on file  Occupational History  . Not on file  Tobacco Use  . Smoking status: Former Smoker    Packs/day: 1.00    Quit date: 05/17/2011    Years since quitting: 8.0  . Smokeless tobacco: Never Used  Substance and Sexual Activity  . Alcohol use: No  . Drug use: No  . Sexual activity: Never  Other Topics Concern  . Not on file  Social History Narrative  . Not on file   Social Determinants of Health   Financial Resource Strain:   . Difficulty of Paying Living Expenses: Not on file  Food Insecurity:   . Worried About Charity fundraiser in the Last Year: Not on file  . Ran Out of Food in the Last Year: Not on file  Transportation Needs:   . Lack of Transportation (Medical): Not on file  . Lack of  Transportation (Non-Medical): Not on file  Physical Activity:   . Days of Exercise per Week: Not on file  . Minutes of Exercise per Session: Not on file  Stress:   . Feeling of Stress : Not on file  Social Connections:   . Frequency of Communication with Friends and Family: Not on file  . Frequency of Social Gatherings with Friends and Family: Not on file  . Attends Religious Services: Not on file  . Active Member of Clubs or Organizations: Not on file  . Attends Archivist Meetings: Not on file  . Marital Status: Not on file     Family History: The patient's family history includes Diabetes in her sister; Hypertension in her daughter. There is no history of Colon cancer.  ROS:   Please see the history of present illness.    All other systems reviewed and are negative.  EKGs/Labs/Other Studies Reviewed:    The following studies were reviewed today: Burnell Blanks, MD (Primary)    Procedures  LEFT HEART CATH AND CORONARY ANGIOGRAPHY  Conclusion    Prox RCA lesion is 50% stenosed.  Prox LAD lesion is 40% stenosed.  The left ventricular systolic function is normal.  LV end diastolic pressure is normal.  The left ventricular ejection fraction is greater than 65% by visual estimate.  There is no mitral valve regurgitation.   1. Mild non-obstructive disease in the proximal LAD 2. Moderate non-obstructive disease in the proximal RCA 3. Normal LV systolic function  Medical management of CAD. Explore other causes of chest pain.    IMPRESSIONS    1. Left ventricular ejection fraction, by visual estimation, is 65 to 70%. The left ventricle has hyperdynamic function. There is mildly increased left ventricular hypertrophy.  2. Elevated left atrial and left ventricular end-diastolic pressures.  3. Left ventricular diastolic parameters are consistent with Grade I diastolic dysfunction (impaired relaxation).  4. The left ventricle has no regional wall  motion abnormalities.  5. Global right  ventricle has normal systolic function.The right ventricular size is normal. No increase in right ventricular wall thickness.  6. Left atrial size was mildly dilated.  7. Right atrial size was normal.  8. Mild mitral annular calcification.  9. The mitral valve is abnormal. Trivial mitral valve regurgitation. 10. The tricuspid valve is grossly normal. 11. Aortic valve area, by VTI measures 1.16 cm. 12. Aortic valve mean gradient measures 15.4 mmHg. 13. Aortic valve peak gradient measures 31.6 mmHg. 14. The aortic valve is tricuspid. Aortic valve regurgitation is trivial. Moderate aortic valve stenosis. 15. The pulmonic valve was grossly normal. Pulmonic valve regurgitation is not visualized. 16. The inferior vena cava is normal in size with greater than 50% respiratory variability, suggesting right atrial pressure of 3 mmHg.    Recent Labs: 09/24/2018: ALT 20; Pro B Natriuretic peptide (BNP) 36.0; TSH 1.56 05/09/2019: BUN 8; Creatinine, Ser 0.74; Hemoglobin 11.4; Platelets 312; Potassium 3.7; Sodium 142  Recent Lipid Panel    Component Value Date/Time   CHOL 169 03/06/2018 1510   TRIG 101 03/06/2018 1510   HDL 88 03/06/2018 1510   CHOLHDL 1.9 03/06/2018 1510   LDLCALC 61 03/06/2018 1510    Physical Exam:    VS:  BP 124/80   Pulse 89   Ht 5\' 5"  (1.651 m)   Wt 179 lb (81.2 kg)   SpO2 98%   BMI 29.79 kg/m     Wt Readings from Last 3 Encounters:  05/16/19 179 lb (81.2 kg)  05/09/19 180 lb 12.4 oz (82 kg)  04/22/19 182 lb (82.6 kg)     GEN: Patient is in no acute distress HEENT: Normal NECK: No JVD; No carotid bruits LYMPHATICS: No lymphadenopathy CARDIAC: Hear sounds regular, 2/6 systolic murmur at the apex. RESPIRATORY:  Clear to auscultation without rales, wheezing or rhonchi  ABDOMEN: Soft, non-tender, non-distended MUSCULOSKELETAL:  No edema; No deformity  SKIN: Warm and dry NEUROLOGIC:  Alert and oriented x 3 PSYCHIATRIC:   Normal affect   Signed, [name], MD  05/16/2019 10:00 AM    [facility]

## 2019-05-21 DIAGNOSIS — M4316 Spondylolisthesis, lumbar region: Secondary | ICD-10-CM | POA: Diagnosis not present

## 2019-05-22 ENCOUNTER — Other Ambulatory Visit: Payer: Self-pay | Admitting: Gastroenterology

## 2019-05-22 ENCOUNTER — Other Ambulatory Visit: Payer: Self-pay

## 2019-05-22 MED ORDER — FAMOTIDINE 40 MG PO TABS: 40.0000 mg | ORAL_TABLET | Freq: Every day | ORAL | 3 refills | Status: DC

## 2019-05-28 DIAGNOSIS — E663 Overweight: Secondary | ICD-10-CM | POA: Diagnosis not present

## 2019-05-28 DIAGNOSIS — R32 Unspecified urinary incontinence: Secondary | ICD-10-CM | POA: Diagnosis not present

## 2019-05-28 DIAGNOSIS — M159 Polyosteoarthritis, unspecified: Secondary | ICD-10-CM | POA: Diagnosis not present

## 2019-05-28 DIAGNOSIS — Z6827 Body mass index (BMI) 27.0-27.9, adult: Secondary | ICD-10-CM | POA: Diagnosis not present

## 2019-05-28 DIAGNOSIS — R197 Diarrhea, unspecified: Secondary | ICD-10-CM | POA: Diagnosis not present

## 2019-05-28 DIAGNOSIS — E1140 Type 2 diabetes mellitus with diabetic neuropathy, unspecified: Secondary | ICD-10-CM | POA: Diagnosis not present

## 2019-06-04 DIAGNOSIS — M545 Low back pain: Secondary | ICD-10-CM | POA: Diagnosis not present

## 2019-06-04 DIAGNOSIS — M4316 Spondylolisthesis, lumbar region: Secondary | ICD-10-CM | POA: Diagnosis not present

## 2019-06-06 DIAGNOSIS — M545 Low back pain: Secondary | ICD-10-CM | POA: Diagnosis not present

## 2019-06-06 DIAGNOSIS — M4316 Spondylolisthesis, lumbar region: Secondary | ICD-10-CM | POA: Diagnosis not present

## 2019-06-07 ENCOUNTER — Ambulatory Visit (INDEPENDENT_AMBULATORY_CARE_PROVIDER_SITE_OTHER): Payer: Medicare HMO | Admitting: Sports Medicine

## 2019-06-07 ENCOUNTER — Other Ambulatory Visit: Payer: Self-pay | Admitting: Sports Medicine

## 2019-06-07 ENCOUNTER — Encounter: Payer: Self-pay | Admitting: Sports Medicine

## 2019-06-07 ENCOUNTER — Other Ambulatory Visit: Payer: Self-pay

## 2019-06-07 ENCOUNTER — Ambulatory Visit (INDEPENDENT_AMBULATORY_CARE_PROVIDER_SITE_OTHER): Payer: Medicare HMO

## 2019-06-07 DIAGNOSIS — M79671 Pain in right foot: Secondary | ICD-10-CM

## 2019-06-07 DIAGNOSIS — M2141 Flat foot [pes planus] (acquired), right foot: Secondary | ICD-10-CM

## 2019-06-07 DIAGNOSIS — M2142 Flat foot [pes planus] (acquired), left foot: Secondary | ICD-10-CM

## 2019-06-07 DIAGNOSIS — M779 Enthesopathy, unspecified: Secondary | ICD-10-CM | POA: Diagnosis not present

## 2019-06-07 DIAGNOSIS — M778 Other enthesopathies, not elsewhere classified: Secondary | ICD-10-CM

## 2019-06-07 DIAGNOSIS — M76821 Posterior tibial tendinitis, right leg: Secondary | ICD-10-CM | POA: Diagnosis not present

## 2019-06-07 DIAGNOSIS — E119 Type 2 diabetes mellitus without complications: Secondary | ICD-10-CM | POA: Diagnosis not present

## 2019-06-07 NOTE — Patient Instructions (Signed)
Tylenol arthritis OTC Topical pain patch/cream  Ice daily for 15 mins for atleast 1 week until pain is better

## 2019-06-07 NOTE — Progress Notes (Signed)
Subjective: Deborah Hutchinson is a 74 y.o. female patient who presents to office for evaluation of Right foot pain. Patient complains of progressive pain especially over the last 4-5 days in the right foot at the medial side. Ranks pain 5/10 and is now interferring with daily activities and is tender to touch. Patient has tried nothing with no relief in symptoms. Patient denies any other pedal complaints. Denies injury/trip/fall/sprain/any causative factors.   Patient is diabetic FBS 88 yesterday A1c 6.5 PCP, Dr. Rica Records last visit 2 weeks ago.  Review of Systems  All other systems reviewed and are negative.   Patient Active Problem List   Diagnosis Date Noted  . Moderate aortic stenosis 05/16/2019  . Chest pain 05/08/2019  . Chest pain of uncertain etiology   . CAD (coronary artery disease) 02/13/2019  . Headache 09/24/2018  . Asthma 10/05/2017  . COPD (chronic obstructive pulmonary disease) ([place]) 02/09/2017  . OSA (obstructive sleep apnea) 02/09/2017  . Diabetes mellitus due to underlying condition with unspecified complications ([place]) 99991111  . Mixed dyslipidemia 06/10/2015  . Essential hypertension 06/10/2015    Current Outpatient Medications on File Prior to Visit  Medication Sig Dispense Refill  . albuterol (PROVENTIL HFA;VENTOLIN HFA) 108 (90 Base) MCG/ACT inhaler Inhale 2 puffs into the lungs every 6 (six) hours as needed for wheezing or shortness of breath. 3 Inhaler 3  . amLODipine (NORVASC) 10 MG tablet Take 10 mg by mouth daily.    . BD PEN NEEDLE NANO U/F 32G X 4 MM MISC     . budesonide-formoterol (SYMBICORT) 160-4.5 MCG/ACT inhaler Inhale 2 puffs into the lungs 2 (two) times daily. 3 Inhaler 3  . buPROPion (WELLBUTRIN XL) 300 MG 24 hr tablet Take 300 mg by mouth daily.     . Cholecalciferol (VITAMIN D PO) Take 5,000 Units by mouth as directed. Every 2 weeks    . clopidogrel (PLAVIX) 75 MG tablet Take 1 tablet (75 mg total) by mouth daily. 30 tablet 11  .  dicyclomine (BENTYL) 20 MG tablet Take 20 mg by mouth every 6 (six) hours as needed.     . doxycycline (VIBRAMYCIN) 100 MG capsule 1 capsule 2 (two) times daily.    Marland Kitchen escitalopram (LEXAPRO) 10 MG tablet Take 10 mg by mouth daily.    . famotidine (PEPCID) 40 MG tablet Take 1 tablet (40 mg total) by mouth at bedtime. 180 tablet 3  . fluticasone (VERAMYST) 27.5 MCG/SPRAY nasal spray Place 2 sprays into the nose daily.    Marland Kitchen gabapentin (NEURONTIN) 300 MG capsule Take 300 mg by mouth 3 (three) times daily as needed.     . hydrALAZINE (APRESOLINE) 50 MG tablet Take 1 tablet (50 mg total) by mouth 3 (three) times daily. 180 tablet 2  . hydrochlorothiazide (HYDRODIURIL) 25 MG tablet Take 1 tablet (25 mg total) by mouth daily. 60 tablet 2  . isosorbide mononitrate (IMDUR) 120 MG 24 hr tablet Take 1 tablet (120 mg total) by mouth daily. 60 tablet 2  . ketoconazole (NIZORAL) 2 % shampoo Wash 2 times a week    . Lancets (ACCU-CHEK MULTICLIX) lancets     . linaclotide (LINZESS) 72 MCG capsule Take 72 mcg by mouth as needed.    Marland Kitchen losartan (COZAAR) 100 MG tablet Take 1 tablet (100 mg total) by mouth daily. 60 tablet 2  . metFORMIN (GLUCOPHAGE) 500 MG tablet Take 500 mg by mouth 2 (two) times daily with a meal.     . montelukast (SINGULAIR) 10 MG  tablet Take 10 mg by mouth at bedtime.     . Multiple Vitamin (MULTIVITAMIN) capsule Take 1 capsule by mouth daily.      . nitroGLYCERIN (NITROSTAT) 0.4 MG SL tablet DISSOLVE ONE TABLET UNDER THE TONGUE EVERY 5 MINUTES AS NEEDED FOR CHEST PAIN.  DO NOT EXCEED A TOTAL OF 3 DOSES IN 15 MINUTES 25 tablet 5  . oxybutynin (DITROPAN-XL) 10 MG 24 hr tablet Take 10 mg by mouth daily.    . pantoprazole (PROTONIX) 40 MG tablet Take 1 tablet (40 mg total) by mouth daily. 60 tablet 2  . Potassium Chloride CR (MICRO-K) 8 MEQ CPCR capsule CR TAKE 1 CAPSULE BY MOUTH ONCE DAILY FOR LOW POTASSIUM    . simvastatin (ZOCOR) 20 MG tablet Take 20 mg by mouth at bedtime.    Marland Kitchen spironolactone  (ALDACTONE) 25 MG tablet Take 1 tablet (25 mg total) by mouth daily. 60 tablet 2  . traMADol (ULTRAM) 50 MG tablet Take 50 mg by mouth 2 (two) times daily as needed for pain.    Marland Kitchen VICTOZA 18 MG/3ML SOPN 12 mg daily.      No current facility-administered medications on file prior to visit.    Allergies  Allergen Reactions  . Penicillins Rash    Reaction was 30years ago  Has never taken again    Objective:  General: Alert and oriented x3 in no acute distress  Dermatology: No open lesions bilateral lower extremities, no webspace macerations, no ecchymosis bilateral, all nails x 10 are well manicured.  Vascular: Dorsalis Pedis and Posterior Tibial pedal pulses palpable, Capillary Fill Time 3 seconds,(+) pedal hair growth bilateral, no edema bilateral lower extremities, Temperature gradient within normal limits.  Neurology: Johney Maine sensation intact via light touch bilateral.  Musculoskeletal: Mild tenderness with palpation at PT tendon insertion at level of navicular on right foot,No pain with calf compression bilateral. There is arch collapse with weightbearing consistent with pes planus and limited midtarsal ROM on right. Strength within normal limits in all groups bilateral.   Gait: Mildly Antalgic gait  Xrays  Right Foot   Impression:Midfoot arthritis and breach supportive of pes planus. Mild soft tissue swelling. No fracture, No dislocation, no other acute findings.  Assessment and Plan: Problem List Items Addressed This Visit    None    Visit Diagnoses    Tendonitis    -  Primary   Posterior tibial tendon dysfunction (PTTD) of right lower extremity       Capsulitis of foot, right       Pes planus of both feet       Right foot pain       Diabetes mellitus without complication (HCC)           -Complete examination performed -Xrays reviewed -Discussed treatment options for tendonitis -Patient declined oral medication or steroid shot at this time -Recommend to try Tylenol  arthritis and OTC voltaren or pain patch  -Dispensed ankle gaunlet to strap from lateral to medial to take stress off of the PT tendon on the right -Recommend rest, ice, elevation and good supportive shoes daily, advised patient to refrain from use of flip flops -Patient to return to office if no better after 2 weeks or sooner if condition worsens.  Landis Martins, DPM

## 2019-06-10 ENCOUNTER — Other Ambulatory Visit: Payer: Self-pay

## 2019-06-10 ENCOUNTER — Encounter: Payer: Self-pay | Admitting: Gastroenterology

## 2019-06-10 ENCOUNTER — Telehealth (INDEPENDENT_AMBULATORY_CARE_PROVIDER_SITE_OTHER): Payer: Medicare HMO | Admitting: Gastroenterology

## 2019-06-10 VITALS — Ht 65.5 in | Wt 181.0 lb

## 2019-06-10 DIAGNOSIS — R197 Diarrhea, unspecified: Secondary | ICD-10-CM

## 2019-06-10 DIAGNOSIS — K219 Gastro-esophageal reflux disease without esophagitis: Secondary | ICD-10-CM

## 2019-06-10 NOTE — Addendum Note (Signed)
Addended by: Karena Addison on: 06/10/2019 04:16 PM   Modules accepted: Orders

## 2019-06-10 NOTE — Progress Notes (Signed)
IMPRESSION and PLAN:    #1.  GERD with large HH with eso dysphagia. S/P EGD 12/2018 with dil (50Fr), large HH, hyperplastic gastric polyps. UGI 04/2019- HH, GERD, no stricture. #2.  Diarrhea.  New onset. H/O being on doxycycline in the past.  R/O C. Difficile. H/O constipation. Neg CT AP 02/2018.  Has been on Trulicity. #3.  H/O TAs (s/p colon 12/2018. Next due 12/2021).  Plan: - Stool studies for GI Pathogen (includes C. Diff).  Wants to get it done in [place] or [place]. - Continue omeprazole 20 mg p.o. BID. Famotidine 40mg  po qhs for now.  If still with diarrhea, may stop omeprazole and switch to Protonix. - Raise HOB (bricks) 30 to 45 degrees. - Stop Linzess. - FU in12 weeks in person, earlier in case of any problems. If still with problems,       HPI:    Chief Complaint:   Deborah Hutchinson is a 74 y.o. female  For follow-up visit Feels much better Has not had any " throwing up" for 1 week. Mostly at night-sounds more like regurgitation.  No weight loss.  Upper GI series 04/2019 did confirm hiatal hernia, reflux.  No strictures.  No weight loss.  In fact she has gained weight   Diarrhea 2-3 weeks.  With occasional nocturnal symptoms, 1-2 times per day.  Used to be constipated.  No melena or hematochezia.  She has stopped taking Linzess but continues to have diarrhea.  Has not been taking doxycycline.  No fever or chills.  Denies having any melena or hematochezia  Past GI procedures: -EGD 12/14/2018: Large HH, hyperplastic gastric polyps, eso stricture s/p dilatation 50 Fr. EGD 08/17/2015 mod HH, neg SB Bx for celiac. -UGI series 04/2019: HH, GERD, no strictures.  Ba tab passed without any problems. -Colonoscopy 12/14/2018-colonic polyps s/p polypectomy, mild pancolonic div. Bx- TA. -CT AP with contrast 02/09/2018: Neg, Stable left adrenal adenoma, small right inguinal hernia. Past Medical History:  Diagnosis Date  . Angina    normal stress test  . Anxiety   .  Arthritis   . Asthma   . Bronchitis   . 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 Diabetes mellitus   . Diabetes mellitus due to underlying condition with unspecified complications ([place]) 0000000  . Dyslipidemia 06/10/2015  . Essential hypertension 06/10/2015  . GERD (gastroesophageal reflux disease)   . H/O hiatal hernia   . Heart murmur   . Hyperlipemia   . Hypertension   . Nonspecific (abnormal) findings on radiological and other examination of gastrointestinal tract 05/19/2011  . Pain and swelling of toe of right foot 07/15/2016  . Palpitations 06/02/2016  . Shortness of breath    on  excertion  . Sleep apnea     Current Outpatient Medications  Medication Sig Dispense Refill  . albuterol (PROVENTIL HFA;VENTOLIN HFA) 108 (90 Base) MCG/ACT inhaler Inhale 2 puffs into the lungs every 6 (six) hours as needed for wheezing or shortness of breath. 3 Inhaler 3  . amLODipine (NORVASC) 10 MG tablet Take 10 mg by mouth daily.    . budesonide-formoterol (SYMBICORT) 160-4.5 MCG/ACT inhaler Inhale 2 puffs into the lungs 2 (two) times daily. 3 Inhaler 3  . buPROPion (WELLBUTRIN XL) 300 MG 24 hr tablet Take 300 mg by mouth daily.     . Cholecalciferol (VITAMIN D PO) Take 5,000 Units by mouth as directed. Every 2 weeks    .  clopidogrel (PLAVIX) 75 MG tablet Take 1 tablet (75 mg total) by mouth daily. 30 tablet 11  . dicyclomine (BENTYL) 20 MG tablet Take 20 mg by mouth every 6 (six) hours as needed.     .      . escitalopram (LEXAPRO) 10 MG tablet Take 10 mg by mouth daily.    . famotidine (PEPCID) 40 MG tablet Take 1 tablet (40 mg total) by mouth at bedtime. 180 tablet 3  . fluticasone (VERAMYST) 27.5 MCG/SPRAY nasal spray Place 2 sprays into the nose daily.    Marland Kitchen gabapentin (NEURONTIN) 300 MG capsule Take 300 mg by mouth 3 (three) times daily as needed.     . hydrALAZINE (APRESOLINE) 50 MG tablet Take 1 tablet (50 mg total) by mouth 3  (three) times daily. 180 tablet 2  . hydrochlorothiazide (HYDRODIURIL) 25 MG tablet Take 1 tablet (25 mg total) by mouth daily. 60 tablet 2  . Lancets (ACCU-CHEK MULTICLIX) lancets     . linaclotide (LINZESS) 72 MCG capsule Take 72 mcg by mouth as needed.    Marland Kitchen losartan (COZAAR) 100 MG tablet Take 1 tablet (100 mg total) by mouth daily. 60 tablet 2  . metFORMIN (GLUCOPHAGE) 500 MG tablet Take 500 mg by mouth 2 (two) times daily with a meal.     . montelukast (SINGULAIR) 10 MG tablet Take 10 mg by mouth at bedtime.     . Multiple Vitamin (MULTIVITAMIN) capsule Take 1 capsule by mouth daily.      Marland Kitchen oxybutynin (DITROPAN-XL) 10 MG 24 hr tablet Take 10 mg by mouth daily.    . pantoprazole (PROTONIX) 40 MG tablet Take 1 tablet (40 mg total) by mouth daily. 60 tablet 2  . Potassium Chloride CR (MICRO-K) 8 MEQ CPCR capsule CR TAKE 1 CAPSULE BY MOUTH ONCE DAILY FOR LOW POTASSIUM    . simvastatin (ZOCOR) 20 MG tablet Take 20 mg by mouth at bedtime.    Marland Kitchen spironolactone (ALDACTONE) 25 MG tablet Take 1 tablet (25 mg total) by mouth daily. 60 tablet 2  . traMADol (ULTRAM) 50 MG tablet Take 50 mg by mouth 2 (two) times daily as needed for pain.    Marland Kitchen VICTOZA 18 MG/3ML SOPN 12 mg daily.     . BD PEN NEEDLE NANO U/F 32G X 4 MM MISC     . isosorbide mononitrate (IMDUR) 120 MG 24 hr tablet Take 1 tablet (120 mg total) by mouth daily. (Patient not taking: Reported on 06/10/2019) 60 tablet 2  . nitroGLYCERIN (NITROSTAT) 0.4 MG SL tablet DISSOLVE ONE TABLET UNDER THE TONGUE EVERY 5 MINUTES AS NEEDED FOR CHEST PAIN.  DO NOT EXCEED A TOTAL OF 3 DOSES IN 15 MINUTES (Patient not taking: Reported on 06/10/2019) 25 tablet 5   No current facility-administered medications for this visit.    Past Surgical History:  Procedure Laterality Date  . ABDOMINAL HYSTERECTOMY    . ABDOMINAL HYSTERECTOMY    . BREAST SURGERY     begin mass,left  breast  . COLONOSCOPY  08/17/2015   Colonic polyp status post polypectomy. Mild pancolonic  diverticulosis. Highly redundant colon.   Marland Kitchen DILATION AND CURETTAGE OF UTERUS     one  . ESOPHAGOGASTRODUODENOSCOPY  08/17/2015   Moderate hiatal hernia. Otherwise noraml EGD.   . EUS  05/19/2011   Procedure: UPPER ENDOSCOPIC ULTRASOUND (EUS) LINEAR;  Surgeon: Owens Loffler, MD;  Location: [facility];  Service: Endoscopy;  Laterality: N/A;  radial linear   . HYSTEROTOMY    .  KNEE SURGERY    . LEFT HEART CATH AND CORONARY ANGIOGRAPHY    . LEFT HEART CATH AND CORONARY ANGIOGRAPHY N/A 05/08/2019   Procedure: LEFT HEART CATH AND CORONARY ANGIOGRAPHY;  Surgeon: Burnell Blanks, MD;  Location: [place] [facility];  Service: Cardiovascular;  Laterality: N/A;  . right thumb     tendon repaire  . SHOULDER SURGERY     rotator cuff repair  . WRIST SURGERY     rigth    Family History  Problem Relation Age of Onset  . Diabetes Sister   . Hypertension Daughter   . Colon cancer Neg Hx     Social History   Tobacco Use  . Smoking status: Former Smoker    Packs/day: 1.00    Quit date: 05/17/2011    Years since quitting: 8.0  . Smokeless tobacco: Never Used  Substance Use Topics  . Alcohol use: No  . Drug use: No    Allergies  Allergen Reactions  . Penicillins Rash    Reaction was 30years ago  Has never taken again     Review of Systems: All systems reviewed and negative except where noted in HPI.    Physical Exam:     Ht 5' 5.5" (1.664 m)   Wt 181 lb (82.1 kg)   BMI 29.66 kg/m  Not examined since it was a televisit.  I connected with  [place] on 06/10/19 by a phone visit (video visit failed) and verified that I am speaking with the correct person using two identifiers.   I discussed the limitations of evaluation and management by telemedicine. The patient expressed understanding and agreed to proceed.  Time spent: Call, coordination of care, review of records: 25 min      Kama Cammarano,MD 06/10/2019, 3:36 PM   CC [name], MD

## 2019-06-10 NOTE — Patient Instructions (Addendum)
If you are age 76 or older, your body mass index should be between 23-30. Your Body mass index is 29.66 kg/m. If this is out of the aforementioned range listed, please consider follow up with your Primary Care Provider.  If you are age 57 or younger, your body mass index should be between 19-25. Your Body mass index is 29.66 kg/m. If this is out of the aformentioned range listed, please consider follow up with your Primary Care Provider.   Please have your stool testing done at [facility].   Continue Omeprazole and Pepcid.   Raise Head of bed 30 to 45 degrees.   Follow up in 12 weeks.   Thank you,  Dr. Jackquline Denmark

## 2019-06-11 DIAGNOSIS — M4316 Spondylolisthesis, lumbar region: Secondary | ICD-10-CM | POA: Diagnosis not present

## 2019-06-11 DIAGNOSIS — M545 Low back pain: Secondary | ICD-10-CM | POA: Diagnosis not present

## 2019-06-12 ENCOUNTER — Other Ambulatory Visit: Payer: Self-pay | Admitting: Sports Medicine

## 2019-06-12 DIAGNOSIS — M779 Enthesopathy, unspecified: Secondary | ICD-10-CM

## 2019-06-18 DIAGNOSIS — Z7689 Persons encountering health services in other specified circumstances: Secondary | ICD-10-CM | POA: Diagnosis not present

## 2019-06-18 DIAGNOSIS — E1140 Type 2 diabetes mellitus with diabetic neuropathy, unspecified: Secondary | ICD-10-CM | POA: Diagnosis not present

## 2019-06-18 DIAGNOSIS — K219 Gastro-esophageal reflux disease without esophagitis: Secondary | ICD-10-CM | POA: Diagnosis not present

## 2019-06-18 DIAGNOSIS — E663 Overweight: Secondary | ICD-10-CM | POA: Diagnosis not present

## 2019-06-18 DIAGNOSIS — M1990 Unspecified osteoarthritis, unspecified site: Secondary | ICD-10-CM | POA: Diagnosis not present

## 2019-06-18 DIAGNOSIS — Z6827 Body mass index (BMI) 27.0-27.9, adult: Secondary | ICD-10-CM | POA: Diagnosis not present

## 2019-06-18 DIAGNOSIS — R32 Unspecified urinary incontinence: Secondary | ICD-10-CM | POA: Diagnosis not present

## 2019-06-24 DIAGNOSIS — M25562 Pain in left knee: Secondary | ICD-10-CM | POA: Diagnosis not present

## 2019-06-24 DIAGNOSIS — M1712 Unilateral primary osteoarthritis, left knee: Secondary | ICD-10-CM | POA: Diagnosis not present

## 2019-06-25 DIAGNOSIS — M4316 Spondylolisthesis, lumbar region: Secondary | ICD-10-CM | POA: Diagnosis not present

## 2019-06-25 DIAGNOSIS — M545 Low back pain: Secondary | ICD-10-CM | POA: Diagnosis not present

## 2019-06-26 DIAGNOSIS — M545 Low back pain: Secondary | ICD-10-CM | POA: Diagnosis not present

## 2019-06-26 DIAGNOSIS — M4316 Spondylolisthesis, lumbar region: Secondary | ICD-10-CM | POA: Diagnosis not present

## 2019-07-01 ENCOUNTER — Telehealth: Payer: Self-pay | Admitting: Gastroenterology

## 2019-07-01 NOTE — Telephone Encounter (Signed)
Pt reported that she is still experiencing dysphagia and food stuck in her chest.  Please advise.

## 2019-07-02 ENCOUNTER — Ambulatory Visit: Payer: Medicare HMO | Admitting: Neurology

## 2019-07-02 DIAGNOSIS — M4316 Spondylolisthesis, lumbar region: Secondary | ICD-10-CM | POA: Diagnosis not present

## 2019-07-02 DIAGNOSIS — M545 Low back pain: Secondary | ICD-10-CM | POA: Diagnosis not present

## 2019-07-02 NOTE — Telephone Encounter (Signed)
Left message for patient to call back to the office;  

## 2019-07-03 NOTE — Telephone Encounter (Signed)
I have refaxed the order requisition. Patient was notified that she should be able to pik up her supplies for the stool testing, advised patient to call the office if she has any problems picking this up.

## 2019-07-03 NOTE — Telephone Encounter (Signed)
Called and spoke with patient-patient informed of MD recommendations; patient is agreeable with plan of care and has been scheduled for a f/u appt on 08/06/2019 at 3:00 pm;  Patient verbalized understanding of information/instructions;  Patient was advised to call the office at [phone] if questions/concerns arise;

## 2019-07-03 NOTE — Telephone Encounter (Signed)
Has chronic problems due to large hiatal hernia. Proceed with stool studies Needs to be seen for follow-up visit in 4 to 6 weeks. Can we work her into [facility] clinic?  RG

## 2019-07-03 NOTE — Telephone Encounter (Signed)
Patient returned your call, please call patient one more time.   

## 2019-07-03 NOTE — Telephone Encounter (Signed)
Called and spoke with patient-patient reports she is feeling better today than she was yesterday-patient reports that she is having trouble with not being able to belch and when she is able to she ends up vomiting; still feel like all of the food is getting stuck; is taking medications as prescribed- non pharmacological measures being used with no relief Potassium pills are not dissolving -"coming back up when I throw up"- "my food is not digesting"  Dr. Lyndel Safe -Please advise   [name]-patient reports she went to [facility] to pick up supplies for stool testing and they did not have the order-please assist in this as the patient was instructed to complete this at the [facility]

## 2019-07-09 DIAGNOSIS — M25512 Pain in left shoulder: Secondary | ICD-10-CM | POA: Diagnosis not present

## 2019-07-09 DIAGNOSIS — M545 Low back pain: Secondary | ICD-10-CM | POA: Diagnosis not present

## 2019-07-09 DIAGNOSIS — G8929 Other chronic pain: Secondary | ICD-10-CM | POA: Diagnosis not present

## 2019-07-10 DIAGNOSIS — M545 Low back pain: Secondary | ICD-10-CM | POA: Diagnosis not present

## 2019-07-10 DIAGNOSIS — M4316 Spondylolisthesis, lumbar region: Secondary | ICD-10-CM | POA: Diagnosis not present

## 2019-07-11 DIAGNOSIS — E559 Vitamin D deficiency, unspecified: Secondary | ICD-10-CM | POA: Diagnosis not present

## 2019-07-11 DIAGNOSIS — R32 Unspecified urinary incontinence: Secondary | ICD-10-CM | POA: Diagnosis not present

## 2019-07-11 DIAGNOSIS — K219 Gastro-esophageal reflux disease without esophagitis: Secondary | ICD-10-CM | POA: Diagnosis not present

## 2019-07-11 DIAGNOSIS — G8929 Other chronic pain: Secondary | ICD-10-CM

## 2019-07-11 DIAGNOSIS — E663 Overweight: Secondary | ICD-10-CM | POA: Diagnosis not present

## 2019-07-11 DIAGNOSIS — Z6827 Body mass index (BMI) 27.0-27.9, adult: Secondary | ICD-10-CM | POA: Diagnosis not present

## 2019-07-11 DIAGNOSIS — M545 Low back pain, unspecified: Secondary | ICD-10-CM

## 2019-07-11 DIAGNOSIS — E785 Hyperlipidemia, unspecified: Secondary | ICD-10-CM | POA: Diagnosis not present

## 2019-07-11 DIAGNOSIS — I2510 Atherosclerotic heart disease of native coronary artery without angina pectoris: Secondary | ICD-10-CM | POA: Diagnosis not present

## 2019-07-11 HISTORY — DX: Low back pain, unspecified: M54.50

## 2019-07-11 HISTORY — DX: Other chronic pain: G89.29

## 2019-07-17 ENCOUNTER — Encounter: Payer: Self-pay | Admitting: Gastroenterology

## 2019-07-17 DIAGNOSIS — S46001A Unspecified injury of muscle(s) and tendon(s) of the rotator cuff of right shoulder, initial encounter: Secondary | ICD-10-CM | POA: Diagnosis not present

## 2019-07-17 DIAGNOSIS — R197 Diarrhea, unspecified: Secondary | ICD-10-CM | POA: Diagnosis not present

## 2019-07-23 ENCOUNTER — Encounter: Payer: Self-pay | Admitting: Gastroenterology

## 2019-07-23 ENCOUNTER — Ambulatory Visit (INDEPENDENT_AMBULATORY_CARE_PROVIDER_SITE_OTHER): Payer: Medicare HMO | Admitting: Gastroenterology

## 2019-07-23 ENCOUNTER — Other Ambulatory Visit: Payer: Self-pay

## 2019-07-23 VITALS — BP 116/70 | HR 89 | Temp 97.3°F | Ht 65.5 in | Wt 170.5 lb

## 2019-07-23 DIAGNOSIS — R1319 Other dysphagia: Secondary | ICD-10-CM

## 2019-07-23 DIAGNOSIS — R1310 Dysphagia, unspecified: Secondary | ICD-10-CM

## 2019-07-23 DIAGNOSIS — K219 Gastro-esophageal reflux disease without esophagitis: Secondary | ICD-10-CM | POA: Diagnosis not present

## 2019-07-23 MED ORDER — PANTOPRAZOLE SODIUM 40 MG PO TBEC: 40.0000 mg | DELAYED_RELEASE_TABLET | Freq: Two times a day (BID) | ORAL | 6 refills | Status: DC

## 2019-07-23 MED ORDER — FAMOTIDINE 40 MG PO TABS: 40.0000 mg | ORAL_TABLET | Freq: Every day | ORAL | 6 refills | Status: DC

## 2019-07-23 NOTE — Progress Notes (Signed)
IMPRESSION and PLAN:    #1.  GERD with large HH with eso dysphagia, intermittent nausea/vomiting.. S/P EGD 12/2018 with dil (50Fr), large HH, hyperplastic gastric polyps. UGI 04/2019- HH, GERD, no stricture. #2.  Diarrhea.  New onset. H/O constipation. Neg CT AP 02/2018.  Has been on Trulicity. Neg stool studies for GI pathogens. #3.  H/O TAs (s/p colon 12/2018. Next due 12/2021).  Plan: - Increased Protonix 40mg  po bid, Famotidine 40mg  po qhs for now.  - GES to r/o diabetic gastroparesis.. - Eso manometry at [facility]. If continued problems, then would consider surgical eval for [facility] repair/gastropexy. Certainly, this will be the last choice.  I doubt if she is having intermittent gastric volvulus.  But, certainly a possibility. - Raise HOB (bricks) 30 to 45 degrees. - If still with problems in 2 weeks,  carafate 1g po qid. - Small but frequent foods. - Stop Linzess - FU in 12 weeks.  Earlier, if still with problems.  She will call us in 2 weeks and let us know how she is doing.      HPI:    Chief Complaint:   Deborah Hutchinson is a 74 y.o. female  For follow-up visit Felt better only for a short while after EGD with dilatation. Again having problems with dysphagia mostly to pills, early satiety, intermittent nausea/vomiting.  This time is associated with weight loss of 9 pounds over 2 months as detailed below.  We have gone over upper GI series from 04/26/2019 in detail including films.  It shows moderate hiatal hernia, severe gastroesophageal reflux.  No stricture.  Barium tablet passed without any problems.  Deborah Hutchinson is quite distressed with her symptoms.  She denies having any significant abdominal pain.  Nausea/vomiting can be intermittent and spontaneously gets better.  She could not identify any definite triggering factors.  No melena, hematochezia or hematemesis.  Diarrhea is somewhat better.  She would also get constipated at times and has to use Linzess 1-2 times per month.   No nocturnal symptoms.  Stool studies have been negative for GI pathogens.  No fever or chills.  Denies having any melena or hematochezia   Wt Readings from Last 3 Encounters:  07/23/19 170 lb 8 oz (77.3 kg)  06/10/19 181 lb (82.1 kg)  05/16/19 179 lb (81.2 kg)     Past GI procedures: -EGD 12/14/2018: Large HH, hyperplastic gastric polyps, eso stricture s/p dilatation 50 Fr. EGD 08/17/2015 mod HH, neg SB Bx for celiac. -UGI series 04/2019: HH, GERD, no strictures.  Ba tab passed without any problems. -Colonoscopy 12/14/2018-colonic polyps s/p polypectomy, mild pancolonic div. Bx- TA. -CT AP with contrast 02/09/2018: Neg, Stable left adrenal adenoma, small right inguinal hernia.  LHC 05/08/2019:  Prox RCA lesion is 50% stenosed.  Prox LAD lesion is 40% stenosed.  The left ventricular systolic function is normal.  LV end diastolic pressure is normal.  The left ventricular ejection fraction is greater than 65% by visual estimate.  There is no mitral valve regurgitation. Past Medical History:  Diagnosis Date  . Angina    normal stress test  . Anxiety   . Arthritis   . Asthma   . Bronchitis   . 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 Diabetes mellitus   . Diabetes mellitus due to underlying condition with unspecified complications ([place]) 0000000  . Dyslipidemia 06/10/2015  . Essential hypertension 06/10/2015  . GERD (gastroesophageal reflux disease)   .  H/O hiatal hernia   . Heart murmur   . Hyperlipemia   . Hypertension   . Nonspecific (abnormal) findings on radiological and other examination of gastrointestinal tract 05/19/2011  . Pain and swelling of toe of right foot 07/15/2016  . Palpitations 06/02/2016  . Shortness of breath    on  excertion  . Sleep apnea     Current Outpatient Medications  Medication Sig Dispense Refill  . albuterol (PROVENTIL HFA;VENTOLIN HFA) 108 (90 Base) MCG/ACT inhaler Inhale 2  puffs into the lungs every 6 (six) hours as needed for wheezing or shortness of breath. 3 Inhaler 3  . amLODipine (NORVASC) 10 MG tablet Take 10 mg by mouth daily.    . budesonide-formoterol (SYMBICORT) 160-4.5 MCG/ACT inhaler Inhale 2 puffs into the lungs 2 (two) times daily. 3 Inhaler 3  . buPROPion (WELLBUTRIN XL) 300 MG 24 hr tablet Take 300 mg by mouth daily.     . Cholecalciferol (VITAMIN D PO) Take 5,000 Units by mouth as directed. Every 2 weeks    . clopidogrel (PLAVIX) 75 MG tablet Take 1 tablet (75 mg total) by mouth daily. 30 tablet 11  . dicyclomine (BENTYL) 20 MG tablet Take 20 mg by mouth every 6 (six) hours as needed.     .      . escitalopram (LEXAPRO) 10 MG tablet Take 10 mg by mouth daily.    . famotidine (PEPCID) 40 MG tablet Take 1 tablet (40 mg total) by mouth at bedtime. 180 tablet 3  . fluticasone (VERAMYST) 27.5 MCG/SPRAY nasal spray Place 2 sprays into the nose daily.    Marland Kitchen gabapentin (NEURONTIN) 300 MG capsule Take 300 mg by mouth 3 (three) times daily as needed.     . hydrALAZINE (APRESOLINE) 50 MG tablet Take 1 tablet (50 mg total) by mouth 3 (three) times daily. 180 tablet 2  . hydrochlorothiazide (HYDRODIURIL) 25 MG tablet Take 1 tablet (25 mg total) by mouth daily. 60 tablet 2  . Lancets (ACCU-CHEK MULTICLIX) lancets     . linaclotide (LINZESS) 72 MCG capsule Take 72 mcg by mouth as needed.    Marland Kitchen losartan (COZAAR) 100 MG tablet Take 1 tablet (100 mg total) by mouth daily. 60 tablet 2  . metFORMIN (GLUCOPHAGE) 500 MG tablet Take 500 mg by mouth 2 (two) times daily with a meal.     . montelukast (SINGULAIR) 10 MG tablet Take 10 mg by mouth at bedtime.     . Multiple Vitamin (MULTIVITAMIN) capsule Take 1 capsule by mouth daily.      Marland Kitchen oxybutynin (DITROPAN-XL) 10 MG 24 hr tablet Take 10 mg by mouth daily.    . pantoprazole (PROTONIX) 40 MG tablet Take 1 tablet (40 mg total) by mouth daily. 60 tablet 2  . Potassium Chloride CR (MICRO-K) 8 MEQ CPCR capsule CR TAKE 1  CAPSULE BY MOUTH ONCE DAILY FOR LOW POTASSIUM    . simvastatin (ZOCOR) 20 MG tablet Take 20 mg by mouth at bedtime.    Marland Kitchen spironolactone (ALDACTONE) 25 MG tablet Take 1 tablet (25 mg total) by mouth daily. 60 tablet 2  . traMADol (ULTRAM) 50 MG tablet Take 50 mg by mouth 2 (two) times daily as needed for pain.    Marland Kitchen VICTOZA 18 MG/3ML SOPN 12 mg daily.     . BD PEN NEEDLE NANO U/F 32G X 4 MM MISC     . isosorbide mononitrate (IMDUR) 120 MG 24 hr tablet Take 1 tablet (120 mg total) by mouth daily. (  Patient not taking: Reported on 06/10/2019) 60 tablet 2  . nitroGLYCERIN (NITROSTAT) 0.4 MG SL tablet DISSOLVE ONE TABLET UNDER THE TONGUE EVERY 5 MINUTES AS NEEDED FOR CHEST PAIN.  DO NOT EXCEED A TOTAL OF 3 DOSES IN 15 MINUTES (Patient not taking: Reported on 06/10/2019) 25 tablet 5   No current facility-administered medications for this visit.    Past Surgical History:  Procedure Laterality Date  . ABDOMINAL HYSTERECTOMY    . ABDOMINAL HYSTERECTOMY    . BREAST SURGERY     begin mass,left  breast  . COLONOSCOPY  08/17/2015   Colonic polyp status post polypectomy. Mild pancolonic diverticulosis. Highly redundant colon.   Marland Kitchen DILATION AND CURETTAGE OF UTERUS     one  . ESOPHAGOGASTRODUODENOSCOPY  08/17/2015   Moderate hiatal hernia. Otherwise noraml EGD.   . EUS  05/19/2011   Procedure: UPPER ENDOSCOPIC ULTRASOUND (EUS) LINEAR;  Surgeon: Owens Loffler, MD;  Location: [facility];  Service: Endoscopy;  Laterality: N/A;  radial linear   . HYSTEROTOMY    . KNEE SURGERY    . LEFT HEART CATH AND CORONARY ANGIOGRAPHY    . LEFT HEART CATH AND CORONARY ANGIOGRAPHY N/A 05/08/2019   Procedure: LEFT HEART CATH AND CORONARY ANGIOGRAPHY;  Surgeon: Burnell Blanks, MD;  Location: [place] [facility];  Service: Cardiovascular;  Laterality: N/A;  . right thumb     tendon repaire  . SHOULDER SURGERY     rotator cuff repair  . WRIST SURGERY     rigth    Family History  Problem Relation Age of Onset  .  Diabetes Sister   . Hypertension Daughter   . Colon cancer Neg Hx     Social History   Tobacco Use  . Smoking status: Former Smoker    Packs/day: 1.00    Quit date: 05/17/2011    Years since quitting: 8.1  . Smokeless tobacco: Never Used  Substance Use Topics  . Alcohol use: No  . Drug use: No    Allergies  Allergen Reactions  . Penicillins Rash    Reaction was 30years ago  Has never taken again     Review of Systems: All systems reviewed and negative except where noted in HPI.    Physical Exam:     BP 116/70   Pulse 89   Temp (!) 97.3 F (36.3 C)   Ht 5' 5.5" (1.664 m)   Wt 170 lb 8 oz (77.3 kg)   BMI 27.94 kg/m  Not examined since it was a televisit.  I connected with  Deborah Hutchinson on 07/23/19 by a phone visit (video visit failed) and verified that I am speaking with the correct person using two identifiers.   I discussed the limitations of evaluation and management by telemedicine. The patient expressed understanding and agreed to proceed.  Time spent: Call, coordination of care, review of records: 25 min      Acie Custis,MD 07/23/2019, 2:06 PM   CC [name], MD

## 2019-07-23 NOTE — Patient Instructions (Signed)
If you are age 76 or older, your body mass index should be between 23-30. Your Body mass index is 27.94 kg/m. If this is out of the aforementioned range listed, please consider follow up with your Primary Care Provider.  If you are age 37 or younger, your body mass index should be between 19-25. Your Body mass index is 27.94 kg/m. If this is out of the aformentioned range listed, please consider follow up with your Primary Care Provider.   We have sent the following medications to your pharmacy for you to pick up at your convenience: Protonix  Famotidine   You have been scheduled for a gastric emptying scan at [facility] on 08/09/19 at 7:30am. Please arrive at least 15 minutes prior to your appointment for registration. Please make certain not to have anything to eat or drink after midnight the night before your test. Hold all stomach medications (ex: Zofran, phenergan, Reglan) 48 hours prior to your test. If you need to reschedule your appointment, please contact radiology scheduling at [phone]. _____________________________________________________________________ A gastric-emptying study measures how long it takes for food to move through your stomach. There are several ways to measure stomach emptying. In the most common test, you eat food that contains a small amount of radioactive material. A scanner that detects the movement of the radioactive material is placed over your abdomen to monitor the rate at which food leaves your stomach. This test normally takes about 4 hours to complete. _____________________________________________________________________   Raise head of bed 30-45 degrees.   Follow up in 12 weeks.   Thank you,  Dr. Jackquline Denmark

## 2019-07-24 DIAGNOSIS — M5126 Other intervertebral disc displacement, lumbar region: Secondary | ICD-10-CM | POA: Diagnosis not present

## 2019-07-24 DIAGNOSIS — M47816 Spondylosis without myelopathy or radiculopathy, lumbar region: Secondary | ICD-10-CM | POA: Diagnosis not present

## 2019-07-24 DIAGNOSIS — M48061 Spinal stenosis, lumbar region without neurogenic claudication: Secondary | ICD-10-CM | POA: Diagnosis not present

## 2019-07-24 DIAGNOSIS — M4316 Spondylolisthesis, lumbar region: Secondary | ICD-10-CM | POA: Diagnosis not present

## 2019-07-24 DIAGNOSIS — M545 Low back pain: Secondary | ICD-10-CM | POA: Diagnosis not present

## 2019-07-25 ENCOUNTER — Telehealth: Payer: Self-pay | Admitting: Gastroenterology

## 2019-07-25 NOTE — Telephone Encounter (Signed)
Patient called said you scheduled her for an appt on Monday and she does not know where to go please advise.

## 2019-07-26 DIAGNOSIS — M7918 Myalgia, other site: Secondary | ICD-10-CM | POA: Diagnosis not present

## 2019-07-26 DIAGNOSIS — G8929 Other chronic pain: Secondary | ICD-10-CM | POA: Diagnosis not present

## 2019-07-26 DIAGNOSIS — M533 Sacrococcygeal disorders, not elsewhere classified: Secondary | ICD-10-CM | POA: Diagnosis not present

## 2019-07-26 DIAGNOSIS — M545 Low back pain: Secondary | ICD-10-CM | POA: Diagnosis not present

## 2019-07-26 NOTE — Telephone Encounter (Signed)
Attempted to reach patient-unable to leave a VM as mailbox is not set up-will attempt to reach patient at a later date/time; 

## 2019-07-29 ENCOUNTER — Telehealth: Payer: Self-pay | Admitting: Gastroenterology

## 2019-07-30 NOTE — Telephone Encounter (Signed)
Called and spoke with patient-patient's questions answered at this time; Patient advised to call back to the office at [phone] should questions/concerns arise;  Patient verbalized understanding of information/instructions;

## 2019-08-06 ENCOUNTER — Ambulatory Visit: Payer: Medicare HMO | Admitting: Nurse Practitioner

## 2019-08-06 ENCOUNTER — Other Ambulatory Visit: Payer: Self-pay

## 2019-08-06 ENCOUNTER — Ambulatory Visit (INDEPENDENT_AMBULATORY_CARE_PROVIDER_SITE_OTHER): Payer: Medicare HMO | Admitting: Gastroenterology

## 2019-08-06 ENCOUNTER — Encounter: Payer: Self-pay | Admitting: Gastroenterology

## 2019-08-06 VITALS — BP 140/78 | HR 84 | Temp 98.1°F | Ht 65.0 in | Wt 173.5 lb

## 2019-08-06 DIAGNOSIS — K219 Gastro-esophageal reflux disease without esophagitis: Secondary | ICD-10-CM | POA: Diagnosis not present

## 2019-08-06 NOTE — Progress Notes (Signed)
IMPRESSION and PLAN:    #1.  GERD with large HH with eso dysphagia, intermittent nausea/vomiting.. S/P EGD 12/2018 with dil (50Fr), large HH, hyperplastic gastric polyps. UGI 04/2019- HH, GERD, no stricture. #2.  Diarrhea.  New onset. H/O constipation. Neg CT AP 02/2018.  Has been on Trulicity. Neg stool studies for GI pathogens. #3.  H/O TAs (s/p colon 12/2018. Next due 12/2021).  Plan: - Increased Protonix 40mg  po bid, Famotidine 40mg  po qhs for now.  - GES to r/o diabetic gastroparesis.Marland Kitchen - If still with problems, Eso manometry at [facility]. If continued problems, then would consider surgical eval for [facility] repair/gastropexy. Certainly, this will be the last choice.  I doubt if she is having intermittent gastric volvulus.  But, certainly a possibility. - Raise HOB (bricks) 30 to 45 degrees. - If still with problems in 2 weeks,  carafate 1g po qid. - Small but frequent foods. - Stop Linzess - FU in 12 weeks.  Earlier, if still with problems.  She will call us in 2 weeks and let us know how she is doing.      HPI:    Chief Complaint:   Deborah Hutchinson is a 74 y.o. female  For follow-up visit Felt better only for a short while after EGD with dilatation. Again having problems with dysphagia mostly to pills, early satiety, intermittent nausea/vomiting.  This time is associated with weight loss of 9 pounds over 2 months as detailed below.  We have gone over upper GI series from 04/26/2019 in detail including films.  It shows moderate hiatal hernia, severe gastroesophageal reflux.  No stricture.  Barium tablet passed without any problems.  Deborah Hutchinson is quite distressed with her symptoms.  She denies having any significant abdominal pain.  Nausea/vomiting can be intermittent and spontaneously gets better.  She could not identify any definite triggering factors.  No melena, hematochezia or hematemesis.  Diarrhea is somewhat better.  She would also get constipated at times and has to use  Linzess 1-2 times per month.  No nocturnal symptoms.  Stool studies have been negative for GI pathogens.  No fever or chills.  Denies having any melena or hematochezia   Wt Readings from Last 3 Encounters:  08/06/19 173 lb 8 oz (78.7 kg)  07/23/19 170 lb 8 oz (77.3 kg)  06/10/19 181 lb (82.1 kg)     Past GI procedures: -EGD 12/14/2018: Large HH, hyperplastic gastric polyps, eso stricture s/p dilatation 50 Fr. EGD 08/17/2015 mod HH, neg SB Bx for celiac. -UGI series 04/2019: HH, GERD, no strictures.  Ba tab passed without any problems. -Colonoscopy 12/14/2018-colonic polyps s/p polypectomy, mild pancolonic div. Bx- TA. -CT AP with contrast 02/09/2018: Neg, Stable left adrenal adenoma, small right inguinal hernia.  LHC 05/08/2019:  Prox RCA lesion is 50% stenosed.  Prox LAD lesion is 40% stenosed.  The left ventricular systolic function is normal.  LV end diastolic pressure is normal.  The left ventricular ejection fraction is greater than 65% by visual estimate.  There is no mitral valve regurgitation. Past Medical History:  Diagnosis Date  . Angina    normal stress test  . Anxiety   . Arthritis   . Asthma   . Bronchitis   . 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 Diabetes mellitus   . Diabetes mellitus due to underlying condition with unspecified complications ([place]) 0000000  . Dyslipidemia 06/10/2015  . Essential hypertension 06/10/2015  .  GERD (gastroesophageal reflux disease)   . H/O hiatal hernia   . Heart murmur   . Hyperlipemia   . Hypertension   . Nonspecific (abnormal) findings on radiological and other examination of gastrointestinal tract 05/19/2011  . Pain and swelling of toe of right foot 07/15/2016  . Palpitations 06/02/2016  . Shortness of breath    on  excertion  . Sleep apnea     Current Outpatient Medications  Medication Sig Dispense Refill  . albuterol (PROVENTIL HFA;VENTOLIN HFA) 108  (90 Base) MCG/ACT inhaler Inhale 2 puffs into the lungs every 6 (six) hours as needed for wheezing or shortness of breath. 3 Inhaler 3  . amLODipine (NORVASC) 10 MG tablet Take 10 mg by mouth daily.    . budesonide-formoterol (SYMBICORT) 160-4.5 MCG/ACT inhaler Inhale 2 puffs into the lungs 2 (two) times daily. 3 Inhaler 3  . buPROPion (WELLBUTRIN XL) 300 MG 24 hr tablet Take 300 mg by mouth daily.     . Cholecalciferol (VITAMIN D PO) Take 5,000 Units by mouth as directed. Every 2 weeks    . clopidogrel (PLAVIX) 75 MG tablet Take 1 tablet (75 mg total) by mouth daily. 30 tablet 11  . dicyclomine (BENTYL) 20 MG tablet Take 20 mg by mouth every 6 (six) hours as needed.     .      . escitalopram (LEXAPRO) 10 MG tablet Take 10 mg by mouth daily.    . famotidine (PEPCID) 40 MG tablet Take 1 tablet (40 mg total) by mouth at bedtime. 180 tablet 3  . fluticasone (VERAMYST) 27.5 MCG/SPRAY nasal spray Place 2 sprays into the nose daily.    Marland Kitchen gabapentin (NEURONTIN) 300 MG capsule Take 300 mg by mouth 3 (three) times daily as needed.     . hydrALAZINE (APRESOLINE) 50 MG tablet Take 1 tablet (50 mg total) by mouth 3 (three) times daily. 180 tablet 2  . hydrochlorothiazide (HYDRODIURIL) 25 MG tablet Take 1 tablet (25 mg total) by mouth daily. 60 tablet 2  . Lancets (ACCU-CHEK MULTICLIX) lancets     . linaclotide (LINZESS) 72 MCG capsule Take 72 mcg by mouth as needed.    Marland Kitchen losartan (COZAAR) 100 MG tablet Take 1 tablet (100 mg total) by mouth daily. 60 tablet 2  . metFORMIN (GLUCOPHAGE) 500 MG tablet Take 500 mg by mouth 2 (two) times daily with a meal.     . montelukast (SINGULAIR) 10 MG tablet Take 10 mg by mouth at bedtime.     . Multiple Vitamin (MULTIVITAMIN) capsule Take 1 capsule by mouth daily.      Marland Kitchen oxybutynin (DITROPAN-XL) 10 MG 24 hr tablet Take 10 mg by mouth daily.    . pantoprazole (PROTONIX) 40 MG tablet Take 1 tablet (40 mg total) by mouth daily. 60 tablet 2  . Potassium Chloride CR (MICRO-K)  8 MEQ CPCR capsule CR TAKE 1 CAPSULE BY MOUTH ONCE DAILY FOR LOW POTASSIUM    . simvastatin (ZOCOR) 20 MG tablet Take 20 mg by mouth at bedtime.    Marland Kitchen spironolactone (ALDACTONE) 25 MG tablet Take 1 tablet (25 mg total) by mouth daily. 60 tablet 2  . traMADol (ULTRAM) 50 MG tablet Take 50 mg by mouth 2 (two) times daily as needed for pain.    Marland Kitchen VICTOZA 18 MG/3ML SOPN 12 mg daily.     . BD PEN NEEDLE NANO U/F 32G X 4 MM MISC     . isosorbide mononitrate (IMDUR) 120 MG 24 hr tablet Take 1  tablet (120 mg total) by mouth daily. (Patient not taking: Reported on 06/10/2019) 60 tablet 2  . nitroGLYCERIN (NITROSTAT) 0.4 MG SL tablet DISSOLVE ONE TABLET UNDER THE TONGUE EVERY 5 MINUTES AS NEEDED FOR CHEST PAIN.  DO NOT EXCEED A TOTAL OF 3 DOSES IN 15 MINUTES (Patient not taking: Reported on 06/10/2019) 25 tablet 5   No current facility-administered medications for this visit.    Past Surgical History:  Procedure Laterality Date  . ABDOMINAL HYSTERECTOMY    . ABDOMINAL HYSTERECTOMY    . BREAST SURGERY     begin mass,left  breast  . COLONOSCOPY  08/17/2015   Colonic polyp status post polypectomy. Mild pancolonic diverticulosis. Highly redundant colon.   Marland Kitchen DILATION AND CURETTAGE OF UTERUS     one  . ESOPHAGOGASTRODUODENOSCOPY  08/17/2015   Moderate hiatal hernia. Otherwise noraml EGD.   . EUS  05/19/2011   Procedure: UPPER ENDOSCOPIC ULTRASOUND (EUS) LINEAR;  Surgeon: Owens Loffler, MD;  Location: [facility];  Service: Endoscopy;  Laterality: N/A;  radial linear   . HYSTEROTOMY    . KNEE SURGERY    . LEFT HEART CATH AND CORONARY ANGIOGRAPHY    . LEFT HEART CATH AND CORONARY ANGIOGRAPHY N/A 05/08/2019   Procedure: LEFT HEART CATH AND CORONARY ANGIOGRAPHY;  Surgeon: Burnell Blanks, MD;  Location: [place] [facility];  Service: Cardiovascular;  Laterality: N/A;  . right thumb     tendon repaire  . SHOULDER SURGERY     rotator cuff repair  . WRIST SURGERY     rigth    Family History    Problem Relation Age of Onset  . Diabetes Sister   . Hypertension Daughter   . Colon cancer Neg Hx     Social History   Tobacco Use  . Smoking status: Former Smoker    Packs/day: 1.00    Quit date: 05/17/2011    Years since quitting: 8.2  . Smokeless tobacco: Never Used  Substance Use Topics  . Alcohol use: No  . Drug use: No    Allergies  Allergen Reactions  . Penicillins Rash    Reaction was 30years ago  Has never taken again     Review of Systems: All systems reviewed and negative except where noted in HPI.    Physical Exam:     BP 140/78   Pulse 84   Temp 98.1 F (36.7 C)   Ht 5\' 5"  (1.651 m)   Wt 173 lb 8 oz (78.7 kg)   BMI 28.87 kg/m   Gen: awake, alert, NAD HEENT: anicteric, no pallor CV: RRR, no mrg Pulm: CTA b/l Abd: soft, NT/ND, +BS throughout Ext: no c/c/e Neuro: nonfocal      Nia Nathaniel,MD 08/06/2019, 3:39 PM

## 2019-08-06 NOTE — Patient Instructions (Signed)
If you are age 76 or older, your body mass index should be between 23-30. Your Body mass index is 28.87 kg/m. If this is out of the aforementioned range listed, please consider follow up with your Primary Care Provider.  If you are age 66 or younger, your body mass index should be between 19-25. Your Body mass index is 28.87 kg/m. If this is out of the aformentioned range listed, please consider follow up with your Primary Care Provider.   You have been scheduled for a gastric emptying scan at [facility] on 08/09/19 at 7:30am. Please arrive at least 15 minutes prior to your appointment for registration. Please make certain not to have anything to eat or drink after midnight the night before your test. Hold all stomach medications (ex: Zofran, phenergan, Reglan) 48 hours prior to your test. If you need to reschedule your appointment, please contact radiology scheduling at [phone]. _____________________________________________________________________ A gastric-emptying study measures how long it takes for food to move through your stomach. There are several ways to measure stomach emptying. In the most common test, you eat food that contains a small amount of radioactive material. A scanner that detects the movement of the radioactive material is placed over your abdomen to monitor the rate at which food leaves your stomach. This test normally takes about 4 hours to complete.  Thank you,  Dr. Jackquline Denmark

## 2019-08-09 ENCOUNTER — Ambulatory Visit (HOSPITAL_COMMUNITY): Payer: Medicare HMO

## 2019-08-12 ENCOUNTER — Telehealth: Payer: Self-pay | Admitting: Gastroenterology

## 2019-08-12 DIAGNOSIS — M533 Sacrococcygeal disorders, not elsewhere classified: Secondary | ICD-10-CM | POA: Diagnosis not present

## 2019-08-12 DIAGNOSIS — M7918 Myalgia, other site: Secondary | ICD-10-CM | POA: Diagnosis not present

## 2019-08-12 DIAGNOSIS — M5416 Radiculopathy, lumbar region: Secondary | ICD-10-CM | POA: Diagnosis not present

## 2019-08-12 DIAGNOSIS — M545 Low back pain: Secondary | ICD-10-CM | POA: Diagnosis not present

## 2019-08-12 DIAGNOSIS — G8929 Other chronic pain: Secondary | ICD-10-CM | POA: Diagnosis not present

## 2019-08-16 ENCOUNTER — Other Ambulatory Visit: Payer: Self-pay

## 2019-08-16 DIAGNOSIS — K140 Glossitis: Secondary | ICD-10-CM | POA: Diagnosis not present

## 2019-08-16 DIAGNOSIS — M1990 Unspecified osteoarthritis, unspecified site: Secondary | ICD-10-CM | POA: Diagnosis not present

## 2019-08-16 DIAGNOSIS — R110 Nausea: Secondary | ICD-10-CM | POA: Diagnosis not present

## 2019-08-16 DIAGNOSIS — Z09 Encounter for follow-up examination after completed treatment for conditions other than malignant neoplasm: Secondary | ICD-10-CM | POA: Diagnosis not present

## 2019-08-16 DIAGNOSIS — S46001A Unspecified injury of muscle(s) and tendon(s) of the rotator cuff of right shoulder, initial encounter: Secondary | ICD-10-CM | POA: Diagnosis not present

## 2019-08-16 DIAGNOSIS — K219 Gastro-esophageal reflux disease without esophagitis: Secondary | ICD-10-CM | POA: Diagnosis not present

## 2019-08-19 ENCOUNTER — Other Ambulatory Visit: Payer: Self-pay

## 2019-08-19 ENCOUNTER — Ambulatory Visit (INDEPENDENT_AMBULATORY_CARE_PROVIDER_SITE_OTHER): Payer: Medicare HMO | Admitting: Cardiology

## 2019-08-19 ENCOUNTER — Encounter: Payer: Self-pay | Admitting: Cardiology

## 2019-08-19 VITALS — BP 140/70 | HR 74 | Temp 97.2°F | Ht 65.5 in | Wt 171.2 lb

## 2019-08-19 DIAGNOSIS — I350 Nonrheumatic aortic (valve) stenosis: Secondary | ICD-10-CM

## 2019-08-19 DIAGNOSIS — I10 Essential (primary) hypertension: Secondary | ICD-10-CM | POA: Diagnosis not present

## 2019-08-19 DIAGNOSIS — E088 Diabetes mellitus due to underlying condition with unspecified complications: Secondary | ICD-10-CM | POA: Diagnosis not present

## 2019-08-19 DIAGNOSIS — I2510 Atherosclerotic heart disease of native coronary artery without angina pectoris: Secondary | ICD-10-CM | POA: Diagnosis not present

## 2019-08-19 DIAGNOSIS — E782 Mixed hyperlipidemia: Secondary | ICD-10-CM | POA: Diagnosis not present

## 2019-08-19 DIAGNOSIS — G4733 Obstructive sleep apnea (adult) (pediatric): Secondary | ICD-10-CM | POA: Diagnosis not present

## 2019-08-19 LAB — BASIC METABOLIC PANEL
BUN/Creatinine Ratio: 13 (ref 12–28)
BUN: 15 mg/dL (ref 8–27)
CO2: 22 mmol/L (ref 20–29)
Calcium: 10.3 mg/dL (ref 8.7–10.3)
Chloride: 103 mmol/L (ref 96–106)
Creatinine, Ser: 1.13 mg/dL — ABNORMAL HIGH (ref 0.57–1.00)
GFR calc Af Amer: 55 mL/min/{1.73_m2} — ABNORMAL LOW (ref >59)
GFR calc non Af Amer: 47 mL/min/{1.73_m2} — ABNORMAL LOW (ref >59)
Glucose: 90 mg/dL (ref 65–99)
Potassium: 4.2 mmol/L (ref 3.5–5.2)
Sodium: 140 mmol/L (ref 134–144)

## 2019-08-19 LAB — LIPID PANEL
Chol/HDL Ratio: 2.2 ratio (ref 0.0–4.4)
Cholesterol, Total: 199 mg/dL (ref 100–199)
HDL: 89 mg/dL (ref >39)
LDL Chol Calc (NIH): 97 mg/dL (ref 0–99)
Triglycerides: 73 mg/dL (ref 0–149)
VLDL Cholesterol Cal: 13 mg/dL (ref 5–40)

## 2019-08-19 LAB — HEPATIC FUNCTION PANEL
ALT: 18 IU/L (ref 0–32)
AST: 15 IU/L (ref 0–40)
Albumin: 4.4 g/dL (ref 3.7–4.7)
Alkaline Phosphatase: 77 IU/L (ref 39–117)
Bilirubin Total: <0.2 mg/dL (ref 0.0–1.2)
Bilirubin, Direct: 0.06 mg/dL (ref 0.00–0.40)
Total Protein: 6.5 g/dL (ref 6.0–8.5)

## 2019-08-19 NOTE — Patient Instructions (Signed)
Medication Instructions:  No medication changes. *If you need a refill on your cardiac medications before your next appointment, please call your pharmacy*   Lab Work: Your physician recommends that you have labs today in the office. You had a BMET, LFT and Lipids done.  If you have labs (blood work) drawn today and your tests are completely normal, you will receive your results only by: Marland Kitchen MyChart Message (if you have MyChart) OR . A paper copy in the mail If you have any lab test that is abnormal or we need to change your treatment, we will call you to review the results.   Testing/Procedures: None ordered   Follow-Up: At [facility], you and your health needs are our priority.  As part of our continuing mission to provide you with exceptional heart care, we have created designated Provider Care Teams.  These Care Teams include your primary Cardiologist (physician) and Advanced Practice Providers (APPs -  Physician Assistants and Nurse Practitioners) who all work together to provide you with the care you need, when you need it.  We recommend signing up for the patient portal called "MyChart".  Sign up information is provided on this After Visit Summary.  MyChart is used to connect with patients for Virtual Visits (Telemedicine).  Patients are able to view lab/test results, encounter notes, upcoming appointments, etc.  Non-urgent messages can be sent to your provider as well.   To learn more about what you can do with MyChart, go to [email or web address].    Your next appointment:   6 month(s)  The format for your next appointment:   In Person  Provider:   Jyl Heinz, MD   Other Instructions NA

## 2019-08-19 NOTE — Telephone Encounter (Signed)
Can try Zofran 4 mg ODT every 8 hours as needed, #30, 1 refill RG

## 2019-08-19 NOTE — Telephone Encounter (Signed)
Please advise 

## 2019-08-19 NOTE — Progress Notes (Signed)
Cardiology Office Note:    Date:  08/19/2019   ID:  Deborah Hutchinson, DOB June 22, 1945, MRN [id number]  PCP:  Deborah Johns, MD  Cardiologist:  Deborah Lindau, MD   Referring MD: Deborah Johns, MD    ASSESSMENT:    1. Coronary artery disease involving native coronary artery of native heart without angina pectoris   2. Essential hypertension   3. Moderate aortic stenosis   4. OSA (obstructive sleep apnea)   5. Mixed dyslipidemia   6. Diabetes mellitus due to underlying condition with unspecified complications ([place])    PLAN:    In order of problems listed above:  1. Coronary artery disease: Secondary prevention stressed with the patient.  Importance of compliance with diet medication stressed and she vocalized understanding.  Importance of regular exercise stressed.  She ambulates with a cane. 2. Essential hypertension: Blood pressure is stable 3. Mixed dyslipidemia: Diet was emphasized and she will have blood work today including fasting lipids. 4. Diabetes mellitus: I also discussed diabetes related issues including diet and weight loss. 5. Sleep apnea: Sleep health issues were discussed 6. Patient will be seen in follow-up appointment in 6 months or earlier if the patient has any concerns    Medication Adjustments/Labs and Tests Ordered: Current medicines are reviewed at length with the patient today.  Concerns regarding medicines are outlined above.  No orders of the defined types were placed in this encounter.  No orders of the defined types were placed in this encounter.    No chief complaint on file.    History of Present Illness:    Deborah Hutchinson is a 74 y.o. female.  Patient has past medical history of coronary artery disease, moderate aortic stenosis, essential hypertension dyslipidemia and diabetes mellitus.  She has sleep apnea.  She denies any problems at this time and takes care of activities of daily living.  No chest pain orthopnea or PND.  At the  time of my evaluation, the patient is alert awake oriented and in no distress.  Past Medical History:  Diagnosis Date   Angina    normal stress test   Anxiety    Arthritis    Asthma    Bronchitis    CAD (coronary artery disease) 02/13/2019   Chest pain 05/08/2019   Chest pain of uncertain etiology    Chronic bilateral low back pain without sciatica 07/11/2019   Formatting of this note might be different from the original. Added automatically from request for surgery [id number]   COPD (chronic obstructive pulmonary disease) ([place]) 02/09/2017   Coronary artery disease involving native coronary artery of native heart without angina pectoris 06/10/2015   Diabetes mellitus    Diabetes mellitus due to underlying condition with unspecified complications ([place]) 0000000   Dyslipidemia 06/10/2015   Essential hypertension 06/10/2015   GERD (gastroesophageal reflux disease)    H/O hiatal hernia    Headache 09/24/2018   Heart murmur    Hyperlipemia    Hypertension    Mixed dyslipidemia 06/10/2015   Moderate aortic stenosis 05/16/2019   Nonspecific (abnormal) findings on radiological and other examination of gastrointestinal tract 05/19/2011   OSA (obstructive sleep apnea) 02/09/2017   Pain and swelling of toe of right foot 07/15/2016   Palpitations 06/02/2016   Shortness of breath    on  excertion   Sleep apnea     Past Surgical History:  Procedure Laterality Date   ABDOMINAL HYSTERECTOMY     ABDOMINAL HYSTERECTOMY     BREAST  SURGERY     begin mass,left  breast   COLONOSCOPY  08/17/2015   Colonic polyp status post polypectomy. Mild pancolonic diverticulosis. Highly redundant colon.    DILATION AND CURETTAGE OF UTERUS     one   ESOPHAGOGASTRODUODENOSCOPY  08/17/2015   Moderate hiatal hernia. Otherwise noraml EGD.    EUS  05/19/2011   Procedure: UPPER ENDOSCOPIC ULTRASOUND (EUS) LINEAR;  Surgeon: Owens Loffler, MD;  Location: [facility];  Service: Endoscopy;   Laterality: N/A;  radial linear    HYSTEROTOMY     KNEE SURGERY     LEFT HEART CATH AND CORONARY ANGIOGRAPHY     LEFT HEART CATH AND CORONARY ANGIOGRAPHY N/A 05/08/2019   Procedure: LEFT HEART CATH AND CORONARY ANGIOGRAPHY;  Surgeon: Burnell Blanks, MD;  Location: [facility];  Service: Cardiovascular;  Laterality: N/A;   right thumb     tendon repaire   SHOULDER SURGERY     rotator cuff repair   WRIST SURGERY     rigth    Current Medications: Current Meds  Medication Sig   albuterol (PROVENTIL HFA;VENTOLIN HFA) 108 (90 Base) MCG/ACT inhaler Inhale 2 puffs into the lungs every 6 (six) hours as needed for wheezing or shortness of breath.   amLODipine (NORVASC) 10 MG tablet Take 10 mg by mouth daily.   BD PEN NEEDLE NANO U/F 32G X 4 MM MISC    budesonide-formoterol (SYMBICORT) 160-4.5 MCG/ACT inhaler Inhale 2 puffs into the lungs 2 (two) times daily.   buPROPion (WELLBUTRIN XL) 300 MG 24 hr tablet Take 300 mg by mouth daily.    celecoxib (CELEBREX) 100 MG capsule    cetirizine (ZYRTEC) 10 MG tablet Take 1 tablet by mouth at bedtime.   Cholecalciferol (VITAMIN D PO) Take 5,000 Units by mouth as directed. Every 2 weeks   clopidogrel (PLAVIX) 75 MG tablet Take 1 tablet (75 mg total) by mouth daily.   dicyclomine (BENTYL) 20 MG tablet Take 20 mg by mouth every 6 (six) hours as needed.    escitalopram (LEXAPRO) 10 MG tablet Take 10 mg by mouth daily.   famotidine (PEPCID) 40 MG tablet Take 1 tablet (40 mg total) by mouth at bedtime.   fluticasone (VERAMYST) 27.5 MCG/SPRAY nasal spray Place 2 sprays into the nose daily.   gabapentin (NEURONTIN) 300 MG capsule Take 300 mg by mouth 3 (three) times daily as needed.    hydrALAZINE (APRESOLINE) 50 MG tablet Take 1 tablet (50 mg total) by mouth 3 (three) times daily.   hydrochlorothiazide (HYDRODIURIL) 25 MG tablet Take 1 tablet (25 mg total) by mouth daily.   isosorbide mononitrate (IMDUR) 120 MG 24 hr  tablet Take 1 tablet (120 mg total) by mouth daily.   Lancets (ACCU-CHEK MULTICLIX) lancets    linaclotide (LINZESS) 72 MCG capsule Take 72 mcg by mouth as needed.   losartan (COZAAR) 100 MG tablet Take 1 tablet (100 mg total) by mouth daily.   metFORMIN (GLUCOPHAGE) 500 MG tablet Take 500 mg by mouth 2 (two) times daily with a meal.    montelukast (SINGULAIR) 10 MG tablet Take 10 mg by mouth at bedtime.    Multiple Vitamin (MULTIVITAMIN) capsule Take 1 capsule by mouth daily.     nitroGLYCERIN (NITROSTAT) 0.4 MG SL tablet DISSOLVE ONE TABLET UNDER THE TONGUE EVERY 5 MINUTES AS NEEDED FOR CHEST PAIN.  DO NOT EXCEED A TOTAL OF 3 DOSES IN 15 MINUTES   oxybutynin (DITROPAN-XL) 10 MG 24 hr tablet Take 10 mg by mouth  daily.   pantoprazole (PROTONIX) 40 MG tablet Take 1 tablet (40 mg total) by mouth 2 (two) times daily.   Potassium Chloride CR (MICRO-K) 8 MEQ CPCR capsule CR TAKE 1 CAPSULE BY MOUTH ONCE DAILY FOR LOW POTASSIUM   simvastatin (ZOCOR) 20 MG tablet Take 20 mg by mouth at bedtime.   spironolactone (ALDACTONE) 25 MG tablet Take 1 tablet (25 mg total) by mouth daily.   traMADol (ULTRAM) 50 MG tablet Take 50 mg by mouth 2 (two) times daily as needed for pain.   VICTOZA 18 MG/3ML SOPN 12 mg daily.      Allergies:   Penicillins   Social History   Socioeconomic History   Marital status: Single    Spouse name: Not on file   Number of children: 3   Years of education: Not on file   Highest education level: Not on file  Occupational History   Occupation: retired  Tobacco Use   Smoking status: Former Smoker    Packs/day: 1.00    Quit date: 05/17/2011    Years since quitting: 8.2   Smokeless tobacco: Never Used  Substance and Sexual Activity   Alcohol use: No   Drug use: No   Sexual activity: Never  Other Topics Concern   Not on file  Social History Narrative   Not on file   Social Determinants of Health   Financial Resource Strain:    Difficulty of  Paying Living Expenses:   Food Insecurity:    Worried About Charity fundraiser in the Last Year:    Arboriculturist in the Last Year:   Transportation Needs:    Film/video editor (Medical):    Lack of Transportation (Non-Medical):   Physical Activity:    Days of Exercise per Week:    Minutes of Exercise per Session:   Stress:    Feeling of Stress :   Social Connections:    Frequency of Communication with Friends and Family:    Frequency of Social Gatherings with Friends and Family:    Attends Religious Services:    Active Member of Clubs or Organizations:    Attends Archivist Meetings:    Marital Status:      Family History: The patient's family history includes Diabetes in her sister; Hypertension in her daughter. There is no history of Colon cancer.  ROS:   Please see the history of present illness.    All other systems reviewed and are negative.  EKGs/Labs/Other Studies Reviewed:    The following studies were reviewed today: IMPRESSIONS    1. Left ventricular ejection fraction, by visual estimation, is 65 to  70%. The left ventricle has hyperdynamic function. There is mildly  increased left ventricular hypertrophy.  2. Elevated left atrial and left ventricular end-diastolic pressures.  3. Left ventricular diastolic parameters are consistent with Grade I  diastolic dysfunction (impaired relaxation).  4. The left ventricle has no regional wall motion abnormalities.  5. Global right ventricle has normal systolic function.The right  ventricular size is normal. No increase in right ventricular wall  thickness.  6. Left atrial size was mildly dilated.  7. Right atrial size was normal.  8. Mild mitral annular calcification.  9. The mitral valve is abnormal. Trivial mitral valve regurgitation.  10. The tricuspid valve is grossly normal.  11. Aortic valve area, by VTI measures 1.16 cm.  12. Aortic valve mean gradient measures 15.4  mmHg.  13. Aortic valve peak gradient measures 31.6 mmHg.  14. The aortic valve is tricuspid. Aortic valve regurgitation is trivial.  Moderate aortic valve stenosis.  15. The pulmonic valve was grossly normal. Pulmonic valve regurgitation is  not visualized.  16. The inferior vena cava is normal in size with greater than 50%  respiratory variability, suggesting right atrial pressure of 3 mmHg.    Recent Labs: 09/24/2018: ALT 20; Pro B Natriuretic peptide (BNP) 36.0; TSH 1.56 05/09/2019: BUN 8; Creatinine, Ser 0.74; Hemoglobin 11.4; Platelets 312; Potassium 3.7; Sodium 142  Recent Lipid Panel    Component Value Date/Time   CHOL 169 03/06/2018 1510   TRIG 101 03/06/2018 1510   HDL 88 03/06/2018 1510   CHOLHDL 1.9 03/06/2018 1510   LDLCALC 61 03/06/2018 1510    Physical Exam:    VS:  BP 140/70    Pulse 74    Temp (!) 97.2 F (36.2 C)    Ht 5' 5.5" (1.664 m)    Wt 171 lb 3.2 oz (77.7 kg)    SpO2 97%    BMI 28.06 kg/m     Wt Readings from Last 3 Encounters:  08/19/19 171 lb 3.2 oz (77.7 kg)  08/06/19 173 lb 8 oz (78.7 kg)  07/23/19 170 lb 8 oz (77.3 kg)     GEN: Patient is in no acute distress HEENT: Normal NECK: No JVD; No carotid bruits LYMPHATICS: No lymphadenopathy CARDIAC: Hear sounds regular, 2/6 systolic murmur at the apex and at the aortic area.. RESPIRATORY:  Clear to auscultation without rales, wheezing or rhonchi  ABDOMEN: Soft, non-tender, non-distended MUSCULOSKELETAL:  No edema; No deformity  SKIN: Warm and dry NEUROLOGIC:  Alert and oriented x 3 PSYCHIATRIC:  Normal affect   Signed, Deborah Lindau, MD  08/19/2019 10:43 AM    [place]

## 2019-08-20 ENCOUNTER — Telehealth: Payer: Self-pay | Admitting: Gastroenterology

## 2019-08-20 MED ORDER — ONDANSETRON 4 MG PO TBDP: 4.0000 mg | ORAL_TABLET | Freq: Three times a day (TID) | ORAL | 1 refills | Status: DC | PRN

## 2019-08-20 NOTE — Telephone Encounter (Signed)
Called and spoke with patient- patient advised of number to radiology scheduling [phone] to call and request for her gastric emptying study be rescheduled to a sooner date/time;  Patient advised to call back to the office at [phone] should questions/concerns arise;  Patient verbalized understanding of information/instructions;

## 2019-08-20 NOTE — Telephone Encounter (Signed)
Deborah Hutchinson,  Pt is scheduled for 08/28/2019 and wants to know if her scan be scheduled sooner since her insurance does not require a prior auth.  Pt is requesting a call back from the nurse.  Thanks!

## 2019-08-20 NOTE — Telephone Encounter (Signed)
Sent prescription to patients pharmacy.  

## 2019-08-21 ENCOUNTER — Telehealth: Payer: Self-pay | Admitting: Emergency Medicine

## 2019-08-21 DIAGNOSIS — I10 Essential (primary) hypertension: Secondary | ICD-10-CM

## 2019-08-21 NOTE — Telephone Encounter (Signed)
Patient informed of results and advised to stay hydrated and have labs redrawn in 1 month. She verbally understood.

## 2019-08-22 ENCOUNTER — Telehealth: Payer: Self-pay | Admitting: Gastroenterology

## 2019-08-22 MED ORDER — PANTOPRAZOLE SODIUM 40 MG PO TBEC: 40.0000 mg | DELAYED_RELEASE_TABLET | Freq: Two times a day (BID) | ORAL | 6 refills | Status: DC

## 2019-08-22 NOTE — Telephone Encounter (Signed)
I have sent prescription to pharmacy.  

## 2019-08-24 DIAGNOSIS — M545 Low back pain: Secondary | ICD-10-CM | POA: Diagnosis not present

## 2019-08-24 DIAGNOSIS — M4316 Spondylolisthesis, lumbar region: Secondary | ICD-10-CM | POA: Diagnosis not present

## 2019-08-28 ENCOUNTER — Ambulatory Visit (HOSPITAL_COMMUNITY)
Admission: RE | Admit: 2019-08-28 | Discharge: 2019-08-28 | Disposition: A | Discharge status: Home / Self Care | Payer: Medicare HMO | Source: Ambulatory Visit | Attending: Gastroenterology | Admitting: Gastroenterology

## 2019-08-28 ENCOUNTER — Other Ambulatory Visit: Payer: Self-pay

## 2019-08-28 DIAGNOSIS — R1310 Dysphagia, unspecified: Secondary | ICD-10-CM | POA: Diagnosis not present

## 2019-08-28 DIAGNOSIS — R1319 Other dysphagia: Secondary | ICD-10-CM

## 2019-08-28 DIAGNOSIS — K219 Gastro-esophageal reflux disease without esophagitis: Secondary | ICD-10-CM | POA: Diagnosis not present

## 2019-08-28 MED ORDER — TECHNETIUM TC 99M SULFUR COLLOID
2.1000 | Freq: Once | INTRAVENOUS | Status: AC | PRN
  Administered 2019-08-28: 2.1 via INTRAVENOUS

## 2019-08-29 ENCOUNTER — Telehealth: Payer: Self-pay | Admitting: Gastroenterology

## 2019-08-29 NOTE — Telephone Encounter (Signed)
Faxed over Gastric Emptying scan results to Dr Rica Records.

## 2019-08-30 DIAGNOSIS — M1990 Unspecified osteoarthritis, unspecified site: Secondary | ICD-10-CM | POA: Diagnosis not present

## 2019-08-30 DIAGNOSIS — Z6827 Body mass index (BMI) 27.0-27.9, adult: Secondary | ICD-10-CM | POA: Diagnosis not present

## 2019-08-30 DIAGNOSIS — Z7689 Persons encountering health services in other specified circumstances: Secondary | ICD-10-CM | POA: Diagnosis not present

## 2019-08-30 DIAGNOSIS — E1140 Type 2 diabetes mellitus with diabetic neuropathy, unspecified: Secondary | ICD-10-CM | POA: Diagnosis not present

## 2019-08-30 DIAGNOSIS — E663 Overweight: Secondary | ICD-10-CM | POA: Diagnosis not present

## 2019-09-01 DIAGNOSIS — K209 Esophagitis, unspecified without bleeding: Secondary | ICD-10-CM | POA: Diagnosis not present

## 2019-09-02 ENCOUNTER — Telehealth: Payer: Self-pay | Admitting: Gastroenterology

## 2019-09-02 DIAGNOSIS — K2100 Gastro-esophageal reflux disease with esophagitis, without bleeding: Secondary | ICD-10-CM | POA: Diagnosis not present

## 2019-09-02 DIAGNOSIS — K449 Diaphragmatic hernia without obstruction or gangrene: Secondary | ICD-10-CM | POA: Diagnosis not present

## 2019-09-02 DIAGNOSIS — E876 Hypokalemia: Secondary | ICD-10-CM | POA: Diagnosis not present

## 2019-09-02 DIAGNOSIS — R2689 Other abnormalities of gait and mobility: Secondary | ICD-10-CM | POA: Diagnosis not present

## 2019-09-02 DIAGNOSIS — E7800 Pure hypercholesterolemia, unspecified: Secondary | ICD-10-CM | POA: Diagnosis not present

## 2019-09-02 DIAGNOSIS — N179 Acute kidney failure, unspecified: Secondary | ICD-10-CM | POA: Diagnosis not present

## 2019-09-02 DIAGNOSIS — M545 Low back pain: Secondary | ICD-10-CM | POA: Diagnosis not present

## 2019-09-02 DIAGNOSIS — R1310 Dysphagia, unspecified: Secondary | ICD-10-CM | POA: Diagnosis not present

## 2019-09-02 DIAGNOSIS — R1319 Other dysphagia: Secondary | ICD-10-CM

## 2019-09-02 DIAGNOSIS — G8929 Other chronic pain: Secondary | ICD-10-CM | POA: Diagnosis not present

## 2019-09-02 DIAGNOSIS — I10 Essential (primary) hypertension: Secondary | ICD-10-CM | POA: Diagnosis not present

## 2019-09-02 DIAGNOSIS — R112 Nausea with vomiting, unspecified: Secondary | ICD-10-CM | POA: Diagnosis not present

## 2019-09-02 HISTORY — DX: Other dysphagia: R13.19

## 2019-09-02 NOTE — Telephone Encounter (Signed)
Patient says that she has increased acid reflux and when she eats she vomits sometimes. Patient says that the zofran is not helping.

## 2019-09-02 NOTE — Telephone Encounter (Signed)
I have called and spoke to patients daughter and advised her that patient needs to go to the ER if patient is vomiting blood.

## 2019-09-03 DIAGNOSIS — E119 Type 2 diabetes mellitus without complications: Secondary | ICD-10-CM | POA: Diagnosis not present

## 2019-09-03 DIAGNOSIS — I10 Essential (primary) hypertension: Secondary | ICD-10-CM | POA: Diagnosis not present

## 2019-09-03 DIAGNOSIS — K208 Other esophagitis without bleeding: Secondary | ICD-10-CM | POA: Diagnosis not present

## 2019-09-03 DIAGNOSIS — K3184 Gastroparesis: Secondary | ICD-10-CM | POA: Diagnosis not present

## 2019-09-03 DIAGNOSIS — E1143 Type 2 diabetes mellitus with diabetic autonomic (poly)neuropathy: Secondary | ICD-10-CM | POA: Diagnosis not present

## 2019-09-03 DIAGNOSIS — K3189 Other diseases of stomach and duodenum: Secondary | ICD-10-CM | POA: Diagnosis not present

## 2019-09-03 DIAGNOSIS — R112 Nausea with vomiting, unspecified: Secondary | ICD-10-CM | POA: Diagnosis not present

## 2019-09-03 DIAGNOSIS — R1310 Dysphagia, unspecified: Secondary | ICD-10-CM | POA: Diagnosis not present

## 2019-09-03 DIAGNOSIS — K449 Diaphragmatic hernia without obstruction or gangrene: Secondary | ICD-10-CM | POA: Diagnosis not present

## 2019-09-03 DIAGNOSIS — R1110 Vomiting, unspecified: Secondary | ICD-10-CM | POA: Diagnosis not present

## 2019-09-03 DIAGNOSIS — K317 Polyp of stomach and duodenum: Secondary | ICD-10-CM | POA: Diagnosis not present

## 2019-09-03 DIAGNOSIS — N179 Acute kidney failure, unspecified: Secondary | ICD-10-CM | POA: Diagnosis not present

## 2019-09-03 HISTORY — PX: ESOPHAGOGASTRODUODENOSCOPY: SHX1529

## 2019-09-03 NOTE — Telephone Encounter (Signed)
If she is having significant hematemesis, she needs to go to ED  RG

## 2019-09-04 DIAGNOSIS — K3184 Gastroparesis: Secondary | ICD-10-CM

## 2019-09-04 DIAGNOSIS — G8929 Other chronic pain: Secondary | ICD-10-CM | POA: Diagnosis not present

## 2019-09-04 DIAGNOSIS — R109 Unspecified abdominal pain: Secondary | ICD-10-CM | POA: Diagnosis not present

## 2019-09-04 DIAGNOSIS — K449 Diaphragmatic hernia without obstruction or gangrene: Secondary | ICD-10-CM | POA: Diagnosis not present

## 2019-09-04 DIAGNOSIS — R112 Nausea with vomiting, unspecified: Secondary | ICD-10-CM | POA: Diagnosis not present

## 2019-09-04 DIAGNOSIS — E1143 Type 2 diabetes mellitus with diabetic autonomic (poly)neuropathy: Secondary | ICD-10-CM | POA: Diagnosis not present

## 2019-09-04 DIAGNOSIS — K208 Other esophagitis without bleeding: Secondary | ICD-10-CM | POA: Diagnosis not present

## 2019-09-04 DIAGNOSIS — R1310 Dysphagia, unspecified: Secondary | ICD-10-CM | POA: Diagnosis not present

## 2019-09-04 DIAGNOSIS — K317 Polyp of stomach and duodenum: Secondary | ICD-10-CM | POA: Diagnosis not present

## 2019-09-04 HISTORY — DX: Gastroparesis: K31.84

## 2019-09-05 DIAGNOSIS — M7138 Other bursal cyst, other site: Secondary | ICD-10-CM | POA: Diagnosis not present

## 2019-09-05 DIAGNOSIS — M48061 Spinal stenosis, lumbar region without neurogenic claudication: Secondary | ICD-10-CM | POA: Diagnosis not present

## 2019-09-05 DIAGNOSIS — M4316 Spondylolisthesis, lumbar region: Secondary | ICD-10-CM | POA: Diagnosis not present

## 2019-09-07 ENCOUNTER — Other Ambulatory Visit: Payer: Self-pay | Admitting: Cardiology

## 2019-09-09 ENCOUNTER — Other Ambulatory Visit: Payer: Self-pay

## 2019-09-09 MED ORDER — METOCLOPRAMIDE HCL 10 MG PO TABS: ORAL_TABLET | ORAL | 0 refills | Status: DC

## 2019-09-11 DIAGNOSIS — R1110 Vomiting, unspecified: Secondary | ICD-10-CM | POA: Diagnosis not present

## 2019-09-11 DIAGNOSIS — Z6827 Body mass index (BMI) 27.0-27.9, adult: Secondary | ICD-10-CM | POA: Diagnosis not present

## 2019-09-11 DIAGNOSIS — K219 Gastro-esophageal reflux disease without esophagitis: Secondary | ICD-10-CM | POA: Diagnosis not present

## 2019-09-11 DIAGNOSIS — E663 Overweight: Secondary | ICD-10-CM | POA: Diagnosis not present

## 2019-09-11 DIAGNOSIS — Z09 Encounter for follow-up examination after completed treatment for conditions other than malignant neoplasm: Secondary | ICD-10-CM | POA: Diagnosis not present

## 2019-09-17 ENCOUNTER — Other Ambulatory Visit: Payer: Self-pay | Admitting: Neurology

## 2019-09-17 DIAGNOSIS — H9313 Tinnitus, bilateral: Secondary | ICD-10-CM

## 2019-09-17 DIAGNOSIS — R519 Headache, unspecified: Secondary | ICD-10-CM

## 2019-09-17 MED ORDER — CLOPIDOGREL BISULFATE 75 MG PO TABS: 75.0000 mg | ORAL_TABLET | Freq: Every day | ORAL | 0 refills | Status: DC

## 2019-09-19 ENCOUNTER — Telehealth: Payer: Self-pay | Admitting: Gastroenterology

## 2019-09-19 NOTE — Telephone Encounter (Signed)
Patient scheduled for follow up on 10/11/19.

## 2019-09-19 NOTE — Telephone Encounter (Signed)
Pt stated that she was at the [facility] Ed on 4/26 for swallowing issues. She wants to know if Dr. Lyndel Safe would like to see her as a f/u. Pls call her.

## 2019-09-23 DIAGNOSIS — M545 Low back pain: Secondary | ICD-10-CM | POA: Diagnosis not present

## 2019-09-23 DIAGNOSIS — M4316 Spondylolisthesis, lumbar region: Secondary | ICD-10-CM | POA: Diagnosis not present

## 2019-09-24 DIAGNOSIS — K140 Glossitis: Secondary | ICD-10-CM | POA: Diagnosis not present

## 2019-09-24 DIAGNOSIS — K120 Recurrent oral aphthae: Secondary | ICD-10-CM | POA: Diagnosis not present

## 2019-09-24 DIAGNOSIS — E663 Overweight: Secondary | ICD-10-CM | POA: Diagnosis not present

## 2019-09-24 DIAGNOSIS — K219 Gastro-esophageal reflux disease without esophagitis: Secondary | ICD-10-CM | POA: Diagnosis not present

## 2019-09-24 DIAGNOSIS — E559 Vitamin D deficiency, unspecified: Secondary | ICD-10-CM | POA: Diagnosis not present

## 2019-09-24 DIAGNOSIS — Z6827 Body mass index (BMI) 27.0-27.9, adult: Secondary | ICD-10-CM | POA: Diagnosis not present

## 2019-09-24 DIAGNOSIS — E1140 Type 2 diabetes mellitus with diabetic neuropathy, unspecified: Secondary | ICD-10-CM | POA: Diagnosis not present

## 2019-09-24 DIAGNOSIS — Z79899 Other long term (current) drug therapy: Secondary | ICD-10-CM | POA: Diagnosis not present

## 2019-09-24 DIAGNOSIS — R21 Rash and other nonspecific skin eruption: Secondary | ICD-10-CM | POA: Diagnosis not present

## 2019-09-25 DIAGNOSIS — N952 Postmenopausal atrophic vaginitis: Secondary | ICD-10-CM | POA: Diagnosis not present

## 2019-09-25 DIAGNOSIS — R32 Unspecified urinary incontinence: Secondary | ICD-10-CM | POA: Diagnosis not present

## 2019-10-01 DIAGNOSIS — S8992XA Unspecified injury of left lower leg, initial encounter: Secondary | ICD-10-CM | POA: Diagnosis not present

## 2019-10-01 DIAGNOSIS — E860 Dehydration: Secondary | ICD-10-CM | POA: Diagnosis not present

## 2019-10-01 DIAGNOSIS — S8392XA Sprain of unspecified site of left knee, initial encounter: Secondary | ICD-10-CM | POA: Diagnosis not present

## 2019-10-01 DIAGNOSIS — M25462 Effusion, left knee: Secondary | ICD-10-CM | POA: Diagnosis not present

## 2019-10-01 DIAGNOSIS — S80212A Abrasion, left knee, initial encounter: Secondary | ICD-10-CM | POA: Diagnosis not present

## 2019-10-01 DIAGNOSIS — M1712 Unilateral primary osteoarthritis, left knee: Secondary | ICD-10-CM | POA: Diagnosis not present

## 2019-10-01 DIAGNOSIS — I951 Orthostatic hypotension: Secondary | ICD-10-CM | POA: Diagnosis not present

## 2019-10-08 DIAGNOSIS — M25562 Pain in left knee: Secondary | ICD-10-CM | POA: Diagnosis not present

## 2019-10-08 DIAGNOSIS — R296 Repeated falls: Secondary | ICD-10-CM | POA: Diagnosis not present

## 2019-10-08 DIAGNOSIS — M545 Low back pain: Secondary | ICD-10-CM | POA: Diagnosis not present

## 2019-10-08 DIAGNOSIS — Z09 Encounter for follow-up examination after completed treatment for conditions other than malignant neoplasm: Secondary | ICD-10-CM | POA: Diagnosis not present

## 2019-10-08 DIAGNOSIS — Z6827 Body mass index (BMI) 27.0-27.9, adult: Secondary | ICD-10-CM | POA: Diagnosis not present

## 2019-10-08 DIAGNOSIS — E663 Overweight: Secondary | ICD-10-CM | POA: Diagnosis not present

## 2019-10-09 DIAGNOSIS — D473 Essential (hemorrhagic) thrombocythemia: Secondary | ICD-10-CM | POA: Diagnosis not present

## 2019-10-09 DIAGNOSIS — D509 Iron deficiency anemia, unspecified: Secondary | ICD-10-CM | POA: Diagnosis not present

## 2019-10-10 DIAGNOSIS — M4186 Other forms of scoliosis, lumbar region: Secondary | ICD-10-CM | POA: Diagnosis not present

## 2019-10-10 DIAGNOSIS — M48062 Spinal stenosis, lumbar region with neurogenic claudication: Secondary | ICD-10-CM | POA: Diagnosis not present

## 2019-10-10 DIAGNOSIS — M4726 Other spondylosis with radiculopathy, lumbar region: Secondary | ICD-10-CM | POA: Diagnosis not present

## 2019-10-10 DIAGNOSIS — M4316 Spondylolisthesis, lumbar region: Secondary | ICD-10-CM | POA: Diagnosis not present

## 2019-10-11 ENCOUNTER — Encounter: Payer: Self-pay | Admitting: Gastroenterology

## 2019-10-11 ENCOUNTER — Ambulatory Visit (INDEPENDENT_AMBULATORY_CARE_PROVIDER_SITE_OTHER): Payer: Medicare HMO | Admitting: Gastroenterology

## 2019-10-11 VITALS — BP 128/74 | HR 83 | Temp 96.9°F | Ht 64.0 in | Wt 165.5 lb

## 2019-10-11 DIAGNOSIS — K449 Diaphragmatic hernia without obstruction or gangrene: Secondary | ICD-10-CM | POA: Diagnosis not present

## 2019-10-11 DIAGNOSIS — K3184 Gastroparesis: Secondary | ICD-10-CM

## 2019-10-11 DIAGNOSIS — R112 Nausea with vomiting, unspecified: Secondary | ICD-10-CM | POA: Diagnosis not present

## 2019-10-11 DIAGNOSIS — K219 Gastro-esophageal reflux disease without esophagitis: Secondary | ICD-10-CM | POA: Diagnosis not present

## 2019-10-11 MED ORDER — METOCLOPRAMIDE HCL 10 MG PO TABS: 10.0000 mg | ORAL_TABLET | Freq: Four times a day (QID) | ORAL | 4 refills | Status: DC

## 2019-10-11 NOTE — Progress Notes (Signed)
IMPRESSION and PLAN:    #1. GERD with large HH recurrent N/V/dysphagia. S/P EGD 08/2019, 12/2018 with dil (50Fr) without any significant benefit.  Has been treated for Candida esophagitis 08/2019 without benefit. UGI 04/2019- HH, GERD, no stricture.  #2. 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  Not enough to explain her symptoms.  #3. H/O TAs (s/p colon 12/2018. Next due 12/2021).  #4. Wt Loss  Plan: - Continue Protonix 40mg  po bid, Famotidine 40mg  po qhs for now.  - Gastroparesis diet - Would get opinion from Dr Derrill Kay for gastroparesis, possibly EGG (if indicated) - Would also set her up at [facility] for Eso manometry.  - Surgical opinion by Dr Demetrio Lapping. (for [facility] repair/gastropexy). I doubt if she is having intermittent gastric volvulus.  But, certainly a possibility. - Raise HOB (bricks) 30 to 45 degrees. - Continue Reglan 10mg  po QID 1/2hr QAC, QHS #120, 4 refills - Small but frequent foods. - FU in 12 weeks.  Earlier, if still with problems.  She will call us in 2 weeks and let us know how she is doing.  Discussed extensively with patient and patient's daughter.      HPI:    Chief Complaint:   Deborah Hutchinson is a 74 y.o. female with CAD on plavix, DM2, HH, OSA, COPD, LBP d/t OA (awaiting back surgery at [facility]), HTN, HLD For follow-up visit Accompanied by her daughter  Recent hospitalization 4/26-4/28/2021 at [facility] with N/V/dysphagia. EGD 4/27 -Candida esophagitis, no strictures.  Large hiatal hernia.  Treated with Diflucan 400 (day1), then 200mg  x 2 weeks.   Continues to have similar problems with dysphagia mostly to pills, early satiety, intermittent nausea/vomiting, which at times is severe. N/V is episodic and would at times wake her up from sleep.  She could not identify any definite triggering factors.  This time is associated with weight loss of 8lb over last 2 months as detailed below.  We have gone over upper GI series from 04/26/2019 in detail including  films.  It shows moderate-large hiatal hernia, severe gastroesophageal reflux.  No stricture.  Barium tablet passed without any problems.  Her gastric emptying scan 08/29/2019 showed delayed gastric emptying (12% emptied at hr 1, 29% at hr 2, 47% at hr 3, 64% an hr 4).  She has done somewhat better with the Reglan.  Deborah Hutchinson is quite distressed with her symptoms and is seriously thinking about surgery.   No melena, hematochezia or hematemesis.  No diarrhea or constipation  No fever or chills.  Denies having any melena or hematochezia  Having back Sx at [facility] next week.  No sodas, chocolates, chewing gums, artificial sweeteners and candy. No NSAIDs    Wt Readings from Last 3 Encounters:  10/11/19 165 lb 8 oz (75.1 kg)  08/19/19 171 lb 3.2 oz (77.7 kg)  08/06/19 173 lb 8 oz (78.7 kg)     Past GI procedures: -EGD 08/2019: Candida esophagitis, no stricture, 7 cm hiatal hernia.  Treated empirically for Candida esophagitis  EGD 12/14/2018: Large HH, hyperplastic gastric polyps, eso stricture s/p dil 50 Fr. EGD 08/17/2015 mod HH, neg SB Bx for celiac. -UGI series 04/2019: HH, GERD, no strictures.  Ba tab passed without any problems. -Colonoscopy 12/14/2018-colonic polyps s/p polypectomy, mild pancolonic div. Bx- TA. -CT AP with contrast 02/09/2018: Neg, Stable left adrenal adenoma, small right inguinal hernia. -GES 08/2019: delayed gastric emptying (12% emptied at hr 1, 29% at hr 2, 47% at hr  3, 64% an hr 4).  LHC 05/08/2019:  Prox RCA lesion is 50% stenosed.  Prox LAD lesion is 40% stenosed.  The left ventricular systolic function is normal.  LV end diastolic pressure is normal.  The left ventricular ejection fraction is greater than 65% by visual estimate.  There is no mitral valve regurgitation. Past Medical History:  Diagnosis Date  . Angina    normal stress test  . Anxiety   . Arthritis   . Asthma   . Bronchitis   . CAD (coronary artery disease) 02/13/2019  .  Chest pain 05/08/2019  . Chest pain of uncertain etiology   . Chronic bilateral low back pain without sciatica 07/11/2019   Formatting of this note might be different from the original. Added automatically from request for surgery [phone]  . COPD (chronic obstructive pulmonary disease) ([place]) 02/09/2017  . 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 Diabetes mellitus   . Diabetes mellitus due to underlying condition with unspecified complications ([place]) 08/15/7024  . Dyslipidemia 06/10/2015  . Essential hypertension 06/10/2015  . GERD (gastroesophageal reflux disease)   . H/O hiatal hernia   . Headache 09/24/2018  . Heart murmur   . Hyperlipemia   . Hypertension   . Mixed dyslipidemia 06/10/2015  . Moderate aortic stenosis 05/16/2019  . Nonspecific (abnormal) findings on radiological and other examination of gastrointestinal tract 05/19/2011  . OSA (obstructive sleep apnea) 02/09/2017  . Pain and swelling of toe of right foot 07/15/2016  . Palpitations 06/02/2016  . Shortness of breath    on  excertion  . Sleep apnea     Current Outpatient Medications  Medication Sig Dispense Refill  . albuterol (PROVENTIL HFA;VENTOLIN HFA) 108 (90 Base) MCG/ACT inhaler Inhale 2 puffs into the lungs every 6 (six) hours as needed for wheezing or shortness of breath. 3 Inhaler 3  . amLODipine (NORVASC) 10 MG tablet Take 10 mg by mouth daily.    . budesonide-formoterol (SYMBICORT) 160-4.5 MCG/ACT inhaler Inhale 2 puffs into the lungs 2 (two) times daily. 3 Inhaler 3  . buPROPion (WELLBUTRIN XL) 300 MG 24 hr tablet Take 300 mg by mouth daily.     . Cholecalciferol (VITAMIN D PO) Take 5,000 Units by mouth as directed. Every 2 weeks    . clopidogrel (PLAVIX) 75 MG tablet Take 1 tablet (75 mg total) by mouth daily. 30 tablet 11  . dicyclomine (BENTYL) 20 MG tablet Take 20 mg by mouth every 6 (six) hours as needed.     .      . escitalopram (LEXAPRO) 10 MG  tablet Take 10 mg by mouth daily.    . famotidine (PEPCID) 40 MG tablet Take 1 tablet (40 mg total) by mouth at bedtime. 180 tablet 3  . fluticasone (VERAMYST) 27.5 MCG/SPRAY nasal spray Place 2 sprays into the nose daily.    Marland Kitchen gabapentin (NEURONTIN) 300 MG capsule Take 300 mg by mouth 3 (three) times daily as needed.     . hydrALAZINE (APRESOLINE) 50 MG tablet Take 1 tablet (50 mg total) by mouth 3 (three) times daily. 180 tablet 2  . hydrochlorothiazide (HYDRODIURIL) 25 MG tablet Take 1 tablet (25 mg total) by mouth daily. 60 tablet 2  . Lancets (ACCU-CHEK MULTICLIX) lancets     . linaclotide (LINZESS) 72 MCG capsule Take 72 mcg by mouth as needed.    Marland Kitchen losartan (COZAAR) 100 MG tablet Take 1 tablet (100 mg total) by mouth daily.  60 tablet 2  . metFORMIN (GLUCOPHAGE) 500 MG tablet Take 500 mg by mouth 2 (two) times daily with a meal.     . montelukast (SINGULAIR) 10 MG tablet Take 10 mg by mouth at bedtime.     . Multiple Vitamin (MULTIVITAMIN) capsule Take 1 capsule by mouth daily.      Marland Kitchen oxybutynin (DITROPAN-XL) 10 MG 24 hr tablet Take 10 mg by mouth daily.    . pantoprazole (PROTONIX) 40 MG tablet Take 1 tablet (40 mg total) by mouth daily. 60 tablet 2  . Potassium Chloride CR (MICRO-K) 8 MEQ CPCR capsule CR TAKE 1 CAPSULE BY MOUTH ONCE DAILY FOR LOW POTASSIUM    . simvastatin (ZOCOR) 20 MG tablet Take 20 mg by mouth at bedtime.    Marland Kitchen spironolactone (ALDACTONE) 25 MG tablet Take 1 tablet (25 mg total) by mouth daily. 60 tablet 2  . traMADol (ULTRAM) 50 MG tablet Take 50 mg by mouth 2 (two) times daily as needed for pain.    Marland Kitchen VICTOZA 18 MG/3ML SOPN 12 mg daily.     . BD PEN NEEDLE NANO U/F 32G X 4 MM MISC     . isosorbide mononitrate (IMDUR) 120 MG 24 hr tablet Take 1 tablet (120 mg total) by mouth daily. (Patient not taking: Reported on 06/10/2019) 60 tablet 2  . nitroGLYCERIN (NITROSTAT) 0.4 MG SL tablet DISSOLVE ONE TABLET UNDER THE TONGUE EVERY 5 MINUTES AS NEEDED FOR CHEST PAIN.  DO NOT  EXCEED A TOTAL OF 3 DOSES IN 15 MINUTES (Patient not taking: Reported on 06/10/2019) 25 tablet 5   No current facility-administered medications for this visit.    Past Surgical History:  Procedure Laterality Date  . ABDOMINAL HYSTERECTOMY    . ABDOMINAL HYSTERECTOMY    . BREAST SURGERY     begin mass,left  breast  . COLONOSCOPY  08/17/2015   Colonic polyp status post polypectomy. Mild pancolonic diverticulosis. Highly redundant colon.   Marland Kitchen DILATION AND CURETTAGE OF UTERUS     one  . ESOPHAGOGASTRODUODENOSCOPY  08/17/2015   Moderate hiatal hernia. Otherwise noraml EGD.   Marland Kitchen ESOPHAGOGASTRODUODENOSCOPY  09/03/2019   [place]  . EUS  05/19/2011   Procedure: UPPER ENDOSCOPIC ULTRASOUND (EUS) LINEAR;  Surgeon: Owens Loffler, MD;  Location: [facility];  Service: Endoscopy;  Laterality: N/A;  radial linear   . HYSTEROTOMY    . KNEE SURGERY    . LEFT HEART CATH AND CORONARY ANGIOGRAPHY    . LEFT HEART CATH AND CORONARY ANGIOGRAPHY N/A 05/08/2019   Procedure: LEFT HEART CATH AND CORONARY ANGIOGRAPHY;  Surgeon: Burnell Blanks, MD;  Location: [facility];  Service: Cardiovascular;  Laterality: N/A;  . right thumb     tendon repaire  . SHOULDER SURGERY     rotator cuff repair  . WRIST SURGERY     rigth    Family History  Problem Relation Age of Onset  . Diabetes Sister   . Hypertension Daughter   . Colon cancer Neg Hx     Social History   Tobacco Use  . Smoking status: Former Smoker    Packs/day: 1.00    Quit date: 05/17/2011    Years since quitting: 8.4  . Smokeless tobacco: Never Used  Substance Use Topics  . Alcohol use: No  . Drug use: No    Allergies  Allergen Reactions  . Penicillins Rash    Reaction was 30years ago  Has never taken again  . Gabapentin  Hullination    . Tramadol     Hallunications      Review of Systems: All systems reviewed and negative except where noted in HPI.    Physical Exam:     BP 128/74    Pulse 83   Temp (!) 96.9 F (36.1 C)   Ht 5\' 4"  (1.626 m)   Wt 165 lb 8 oz (75.1 kg)   BMI 28.41 kg/m   Gen: awake, alert, NAD HEENT: anicteric, no pallor CV: RRR, no mrg Pulm: CTA b/l Abd: soft, NT/ND, +BS throughout Ext: no c/c/e Neuro: nonfocal      Cristhian Vanhook,MD 10/11/2019, 3:05 PM

## 2019-10-11 NOTE — Patient Instructions (Signed)
We have sent the following medications to your pharmacy for you to pick up at your convenience: [place] with Dr Derrill Kay for gastroparesis  We will contact you for an appointment for Esophageal Manometry at [facility]  Referral to [facility] with Dr Leonidas Romberg for [facility] repair    Thank you,  Dr. Jackquline Denmark

## 2019-10-15 NOTE — Progress Notes (Addendum)
NEUROLOGY FOLLOW UP OFFICE NOTE  Deborah Hutchinson [id number]  HISTORY OF PRESENT ILLNESS: Deborah Hutchinson is a 74 year old woman with iron-deficiency anemia, type 2 diabetes mellitus, CAD, hypertension who follows up for headache.  She is accompanied by her daughter who supplements history.  UPDATE: Last seen in June 2020. 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  No aneurysm.  Due to the severe right cavernous carotid stenosis,, ASA was switched to Plavix, which was first discussed with her cardiologist, Dr. Geraldo Pitter.   Headaches occur every now and then when her bronchitis acts up.  They improved after she was taken off of metformin.  They are now mild.    08/19/2019 LDL t. Chol 199, TG 73, HDL 89, LDL 97  Current NSAIDS:  none Current analgesics:  tramadol Current triptans:  none Current ergotamine:  none Current anti-emetic:  none Current muscle relaxants:  none Current anti-anxiolytic:  none Current sleep aide:  none Current Antihypertensive medications:  Imdur, hydralazine, Norvasc Current Antidepressant medications:  Lexapro 10mg , Wellbutrin Current Anticonvulsant medications:  Gabapentin 300mg  three times daily Current anti-CGRP:  none Current Vitamins/Herbal/Supplements:  none Current Antihistamines/Decongestants:  none Other therapy:  none Hormone/birth control:  none  HISTORY: She began having headaches in 2017-2018.  They only occur when she has episodes of dyspnea.  She suddenly feels short of breath and lightheaded but doesn't pass out.  It is not exertional and happens at rest.  It does not occur out of sleep.  She has asthma and chronic bronchitis.  When these episodes occur, she develops 10/10 right sided stabbing headache.  She has associated weakness but denies associated neck pain, nausea, vomiting, photophobia,  phonophobia, visual disturbance or unilateral numbness or weakness.  It typically lasts 5 minutes, about as long as the shortness of breath until she uses her inhaler.  Frequency varies.  She may have a week without episodes or twice a week or some weeks it may occur several times.  She also has longstanding history of tinnitus, which she describes as sounding like "bees" in her head.  She was evaluated by ENT in April 2019.  Hearing test and tympanogram were unremarkable.  When she gets these headaches, the ringing sounds louder.  She denies prior history of headaches.   PAST MEDICAL HISTORY: Past Medical History:  Diagnosis Date  . Angina    normal stress test  . Anxiety   . Arthritis   . Asthma   . Bronchitis   . CAD (coronary artery disease) 02/13/2019  . Chest pain 05/08/2019  . Chest pain of uncertain etiology   . Chronic bilateral low back pain without sciatica 07/11/2019   Formatting of this note might be different from the original. Added automatically from request for surgery [phone]  . COPD (chronic obstructive pulmonary disease) ([place]) 02/09/2017  . 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 Diabetes mellitus   . Diabetes mellitus due to underlying condition with unspecified complications ([place]) 05/16/8414  . Dyslipidemia 06/10/2015  . Essential hypertension 06/10/2015  . GERD (gastroesophageal reflux disease)   . H/O hiatal hernia   . Headache 09/24/2018  . Heart murmur   . Hyperlipemia   . Hypertension   . Mixed dyslipidemia 06/10/2015  . Moderate aortic stenosis 05/16/2019  . Nonspecific (abnormal) findings on radiological and other examination of  gastrointestinal tract 05/19/2011  . OSA (obstructive sleep apnea) 02/09/2017  . Pain and swelling of toe of right foot 07/15/2016  . Palpitations 06/02/2016  . Shortness of breath    on  excertion  . Sleep apnea     MEDICATIONS: Current Outpatient Medications on File Prior  to Visit  Medication Sig Dispense Refill  . albuterol (PROVENTIL HFA;VENTOLIN HFA) 108 (90 Base) MCG/ACT inhaler Inhale 2 puffs into the lungs every 6 (six) hours as needed for wheezing or shortness of breath. 3 Inhaler 3  . amLODipine (NORVASC) 10 MG tablet Take 10 mg by mouth daily.    . BD PEN NEEDLE NANO U/F 32G X 4 MM MISC     . budesonide-formoterol (SYMBICORT) 160-4.5 MCG/ACT inhaler Inhale 2 puffs into the lungs 2 (two) times daily. 3 Inhaler 3  . buPROPion (WELLBUTRIN XL) 300 MG 24 hr tablet Take 300 mg by mouth daily.     . celecoxib (CELEBREX) 100 MG capsule     . cetirizine (ZYRTEC) 10 MG tablet Take 1 tablet by mouth at bedtime.    . Cholecalciferol (VITAMIN D PO) Take 5,000 Units by mouth every 30 (thirty) days.     . clopidogrel (PLAVIX) 75 MG tablet Take 1 tablet (75 mg total) by mouth daily. 90 tablet 0  . dicyclomine (BENTYL) 20 MG tablet Take 20 mg by mouth every 6 (six) hours as needed.     Marland Kitchen escitalopram (LEXAPRO) 10 MG tablet Take 10 mg by mouth daily.    Marland Kitchen estradiol (ESTRACE) 0.1 MG/GM vaginal cream 1 Applicatorful as needed. 2 times weekly    . famotidine (PEPCID) 40 MG tablet Take 1 tablet (40 mg total) by mouth at bedtime. 30 tablet 6  . fluticasone (VERAMYST) 27.5 MCG/SPRAY nasal spray Place 2 sprays into the nose daily.    . hydrALAZINE (APRESOLINE) 50 MG tablet Take 1 tablet (50 mg total) by mouth 3 (three) times daily. 180 tablet 2  . hydrochlorothiazide (HYDRODIURIL) 25 MG tablet TAKE 1 TABLET EVERY DAY 90 tablet 3  . INVOKANA 300 MG TABS tablet 1 tablet daily.    . isosorbide mononitrate (IMDUR) 120 MG 24 hr tablet Take 1 tablet (120 mg total) by mouth daily. 60 tablet 2  . Lancets (ACCU-CHEK MULTICLIX) lancets     . linaclotide (LINZESS) 72 MCG capsule Take 72 mcg by mouth as needed.    Marland Kitchen losartan (COZAAR) 100 MG tablet Take 1 tablet (100 mg total) by mouth daily. 60 tablet 2  . metoCLOPramide (REGLAN) 10 MG tablet Take one tablet by mouth half an hour before  meals x 2 weeks. (Patient taking differently: 20 mg QID. Take one tablet by mouth half an hour before meals x 2 weeks.) 42 tablet 0  . metoCLOPramide (REGLAN) 10 MG tablet Take 1 tablet (10 mg total) by mouth 4 (four) times daily. 120 tablet 4  . montelukast (SINGULAIR) 10 MG tablet Take 10 mg by mouth at bedtime.     . Multiple Vitamin (MULTIVITAMIN) capsule Take 1 capsule by mouth daily.      . nitroGLYCERIN (NITROSTAT) 0.4 MG SL tablet DISSOLVE ONE TABLET UNDER THE TONGUE EVERY 5 MINUTES AS NEEDED FOR CHEST PAIN.  DO NOT EXCEED A TOTAL OF 3 DOSES IN 15 MINUTES 25 tablet 5  . pantoprazole (PROTONIX) 40 MG tablet Take 1 tablet (40 mg total) by mouth 2 (two) times daily. 60 tablet 6  . Potassium Chloride CR (MICRO-K) 8 MEQ CPCR capsule CR TAKE 1 CAPSULE BY  MOUTH ONCE DAILY FOR LOW POTASSIUM    . simvastatin (ZOCOR) 20 MG tablet Take 20 mg by mouth at bedtime.    Marland Kitchen spironolactone (ALDACTONE) 25 MG tablet Take 1 tablet (25 mg total) by mouth daily. 60 tablet 2  . VICTOZA 18 MG/3ML SOPN 12 mg daily.      No current facility-administered medications on file prior to visit.    ALLERGIES: Allergies  Allergen Reactions  . Penicillins Rash    Reaction was 30years ago  Has never taken again  . Gabapentin     Hullination    . Tramadol     Hallunications     FAMILY HISTORY: Family History  Problem Relation Age of Onset  . Diabetes Sister   . Hypertension Daughter   . Colon cancer Neg Hx     SOCIAL HISTORY: Social History   Socioeconomic History  . Marital status: Single    Spouse name: Not on file  . Number of children: 3  . Years of education: Not on file  . Highest education level: Not on file  Occupational History  . Occupation: retired  Tobacco Use  . Smoking status: Former Smoker    Packs/day: 1.00    Quit date: 05/17/2011    Years since quitting: 8.4  . Smokeless tobacco: Never Used  Substance and Sexual Activity  . Alcohol use: No  . Drug use: No  . Sexual activity:  Never  Other Topics Concern  . Not on file  Social History Narrative  . Not on file   Social Determinants of Health   Financial Resource Strain:   . Difficulty of Paying Living Expenses:   Food Insecurity:   . Worried About Charity fundraiser in the Last Year:   . Arboriculturist in the Last Year:   Transportation Needs:   . Film/video editor (Medical):   Marland Kitchen Lack of Transportation (Non-Medical):   Physical Activity:   . Days of Exercise per Week:   . Minutes of Exercise per Session:   Stress:   . Feeling of Stress :   Social Connections:   . Frequency of Communication with Friends and Family:   . Frequency of Social Gatherings with Friends and Family:   . Attends Religious Services:   . Active Member of Clubs or Organizations:   . Attends Archivist Meetings:   Marland Kitchen Marital Status:   Intimate Partner Violence:   . Fear of Current or Ex-Partner:   . Emotionally Abused:   Marland Kitchen Physically Abused:   . Sexually Abused:     PHYSICAL EXAM: Blood pressure 105/66, pulse 79, height 5\' 4"  (1.626 m), weight 164 lb (74.4 kg), SpO2 97 %. General: No acute distress.  Patient appears well-groomed.   Head:  Normocephalic/atraumatic Eyes:  Fundi examined but not visualized Neck: supple, no paraspinal tenderness, full range of motion Heart:  Regular rate and rhythm Lungs:  Clear to auscultation bilaterally Back: No paraspinal tenderness Neurological Exam: alert and oriented to person, place, and time. Attention span and concentration intact, recent and remote memory intact, fund of knowledge intact.  Speech fluent and not dysarthric, language intact.  CN II-XII intact. Bulk and tone normal, muscle strength 5/5 throughout.  Sensation to light touch, temperature and vibration intact.  Deep tendon reflexes 2+ throughout, toes downgoing.  Finger to nose and heel to shin testing intact.  Gait normal, Romberg negative.  IMPRESSION: 1.  Unilateral headache, controlled 2.  Intracranial  atherosclerosis with severe right  cavernous carotid stenosis 3.  Tinnitus.  Not pulsatile, so I do not think it is related to the cavernous carotid stenosis.  PLAN: 1.  Plavix 75mg  daily 2.  Continue blood pressure management and optimized management of cholesterol 3.  Follow up as needed.  ADDENDUM:  Due to asymptomatic severe right and moderate left cavernous carotid artery stenoses, advised to keep SBP goal 130-150 to ensure adequate perfusion.  Will relate this to patient as well as send recommendations to Dr. Rica Records and Dr. Geraldo Pitter.   Metta Clines, DO

## 2019-10-16 ENCOUNTER — Telehealth: Payer: Self-pay

## 2019-10-16 DIAGNOSIS — K219 Gastro-esophageal reflux disease without esophagitis: Secondary | ICD-10-CM | POA: Diagnosis not present

## 2019-10-16 DIAGNOSIS — M1990 Unspecified osteoarthritis, unspecified site: Secondary | ICD-10-CM | POA: Diagnosis not present

## 2019-10-16 DIAGNOSIS — E785 Hyperlipidemia, unspecified: Secondary | ICD-10-CM | POA: Diagnosis not present

## 2019-10-16 DIAGNOSIS — E559 Vitamin D deficiency, unspecified: Secondary | ICD-10-CM | POA: Diagnosis not present

## 2019-10-16 DIAGNOSIS — I10 Essential (primary) hypertension: Secondary | ICD-10-CM | POA: Diagnosis not present

## 2019-10-16 DIAGNOSIS — E663 Overweight: Secondary | ICD-10-CM | POA: Diagnosis not present

## 2019-10-16 DIAGNOSIS — E1140 Type 2 diabetes mellitus with diabetic neuropathy, unspecified: Secondary | ICD-10-CM | POA: Diagnosis not present

## 2019-10-16 DIAGNOSIS — Z6827 Body mass index (BMI) 27.0-27.9, adult: Secondary | ICD-10-CM | POA: Diagnosis not present

## 2019-10-16 DIAGNOSIS — M545 Low back pain: Secondary | ICD-10-CM | POA: Diagnosis not present

## 2019-10-16 NOTE — Telephone Encounter (Signed)
Letter has been sent to patient regarding this

## 2019-10-16 NOTE — Telephone Encounter (Signed)
Mailbox was full  Patient has appointment with Dr. Corliss Parish at [facility] on 10/20 at 8:30am. Address is [place]. She will be put on a waiting list and if cancellation happens then [facility] will call patient to reschedule. They know it needs to be an ASAP appointment but no availability . Their number is [phone] Fax number is [phone]  Referral/Order for manometry has been placed at [facility] scheduling. They will call patient to schedule an appointment. Their number is [phone] and fax is [phone].   Referral for Dr Garner Nash for Hiatal hernia repair has been placed with [facility]. They will call patient to schedule an appointment. Their number is [phone]  And fax is [phone] to referral coordinator

## 2019-10-17 ENCOUNTER — Ambulatory Visit (INDEPENDENT_AMBULATORY_CARE_PROVIDER_SITE_OTHER): Payer: Medicare HMO | Admitting: Neurology

## 2019-10-17 ENCOUNTER — Encounter: Payer: Self-pay | Admitting: Neurology

## 2019-10-17 ENCOUNTER — Other Ambulatory Visit: Payer: Self-pay

## 2019-10-17 VITALS — BP 105/66 | HR 79 | Ht 64.0 in | Wt 164.0 lb

## 2019-10-17 DIAGNOSIS — R519 Headache, unspecified: Secondary | ICD-10-CM | POA: Diagnosis not present

## 2019-10-17 DIAGNOSIS — I6521 Occlusion and stenosis of right carotid artery: Secondary | ICD-10-CM | POA: Diagnosis not present

## 2019-10-17 NOTE — Patient Instructions (Signed)
1.  No change to medication at this time 2.  I will contact you if there are any new recommendations regarding your blood pressure

## 2019-10-24 DIAGNOSIS — M545 Low back pain: Secondary | ICD-10-CM | POA: Diagnosis not present

## 2019-10-24 DIAGNOSIS — M4316 Spondylolisthesis, lumbar region: Secondary | ICD-10-CM | POA: Diagnosis not present

## 2019-10-25 DIAGNOSIS — R1310 Dysphagia, unspecified: Secondary | ICD-10-CM | POA: Diagnosis not present

## 2019-10-25 DIAGNOSIS — K449 Diaphragmatic hernia without obstruction or gangrene: Secondary | ICD-10-CM | POA: Diagnosis not present

## 2019-10-25 DIAGNOSIS — Z7984 Long term (current) use of oral hypoglycemic drugs: Secondary | ICD-10-CM | POA: Diagnosis not present

## 2019-10-25 DIAGNOSIS — R079 Chest pain, unspecified: Secondary | ICD-10-CM | POA: Diagnosis not present

## 2019-10-25 DIAGNOSIS — E119 Type 2 diabetes mellitus without complications: Secondary | ICD-10-CM | POA: Diagnosis not present

## 2019-10-28 DIAGNOSIS — M25562 Pain in left knee: Secondary | ICD-10-CM | POA: Diagnosis not present

## 2019-10-29 DIAGNOSIS — Z0000 Encounter for general adult medical examination without abnormal findings: Secondary | ICD-10-CM | POA: Diagnosis not present

## 2019-10-29 DIAGNOSIS — Z1331 Encounter for screening for depression: Secondary | ICD-10-CM | POA: Diagnosis not present

## 2019-10-29 DIAGNOSIS — Z9181 History of falling: Secondary | ICD-10-CM | POA: Diagnosis not present

## 2019-10-29 DIAGNOSIS — R296 Repeated falls: Secondary | ICD-10-CM | POA: Diagnosis not present

## 2019-10-30 DIAGNOSIS — K449 Diaphragmatic hernia without obstruction or gangrene: Secondary | ICD-10-CM | POA: Insufficient documentation

## 2019-10-30 HISTORY — DX: Diaphragmatic hernia without obstruction or gangrene: K44.9

## 2019-11-01 DIAGNOSIS — M25562 Pain in left knee: Secondary | ICD-10-CM | POA: Diagnosis not present

## 2019-11-06 DIAGNOSIS — M25562 Pain in left knee: Secondary | ICD-10-CM | POA: Diagnosis not present

## 2019-11-07 DIAGNOSIS — M5440 Lumbago with sciatica, unspecified side: Secondary | ICD-10-CM | POA: Diagnosis not present

## 2019-11-07 DIAGNOSIS — I2510 Atherosclerotic heart disease of native coronary artery without angina pectoris: Secondary | ICD-10-CM | POA: Diagnosis not present

## 2019-11-07 DIAGNOSIS — G5793 Unspecified mononeuropathy of bilateral lower limbs: Secondary | ICD-10-CM | POA: Diagnosis not present

## 2019-11-07 DIAGNOSIS — M1990 Unspecified osteoarthritis, unspecified site: Secondary | ICD-10-CM | POA: Diagnosis not present

## 2019-11-18 DIAGNOSIS — M1712 Unilateral primary osteoarthritis, left knee: Secondary | ICD-10-CM | POA: Diagnosis not present

## 2019-11-18 DIAGNOSIS — S83242A Other tear of medial meniscus, current injury, left knee, initial encounter: Secondary | ICD-10-CM | POA: Diagnosis not present

## 2019-11-23 DIAGNOSIS — M545 Low back pain: Secondary | ICD-10-CM | POA: Diagnosis not present

## 2019-11-23 DIAGNOSIS — M4316 Spondylolisthesis, lumbar region: Secondary | ICD-10-CM | POA: Diagnosis not present

## 2019-12-02 DIAGNOSIS — M65341 Trigger finger, right ring finger: Secondary | ICD-10-CM | POA: Diagnosis not present

## 2019-12-07 DIAGNOSIS — C451 Mesothelioma of peritoneum: Secondary | ICD-10-CM | POA: Diagnosis not present

## 2019-12-07 DIAGNOSIS — Z79899 Other long term (current) drug therapy: Secondary | ICD-10-CM | POA: Diagnosis not present

## 2019-12-07 DIAGNOSIS — K449 Diaphragmatic hernia without obstruction or gangrene: Secondary | ICD-10-CM | POA: Diagnosis not present

## 2019-12-07 HISTORY — PX: LAPAROSCOPIC PARAESOPHAGEAL HERNIA REPAIR: SHX6307

## 2019-12-08 DIAGNOSIS — M1990 Unspecified osteoarthritis, unspecified site: Secondary | ICD-10-CM | POA: Diagnosis not present

## 2019-12-08 DIAGNOSIS — I2510 Atherosclerotic heart disease of native coronary artery without angina pectoris: Secondary | ICD-10-CM | POA: Diagnosis not present

## 2019-12-08 DIAGNOSIS — K449 Diaphragmatic hernia without obstruction or gangrene: Secondary | ICD-10-CM | POA: Diagnosis not present

## 2019-12-08 DIAGNOSIS — G5793 Unspecified mononeuropathy of bilateral lower limbs: Secondary | ICD-10-CM | POA: Diagnosis not present

## 2019-12-08 DIAGNOSIS — M5440 Lumbago with sciatica, unspecified side: Secondary | ICD-10-CM | POA: Diagnosis not present

## 2019-12-08 DIAGNOSIS — C451 Mesothelioma of peritoneum: Secondary | ICD-10-CM | POA: Diagnosis not present

## 2019-12-08 DIAGNOSIS — Z79899 Other long term (current) drug therapy: Secondary | ICD-10-CM | POA: Diagnosis not present

## 2019-12-09 DIAGNOSIS — C451 Mesothelioma of peritoneum: Secondary | ICD-10-CM | POA: Diagnosis not present

## 2019-12-09 DIAGNOSIS — Z79899 Other long term (current) drug therapy: Secondary | ICD-10-CM | POA: Diagnosis not present

## 2019-12-09 DIAGNOSIS — K449 Diaphragmatic hernia without obstruction or gangrene: Secondary | ICD-10-CM | POA: Diagnosis not present

## 2019-12-18 ENCOUNTER — Ambulatory Visit (INDEPENDENT_AMBULATORY_CARE_PROVIDER_SITE_OTHER): Payer: Medicare HMO | Admitting: Cardiology

## 2019-12-18 ENCOUNTER — Encounter: Payer: Self-pay | Admitting: Cardiology

## 2019-12-18 ENCOUNTER — Other Ambulatory Visit: Payer: Self-pay

## 2019-12-18 VITALS — BP 102/72 | HR 82 | Ht 64.0 in | Wt 159.0 lb

## 2019-12-18 DIAGNOSIS — I2510 Atherosclerotic heart disease of native coronary artery without angina pectoris: Secondary | ICD-10-CM | POA: Diagnosis not present

## 2019-12-18 DIAGNOSIS — E088 Diabetes mellitus due to underlying condition with unspecified complications: Secondary | ICD-10-CM

## 2019-12-18 DIAGNOSIS — I350 Nonrheumatic aortic (valve) stenosis: Secondary | ICD-10-CM | POA: Diagnosis not present

## 2019-12-18 DIAGNOSIS — R7989 Other specified abnormal findings of blood chemistry: Secondary | ICD-10-CM | POA: Diagnosis not present

## 2019-12-18 DIAGNOSIS — I10 Essential (primary) hypertension: Secondary | ICD-10-CM | POA: Diagnosis not present

## 2019-12-18 DIAGNOSIS — E782 Mixed hyperlipidemia: Secondary | ICD-10-CM | POA: Diagnosis not present

## 2019-12-18 MED ORDER — ROSUVASTATIN CALCIUM 20 MG PO TABS: 20.0000 mg | ORAL_TABLET | Freq: Every day | ORAL | 3 refills | Status: DC

## 2019-12-18 MED ORDER — RANOLAZINE ER 500 MG PO TB12: 500.0000 mg | ORAL_TABLET | Freq: Two times a day (BID) | ORAL | 0 refills | Status: DC

## 2019-12-18 MED ORDER — RANOLAZINE ER 1000 MG PO TB12: 1000.0000 mg | ORAL_TABLET | Freq: Two times a day (BID) | ORAL | 3 refills | Status: DC

## 2019-12-18 NOTE — Progress Notes (Signed)
Cardiology Office Note:    Date:  12/18/2019   ID:  Deborah Hutchinson, DOB July 18, 1945, MRN [id number]  PCP:  [name], MD  Cardiologist:  [name], MD   Referring MD: [name], MD    ASSESSMENT:    1. Coronary artery disease involving native coronary artery of native heart without angina pectoris   2. Essential hypertension   3. Moderate aortic stenosis   4. Diabetes mellitus due to underlying condition with unspecified complications ([place])    PLAN:    In order of problems listed above:  1. Coronary artery disease: Patient has symptoms of discomfort.  Her recent coronary angiography was unremarkable.  I reassured her about my findings.  Her chest pain is atypical for coronary etiology.  But in view of multiple issues such as nonobstructive disease and diabetes mellitus I will initiate her on Ranexa 500 mg twice daily for 2 weeks and then 1 mg twice daily.  Benefits and potential is explained and she vocalized understanding.  EKG done today reveals sinus rhythm and nonspecific ST-T changes. 2. Essential hypertension: Blood pressure stable 3. Mixed dyslipidemia: Diet was emphasized.  I reviewed her lipids.  I will change her from simvastatin to rosuvastatin 20 mg daily to achieve targets.  She is agreeable.  We will do a Chem-7 liver function test today and repeat liver lipid tests in 6 weeks. 4. Moderate aortic stenosis: Asymptomatic at this time.  Findings discussed.  Echo report details below. 5. Patient will be seen in follow-up appointment in 3 months or earlier if the patient has any concerns    Medication Adjustments/Labs and Tests Ordered: Current medicines are reviewed at length with the patient today.  Concerns regarding medicines are outlined above.  No orders of the defined types were placed in this encounter.  No orders of the defined types were placed in this encounter.    No chief complaint on file.    History of Present Illness:    Deborah Hutchinson is a 74 y.o. female.  Patient has past medical history of coronary artery disease nonobstructive in nature by coronary angiography.  Details are mentioned below.  She has moderate aortic stenosis.  She leads a sedentary lifestyle.  She occasionally complains of chest discomfort not related to exertion.  No orthopnea PND syncope or shortness of breath on exertion.  She leads a sedentary lifestyle.  At the time of my evaluation, the patient is alert awake oriented and in no distress.  Past Medical History:  Diagnosis Date  . Angina    normal stress test  . Anxiety   . Arthritis   . Asthma   . Bronchitis   . CAD (coronary artery disease) 02/13/2019  . Chest pain 05/08/2019  . Chest pain of uncertain etiology   . Chronic bilateral low back pain without sciatica 07/11/2019   Formatting of this note might be different from the original. Added automatically from request for surgery [id number]  . COPD (chronic obstructive pulmonary disease) ([place]) 02/09/2017  . 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 Diabetes mellitus   . Diabetes mellitus due to underlying condition with unspecified complications ([place]) 11/14/3901  . Dyslipidemia 06/10/2015  . Essential hypertension 06/10/2015  . GERD (gastroesophageal reflux disease)   . H/O hiatal hernia   . Headache 09/24/2018  . Heart murmur   . Hyperlipemia   . Hypertension   . Mixed dyslipidemia 06/10/2015  . Moderate aortic  stenosis 05/16/2019  . Nonspecific (abnormal) findings on radiological and other examination of gastrointestinal tract 05/19/2011  . OSA (obstructive sleep apnea) 02/09/2017  . Pain and swelling of toe of right foot 07/15/2016  . Palpitations 06/02/2016  . Shortness of breath    on  excertion  . Sleep apnea     Past Surgical History:  Procedure Laterality Date  . ABDOMINAL HYSTERECTOMY    . ABDOMINAL HYSTERECTOMY    . BREAST SURGERY     begin mass,left  breast    . COLONOSCOPY  08/17/2015   Colonic polyp status post polypectomy. Mild pancolonic diverticulosis. Highly redundant colon.   Marland Kitchen DILATION AND CURETTAGE OF UTERUS     one  . ESOPHAGOGASTRODUODENOSCOPY  08/17/2015   Moderate hiatal hernia. Otherwise noraml EGD.   Marland Kitchen ESOPHAGOGASTRODUODENOSCOPY  09/03/2019   [place]  . EUS  05/19/2011   Procedure: UPPER ENDOSCOPIC ULTRASOUND (EUS) LINEAR;  Surgeon: Owens Loffler, MD;  Location: [facility];  Service: Endoscopy;  Laterality: N/A;  radial linear   . HYSTEROTOMY    . KNEE SURGERY    . LEFT HEART CATH AND CORONARY ANGIOGRAPHY    . LEFT HEART CATH AND CORONARY ANGIOGRAPHY N/A 05/08/2019   Procedure: LEFT HEART CATH AND CORONARY ANGIOGRAPHY;  Surgeon: Burnell Blanks, MD;  Location: [facility];  Service: Cardiovascular;  Laterality: N/A;  . right thumb     tendon repaire  . SHOULDER SURGERY     rotator cuff repair  . WRIST SURGERY     rigth    Current Medications: Current Meds  Medication Sig  . albuterol (PROVENTIL HFA;VENTOLIN HFA) 108 (90 Base) MCG/ACT inhaler Inhale 2 puffs into the lungs every 6 (six) hours as needed for wheezing or shortness of breath.  Marland Kitchen amLODipine (NORVASC) 10 MG tablet Take 10 mg by mouth daily.  . budesonide-formoterol (SYMBICORT) 160-4.5 MCG/ACT inhaler Inhale 2 puffs into the lungs 2 (two) times daily.  Marland Kitchen buPROPion (WELLBUTRIN XL) 300 MG 24 hr tablet Take 300 mg by mouth daily.   . canagliflozin (INVOKANA) 300 MG TABS tablet Take 300 mg by mouth daily.  . celecoxib (CELEBREX) 100 MG capsule Take 100-200 mg by mouth daily.  . cetirizine (ZYRTEC) 10 MG tablet Take 1 tablet by mouth at bedtime.  . Cholecalciferol (VITAMIN D PO) Take 5,000 Units by mouth every 30 (thirty) days.   . Cholecalciferol (VITAMIN D3) 100000 UNIT/GM POWD Take 5,000 Units by mouth every 30 (thirty) days.  . clopidogrel (PLAVIX) 75 MG tablet Take 1 tablet (75 mg total) by mouth daily.  . diclofenac Sodium  (VOLTAREN) 1 % GEL Apply 4 g topically as needed.  . dicyclomine (BENTYL) 10 MG capsule Take 10 mg by mouth 2 (two) times daily as needed.  . doxycycline (VIBRA-TABS) 100 MG tablet Take 100 mg by mouth daily.  . ergocalciferol (VITAMIN D2) 1.25 MG (50000 UT) capsule Take 50,000 Units by mouth daily.  Marland Kitchen escitalopram (LEXAPRO) 10 MG tablet Take 10 mg by mouth daily.  . famotidine (PEPCID) 20 MG tablet Take 20 mg by mouth in the morning and at bedtime.  . fluticasone (FLONASE) 50 MCG/ACT nasal spray Place 2 sprays into both nostrils as needed.  . fluticasone (VERAMYST) 27.5 MCG/SPRAY nasal spray Place 2 sprays into the nose daily.  Marland Kitchen gabapentin (NEURONTIN) 300 MG capsule Take 300 mg by mouth as needed.  . hydrALAZINE (APRESOLINE) 50 MG tablet Take 1 tablet (50 mg total) by mouth 3 (three) times daily.  Marland Kitchen  hydrochlorothiazide (HYDRODIURIL) 25 MG tablet TAKE 1 TABLET EVERY DAY  . isosorbide mononitrate (IMDUR) 120 MG 24 hr tablet Take 1 tablet (120 mg total) by mouth daily.  Marland Kitchen linaclotide (LINZESS) 72 MCG capsule Take 72 mcg by mouth as needed.  . liraglutide (VICTOZA) 18 MG/3ML SOPN Inject 1.8 mg into the skin daily.  Marland Kitchen losartan (COZAAR) 100 MG tablet Take 1 tablet (100 mg total) by mouth daily.  . metoCLOPramide (REGLAN) 10 MG tablet Take 1 tablet (10 mg total) by mouth 4 (four) times daily.  . montelukast (SINGULAIR) 10 MG tablet Take 10 mg by mouth at bedtime.   . Multiple Vitamin (MULTIVITAMIN) capsule Take 1 capsule by mouth daily.    . nitroGLYCERIN (NITROSTAT) 0.4 MG SL tablet DISSOLVE ONE TABLET UNDER THE TONGUE EVERY 5 MINUTES AS NEEDED FOR CHEST PAIN.  DO NOT EXCEED A TOTAL OF 3 DOSES IN 15 MINUTES  . oxybutynin (DITROPAN) 5 MG tablet Take 10 mg by mouth daily.  . pantoprazole (PROTONIX) 40 MG tablet Take 1 tablet (40 mg total) by mouth 2 (two) times daily.  . Potassium Chloride CR (MICRO-K) 8 MEQ CPCR capsule CR TAKE 1 CAPSULE BY MOUTH ONCE DAILY FOR LOW POTASSIUM  . simvastatin (ZOCOR)  20 MG tablet Take 20 mg by mouth at bedtime.  Marland Kitchen spironolactone (ALDACTONE) 25 MG tablet Take 1 tablet (25 mg total) by mouth daily.  Marland Kitchen tiotropium (SPIRIVA) 18 MCG inhalation capsule Place 18 mcg into inhaler and inhale daily.     Allergies:   Penicillins, Gabapentin, and Tramadol   Social History   Socioeconomic History  . Marital status: Single    Spouse name: Not on file  . Number of children: 3  . Years of education: Not on file  . Highest education level: Not on file  Occupational History  . Occupation: retired  Tobacco Use  . Smoking status: Former Smoker    Packs/day: 1.00    Quit date: 05/17/2011    Years since quitting: 8.5  . Smokeless tobacco: Never Used  Vaping Use  . Vaping Use: Never used  Substance and Sexual Activity  . Alcohol use: No  . Drug use: No  . Sexual activity: Never  Other Topics Concern  . Not on file  Social History Narrative   Right handed   Social Determinants of Health   Financial Resource Strain:   . Difficulty of Paying Living Expenses:   Food Insecurity:   . Worried About Charity fundraiser in the Last Year:   . Arboriculturist in the Last Year:   Transportation Needs:   . Film/video editor (Medical):   Marland Kitchen Lack of Transportation (Non-Medical):   Physical Activity:   . Days of Exercise per Week:   . Minutes of Exercise per Session:   Stress:   . Feeling of Stress :   Social Connections:   . Frequency of Communication with Friends and Family:   . Frequency of Social Gatherings with Friends and Family:   . Attends Religious Services:   . Active Member of Clubs or Organizations:   . Attends Archivist Meetings:   Marland Kitchen Marital Status:      Family History: The patient's family history includes Diabetes in her sister; Hypertension in her daughter. There is no history of Colon cancer.  ROS:   Please see the history of present illness.    All other systems reviewed and are negative.  EKGs/Labs/Other Studies Reviewed:     The  following studies were reviewed today: IMPRESSIONS    1. Left ventricular ejection fraction, by visual estimation, is 65 to  70%. The left ventricle has hyperdynamic function. There is mildly  increased left ventricular hypertrophy.  2. Elevated left atrial and left ventricular end-diastolic pressures.  3. Left ventricular diastolic parameters are consistent with Grade I  diastolic dysfunction (impaired relaxation).  4. The left ventricle has no regional wall motion abnormalities.  5. Global right ventricle has normal systolic function.The right  ventricular size is normal. No increase in right ventricular wall  thickness.  6. Left atrial size was mildly dilated.  7. Right atrial size was normal.  8. Mild mitral annular calcification.  9. The mitral valve is abnormal. Trivial mitral valve regurgitation.  10. The tricuspid valve is grossly normal.  11. Aortic valve area, by VTI measures 1.16 cm.  12. Aortic valve mean gradient measures 15.4 mmHg.  13. Aortic valve peak gradient measures 31.6 mmHg.  14. The aortic valve is tricuspid. Aortic valve regurgitation is trivial.  Moderate aortic valve stenosis.  15. The pulmonic valve was grossly normal. Pulmonic valve regurgitation is  not visualized.  16. The inferior vena cava is normal in size with greater than 50%  respiratory variability, suggesting right atrial pressure of 3 mmHg.   Burnell Blanks, MD (Primary)    Procedures  LEFT HEART CATH AND CORONARY ANGIOGRAPHY  Conclusion    Prox RCA lesion is 50% stenosed.  Prox LAD lesion is 40% stenosed.  The left ventricular systolic function is normal.  LV end diastolic pressure is normal.  The left ventricular ejection fraction is greater than 65% by visual estimate.  There is no mitral valve regurgitation.   1. Mild non-obstructive disease in the proximal LAD 2. Moderate non-obstructive disease in the proximal RCA 3. Normal LV systolic  function  Medical management of CAD. Explore other causes of chest pain.     Recent Labs: 05/09/2019: Hemoglobin 11.4; Platelets 312 08/19/2019: ALT 18; BUN 15; Creatinine, Ser 1.13; Potassium 4.2; Sodium 140  Recent Lipid Panel    Component Value Date/Time   CHOL 199 08/19/2019 1053   TRIG 73 08/19/2019 1053   HDL 89 08/19/2019 1053   CHOLHDL 2.2 08/19/2019 1053   LDLCALC 97 08/19/2019 1053    Physical Exam:    VS:  BP 102/72   Pulse 82   Ht 5\' 4"  (1.626 m)   Wt 159 lb (72.1 kg)   SpO2 97%   BMI 27.29 kg/m     Wt Readings from Last 3 Encounters:  12/18/19 159 lb (72.1 kg)  10/17/19 164 lb (74.4 kg)  10/11/19 165 lb 8 oz (75.1 kg)     GEN: Patient is in no acute distress HEENT: Normal NECK: No JVD; No carotid bruits LYMPHATICS: No lymphadenopathy CARDIAC: Hear sounds regular, 2/6 systolic murmur at the apex. RESPIRATORY:  Clear to auscultation without rales, wheezing or rhonchi  ABDOMEN: Soft, non-tender, non-distended MUSCULOSKELETAL:  No edema; No deformity  SKIN: Warm and dry NEUROLOGIC:  Alert and oriented x 3 PSYCHIATRIC:  Normal affect   Signed, [name], MD  12/18/2019 4:05 PM    [facility]

## 2019-12-18 NOTE — Patient Instructions (Signed)
Medication Instructions:  Your physician has recommended you make the following change in your medication:   Stop Simvastatin  Start Crestor 20 mg daily. Start Ranexa 500 mg daily for 2 weeks then increase to 1000 mg twice daily.  *If you need a refill on your cardiac medications before your next appointment, please call your pharmacy*   Lab Work: Your physician recommends that you return for lab work in: today. You had a Bmet and LFtT. Your physician recommends that you return for lab work in: 6 weeks (02/03/20) You need to have labs done when you are fasting.  You can come Monday through Friday 8:30 am to 12:00 pm and 1:15 to 4:30. You do not need to make an appointment as the order has already been placed. The labs you are going to have done are BMET, LFT and Lipids.   If you have labs (blood work) drawn today and your tests are completely normal, you will receive your results only by: Marland Kitchen MyChart Message (if you have MyChart) OR . A paper copy in the mail If you have any lab test that is abnormal or we need to change your treatment, we will call you to review the results.   Testing/Procedures: None ordered   Follow-Up: At [facility], you and your health needs are our priority.  As part of our continuing mission to provide you with exceptional heart care, we have created designated Provider Care Teams.  These Care Teams include your primary Cardiologist (physician) and Advanced Practice Providers (APPs -  Physician Assistants and Nurse Practitioners) who all work together to provide you with the care you need, when you need it.  We recommend signing up for the patient portal called "MyChart".  Sign up information is provided on this After Visit Summary.  MyChart is used to connect with patients for Virtual Visits (Telemedicine).  Patients are able to view lab/test results, encounter notes, upcoming appointments, etc.  Non-urgent messages can be sent to your provider as well.   To  learn more about what you can do with MyChart, go to [email or web address].    Your next appointment:   2 month(s)  The format for your next appointment:   In Person  Provider:   Jyl Heinz, MD   Other Instructions Rosuvastatin Tablets What is this medicine? ROSUVASTATIN (roe SOO va sta tin) is known as a HMG-CoA reductase inhibitor or 'statin'. It lowers cholesterol and triglycerides in the blood. This drug may also reduce the risk of heart attack, stroke, or other health problems in patients with risk factors for heart disease. Diet and lifestyle changes are often used with this drug. This medicine may be used for other purposes; ask your health care provider or pharmacist if you have questions. COMMON BRAND NAME(S): Crestor What should I tell my health care provider before I take this medicine? They need to know if you have any of these conditions:  diabetes  if you often drink alcohol  history of stroke  kidney disease  liver disease  muscle aches or weakness  thyroid disease  an unusual or allergic reaction to rosuvastatin, other medicines, foods, dyes, or preservatives  pregnant or trying to get pregnant  breast-feeding How should I use this medicine? Take this medicine by mouth with a glass of water. Follow the directions on the prescription label. Do not cut, crush or chew this medicine. You can take this medicine with or without food. Take your doses at regular intervals. Do not take your medicine  more often than directed. Talk to your pediatrician regarding the use of this medicine in children. While this drug may be prescribed for children as young as 90 years old for selected conditions, precautions do apply. Overdosage: If you think you have taken too much of this medicine contact a poison control center or emergency room at once. NOTE: This medicine is only for you. Do not share this medicine with others. What if I miss a dose? If you miss a dose, take  it as soon as you can. If your next dose is to be taken in less than 12 hours, then do not take the missed dose. Take the next dose at your regular time. Do not take double or extra doses. What may interact with this medicine? Do not take this medicine with any of the following medications:  herbal medicines like red yeast rice This medicine may also interact with the following medications:  alcohol  antacids containing aluminum hydroxide or magnesium hydroxide  cyclosporine  other medicines for high cholesterol  some medicines for HIV infection  warfarin This list may not describe all possible interactions. Give your health care provider a list of all the medicines, herbs, non-prescription drugs, or dietary supplements you use. Also tell them if you smoke, drink alcohol, or use illegal drugs. Some items may interact with your medicine. What should I watch for while using this medicine? Visit your doctor or health care professional for regular check-ups. You may need regular tests to make sure your liver is working properly. Your health care professional may tell you to stop taking this medicine if you develop muscle problems. If your muscle problems do not go away after stopping this medicine, contact your health care professional. Do not become pregnant while taking this medicine. Women should inform their health care professional if they wish to become pregnant or think they might be pregnant. There is a potential for serious side effects to an unborn child. Talk to your health care professional or pharmacist for more information. Do not breast-feed an infant while taking this medicine. This medicine may increase blood sugar. Ask your healthcare provider if changes in diet or medicines are needed if you have diabetes. If you are going to need surgery or other procedure, tell your doctor that you are using this medicine. This drug is only part of a total heart-health program. Your doctor  or a dietician can suggest a low-cholesterol and low-fat diet to help. Avoid alcohol and smoking, and keep a proper exercise schedule. This medicine may cause a decrease in Co-Enzyme Q-10. You should make sure that you get enough Co-Enzyme Q-10 while you are taking this medicine. Discuss the foods you eat and the vitamins you take with your health care professional. What side effects may I notice from receiving this medicine? Side effects that you should report to your doctor or health care professional as soon as possible:  allergic reactions like skin rash, itching or hives, swelling of the face, lips, or tongue  confusion  joint pain  loss of memory  redness, blistering, peeling or loosening of the skin, including inside the mouth  signs and symptoms of high blood sugar such as being more thirsty or hungry or having to urinate more than normal. You may also feel very tired or have blurry vision.  signs and symptoms of muscle injury like dark urine; trouble passing urine or change in the amount of urine; unusually weak or tired; muscle pain or side or back pain  yellowing of the eyes or skin Side effects that usually do not require medical attention (report to your doctor or health care professional if they continue or are bothersome):  constipation  diarrhea  dizziness  gas  headache  nausea  stomach pain  trouble sleeping  upset stomach This list may not describe all possible side effects. Call your doctor for medical advice about side effects. You may report side effects to FDA at 1-800-FDA-1088. Where should I keep my medicine? Keep out of the reach of children. Store at room temperature between 20 and 25 degrees C (68 and 77 degrees F). Keep container tightly closed (protect from moisture). Throw away any unused medicine after the expiration date. NOTE: This sheet is a summary. It may not cover all possible information. If you have questions about this medicine, talk  to your doctor, pharmacist, or health care provider.  2020 Elsevier/Gold Standard (2018-02-15 08:25:08) Ranolazine tablets, extended release What is this medicine? RANOLAZINE (ra NOE la zeen) is a heart medicine. It is used to treat chronic chest pain (angina). This medicine must be taken regularly. It will not relieve an acute episode of chest pain. This medicine may be used for other purposes; ask your health care provider or pharmacist if you have questions. COMMON BRAND NAME(S): Ranexa What should I tell my health care provider before I take this medicine? They need to know if you have any of these conditions:  heart disease  irregular heartbeat  kidney disease  liver disease  low levels of potassium or magnesium in the blood  an unusual or allergic reaction to ranolazine, other medicines, foods, dyes, or preservatives  pregnant or trying to get pregnant  breast-feeding How should I use this medicine? Take this medicine by mouth with a glass of water. Follow the directions on the prescription label. Do not cut, crush, or chew this medicine. Take with or without food. Do not take this medication with grapefruit juice. Take your doses at regular intervals. Do not take your medicine more often then directed. Talk to your pediatrician regarding the use of this medicine in children. Special care may be needed. Overdosage: If you think you have taken too much of this medicine contact a poison control center or emergency room at once. NOTE: This medicine is only for you. Do not share this medicine with others. What if I miss a dose? If you miss a dose, take it as soon as you can. If it is almost time for your next dose, take only that dose. Do not take double or extra doses. What may interact with this medicine? Do not take this medicine with any of the following medications:  antivirals for HIV or AIDS  cerivastatin  certain antibiotics like chloramphenicol, clarithromycin,  dalfopristin; quinupristin, isoniazid, rifabutin, rifampin, rifapentine  certain medicines used for cancer like imatinib, nilotinib  certain medicines for fungal infections like fluconazole, itraconazole, ketoconazole, posaconazole, voriconazole  certain medicines for irregular heart beat like dronedarone  certain medicines for seizures like carbamazepine, fosphenytoin, oxcarbazepine, phenobarbital, phenytoin  cisapride  conivaptan  cyclosporine  grapefruit or grapefruit juice  lumacaftor; ivacaftor  nefazodone  pimozide  quinacrine  St John's wort  thioridazine This medicine may also interact with the following medications:  alfuzosin  certain medicines for depression, anxiety, or psychotic disturbances like bupropion, citalopram, fluoxetine, fluphenazine, paroxetine, perphenazine, risperidone, sertraline, trifluoperazine  certain medicines for cholesterol like atorvastatin, lovastatin, simvastatin  certain medicines for stomach problems like octreotide, palonosetron, prochlorperazine  eplerenone  ergot alkaloids  like dihydroergotamine, ergonovine, ergotamine, methylergonovine  metformin  nicardipine  other medicines that prolong the QT interval (cause an abnormal heart rhythm) like dofetilide, ziprasidone  sirolimus  tacrolimus This list may not describe all possible interactions. Give your health care provider a list of all the medicines, herbs, non-prescription drugs, or dietary supplements you use. Also tell them if you smoke, drink alcohol, or use illegal drugs. Some items may interact with your medicine. What should I watch for while using this medicine? Visit your doctor for regular check ups. Tell your doctor or healthcare professional if your symptoms do not start to get better or if they get worse. This medicine will not relieve an acute attack of angina or chest pain. This medicine can change your heart rhythm. Your health care provider may check  your heart rhythm by ordering an electrocardiogram (ECG) while you are taking this medicine. You may get drowsy or dizzy. Do not drive, use machinery, or do anything that needs mental alertness until you know how this medicine affects you. Do not stand or sit up quickly, especially if you are an older patient. This reduces the risk of dizzy or fainting spells. Alcohol may interfere with the effect of this medicine. Avoid alcoholic drinks. If you are scheduled for any medical or dental procedure, tell your healthcare provider that you are taking this medicine. This medicine can interact with other medicines used during surgery. What side effects may I notice from receiving this medicine? Side effects that you should report to your doctor or health care professional as soon as possible:  allergic reactions like skin rash, itching or hives, swelling of the face, lips, or tongue  breathing problems  changes in vision  fast, irregular or pounding heartbeat  feeling faint or lightheaded, falls  low or high blood pressure  numbness or tingling feelings  ringing in the ears  tremor or shakiness  slow heartbeat (fewer than 50 beats per minute)  swelling of the legs or feet Side effects that usually do not require medical attention (report to your doctor or health care professional if they continue or are bothersome):  constipation  drowsy  dry mouth  headache  nausea or vomiting  stomach upset This list may not describe all possible side effects. Call your doctor for medical advice about side effects. You may report side effects to FDA at 1-800-FDA-1088. Where should I keep my medicine? Keep out of the reach of children. Store at room temperature between 15 and 30 degrees C (59 and 86 degrees F). Throw away any unused medicine after the expiration date. NOTE: This sheet is a summary. It may not cover all possible information. If you have questions about this medicine, talk to your  doctor, pharmacist, or health care provider.  2020 Elsevier/Gold Standard (2018-04-17 09:18:49)

## 2019-12-19 DIAGNOSIS — J449 Chronic obstructive pulmonary disease, unspecified: Secondary | ICD-10-CM | POA: Diagnosis not present

## 2019-12-19 DIAGNOSIS — E1140 Type 2 diabetes mellitus with diabetic neuropathy, unspecified: Secondary | ICD-10-CM | POA: Diagnosis not present

## 2019-12-19 DIAGNOSIS — E663 Overweight: Secondary | ICD-10-CM | POA: Diagnosis not present

## 2019-12-19 DIAGNOSIS — Z6828 Body mass index (BMI) 28.0-28.9, adult: Secondary | ICD-10-CM | POA: Diagnosis not present

## 2019-12-19 DIAGNOSIS — Z79899 Other long term (current) drug therapy: Secondary | ICD-10-CM | POA: Diagnosis not present

## 2019-12-19 DIAGNOSIS — K219 Gastro-esophageal reflux disease without esophagitis: Secondary | ICD-10-CM | POA: Diagnosis not present

## 2019-12-19 DIAGNOSIS — Z6827 Body mass index (BMI) 27.0-27.9, adult: Secondary | ICD-10-CM | POA: Diagnosis not present

## 2019-12-19 DIAGNOSIS — Z09 Encounter for follow-up examination after completed treatment for conditions other than malignant neoplasm: Secondary | ICD-10-CM | POA: Diagnosis not present

## 2019-12-19 LAB — BASIC METABOLIC PANEL
BUN/Creatinine Ratio: 8 — ABNORMAL LOW (ref 12–28)
BUN: 11 mg/dL (ref 8–27)
CO2: 24 mmol/L (ref 20–29)
Calcium: 10.2 mg/dL (ref 8.7–10.3)
Chloride: 100 mmol/L (ref 96–106)
Creatinine, Ser: 1.39 mg/dL — ABNORMAL HIGH (ref 0.57–1.00)
GFR calc Af Amer: 42 mL/min/{1.73_m2} — ABNORMAL LOW (ref >59)
GFR calc non Af Amer: 37 mL/min/{1.73_m2} — ABNORMAL LOW (ref >59)
Glucose: 83 mg/dL (ref 65–99)
Potassium: 4.2 mmol/L (ref 3.5–5.2)
Sodium: 139 mmol/L (ref 134–144)

## 2019-12-19 LAB — HEPATIC FUNCTION PANEL
ALT: 42 IU/L — ABNORMAL HIGH (ref 0–32)
AST: 17 IU/L (ref 0–40)
Albumin: 4.4 g/dL (ref 3.7–4.7)
Alkaline Phosphatase: 102 IU/L (ref 48–121)
Bilirubin Total: <0.2 mg/dL (ref 0.0–1.2)
Bilirubin, Direct: 0.1 mg/dL (ref 0.00–0.40)
Total Protein: 6.5 g/dL (ref 6.0–8.5)

## 2019-12-23 ENCOUNTER — Telehealth: Payer: Self-pay | Admitting: Cardiology

## 2019-12-23 NOTE — Telephone Encounter (Signed)
Pt c/o medication issue:  1. Name of Medication: ranolazine (RANEXA) 500 MG 12 hr tablet  2. How are you currently taking this medication (dosage and times per day)? As directed  3. Are you having a reaction (difficulty breathing--STAT)? no  4. What is your medication issue? Patient just had hernia surgery and is on an all soft foods diet. She states that these pills are too big.

## 2019-12-23 NOTE — Telephone Encounter (Signed)
Pt states that she cannot swallow Ranexa tablets as they are to big. Ranexa is not a medication that she can cut in half or crush. How do you advise?

## 2019-12-23 NOTE — Telephone Encounter (Signed)
Pt is aware that we will address the concern of being unable to swallow Ranexa. Pt verbalized understanding and had no additional questions.

## 2019-12-23 NOTE — Telephone Encounter (Signed)
Well, look like she will not be able to take ranolazine.  Please asked Dr. Geraldo Pitter what he want her to take when he will be back.

## 2019-12-23 NOTE — Addendum Note (Signed)
Addended by: Truddie Hidden on: 12/23/2019 12:30 PM   Modules accepted: Orders

## 2019-12-24 DIAGNOSIS — M545 Low back pain: Secondary | ICD-10-CM | POA: Diagnosis not present

## 2019-12-24 DIAGNOSIS — M4316 Spondylolisthesis, lumbar region: Secondary | ICD-10-CM | POA: Diagnosis not present

## 2019-12-24 NOTE — Telephone Encounter (Signed)
She can cut it

## 2019-12-27 DIAGNOSIS — I959 Hypotension, unspecified: Secondary | ICD-10-CM | POA: Diagnosis not present

## 2020-01-01 DIAGNOSIS — C451 Mesothelioma of peritoneum: Secondary | ICD-10-CM | POA: Diagnosis not present

## 2020-01-02 ENCOUNTER — Telehealth: Payer: Self-pay | Admitting: Cardiology

## 2020-01-02 DIAGNOSIS — Z6827 Body mass index (BMI) 27.0-27.9, adult: Secondary | ICD-10-CM | POA: Diagnosis not present

## 2020-01-02 DIAGNOSIS — E663 Overweight: Secondary | ICD-10-CM | POA: Diagnosis not present

## 2020-01-02 DIAGNOSIS — I951 Orthostatic hypotension: Secondary | ICD-10-CM | POA: Diagnosis not present

## 2020-01-02 DIAGNOSIS — J45909 Unspecified asthma, uncomplicated: Secondary | ICD-10-CM | POA: Diagnosis not present

## 2020-01-02 DIAGNOSIS — R42 Dizziness and giddiness: Secondary | ICD-10-CM | POA: Diagnosis not present

## 2020-01-02 DIAGNOSIS — R296 Repeated falls: Secondary | ICD-10-CM | POA: Diagnosis not present

## 2020-01-02 NOTE — Telephone Encounter (Signed)
If there are 2 potential after she has quit in

## 2020-01-02 NOTE — Telephone Encounter (Signed)
Sorry about previous message.  If the pills are too big and she has a difficulty swallowing them she should not use this medication.

## 2020-01-02 NOTE — Addendum Note (Signed)
Addended by: Truddie Hidden on: 01/02/2020 05:12 PM   Modules accepted: Orders

## 2020-01-02 NOTE — Telephone Encounter (Signed)
Pt advised to stop the medication as it is to difficult to stop.

## 2020-01-02 NOTE — Telephone Encounter (Signed)
Pt c/o medication issue:  1. Name of Medication: ranolazine (RANEXA) 1000 MG SR tablet  2. How are you currently taking this medication (dosage and times per day)?   3. Are you having a reaction (difficulty breathing--STAT)? no  4. What is your medication issue? Patient says the pills are too big for her to swallow and she would like Dr. Geraldo Pitter to send her pharmacy another medication to take. She said Dr. Geraldo Pitter told her to cut the medication in half but the pharmacy did not advise to do that because they are time released. She did try to cut the pills in half but she still had difficulty swallowing the medication  The patient said she would not like liquid medicine   The patient would like the medicine to go to her local pharmacy [place], [place] - [address]

## 2020-01-03 DIAGNOSIS — C451 Mesothelioma of peritoneum: Secondary | ICD-10-CM | POA: Diagnosis not present

## 2020-01-05 DIAGNOSIS — M7062 Trochanteric bursitis, left hip: Secondary | ICD-10-CM | POA: Diagnosis not present

## 2020-01-05 DIAGNOSIS — R270 Ataxia, unspecified: Secondary | ICD-10-CM | POA: Diagnosis not present

## 2020-01-05 DIAGNOSIS — E1140 Type 2 diabetes mellitus with diabetic neuropathy, unspecified: Secondary | ICD-10-CM | POA: Diagnosis not present

## 2020-01-05 DIAGNOSIS — F329 Major depressive disorder, single episode, unspecified: Secondary | ICD-10-CM | POA: Diagnosis not present

## 2020-01-05 DIAGNOSIS — F419 Anxiety disorder, unspecified: Secondary | ICD-10-CM | POA: Diagnosis not present

## 2020-01-05 DIAGNOSIS — I10 Essential (primary) hypertension: Secondary | ICD-10-CM | POA: Diagnosis not present

## 2020-01-05 DIAGNOSIS — J449 Chronic obstructive pulmonary disease, unspecified: Secondary | ICD-10-CM | POA: Diagnosis not present

## 2020-01-05 DIAGNOSIS — I2510 Atherosclerotic heart disease of native coronary artery without angina pectoris: Secondary | ICD-10-CM | POA: Diagnosis not present

## 2020-01-05 DIAGNOSIS — I951 Orthostatic hypotension: Secondary | ICD-10-CM | POA: Diagnosis not present

## 2020-01-08 DIAGNOSIS — M7062 Trochanteric bursitis, left hip: Secondary | ICD-10-CM | POA: Diagnosis not present

## 2020-01-08 DIAGNOSIS — R590 Localized enlarged lymph nodes: Secondary | ICD-10-CM | POA: Diagnosis not present

## 2020-01-08 DIAGNOSIS — F419 Anxiety disorder, unspecified: Secondary | ICD-10-CM | POA: Diagnosis not present

## 2020-01-08 DIAGNOSIS — R918 Other nonspecific abnormal finding of lung field: Secondary | ICD-10-CM | POA: Diagnosis not present

## 2020-01-08 DIAGNOSIS — M5440 Lumbago with sciatica, unspecified side: Secondary | ICD-10-CM | POA: Diagnosis not present

## 2020-01-08 DIAGNOSIS — R270 Ataxia, unspecified: Secondary | ICD-10-CM | POA: Diagnosis not present

## 2020-01-08 DIAGNOSIS — C451 Mesothelioma of peritoneum: Secondary | ICD-10-CM | POA: Diagnosis not present

## 2020-01-08 DIAGNOSIS — I2510 Atherosclerotic heart disease of native coronary artery without angina pectoris: Secondary | ICD-10-CM | POA: Diagnosis not present

## 2020-01-08 DIAGNOSIS — G5793 Unspecified mononeuropathy of bilateral lower limbs: Secondary | ICD-10-CM | POA: Diagnosis not present

## 2020-01-08 DIAGNOSIS — E1140 Type 2 diabetes mellitus with diabetic neuropathy, unspecified: Secondary | ICD-10-CM | POA: Diagnosis not present

## 2020-01-08 DIAGNOSIS — F329 Major depressive disorder, single episode, unspecified: Secondary | ICD-10-CM | POA: Diagnosis not present

## 2020-01-08 DIAGNOSIS — I10 Essential (primary) hypertension: Secondary | ICD-10-CM | POA: Diagnosis not present

## 2020-01-08 DIAGNOSIS — M1990 Unspecified osteoarthritis, unspecified site: Secondary | ICD-10-CM | POA: Diagnosis not present

## 2020-01-08 DIAGNOSIS — I951 Orthostatic hypotension: Secondary | ICD-10-CM | POA: Diagnosis not present

## 2020-01-08 DIAGNOSIS — J449 Chronic obstructive pulmonary disease, unspecified: Secondary | ICD-10-CM | POA: Diagnosis not present

## 2020-01-09 ENCOUNTER — Telehealth: Payer: Self-pay | Admitting: Cardiology

## 2020-01-09 NOTE — Telephone Encounter (Signed)
Pt c/o medication issue:  1. Name of Medication: rosuvastatin (CRESTOR) 20 MG tablet / simvastatin (ZOCOR) 20 MG tablet  2. How are you currently taking this medication (dosage and times per day)? As directed  3. Are you having a reaction (difficulty breathing--STAT)? no  4. What is your medication issue? Baker Janus, patient's home health nurse would like to go over this patients medication list with a nurse. She wants to know if the patient should be on both of these statin's. She states she is going out to the patient's home tomorrow to help her get organized with her daily medication and needs clarification.

## 2020-01-09 NOTE — Telephone Encounter (Signed)
Spoke with home health nurse and verified medication.

## 2020-01-10 ENCOUNTER — Telehealth: Payer: Self-pay | Admitting: Gastroenterology

## 2020-01-10 DIAGNOSIS — R270 Ataxia, unspecified: Secondary | ICD-10-CM | POA: Diagnosis not present

## 2020-01-10 DIAGNOSIS — F419 Anxiety disorder, unspecified: Secondary | ICD-10-CM | POA: Diagnosis not present

## 2020-01-10 DIAGNOSIS — J449 Chronic obstructive pulmonary disease, unspecified: Secondary | ICD-10-CM | POA: Diagnosis not present

## 2020-01-10 DIAGNOSIS — E1140 Type 2 diabetes mellitus with diabetic neuropathy, unspecified: Secondary | ICD-10-CM | POA: Diagnosis not present

## 2020-01-10 DIAGNOSIS — F329 Major depressive disorder, single episode, unspecified: Secondary | ICD-10-CM | POA: Diagnosis not present

## 2020-01-10 DIAGNOSIS — I951 Orthostatic hypotension: Secondary | ICD-10-CM | POA: Diagnosis not present

## 2020-01-10 DIAGNOSIS — M7062 Trochanteric bursitis, left hip: Secondary | ICD-10-CM | POA: Diagnosis not present

## 2020-01-10 DIAGNOSIS — I10 Essential (primary) hypertension: Secondary | ICD-10-CM | POA: Diagnosis not present

## 2020-01-10 DIAGNOSIS — I2510 Atherosclerotic heart disease of native coronary artery without angina pectoris: Secondary | ICD-10-CM | POA: Diagnosis not present

## 2020-01-14 DIAGNOSIS — I10 Essential (primary) hypertension: Secondary | ICD-10-CM | POA: Diagnosis not present

## 2020-01-14 DIAGNOSIS — I2510 Atherosclerotic heart disease of native coronary artery without angina pectoris: Secondary | ICD-10-CM | POA: Diagnosis not present

## 2020-01-14 DIAGNOSIS — M7062 Trochanteric bursitis, left hip: Secondary | ICD-10-CM | POA: Diagnosis not present

## 2020-01-14 DIAGNOSIS — R270 Ataxia, unspecified: Secondary | ICD-10-CM | POA: Diagnosis not present

## 2020-01-14 DIAGNOSIS — I951 Orthostatic hypotension: Secondary | ICD-10-CM | POA: Diagnosis not present

## 2020-01-14 DIAGNOSIS — F329 Major depressive disorder, single episode, unspecified: Secondary | ICD-10-CM | POA: Diagnosis not present

## 2020-01-14 DIAGNOSIS — E1140 Type 2 diabetes mellitus with diabetic neuropathy, unspecified: Secondary | ICD-10-CM | POA: Diagnosis not present

## 2020-01-14 DIAGNOSIS — F419 Anxiety disorder, unspecified: Secondary | ICD-10-CM | POA: Diagnosis not present

## 2020-01-14 DIAGNOSIS — J449 Chronic obstructive pulmonary disease, unspecified: Secondary | ICD-10-CM | POA: Diagnosis not present

## 2020-01-14 NOTE — Telephone Encounter (Signed)
I have returned call and spoke to the nurse and went over directions based on Dr. Leland Her last note.

## 2020-01-15 DIAGNOSIS — I10 Essential (primary) hypertension: Secondary | ICD-10-CM | POA: Diagnosis not present

## 2020-01-15 DIAGNOSIS — I951 Orthostatic hypotension: Secondary | ICD-10-CM | POA: Diagnosis not present

## 2020-01-15 DIAGNOSIS — E663 Overweight: Secondary | ICD-10-CM | POA: Diagnosis not present

## 2020-01-15 DIAGNOSIS — R42 Dizziness and giddiness: Secondary | ICD-10-CM | POA: Diagnosis not present

## 2020-01-15 DIAGNOSIS — K219 Gastro-esophageal reflux disease without esophagitis: Secondary | ICD-10-CM | POA: Diagnosis not present

## 2020-01-15 DIAGNOSIS — J449 Chronic obstructive pulmonary disease, unspecified: Secondary | ICD-10-CM | POA: Diagnosis not present

## 2020-01-15 DIAGNOSIS — Z6827 Body mass index (BMI) 27.0-27.9, adult: Secondary | ICD-10-CM | POA: Diagnosis not present

## 2020-01-15 DIAGNOSIS — J45909 Unspecified asthma, uncomplicated: Secondary | ICD-10-CM | POA: Diagnosis not present

## 2020-01-16 ENCOUNTER — Other Ambulatory Visit: Payer: Self-pay

## 2020-01-16 DIAGNOSIS — R519 Headache, unspecified: Secondary | ICD-10-CM

## 2020-01-16 DIAGNOSIS — H9313 Tinnitus, bilateral: Secondary | ICD-10-CM

## 2020-01-16 DIAGNOSIS — C451 Mesothelioma of peritoneum: Secondary | ICD-10-CM

## 2020-01-16 HISTORY — DX: Mesothelioma of peritoneum: C45.1

## 2020-01-16 MED ORDER — CLOPIDOGREL BISULFATE 75 MG PO TABS: 75.0000 mg | ORAL_TABLET | Freq: Every day | ORAL | 2 refills | Status: DC

## 2020-01-16 NOTE — Progress Notes (Signed)
Fax received from [employer], Pt needs to have Plavix sent to their pharmacy.

## 2020-01-17 DIAGNOSIS — E279 Disorder of adrenal gland, unspecified: Secondary | ICD-10-CM | POA: Diagnosis not present

## 2020-01-17 DIAGNOSIS — J449 Chronic obstructive pulmonary disease, unspecified: Secondary | ICD-10-CM | POA: Diagnosis not present

## 2020-01-17 DIAGNOSIS — R270 Ataxia, unspecified: Secondary | ICD-10-CM | POA: Diagnosis not present

## 2020-01-17 DIAGNOSIS — M7062 Trochanteric bursitis, left hip: Secondary | ICD-10-CM | POA: Diagnosis not present

## 2020-01-17 DIAGNOSIS — F329 Major depressive disorder, single episode, unspecified: Secondary | ICD-10-CM | POA: Diagnosis not present

## 2020-01-17 DIAGNOSIS — E1140 Type 2 diabetes mellitus with diabetic neuropathy, unspecified: Secondary | ICD-10-CM | POA: Diagnosis not present

## 2020-01-17 DIAGNOSIS — I10 Essential (primary) hypertension: Secondary | ICD-10-CM | POA: Diagnosis not present

## 2020-01-17 DIAGNOSIS — C451 Mesothelioma of peritoneum: Secondary | ICD-10-CM | POA: Diagnosis not present

## 2020-01-17 DIAGNOSIS — F419 Anxiety disorder, unspecified: Secondary | ICD-10-CM | POA: Diagnosis not present

## 2020-01-17 DIAGNOSIS — I951 Orthostatic hypotension: Secondary | ICD-10-CM | POA: Diagnosis not present

## 2020-01-17 DIAGNOSIS — Z23 Encounter for immunization: Secondary | ICD-10-CM | POA: Diagnosis not present

## 2020-01-17 DIAGNOSIS — R918 Other nonspecific abnormal finding of lung field: Secondary | ICD-10-CM | POA: Diagnosis not present

## 2020-01-17 DIAGNOSIS — I2510 Atherosclerotic heart disease of native coronary artery without angina pectoris: Secondary | ICD-10-CM | POA: Diagnosis not present

## 2020-01-17 DIAGNOSIS — M549 Dorsalgia, unspecified: Secondary | ICD-10-CM | POA: Diagnosis not present

## 2020-01-20 DIAGNOSIS — H5203 Hypermetropia, bilateral: Secondary | ICD-10-CM | POA: Diagnosis not present

## 2020-01-21 DIAGNOSIS — I951 Orthostatic hypotension: Secondary | ICD-10-CM | POA: Diagnosis not present

## 2020-01-21 DIAGNOSIS — J449 Chronic obstructive pulmonary disease, unspecified: Secondary | ICD-10-CM | POA: Diagnosis not present

## 2020-01-21 DIAGNOSIS — F419 Anxiety disorder, unspecified: Secondary | ICD-10-CM | POA: Diagnosis not present

## 2020-01-21 DIAGNOSIS — M7062 Trochanteric bursitis, left hip: Secondary | ICD-10-CM | POA: Diagnosis not present

## 2020-01-21 DIAGNOSIS — R270 Ataxia, unspecified: Secondary | ICD-10-CM | POA: Diagnosis not present

## 2020-01-21 DIAGNOSIS — F329 Major depressive disorder, single episode, unspecified: Secondary | ICD-10-CM | POA: Diagnosis not present

## 2020-01-21 DIAGNOSIS — E1140 Type 2 diabetes mellitus with diabetic neuropathy, unspecified: Secondary | ICD-10-CM | POA: Diagnosis not present

## 2020-01-21 DIAGNOSIS — I10 Essential (primary) hypertension: Secondary | ICD-10-CM | POA: Diagnosis not present

## 2020-01-21 DIAGNOSIS — I2510 Atherosclerotic heart disease of native coronary artery without angina pectoris: Secondary | ICD-10-CM | POA: Diagnosis not present

## 2020-01-22 DIAGNOSIS — Z79899 Other long term (current) drug therapy: Secondary | ICD-10-CM | POA: Diagnosis not present

## 2020-01-22 DIAGNOSIS — K3184 Gastroparesis: Secondary | ICD-10-CM | POA: Diagnosis not present

## 2020-01-22 DIAGNOSIS — R112 Nausea with vomiting, unspecified: Secondary | ICD-10-CM | POA: Diagnosis not present

## 2020-01-24 DIAGNOSIS — I2510 Atherosclerotic heart disease of native coronary artery without angina pectoris: Secondary | ICD-10-CM | POA: Diagnosis not present

## 2020-01-24 DIAGNOSIS — F419 Anxiety disorder, unspecified: Secondary | ICD-10-CM | POA: Diagnosis not present

## 2020-01-24 DIAGNOSIS — I951 Orthostatic hypotension: Secondary | ICD-10-CM | POA: Diagnosis not present

## 2020-01-24 DIAGNOSIS — I10 Essential (primary) hypertension: Secondary | ICD-10-CM | POA: Diagnosis not present

## 2020-01-24 DIAGNOSIS — J449 Chronic obstructive pulmonary disease, unspecified: Secondary | ICD-10-CM | POA: Diagnosis not present

## 2020-01-24 DIAGNOSIS — M545 Low back pain: Secondary | ICD-10-CM | POA: Diagnosis not present

## 2020-01-24 DIAGNOSIS — F329 Major depressive disorder, single episode, unspecified: Secondary | ICD-10-CM | POA: Diagnosis not present

## 2020-01-24 DIAGNOSIS — M4316 Spondylolisthesis, lumbar region: Secondary | ICD-10-CM | POA: Diagnosis not present

## 2020-01-24 DIAGNOSIS — R270 Ataxia, unspecified: Secondary | ICD-10-CM | POA: Diagnosis not present

## 2020-01-24 DIAGNOSIS — M7062 Trochanteric bursitis, left hip: Secondary | ICD-10-CM | POA: Diagnosis not present

## 2020-01-24 DIAGNOSIS — E1140 Type 2 diabetes mellitus with diabetic neuropathy, unspecified: Secondary | ICD-10-CM | POA: Diagnosis not present

## 2020-01-28 DIAGNOSIS — M7062 Trochanteric bursitis, left hip: Secondary | ICD-10-CM | POA: Diagnosis not present

## 2020-01-28 DIAGNOSIS — J449 Chronic obstructive pulmonary disease, unspecified: Secondary | ICD-10-CM | POA: Diagnosis not present

## 2020-01-28 DIAGNOSIS — I951 Orthostatic hypotension: Secondary | ICD-10-CM | POA: Diagnosis not present

## 2020-01-28 DIAGNOSIS — F419 Anxiety disorder, unspecified: Secondary | ICD-10-CM | POA: Diagnosis not present

## 2020-01-28 DIAGNOSIS — I10 Essential (primary) hypertension: Secondary | ICD-10-CM | POA: Diagnosis not present

## 2020-01-28 DIAGNOSIS — E1140 Type 2 diabetes mellitus with diabetic neuropathy, unspecified: Secondary | ICD-10-CM | POA: Diagnosis not present

## 2020-01-28 DIAGNOSIS — I2510 Atherosclerotic heart disease of native coronary artery without angina pectoris: Secondary | ICD-10-CM | POA: Diagnosis not present

## 2020-01-28 DIAGNOSIS — R270 Ataxia, unspecified: Secondary | ICD-10-CM | POA: Diagnosis not present

## 2020-01-28 DIAGNOSIS — F329 Major depressive disorder, single episode, unspecified: Secondary | ICD-10-CM | POA: Diagnosis not present

## 2020-01-30 DIAGNOSIS — I951 Orthostatic hypotension: Secondary | ICD-10-CM | POA: Diagnosis not present

## 2020-01-30 DIAGNOSIS — F329 Major depressive disorder, single episode, unspecified: Secondary | ICD-10-CM | POA: Diagnosis not present

## 2020-01-30 DIAGNOSIS — F419 Anxiety disorder, unspecified: Secondary | ICD-10-CM | POA: Diagnosis not present

## 2020-01-30 DIAGNOSIS — I2510 Atherosclerotic heart disease of native coronary artery without angina pectoris: Secondary | ICD-10-CM | POA: Diagnosis not present

## 2020-01-30 DIAGNOSIS — I10 Essential (primary) hypertension: Secondary | ICD-10-CM | POA: Diagnosis not present

## 2020-01-30 DIAGNOSIS — M7062 Trochanteric bursitis, left hip: Secondary | ICD-10-CM | POA: Diagnosis not present

## 2020-01-30 DIAGNOSIS — J449 Chronic obstructive pulmonary disease, unspecified: Secondary | ICD-10-CM | POA: Diagnosis not present

## 2020-01-30 DIAGNOSIS — R270 Ataxia, unspecified: Secondary | ICD-10-CM | POA: Diagnosis not present

## 2020-01-30 DIAGNOSIS — E1140 Type 2 diabetes mellitus with diabetic neuropathy, unspecified: Secondary | ICD-10-CM | POA: Diagnosis not present

## 2020-02-03 DIAGNOSIS — E1140 Type 2 diabetes mellitus with diabetic neuropathy, unspecified: Secondary | ICD-10-CM | POA: Diagnosis not present

## 2020-02-03 DIAGNOSIS — J449 Chronic obstructive pulmonary disease, unspecified: Secondary | ICD-10-CM | POA: Diagnosis not present

## 2020-02-03 DIAGNOSIS — F419 Anxiety disorder, unspecified: Secondary | ICD-10-CM | POA: Diagnosis not present

## 2020-02-03 DIAGNOSIS — M7062 Trochanteric bursitis, left hip: Secondary | ICD-10-CM | POA: Diagnosis not present

## 2020-02-03 DIAGNOSIS — E782 Mixed hyperlipidemia: Secondary | ICD-10-CM | POA: Diagnosis not present

## 2020-02-03 DIAGNOSIS — I951 Orthostatic hypotension: Secondary | ICD-10-CM | POA: Diagnosis not present

## 2020-02-03 DIAGNOSIS — R270 Ataxia, unspecified: Secondary | ICD-10-CM | POA: Diagnosis not present

## 2020-02-03 DIAGNOSIS — I2510 Atherosclerotic heart disease of native coronary artery without angina pectoris: Secondary | ICD-10-CM | POA: Diagnosis not present

## 2020-02-03 DIAGNOSIS — F329 Major depressive disorder, single episode, unspecified: Secondary | ICD-10-CM | POA: Diagnosis not present

## 2020-02-03 DIAGNOSIS — I10 Essential (primary) hypertension: Secondary | ICD-10-CM | POA: Diagnosis not present

## 2020-02-03 LAB — LIPID PANEL
Chol/HDL Ratio: 1.9 ratio (ref 0.0–4.4)
Cholesterol, Total: 159 mg/dL (ref 100–199)
HDL: 82 mg/dL (ref >39)
LDL Chol Calc (NIH): 64 mg/dL (ref 0–99)
Triglycerides: 65 mg/dL (ref 0–149)
VLDL Cholesterol Cal: 13 mg/dL (ref 5–40)

## 2020-02-03 LAB — BASIC METABOLIC PANEL
BUN/Creatinine Ratio: 9 — ABNORMAL LOW (ref 12–28)
BUN: 9 mg/dL (ref 8–27)
CO2: 24 mmol/L (ref 20–29)
Calcium: 10 mg/dL (ref 8.7–10.3)
Chloride: 105 mmol/L (ref 96–106)
Creatinine, Ser: 1.04 mg/dL — ABNORMAL HIGH (ref 0.57–1.00)
GFR calc Af Amer: 60 mL/min/{1.73_m2} (ref >59)
GFR calc non Af Amer: 52 mL/min/{1.73_m2} — ABNORMAL LOW (ref >59)
Glucose: 91 mg/dL (ref 65–99)
Potassium: 3.9 mmol/L (ref 3.5–5.2)
Sodium: 142 mmol/L (ref 134–144)

## 2020-02-03 LAB — HEPATIC FUNCTION PANEL
ALT: 18 IU/L (ref 0–32)
AST: 14 IU/L (ref 0–40)
Albumin: 4.1 g/dL (ref 3.7–4.7)
Alkaline Phosphatase: 91 IU/L (ref 44–121)
Bilirubin Total: 0.2 mg/dL (ref 0.0–1.2)
Bilirubin, Direct: <0.1 mg/dL (ref 0.00–0.40)
Total Protein: 6.4 g/dL (ref 6.0–8.5)

## 2020-02-04 DIAGNOSIS — M7062 Trochanteric bursitis, left hip: Secondary | ICD-10-CM | POA: Diagnosis not present

## 2020-02-04 DIAGNOSIS — I10 Essential (primary) hypertension: Secondary | ICD-10-CM | POA: Diagnosis not present

## 2020-02-04 DIAGNOSIS — R270 Ataxia, unspecified: Secondary | ICD-10-CM | POA: Diagnosis not present

## 2020-02-04 DIAGNOSIS — E1140 Type 2 diabetes mellitus with diabetic neuropathy, unspecified: Secondary | ICD-10-CM | POA: Diagnosis not present

## 2020-02-04 DIAGNOSIS — F329 Major depressive disorder, single episode, unspecified: Secondary | ICD-10-CM | POA: Diagnosis not present

## 2020-02-04 DIAGNOSIS — I951 Orthostatic hypotension: Secondary | ICD-10-CM | POA: Diagnosis not present

## 2020-02-04 DIAGNOSIS — I2510 Atherosclerotic heart disease of native coronary artery without angina pectoris: Secondary | ICD-10-CM | POA: Diagnosis not present

## 2020-02-04 DIAGNOSIS — J449 Chronic obstructive pulmonary disease, unspecified: Secondary | ICD-10-CM | POA: Diagnosis not present

## 2020-02-04 DIAGNOSIS — F419 Anxiety disorder, unspecified: Secondary | ICD-10-CM | POA: Diagnosis not present

## 2020-02-05 DIAGNOSIS — M7062 Trochanteric bursitis, left hip: Secondary | ICD-10-CM | POA: Diagnosis not present

## 2020-02-05 DIAGNOSIS — Z6827 Body mass index (BMI) 27.0-27.9, adult: Secondary | ICD-10-CM | POA: Diagnosis not present

## 2020-02-05 DIAGNOSIS — E1140 Type 2 diabetes mellitus with diabetic neuropathy, unspecified: Secondary | ICD-10-CM | POA: Diagnosis not present

## 2020-02-05 DIAGNOSIS — I951 Orthostatic hypotension: Secondary | ICD-10-CM | POA: Diagnosis not present

## 2020-02-05 DIAGNOSIS — I2510 Atherosclerotic heart disease of native coronary artery without angina pectoris: Secondary | ICD-10-CM | POA: Diagnosis not present

## 2020-02-05 DIAGNOSIS — K219 Gastro-esophageal reflux disease without esophagitis: Secondary | ICD-10-CM | POA: Diagnosis not present

## 2020-02-05 DIAGNOSIS — R270 Ataxia, unspecified: Secondary | ICD-10-CM | POA: Diagnosis not present

## 2020-02-05 DIAGNOSIS — I10 Essential (primary) hypertension: Secondary | ICD-10-CM | POA: Diagnosis not present

## 2020-02-05 DIAGNOSIS — E559 Vitamin D deficiency, unspecified: Secondary | ICD-10-CM | POA: Diagnosis not present

## 2020-02-05 DIAGNOSIS — E663 Overweight: Secondary | ICD-10-CM | POA: Diagnosis not present

## 2020-02-05 DIAGNOSIS — J449 Chronic obstructive pulmonary disease, unspecified: Secondary | ICD-10-CM | POA: Diagnosis not present

## 2020-02-05 DIAGNOSIS — E785 Hyperlipidemia, unspecified: Secondary | ICD-10-CM | POA: Diagnosis not present

## 2020-02-05 DIAGNOSIS — F419 Anxiety disorder, unspecified: Secondary | ICD-10-CM | POA: Diagnosis not present

## 2020-02-05 DIAGNOSIS — F329 Major depressive disorder, single episode, unspecified: Secondary | ICD-10-CM | POA: Diagnosis not present

## 2020-02-07 DIAGNOSIS — I951 Orthostatic hypotension: Secondary | ICD-10-CM | POA: Diagnosis not present

## 2020-02-07 DIAGNOSIS — F419 Anxiety disorder, unspecified: Secondary | ICD-10-CM | POA: Diagnosis not present

## 2020-02-07 DIAGNOSIS — R270 Ataxia, unspecified: Secondary | ICD-10-CM | POA: Diagnosis not present

## 2020-02-07 DIAGNOSIS — M5440 Lumbago with sciatica, unspecified side: Secondary | ICD-10-CM | POA: Diagnosis not present

## 2020-02-07 DIAGNOSIS — M7062 Trochanteric bursitis, left hip: Secondary | ICD-10-CM | POA: Diagnosis not present

## 2020-02-07 DIAGNOSIS — E1140 Type 2 diabetes mellitus with diabetic neuropathy, unspecified: Secondary | ICD-10-CM | POA: Diagnosis not present

## 2020-02-07 DIAGNOSIS — M1990 Unspecified osteoarthritis, unspecified site: Secondary | ICD-10-CM | POA: Diagnosis not present

## 2020-02-07 DIAGNOSIS — J449 Chronic obstructive pulmonary disease, unspecified: Secondary | ICD-10-CM | POA: Diagnosis not present

## 2020-02-07 DIAGNOSIS — G5793 Unspecified mononeuropathy of bilateral lower limbs: Secondary | ICD-10-CM | POA: Diagnosis not present

## 2020-02-07 DIAGNOSIS — I2510 Atherosclerotic heart disease of native coronary artery without angina pectoris: Secondary | ICD-10-CM | POA: Diagnosis not present

## 2020-02-07 DIAGNOSIS — I10 Essential (primary) hypertension: Secondary | ICD-10-CM | POA: Diagnosis not present

## 2020-02-07 DIAGNOSIS — F329 Major depressive disorder, single episode, unspecified: Secondary | ICD-10-CM | POA: Diagnosis not present

## 2020-02-11 DIAGNOSIS — R270 Ataxia, unspecified: Secondary | ICD-10-CM | POA: Diagnosis not present

## 2020-02-11 DIAGNOSIS — I10 Essential (primary) hypertension: Secondary | ICD-10-CM | POA: Diagnosis not present

## 2020-02-11 DIAGNOSIS — M7062 Trochanteric bursitis, left hip: Secondary | ICD-10-CM | POA: Diagnosis not present

## 2020-02-11 DIAGNOSIS — I951 Orthostatic hypotension: Secondary | ICD-10-CM | POA: Diagnosis not present

## 2020-02-11 DIAGNOSIS — E1140 Type 2 diabetes mellitus with diabetic neuropathy, unspecified: Secondary | ICD-10-CM | POA: Diagnosis not present

## 2020-02-11 DIAGNOSIS — I2510 Atherosclerotic heart disease of native coronary artery without angina pectoris: Secondary | ICD-10-CM | POA: Diagnosis not present

## 2020-02-11 DIAGNOSIS — J449 Chronic obstructive pulmonary disease, unspecified: Secondary | ICD-10-CM | POA: Diagnosis not present

## 2020-02-11 DIAGNOSIS — F329 Major depressive disorder, single episode, unspecified: Secondary | ICD-10-CM | POA: Diagnosis not present

## 2020-02-11 DIAGNOSIS — F419 Anxiety disorder, unspecified: Secondary | ICD-10-CM | POA: Diagnosis not present

## 2020-02-13 ENCOUNTER — Telehealth: Payer: Self-pay | Admitting: Gastroenterology

## 2020-02-13 DIAGNOSIS — M4316 Spondylolisthesis, lumbar region: Secondary | ICD-10-CM | POA: Diagnosis not present

## 2020-02-13 DIAGNOSIS — I10 Essential (primary) hypertension: Secondary | ICD-10-CM | POA: Diagnosis not present

## 2020-02-13 DIAGNOSIS — J449 Chronic obstructive pulmonary disease, unspecified: Secondary | ICD-10-CM | POA: Diagnosis not present

## 2020-02-13 DIAGNOSIS — E1140 Type 2 diabetes mellitus with diabetic neuropathy, unspecified: Secondary | ICD-10-CM | POA: Diagnosis not present

## 2020-02-13 DIAGNOSIS — M545 Low back pain, unspecified: Secondary | ICD-10-CM | POA: Diagnosis not present

## 2020-02-13 DIAGNOSIS — G8929 Other chronic pain: Secondary | ICD-10-CM | POA: Diagnosis not present

## 2020-02-13 DIAGNOSIS — M4726 Other spondylosis with radiculopathy, lumbar region: Secondary | ICD-10-CM | POA: Diagnosis not present

## 2020-02-13 DIAGNOSIS — I2510 Atherosclerotic heart disease of native coronary artery without angina pectoris: Secondary | ICD-10-CM | POA: Diagnosis not present

## 2020-02-13 DIAGNOSIS — F329 Major depressive disorder, single episode, unspecified: Secondary | ICD-10-CM | POA: Diagnosis not present

## 2020-02-13 DIAGNOSIS — I951 Orthostatic hypotension: Secondary | ICD-10-CM | POA: Diagnosis not present

## 2020-02-13 DIAGNOSIS — M48062 Spinal stenosis, lumbar region with neurogenic claudication: Secondary | ICD-10-CM | POA: Diagnosis not present

## 2020-02-13 DIAGNOSIS — F419 Anxiety disorder, unspecified: Secondary | ICD-10-CM | POA: Diagnosis not present

## 2020-02-13 DIAGNOSIS — M7062 Trochanteric bursitis, left hip: Secondary | ICD-10-CM | POA: Diagnosis not present

## 2020-02-13 DIAGNOSIS — E088 Diabetes mellitus due to underlying condition with unspecified complications: Secondary | ICD-10-CM | POA: Diagnosis not present

## 2020-02-13 DIAGNOSIS — R270 Ataxia, unspecified: Secondary | ICD-10-CM | POA: Diagnosis not present

## 2020-02-13 NOTE — Telephone Encounter (Signed)
Requesting refill on Reglan

## 2020-02-13 NOTE — Telephone Encounter (Signed)
Just to Reglan 10 mg p.o. 4 times daily for 2 weeks as before Hold off on any refills Watch for any extrapyramidal side effects like tremors. RG

## 2020-02-13 NOTE — Telephone Encounter (Signed)
Would you like to refill this medication?  

## 2020-02-17 DIAGNOSIS — F419 Anxiety disorder, unspecified: Secondary | ICD-10-CM | POA: Diagnosis not present

## 2020-02-17 DIAGNOSIS — I2510 Atherosclerotic heart disease of native coronary artery without angina pectoris: Secondary | ICD-10-CM | POA: Diagnosis not present

## 2020-02-17 DIAGNOSIS — I10 Essential (primary) hypertension: Secondary | ICD-10-CM | POA: Diagnosis not present

## 2020-02-17 DIAGNOSIS — R270 Ataxia, unspecified: Secondary | ICD-10-CM | POA: Diagnosis not present

## 2020-02-17 DIAGNOSIS — E1140 Type 2 diabetes mellitus with diabetic neuropathy, unspecified: Secondary | ICD-10-CM | POA: Diagnosis not present

## 2020-02-17 DIAGNOSIS — J449 Chronic obstructive pulmonary disease, unspecified: Secondary | ICD-10-CM | POA: Diagnosis not present

## 2020-02-17 DIAGNOSIS — M7062 Trochanteric bursitis, left hip: Secondary | ICD-10-CM | POA: Diagnosis not present

## 2020-02-17 DIAGNOSIS — F329 Major depressive disorder, single episode, unspecified: Secondary | ICD-10-CM | POA: Diagnosis not present

## 2020-02-17 DIAGNOSIS — I951 Orthostatic hypotension: Secondary | ICD-10-CM | POA: Diagnosis not present

## 2020-02-17 MED ORDER — METOCLOPRAMIDE HCL 10 MG PO TABS: 10.0000 mg | ORAL_TABLET | Freq: Four times a day (QID) | ORAL | 0 refills | Status: DC

## 2020-02-17 NOTE — Telephone Encounter (Signed)
I have sent prescription to patient's pharmacy.  

## 2020-02-20 ENCOUNTER — Ambulatory Visit: Payer: Medicare HMO | Admitting: Cardiology

## 2020-02-23 DIAGNOSIS — M4316 Spondylolisthesis, lumbar region: Secondary | ICD-10-CM | POA: Diagnosis not present

## 2020-02-23 DIAGNOSIS — M5459 Other low back pain: Secondary | ICD-10-CM | POA: Diagnosis not present

## 2020-02-24 ENCOUNTER — Telehealth: Payer: Self-pay | Admitting: Gastroenterology

## 2020-02-24 NOTE — Telephone Encounter (Signed)
Pt is requesting a call back from a nurse to discuss her heart burn and indigestion, pt would like to know if Dr Lyndel Safe could prescribe something to her.

## 2020-02-26 DIAGNOSIS — E785 Hyperlipidemia, unspecified: Secondary | ICD-10-CM | POA: Diagnosis not present

## 2020-02-26 DIAGNOSIS — M48061 Spinal stenosis, lumbar region without neurogenic claudication: Secondary | ICD-10-CM | POA: Diagnosis not present

## 2020-02-26 DIAGNOSIS — M4316 Spondylolisthesis, lumbar region: Secondary | ICD-10-CM | POA: Diagnosis not present

## 2020-02-26 DIAGNOSIS — I10 Essential (primary) hypertension: Secondary | ICD-10-CM | POA: Diagnosis not present

## 2020-02-26 DIAGNOSIS — C451 Mesothelioma of peritoneum: Secondary | ICD-10-CM | POA: Diagnosis not present

## 2020-02-26 DIAGNOSIS — M5416 Radiculopathy, lumbar region: Secondary | ICD-10-CM | POA: Diagnosis not present

## 2020-02-26 DIAGNOSIS — E7800 Pure hypercholesterolemia, unspecified: Secondary | ICD-10-CM | POA: Diagnosis not present

## 2020-02-26 DIAGNOSIS — M4726 Other spondylosis with radiculopathy, lumbar region: Secondary | ICD-10-CM | POA: Diagnosis not present

## 2020-02-26 DIAGNOSIS — M48062 Spinal stenosis, lumbar region with neurogenic claudication: Secondary | ICD-10-CM | POA: Diagnosis not present

## 2020-02-26 DIAGNOSIS — M7138 Other bursal cyst, other site: Secondary | ICD-10-CM | POA: Diagnosis not present

## 2020-02-26 DIAGNOSIS — E1143 Type 2 diabetes mellitus with diabetic autonomic (poly)neuropathy: Secondary | ICD-10-CM | POA: Diagnosis not present

## 2020-02-26 DIAGNOSIS — J449 Chronic obstructive pulmonary disease, unspecified: Secondary | ICD-10-CM | POA: Diagnosis not present

## 2020-02-26 NOTE — Telephone Encounter (Signed)
I have returned patients call, unable to leave message.

## 2020-02-26 NOTE — Telephone Encounter (Signed)
Pt is requesting a call back from a nurse. 

## 2020-02-26 NOTE — Telephone Encounter (Signed)
Spoke to patient who reports having reflux and dysphagia symptoms. We went over her medication together and explained what she should be taking. Patient reports not following directions per her last office visit she will Continue Protonix 40mg  po bid, Famotidine 40mg  po qhs maintain a Gastroparesis diet. Continue Reglan 10mg  po QID 1/2hr QAC, QHS. Patient is also requested an appointment. Scheduled for next available.She will call us next week to report her symptoms.

## 2020-02-27 HISTORY — PX: OTHER SURGICAL HISTORY: SHX169

## 2020-02-29 ENCOUNTER — Other Ambulatory Visit: Payer: Self-pay | Admitting: Gastroenterology

## 2020-03-01 DIAGNOSIS — I951 Orthostatic hypotension: Secondary | ICD-10-CM | POA: Diagnosis not present

## 2020-03-01 DIAGNOSIS — F419 Anxiety disorder, unspecified: Secondary | ICD-10-CM | POA: Diagnosis not present

## 2020-03-01 DIAGNOSIS — I2510 Atherosclerotic heart disease of native coronary artery without angina pectoris: Secondary | ICD-10-CM | POA: Diagnosis not present

## 2020-03-01 DIAGNOSIS — R270 Ataxia, unspecified: Secondary | ICD-10-CM | POA: Diagnosis not present

## 2020-03-01 DIAGNOSIS — E1140 Type 2 diabetes mellitus with diabetic neuropathy, unspecified: Secondary | ICD-10-CM | POA: Diagnosis not present

## 2020-03-01 DIAGNOSIS — I10 Essential (primary) hypertension: Secondary | ICD-10-CM | POA: Diagnosis not present

## 2020-03-01 DIAGNOSIS — F329 Major depressive disorder, single episode, unspecified: Secondary | ICD-10-CM | POA: Diagnosis not present

## 2020-03-01 DIAGNOSIS — J449 Chronic obstructive pulmonary disease, unspecified: Secondary | ICD-10-CM | POA: Diagnosis not present

## 2020-03-01 DIAGNOSIS — M7062 Trochanteric bursitis, left hip: Secondary | ICD-10-CM | POA: Diagnosis not present

## 2020-03-02 ENCOUNTER — Telehealth: Payer: Self-pay | Admitting: Gastroenterology

## 2020-03-02 NOTE — Telephone Encounter (Signed)
Pt is requesting a refill on her linzess.

## 2020-03-03 DIAGNOSIS — R270 Ataxia, unspecified: Secondary | ICD-10-CM | POA: Diagnosis not present

## 2020-03-03 DIAGNOSIS — F329 Major depressive disorder, single episode, unspecified: Secondary | ICD-10-CM | POA: Diagnosis not present

## 2020-03-03 DIAGNOSIS — I2510 Atherosclerotic heart disease of native coronary artery without angina pectoris: Secondary | ICD-10-CM | POA: Diagnosis not present

## 2020-03-03 DIAGNOSIS — J449 Chronic obstructive pulmonary disease, unspecified: Secondary | ICD-10-CM | POA: Diagnosis not present

## 2020-03-03 DIAGNOSIS — E1140 Type 2 diabetes mellitus with diabetic neuropathy, unspecified: Secondary | ICD-10-CM | POA: Diagnosis not present

## 2020-03-03 DIAGNOSIS — F419 Anxiety disorder, unspecified: Secondary | ICD-10-CM | POA: Diagnosis not present

## 2020-03-03 DIAGNOSIS — I10 Essential (primary) hypertension: Secondary | ICD-10-CM | POA: Diagnosis not present

## 2020-03-03 DIAGNOSIS — I951 Orthostatic hypotension: Secondary | ICD-10-CM | POA: Diagnosis not present

## 2020-03-03 DIAGNOSIS — M7062 Trochanteric bursitis, left hip: Secondary | ICD-10-CM | POA: Diagnosis not present

## 2020-03-03 NOTE — Telephone Encounter (Signed)
Would you like to refill this? ?

## 2020-03-04 DIAGNOSIS — I951 Orthostatic hypotension: Secondary | ICD-10-CM | POA: Diagnosis not present

## 2020-03-04 DIAGNOSIS — F419 Anxiety disorder, unspecified: Secondary | ICD-10-CM | POA: Diagnosis not present

## 2020-03-04 DIAGNOSIS — J449 Chronic obstructive pulmonary disease, unspecified: Secondary | ICD-10-CM | POA: Diagnosis not present

## 2020-03-04 DIAGNOSIS — M7062 Trochanteric bursitis, left hip: Secondary | ICD-10-CM | POA: Diagnosis not present

## 2020-03-04 DIAGNOSIS — Z9889 Other specified postprocedural states: Secondary | ICD-10-CM | POA: Diagnosis not present

## 2020-03-04 DIAGNOSIS — F329 Major depressive disorder, single episode, unspecified: Secondary | ICD-10-CM | POA: Diagnosis not present

## 2020-03-04 DIAGNOSIS — E1140 Type 2 diabetes mellitus with diabetic neuropathy, unspecified: Secondary | ICD-10-CM | POA: Diagnosis not present

## 2020-03-04 DIAGNOSIS — I10 Essential (primary) hypertension: Secondary | ICD-10-CM | POA: Diagnosis not present

## 2020-03-04 DIAGNOSIS — Z6828 Body mass index (BMI) 28.0-28.9, adult: Secondary | ICD-10-CM | POA: Diagnosis not present

## 2020-03-04 DIAGNOSIS — I2510 Atherosclerotic heart disease of native coronary artery without angina pectoris: Secondary | ICD-10-CM | POA: Diagnosis not present

## 2020-03-04 DIAGNOSIS — R270 Ataxia, unspecified: Secondary | ICD-10-CM | POA: Diagnosis not present

## 2020-03-04 DIAGNOSIS — Z09 Encounter for follow-up examination after completed treatment for conditions other than malignant neoplasm: Secondary | ICD-10-CM | POA: Diagnosis not present

## 2020-03-04 DIAGNOSIS — K5909 Other constipation: Secondary | ICD-10-CM | POA: Diagnosis not present

## 2020-03-04 DIAGNOSIS — Z6827 Body mass index (BMI) 27.0-27.9, adult: Secondary | ICD-10-CM | POA: Diagnosis not present

## 2020-03-05 DIAGNOSIS — F419 Anxiety disorder, unspecified: Secondary | ICD-10-CM | POA: Diagnosis not present

## 2020-03-05 DIAGNOSIS — I10 Essential (primary) hypertension: Secondary | ICD-10-CM | POA: Diagnosis not present

## 2020-03-05 DIAGNOSIS — I951 Orthostatic hypotension: Secondary | ICD-10-CM | POA: Diagnosis not present

## 2020-03-05 DIAGNOSIS — R270 Ataxia, unspecified: Secondary | ICD-10-CM | POA: Diagnosis not present

## 2020-03-05 DIAGNOSIS — M48062 Spinal stenosis, lumbar region with neurogenic claudication: Secondary | ICD-10-CM | POA: Diagnosis not present

## 2020-03-05 DIAGNOSIS — F329 Major depressive disorder, single episode, unspecified: Secondary | ICD-10-CM | POA: Diagnosis not present

## 2020-03-05 DIAGNOSIS — J449 Chronic obstructive pulmonary disease, unspecified: Secondary | ICD-10-CM | POA: Diagnosis not present

## 2020-03-05 DIAGNOSIS — I2510 Atherosclerotic heart disease of native coronary artery without angina pectoris: Secondary | ICD-10-CM | POA: Diagnosis not present

## 2020-03-05 DIAGNOSIS — Z48811 Encounter for surgical aftercare following surgery on the nervous system: Secondary | ICD-10-CM | POA: Diagnosis not present

## 2020-03-05 DIAGNOSIS — M7062 Trochanteric bursitis, left hip: Secondary | ICD-10-CM | POA: Diagnosis not present

## 2020-03-05 DIAGNOSIS — E1140 Type 2 diabetes mellitus with diabetic neuropathy, unspecified: Secondary | ICD-10-CM | POA: Diagnosis not present

## 2020-03-05 MED ORDER — LINACLOTIDE 72 MCG PO CAPS: 72.0000 ug | ORAL_CAPSULE | Freq: Every day | ORAL | 11 refills | Status: DC

## 2020-03-05 NOTE — Telephone Encounter (Signed)
I have sent prescription to patient's pharmacy.  

## 2020-03-05 NOTE — Telephone Encounter (Signed)
Please do Same dose 11 refills.  RG

## 2020-03-09 DIAGNOSIS — I2510 Atherosclerotic heart disease of native coronary artery without angina pectoris: Secondary | ICD-10-CM | POA: Diagnosis not present

## 2020-03-09 DIAGNOSIS — G5793 Unspecified mononeuropathy of bilateral lower limbs: Secondary | ICD-10-CM | POA: Diagnosis not present

## 2020-03-09 DIAGNOSIS — M1990 Unspecified osteoarthritis, unspecified site: Secondary | ICD-10-CM | POA: Diagnosis not present

## 2020-03-09 DIAGNOSIS — M5440 Lumbago with sciatica, unspecified side: Secondary | ICD-10-CM | POA: Diagnosis not present

## 2020-03-10 DIAGNOSIS — F329 Major depressive disorder, single episode, unspecified: Secondary | ICD-10-CM | POA: Diagnosis not present

## 2020-03-10 DIAGNOSIS — F419 Anxiety disorder, unspecified: Secondary | ICD-10-CM | POA: Diagnosis not present

## 2020-03-10 DIAGNOSIS — I10 Essential (primary) hypertension: Secondary | ICD-10-CM | POA: Diagnosis not present

## 2020-03-10 DIAGNOSIS — R270 Ataxia, unspecified: Secondary | ICD-10-CM | POA: Diagnosis not present

## 2020-03-10 DIAGNOSIS — M7062 Trochanteric bursitis, left hip: Secondary | ICD-10-CM | POA: Diagnosis not present

## 2020-03-10 DIAGNOSIS — E1140 Type 2 diabetes mellitus with diabetic neuropathy, unspecified: Secondary | ICD-10-CM | POA: Diagnosis not present

## 2020-03-10 DIAGNOSIS — I951 Orthostatic hypotension: Secondary | ICD-10-CM | POA: Diagnosis not present

## 2020-03-10 DIAGNOSIS — M48062 Spinal stenosis, lumbar region with neurogenic claudication: Secondary | ICD-10-CM | POA: Diagnosis not present

## 2020-03-10 DIAGNOSIS — Z48811 Encounter for surgical aftercare following surgery on the nervous system: Secondary | ICD-10-CM | POA: Diagnosis not present

## 2020-03-11 DIAGNOSIS — Z981 Arthrodesis status: Secondary | ICD-10-CM | POA: Diagnosis not present

## 2020-03-11 DIAGNOSIS — Z4889 Encounter for other specified surgical aftercare: Secondary | ICD-10-CM | POA: Diagnosis not present

## 2020-03-11 DIAGNOSIS — Z4802 Encounter for removal of sutures: Secondary | ICD-10-CM | POA: Diagnosis not present

## 2020-03-12 DIAGNOSIS — N644 Mastodynia: Secondary | ICD-10-CM | POA: Diagnosis not present

## 2020-03-12 DIAGNOSIS — Z9889 Other specified postprocedural states: Secondary | ICD-10-CM | POA: Diagnosis not present

## 2020-03-12 DIAGNOSIS — G4700 Insomnia, unspecified: Secondary | ICD-10-CM | POA: Diagnosis not present

## 2020-03-12 DIAGNOSIS — Z23 Encounter for immunization: Secondary | ICD-10-CM | POA: Diagnosis not present

## 2020-03-17 DIAGNOSIS — F419 Anxiety disorder, unspecified: Secondary | ICD-10-CM | POA: Diagnosis not present

## 2020-03-17 DIAGNOSIS — R270 Ataxia, unspecified: Secondary | ICD-10-CM | POA: Diagnosis not present

## 2020-03-17 DIAGNOSIS — M7062 Trochanteric bursitis, left hip: Secondary | ICD-10-CM | POA: Diagnosis not present

## 2020-03-17 DIAGNOSIS — M48062 Spinal stenosis, lumbar region with neurogenic claudication: Secondary | ICD-10-CM | POA: Diagnosis not present

## 2020-03-17 DIAGNOSIS — F329 Major depressive disorder, single episode, unspecified: Secondary | ICD-10-CM | POA: Diagnosis not present

## 2020-03-17 DIAGNOSIS — I10 Essential (primary) hypertension: Secondary | ICD-10-CM | POA: Diagnosis not present

## 2020-03-17 DIAGNOSIS — I951 Orthostatic hypotension: Secondary | ICD-10-CM | POA: Diagnosis not present

## 2020-03-17 DIAGNOSIS — E1140 Type 2 diabetes mellitus with diabetic neuropathy, unspecified: Secondary | ICD-10-CM | POA: Diagnosis not present

## 2020-03-17 DIAGNOSIS — Z48811 Encounter for surgical aftercare following surgery on the nervous system: Secondary | ICD-10-CM | POA: Diagnosis not present

## 2020-03-18 ENCOUNTER — Other Ambulatory Visit: Payer: Self-pay | Admitting: Cardiology

## 2020-03-18 DIAGNOSIS — F419 Anxiety disorder, unspecified: Secondary | ICD-10-CM | POA: Diagnosis not present

## 2020-03-18 DIAGNOSIS — M48062 Spinal stenosis, lumbar region with neurogenic claudication: Secondary | ICD-10-CM | POA: Diagnosis not present

## 2020-03-18 DIAGNOSIS — F329 Major depressive disorder, single episode, unspecified: Secondary | ICD-10-CM | POA: Diagnosis not present

## 2020-03-18 DIAGNOSIS — M7062 Trochanteric bursitis, left hip: Secondary | ICD-10-CM | POA: Diagnosis not present

## 2020-03-18 DIAGNOSIS — E088 Diabetes mellitus due to underlying condition with unspecified complications: Secondary | ICD-10-CM

## 2020-03-18 DIAGNOSIS — R270 Ataxia, unspecified: Secondary | ICD-10-CM | POA: Diagnosis not present

## 2020-03-18 DIAGNOSIS — E1140 Type 2 diabetes mellitus with diabetic neuropathy, unspecified: Secondary | ICD-10-CM | POA: Diagnosis not present

## 2020-03-18 DIAGNOSIS — I10 Essential (primary) hypertension: Secondary | ICD-10-CM | POA: Diagnosis not present

## 2020-03-18 DIAGNOSIS — I951 Orthostatic hypotension: Secondary | ICD-10-CM | POA: Diagnosis not present

## 2020-03-18 DIAGNOSIS — Z48811 Encounter for surgical aftercare following surgery on the nervous system: Secondary | ICD-10-CM | POA: Diagnosis not present

## 2020-03-18 DIAGNOSIS — I2510 Atherosclerotic heart disease of native coronary artery without angina pectoris: Secondary | ICD-10-CM

## 2020-03-18 MED ORDER — ROSUVASTATIN CALCIUM 20 MG PO TABS: 20.0000 mg | ORAL_TABLET | Freq: Every day | ORAL | 2 refills | Status: DC

## 2020-03-18 NOTE — Telephone Encounter (Signed)
°*  STAT* If patient is at the pharmacy, call can be transferred to refill team.   1. Which medications need to be refilled? (please list name of each medication and dose if known) rosuvastatin (CRESTOR) 20 MG tablet(Expired)  2. Which pharmacy/location (including street and city if local pharmacy) is medication to be sent to? [place], [place]  3. Do they need a 30 day or 90 day supply?  90 day   Patient requesting refill - prescription expired. Pt last saw Dr. Geraldo Pitter in august of 2021. If pt needs to be seen prior to being refilled please call/advise.   Thank you!

## 2020-03-18 NOTE — Telephone Encounter (Signed)
Refill for Rosuvastatin to [facility].

## 2020-03-25 DIAGNOSIS — F419 Anxiety disorder, unspecified: Secondary | ICD-10-CM | POA: Diagnosis not present

## 2020-03-25 DIAGNOSIS — M48062 Spinal stenosis, lumbar region with neurogenic claudication: Secondary | ICD-10-CM | POA: Diagnosis not present

## 2020-03-25 DIAGNOSIS — R270 Ataxia, unspecified: Secondary | ICD-10-CM | POA: Diagnosis not present

## 2020-03-25 DIAGNOSIS — I951 Orthostatic hypotension: Secondary | ICD-10-CM | POA: Diagnosis not present

## 2020-03-25 DIAGNOSIS — I10 Essential (primary) hypertension: Secondary | ICD-10-CM | POA: Diagnosis not present

## 2020-03-25 DIAGNOSIS — F329 Major depressive disorder, single episode, unspecified: Secondary | ICD-10-CM | POA: Diagnosis not present

## 2020-03-25 DIAGNOSIS — M7062 Trochanteric bursitis, left hip: Secondary | ICD-10-CM | POA: Diagnosis not present

## 2020-03-25 DIAGNOSIS — M4316 Spondylolisthesis, lumbar region: Secondary | ICD-10-CM | POA: Diagnosis not present

## 2020-03-25 DIAGNOSIS — Z48811 Encounter for surgical aftercare following surgery on the nervous system: Secondary | ICD-10-CM | POA: Diagnosis not present

## 2020-03-25 DIAGNOSIS — M5459 Other low back pain: Secondary | ICD-10-CM | POA: Diagnosis not present

## 2020-03-25 DIAGNOSIS — E1140 Type 2 diabetes mellitus with diabetic neuropathy, unspecified: Secondary | ICD-10-CM | POA: Diagnosis not present

## 2020-03-26 DIAGNOSIS — Z981 Arthrodesis status: Secondary | ICD-10-CM

## 2020-03-26 DIAGNOSIS — Z4789 Encounter for other orthopedic aftercare: Secondary | ICD-10-CM | POA: Diagnosis not present

## 2020-03-26 DIAGNOSIS — Z4889 Encounter for other specified surgical aftercare: Secondary | ICD-10-CM

## 2020-03-26 HISTORY — DX: Arthrodesis status: Z98.1

## 2020-03-26 HISTORY — DX: Encounter for other specified surgical aftercare: Z48.89

## 2020-04-01 DIAGNOSIS — M7062 Trochanteric bursitis, left hip: Secondary | ICD-10-CM | POA: Diagnosis not present

## 2020-04-01 DIAGNOSIS — R270 Ataxia, unspecified: Secondary | ICD-10-CM | POA: Diagnosis not present

## 2020-04-01 DIAGNOSIS — M48062 Spinal stenosis, lumbar region with neurogenic claudication: Secondary | ICD-10-CM | POA: Diagnosis not present

## 2020-04-01 DIAGNOSIS — I951 Orthostatic hypotension: Secondary | ICD-10-CM | POA: Diagnosis not present

## 2020-04-01 DIAGNOSIS — F419 Anxiety disorder, unspecified: Secondary | ICD-10-CM | POA: Diagnosis not present

## 2020-04-01 DIAGNOSIS — I10 Essential (primary) hypertension: Secondary | ICD-10-CM | POA: Diagnosis not present

## 2020-04-01 DIAGNOSIS — E1140 Type 2 diabetes mellitus with diabetic neuropathy, unspecified: Secondary | ICD-10-CM | POA: Diagnosis not present

## 2020-04-01 DIAGNOSIS — F329 Major depressive disorder, single episode, unspecified: Secondary | ICD-10-CM | POA: Diagnosis not present

## 2020-04-01 DIAGNOSIS — Z48811 Encounter for surgical aftercare following surgery on the nervous system: Secondary | ICD-10-CM | POA: Diagnosis not present

## 2020-04-04 DIAGNOSIS — M48062 Spinal stenosis, lumbar region with neurogenic claudication: Secondary | ICD-10-CM | POA: Diagnosis not present

## 2020-04-04 DIAGNOSIS — F329 Major depressive disorder, single episode, unspecified: Secondary | ICD-10-CM | POA: Diagnosis not present

## 2020-04-04 DIAGNOSIS — F419 Anxiety disorder, unspecified: Secondary | ICD-10-CM | POA: Diagnosis not present

## 2020-04-04 DIAGNOSIS — Z48811 Encounter for surgical aftercare following surgery on the nervous system: Secondary | ICD-10-CM | POA: Diagnosis not present

## 2020-04-04 DIAGNOSIS — R270 Ataxia, unspecified: Secondary | ICD-10-CM | POA: Diagnosis not present

## 2020-04-04 DIAGNOSIS — I10 Essential (primary) hypertension: Secondary | ICD-10-CM | POA: Diagnosis not present

## 2020-04-04 DIAGNOSIS — I951 Orthostatic hypotension: Secondary | ICD-10-CM | POA: Diagnosis not present

## 2020-04-04 DIAGNOSIS — E1140 Type 2 diabetes mellitus with diabetic neuropathy, unspecified: Secondary | ICD-10-CM | POA: Diagnosis not present

## 2020-04-04 DIAGNOSIS — M7062 Trochanteric bursitis, left hip: Secondary | ICD-10-CM | POA: Diagnosis not present

## 2020-04-07 DIAGNOSIS — M48062 Spinal stenosis, lumbar region with neurogenic claudication: Secondary | ICD-10-CM | POA: Diagnosis not present

## 2020-04-07 DIAGNOSIS — F419 Anxiety disorder, unspecified: Secondary | ICD-10-CM | POA: Diagnosis not present

## 2020-04-07 DIAGNOSIS — I10 Essential (primary) hypertension: Secondary | ICD-10-CM | POA: Diagnosis not present

## 2020-04-07 DIAGNOSIS — F329 Major depressive disorder, single episode, unspecified: Secondary | ICD-10-CM | POA: Diagnosis not present

## 2020-04-07 DIAGNOSIS — M7062 Trochanteric bursitis, left hip: Secondary | ICD-10-CM | POA: Diagnosis not present

## 2020-04-07 DIAGNOSIS — Z48811 Encounter for surgical aftercare following surgery on the nervous system: Secondary | ICD-10-CM | POA: Diagnosis not present

## 2020-04-07 DIAGNOSIS — I951 Orthostatic hypotension: Secondary | ICD-10-CM | POA: Diagnosis not present

## 2020-04-07 DIAGNOSIS — R270 Ataxia, unspecified: Secondary | ICD-10-CM | POA: Diagnosis not present

## 2020-04-07 DIAGNOSIS — E1140 Type 2 diabetes mellitus with diabetic neuropathy, unspecified: Secondary | ICD-10-CM | POA: Diagnosis not present

## 2020-04-08 DIAGNOSIS — I2510 Atherosclerotic heart disease of native coronary artery without angina pectoris: Secondary | ICD-10-CM | POA: Diagnosis not present

## 2020-04-08 DIAGNOSIS — Z48811 Encounter for surgical aftercare following surgery on the nervous system: Secondary | ICD-10-CM | POA: Diagnosis not present

## 2020-04-08 DIAGNOSIS — R270 Ataxia, unspecified: Secondary | ICD-10-CM | POA: Diagnosis not present

## 2020-04-08 DIAGNOSIS — M48062 Spinal stenosis, lumbar region with neurogenic claudication: Secondary | ICD-10-CM | POA: Diagnosis not present

## 2020-04-08 DIAGNOSIS — G5793 Unspecified mononeuropathy of bilateral lower limbs: Secondary | ICD-10-CM | POA: Diagnosis not present

## 2020-04-08 DIAGNOSIS — M1990 Unspecified osteoarthritis, unspecified site: Secondary | ICD-10-CM | POA: Diagnosis not present

## 2020-04-08 DIAGNOSIS — I951 Orthostatic hypotension: Secondary | ICD-10-CM | POA: Diagnosis not present

## 2020-04-08 DIAGNOSIS — F329 Major depressive disorder, single episode, unspecified: Secondary | ICD-10-CM | POA: Diagnosis not present

## 2020-04-08 DIAGNOSIS — E1140 Type 2 diabetes mellitus with diabetic neuropathy, unspecified: Secondary | ICD-10-CM | POA: Diagnosis not present

## 2020-04-08 DIAGNOSIS — I10 Essential (primary) hypertension: Secondary | ICD-10-CM | POA: Diagnosis not present

## 2020-04-08 DIAGNOSIS — F419 Anxiety disorder, unspecified: Secondary | ICD-10-CM | POA: Diagnosis not present

## 2020-04-08 DIAGNOSIS — M5440 Lumbago with sciatica, unspecified side: Secondary | ICD-10-CM | POA: Diagnosis not present

## 2020-04-08 DIAGNOSIS — M7062 Trochanteric bursitis, left hip: Secondary | ICD-10-CM | POA: Diagnosis not present

## 2020-04-10 NOTE — Progress Notes (Signed)
NEUROLOGY FOLLOW UP OFFICE NOTE  Deborah Hutchinson [id number]   Subjective:  Deborah Hutchinson is a 74 year old woman with iron-deficiency anemia, type 2 diabetes mellitus, CAD, orthostatic hypotension, hypertension who follows up for headache.  She is accompanied by her daughter who supplements history.  UPDATE: Last seen in June 2021.  At that time, she said her headaches improved after she was taken off of metformin.  Since last visit, she underwent a transforaminal lumbar interbody fusion at L4-5 on 02/26/2020 for lumbar spinal stenosis.  Headaches returned about 5 months ago.  Again associated with episodes of dyspnea.  5 minutes 3 times a week.    Current NSAIDS:Celebrex (not daily) Current analgesics:none Current triptans:none Current ergotamine:none Current anti-emetic:none Current muscle relaxants:none Current anti-anxiolytic:none Current sleep aide:none Current Antihypertensive medications:Imdur, hydralazine, Norvasc, HCTZ Current Antidepressant medications:Lexapro 10mg , Wellbutrin Current Anticonvulsant medications:none Current anti-CGRP:none Current Vitamins/Herbal/Supplements:none Current Antihistamines/Decongestants:none Other therapy:none Hormone/birth control:none  HISTORY: She began having headaches in 2017-2018. They only occur when she has episodes of dyspnea. She suddenly feels short of breath and lightheaded but doesn't pass out. It is not exertional and happens at rest. It does not occur out of sleep. She has asthma and chronic bronchitis. When these episodes occur, she develops 10/10 right sided stabbing headache. She has associated weakness but denies associated neck pain,nausea, vomiting, photophobia, phonophobia, visual disturbance or unilateral numbness or weakness. It typically lasts 5 minutes, about as long as the shortness of breath until she uses her inhaler. Frequency varies. She may have a week without  episodes or twice a week or some weeks it may occur several times. She also has longstanding history of tinnitus, which she describes as sounding like "bees" in her head. She was evaluated by ENT in April 2019. Hearing test and tympanogram were unremarkable.When she gets these headaches, the ringing sounds louder. She denies prior history of headaches.  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  No aneurysm.  Due to the severe right cavernous carotid stenosis,, ASA was switched to Plavix, which was first discussed with her cardiologist, Dr. Geraldo Pitter.    Past medications:  Gabapentin (hallucinations), tramadol,   PAST MEDICAL HISTORY: Past Medical History:  Diagnosis Date  . Angina    normal stress test  . Anxiety   . Arthritis   . Asthma   . Bronchitis   . CAD (coronary artery disease) 02/13/2019  . Chest pain 05/08/2019  . Chest pain of uncertain etiology   . Chronic bilateral low back pain without sciatica 07/11/2019   Formatting of this note might be different from the original. Added automatically from request for surgery [id number]  . COPD (chronic obstructive pulmonary disease) ([place]) 02/09/2017  . 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 Diabetes mellitus   . Diabetes mellitus due to underlying condition with unspecified complications ([place]) 08/14/2498  . Dyslipidemia 06/10/2015  . Essential hypertension 06/10/2015  . GERD (gastroesophageal reflux disease)   . H/O hiatal hernia   . Headache 09/24/2018  . Heart murmur   . Hyperlipemia   . Hypertension   . Mixed dyslipidemia 06/10/2015  . Moderate aortic stenosis 05/16/2019  . Nonspecific (abnormal) findings on radiological and other examination of gastrointestinal tract 05/19/2011  . OSA (obstructive sleep apnea) 02/09/2017  . Pain  and swelling of toe of right foot 07/15/2016  .  Palpitations 06/02/2016  . Shortness of breath    on  excertion  . Sleep apnea     MEDICATIONS: Current Outpatient Medications on File Prior to Visit  Medication Sig Dispense Refill  . albuterol (PROVENTIL HFA;VENTOLIN HFA) 108 (90 Base) MCG/ACT inhaler Inhale 2 puffs into the lungs every 6 (six) hours as needed for wheezing or shortness of breath. 3 Inhaler 3  . amLODipine (NORVASC) 10 MG tablet Take 10 mg by mouth daily.    . budesonide-formoterol (SYMBICORT) 160-4.5 MCG/ACT inhaler Inhale 2 puffs into the lungs 2 (two) times daily. 3 Inhaler 3  . buPROPion (WELLBUTRIN XL) 300 MG 24 hr tablet Take 300 mg by mouth daily.     . canagliflozin (INVOKANA) 300 MG TABS tablet Take 300 mg by mouth daily.    . celecoxib (CELEBREX) 100 MG capsule Take 100-200 mg by mouth daily.    . cetirizine (ZYRTEC) 10 MG tablet Take 1 tablet by mouth at bedtime.    . Cholecalciferol (VITAMIN D PO) Take 5,000 Units by mouth every 30 (thirty) days.     . Cholecalciferol (VITAMIN D3) 100000 UNIT/GM POWD Take 5,000 Units by mouth every 30 (thirty) days.    . clopidogrel (PLAVIX) 75 MG tablet Take 1 tablet (75 mg total) by mouth daily. 90 tablet 2  . diclofenac Sodium (VOLTAREN) 1 % GEL Apply 4 g topically as needed.    . dicyclomine (BENTYL) 10 MG capsule Take 10 mg by mouth 2 (two) times daily as needed.    . doxycycline (VIBRA-TABS) 100 MG tablet Take 100 mg by mouth daily.    . ergocalciferol (VITAMIN D2) 1.25 MG (50000 UT) capsule Take 50,000 Units by mouth daily.    Marland Kitchen escitalopram (LEXAPRO) 10 MG tablet Take 10 mg by mouth daily.    . famotidine (PEPCID) 20 MG tablet Take 20 mg by mouth in the morning and at bedtime.    . fluticasone (FLONASE) 50 MCG/ACT nasal spray Place 2 sprays into both nostrils as needed.    . fluticasone (VERAMYST) 27.5 MCG/SPRAY nasal spray Place 2 sprays into the nose daily.    Marland Kitchen gabapentin (NEURONTIN) 300 MG capsule Take 300 mg by mouth  as needed.    . hydrALAZINE (APRESOLINE) 50 MG tablet Take 1 tablet (50 mg total) by mouth 3 (three) times daily. 180 tablet 2  . hydrochlorothiazide (HYDRODIURIL) 25 MG tablet TAKE 1 TABLET EVERY DAY 90 tablet 3  . isosorbide mononitrate (IMDUR) 120 MG 24 hr tablet Take 1 tablet (120 mg total) by mouth daily. 60 tablet 2  . linaclotide (LINZESS) 72 MCG capsule Take 72 mcg by mouth as needed.    . linaclotide (LINZESS) 72 MCG capsule Take 1 capsule (72 mcg total) by mouth daily before breakfast. 30 capsule 11  . liraglutide (VICTOZA) 18 MG/3ML SOPN Inject 1.8 mg into the skin daily.    Marland Kitchen losartan (COZAAR) 100 MG tablet Take 1 tablet (100 mg total) by mouth daily. 60 tablet 2  . metoCLOPramide (REGLAN) 10 MG tablet Take 1 tablet (10 mg total) by mouth 4 (four) times daily. X 2weeks 56 tablet 0  . montelukast (SINGULAIR) 10 MG tablet Take 10 mg by mouth at bedtime.     . Multiple Vitamin (MULTIVITAMIN) capsule Take 1 capsule by mouth daily.      . nitroGLYCERIN (NITROSTAT) 0.4 MG SL tablet DISSOLVE ONE TABLET UNDER THE TONGUE EVERY 5 MINUTES AS NEEDED FOR CHEST PAIN.  DO NOT EXCEED A TOTAL OF 3 DOSES IN  15 MINUTES 25 tablet 5  . oxybutynin (DITROPAN) 5 MG tablet Take 10 mg by mouth daily.    . pantoprazole (PROTONIX) 40 MG tablet Take 1 tablet (40 mg total) by mouth 2 (two) times daily. 60 tablet 6  . Potassium Chloride CR (MICRO-K) 8 MEQ CPCR capsule CR TAKE 1 CAPSULE BY MOUTH ONCE DAILY FOR LOW POTASSIUM    . rosuvastatin (CRESTOR) 20 MG tablet Take 1 tablet (20 mg total) by mouth daily. 90 tablet 2  . tiotropium (SPIRIVA) 18 MCG inhalation capsule Place 18 mcg into inhaler and inhale daily.     No current facility-administered medications on file prior to visit.    ALLERGIES: Allergies  Allergen Reactions  . Penicillins Rash    Reaction was 30years ago  Has never taken again Reaction was 30years ago Has never taken again Has patient had a PCN reaction causing anaphylaxis, immediate rash,  facial/tongue/throat swelling, SOB or lightheadedness with hypotension? no Has patient had a PCN reaction causing severe rash involving mucus membranes or skin necrosis? no Has patient had a PCN reaction that required hospitilization?no  Has patient had a PCN reaction occurring within the last 10 years? no If all of the above answers are "no" then may proceed with cephalosporin use  . Gabapentin Other (See Comments)    Hullination   Hallucinations   . Tramadol Other (See Comments)    Hallunications  Hallucinations     FAMILY HISTORY: Family History  Problem Relation Age of Onset  . Diabetes Sister   . Hypertension Daughter   . Colon cancer Neg Hx     SOCIAL HISTORY: Social History   Socioeconomic History  . Marital status: Single    Spouse name: Not on file  . Number of children: 3  . Years of education: Not on file  . Highest education level: Not on file  Occupational History  . Occupation: retired  Tobacco Use  . Smoking status: Former Smoker    Packs/day: 1.00    Quit date: 05/17/2011    Years since quitting: 8.9  . Smokeless tobacco: Never Used  Vaping Use  . Vaping Use: Never used  Substance and Sexual Activity  . Alcohol use: No  . Drug use: No  . Sexual activity: Never  Other Topics Concern  . Not on file  Social History Narrative   Right handed   Social Determinants of Health   Financial Resource Strain:   . Difficulty of Paying Living Expenses: Not on file  Food Insecurity:   . Worried About Charity fundraiser in the Last Year: Not on file  . Ran Out of Food in the Last Year: Not on file  Transportation Needs:   . Lack of Transportation (Medical): Not on file  . Lack of Transportation (Non-Medical): Not on file  Physical Activity:   . Days of Exercise per Week: Not on file  . Minutes of Exercise per Session: Not on file  Stress:   . Feeling of Stress : Not on file  Social Connections:   . Frequency of Communication with Friends and Family:  Not on file  . Frequency of Social Gatherings with Friends and Family: Not on file  . Attends Religious Services: Not on file  . Active Member of Clubs or Organizations: Not on file  . Attends Archivist Meetings: Not on file  . Marital Status: Not on file  Intimate Partner Violence:   . Fear of Current or Ex-Partner: Not on file  .  Emotionally Abused: Not on file  . Physically Abused: Not on file  . Sexually Abused: Not on file     Objective:  Blood pressure 126/81, pulse 70, height 5\' 4"  (1.626 m), weight 158 lb (71.7 kg), SpO2 95 %. General: No acute distress.  Patient appears well-groomed.   Head:  Normocephalic/atraumatic Eyes:  Fundi examined but not visualized Neck: supple, no paraspinal tenderness, full range of motion Heart:  Regular rate and rhythm Lungs:  Clear to auscultation bilaterally Back: No paraspinal tenderness Neurological Exam: alert and oriented to person, place, and time. Attention span and concentration intact, recent and remote memory intact, fund of knowledge intact.  Speech fluent and not dysarthric, language intact.  CN II-XII intact. Bulk and tone normal, muscle strength 5/5 throughout.  Sensation to light touch, temperature and vibration intact.  Deep tendon reflexes 2+ throughout, toes downgoing.  Finger to nose and heel to shin testing intact.  Gait normal, Romberg negative.   Assessment/Plan:   Headache occurring with dyspnea.  I am unclear of the etiology or class of headache.  The closest thing it correlates with is a primary exertional headache.  However, medication management primarily includes propranolol (which is contraindicated with her asthma) or daily indomethacin (which would not be the best option in setting of Plavix).  I would not use an antidepressant such as nortriptyline as she is already on 2 other antidepressants.  She has already had an adverse reaction to gabapentin.  Unfortunately, I do not have any other recommendations for  treatment.  It may be helpful to find out how to best suppress her episodes of dyspnea.    Metta Clines, DO  CC: [name], MD

## 2020-04-13 ENCOUNTER — Other Ambulatory Visit: Payer: Self-pay

## 2020-04-13 ENCOUNTER — Encounter: Payer: Self-pay | Admitting: Neurology

## 2020-04-13 ENCOUNTER — Ambulatory Visit (INDEPENDENT_AMBULATORY_CARE_PROVIDER_SITE_OTHER): Payer: Medicare HMO | Admitting: Neurology

## 2020-04-13 VITALS — BP 126/81 | HR 70 | Ht 64.0 in | Wt 158.0 lb

## 2020-04-13 DIAGNOSIS — R519 Headache, unspecified: Secondary | ICD-10-CM | POA: Diagnosis not present

## 2020-04-13 NOTE — Patient Instructions (Signed)
I don't have a specific answer to treat your headache.  I think trying to find out how you can prevent shortness of breath would be helpful.

## 2020-04-15 ENCOUNTER — Ambulatory Visit: Payer: Medicare HMO | Admitting: Gastroenterology

## 2020-04-15 DIAGNOSIS — F419 Anxiety disorder, unspecified: Secondary | ICD-10-CM | POA: Diagnosis not present

## 2020-04-15 DIAGNOSIS — I951 Orthostatic hypotension: Secondary | ICD-10-CM | POA: Diagnosis not present

## 2020-04-15 DIAGNOSIS — R270 Ataxia, unspecified: Secondary | ICD-10-CM | POA: Diagnosis not present

## 2020-04-15 DIAGNOSIS — M7062 Trochanteric bursitis, left hip: Secondary | ICD-10-CM | POA: Diagnosis not present

## 2020-04-15 DIAGNOSIS — Z48811 Encounter for surgical aftercare following surgery on the nervous system: Secondary | ICD-10-CM | POA: Diagnosis not present

## 2020-04-15 DIAGNOSIS — E1140 Type 2 diabetes mellitus with diabetic neuropathy, unspecified: Secondary | ICD-10-CM | POA: Diagnosis not present

## 2020-04-15 DIAGNOSIS — F329 Major depressive disorder, single episode, unspecified: Secondary | ICD-10-CM | POA: Diagnosis not present

## 2020-04-15 DIAGNOSIS — M48062 Spinal stenosis, lumbar region with neurogenic claudication: Secondary | ICD-10-CM | POA: Diagnosis not present

## 2020-04-15 DIAGNOSIS — I10 Essential (primary) hypertension: Secondary | ICD-10-CM | POA: Diagnosis not present

## 2020-04-16 DIAGNOSIS — R270 Ataxia, unspecified: Secondary | ICD-10-CM | POA: Diagnosis not present

## 2020-04-16 DIAGNOSIS — F419 Anxiety disorder, unspecified: Secondary | ICD-10-CM | POA: Diagnosis not present

## 2020-04-16 DIAGNOSIS — F329 Major depressive disorder, single episode, unspecified: Secondary | ICD-10-CM | POA: Diagnosis not present

## 2020-04-16 DIAGNOSIS — I10 Essential (primary) hypertension: Secondary | ICD-10-CM | POA: Diagnosis not present

## 2020-04-16 DIAGNOSIS — E1140 Type 2 diabetes mellitus with diabetic neuropathy, unspecified: Secondary | ICD-10-CM | POA: Diagnosis not present

## 2020-04-16 DIAGNOSIS — M48062 Spinal stenosis, lumbar region with neurogenic claudication: Secondary | ICD-10-CM | POA: Diagnosis not present

## 2020-04-16 DIAGNOSIS — M7062 Trochanteric bursitis, left hip: Secondary | ICD-10-CM | POA: Diagnosis not present

## 2020-04-16 DIAGNOSIS — Z48811 Encounter for surgical aftercare following surgery on the nervous system: Secondary | ICD-10-CM | POA: Diagnosis not present

## 2020-04-16 DIAGNOSIS — I951 Orthostatic hypotension: Secondary | ICD-10-CM | POA: Diagnosis not present

## 2020-04-20 DIAGNOSIS — N6002 Solitary cyst of left breast: Secondary | ICD-10-CM | POA: Diagnosis not present

## 2020-04-20 DIAGNOSIS — N644 Mastodynia: Secondary | ICD-10-CM | POA: Diagnosis not present

## 2020-04-21 DIAGNOSIS — F419 Anxiety disorder, unspecified: Secondary | ICD-10-CM | POA: Diagnosis not present

## 2020-04-21 DIAGNOSIS — R270 Ataxia, unspecified: Secondary | ICD-10-CM | POA: Diagnosis not present

## 2020-04-21 DIAGNOSIS — E1140 Type 2 diabetes mellitus with diabetic neuropathy, unspecified: Secondary | ICD-10-CM | POA: Diagnosis not present

## 2020-04-21 DIAGNOSIS — I10 Essential (primary) hypertension: Secondary | ICD-10-CM | POA: Diagnosis not present

## 2020-04-21 DIAGNOSIS — M7062 Trochanteric bursitis, left hip: Secondary | ICD-10-CM | POA: Diagnosis not present

## 2020-04-21 DIAGNOSIS — I951 Orthostatic hypotension: Secondary | ICD-10-CM | POA: Diagnosis not present

## 2020-04-21 DIAGNOSIS — Z48811 Encounter for surgical aftercare following surgery on the nervous system: Secondary | ICD-10-CM | POA: Diagnosis not present

## 2020-04-21 DIAGNOSIS — F329 Major depressive disorder, single episode, unspecified: Secondary | ICD-10-CM | POA: Diagnosis not present

## 2020-04-21 DIAGNOSIS — M48062 Spinal stenosis, lumbar region with neurogenic claudication: Secondary | ICD-10-CM | POA: Diagnosis not present

## 2020-04-22 DIAGNOSIS — Z9889 Other specified postprocedural states: Secondary | ICD-10-CM | POA: Diagnosis not present

## 2020-04-22 DIAGNOSIS — R112 Nausea with vomiting, unspecified: Secondary | ICD-10-CM | POA: Diagnosis not present

## 2020-04-22 DIAGNOSIS — Z09 Encounter for follow-up examination after completed treatment for conditions other than malignant neoplasm: Secondary | ICD-10-CM | POA: Diagnosis not present

## 2020-04-22 DIAGNOSIS — Z8719 Personal history of other diseases of the digestive system: Secondary | ICD-10-CM | POA: Diagnosis not present

## 2020-04-24 DIAGNOSIS — M5459 Other low back pain: Secondary | ICD-10-CM | POA: Diagnosis not present

## 2020-04-24 DIAGNOSIS — M4316 Spondylolisthesis, lumbar region: Secondary | ICD-10-CM | POA: Diagnosis not present

## 2020-04-28 ENCOUNTER — Telehealth: Payer: Self-pay | Admitting: Cardiology

## 2020-04-28 DIAGNOSIS — M7062 Trochanteric bursitis, left hip: Secondary | ICD-10-CM | POA: Diagnosis not present

## 2020-04-28 DIAGNOSIS — E1140 Type 2 diabetes mellitus with diabetic neuropathy, unspecified: Secondary | ICD-10-CM | POA: Diagnosis not present

## 2020-04-28 DIAGNOSIS — F329 Major depressive disorder, single episode, unspecified: Secondary | ICD-10-CM | POA: Diagnosis not present

## 2020-04-28 DIAGNOSIS — R270 Ataxia, unspecified: Secondary | ICD-10-CM | POA: Diagnosis not present

## 2020-04-28 DIAGNOSIS — I10 Essential (primary) hypertension: Secondary | ICD-10-CM | POA: Diagnosis not present

## 2020-04-28 DIAGNOSIS — M48062 Spinal stenosis, lumbar region with neurogenic claudication: Secondary | ICD-10-CM | POA: Diagnosis not present

## 2020-04-28 DIAGNOSIS — Z48811 Encounter for surgical aftercare following surgery on the nervous system: Secondary | ICD-10-CM | POA: Diagnosis not present

## 2020-04-28 DIAGNOSIS — I951 Orthostatic hypotension: Secondary | ICD-10-CM | POA: Diagnosis not present

## 2020-04-28 DIAGNOSIS — F419 Anxiety disorder, unspecified: Secondary | ICD-10-CM | POA: Diagnosis not present

## 2020-04-28 NOTE — Telephone Encounter (Signed)
Pt advised to call her PCP and or GI doctor for guidance on her reglan. Pt verbalized understanding and had no additional questions.

## 2020-04-28 NOTE — Telephone Encounter (Signed)
    Pt c/o medication issue:  1. Name of Medication:   metoCLOPramide (REGLAN) 10 MG tablet    2. How are you currently taking this medication (dosage and times per day)? Take 1 tablet (10 mg total) by mouth 4 (four) times daily. X 2weeks  3. Are you having a reaction (difficulty breathing--STAT)?   4. What is your medication issue? Pt said Dr. Geraldo Pitter told her she can stop taking this meds, however, pt said she can't stop it because if her acid reflux

## 2020-04-29 ENCOUNTER — Telehealth: Payer: Self-pay | Admitting: Gastroenterology

## 2020-04-29 NOTE — Telephone Encounter (Signed)
Spoke to patient who was seen at [facility] on 04/22/20. She will contact there office for advice on her medication.

## 2020-04-29 NOTE — Telephone Encounter (Signed)
Pt is requesting a call back from a nurse to discuss her reflux and heartburn, pt would like to know if the provider could prescribe something for her.

## 2020-04-30 DIAGNOSIS — Z6825 Body mass index (BMI) 25.0-25.9, adult: Secondary | ICD-10-CM | POA: Diagnosis not present

## 2020-04-30 DIAGNOSIS — M79675 Pain in left toe(s): Secondary | ICD-10-CM | POA: Diagnosis not present

## 2020-05-05 ENCOUNTER — Telehealth: Payer: Self-pay | Admitting: Neurology

## 2020-05-05 ENCOUNTER — Other Ambulatory Visit: Payer: Self-pay

## 2020-05-05 DIAGNOSIS — H9313 Tinnitus, bilateral: Secondary | ICD-10-CM

## 2020-05-05 DIAGNOSIS — R519 Headache, unspecified: Secondary | ICD-10-CM

## 2020-05-05 DIAGNOSIS — Z9181 History of falling: Secondary | ICD-10-CM | POA: Diagnosis not present

## 2020-05-05 DIAGNOSIS — Z6825 Body mass index (BMI) 25.0-25.9, adult: Secondary | ICD-10-CM | POA: Diagnosis not present

## 2020-05-05 DIAGNOSIS — M79674 Pain in right toe(s): Secondary | ICD-10-CM | POA: Diagnosis not present

## 2020-05-05 DIAGNOSIS — F418 Other specified anxiety disorders: Secondary | ICD-10-CM | POA: Diagnosis not present

## 2020-05-05 DIAGNOSIS — R413 Other amnesia: Secondary | ICD-10-CM | POA: Diagnosis not present

## 2020-05-05 DIAGNOSIS — S99921A Unspecified injury of right foot, initial encounter: Secondary | ICD-10-CM | POA: Diagnosis not present

## 2020-05-05 DIAGNOSIS — I10 Essential (primary) hypertension: Secondary | ICD-10-CM | POA: Diagnosis not present

## 2020-05-05 MED ORDER — CLOPIDOGREL BISULFATE 75 MG PO TABS: 75.0000 mg | ORAL_TABLET | Freq: Every day | ORAL | 2 refills | Status: DC

## 2020-05-09 DIAGNOSIS — I2510 Atherosclerotic heart disease of native coronary artery without angina pectoris: Secondary | ICD-10-CM | POA: Diagnosis not present

## 2020-05-09 DIAGNOSIS — G5793 Unspecified mononeuropathy of bilateral lower limbs: Secondary | ICD-10-CM | POA: Diagnosis not present

## 2020-05-09 DIAGNOSIS — M5440 Lumbago with sciatica, unspecified side: Secondary | ICD-10-CM | POA: Diagnosis not present

## 2020-05-09 DIAGNOSIS — M1990 Unspecified osteoarthritis, unspecified site: Secondary | ICD-10-CM | POA: Diagnosis not present

## 2020-05-09 HISTORY — PX: HERNIA REPAIR: SHX51

## 2020-05-13 DIAGNOSIS — Z6825 Body mass index (BMI) 25.0-25.9, adult: Secondary | ICD-10-CM | POA: Diagnosis not present

## 2020-05-13 DIAGNOSIS — Z20822 Contact with and (suspected) exposure to covid-19: Secondary | ICD-10-CM | POA: Diagnosis not present

## 2020-05-13 DIAGNOSIS — Z9181 History of falling: Secondary | ICD-10-CM | POA: Diagnosis not present

## 2020-05-13 DIAGNOSIS — T148XXA Other injury of unspecified body region, initial encounter: Secondary | ICD-10-CM | POA: Diagnosis not present

## 2020-05-19 DIAGNOSIS — U071 COVID-19: Secondary | ICD-10-CM | POA: Diagnosis not present

## 2020-05-19 DIAGNOSIS — R030 Elevated blood-pressure reading, without diagnosis of hypertension: Secondary | ICD-10-CM | POA: Diagnosis not present

## 2020-05-19 DIAGNOSIS — Z20822 Contact with and (suspected) exposure to covid-19: Secondary | ICD-10-CM | POA: Diagnosis not present

## 2020-05-21 ENCOUNTER — Ambulatory Visit: Payer: Medicare HMO | Admitting: Gastroenterology

## 2020-05-25 DIAGNOSIS — M5459 Other low back pain: Secondary | ICD-10-CM | POA: Diagnosis not present

## 2020-05-25 DIAGNOSIS — M4316 Spondylolisthesis, lumbar region: Secondary | ICD-10-CM | POA: Diagnosis not present

## 2020-05-28 DIAGNOSIS — J019 Acute sinusitis, unspecified: Secondary | ICD-10-CM | POA: Diagnosis not present

## 2020-05-28 DIAGNOSIS — Z1152 Encounter for screening for COVID-19: Secondary | ICD-10-CM | POA: Diagnosis not present

## 2020-06-02 DIAGNOSIS — K2100 Gastro-esophageal reflux disease with esophagitis, without bleeding: Secondary | ICD-10-CM | POA: Diagnosis not present

## 2020-06-02 DIAGNOSIS — U071 COVID-19: Secondary | ICD-10-CM | POA: Diagnosis not present

## 2020-06-02 DIAGNOSIS — I10 Essential (primary) hypertension: Secondary | ICD-10-CM | POA: Diagnosis not present

## 2020-06-02 DIAGNOSIS — L240 Irritant contact dermatitis due to detergents: Secondary | ICD-10-CM | POA: Diagnosis not present

## 2020-06-02 DIAGNOSIS — M150 Primary generalized (osteo)arthritis: Secondary | ICD-10-CM | POA: Diagnosis not present

## 2020-06-02 DIAGNOSIS — E1142 Type 2 diabetes mellitus with diabetic polyneuropathy: Secondary | ICD-10-CM | POA: Diagnosis not present

## 2020-06-02 DIAGNOSIS — E785 Hyperlipidemia, unspecified: Secondary | ICD-10-CM | POA: Diagnosis not present

## 2020-06-02 DIAGNOSIS — I2510 Atherosclerotic heart disease of native coronary artery without angina pectoris: Secondary | ICD-10-CM | POA: Diagnosis not present

## 2020-06-02 DIAGNOSIS — G4700 Insomnia, unspecified: Secondary | ICD-10-CM | POA: Diagnosis not present

## 2020-06-03 ENCOUNTER — Encounter: Payer: Self-pay | Admitting: Gastroenterology

## 2020-06-03 ENCOUNTER — Ambulatory Visit (INDEPENDENT_AMBULATORY_CARE_PROVIDER_SITE_OTHER): Payer: Medicare HMO | Admitting: Gastroenterology

## 2020-06-03 VITALS — BP 114/70 | HR 80 | Ht 65.0 in | Wt 158.4 lb

## 2020-06-03 DIAGNOSIS — K219 Gastro-esophageal reflux disease without esophagitis: Secondary | ICD-10-CM

## 2020-06-03 NOTE — Progress Notes (Signed)
IMPRESSION and PLAN:    #1. GERD with large HH s/p repair Nov 2021 with resolution of UGI symptoms.   #2. H/O colonic polyps-tubular adenomas (s/p colon 12/2018. Next due 12/2021).   Plan: -Stop Protonix  -Decrease famotidine 40mg  po QAM -FU in 1 year.      HPI:    Chief Complaint:   Deborah Hutchinson is a 75 y.o. female with CAD on plavix, DM2, HH, OSA, COPD, LBP d/t OA, HTN, HLD  For follow-up visit  S/P HH repair by Dr. Abran Richard November 2021  All her upper GI symptoms have resolved.  She denies having any further heartburn, nausea, regurgitation.  No odynophagia or dysphagia.   She would occasionally have left sided abdominal pain-not significant.  This is related to back pain as well.  No significant diarrhea or constipation.  She has been seen by Dr. Derrill Kay and has been taken off Reglan.    Past GI procedures: -EGD 08/2019: Candida esophagitis, no stricture, 7 cm hiatal hernia.  Treated empirically for Candida esophagitis  EGD 12/14/2018: Large HH, hyperplastic gastric polyps, eso stricture s/p dil 50 Fr. EGD 08/17/2015 mod HH, neg SB Bx for celiac. -UGI series 04/2019: HH, GERD, no strictures.  Ba tab passed without any problems. -Colonoscopy 12/14/2018-colonic polyps s/p polypectomy, mild pancolonic div. Bx- TA. -CT AP with contrast 02/09/2018: Neg, Stable left adrenal adenoma, small right inguinal hernia. -GES 08/2019: delayed gastric emptying (12% emptied at hr 1, 29% at hr 2, 47% at hr 3, 64% an hr 4).  LHC 05/08/2019:  Prox RCA lesion is 50% stenosed.  Prox LAD lesion is 40% stenosed.  The left ventricular systolic function is normal.  LV end diastolic pressure is normal.  The left ventricular ejection fraction is greater than 65% by visual estimate.  There is no mitral valve regurgitation. Past Medical History:  Diagnosis Date  . Angina    normal stress test  . Anxiety   . Arthritis   . Asthma   . Bronchitis   . CAD (coronary artery disease)  02/13/2019  . Chest pain 05/08/2019  . Chest pain of uncertain etiology   . Chronic bilateral low back pain without sciatica 07/11/2019   Formatting of this note might be different from the original. Added automatically from request for surgery [id number]  . COPD (chronic obstructive pulmonary disease) ([place]) 02/09/2017  . 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 Diabetes mellitus   . Diabetes mellitus due to underlying condition with unspecified complications ([place]) 0000000  . Dyslipidemia 06/10/2015  . Essential hypertension 06/10/2015  . GERD (gastroesophageal reflux disease)   . H/O hiatal hernia   . Headache 09/24/2018  . Heart murmur   . Hyperlipemia   . Hypertension   . Mixed dyslipidemia 06/10/2015  . Moderate aortic stenosis 05/16/2019  . Nonspecific (abnormal) findings on radiological and other examination of gastrointestinal tract 05/19/2011  . OSA (obstructive sleep apnea) 02/09/2017  . Pain and swelling of toe of right foot 07/15/2016  . Palpitations 06/02/2016  . Shortness of breath    on  excertion  . Sleep apnea     Current Outpatient Medications  Medication Sig Dispense Refill  . albuterol (PROVENTIL HFA;VENTOLIN HFA) 108 (90 Base) MCG/ACT inhaler Inhale 2 puffs into the lungs every 6 (six) hours as needed for wheezing or shortness of breath. 3 Inhaler 3  . amLODipine (NORVASC) 10 MG tablet Take 10 mg by mouth daily.    Marland Kitchen  budesonide-formoterol (SYMBICORT) 160-4.5 MCG/ACT inhaler Inhale 2 puffs into the lungs 2 (two) times daily. 3 Inhaler 3  . buPROPion (WELLBUTRIN XL) 300 MG 24 hr tablet Take 300 mg by mouth daily.     . Cholecalciferol (VITAMIN D PO) Take 5,000 Units by mouth as directed. Every 2 weeks    . clopidogrel (PLAVIX) 75 MG tablet Take 1 tablet (75 mg total) by mouth daily. 30 tablet 11  . dicyclomine (BENTYL) 20 MG tablet Take 20 mg by mouth every 6 (six) hours as needed.     .      . escitalopram  (LEXAPRO) 10 MG tablet Take 10 mg by mouth daily.    . famotidine (PEPCID) 40 MG tablet Take 1 tablet (40 mg total) by mouth at bedtime. 180 tablet 3  . fluticasone (VERAMYST) 27.5 MCG/SPRAY nasal spray Place 2 sprays into the nose daily.    Marland Kitchen gabapentin (NEURONTIN) 300 MG capsule Take 300 mg by mouth 3 (three) times daily as needed.     . hydrALAZINE (APRESOLINE) 50 MG tablet Take 1 tablet (50 mg total) by mouth 3 (three) times daily. 180 tablet 2  . hydrochlorothiazide (HYDRODIURIL) 25 MG tablet Take 1 tablet (25 mg total) by mouth daily. 60 tablet 2  . Lancets (ACCU-CHEK MULTICLIX) lancets     . linaclotide (LINZESS) 72 MCG capsule Take 72 mcg by mouth as needed.    Marland Kitchen losartan (COZAAR) 100 MG tablet Take 1 tablet (100 mg total) by mouth daily. 60 tablet 2  . metFORMIN (GLUCOPHAGE) 500 MG tablet Take 500 mg by mouth 2 (two) times daily with a meal.     . montelukast (SINGULAIR) 10 MG tablet Take 10 mg by mouth at bedtime.     . Multiple Vitamin (MULTIVITAMIN) capsule Take 1 capsule by mouth daily.      Marland Kitchen oxybutynin (DITROPAN-XL) 10 MG 24 hr tablet Take 10 mg by mouth daily.    . pantoprazole (PROTONIX) 40 MG tablet Take 1 tablet (40 mg total) by mouth daily. 60 tablet 2  . Potassium Chloride CR (MICRO-K) 8 MEQ CPCR capsule CR TAKE 1 CAPSULE BY MOUTH ONCE DAILY FOR LOW POTASSIUM    . simvastatin (ZOCOR) 20 MG tablet Take 20 mg by mouth at bedtime.    Marland Kitchen spironolactone (ALDACTONE) 25 MG tablet Take 1 tablet (25 mg total) by mouth daily. 60 tablet 2  . traMADol (ULTRAM) 50 MG tablet Take 50 mg by mouth 2 (two) times daily as needed for pain.    Marland Kitchen VICTOZA 18 MG/3ML SOPN 12 mg daily.     . BD PEN NEEDLE NANO U/F 32G X 4 MM MISC     . isosorbide mononitrate (IMDUR) 120 MG 24 hr tablet Take 1 tablet (120 mg total) by mouth daily. (Patient not taking: Reported on 06/10/2019) 60 tablet 2  . nitroGLYCERIN (NITROSTAT) 0.4 MG SL tablet DISSOLVE ONE TABLET UNDER THE TONGUE EVERY 5 MINUTES AS NEEDED FOR CHEST  PAIN.  DO NOT EXCEED A TOTAL OF 3 DOSES IN 15 MINUTES (Patient not taking: Reported on 06/10/2019) 25 tablet 5   No current facility-administered medications for this visit.    Past Surgical History:  Procedure Laterality Date  . ABDOMINAL HYSTERECTOMY    . ABDOMINAL HYSTERECTOMY    . BREAST SURGERY     begin mass,left  breast  . COLONOSCOPY  08/17/2015   Colonic polyp status post polypectomy. Mild pancolonic diverticulosis. Highly redundant colon.   Marland Kitchen DILATION AND CURETTAGE OF UTERUS  one  . ESOPHAGOGASTRODUODENOSCOPY  08/17/2015   Moderate hiatal hernia. Otherwise noraml EGD.   Marland Kitchen ESOPHAGOGASTRODUODENOSCOPY  09/03/2019   [facility]  . EUS  05/19/2011   Procedure: UPPER ENDOSCOPIC ULTRASOUND (EUS) LINEAR;  Surgeon: Owens Loffler, MD;  Location: [facility];  Service: Endoscopy;  Laterality: N/A;  radial linear   . HYSTEROTOMY    . KNEE SURGERY    . LEFT HEART CATH AND CORONARY ANGIOGRAPHY    . LEFT HEART CATH AND CORONARY ANGIOGRAPHY N/A 05/08/2019   Procedure: LEFT HEART CATH AND CORONARY ANGIOGRAPHY;  Surgeon: Burnell Blanks, MD;  Location: [facility];  Service: Cardiovascular;  Laterality: N/A;  . right thumb     tendon repaire  . SHOULDER SURGERY     rotator cuff repair  . TRANSFORAMINAL LUMBAR INTERBODY FUSION L4-5   02/27/2020  . WRIST SURGERY     rig[facility]    Family History  Problem Relation Age of Onset  . Diabetes Sister   . Hypertension Daughter   . Colon cancer Neg Hx     Social History   Tobacco Use  . Smoking status: Former Smoker    Packs/day: 1.00    Quit date: 05/17/2011    Years since quitting: 9.0  . Smokeless tobacco: Never Used  Vaping Use  . Vaping Use: Never used  Substance Use Topics  . Alcohol use: No  . Drug use: No    Allergies  Allergen Reactions  . Penicillins Rash    Reaction was 30years ago  Has never taken again Reaction was 30years ago Has never taken again Has patient had a PCN reaction causing  anaphylaxis, immediate rash, facial/tongue/throat swelling, SOB or lightheadedness with hypotension? no Has patient had a PCN reaction causing severe rash involving mucus membranes or skin necrosis? no Has patient had a PCN reaction that required hospitilization?no  Has patient had a PCN reaction occurring within the last 10 years? no If all of the above answers are "no" then may proceed with cephalosporin use  . Gabapentin Other (See Comments)    Hullination   Hallucinations   . Tramadol Other (See Comments)    Hallunications  Hallucinations      Review of Systems: All systems reviewed and negative except where noted in HPI.    Physical Exam:     BP 114/70   Pulse 80   Ht 5\' 5"  (1.651 m)   Wt 158 lb 6 oz (71.8 kg)   BMI 26.35 kg/m   Gen: awake, alert, NAD HEENT: anicteric, no pallor CV: RRR, no mrg Pulm: CTA b/l Abd: soft, NT/ND, +BS throughout Ext: no c/c/e Neuro: nonfocal      Johnie Stadel,MD 06/03/2020, 9:43 AM

## 2020-06-03 NOTE — Patient Instructions (Addendum)
Discontinue Protonix  Begin taking Famotidine in the morning only  Please follow up in one year

## 2020-06-04 DIAGNOSIS — Z981 Arthrodesis status: Secondary | ICD-10-CM | POA: Diagnosis not present

## 2020-06-04 DIAGNOSIS — Z4789 Encounter for other orthopedic aftercare: Secondary | ICD-10-CM | POA: Diagnosis not present

## 2020-06-09 DIAGNOSIS — M1990 Unspecified osteoarthritis, unspecified site: Secondary | ICD-10-CM | POA: Diagnosis not present

## 2020-06-09 DIAGNOSIS — I2510 Atherosclerotic heart disease of native coronary artery without angina pectoris: Secondary | ICD-10-CM | POA: Diagnosis not present

## 2020-06-09 DIAGNOSIS — G5793 Unspecified mononeuropathy of bilateral lower limbs: Secondary | ICD-10-CM | POA: Diagnosis not present

## 2020-06-09 DIAGNOSIS — M5440 Lumbago with sciatica, unspecified side: Secondary | ICD-10-CM | POA: Diagnosis not present

## 2020-06-10 DIAGNOSIS — U071 COVID-19: Secondary | ICD-10-CM | POA: Diagnosis not present

## 2020-06-10 DIAGNOSIS — I10 Essential (primary) hypertension: Secondary | ICD-10-CM | POA: Diagnosis not present

## 2020-06-10 DIAGNOSIS — I2510 Atherosclerotic heart disease of native coronary artery without angina pectoris: Secondary | ICD-10-CM | POA: Diagnosis not present

## 2020-06-10 DIAGNOSIS — E785 Hyperlipidemia, unspecified: Secondary | ICD-10-CM | POA: Diagnosis not present

## 2020-06-10 DIAGNOSIS — M150 Primary generalized (osteo)arthritis: Secondary | ICD-10-CM | POA: Diagnosis not present

## 2020-06-10 DIAGNOSIS — L240 Irritant contact dermatitis due to detergents: Secondary | ICD-10-CM | POA: Diagnosis not present

## 2020-06-10 DIAGNOSIS — K2100 Gastro-esophageal reflux disease with esophagitis, without bleeding: Secondary | ICD-10-CM | POA: Diagnosis not present

## 2020-06-10 DIAGNOSIS — E1142 Type 2 diabetes mellitus with diabetic polyneuropathy: Secondary | ICD-10-CM | POA: Diagnosis not present

## 2020-06-10 DIAGNOSIS — G4700 Insomnia, unspecified: Secondary | ICD-10-CM | POA: Diagnosis not present

## 2020-06-12 ENCOUNTER — Telehealth: Payer: Self-pay | Admitting: Cardiology

## 2020-06-12 DIAGNOSIS — E785 Hyperlipidemia, unspecified: Secondary | ICD-10-CM | POA: Diagnosis not present

## 2020-06-12 DIAGNOSIS — I2510 Atherosclerotic heart disease of native coronary artery without angina pectoris: Secondary | ICD-10-CM | POA: Diagnosis not present

## 2020-06-12 DIAGNOSIS — U071 COVID-19: Secondary | ICD-10-CM | POA: Diagnosis not present

## 2020-06-12 DIAGNOSIS — M150 Primary generalized (osteo)arthritis: Secondary | ICD-10-CM | POA: Diagnosis not present

## 2020-06-12 DIAGNOSIS — E1142 Type 2 diabetes mellitus with diabetic polyneuropathy: Secondary | ICD-10-CM | POA: Diagnosis not present

## 2020-06-12 DIAGNOSIS — K2100 Gastro-esophageal reflux disease with esophagitis, without bleeding: Secondary | ICD-10-CM | POA: Diagnosis not present

## 2020-06-12 DIAGNOSIS — L240 Irritant contact dermatitis due to detergents: Secondary | ICD-10-CM | POA: Diagnosis not present

## 2020-06-12 DIAGNOSIS — G4700 Insomnia, unspecified: Secondary | ICD-10-CM | POA: Diagnosis not present

## 2020-06-12 DIAGNOSIS — I10 Essential (primary) hypertension: Secondary | ICD-10-CM | POA: Diagnosis not present

## 2020-06-12 NOTE — Telephone Encounter (Signed)
Clarification of medication given to [facility]. No further questions.

## 2020-06-12 NOTE — Telephone Encounter (Signed)
Pt c/o medication issue:  1. Name of Medication:  pantoprazole (PROTONIX) 40 MG tablet clopidogrel (PLAVIX) 75 MG tablet  2. How are you currently taking this medication (dosage and times per day)? Pantoprazole 1 tablet twice a day, plavix 1 tablet daily  3. Are you having a reaction (difficulty breathing--STAT)? no  4. What is your medication issue? Sharyn Lull from [facility] calling in regards to a fax about a medication interaction between pantoprazole and plavix. She states she received a fax back stating to change Protonix to daily and stop pantoprazole, but they are the same medication. Phone: [phone]

## 2020-06-16 ENCOUNTER — Telehealth: Payer: Self-pay | Admitting: Cardiology

## 2020-06-16 ENCOUNTER — Other Ambulatory Visit: Payer: Self-pay

## 2020-06-16 DIAGNOSIS — G4700 Insomnia, unspecified: Secondary | ICD-10-CM | POA: Diagnosis not present

## 2020-06-16 DIAGNOSIS — M1990 Unspecified osteoarthritis, unspecified site: Secondary | ICD-10-CM | POA: Insufficient documentation

## 2020-06-16 DIAGNOSIS — M150 Primary generalized (osteo)arthritis: Secondary | ICD-10-CM | POA: Diagnosis not present

## 2020-06-16 DIAGNOSIS — U071 COVID-19: Secondary | ICD-10-CM | POA: Diagnosis not present

## 2020-06-16 DIAGNOSIS — R0602 Shortness of breath: Secondary | ICD-10-CM | POA: Insufficient documentation

## 2020-06-16 DIAGNOSIS — I10 Essential (primary) hypertension: Secondary | ICD-10-CM | POA: Diagnosis not present

## 2020-06-16 DIAGNOSIS — I2510 Atherosclerotic heart disease of native coronary artery without angina pectoris: Secondary | ICD-10-CM | POA: Diagnosis not present

## 2020-06-16 DIAGNOSIS — K219 Gastro-esophageal reflux disease without esophagitis: Secondary | ICD-10-CM | POA: Insufficient documentation

## 2020-06-16 DIAGNOSIS — R011 Cardiac murmur, unspecified: Secondary | ICD-10-CM | POA: Insufficient documentation

## 2020-06-16 DIAGNOSIS — E1142 Type 2 diabetes mellitus with diabetic polyneuropathy: Secondary | ICD-10-CM | POA: Diagnosis not present

## 2020-06-16 DIAGNOSIS — E785 Hyperlipidemia, unspecified: Secondary | ICD-10-CM | POA: Insufficient documentation

## 2020-06-16 DIAGNOSIS — K2100 Gastro-esophageal reflux disease with esophagitis, without bleeding: Secondary | ICD-10-CM | POA: Diagnosis not present

## 2020-06-16 DIAGNOSIS — F419 Anxiety disorder, unspecified: Secondary | ICD-10-CM | POA: Insufficient documentation

## 2020-06-16 DIAGNOSIS — R112 Nausea with vomiting, unspecified: Secondary | ICD-10-CM

## 2020-06-16 DIAGNOSIS — Z8719 Personal history of other diseases of the digestive system: Secondary | ICD-10-CM | POA: Insufficient documentation

## 2020-06-16 DIAGNOSIS — G4730 Sleep apnea, unspecified: Secondary | ICD-10-CM | POA: Insufficient documentation

## 2020-06-16 DIAGNOSIS — L240 Irritant contact dermatitis due to detergents: Secondary | ICD-10-CM | POA: Diagnosis not present

## 2020-06-16 DIAGNOSIS — J40 Bronchitis, not specified as acute or chronic: Secondary | ICD-10-CM | POA: Insufficient documentation

## 2020-06-16 HISTORY — DX: Nausea with vomiting, unspecified: R11.2

## 2020-06-16 NOTE — Telephone Encounter (Signed)
Spoke to the patient just now and she let me know that her breast is hurting when she breaths in deeply. She states that it is not chest pain as much as it is her breast that is hurting. This pain is constant when taking deep breaths. She has not taken anything for this pain. She states that she is also dizzy when standing up and she remains dizzy until she sits back down. Patients blood pressure is currently 138/83. Heart rate was 86 beats per minute.   Patient was already scheduled with Dr. Geraldo Pitter on 06/17/2020 and plans to keep this appointment.

## 2020-06-16 NOTE — Telephone Encounter (Signed)
Pt c/o of Chest Pain: STAT if CP now or developed within 24 hours  1. Are you having CP right now? Yes hurting in breast and hurts when she bteathes  2. Are you experiencing any other symptoms (ex. SOB, nausea, vomiting, sweating)? Shortness of breath all the time  3. How long have you been experiencing CP? A couple of weeks ago  4. Is your CP continuous or coming and going? stays  5. Have you taken Nitroglycerin? No- pt wanted to be seen today- I made her an appt for tomorrow with Dr Geraldo Pitter- please call to evaluate ?

## 2020-06-16 NOTE — Telephone Encounter (Signed)
Spoke to the patient just now and let her know Dr. Julien Nordmann recommendations. She verbalizes understanding and thanks me for the call back.

## 2020-06-16 NOTE — Telephone Encounter (Signed)
She needs to go to the emergency room  if things do not get better and she is not well.

## 2020-06-17 ENCOUNTER — Encounter: Payer: Self-pay | Admitting: Cardiology

## 2020-06-17 ENCOUNTER — Telehealth: Payer: Self-pay | Admitting: Cardiology

## 2020-06-17 ENCOUNTER — Other Ambulatory Visit: Payer: Self-pay

## 2020-06-17 ENCOUNTER — Ambulatory Visit (INDEPENDENT_AMBULATORY_CARE_PROVIDER_SITE_OTHER): Payer: Medicare HMO | Admitting: Cardiology

## 2020-06-17 VITALS — BP 108/58 | HR 86 | Ht 64.0 in | Wt 161.0 lb

## 2020-06-17 DIAGNOSIS — I350 Nonrheumatic aortic (valve) stenosis: Secondary | ICD-10-CM

## 2020-06-17 DIAGNOSIS — E088 Diabetes mellitus due to underlying condition with unspecified complications: Secondary | ICD-10-CM | POA: Diagnosis not present

## 2020-06-17 DIAGNOSIS — Z01810 Encounter for preprocedural cardiovascular examination: Secondary | ICD-10-CM | POA: Diagnosis not present

## 2020-06-17 DIAGNOSIS — I10 Essential (primary) hypertension: Secondary | ICD-10-CM

## 2020-06-17 DIAGNOSIS — I209 Angina pectoris, unspecified: Secondary | ICD-10-CM | POA: Insufficient documentation

## 2020-06-17 DIAGNOSIS — E782 Mixed hyperlipidemia: Secondary | ICD-10-CM

## 2020-06-17 DIAGNOSIS — I2510 Atherosclerotic heart disease of native coronary artery without angina pectoris: Secondary | ICD-10-CM | POA: Diagnosis not present

## 2020-06-17 DIAGNOSIS — Z01818 Encounter for other preprocedural examination: Secondary | ICD-10-CM | POA: Diagnosis not present

## 2020-06-17 HISTORY — DX: Angina pectoris, unspecified: I20.9

## 2020-06-17 MED ORDER — LOSARTAN POTASSIUM 50 MG PO TABS: 50.0000 mg | ORAL_TABLET | Freq: Every day | ORAL | 1 refills | Status: DC

## 2020-06-17 NOTE — H&P (View-Only) (Signed)
Cardiology Office Note:    Date:  06/17/2020   ID:  Deborah Hutchinson, DOB [birth year], MRN [id number]  PCP:  [name], MD  Cardiologist:  [name], MD   Referring MD: [name], MD    ASSESSMENT:    1. Moderate aortic stenosis   2. Essential hypertension   3. Coronary artery disease involving native coronary artery of native heart without angina pectoris   4. Diabetes mellitus due to underlying condition with unspecified complications ([place])   5. Mixed dyslipidemia   6. Angina pectoris ([place])    PLAN:    In order of problems listed above:  1. Angina pectoris: Moderate aortic stenosis: Patient has known coronary artery disease and significant nonobstructive disease in 2020 for coronary angiography.  She has moderate aortic stenosis.  Her symptoms are very concerning.  Suggestive of angina pectoris.  Following discussion was made with the patient.In view of the patient's symptoms, I discussed with the patient options for evaluation. Invasive and noninvasive options were given to the patient. I discussed stress testing and coronary angiography and left heart catheterization at length. Benefits, pros and cons of each approach were discussed at length. Patient had multiple questions which were answered to the patient's satisfaction. Patient opted for invasive evaluation and we will set up for coronary angiography and left heart catheterization. Further recommendations will be made based on the findings with coronary angiography. In the interim if the patient has any significant symptoms in hospital to the nearest emergency room. 2. Symptoms of orthostatic hypotension: For this reason I have reduced her losartan to 50 mg daily.  Fall precautions were emphasized. 3. Moderate aortic stenosis: Right and left heart catheterization will help assess the valvular heart disease issue also. 4. Mixed dyslipidemia: Diet was emphasized and she promises to stay focused on her diet.  This was  discussed in detail. 5. Sublingual nitroglycerin prescription was sent, its protocol and 911 protocol explained and the patient vocalized understanding questions were answered to the patient's satisfaction.  I told her to use sublingual nitroglycerin prescription was sent, its protocol and 911 protocol explained and the patient vocalized understanding questions were answered to the patient's satisfaction very cautiously in view of borderline blood pressure.  I told her to lie down and use it if necessary.  She understood.   Medication Adjustments/Labs and Tests Ordered: Current medicines are reviewed at length with the patient today.  Concerns regarding medicines are outlined above.  No orders of the defined types were placed in this encounter.  No orders of the defined types were placed in this encounter.    No chief complaint on file.    History of Present Illness:    Deborah Hutchinson is a 75 y.o. female.  Patient has past medical history of coronary artery disease, moderate aortic stenosis, essential hypertension and dyslipidemia.  She mentions to me that she is having chest pain virtually all day long.  It occurs to be worse on exertion.  She is used nitroglycerin in the past with relief.  She also feels dizzy at times.  No orthopnea or PND.  Chest tightness goes to the neck and to the back.  At the time of my evaluation, the patient is alert awake oriented and in no distress.  Past Medical History:  Diagnosis Date  . Aftercare following surgery 03/26/2020  . Angina    normal stress test  . Anxiety   . Arthritis   . Asthma   . Bronchitis   .  CAD (coronary artery disease) 02/13/2019  . Chest pain 05/08/2019  . Chest pain of uncertain etiology   . Chronic bilateral low back pain without sciatica 07/11/2019   Formatting of this note might be different from the original. Added automatically from request for surgery [phone]  . Chronic obstructive pulmonary disease ([place]) 02/09/2017    Formatting of this note might be different from the original. Last Assessment & Plan:  I will review her PFTs after I obtain records from her pulmonologist. Based on this we will decide if she needs inhalers, subjectively they do not seem to have helped her at all. She may benefit from a pulmonary rehabilitation program Formatting of this note might be different from the original. Last Assessment   . COPD (chronic obstructive pulmonary disease) ([place]) 02/09/2017  . 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 Diabetes mellitus   . Diabetes mellitus due to underlying condition with unspecified complications ([place]) 06/17/9369  . Dyslipidemia 06/10/2015  . Esophageal dysphagia 09/02/2019   Formatting of this note might be different from the original. Added automatically from request for surgery [phone]  . Essential hypertension 06/10/2015  . Gastroparesis 09/04/2019  . GERD (gastroesophageal reflux disease)   . H/O hiatal hernia   . Headache 09/24/2018  . Heart murmur   . Hyperlipemia   . Hypertension   . Mixed dyslipidemia 06/10/2015  . Moderate aortic stenosis 05/16/2019  . Nausea and vomiting 06/16/2020  . Nonspecific (abnormal) findings on radiological and other examination of gastrointestinal tract 05/19/2011  . OSA (obstructive sleep apnea) 02/09/2017  . Pain and swelling of toe of right foot 07/15/2016  . Palpitations 06/02/2016  . 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 Added automatically from request for surgery [id number]  . Peritoneal mesothelioma ([place]) 01/16/2020  . S/P lumbar fusion 03/26/2020  . Shortness of breath    on  excertion  . Sleep apnea     Past Surgical History:  Procedure Laterality Date  . ABDOMINAL HYSTERECTOMY    . ABDOMINAL HYSTERECTOMY    . BREAST SURGERY     begin mass,left  breast  . COLONOSCOPY  08/17/2015   Colonic polyp status post polypectomy. Mild  pancolonic diverticulosis. Highly redundant colon.   Marland Kitchen DILATION AND CURETTAGE OF UTERUS     one  . ESOPHAGOGASTRODUODENOSCOPY  08/17/2015   Moderate hiatal hernia. Otherwise noraml EGD.   Marland Kitchen ESOPHAGOGASTRODUODENOSCOPY  09/03/2019   [place]  . EUS  05/19/2011   Procedure: UPPER ENDOSCOPIC ULTRASOUND (EUS) LINEAR;  Surgeon: Owens Loffler, MD;  Location: [facility];  Service: Endoscopy;  Laterality: N/A;  radial linear   . HYSTEROTOMY    . KNEE SURGERY    . LEFT HEART CATH AND CORONARY ANGIOGRAPHY    . LEFT HEART CATH AND CORONARY ANGIOGRAPHY N/A 05/08/2019   Procedure: LEFT HEART CATH AND CORONARY ANGIOGRAPHY;  Surgeon: Burnell Blanks, MD;  Location: [facility];  Service: Cardiovascular;  Laterality: N/A;  . right thumb     tendon repaire  . SHOULDER SURGERY     rotator cuff repair  . TRANSFORAMINAL LUMBAR INTERBODY FUSION L4-5   02/27/2020  . WRIST SURGERY     [place]    Current Medications: Current Meds  Medication Sig  . albuterol (PROVENTIL HFA;VENTOLIN HFA) 108 (90 Base) MCG/ACT inhaler Inhale 2 puffs into the lungs every 6 (six) hours as needed for wheezing or shortness  of breath.  Marland Kitchen amLODipine (NORVASC) 10 MG tablet Take 10 mg by mouth daily.  . budesonide-formoterol (SYMBICORT) 160-4.5 MCG/ACT inhaler Inhale 2 puffs into the lungs 2 (two) times daily.  Marland Kitchen buPROPion (WELLBUTRIN XL) 300 MG 24 hr tablet Take 300 mg by mouth daily.   . canagliflozin (INVOKANA) 300 MG TABS tablet Take 300 mg by mouth daily.  . celecoxib (CELEBREX) 100 MG capsule Take 100-200 mg by mouth daily.  . cetirizine (ZYRTEC) 10 MG tablet Take 1 tablet by mouth at bedtime.  . Cholecalciferol (VITAMIN D3) 100000 UNIT/GM POWD Take 5,000 Units by mouth every 30 (thirty) days.  . clopidogrel (PLAVIX) 75 MG tablet Take 1 tablet (75 mg total) by mouth daily.  . diclofenac Sodium (VOLTAREN) 1 % GEL Apply 4 g topically as needed (pain).  Marland Kitchen dicyclomine (BENTYL) 10 MG capsule Take 10  mg by mouth 2 (two) times daily as needed for spasms.  Marland Kitchen doxycycline (VIBRA-TABS) 100 MG tablet Take 100 mg by mouth daily.  Marland Kitchen escitalopram (LEXAPRO) 10 MG tablet Take 10 mg by mouth daily.  . famotidine (PEPCID) 20 MG tablet Take 20 mg by mouth in the morning and at bedtime.  . fluticasone (FLONASE) 50 MCG/ACT nasal spray Place 2 sprays into both nostrils as needed for allergies.  . fluticasone (VERAMYST) 27.5 MCG/SPRAY nasal spray Place 2 sprays into the nose daily.  . hydrALAZINE (APRESOLINE) 50 MG tablet Take 1 tablet (50 mg total) by mouth 3 (three) times daily.  . hydrochlorothiazide (HYDRODIURIL) 25 MG tablet TAKE 1 TABLET EVERY DAY  . isosorbide mononitrate (IMDUR) 120 MG 24 hr tablet Take 1 tablet (120 mg total) by mouth daily.  Marland Kitchen linaclotide (LINZESS) 72 MCG capsule Take 1 capsule (72 mcg total) by mouth daily before breakfast.  . liraglutide (VICTOZA) 18 MG/3ML SOPN Inject 1.8 mg into the skin daily.  Marland Kitchen losartan (COZAAR) 100 MG tablet Take 1 tablet (100 mg total) by mouth daily.  . montelukast (SINGULAIR) 10 MG tablet Take 10 mg by mouth at bedtime.   . Multiple Vitamin (MULTIVITAMIN) capsule Take 1 capsule by mouth daily.  . nitroGLYCERIN (NITROSTAT) 0.4 MG SL tablet DISSOLVE ONE TABLET UNDER THE TONGUE EVERY 5 MINUTES AS NEEDED FOR CHEST PAIN.  DO NOT EXCEED A TOTAL OF 3 DOSES IN 15 MINUTES  . oxybutynin (DITROPAN) 5 MG tablet Take 10 mg by mouth daily.  . pantoprazole (PROTONIX) 40 MG tablet Take 1 tablet (40 mg total) by mouth 2 (two) times daily.  . Potassium Chloride CR (MICRO-K) 8 MEQ CPCR capsule CR TAKE 1 CAPSULE BY MOUTH ONCE DAILY FOR LOW POTASSIUM  . rosuvastatin (CRESTOR) 20 MG tablet Take 1 tablet (20 mg total) by mouth daily.  Marland Kitchen tiotropium (SPIRIVA) 18 MCG inhalation capsule Place 18 mcg into inhaler and inhale daily.     Allergies:   Penicillins, Gabapentin, and Tramadol   Social History   Socioeconomic History  . Marital status: Single    Spouse name: Not on  file  . Number of children: 3  . Years of education: Not on file  . Highest education level: Not on file  Occupational History  . Occupation: retired  Tobacco Use  . Smoking status: Former Smoker    Packs/day: 1.00    Quit date: 05/17/2011    Years since quitting: 9.0  . Smokeless tobacco: Never Used  Vaping Use  . Vaping Use: Never used  Substance and Sexual Activity  . Alcohol use: No  . Drug use: No  .  Sexual activity: Never  Other Topics Concern  . Not on file  Social History Narrative   Right handed   Social Determinants of Health   Financial Resource Strain: Not on file  Food Insecurity: Not on file  Transportation Needs: Not on file  Physical Activity: Not on file  Stress: Not on file  Social Connections: Not on file     Family History: The patient's family history includes Diabetes in her sister; Hypertension in her daughter. There is no history of Colon cancer.  ROS:   Please see the history of present illness.    All other systems reviewed and are negative.  EKGs/Labs/Other Studies Reviewed:    The following studies were reviewed today: EKG reveals sinus rhythm left ventricular hypertrophy and nonspecific ST-T changes.   Recent Labs: 02/03/2020: ALT 18; BUN 9; Creatinine, Ser 1.04; Potassium 3.9; Sodium 142  Recent Lipid Panel    Component Value Date/Time   CHOL 159 02/03/2020 0937   TRIG 65 02/03/2020 0937   HDL 82 02/03/2020 0937   CHOLHDL 1.9 02/03/2020 0937   LDLCALC 64 02/03/2020 0937    Physical Exam:    VS:  BP (!) 108/58   Pulse 86   Ht 5\' 4"  (1.626 m)   Wt 161 lb (73 kg)   SpO2 98%   BMI 27.64 kg/m     Wt Readings from Last 3 Encounters:  06/17/20 161 lb (73 kg)  06/03/20 158 lb 6 oz (71.8 kg)  04/13/20 158 lb (71.7 kg)     GEN: Patient is in no acute distress HEENT: Normal NECK: No JVD; No carotid bruits LYMPHATICS: No lymphadenopathy CARDIAC: Hear sounds regular, 2/6 systolic murmur at the apex. RESPIRATORY:  Clear to  auscultation without rales, wheezing or rhonchi  ABDOMEN: Soft, non-tender, non-distended MUSCULOSKELETAL:  No edema; No deformity  SKIN: Warm and dry NEUROLOGIC:  Alert and oriented x 3 PSYCHIATRIC:  Normal affect   Signed, [name], MD  06/17/2020 8:26 AM    [place]

## 2020-06-17 NOTE — Progress Notes (Signed)
Cardiology Office Note:    Date:  06/17/2020   ID:  Andreas Blower, DOB Feb 08, 1946, MRN [id number]  PCP:  [name], MD  Cardiologist:  [name], MD   Referring MD: [name], MD    ASSESSMENT:    1. Moderate aortic stenosis   2. Essential hypertension   3. Coronary artery disease involving native coronary artery of native heart without angina pectoris   4. Diabetes mellitus due to underlying condition with unspecified complications ([place])   5. Mixed dyslipidemia   6. Angina pectoris ([place])    PLAN:    In order of problems listed above:  1. Angina pectoris: Moderate aortic stenosis: Patient has known coronary artery disease and significant nonobstructive disease in 2020 for coronary angiography.  She has moderate aortic stenosis.  Her symptoms are very concerning.  Suggestive of angina pectoris.  Following discussion was made with the patient.In view of the patient's symptoms, I discussed with the patient options for evaluation. Invasive and noninvasive options were given to the patient. I discussed stress testing and coronary angiography and left heart catheterization at length. Benefits, pros and cons of each approach were discussed at length. Patient had multiple questions which were answered to the patient's satisfaction. Patient opted for invasive evaluation and we will set up for coronary angiography and left heart catheterization. Further recommendations will be made based on the findings with coronary angiography. In the interim if the patient has any significant symptoms in hospital to the nearest emergency room. 2. Symptoms of orthostatic hypotension: For this reason I have reduced her losartan to 50 mg daily.  Fall precautions were emphasized. 3. Moderate aortic stenosis: Right and left heart catheterization will help assess the valvular heart disease issue also. 4. Mixed dyslipidemia: Diet was emphasized and she promises to stay focused on her diet.  This was  discussed in detail. 5. Sublingual nitroglycerin prescription was sent, its protocol and 911 protocol explained and the patient vocalized understanding questions were answered to the patient's satisfaction.  I told her to use sublingual nitroglycerin prescription was sent, its protocol and 911 protocol explained and the patient vocalized understanding questions were answered to the patient's satisfaction very cautiously in view of borderline blood pressure.  I told her to lie down and use it if necessary.  She understood.   Medication Adjustments/Labs and Tests Ordered: Current medicines are reviewed at length with the patient today.  Concerns regarding medicines are outlined above.  No orders of the defined types were placed in this encounter.  No orders of the defined types were placed in this encounter.    No chief complaint on file.    History of Present Illness:    Shemika Robbs is a 75 y.o. female.  Patient has past medical history of coronary artery disease, moderate aortic stenosis, essential hypertension and dyslipidemia.  She mentions to me that she is having chest pain virtually all day long.  It occurs to be worse on exertion.  She is used nitroglycerin in the past with relief.  She also feels dizzy at times.  No orthopnea or PND.  Chest tightness goes to the neck and to the back.  At the time of my evaluation, the patient is alert awake oriented and in no distress.  Past Medical History:  Diagnosis Date  . Aftercare following surgery 03/26/2020  . Angina    normal stress test  . Anxiety   . Arthritis   . Asthma   . Bronchitis   .  CAD (coronary artery disease) 02/13/2019  . Chest pain 05/08/2019  . Chest pain of uncertain etiology   . Chronic bilateral low back pain without sciatica 07/11/2019   Formatting of this note might be different from the original. Added automatically from request for surgery [id number]  . Chronic obstructive pulmonary disease ([place]) 02/09/2017    Formatting of this note might be different from the original. Last Assessment & Plan:  I will review her PFTs after I obtain records from her pulmonologist. Based on this we will decide if she needs inhalers, subjectively they do not seem to have helped her at all. She may benefit from a pulmonary rehabilitation program Formatting of this note might be different from the original. Last Assessment   . COPD (chronic obstructive pulmonary disease) ([place]) 02/09/2017  . 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 Diabetes mellitus   . Diabetes mellitus due to underlying condition with unspecified complications ([place]) 05/15/107  . Dyslipidemia 06/10/2015  . Esophageal dysphagia 09/02/2019   Formatting of this note might be different from the original. Added automatically from request for surgery [id number]  . Essential hypertension 06/10/2015  . Gastroparesis 09/04/2019  . GERD (gastroesophageal reflux disease)   . H/O hiatal hernia   . Headache 09/24/2018  . Heart murmur   . Hyperlipemia   . Hypertension   . Mixed dyslipidemia 06/10/2015  . Moderate aortic stenosis 05/16/2019  . Nausea and vomiting 06/16/2020  . Nonspecific (abnormal) findings on radiological and other examination of gastrointestinal tract 05/19/2011  . OSA (obstructive sleep apnea) 02/09/2017  . Pain and swelling of toe of right foot 07/15/2016  . Palpitations 06/02/2016  . 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 Added automatically from request for surgery [id number]  . Peritoneal mesothelioma ([place]) 01/16/2020  . S/P lumbar fusion 03/26/2020  . Shortness of breath    on  excertion  . Sleep apnea     Past Surgical History:  Procedure Laterality Date  . ABDOMINAL HYSTERECTOMY    . ABDOMINAL HYSTERECTOMY    . BREAST SURGERY     begin mass,left  breast  . COLONOSCOPY  08/17/2015   Colonic polyp status post polypectomy. Mild  pancolonic diverticulosis. Highly redundant colon.   Marland Kitchen DILATION AND CURETTAGE OF UTERUS     one  . ESOPHAGOGASTRODUODENOSCOPY  08/17/2015   Moderate hiatal hernia. Otherwise noraml EGD.   Marland Kitchen ESOPHAGOGASTRODUODENOSCOPY  09/03/2019   [place]  . EUS  05/19/2011   Procedure: UPPER ENDOSCOPIC ULTRASOUND (EUS) LINEAR;  Surgeon: Owens Loffler, MD;  Location: [facility];  Service: Endoscopy;  Laterality: N/A;  radial linear   . HYSTEROTOMY    . KNEE SURGERY    . LEFT HEART CATH AND CORONARY ANGIOGRAPHY    . LEFT HEART CATH AND CORONARY ANGIOGRAPHY N/A 05/08/2019   Procedure: LEFT HEART CATH AND CORONARY ANGIOGRAPHY;  Surgeon: Burnell Blanks, MD;  Location: [facility];  Service: Cardiovascular;  Laterality: N/A;  . right thumb     tendon repaire  . SHOULDER SURGERY     rotator cuff repair  . TRANSFORAMINAL LUMBAR INTERBODY FUSION L4-5   02/27/2020  . WRIST SURGERY     rigth    Current Medications: Current Meds  Medication Sig  . albuterol (PROVENTIL HFA;VENTOLIN HFA) 108 (90 Base) MCG/ACT inhaler Inhale 2 puffs into the lungs every 6 (six) hours as needed for wheezing or shortness  of breath.  Marland Kitchen amLODipine (NORVASC) 10 MG tablet Take 10 mg by mouth daily.  . budesonide-formoterol (SYMBICORT) 160-4.5 MCG/ACT inhaler Inhale 2 puffs into the lungs 2 (two) times daily.  Marland Kitchen buPROPion (WELLBUTRIN XL) 300 MG 24 hr tablet Take 300 mg by mouth daily.   . canagliflozin (INVOKANA) 300 MG TABS tablet Take 300 mg by mouth daily.  . celecoxib (CELEBREX) 100 MG capsule Take 100-200 mg by mouth daily.  . cetirizine (ZYRTEC) 10 MG tablet Take 1 tablet by mouth at bedtime.  . Cholecalciferol (VITAMIN D3) 100000 UNIT/GM POWD Take 5,000 Units by mouth every 30 (thirty) days.  . clopidogrel (PLAVIX) 75 MG tablet Take 1 tablet (75 mg total) by mouth daily.  . diclofenac Sodium (VOLTAREN) 1 % GEL Apply 4 g topically as needed (pain).  Marland Kitchen dicyclomine (BENTYL) 10 MG capsule Take 10  mg by mouth 2 (two) times daily as needed for spasms.  Marland Kitchen doxycycline (VIBRA-TABS) 100 MG tablet Take 100 mg by mouth daily.  Marland Kitchen escitalopram (LEXAPRO) 10 MG tablet Take 10 mg by mouth daily.  . famotidine (PEPCID) 20 MG tablet Take 20 mg by mouth in the morning and at bedtime.  . fluticasone (FLONASE) 50 MCG/ACT nasal spray Place 2 sprays into both nostrils as needed for allergies.  . fluticasone (VERAMYST) 27.5 MCG/SPRAY nasal spray Place 2 sprays into the nose daily.  . hydrALAZINE (APRESOLINE) 50 MG tablet Take 1 tablet (50 mg total) by mouth 3 (three) times daily.  . hydrochlorothiazide (HYDRODIURIL) 25 MG tablet TAKE 1 TABLET EVERY DAY  . isosorbide mononitrate (IMDUR) 120 MG 24 hr tablet Take 1 tablet (120 mg total) by mouth daily.  Marland Kitchen linaclotide (LINZESS) 72 MCG capsule Take 1 capsule (72 mcg total) by mouth daily before breakfast.  . liraglutide (VICTOZA) 18 MG/3ML SOPN Inject 1.8 mg into the skin daily.  Marland Kitchen losartan (COZAAR) 100 MG tablet Take 1 tablet (100 mg total) by mouth daily.  . montelukast (SINGULAIR) 10 MG tablet Take 10 mg by mouth at bedtime.   . Multiple Vitamin (MULTIVITAMIN) capsule Take 1 capsule by mouth daily.  . nitroGLYCERIN (NITROSTAT) 0.4 MG SL tablet DISSOLVE ONE TABLET UNDER THE TONGUE EVERY 5 MINUTES AS NEEDED FOR CHEST PAIN.  DO NOT EXCEED A TOTAL OF 3 DOSES IN 15 MINUTES  . oxybutynin (DITROPAN) 5 MG tablet Take 10 mg by mouth daily.  . pantoprazole (PROTONIX) 40 MG tablet Take 1 tablet (40 mg total) by mouth 2 (two) times daily.  . Potassium Chloride CR (MICRO-K) 8 MEQ CPCR capsule CR TAKE 1 CAPSULE BY MOUTH ONCE DAILY FOR LOW POTASSIUM  . rosuvastatin (CRESTOR) 20 MG tablet Take 1 tablet (20 mg total) by mouth daily.  Marland Kitchen tiotropium (SPIRIVA) 18 MCG inhalation capsule Place 18 mcg into inhaler and inhale daily.     Allergies:   Penicillins, Gabapentin, and Tramadol   Social History   Socioeconomic History  . Marital status: Single    Spouse name: Not on  file  . Number of children: 3  . Years of education: Not on file  . Highest education level: Not on file  Occupational History  . Occupation: retired  Tobacco Use  . Smoking status: Former Smoker    Packs/day: 1.00    Quit date: 05/17/2011    Years since quitting: 9.0  . Smokeless tobacco: Never Used  Vaping Use  . Vaping Use: Never used  Substance and Sexual Activity  . Alcohol use: No  . Drug use: No  .  Sexual activity: Never  Other Topics Concern  . Not on file  Social History Narrative   Right handed   Social Determinants of Health   Financial Resource Strain: Not on file  Food Insecurity: Not on file  Transportation Needs: Not on file  Physical Activity: Not on file  Stress: Not on file  Social Connections: Not on file     Family History: The patient's family history includes Diabetes in her sister; Hypertension in her daughter. There is no history of Colon cancer.  ROS:   Please see the history of present illness.    All other systems reviewed and are negative.  EKGs/Labs/Other Studies Reviewed:    The following studies were reviewed today: EKG reveals sinus rhythm left ventricular hypertrophy and nonspecific ST-T changes.   Recent Labs: 02/03/2020: ALT 18; BUN 9; Creatinine, Ser 1.04; Potassium 3.9; Sodium 142  Recent Lipid Panel    Component Value Date/Time   CHOL 159 02/03/2020 0937   TRIG 65 02/03/2020 0937   HDL 82 02/03/2020 0937   CHOLHDL 1.9 02/03/2020 0937   LDLCALC 64 02/03/2020 0937    Physical Exam:    VS:  BP (!) 108/58   Pulse 86   Ht 5\' 4"  (1.626 m)   Wt 161 lb (73 kg)   SpO2 98%   BMI 27.64 kg/m     Wt Readings from Last 3 Encounters:  06/17/20 161 lb (73 kg)  06/03/20 158 lb 6 oz (71.8 kg)  04/13/20 158 lb (71.7 kg)     GEN: Patient is in no acute distress HEENT: Normal NECK: No JVD; No carotid bruits LYMPHATICS: No lymphadenopathy CARDIAC: Hear sounds regular, 2/6 systolic murmur at the apex. RESPIRATORY:  Clear to  auscultation without rales, wheezing or rhonchi  ABDOMEN: Soft, non-tender, non-distended MUSCULOSKELETAL:  No edema; No deformity  SKIN: Warm and dry NEUROLOGIC:  Alert and oriented x 3 PSYCHIATRIC:  Normal affect   Signed, [name], MD  06/17/2020 8:26 AM    [place]

## 2020-06-17 NOTE — Patient Instructions (Addendum)
Medication Instructions:  Your physician has recommended you make the following change in your medication:   DECREASE: Losartan to 50 mg daily   *If you need a refill on your cardiac medications before your next appointment, please call your pharmacy*   Lab Work: Your physician recommends that you return for lab work today: bmp, cbc   If you have labs (blood work) drawn today and your tests are completely normal, you will receive your results only by: Marland Kitchen MyChart Message (if you have MyChart) OR . A paper copy in the mail If you have any lab test that is abnormal or we need to change your treatment, we will call you to review the results.   Testing/Procedures: A chest x-ray takes a picture of the organs and structures inside the chest, including the heart, lungs, and blood vessels. This test can show several things, including, whether the heart is enlarges; whether fluid is building up in the lungs; and whether pacemaker / defibrillator leads are still in place.     [place] [address] Dept: [phone] Loc: [place]  06/17/2020  You are scheduled for a Cardiac Catheterization on Tuesday, February 15 with Dr. Peter Martinique.  1. Please arrive at the [facility] (Main Entrance A) at [facility]: [address] [place], [place] [address] at 7:00 AM (This time is two hours before your procedure to ensure your preparation). Free valet parking service is available.   Special note: Every effort is made to have your procedure done on time. Please understand that emergencies sometimes delay scheduled procedures.  2. Diet: Do not eat solid foods after midnight.  The patient may have clear liquids until 5am upon the day of the procedure.  3. Labs: You will have labs drawn today.   4. Medication instructions in preparation for your procedure:    Contrast Allergy: No    HOLD: Invokana, victoza, and hydrochlorothiazide the morning of the cath.   On the morning of your procedure, take your Plavix/Clopidogrel and any morning medicines NOT listed above.  You may use sips of water.  5. Plan for one night stay--bring personal belongings. 6. Bring a current list of your medications and current insurance cards. 7. You MUST have a responsible person to drive you home. 8. Someone MUST be with you the first 24 hours after you arrive home or your discharge will be delayed. 9. Please wear clothes that are easy to get on and off and wear slip-on shoes.  Thank you for allowing [place] to care for you!   -- [facility]    Follow-Up: At [facility], you and your health needs are our priority.  As part of our continuing mission to provide you with exceptional heart care, we have created designated Provider Care Teams.  These Care Teams include your primary Cardiologist (physician) and Advanced Practice Providers (APPs -  Physician Assistants and Nurse Practitioners) who all work together to provide you with the care you need, when you need it.  We recommend signing up for the patient portal called "MyChart".  Sign up information is provided on this After Visit Summary.  MyChart is used to connect with patients for Virtual Visits (Telemedicine).  Patients are able to view lab/test results, encounter notes, upcoming appointments, etc.  Non-urgent messages can be sent to your provider as well.   To learn more about what  you can do with MyChart, go to [email or web address].    Your next appointment:   1 month(s)  The format for your next appointment:   In Person  Provider:   Jyl Heinz, MD   Other Instructions   Coronary Angiogram With Stent Coronary angiogram with stent placement is a procedure to widen or open a narrow blood vessel of the heart (coronary artery). Arteries may become blocked by  cholesterol buildup (plaques) in the lining of the artery wall. When a coronary artery becomes partially blocked, blood flow to that area decreases. This may lead to chest pain or a heart attack (myocardial infarction). A stent is a small piece of metal that looks like mesh or spring. Stent placement may be done as treatment after a heart attack, or to prevent a heart attack if a blocked artery is found by a coronary angiogram. Let your health care provider know about:  Any allergies you have, including allergies to medicines or contrast dye.  All medicines you are taking, including vitamins, herbs, eye drops, creams, and over-the-counter medicines.  Any problems you or family members have had with anesthetic medicines.  Any blood disorders you have.  Any surgeries you have had.  Any medical conditions you have, including kidney problems or kidney failure.  Whether you are pregnant or may be pregnant.  Whether you are breastfeeding. What are the risks? Generally, this is a safe procedure. However, serious problems may occur, including:  Damage to nearby structures or organs, such as the heart, blood vessels, or kidneys.  A return of blockage.  Bleeding, infection, or bruising at the insertion site.  A collection of blood under the skin (hematoma) at the insertion site.  A blood clot in another part of the body.  Allergic reaction to medicines or dyes.  Bleeding into the abdomen (retroperitoneal bleeding).  Stroke (rare).  Heart attack (rare). What happens before the procedure? Staying hydrated Follow instructions from your health care provider about hydration, which may include:  Up to 2 hours before the procedure - you may continue to drink clear liquids, such as water, clear fruit juice, black coffee, and plain tea.   Eating and drinking restrictions Follow instructions from your health care provider about eating and drinking, which may include:  8 hours before the  procedure - stop eating heavy meals or foods, such as meat, fried foods, or fatty foods.  6 hours before the procedure - stop eating light meals or foods, such as toast or cereal.  2 hours before the procedure - stop drinking clear liquids. Medicines Ask your health care provider about:  Changing or stopping your regular medicines. This is especially important if you are taking diabetes medicines or blood thinners.  Taking medicines such as aspirin and ibuprofen. These medicines can thin your blood. Do not take these medicines unless your health care provider tells you to take them. ? Generally, aspirin is recommended before a thin tube, called a catheter, is passed through a blood vessel and inserted into the heart (cardiac catheterization).  Taking over-the-counter medicines, vitamins, herbs, and supplements. General instructions  Do not use any products that contain nicotine or tobacco for at least 4 weeks before the procedure. These products include cigarettes, e-cigarettes, and chewing tobacco. If you need help quitting, ask your health care provider.  Plan to have someone take you home from the hospital or clinic.  If you will be going home right after the procedure, plan to have someone with you for 24 hours.  You may have tests and imaging procedures.  Ask your health care provider: ? How your insertion site will be marked. Ask which artery will be used for the procedure. ? What steps will be taken to help prevent infection. These may include:  Removing hair at the insertion site.  Washing skin with a germ-killing soap.  Taking antibiotic medicine. What happens during the procedure?  An IV will be inserted into one of your veins.  Electrodes may be placed on your chest to monitor your heart rate during the procedure.  You will be given one or more of the following: ? A medicine to help you relax (sedative). ? A medicine to numb the area (local anesthetic) for catheter  insertion.  A small incision will be made for catheter insertion.  The catheter will be inserted into an artery using a guide wire. The location may be in your groin, your wrist, or the fold of your arm (near your elbow).  An X-ray procedure (fluoroscopy) will be used to help guide the catheter to the opening of the heart arteries.  A dye will be injected into the catheter. X-rays will be taken. The dye helps to show where any narrowing or blockages are located in the arteries.  Tell your health care provider if you have chest pain or trouble breathing.  A tiny wire will be guided to the blocked spot, and a balloon will be inflated to make the artery wider.  The stent will be expanded to crush the plaques into the wall of the vessel. The stent will hold the area open and improve the blood flow. Most stents have a drug coating to reduce the risk of the stent narrowing over time.  The artery may be made wider using a drill, laser, or other tools that remove plaques.  The catheter will be removed when the blood flow improves. The stent will stay where it was placed, and the lining of the artery will grow over it.  A bandage (dressing) will be placed on the insertion site. Pressure will be applied to stop bleeding.  The IV will be removed. This procedure may vary among health care providers and hospitals.   What happens after the procedure?  Your blood pressure, heart rate, breathing rate, and blood oxygen level will be monitored until you leave the hospital or clinic.  If the procedure is done through the leg, you will lie flat in bed for a few hours or for as long as told by your health care provider. You will be instructed not to bend or cross your legs.  The insertion site and the pulse in your foot or wrist will be checked often.  You may have more blood tests, X-rays, and a test that records the electrical activity of your heart (electrocardiogram, or ECG).  Do not drive for 24  hours if you were given a sedative during your procedure. Summary  Coronary angiogram with stent placement is a procedure to widen or open a narrowed coronary artery. This is done to treat heart problems.  Before the procedure, let your health care provider know about all the medical conditions and surgeries you have or have had.  This is a safe procedure. However, some problems may occur, including damage to nearby structures or organs, bleeding, blood clots, or allergies.  Follow your health care provider's instructions about eating, drinking, medicines, and other lifestyle changes, such as quitting tobacco use before the procedure. This information is not intended to replace advice  given to you by your health care provider. Make sure you discuss any questions you have with your health care provider. Document Revised: 11/14/2018 Document Reviewed: 11/14/2018 Elsevier Patient Education  [place].

## 2020-06-17 NOTE — Telephone Encounter (Signed)
Spoke to [place] just now and let her know that the patient has Nitroglycerin to use as needed for chest pain. I asked if they needed a refill of this but was told that she just picked it up from the pharmacy.    Encouraged patient to call back with any questions or concerns.

## 2020-06-17 NOTE — Telephone Encounter (Signed)
Patients daughter is calling stating that they just left from seeing Dr. Geraldo Pitter and they forget to ask him what medication she should take if she starts having chest pain. Please advise.

## 2020-06-18 LAB — CBC
Hematocrit: 33.7 % — ABNORMAL LOW (ref 34.0–46.6)
Hemoglobin: 10.7 g/dL — ABNORMAL LOW (ref 11.1–15.9)
MCH: 25.8 pg — ABNORMAL LOW (ref 26.6–33.0)
MCHC: 31.8 g/dL (ref 31.5–35.7)
MCV: 81 fL (ref 79–97)
Platelets: 329 10*3/uL (ref 150–450)
RBC: 4.14 x10E6/uL (ref 3.77–5.28)
RDW: 15.3 % (ref 11.7–15.4)
WBC: 4.8 10*3/uL (ref 3.4–10.8)

## 2020-06-18 LAB — BASIC METABOLIC PANEL
BUN/Creatinine Ratio: 14 (ref 12–28)
BUN: 16 mg/dL (ref 8–27)
CO2: 24 mmol/L (ref 20–29)
Calcium: 9.4 mg/dL (ref 8.7–10.3)
Chloride: 103 mmol/L (ref 96–106)
Creatinine, Ser: 1.12 mg/dL — ABNORMAL HIGH (ref 0.57–1.00)
GFR calc Af Amer: 55 mL/min/{1.73_m2} — ABNORMAL LOW (ref >59)
GFR calc non Af Amer: 48 mL/min/{1.73_m2} — ABNORMAL LOW (ref >59)
Glucose: 111 mg/dL — ABNORMAL HIGH (ref 65–99)
Potassium: 3.8 mmol/L (ref 3.5–5.2)
Sodium: 142 mmol/L (ref 134–144)

## 2020-06-18 LAB — SPECIMEN STATUS REPORT

## 2020-06-19 ENCOUNTER — Telehealth: Payer: Self-pay | Admitting: Cardiology

## 2020-06-19 DIAGNOSIS — K2100 Gastro-esophageal reflux disease with esophagitis, without bleeding: Secondary | ICD-10-CM | POA: Diagnosis not present

## 2020-06-19 DIAGNOSIS — E1142 Type 2 diabetes mellitus with diabetic polyneuropathy: Secondary | ICD-10-CM | POA: Diagnosis not present

## 2020-06-19 DIAGNOSIS — I2510 Atherosclerotic heart disease of native coronary artery without angina pectoris: Secondary | ICD-10-CM | POA: Diagnosis not present

## 2020-06-19 DIAGNOSIS — U071 COVID-19: Secondary | ICD-10-CM | POA: Diagnosis not present

## 2020-06-19 DIAGNOSIS — G4700 Insomnia, unspecified: Secondary | ICD-10-CM | POA: Diagnosis not present

## 2020-06-19 DIAGNOSIS — M150 Primary generalized (osteo)arthritis: Secondary | ICD-10-CM | POA: Diagnosis not present

## 2020-06-19 DIAGNOSIS — I10 Essential (primary) hypertension: Secondary | ICD-10-CM | POA: Diagnosis not present

## 2020-06-19 DIAGNOSIS — E785 Hyperlipidemia, unspecified: Secondary | ICD-10-CM | POA: Diagnosis not present

## 2020-06-19 DIAGNOSIS — L240 Irritant contact dermatitis due to detergents: Secondary | ICD-10-CM | POA: Diagnosis not present

## 2020-06-19 NOTE — Telephone Encounter (Signed)
Is the patient taking a diuretic at this time

## 2020-06-19 NOTE — Telephone Encounter (Signed)
Called patient. Informed her that per Dr. Geraldo Pitter she does not need to be on a diuretic since she is not taking one. She verbally understood. No further questions.

## 2020-06-19 NOTE — Telephone Encounter (Signed)
Pt c/o medication issue:  1. Name of Medication: losartan (COZAAR) 50 MG tablet [[id number]]   2. How are you currently taking this medication (dosage and times per day)? Take 1 tablet (50 mg total) by mouth daily.  3. Are you having a reaction (difficulty breathing--STAT)? No   4. What is your medication issue? Pt stated this med was changed and she wanted to know if she is suppose to be taking a water pill with this med now that it has been changed    Best number [phone]

## 2020-06-19 NOTE — Telephone Encounter (Signed)
She can keep off the diuretic medicine if she is not taking it at this time

## 2020-06-19 NOTE — Telephone Encounter (Signed)
Dr. Geraldo Pitter decreased her losartan to 50 mg on Wednesday at the office visit. No mention of a diuretic being added will check with him.

## 2020-06-20 ENCOUNTER — Other Ambulatory Visit (HOSPITAL_COMMUNITY)
Admission: RE | Admit: 2020-06-20 | Discharge: 2020-06-20 | Disposition: A | Discharge status: Home / Self Care | Payer: Medicare HMO | Source: Ambulatory Visit | Attending: Cardiology | Admitting: Cardiology

## 2020-06-20 DIAGNOSIS — Z01812 Encounter for preprocedural laboratory examination: Secondary | ICD-10-CM | POA: Diagnosis not present

## 2020-06-20 DIAGNOSIS — Z20822 Contact with and (suspected) exposure to covid-19: Secondary | ICD-10-CM | POA: Insufficient documentation

## 2020-06-20 LAB — SARS CORONAVIRUS 2 (TAT 6-24 HRS): SARS Coronavirus 2: NEGATIVE

## 2020-06-22 NOTE — Progress Notes (Signed)
Spoke with patient regarding procedure instructions for tomorrow's procedure.  Nothing to eat or drink after midnight, ok to take medications in the morning except diabetes medications.  You will need responsible adult to drive you home tomorrow as well as stay overnight with you.  Arrival time is 7:00

## 2020-06-23 ENCOUNTER — Other Ambulatory Visit: Payer: Self-pay

## 2020-06-23 ENCOUNTER — Ambulatory Visit (HOSPITAL_COMMUNITY)
Admission: RE | Disposition: A | Discharge status: Home / Self Care | Payer: Self-pay | Source: Home / Self Care | Attending: Cardiology

## 2020-06-23 ENCOUNTER — Ambulatory Visit (HOSPITAL_COMMUNITY)
Admission: RE | Admit: 2020-06-23 | Discharge: 2020-06-23 | Disposition: A | Discharge status: Home / Self Care | Payer: Medicare HMO | Attending: Cardiology | Admitting: Cardiology

## 2020-06-23 DIAGNOSIS — Z880 Allergy status to penicillin: Secondary | ICD-10-CM | POA: Insufficient documentation

## 2020-06-23 DIAGNOSIS — E782 Mixed hyperlipidemia: Secondary | ICD-10-CM | POA: Diagnosis present

## 2020-06-23 DIAGNOSIS — I10 Essential (primary) hypertension: Secondary | ICD-10-CM | POA: Diagnosis present

## 2020-06-23 DIAGNOSIS — I2510 Atherosclerotic heart disease of native coronary artery without angina pectoris: Secondary | ICD-10-CM | POA: Diagnosis not present

## 2020-06-23 DIAGNOSIS — I25119 Atherosclerotic heart disease of native coronary artery with unspecified angina pectoris: Secondary | ICD-10-CM | POA: Insufficient documentation

## 2020-06-23 DIAGNOSIS — Z7984 Long term (current) use of oral hypoglycemic drugs: Secondary | ICD-10-CM | POA: Insufficient documentation

## 2020-06-23 DIAGNOSIS — Z885 Allergy status to narcotic agent status: Secondary | ICD-10-CM | POA: Diagnosis not present

## 2020-06-23 DIAGNOSIS — Z79899 Other long term (current) drug therapy: Secondary | ICD-10-CM | POA: Diagnosis not present

## 2020-06-23 DIAGNOSIS — Z87891 Personal history of nicotine dependence: Secondary | ICD-10-CM | POA: Insufficient documentation

## 2020-06-23 DIAGNOSIS — I350 Nonrheumatic aortic (valve) stenosis: Secondary | ICD-10-CM | POA: Diagnosis not present

## 2020-06-23 DIAGNOSIS — E119 Type 2 diabetes mellitus without complications: Secondary | ICD-10-CM | POA: Diagnosis not present

## 2020-06-23 DIAGNOSIS — Z7902 Long term (current) use of antithrombotics/antiplatelets: Secondary | ICD-10-CM | POA: Diagnosis not present

## 2020-06-23 DIAGNOSIS — I209 Angina pectoris, unspecified: Secondary | ICD-10-CM | POA: Diagnosis present

## 2020-06-23 DIAGNOSIS — E088 Diabetes mellitus due to underlying condition with unspecified complications: Secondary | ICD-10-CM

## 2020-06-23 DIAGNOSIS — G4733 Obstructive sleep apnea (adult) (pediatric): Secondary | ICD-10-CM | POA: Diagnosis present

## 2020-06-23 DIAGNOSIS — J449 Chronic obstructive pulmonary disease, unspecified: Secondary | ICD-10-CM | POA: Diagnosis present

## 2020-06-23 HISTORY — PX: RIGHT/LEFT HEART CATH AND CORONARY ANGIOGRAPHY: CATH118266

## 2020-06-23 LAB — GLUCOSE, CAPILLARY
Glucose-Capillary: 90 mg/dL (ref 70–99)
Glucose-Capillary: 64 mg/dL — ABNORMAL LOW (ref 70–99)
Glucose-Capillary: 82 mg/dL (ref 70–99)

## 2020-06-23 LAB — POCT I-STAT 7, (LYTES, BLD GAS, ICA,H+H)
Acid-Base Excess: 0 mmol/L (ref 0.0–2.0)
Bicarbonate: 25 mmol/L (ref 20.0–28.0)
Calcium, Ion: 1.24 mmol/L (ref 1.15–1.40)
HCT: 29 % — ABNORMAL LOW (ref 36.0–46.0)
Hemoglobin: 9.9 g/dL — ABNORMAL LOW (ref 12.0–15.0)
O2 Saturation: 99 %
Potassium: 3.9 mmol/L (ref 3.5–5.1)
Sodium: 145 mmol/L (ref 135–145)
TCO2: 26 mmol/L (ref 22–32)
pCO2 arterial: 40.3 mmHg (ref 32.0–48.0)
pH, Arterial: 7.399 (ref 7.350–7.450)
pO2, Arterial: 160 mmHg — ABNORMAL HIGH (ref 83.0–108.0)

## 2020-06-23 LAB — POCT I-STAT EG7
Acid-Base Excess: 1 mmol/L (ref 0.0–2.0)
Bicarbonate: 26 mmol/L (ref 20.0–28.0)
Calcium, Ion: 1.27 mmol/L (ref 1.15–1.40)
HCT: 29 % — ABNORMAL LOW (ref 36.0–46.0)
Hemoglobin: 9.9 g/dL — ABNORMAL LOW (ref 12.0–15.0)
O2 Saturation: 74 %
Potassium: 4 mmol/L (ref 3.5–5.1)
Sodium: 144 mmol/L (ref 135–145)
TCO2: 27 mmol/L (ref 22–32)
pCO2, Ven: 44.1 mmHg (ref 44.0–60.0)
pH, Ven: 7.38 (ref 7.250–7.430)
pO2, Ven: 40 mmHg (ref 32.0–45.0)

## 2020-06-23 SURGERY — RIGHT/LEFT HEART CATH AND CORONARY ANGIOGRAPHY
Anesthesia: LOCAL

## 2020-06-23 MED ORDER — VERAPAMIL HCL 2.5 MG/ML IV SOLN
INTRAVENOUS | Status: AC
  Filled 2020-06-23: qty 2

## 2020-06-23 MED ORDER — ASPIRIN 81 MG PO CHEW
CHEWABLE_TABLET | ORAL | Status: AC
  Administered 2020-06-23: 81 mg via ORAL
  Filled 2020-06-23: qty 1

## 2020-06-23 MED ORDER — FENTANYL CITRATE (PF) 100 MCG/2ML IJ SOLN
INTRAMUSCULAR | Status: DC | PRN
  Administered 2020-06-23: 25 ug via INTRAVENOUS

## 2020-06-23 MED ORDER — ACETAMINOPHEN 325 MG PO TABS: 650.0000 mg | ORAL_TABLET | ORAL | Status: DC | PRN

## 2020-06-23 MED ORDER — SODIUM CHLORIDE 0.9 % WEIGHT BASED INFUSION
3.0000 mL/kg/h | INTRAVENOUS | Status: AC
  Administered 2020-06-23: 3 mL/kg/h via INTRAVENOUS

## 2020-06-23 MED ORDER — HYDRALAZINE HCL 20 MG/ML IJ SOLN
10.0000 mg | INTRAMUSCULAR | Status: DC | PRN
  Administered 2020-06-23: 10 mg via INTRAVENOUS

## 2020-06-23 MED ORDER — LIDOCAINE HCL (PF) 1 % IJ SOLN
INTRAMUSCULAR | Status: AC
  Filled 2020-06-23: qty 30

## 2020-06-23 MED ORDER — SODIUM CHLORIDE 0.9% FLUSH: 3.0000 mL | Freq: Two times a day (BID) | INTRAVENOUS | Status: DC

## 2020-06-23 MED ORDER — ONDANSETRON HCL 4 MG/2ML IJ SOLN: 4.0000 mg | Freq: Four times a day (QID) | INTRAMUSCULAR | Status: DC | PRN

## 2020-06-23 MED ORDER — ASPIRIN 81 MG PO CHEW: 81.0000 mg | CHEWABLE_TABLET | ORAL | Status: AC

## 2020-06-23 MED ORDER — SODIUM CHLORIDE 0.9 % IV SOLN: 250.0000 mL | INTRAVENOUS | Status: DC | PRN

## 2020-06-23 MED ORDER — FENTANYL CITRATE (PF) 100 MCG/2ML IJ SOLN
INTRAMUSCULAR | Status: AC
  Filled 2020-06-23: qty 2

## 2020-06-23 MED ORDER — SODIUM CHLORIDE 0.9 % WEIGHT BASED INFUSION: 1.0000 mL/kg/h | INTRAVENOUS | Status: DC

## 2020-06-23 MED ORDER — MIDAZOLAM HCL 2 MG/2ML IJ SOLN
INTRAMUSCULAR | Status: AC
  Filled 2020-06-23: qty 2

## 2020-06-23 MED ORDER — HEPARIN (PORCINE) IN NACL 1000-0.9 UT/500ML-% IV SOLN
INTRAVENOUS | Status: AC
  Filled 2020-06-23: qty 1000

## 2020-06-23 MED ORDER — SODIUM CHLORIDE 0.9% FLUSH: 3.0000 mL | INTRAVENOUS | Status: DC | PRN

## 2020-06-23 MED ORDER — MIDAZOLAM HCL 2 MG/2ML IJ SOLN
INTRAMUSCULAR | Status: DC | PRN
  Administered 2020-06-23: 2 mg via INTRAVENOUS

## 2020-06-23 MED ORDER — LIDOCAINE HCL (PF) 1 % IJ SOLN
INTRAMUSCULAR | Status: DC | PRN
  Administered 2020-06-23: 15 mL
  Administered 2020-06-23: 5 mL

## 2020-06-23 MED ORDER — SODIUM CHLORIDE 0.9 % WEIGHT BASED INFUSION
1.0000 mL/kg/h | INTRAVENOUS | Status: AC
  Administered 2020-06-23: 1 mL/kg/h via INTRAVENOUS

## 2020-06-23 MED ORDER — HYDRALAZINE HCL 20 MG/ML IJ SOLN
INTRAMUSCULAR | Status: AC
  Filled 2020-06-23: qty 1

## 2020-06-23 MED ORDER — HEPARIN (PORCINE) IN NACL 1000-0.9 UT/500ML-% IV SOLN
INTRAVENOUS | Status: DC | PRN
  Administered 2020-06-23 (×2): 500 mL

## 2020-06-23 SURGICAL SUPPLY — 16 items
BAG SNAP BAND KOVER 36X36 (MISCELLANEOUS) ×2 IMPLANT
CATH INFINITI 5FR MULTPACK ANG (CATHETERS) ×2 IMPLANT
CATH SWAN GANZ 7F STRAIGHT (CATHETERS) ×2 IMPLANT
COVER DOME SNAP 22 D (MISCELLANEOUS) ×2 IMPLANT
GLIDESHEATH SLEND SS 6F .021 (SHEATH) IMPLANT
GUIDEWIRE INQWIRE 1.5J.035X260 (WIRE) IMPLANT
INQWIRE 1.5J .035X260CM (WIRE)
KIT HEART LEFT (KITS) ×2 IMPLANT
PACK CARDIAC CATHETERIZATION (CUSTOM PROCEDURE TRAY) ×2 IMPLANT
SHEATH GLIDE SLENDER 4/5FR (SHEATH) IMPLANT
SHEATH PINNACLE 5F 10CM (SHEATH) ×2 IMPLANT
SHEATH PINNACLE 7F 10CM (SHEATH) ×2 IMPLANT
SHEATH PROBE COVER 6X72 (BAG) ×2 IMPLANT
TRANSDUCER W/STOPCOCK (MISCELLANEOUS) ×2 IMPLANT
TUBING CIL FLEX 10 FLL-RA (TUBING) ×2 IMPLANT
WIRE EMERALD 3MM-J .035X150CM (WIRE) ×2 IMPLANT

## 2020-06-23 NOTE — Progress Notes (Signed)
bsl rechecked and resulted at 82, pt remains asymptomatic, safety maintained

## 2020-06-23 NOTE — Discharge Instructions (Signed)

## 2020-06-23 NOTE — Progress Notes (Signed)
Site area: rt groin fa and fv sheaths pulled by Wynonia Sours; last 5 minutes held by [employer] Prior to Removal:  Level 0 Pressure Applied For: 20 minutes Manual:   yes Patient Status During Pull:  stable Post Pull Site:  Level 0 Post Pull Instructions Given:  yes Post Pull Pulses Present: rt dp palpable Dressing Applied:  Gauze and tegaderm Bedrest begins @ 0355 Comments:

## 2020-06-23 NOTE — Interval H&P Note (Signed)
History and Physical Interval Note:  06/23/2020 9:23 AM  Deborah Hutchinson  has presented today for surgery, with the diagnosis of angina.  The various methods of treatment have been discussed with the patient and family. After consideration of risks, benefits and other options for treatment, the patient has consented to  Procedure(s): RIGHT/LEFT HEART CATH AND CORONARY ANGIOGRAPHY (N/A) as a surgical intervention.  The patient's history has been reviewed, patient examined, no change in status, stable for surgery.  I have reviewed the patient's chart and labs.  Questions were answered to the patient's satisfaction.    Cath Lab Visit (complete for each Cath Lab visit)  Clinical Evaluation Leading to the Procedure:   ACS: No.  Non-ACS:    Anginal Classification: CCS III  Anti-ischemic medical therapy: Maximal Therapy (2 or more classes of medications)  Non-Invasive Test Results: No non-invasive testing performed  Prior CABG: No previous CABG       Collier Salina [facility] 06/23/2020 9:23 AM

## 2020-06-23 NOTE — Research (Signed)
[place] Informed Consent   Subject Name: Deborah Hutchinson  Subject met inclusion and exclusion criteria.  The informed consent form, study requirements and expectations were reviewed with the subject and questions and concerns were addressed prior to the signing of the consent form.  The subject verbalized understanding of the trail requirements.  The subject agreed to participate in the [facility] trial and signed the informed consent.  The informed consent was obtained prior to performance of any protocol-specific procedures for the subject.  A copy of the signed informed consent was given to the subject and a copy was placed in the subject's medical record.  [name]. 06/23/2020, 0720 am

## 2020-06-23 NOTE — Progress Notes (Signed)
Patient states she fell on Friday 2/11.  She did not seek medical treatment.  Patient states that her right wrist is sore as well as her right knee.  She is able to move all extremities.  Notified Dr Martinique, no new orders.

## 2020-06-23 NOTE — Progress Notes (Signed)
bsl checked and resulted at 64, pt asymptomatic, grape juice given, tolerated well, will recheck bsl, safety maintained

## 2020-06-24 ENCOUNTER — Encounter (HOSPITAL_COMMUNITY): Payer: Self-pay | Admitting: Cardiology

## 2020-06-24 MED FILL — Verapamil HCl IV Soln 2.5 MG/ML: INTRAVENOUS | Qty: 2 | Status: AC

## 2020-06-24 MED FILL — Lidocaine HCl Local Preservative Free (PF) Inj 1%: INTRAMUSCULAR | Qty: 30 | Status: AC

## 2020-06-25 DIAGNOSIS — M5459 Other low back pain: Secondary | ICD-10-CM | POA: Diagnosis not present

## 2020-06-25 DIAGNOSIS — M25431 Effusion, right wrist: Secondary | ICD-10-CM | POA: Diagnosis not present

## 2020-06-25 DIAGNOSIS — Z6826 Body mass index (BMI) 26.0-26.9, adult: Secondary | ICD-10-CM | POA: Diagnosis not present

## 2020-06-25 DIAGNOSIS — B372 Candidiasis of skin and nail: Secondary | ICD-10-CM | POA: Diagnosis not present

## 2020-06-25 DIAGNOSIS — M25521 Pain in right elbow: Secondary | ICD-10-CM | POA: Diagnosis not present

## 2020-06-25 DIAGNOSIS — M19031 Primary osteoarthritis, right wrist: Secondary | ICD-10-CM | POA: Diagnosis not present

## 2020-06-25 DIAGNOSIS — M4316 Spondylolisthesis, lumbar region: Secondary | ICD-10-CM | POA: Diagnosis not present

## 2020-06-25 DIAGNOSIS — Z9181 History of falling: Secondary | ICD-10-CM | POA: Diagnosis not present

## 2020-06-25 DIAGNOSIS — M25531 Pain in right wrist: Secondary | ICD-10-CM | POA: Diagnosis not present

## 2020-06-25 DIAGNOSIS — S6991XA Unspecified injury of right wrist, hand and finger(s), initial encounter: Secondary | ICD-10-CM | POA: Diagnosis not present

## 2020-06-25 DIAGNOSIS — M25421 Effusion, right elbow: Secondary | ICD-10-CM | POA: Diagnosis not present

## 2020-06-25 DIAGNOSIS — E663 Overweight: Secondary | ICD-10-CM | POA: Diagnosis not present

## 2020-06-25 DIAGNOSIS — I10 Essential (primary) hypertension: Secondary | ICD-10-CM | POA: Diagnosis not present

## 2020-06-26 ENCOUNTER — Ambulatory Visit: Payer: Medicare HMO | Admitting: Cardiology

## 2020-06-26 DIAGNOSIS — M25561 Pain in right knee: Secondary | ICD-10-CM | POA: Diagnosis not present

## 2020-06-26 DIAGNOSIS — M25461 Effusion, right knee: Secondary | ICD-10-CM | POA: Diagnosis not present

## 2020-06-29 DIAGNOSIS — G4700 Insomnia, unspecified: Secondary | ICD-10-CM | POA: Diagnosis not present

## 2020-06-29 DIAGNOSIS — M150 Primary generalized (osteo)arthritis: Secondary | ICD-10-CM | POA: Diagnosis not present

## 2020-06-29 DIAGNOSIS — L240 Irritant contact dermatitis due to detergents: Secondary | ICD-10-CM | POA: Diagnosis not present

## 2020-06-29 DIAGNOSIS — K2100 Gastro-esophageal reflux disease with esophagitis, without bleeding: Secondary | ICD-10-CM | POA: Diagnosis not present

## 2020-06-29 DIAGNOSIS — I10 Essential (primary) hypertension: Secondary | ICD-10-CM | POA: Diagnosis not present

## 2020-06-29 DIAGNOSIS — E785 Hyperlipidemia, unspecified: Secondary | ICD-10-CM | POA: Diagnosis not present

## 2020-06-29 DIAGNOSIS — U071 COVID-19: Secondary | ICD-10-CM | POA: Diagnosis not present

## 2020-06-29 DIAGNOSIS — I2510 Atherosclerotic heart disease of native coronary artery without angina pectoris: Secondary | ICD-10-CM | POA: Diagnosis not present

## 2020-06-29 DIAGNOSIS — E1142 Type 2 diabetes mellitus with diabetic polyneuropathy: Secondary | ICD-10-CM | POA: Diagnosis not present

## 2020-06-30 DIAGNOSIS — N644 Mastodynia: Secondary | ICD-10-CM | POA: Diagnosis not present

## 2020-06-30 DIAGNOSIS — M25561 Pain in right knee: Secondary | ICD-10-CM | POA: Diagnosis not present

## 2020-06-30 DIAGNOSIS — H52209 Unspecified astigmatism, unspecified eye: Secondary | ICD-10-CM | POA: Diagnosis not present

## 2020-06-30 DIAGNOSIS — E663 Overweight: Secondary | ICD-10-CM | POA: Diagnosis not present

## 2020-06-30 DIAGNOSIS — B372 Candidiasis of skin and nail: Secondary | ICD-10-CM | POA: Diagnosis not present

## 2020-06-30 DIAGNOSIS — M25431 Effusion, right wrist: Secondary | ICD-10-CM | POA: Diagnosis not present

## 2020-06-30 DIAGNOSIS — M25531 Pain in right wrist: Secondary | ICD-10-CM | POA: Diagnosis not present

## 2020-06-30 DIAGNOSIS — H5203 Hypermetropia, bilateral: Secondary | ICD-10-CM | POA: Diagnosis not present

## 2020-06-30 DIAGNOSIS — M940 Chondrocostal junction syndrome [Tietze]: Secondary | ICD-10-CM | POA: Diagnosis not present

## 2020-06-30 DIAGNOSIS — H524 Presbyopia: Secondary | ICD-10-CM | POA: Diagnosis not present

## 2020-06-30 DIAGNOSIS — Z6826 Body mass index (BMI) 26.0-26.9, adult: Secondary | ICD-10-CM | POA: Diagnosis not present

## 2020-06-30 DIAGNOSIS — M25461 Effusion, right knee: Secondary | ICD-10-CM | POA: Diagnosis not present

## 2020-07-01 ENCOUNTER — Other Ambulatory Visit: Payer: Self-pay | Admitting: Gastroenterology

## 2020-07-02 DIAGNOSIS — I10 Essential (primary) hypertension: Secondary | ICD-10-CM | POA: Diagnosis not present

## 2020-07-02 DIAGNOSIS — I2510 Atherosclerotic heart disease of native coronary artery without angina pectoris: Secondary | ICD-10-CM | POA: Diagnosis not present

## 2020-07-02 DIAGNOSIS — K2100 Gastro-esophageal reflux disease with esophagitis, without bleeding: Secondary | ICD-10-CM | POA: Diagnosis not present

## 2020-07-02 DIAGNOSIS — E1142 Type 2 diabetes mellitus with diabetic polyneuropathy: Secondary | ICD-10-CM | POA: Diagnosis not present

## 2020-07-02 DIAGNOSIS — U071 COVID-19: Secondary | ICD-10-CM | POA: Diagnosis not present

## 2020-07-02 DIAGNOSIS — G4700 Insomnia, unspecified: Secondary | ICD-10-CM | POA: Diagnosis not present

## 2020-07-02 DIAGNOSIS — M150 Primary generalized (osteo)arthritis: Secondary | ICD-10-CM | POA: Diagnosis not present

## 2020-07-02 DIAGNOSIS — L240 Irritant contact dermatitis due to detergents: Secondary | ICD-10-CM | POA: Diagnosis not present

## 2020-07-02 DIAGNOSIS — E785 Hyperlipidemia, unspecified: Secondary | ICD-10-CM | POA: Diagnosis not present

## 2020-07-03 DIAGNOSIS — J069 Acute upper respiratory infection, unspecified: Secondary | ICD-10-CM | POA: Diagnosis not present

## 2020-07-03 DIAGNOSIS — Z20822 Contact with and (suspected) exposure to covid-19: Secondary | ICD-10-CM | POA: Diagnosis not present

## 2020-07-06 DIAGNOSIS — E785 Hyperlipidemia, unspecified: Secondary | ICD-10-CM | POA: Diagnosis not present

## 2020-07-06 DIAGNOSIS — I2510 Atherosclerotic heart disease of native coronary artery without angina pectoris: Secondary | ICD-10-CM | POA: Diagnosis not present

## 2020-07-06 DIAGNOSIS — U071 COVID-19: Secondary | ICD-10-CM | POA: Diagnosis not present

## 2020-07-06 DIAGNOSIS — G4700 Insomnia, unspecified: Secondary | ICD-10-CM | POA: Diagnosis not present

## 2020-07-06 DIAGNOSIS — E1142 Type 2 diabetes mellitus with diabetic polyneuropathy: Secondary | ICD-10-CM | POA: Diagnosis not present

## 2020-07-06 DIAGNOSIS — K2100 Gastro-esophageal reflux disease with esophagitis, without bleeding: Secondary | ICD-10-CM | POA: Diagnosis not present

## 2020-07-06 DIAGNOSIS — M150 Primary generalized (osteo)arthritis: Secondary | ICD-10-CM | POA: Diagnosis not present

## 2020-07-06 DIAGNOSIS — I10 Essential (primary) hypertension: Secondary | ICD-10-CM | POA: Diagnosis not present

## 2020-07-06 DIAGNOSIS — L240 Irritant contact dermatitis due to detergents: Secondary | ICD-10-CM | POA: Diagnosis not present

## 2020-07-07 DIAGNOSIS — M1990 Unspecified osteoarthritis, unspecified site: Secondary | ICD-10-CM | POA: Diagnosis not present

## 2020-07-07 DIAGNOSIS — M5440 Lumbago with sciatica, unspecified side: Secondary | ICD-10-CM | POA: Diagnosis not present

## 2020-07-07 DIAGNOSIS — I2510 Atherosclerotic heart disease of native coronary artery without angina pectoris: Secondary | ICD-10-CM | POA: Diagnosis not present

## 2020-07-07 DIAGNOSIS — G5793 Unspecified mononeuropathy of bilateral lower limbs: Secondary | ICD-10-CM | POA: Diagnosis not present

## 2020-07-15 DIAGNOSIS — Z6826 Body mass index (BMI) 26.0-26.9, adult: Secondary | ICD-10-CM | POA: Diagnosis not present

## 2020-07-15 DIAGNOSIS — M940 Chondrocostal junction syndrome [Tietze]: Secondary | ICD-10-CM | POA: Diagnosis not present

## 2020-07-15 DIAGNOSIS — N644 Mastodynia: Secondary | ICD-10-CM | POA: Diagnosis not present

## 2020-07-15 DIAGNOSIS — E1140 Type 2 diabetes mellitus with diabetic neuropathy, unspecified: Secondary | ICD-10-CM | POA: Diagnosis not present

## 2020-07-15 DIAGNOSIS — G4700 Insomnia, unspecified: Secondary | ICD-10-CM | POA: Diagnosis not present

## 2020-07-15 DIAGNOSIS — K5909 Other constipation: Secondary | ICD-10-CM | POA: Diagnosis not present

## 2020-07-15 DIAGNOSIS — B372 Candidiasis of skin and nail: Secondary | ICD-10-CM | POA: Diagnosis not present

## 2020-07-15 DIAGNOSIS — E663 Overweight: Secondary | ICD-10-CM | POA: Diagnosis not present

## 2020-07-16 ENCOUNTER — Other Ambulatory Visit: Payer: Self-pay

## 2020-07-16 DIAGNOSIS — L240 Irritant contact dermatitis due to detergents: Secondary | ICD-10-CM | POA: Diagnosis not present

## 2020-07-16 DIAGNOSIS — U071 COVID-19: Secondary | ICD-10-CM | POA: Diagnosis not present

## 2020-07-16 DIAGNOSIS — K2100 Gastro-esophageal reflux disease with esophagitis, without bleeding: Secondary | ICD-10-CM | POA: Diagnosis not present

## 2020-07-16 DIAGNOSIS — E1142 Type 2 diabetes mellitus with diabetic polyneuropathy: Secondary | ICD-10-CM | POA: Diagnosis not present

## 2020-07-16 DIAGNOSIS — M150 Primary generalized (osteo)arthritis: Secondary | ICD-10-CM | POA: Diagnosis not present

## 2020-07-16 DIAGNOSIS — I10 Essential (primary) hypertension: Secondary | ICD-10-CM | POA: Diagnosis not present

## 2020-07-16 DIAGNOSIS — E785 Hyperlipidemia, unspecified: Secondary | ICD-10-CM | POA: Diagnosis not present

## 2020-07-16 DIAGNOSIS — I2510 Atherosclerotic heart disease of native coronary artery without angina pectoris: Secondary | ICD-10-CM | POA: Diagnosis not present

## 2020-07-16 DIAGNOSIS — G4700 Insomnia, unspecified: Secondary | ICD-10-CM | POA: Diagnosis not present

## 2020-07-17 ENCOUNTER — Encounter: Payer: Self-pay | Admitting: Cardiology

## 2020-07-17 ENCOUNTER — Ambulatory Visit (INDEPENDENT_AMBULATORY_CARE_PROVIDER_SITE_OTHER): Payer: Medicare HMO | Admitting: Cardiology

## 2020-07-17 ENCOUNTER — Ambulatory Visit: Payer: Medicare HMO | Admitting: Cardiology

## 2020-07-17 ENCOUNTER — Other Ambulatory Visit: Payer: Self-pay

## 2020-07-17 VITALS — BP 156/76 | HR 76 | Ht 64.0 in | Wt 166.4 lb

## 2020-07-17 DIAGNOSIS — I350 Nonrheumatic aortic (valve) stenosis: Secondary | ICD-10-CM

## 2020-07-17 DIAGNOSIS — I10 Essential (primary) hypertension: Secondary | ICD-10-CM

## 2020-07-17 DIAGNOSIS — I2510 Atherosclerotic heart disease of native coronary artery without angina pectoris: Secondary | ICD-10-CM

## 2020-07-17 DIAGNOSIS — E782 Mixed hyperlipidemia: Secondary | ICD-10-CM | POA: Diagnosis not present

## 2020-07-17 DIAGNOSIS — E088 Diabetes mellitus due to underlying condition with unspecified complications: Secondary | ICD-10-CM

## 2020-07-17 IMAGING — RF DG UGI W/ HIGH DENSITY W/O KUB
10 of 13 series · 15 of 22 positions shown · non-contrast
Comparison: December 31, 2018

CLINICAL DATA: Gastroesophageal reflux disease, dysphagia

EXAM:
UPPER GI SERIES WITHOUT KUB
TECHNIQUE: Routine upper GI series was performed with thin and high density
barium.
FLUOROSCOPY TIME:  Fluoroscopy Time:  1 minute
Radiation Exposure Index (if provided by the fluoroscopic device):
18.8 mGy

[Series 1: t abdomen supine · 0.15mm/px · 1 of 1 slices shown]
[im 1/1]
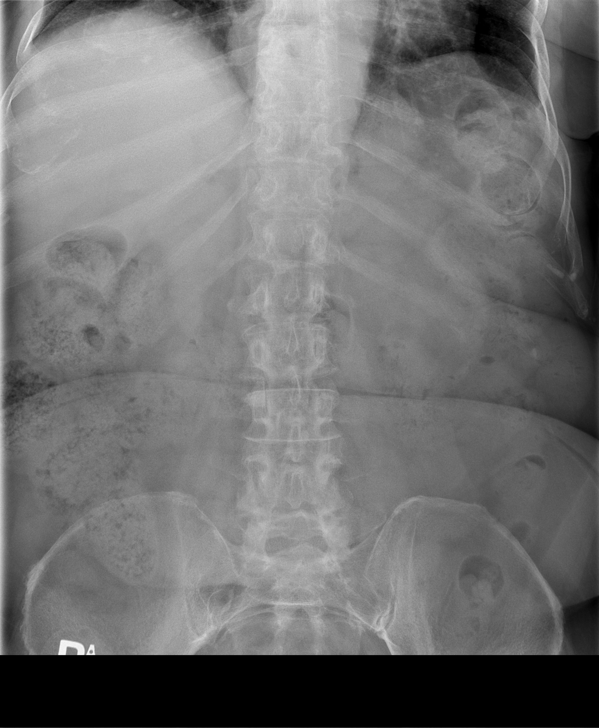

[Series 2: cp_standard · 0.52mm/px · 2 of 223 frames shown (1 of 6)]
[frame 34/223]
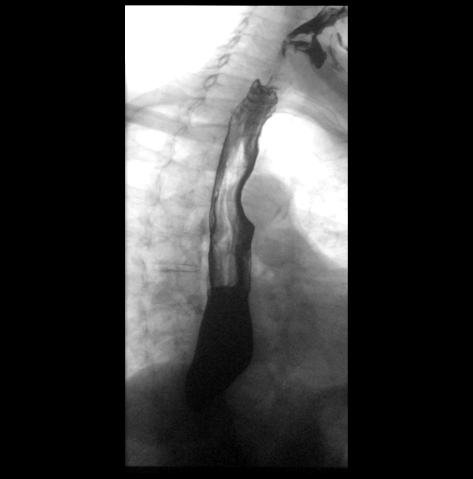
[frame 112/223]
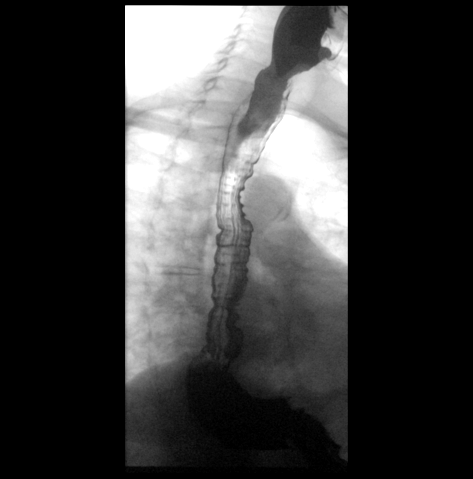

[Series 3: cp_standard · 0.52mm/px · 3 of 129 frames shown (2 of 6)]
[frame 4/129]
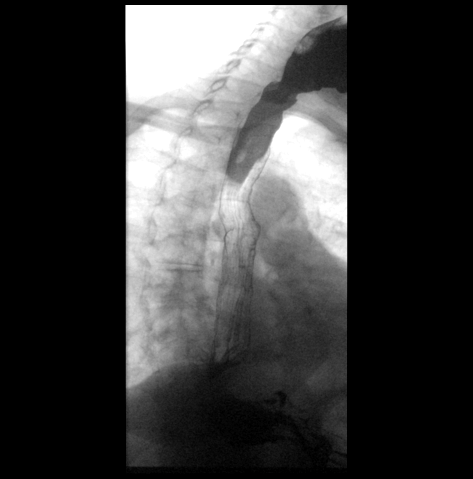
[frame 20/129]
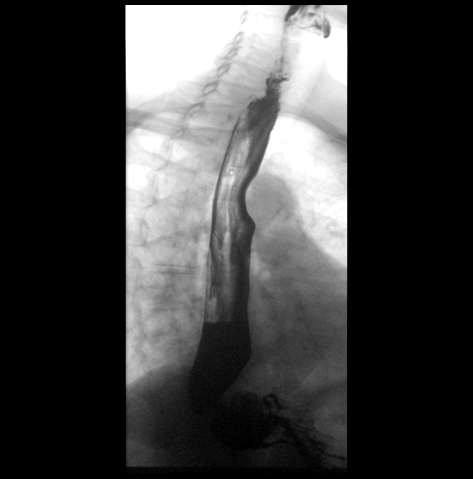
[frame 110/129]
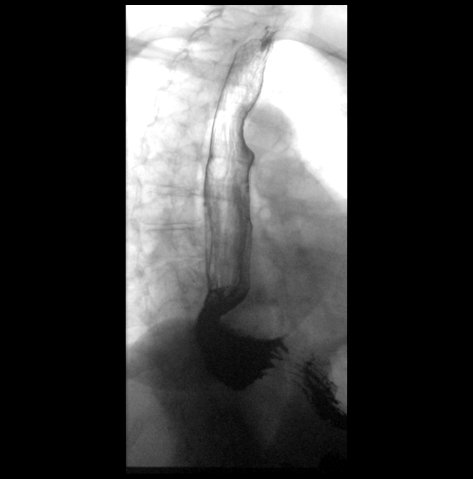

[Series 4: fluoro_barium singleshot_bw · 0.18mm/px · 1 of 1 slices shown (1 of 3)]
[im 1/1]
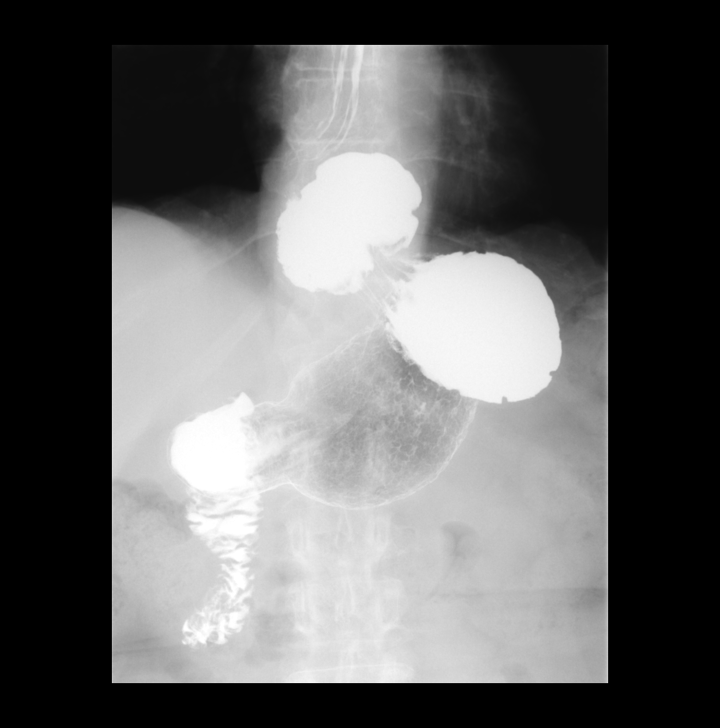

[Series 6: fluoro_barium singleshot_bw · 0.18mm/px · 1 of 1 slices shown (2 of 3)]
[im 1/1]
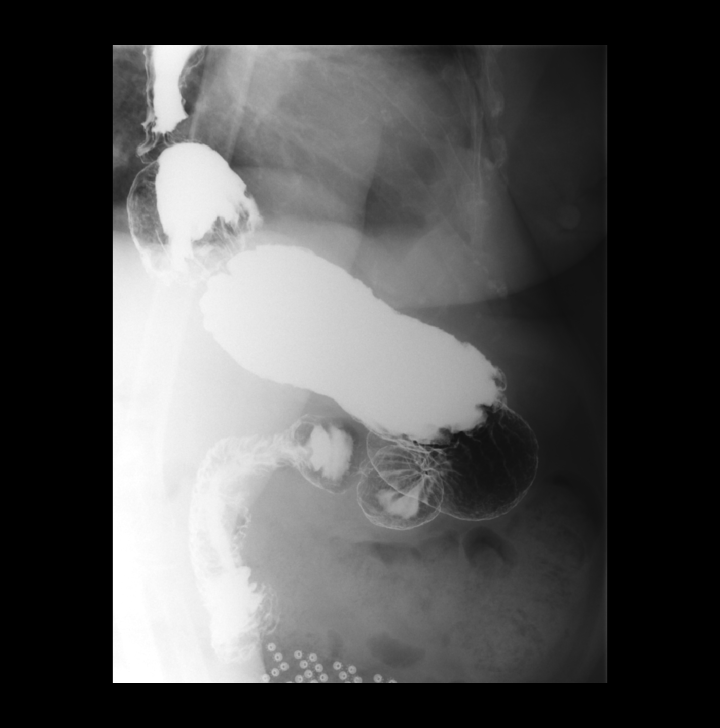

[Series 7: cp_standard · 0.28mm/px · 1 of 1 slices shown (3 of 6)]
[im 1/1]
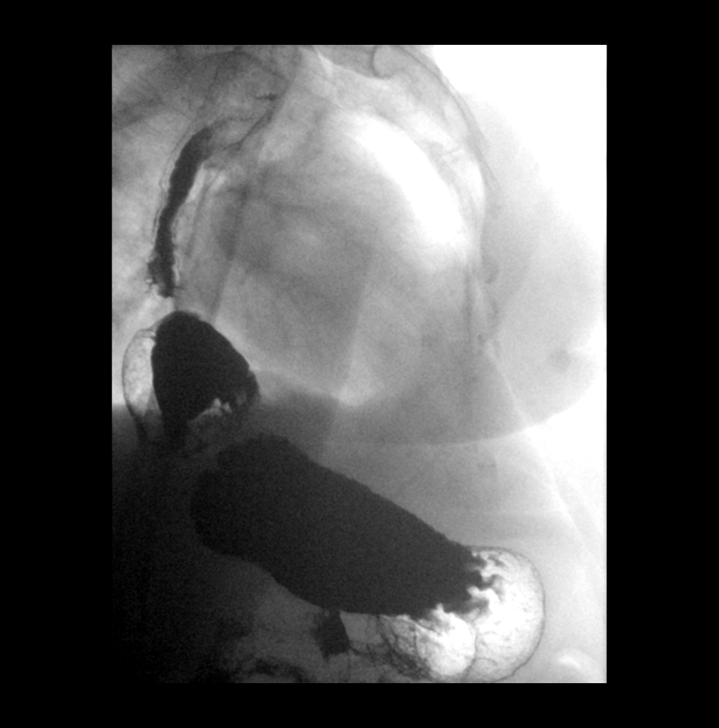

[Series 8: cp_standard · 0.28mm/px · 1 of 1 slices shown (4 of 6)]
[im 1/1]
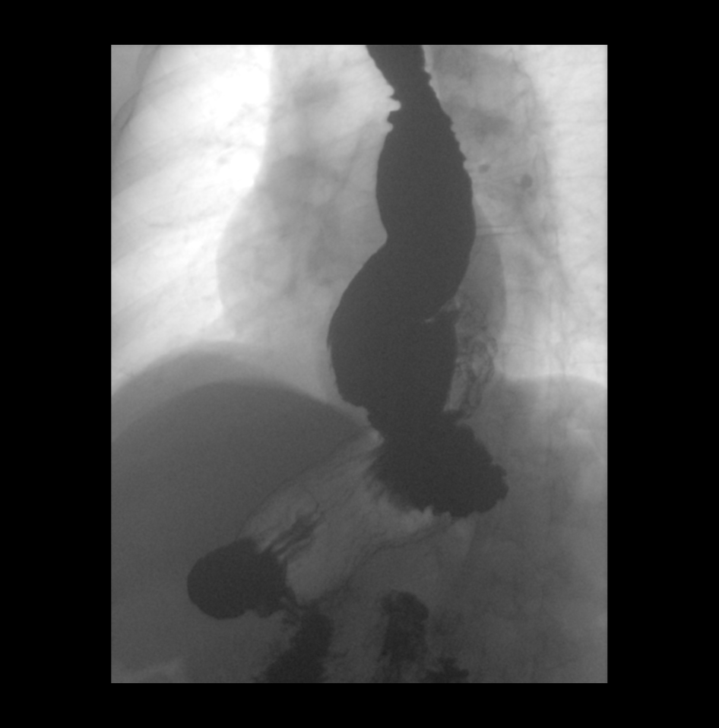

[Series 10: fluoro_barium singleshot_bw · 0.18mm/px · 1 of 1 slices shown (3 of 3)]
[im 1/1]
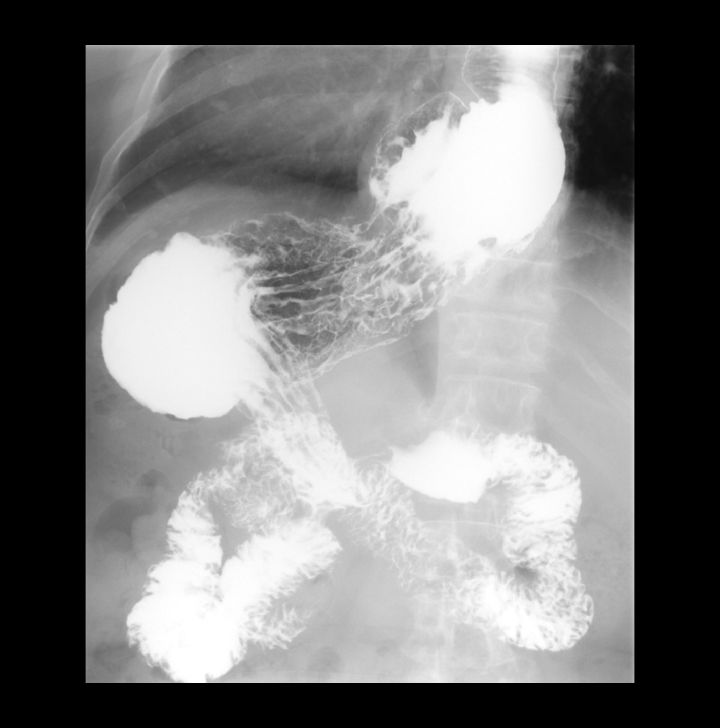

[Series 11: cp_standard · 0.28mm/px · 1 of 1 slices shown (5 of 6)]
[im 1/1]
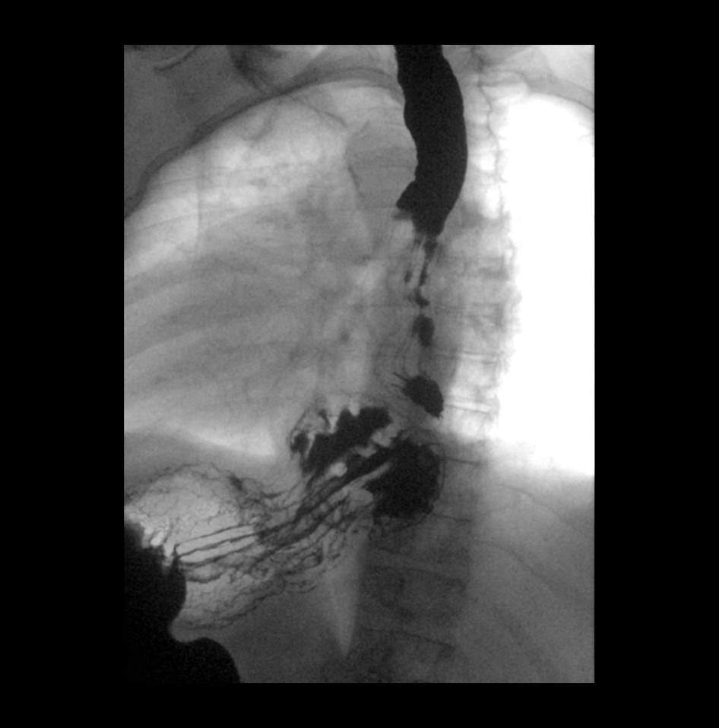

[Series 13: cp_standard · 0.55mm/px · 3 of 47 frames shown (6 of 6)]
[frame 8/47]
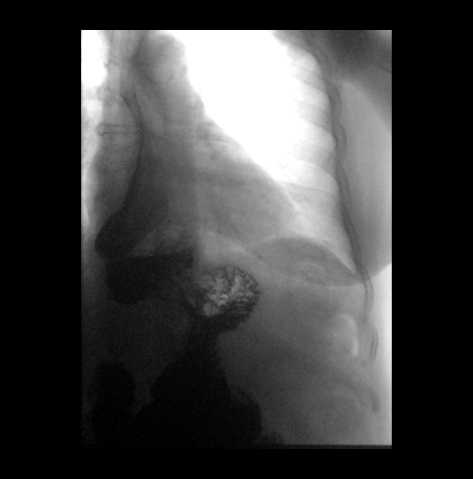
[frame 12/47]
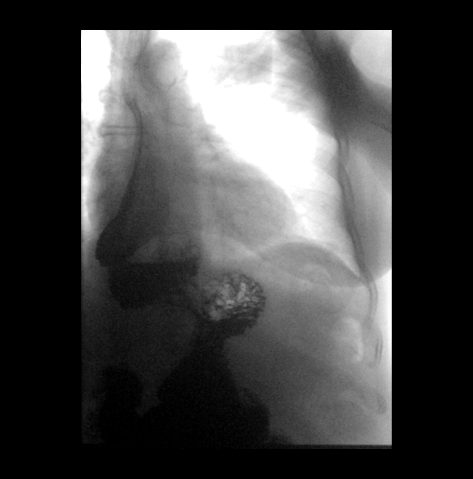
[frame 40/47]
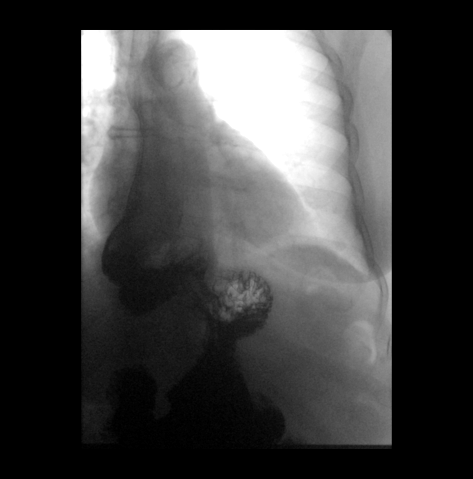

[15 of 22 positions shown; findings below may reference images not displayed]

FINDINGS: Scout radiograph of the abdomen demonstrates an unremarkable bowel
gas pattern.

Patient swallowed barium without difficulty. There is no esophageal
mass or stricture. Mild fold thickening, which may reflect
esophagitis.

Moderate hiatal hernia. There is severe gastroesophageal reflux to
the upper esophagus. Normal distensibility of the stomach. No
evidence of gastric ulcer or mass. Duodenum and visualized proximal
jejunum are unremarkable. A 13 mm barium tablet passed without
difficulty into the stomach.
IMPRESSION: Moderate hiatal hernia and severe gastroesophageal reflux.

No esophageal stricture. Mild fold thickening, which could reflect
esophagitis.

## 2020-07-17 NOTE — Patient Instructions (Signed)
Medication Instructions:  No medication changes. *If you need a refill on your cardiac medications before your next appointment, please call your pharmacy*   Lab Work: Your physician recommends that you have labs before your next visit. You need to have labs done when you are fasting.  You can come Monday through Friday 8:30 am to 12:00 pm and 1:15 to 4:30. You do not need to make an appointment as the order has already been placed. The labs you are going to have done are BMET, CBC, TSH, LFT and Lipids.  If you have labs (blood work) drawn today and your tests are completely normal, you will receive your results only by: Marland Kitchen MyChart Message (if you have MyChart) OR . A paper copy in the mail If you have any lab test that is abnormal or we need to change your treatment, we will call you to review the results.   Testing/Procedures: None ordered   Follow-Up: At [facility], you and your health needs are our priority.  As part of our continuing mission to provide you with exceptional heart care, we have created designated Provider Care Teams.  These Care Teams include your primary Cardiologist (physician) and Advanced Practice Providers (APPs -  Physician Assistants and Nurse Practitioners) who all work together to provide you with the care you need, when you need it.  We recommend signing up for the patient portal called "MyChart".  Sign up information is provided on this After Visit Summary.  MyChart is used to connect with patients for Virtual Visits (Telemedicine).  Patients are able to view lab/test results, encounter notes, upcoming appointments, etc.  Non-urgent messages can be sent to your provider as well.   To learn more about what you can do with MyChart, go to [email or web address].    Your next appointment:   6 month(s)  The format for your next appointment:   In Person  Provider:   Jyl Heinz, MD   Other Instructions NA

## 2020-07-17 NOTE — Progress Notes (Signed)
Cardiology Office Note:    Date:  07/17/2020   ID:  Deborah Hutchinson, DOB 1945-10-11, MRN [id number]  PCP:  [name], MD  Cardiologist:  [name], MD   Referring MD: [name], MD    ASSESSMENT:    1. Coronary artery disease involving native coronary artery of native heart without angina pectoris   2. Essential hypertension   3. Mixed hyperlipidemia   4. Diabetes mellitus due to underlying condition with unspecified complications ([place])   5. Moderate aortic stenosis    PLAN:    In order of problems listed above:  1. Coronary artery disease: Secondary prevention stressed with the patient.  Importance of compliance with diet medication stressed and she vocalized understanding.  She has moderate aortic stenosis and she understands this.  I told her to look out for symptoms of angina, dizziness or syncope and dyspnea on exertion and she understands.  She has none of these.  Coronary angiography report was discussed with her at this time. 2. Moderate aortic stenosis: Stable at this time.  Above-mentioned symptoms were discussed with her she and she understands and questions were answered to her satisfaction. 3. Essential hypertension: Blood pressure stable and diet was emphasized.  Salt intake and dietary issues and lifestyle modification was urged and she promises to do better. 4. Mixed dyslipidemia: Lipids reviewed and I discussed this with her.  They are fine.  I reviewed them from the [facility] sheet. 5. Patient will be seen in follow-up appointment in 6 months or earlier if the patient has any concerns.  Patient will have complete blood work before next visit.   Medication Adjustments/Labs and Tests Ordered: Current medicines are reviewed at length with the patient today.  Concerns regarding medicines are outlined above.  No orders of the defined types were placed in this encounter.  No orders of the defined types were placed in this encounter.    No chief complaint on  file.    History of Present Illness:    Deborah Hutchinson is a 75 y.o. female patient has past medical history of coronary artery disease, essential hypertension, mixed dyslipidemia, moderate diabetes mellitus and moderate aortic stenosis.  She denies any problems at this time and takes care of activities of daily living.  She complains of chest pain at times which is why she underwent coronary angiography and the details are mentioned below.  Patient is accompanied by her granddaughter.  At the time of my evaluation, the patient is alert awake oriented and in no distress.  Past Medical History:  Diagnosis Date  . Aftercare following surgery 03/26/2020  . Angina    normal stress test  . Angina pectoris ([place]) 06/17/2020  . Anxiety   . Arthritis   . Asthma   . Bronchitis   . CAD (coronary artery disease) 02/13/2019  . Chest pain 05/08/2019  . Chest pain of uncertain etiology   . Chronic bilateral low back pain without sciatica 07/11/2019   Formatting of this note might be different from the original. Added automatically from request for surgery [id number]  . Chronic obstructive pulmonary disease ([place]) 02/09/2017   Formatting of this note might be different from the original. Last Assessment & Plan:  I will review her PFTs after I obtain records from her pulmonologist. Based on this we will decide if she needs inhalers, subjectively they do not seem to have helped her at all. She may benefit from a pulmonary rehabilitation program Formatting of this note might be  different from the original. Last Assessment   . COPD (chronic obstructive pulmonary disease) ([place]) 02/09/2017  . 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 Diabetes mellitus   . Diabetes mellitus due to underlying condition with unspecified complications ([place]) 7/0/6237  . Dyslipidemia 06/10/2015  . Esophageal dysphagia 09/02/2019   Formatting of this note might be different  from the original. Added automatically from request for surgery [phone]  . Essential hypertension 06/10/2015  . Gastroparesis 09/04/2019  . GERD (gastroesophageal reflux disease)   . H/O hiatal hernia   . Headache 09/24/2018  . Heart murmur   . Hyperlipemia   . Hypertension   . Mixed dyslipidemia 06/10/2015  . Moderate aortic stenosis 05/16/2019  . Nausea and vomiting 06/16/2020  . Nonspecific (abnormal) findings on radiological and other examination of gastrointestinal tract 05/19/2011  . OSA (obstructive sleep apnea) 02/09/2017  . Pain and swelling of toe of right foot 07/15/2016  . Palpitations 06/02/2016  . 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 Added automatically from request for surgery [id number]  . Peritoneal mesothelioma ([place]) 01/16/2020  . S/P lumbar fusion 03/26/2020  . Shortness of breath    on  excertion  . Sleep apnea     Past Surgical History:  Procedure Laterality Date  . ABDOMINAL HYSTERECTOMY    . ABDOMINAL HYSTERECTOMY    . BREAST SURGERY     begin mass,left  breast  . COLONOSCOPY  08/17/2015   Colonic polyp status post polypectomy. Mild pancolonic diverticulosis. Highly redundant colon.   Marland Kitchen DILATION AND CURETTAGE OF UTERUS     one  . ESOPHAGOGASTRODUODENOSCOPY  08/17/2015   Moderate hiatal hernia. Otherwise noraml EGD.   Marland Kitchen ESOPHAGOGASTRODUODENOSCOPY  09/03/2019   [place]  . EUS  05/19/2011   Procedure: UPPER ENDOSCOPIC ULTRASOUND (EUS) LINEAR;  Surgeon: Owens Loffler, MD;  Location: [facility];  Service: Endoscopy;  Laterality: N/A;  radial linear   . HYSTEROTOMY    . KNEE SURGERY    . LEFT HEART CATH AND CORONARY ANGIOGRAPHY    . LEFT HEART CATH AND CORONARY ANGIOGRAPHY N/A 05/08/2019   Procedure: LEFT HEART CATH AND CORONARY ANGIOGRAPHY;  Surgeon: Burnell Blanks, MD;  Location: [facility];  Service: Cardiovascular;  Laterality: N/A;  . right thumb     tendon repaire  .  RIGHT/LEFT HEART CATH AND CORONARY ANGIOGRAPHY N/A 06/23/2020   Procedure: RIGHT/LEFT HEART CATH AND CORONARY ANGIOGRAPHY;  Surgeon: Martinique, Peter M, MD;  Location: [facility];  Service: Cardiovascular;  Laterality: N/A;  . SHOULDER SURGERY     rotator cuff repair  . TRANSFORAMINAL LUMBAR INTERBODY FUSION L4-5   02/27/2020  . WRIST SURGERY     rigth    Current Medications: Current Meds  Medication Sig  . albuterol (PROVENTIL HFA;VENTOLIN HFA) 108 (90 Base) MCG/ACT inhaler Inhale 2 puffs into the lungs every 6 (six) hours as needed for wheezing or shortness of breath.  . budesonide-formoterol (SYMBICORT) 160-4.5 MCG/ACT inhaler Inhale 2 puffs into the lungs 2 (two) times daily.  Marland Kitchen buPROPion (WELLBUTRIN XL) 300 MG 24 hr tablet Take 300 mg by mouth daily.   . canagliflozin (INVOKANA) 300 MG TABS tablet Take 300 mg by mouth daily.  . cetirizine (ZYRTEC) 10 MG tablet Take 10 mg by mouth daily.  . Cholecalciferol (VITAMIN D3) 1.25 MG (50000 UT) CAPS Take 50,000 Units by mouth every 30 (thirty) days.  Marland Kitchen  clopidogrel (PLAVIX) 75 MG tablet Take 1 tablet (75 mg total) by mouth daily.  . diclofenac Sodium (VOLTAREN) 1 % GEL Apply 4 g topically daily as needed (pain).  Marland Kitchen dicyclomine (BENTYL) 10 MG capsule Take 10 mg by mouth 2 (two) times daily as needed for spasms.  Marland Kitchen donepezil (ARICEPT) 10 MG tablet Take 10 mg by mouth daily at 12 noon.  . escitalopram (LEXAPRO) 20 MG tablet Take 20 mg by mouth daily.  . famotidine (PEPCID) 20 MG tablet Take 20 mg by mouth in the morning and at bedtime.  . fluticasone (FLONASE) 50 MCG/ACT nasal spray Place 2 sprays into both nostrils daily as needed for allergies.  . isosorbide mononitrate (IMDUR) 120 MG 24 hr tablet Take 1 tablet (120 mg total) by mouth daily.  Marland Kitchen linaclotide (LINZESS) 72 MCG capsule Take 72 mcg by mouth as needed (IBS).  Marland Kitchen liraglutide (VICTOZA) 18 MG/3ML SOPN Inject 1.8 mg into the skin daily.  Marland Kitchen loratadine-pseudoephedrine (CLARITIN-D  24-HOUR) 10-240 MG 24 hr tablet Take 1 tablet by mouth as needed for allergies.  Marland Kitchen losartan (COZAAR) 50 MG tablet Take 50 mg by mouth daily.  . montelukast (SINGULAIR) 10 MG tablet Take 10 mg by mouth at bedtime.   . Multiple Vitamin (MULTIVITAMIN) capsule Take 1 capsule by mouth daily.  . nitroGLYCERIN (NITROSTAT) 0.4 MG SL tablet DISSOLVE ONE TABLET UNDER THE TONGUE EVERY 5 MINUTES AS NEEDED FOR CHEST PAIN.  DO NOT EXCEED A TOTAL OF 3 DOSES IN 15 MINUTES  . pantoprazole (PROTONIX) 40 MG tablet Take 1 tablet (40 mg total) by mouth 2 (two) times daily.  . Potassium Chloride CR (MICRO-K) 8 MEQ CPCR capsule CR Take 8 mEq by mouth daily.  . rosuvastatin (CRESTOR) 20 MG tablet Take 1 tablet (20 mg total) by mouth daily.  Marland Kitchen tiotropium (SPIRIVA) 18 MCG inhalation capsule Place 18 mcg into inhaler and inhale daily as needed (Asthma).     Allergies:   Penicillins and Tramadol   Social History   Socioeconomic History  . Marital status: Single    Spouse name: Not on file  . Number of children: 3  . Years of education: Not on file  . Highest education level: Not on file  Occupational History  . Occupation: retired  Tobacco Use  . Smoking status: Former Smoker    Packs/day: 1.00    Quit date: 05/17/2011    Years since quitting: 9.1  . Smokeless tobacco: Never Used  Vaping Use  . Vaping Use: Never used  Substance and Sexual Activity  . Alcohol use: No  . Drug use: No  . Sexual activity: Never  Other Topics Concern  . Not on file  Social History Narrative   Right handed   Social Determinants of Health   Financial Resource Strain: Not on file  Food Insecurity: Not on file  Transportation Needs: Not on file  Physical Activity: Not on file  Stress: Not on file  Social Connections: Not on file     Family History: The patient's family history includes Diabetes in her sister; Hypertension in her daughter. There is no history of Colon cancer.  ROS:   Please see the history of present  illness.    All other systems reviewed and are negative.  EKGs/Labs/Other Studies Reviewed:    The following studies were reviewed today: RIGHT/LEFT HEART CATH AND CORONARY ANGIOGRAPHY    Conclusion    Prox LAD lesion is 40% stenosed.  Prox RCA lesion is 50% stenosed.  The  left ventricular systolic function is normal.  LV end diastolic pressure is normal.  The left ventricular ejection fraction is 55-65% by visual estimate.  There is mild aortic valve stenosis.   1. Nonobstructive CAD. Unchanged from prior study. 2. Mild to moderate Aortic stenosis. Mean gradient 16 mm Hg. AVA 1.67 cm squared with index 0.95. 3. Normal LV filling pressures. 4. Normal right heart pressures 5. Normal cardiac output  Plan: recommend continued medical therapy. I do not see a cardiac cause for her chest pain.    Recent Labs: 02/03/2020: ALT 18 06/17/2020: BUN 16; Creatinine, Ser 1.12; Platelets 329 06/23/2020: Hemoglobin 9.9; Potassium 4.0; Sodium 144  Recent Lipid Panel    Component Value Date/Time   CHOL 159 02/03/2020 0937   TRIG 65 02/03/2020 0937   HDL 82 02/03/2020 0937   CHOLHDL 1.9 02/03/2020 0937   LDLCALC 64 02/03/2020 0937    Physical Exam:    VS:  BP (!) 156/76   Pulse 76   Ht 5\' 4"  (1.626 m)   Wt 166 lb 6.4 oz (75.5 kg)   SpO2 97%   BMI 28.56 kg/m     Wt Readings from Last 3 Encounters:  07/17/20 166 lb 6.4 oz (75.5 kg)  06/23/20 157 lb (71.2 kg)  06/17/20 161 lb (73 kg)     GEN: Patient is in no acute distress HEENT: Normal NECK: No JVD; No carotid bruits LYMPHATICS: No lymphadenopathy CARDIAC: Hear sounds regular, 2/6 systolic murmur at the apex. RESPIRATORY:  Clear to auscultation without rales, wheezing or rhonchi  ABDOMEN: Soft, non-tender, non-distended MUSCULOSKELETAL:  No edema; No deformity  SKIN: Warm and dry NEUROLOGIC:  Alert and oriented x 3 PSYCHIATRIC:  Normal affect   Signed, [name], MD  07/17/2020 2:28 PM    [facility]

## 2020-07-22 DIAGNOSIS — Z08 Encounter for follow-up examination after completed treatment for malignant neoplasm: Secondary | ICD-10-CM | POA: Diagnosis not present

## 2020-07-22 DIAGNOSIS — Z8589 Personal history of malignant neoplasm of other organs and systems: Secondary | ICD-10-CM | POA: Diagnosis not present

## 2020-07-22 DIAGNOSIS — N2889 Other specified disorders of kidney and ureter: Secondary | ICD-10-CM | POA: Diagnosis not present

## 2020-07-22 DIAGNOSIS — C451 Mesothelioma of peritoneum: Secondary | ICD-10-CM | POA: Diagnosis not present

## 2020-07-22 DIAGNOSIS — Z87891 Personal history of nicotine dependence: Secondary | ICD-10-CM | POA: Diagnosis not present

## 2020-07-22 DIAGNOSIS — R911 Solitary pulmonary nodule: Secondary | ICD-10-CM | POA: Diagnosis not present

## 2020-07-23 DIAGNOSIS — M4316 Spondylolisthesis, lumbar region: Secondary | ICD-10-CM | POA: Diagnosis not present

## 2020-07-23 DIAGNOSIS — M5459 Other low back pain: Secondary | ICD-10-CM | POA: Diagnosis not present

## 2020-07-29 DIAGNOSIS — M150 Primary generalized (osteo)arthritis: Secondary | ICD-10-CM | POA: Diagnosis not present

## 2020-07-29 DIAGNOSIS — G4700 Insomnia, unspecified: Secondary | ICD-10-CM | POA: Diagnosis not present

## 2020-07-29 DIAGNOSIS — E1142 Type 2 diabetes mellitus with diabetic polyneuropathy: Secondary | ICD-10-CM | POA: Diagnosis not present

## 2020-07-29 DIAGNOSIS — K2100 Gastro-esophageal reflux disease with esophagitis, without bleeding: Secondary | ICD-10-CM | POA: Diagnosis not present

## 2020-07-29 DIAGNOSIS — E785 Hyperlipidemia, unspecified: Secondary | ICD-10-CM | POA: Diagnosis not present

## 2020-07-29 DIAGNOSIS — U071 COVID-19: Secondary | ICD-10-CM | POA: Diagnosis not present

## 2020-07-29 DIAGNOSIS — L240 Irritant contact dermatitis due to detergents: Secondary | ICD-10-CM | POA: Diagnosis not present

## 2020-07-29 DIAGNOSIS — I10 Essential (primary) hypertension: Secondary | ICD-10-CM | POA: Diagnosis not present

## 2020-07-29 DIAGNOSIS — I2510 Atherosclerotic heart disease of native coronary artery without angina pectoris: Secondary | ICD-10-CM | POA: Diagnosis not present

## 2020-08-01 DIAGNOSIS — E1142 Type 2 diabetes mellitus with diabetic polyneuropathy: Secondary | ICD-10-CM | POA: Diagnosis not present

## 2020-08-01 DIAGNOSIS — G4700 Insomnia, unspecified: Secondary | ICD-10-CM | POA: Diagnosis not present

## 2020-08-01 DIAGNOSIS — I2510 Atherosclerotic heart disease of native coronary artery without angina pectoris: Secondary | ICD-10-CM | POA: Diagnosis not present

## 2020-08-01 DIAGNOSIS — K2100 Gastro-esophageal reflux disease with esophagitis, without bleeding: Secondary | ICD-10-CM | POA: Diagnosis not present

## 2020-08-01 DIAGNOSIS — M150 Primary generalized (osteo)arthritis: Secondary | ICD-10-CM | POA: Diagnosis not present

## 2020-08-01 DIAGNOSIS — E785 Hyperlipidemia, unspecified: Secondary | ICD-10-CM | POA: Diagnosis not present

## 2020-08-01 DIAGNOSIS — I10 Essential (primary) hypertension: Secondary | ICD-10-CM | POA: Diagnosis not present

## 2020-08-01 DIAGNOSIS — L240 Irritant contact dermatitis due to detergents: Secondary | ICD-10-CM | POA: Diagnosis not present

## 2020-08-01 DIAGNOSIS — Z7951 Long term (current) use of inhaled steroids: Secondary | ICD-10-CM | POA: Diagnosis not present

## 2020-08-03 DIAGNOSIS — S83242A Other tear of medial meniscus, current injury, left knee, initial encounter: Secondary | ICD-10-CM | POA: Diagnosis not present

## 2020-08-03 DIAGNOSIS — G8929 Other chronic pain: Secondary | ICD-10-CM | POA: Diagnosis not present

## 2020-08-03 DIAGNOSIS — M1712 Unilateral primary osteoarthritis, left knee: Secondary | ICD-10-CM | POA: Diagnosis not present

## 2020-08-03 DIAGNOSIS — M25562 Pain in left knee: Secondary | ICD-10-CM | POA: Diagnosis not present

## 2020-08-06 DIAGNOSIS — I10 Essential (primary) hypertension: Secondary | ICD-10-CM | POA: Diagnosis not present

## 2020-08-06 DIAGNOSIS — M150 Primary generalized (osteo)arthritis: Secondary | ICD-10-CM | POA: Diagnosis not present

## 2020-08-06 DIAGNOSIS — I2510 Atherosclerotic heart disease of native coronary artery without angina pectoris: Secondary | ICD-10-CM | POA: Diagnosis not present

## 2020-08-06 DIAGNOSIS — G4700 Insomnia, unspecified: Secondary | ICD-10-CM | POA: Diagnosis not present

## 2020-08-06 DIAGNOSIS — Z7951 Long term (current) use of inhaled steroids: Secondary | ICD-10-CM | POA: Diagnosis not present

## 2020-08-06 DIAGNOSIS — E1142 Type 2 diabetes mellitus with diabetic polyneuropathy: Secondary | ICD-10-CM | POA: Diagnosis not present

## 2020-08-06 DIAGNOSIS — E785 Hyperlipidemia, unspecified: Secondary | ICD-10-CM | POA: Diagnosis not present

## 2020-08-06 DIAGNOSIS — L240 Irritant contact dermatitis due to detergents: Secondary | ICD-10-CM | POA: Diagnosis not present

## 2020-08-06 DIAGNOSIS — K2100 Gastro-esophageal reflux disease with esophagitis, without bleeding: Secondary | ICD-10-CM | POA: Diagnosis not present

## 2020-08-07 DIAGNOSIS — M1990 Unspecified osteoarthritis, unspecified site: Secondary | ICD-10-CM | POA: Diagnosis not present

## 2020-08-07 DIAGNOSIS — G5793 Unspecified mononeuropathy of bilateral lower limbs: Secondary | ICD-10-CM | POA: Diagnosis not present

## 2020-08-07 DIAGNOSIS — M5440 Lumbago with sciatica, unspecified side: Secondary | ICD-10-CM | POA: Diagnosis not present

## 2020-08-07 DIAGNOSIS — I2510 Atherosclerotic heart disease of native coronary artery without angina pectoris: Secondary | ICD-10-CM | POA: Diagnosis not present

## 2020-08-10 DIAGNOSIS — M150 Primary generalized (osteo)arthritis: Secondary | ICD-10-CM | POA: Diagnosis not present

## 2020-08-10 DIAGNOSIS — Z7951 Long term (current) use of inhaled steroids: Secondary | ICD-10-CM | POA: Diagnosis not present

## 2020-08-10 DIAGNOSIS — L240 Irritant contact dermatitis due to detergents: Secondary | ICD-10-CM | POA: Diagnosis not present

## 2020-08-10 DIAGNOSIS — I10 Essential (primary) hypertension: Secondary | ICD-10-CM | POA: Diagnosis not present

## 2020-08-10 DIAGNOSIS — K2100 Gastro-esophageal reflux disease with esophagitis, without bleeding: Secondary | ICD-10-CM | POA: Diagnosis not present

## 2020-08-10 DIAGNOSIS — I2510 Atherosclerotic heart disease of native coronary artery without angina pectoris: Secondary | ICD-10-CM | POA: Diagnosis not present

## 2020-08-10 DIAGNOSIS — G4700 Insomnia, unspecified: Secondary | ICD-10-CM | POA: Diagnosis not present

## 2020-08-10 DIAGNOSIS — E1142 Type 2 diabetes mellitus with diabetic polyneuropathy: Secondary | ICD-10-CM | POA: Diagnosis not present

## 2020-08-10 DIAGNOSIS — E785 Hyperlipidemia, unspecified: Secondary | ICD-10-CM | POA: Diagnosis not present

## 2020-08-12 DIAGNOSIS — J449 Chronic obstructive pulmonary disease, unspecified: Secondary | ICD-10-CM | POA: Diagnosis not present

## 2020-08-12 DIAGNOSIS — E785 Hyperlipidemia, unspecified: Secondary | ICD-10-CM | POA: Diagnosis not present

## 2020-08-12 DIAGNOSIS — M1990 Unspecified osteoarthritis, unspecified site: Secondary | ICD-10-CM | POA: Diagnosis not present

## 2020-08-12 DIAGNOSIS — M159 Polyosteoarthritis, unspecified: Secondary | ICD-10-CM | POA: Diagnosis not present

## 2020-08-12 DIAGNOSIS — E1140 Type 2 diabetes mellitus with diabetic neuropathy, unspecified: Secondary | ICD-10-CM | POA: Diagnosis not present

## 2020-08-12 DIAGNOSIS — E663 Overweight: Secondary | ICD-10-CM | POA: Diagnosis not present

## 2020-08-12 DIAGNOSIS — Z6827 Body mass index (BMI) 27.0-27.9, adult: Secondary | ICD-10-CM | POA: Diagnosis not present

## 2020-08-12 DIAGNOSIS — E559 Vitamin D deficiency, unspecified: Secondary | ICD-10-CM | POA: Diagnosis not present

## 2020-08-12 DIAGNOSIS — Z79899 Other long term (current) drug therapy: Secondary | ICD-10-CM | POA: Diagnosis not present

## 2020-08-12 DIAGNOSIS — I10 Essential (primary) hypertension: Secondary | ICD-10-CM | POA: Diagnosis not present

## 2020-08-13 DIAGNOSIS — Z79899 Other long term (current) drug therapy: Secondary | ICD-10-CM | POA: Diagnosis not present

## 2020-08-13 DIAGNOSIS — E559 Vitamin D deficiency, unspecified: Secondary | ICD-10-CM | POA: Diagnosis not present

## 2020-08-13 DIAGNOSIS — E1140 Type 2 diabetes mellitus with diabetic neuropathy, unspecified: Secondary | ICD-10-CM | POA: Diagnosis not present

## 2020-08-13 DIAGNOSIS — E785 Hyperlipidemia, unspecified: Secondary | ICD-10-CM | POA: Diagnosis not present

## 2020-08-17 DIAGNOSIS — I10 Essential (primary) hypertension: Secondary | ICD-10-CM | POA: Diagnosis not present

## 2020-08-17 DIAGNOSIS — M150 Primary generalized (osteo)arthritis: Secondary | ICD-10-CM | POA: Diagnosis not present

## 2020-08-17 DIAGNOSIS — G4700 Insomnia, unspecified: Secondary | ICD-10-CM | POA: Diagnosis not present

## 2020-08-17 DIAGNOSIS — E785 Hyperlipidemia, unspecified: Secondary | ICD-10-CM | POA: Diagnosis not present

## 2020-08-17 DIAGNOSIS — E1142 Type 2 diabetes mellitus with diabetic polyneuropathy: Secondary | ICD-10-CM | POA: Diagnosis not present

## 2020-08-17 DIAGNOSIS — Z7951 Long term (current) use of inhaled steroids: Secondary | ICD-10-CM | POA: Diagnosis not present

## 2020-08-17 DIAGNOSIS — E1140 Type 2 diabetes mellitus with diabetic neuropathy, unspecified: Secondary | ICD-10-CM | POA: Diagnosis not present

## 2020-08-17 DIAGNOSIS — I2510 Atherosclerotic heart disease of native coronary artery without angina pectoris: Secondary | ICD-10-CM | POA: Diagnosis not present

## 2020-08-17 DIAGNOSIS — K2100 Gastro-esophageal reflux disease with esophagitis, without bleeding: Secondary | ICD-10-CM | POA: Diagnosis not present

## 2020-08-17 DIAGNOSIS — L240 Irritant contact dermatitis due to detergents: Secondary | ICD-10-CM | POA: Diagnosis not present

## 2020-08-23 DIAGNOSIS — M5459 Other low back pain: Secondary | ICD-10-CM | POA: Diagnosis not present

## 2020-08-23 DIAGNOSIS — M4316 Spondylolisthesis, lumbar region: Secondary | ICD-10-CM | POA: Diagnosis not present

## 2020-08-24 ENCOUNTER — Other Ambulatory Visit: Payer: Self-pay | Admitting: Cardiology

## 2020-08-26 NOTE — Telephone Encounter (Signed)
Refill sent to pharmacy.   

## 2020-08-26 NOTE — Telephone Encounter (Signed)
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 Geraldo Pitter

## 2020-08-27 ENCOUNTER — Telehealth: Payer: Self-pay | Admitting: Gastroenterology

## 2020-08-27 NOTE — Telephone Encounter (Signed)
Spoke with pt and she is aware.  PS- She said Thanks a lot!

## 2020-08-27 NOTE — Telephone Encounter (Signed)
Pt states her stools have been dark green for over a week. Reports she has not taken any new meds other than the hemorrhoid cream. Pt also reports she has not eaten any green foods either. Pt wants to know if the hem cream may be making her stools the dark green. Please advise.

## 2020-08-27 NOTE — Telephone Encounter (Signed)
Deborah Hutchinson, Nothing to be worried about. It is likely due to medications.  Not the hemorrhoidal cream Is she having diarrhea? RG  PS- tell her that Dr. Lyndel Safe said it is her meanness coming out :) I know her for last 20 years !!!

## 2020-08-27 NOTE — Telephone Encounter (Signed)
Pt states that her bms have been dark green for about a week. She does not have any sxs or abd pain but was advised to call us.

## 2020-08-28 DIAGNOSIS — L240 Irritant contact dermatitis due to detergents: Secondary | ICD-10-CM | POA: Diagnosis not present

## 2020-08-28 DIAGNOSIS — K2100 Gastro-esophageal reflux disease with esophagitis, without bleeding: Secondary | ICD-10-CM | POA: Diagnosis not present

## 2020-08-28 DIAGNOSIS — E1142 Type 2 diabetes mellitus with diabetic polyneuropathy: Secondary | ICD-10-CM | POA: Diagnosis not present

## 2020-08-28 DIAGNOSIS — Z7951 Long term (current) use of inhaled steroids: Secondary | ICD-10-CM | POA: Diagnosis not present

## 2020-08-28 DIAGNOSIS — E785 Hyperlipidemia, unspecified: Secondary | ICD-10-CM | POA: Diagnosis not present

## 2020-08-28 DIAGNOSIS — G4700 Insomnia, unspecified: Secondary | ICD-10-CM | POA: Diagnosis not present

## 2020-08-28 DIAGNOSIS — I10 Essential (primary) hypertension: Secondary | ICD-10-CM | POA: Diagnosis not present

## 2020-08-28 DIAGNOSIS — M150 Primary generalized (osteo)arthritis: Secondary | ICD-10-CM | POA: Diagnosis not present

## 2020-08-28 DIAGNOSIS — I2510 Atherosclerotic heart disease of native coronary artery without angina pectoris: Secondary | ICD-10-CM | POA: Diagnosis not present

## 2020-09-02 ENCOUNTER — Ambulatory Visit: Payer: Medicare HMO | Admitting: Neurology

## 2020-09-06 DIAGNOSIS — M5440 Lumbago with sciatica, unspecified side: Secondary | ICD-10-CM | POA: Diagnosis not present

## 2020-09-06 DIAGNOSIS — M1990 Unspecified osteoarthritis, unspecified site: Secondary | ICD-10-CM | POA: Diagnosis not present

## 2020-09-06 DIAGNOSIS — G5793 Unspecified mononeuropathy of bilateral lower limbs: Secondary | ICD-10-CM | POA: Diagnosis not present

## 2020-09-06 DIAGNOSIS — I2510 Atherosclerotic heart disease of native coronary artery without angina pectoris: Secondary | ICD-10-CM | POA: Diagnosis not present

## 2020-09-15 ENCOUNTER — Telehealth: Payer: Self-pay | Admitting: Cardiology

## 2020-09-15 ENCOUNTER — Other Ambulatory Visit: Payer: Self-pay

## 2020-09-15 NOTE — Telephone Encounter (Signed)
Pt c/o of Chest Pain: STAT if CP now or developed within 24 hours  1. Are you having CP right now? no  2. Are you experiencing any other symptoms (ex. SOB, nausea, vomiting, sweating)? nausea  3. How long have you been experiencing CP? Soreness in chest  4. Is your CP continuous or coming and going? Coming and going   5. Have you taken Nitroglycerin? no?  Patient states when she wakes up in the morning is when her chest hurt. Made patient appt tomorrow at 2pm

## 2020-09-15 NOTE — Telephone Encounter (Signed)
Spoke with pt who states that she has been having intermittent episodes of chest pain that radiates into her back. She describes the pain as aching. Pt states that she has nausea but no vomiting or shortness of breath. Pt has not used nitroglycerin but has now been advised to if the pain reccurs. Pt advised to call 911 or go to the ED for return of pain not relieved by nitroglycerin. Pt verbalized understanding and had no additional questions.

## 2020-09-16 ENCOUNTER — Encounter: Payer: Self-pay | Admitting: Cardiology

## 2020-09-16 ENCOUNTER — Ambulatory Visit: Payer: Medicare HMO | Admitting: Cardiology

## 2020-09-16 ENCOUNTER — Other Ambulatory Visit: Payer: Self-pay

## 2020-09-16 ENCOUNTER — Ambulatory Visit (INDEPENDENT_AMBULATORY_CARE_PROVIDER_SITE_OTHER): Payer: Medicare HMO | Admitting: Cardiology

## 2020-09-16 VITALS — BP 136/80 | HR 82 | Ht 64.0 in | Wt 170.6 lb

## 2020-09-16 DIAGNOSIS — I10 Essential (primary) hypertension: Secondary | ICD-10-CM | POA: Diagnosis not present

## 2020-09-16 DIAGNOSIS — I2510 Atherosclerotic heart disease of native coronary artery without angina pectoris: Secondary | ICD-10-CM | POA: Diagnosis not present

## 2020-09-16 DIAGNOSIS — E088 Diabetes mellitus due to underlying condition with unspecified complications: Secondary | ICD-10-CM

## 2020-09-16 DIAGNOSIS — E782 Mixed hyperlipidemia: Secondary | ICD-10-CM

## 2020-09-16 NOTE — Progress Notes (Signed)
Cardiology Office Note:    Date:  09/16/2020   ID:  Deborah Hutchinson, DOB [birth year], MRN [id number]  PCP:  [name], MD  Cardiologist:  [name], MD   Referring MD: [name], MD    ASSESSMENT:    1. Coronary artery disease involving native coronary artery of native heart without angina pectoris   2. Essential hypertension   3. Mixed hyperlipidemia   4. Diabetes mellitus due to underlying condition with unspecified complications ([place])    PLAN:    In order of problems listed above:  1. Coronary artery disease: Secondary prevention stressed with the patient.  Importance of compliance with diet medication stressed and she vocalized understanding.  Coronary angiography report as discussed below.  I reassured her about her chest pain and she will see her primary care physician for noncardiac chest pain issues. 2. Mild to moderate aortic stenosis: Asymptomatic.  I educated her about this.  Medical management at this time. 3. Essential hypertension: Blood pressure stable and lifestyle modification urged 4. Mixed dyslipidemia: Stable at this time.  Lipids reviewed.  Diet was also emphasized with diabetes mellitus. 5. Patient will be seen in follow-up appointment in 6 months or earlier if the patient has any concerns    Medication Adjustments/Labs and Tests Ordered: Current medicines are reviewed at length with the patient today.  Concerns regarding medicines are outlined above.  No orders of the defined types were placed in this encounter.  No orders of the defined types were placed in this encounter.    No chief complaint on file.    History of Present Illness:    Deborah Hutchinson is a 75 y.o. female.  Patient has history of nonobstructive coronary artery disease, mild to moderate aortic stenosis, essential hypertension dyslipidemia and diabetes mellitus.  She denies any problems at this time and takes care of activities of daily living.  No chest pain  orthopnea or PND.  She occasionally has chest pain.  She has also had issues with arthritis.  At the time of my evaluation, the patient is alert awake oriented and in no distress.  Coronary angiography report is discussed in detail below.  Past Medical History:  Diagnosis Date  . Aftercare following surgery 03/26/2020  . Angina pectoris ([place]) 06/17/2020  . Anxiety   . Arthritis   . Asthma   . Bronchitis   . CAD (coronary artery disease) 02/13/2019  . Chest pain 05/08/2019  . Chest pain of uncertain etiology   . Chronic bilateral low back pain without sciatica 07/11/2019   Formatting of this note might be different from the original. Added automatically from request for surgery [id number]  . Chronic obstructive pulmonary disease ([place]) 02/09/2017   Formatting of this note might be different from the original. Last Assessment & Plan:  I will review her PFTs after I obtain records from her pulmonologist. Based on this we will decide if she needs inhalers, subjectively they do not seem to have helped her at all. She may benefit from a pulmonary rehabilitation program Formatting of this note might be different from the original. Last Assessment   . COPD (chronic obstructive pulmonary disease) ([place]) 02/09/2017  . 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 Diabetes mellitus   . Diabetes mellitus due to underlying condition with unspecified complications ([place]) 09/10/7320  . Dyslipidemia 06/10/2015  . Esophageal dysphagia 09/02/2019   Formatting of this note might be different from the original.  Added automatically from request for surgery [id number]  . Essential hypertension 06/10/2015  . Gastroparesis 09/04/2019  . GERD (gastroesophageal reflux disease)   . H/O hiatal hernia   . Headache 09/24/2018  . Heart murmur   . Hyperlipemia   . Hypertension   . Mixed dyslipidemia 06/10/2015  . Moderate aortic stenosis 05/16/2019  . Nausea and vomiting 06/16/2020   . Nonspecific (abnormal) findings on radiological and other examination of gastrointestinal tract 05/19/2011  . OSA (obstructive sleep apnea) 02/09/2017  . Pain and swelling of toe of right foot 07/15/2016  . Palpitations 06/02/2016  . 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 Added automatically from request for surgery [id number]  . 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 S/P lumbar fusion 03/26/2020  . Shortness of breath    on  excertion  . Sleep apnea     Past Surgical History:  Procedure Laterality Date  . ABDOMINAL HYSTERECTOMY    . ABDOMINAL HYSTERECTOMY    . BREAST SURGERY     begin mass,left  breast  . COLONOSCOPY  08/17/2015   Colonic polyp status post polypectomy. Mild pancolonic diverticulosis. Highly redundant colon.   Marland Kitchen DILATION AND CURETTAGE OF UTERUS     one  . ESOPHAGOGASTRODUODENOSCOPY  08/17/2015   Moderate hiatal hernia. Otherwise noraml EGD.   Marland Kitchen ESOPHAGOGASTRODUODENOSCOPY  09/03/2019   [place]  . EUS  05/19/2011   Procedure: UPPER ENDOSCOPIC ULTRASOUND (EUS) LINEAR;  Surgeon: Owens Loffler, MD;  Location: [facility];  Service: Endoscopy;  Laterality: N/A;  radial linear   . HYSTEROTOMY    . KNEE SURGERY    . LEFT HEART CATH AND CORONARY ANGIOGRAPHY    . LEFT HEART CATH AND CORONARY ANGIOGRAPHY N/A 05/08/2019   Procedure: LEFT HEART CATH AND CORONARY ANGIOGRAPHY;  Surgeon: Burnell Blanks, MD;  Location: [facility];  Service: Cardiovascular;  Laterality: N/A;  . right thumb     tendon repaire  . RIGHT/LEFT HEART CATH AND CORONARY ANGIOGRAPHY N/A 06/23/2020   Procedure: RIGHT/LEFT HEART CATH AND CORONARY ANGIOGRAPHY;  Surgeon: Martinique, Peter M, MD;  Location: [facility];  Service: Cardiovascular;  Laterality: N/A;  . SHOULDER SURGERY     rotator cuff repair  . TRANSFORAMINAL LUMBAR INTERBODY FUSION L4-5   02/27/2020  . WRIST SURGERY     rigth    Current  Medications: Current Meds  Medication Sig  . albuterol (PROVENTIL HFA;VENTOLIN HFA) 108 (90 Base) MCG/ACT inhaler Inhale 2 puffs into the lungs every 6 (six) hours as needed for wheezing or shortness of breath.  Marland Kitchen amLODipine (NORVASC) 10 MG tablet Take 10 mg by mouth as needed (hypertension).  . baclofen (LIORESAL) 10 MG tablet Take 10 mg by mouth 2 (two) times daily.  . budesonide-formoterol (SYMBICORT) 160-4.5 MCG/ACT inhaler Inhale 2 puffs into the lungs 2 (two) times daily.  Marland Kitchen buPROPion (WELLBUTRIN XL) 300 MG 24 hr tablet Take 300 mg by mouth daily.   . canagliflozin (INVOKANA) 300 MG TABS tablet Take 300 mg by mouth daily.  . cetirizine (ZYRTEC) 10 MG tablet Take 10 mg by mouth daily.  . Cholecalciferol (VITAMIN D3) 1.25 MG (50000 UT) CAPS Take 50,000 Units by mouth every 30 (thirty) days.  . clopidogrel (PLAVIX) 75 MG tablet Take 1 tablet (75 mg total) by mouth daily.  . diclofenac Sodium (VOLTAREN) 1 % GEL Apply 4 g topically daily as needed (pain).  Marland Kitchen dicyclomine (BENTYL) 10 MG capsule Take  10 mg by mouth 2 (two) times daily as needed for spasms.  Marland Kitchen donepezil (ARICEPT) 10 MG tablet Take 10 mg by mouth daily at 12 noon.  . escitalopram (LEXAPRO) 20 MG tablet Take 20 mg by mouth daily.  . famotidine (PEPCID) 20 MG tablet Take 20 mg by mouth in the morning and at bedtime.  . fluticasone (FLONASE) 50 MCG/ACT nasal spray Place 2 sprays into both nostrils daily as needed for allergies.  . hydrALAZINE (APRESOLINE) 50 MG tablet Take 50 mg by mouth as needed (hypertension).  . hydrochlorothiazide (HYDRODIURIL) 25 MG tablet Take 25 mg by mouth as needed (hypertension and fluid).  . isosorbide mononitrate (IMDUR) 120 MG 24 hr tablet Take 1 tablet by mouth once daily  . linaclotide (LINZESS) 72 MCG capsule Take 72 mcg by mouth as needed (IBS).  Marland Kitchen liraglutide (VICTOZA) 18 MG/3ML SOPN Inject 1.8 mg into the skin daily.  Marland Kitchen loratadine-pseudoephedrine (CLARITIN-D 24-HOUR) 10-240 MG 24 hr tablet Take 1  tablet by mouth as needed for allergies.  Marland Kitchen losartan (COZAAR) 50 MG tablet Take 50 mg by mouth daily.  . montelukast (SINGULAIR) 10 MG tablet Take 10 mg by mouth at bedtime.   . Multiple Vitamin (MULTIVITAMIN) capsule Take 1 capsule by mouth daily.  . nitroGLYCERIN (NITROSTAT) 0.4 MG SL tablet DISSOLVE ONE TABLET UNDER THE TONGUE EVERY 5 MINUTES AS NEEDED FOR CHEST PAIN.  DO NOT EXCEED A TOTAL OF 3 DOSES IN 15 MINUTES  . pantoprazole (PROTONIX) 40 MG tablet Take 1 tablet (40 mg total) by mouth 2 (two) times daily.  . Potassium Chloride CR (MICRO-K) 8 MEQ CPCR capsule CR Take 8 mEq by mouth daily.  . rosuvastatin (CRESTOR) 20 MG tablet Take 1 tablet (20 mg total) by mouth daily.  Marland Kitchen tiotropium (SPIRIVA) 18 MCG inhalation capsule Place 18 mcg into inhaler and inhale daily as needed (Asthma).     Allergies:   Penicillins and Tramadol   Social History   Socioeconomic History  . Marital status: Single    Spouse name: Not on file  . Number of children: 3  . Years of education: Not on file  . Highest education level: Not on file  Occupational History  . Occupation: retired  Tobacco Use  . Smoking status: Former Smoker    Packs/day: 1.00    Quit date: 05/17/2011    Years since quitting: 9.3  . Smokeless tobacco: Never Used  Vaping Use  . Vaping Use: Never used  Substance and Sexual Activity  . Alcohol use: No  . Drug use: No  . Sexual activity: Never  Other Topics Concern  . Not on file  Social History Narrative   Right handed   Social Determinants of Health   Financial Resource Strain: Not on file  Food Insecurity: Not on file  Transportation Needs: Not on file  Physical Activity: Not on file  Stress: Not on file  Social Connections: Not on file     Family History: The patient's family history includes Diabetes in her sister; Hypertension in her daughter. There is no history of Colon cancer.  ROS:   Please see the history of present illness.    All other systems reviewed  and are negative.  EKGs/Labs/Other Studies Reviewed:    The following studies were reviewed today:  EKG reveals sinus rhythm and nonspecific ST-T changes.   RIGHT/LEFT HEART CATH AND CORONARY ANGIOGRAPHY    Conclusion    Prox LAD lesion is 40% stenosed.  Prox RCA lesion is 50%  stenosed.  The left ventricular systolic function is normal.  LV end diastolic pressure is normal.  The left ventricular ejection fraction is 55-65% by visual estimate.  There is mild aortic valve stenosis.   1. Nonobstructive CAD. Unchanged from prior study. 2. Mild to moderate Aortic stenosis. Mean gradient 16 mm Hg. AVA 1.67 cm squared with index 0.95. 3. Normal LV filling pressures. 4. Normal right heart pressures 5. Normal cardiac output  Plan: recommend continued medical therapy. I do not see a cardiac cause for her chest pain.     Recent Labs: 02/03/2020: ALT 18 06/17/2020: BUN 16; Creatinine, Ser 1.12; Platelets 329 06/23/2020: Hemoglobin 9.9; Potassium 4.0; Sodium 144  Recent Lipid Panel    Component Value Date/Time   CHOL 159 02/03/2020 0937   TRIG 65 02/03/2020 0937   HDL 82 02/03/2020 0937   CHOLHDL 1.9 02/03/2020 0937   LDLCALC 64 02/03/2020 0937    Physical Exam:    VS:  BP 136/80   Pulse 82   Ht 5\' 4"  (1.626 m)   Wt 170 lb 9.6 oz (77.4 kg)   SpO2 99%   BMI 29.28 kg/m     Wt Readings from Last 3 Encounters:  09/16/20 170 lb 9.6 oz (77.4 kg)  07/17/20 166 lb 6.4 oz (75.5 kg)  06/23/20 157 lb (71.2 kg)     GEN: Patient is in no acute distress HEENT: Normal NECK: No JVD; No carotid bruits LYMPHATICS: No lymphadenopathy CARDIAC: Hear sounds regular, 2/6 systolic murmur at the apex. RESPIRATORY:  Clear to auscultation without rales, wheezing or rhonchi  ABDOMEN: Soft, non-tender, non-distended MUSCULOSKELETAL:  No edema; No deformity  SKIN: Warm and dry NEUROLOGIC:  Alert and oriented x 3 PSYCHIATRIC:  Normal affect   Signed, [name], MD   09/16/2020 3:58 PM    [facility]

## 2020-09-16 NOTE — Patient Instructions (Signed)

## 2020-09-18 DIAGNOSIS — Z6826 Body mass index (BMI) 26.0-26.9, adult: Secondary | ICD-10-CM | POA: Diagnosis not present

## 2020-09-18 DIAGNOSIS — I10 Essential (primary) hypertension: Secondary | ICD-10-CM | POA: Diagnosis not present

## 2020-09-18 DIAGNOSIS — E663 Overweight: Secondary | ICD-10-CM | POA: Diagnosis not present

## 2020-09-22 ENCOUNTER — Telehealth: Payer: Self-pay | Admitting: Cardiology

## 2020-09-22 DIAGNOSIS — M5459 Other low back pain: Secondary | ICD-10-CM | POA: Diagnosis not present

## 2020-09-22 DIAGNOSIS — M4316 Spondylolisthesis, lumbar region: Secondary | ICD-10-CM | POA: Diagnosis not present

## 2020-09-22 NOTE — Telephone Encounter (Signed)
Advised that medication came from her PCP and she needs to check with them for directions. Pt verbalized understanding and had no additional questions.

## 2020-09-22 NOTE — Telephone Encounter (Signed)
New Message:      Pt wants to know if she is supposed to be still taking Hydralazine?

## 2020-09-24 ENCOUNTER — Other Ambulatory Visit: Payer: Self-pay | Admitting: Oncology

## 2020-09-24 DIAGNOSIS — D508 Other iron deficiency anemias: Secondary | ICD-10-CM

## 2020-09-24 NOTE — Progress Notes (Signed)
Deborah Hutchinson  [address] [place],  [place]  [address] [phone]  Clinic Day:  09/25/2020  Referring physician: [name], MD  This document serves as a record of services personally performed by Marice Potter, MD. It was created on their behalf by [facility], a trained medical scribe. The creation of this record is based on the scribe's personal observations and the provider's statements to them.  HISTORY OF PRESENT ILLNESS:  The patient is a 75 y.o. female with a history of recurrent iron deficiency anemia.  She comes in today to reassess her iron and hemoglobin levels.  Since her last visit, the patient has had changes in the health.  She was diagnosed with peritoneal mesothelioma, which was incidentally found as she was undergoing hiatal hernia surgery.  As pathology showed it to be low grade, she is being followed conservatively by Dr Clovis Riley at [facility], who is seeing her on annual basis.  Overall, the patient claims to be feeling more tired.  She denies having any overt forms of blood loss.    VITALS:  Blood pressure (!) 202/96, pulse (!) 14, temperature 98.1 F (36.7 C), resp. rate 14, height 5\' 4"  (1.626 m), weight 165 lb 14.4 oz (75.3 kg), SpO2 98 %.  Wt Readings from Last 3 Encounters:  09/25/20 165 lb 14.4 oz (75.3 kg)  09/16/20 170 lb 9.6 oz (77.4 kg)  07/17/20 166 lb 6.4 oz (75.5 kg)    Body mass index is 28.48 kg/m.  Performance status (ECOG): 0 - Asymptomatic  PHYSICAL EXAM:  Physical Exam Constitutional:      General: She is not in acute distress.    Appearance: Normal appearance. She is normal weight.     Comments: She looks tired, lethargic  HENT:     Head: Normocephalic and atraumatic.  Eyes:     General: No scleral icterus.    Extraocular Movements: Extraocular movements intact.     Conjunctiva/sclera: Conjunctivae normal.     Pupils: Pupils are equal, round, and reactive to light.   Cardiovascular:     Rate and Rhythm: Normal rate and regular rhythm.     Pulses: Normal pulses.     Heart sounds: Normal heart sounds. No murmur heard. No friction rub. No gallop.   Pulmonary:     Effort: Pulmonary effort is normal. No respiratory distress.     Breath sounds: Normal breath sounds.  Abdominal:     General: Bowel sounds are normal. There is no distension.     Palpations: Abdomen is soft. There is no hepatomegaly, splenomegaly or mass.     Tenderness: There is no abdominal tenderness.  Musculoskeletal:        General: Normal range of motion.     Cervical back: Normal range of motion and neck supple.     Right lower leg: No edema.     Left lower leg: No edema.  Lymphadenopathy:     Cervical: No cervical adenopathy.  Skin:    General: Skin is warm and dry.  Neurological:     General: No focal deficit present.     Mental Status: She is alert and oriented to person, place, and time. Mental status is at baseline.  Psychiatric:        Mood and Affect: Mood normal.        Behavior: Behavior normal.        Thought Content: Thought content normal.  Judgment: Judgment normal.     LABS:     Ref. Range 09/25/2020 14:45  Iron Latest Ref Range: 28 - 170 ug/dL 35  UIBC Latest Units: ug/dL 522  TIBC Latest Ref Range: 250 - 450 ug/dL 557 (H)  Saturation Ratios Latest Ref Range: 10.4 - 31.8 % 6 (L)  Ferritin Latest Ref Range: 11 - 307 ng/mL 8 (L)     ASSESSMENT & PLAN:  Assessment/Plan:  A 75 year old woman with a history of iron deficiency anemia.  There has been a significant decline in her hemoglobin and MCV level since her last visit, with her iron parameters clearly showing recurrent iron deficiency anemia.  I will arrange for her to receive IV Feraheme 1020 mg over these next few weeks to replenish her iron stores and normalize her hemoglobin.  Per hospital records, she has not had an EGD/colonoscopy since April 2017.  Based upon this and her current iron  deficiency anemia, I will refer her to GI for a thorough workup.  Otherwise, I will see her back in 3 months to reassess her labs to see how well she responded to her upcoming IV iron.  The patient understands all the plans discussed today and is in agreement with them.   I, Rita Ohara, am acting as scribe for Marice Potter, MD    I have reviewed this report as typed by the medical scribe, and it is complete and accurate.  Dequincy Macarthur Critchley, MD

## 2020-09-25 ENCOUNTER — Inpatient Hospital Stay: Payer: Medicare HMO | Attending: Oncology | Admitting: Oncology

## 2020-09-25 ENCOUNTER — Inpatient Hospital Stay: Payer: Medicare HMO

## 2020-09-25 ENCOUNTER — Encounter: Payer: Self-pay | Admitting: Oncology

## 2020-09-25 ENCOUNTER — Other Ambulatory Visit: Payer: Self-pay

## 2020-09-25 DIAGNOSIS — D508 Other iron deficiency anemias: Secondary | ICD-10-CM

## 2020-09-25 DIAGNOSIS — D649 Anemia, unspecified: Secondary | ICD-10-CM | POA: Diagnosis not present

## 2020-09-25 DIAGNOSIS — D509 Iron deficiency anemia, unspecified: Secondary | ICD-10-CM

## 2020-09-25 HISTORY — DX: Iron deficiency anemia, unspecified: D50.9

## 2020-09-25 LAB — IRON AND TIBC
Iron: 35 ug/dL (ref 28–170)
Saturation Ratios: 6 % — ABNORMAL LOW (ref 10.4–31.8)
TIBC: 557 ug/dL — ABNORMAL HIGH (ref 250–450)
UIBC: 522 ug/dL

## 2020-09-25 LAB — CBC AND DIFFERENTIAL
HCT: 31 — AB (ref 36–46)
Hemoglobin: 9.4 — AB (ref 12.0–16.0)
Neutrophils Absolute: 3.48
Platelets: 406 — AB (ref 150–399)
WBC: 6

## 2020-09-25 LAB — BASIC METABOLIC PANEL
BUN: 16 (ref 4–21)
CO2: 23 — AB (ref 13–22)
Chloride: 107 (ref 99–108)
Creatinine: 1.2 — AB (ref 0.5–1.1)
Glucose: 91
Potassium: 4 (ref 3.4–5.3)
Sodium: 137 (ref 137–147)

## 2020-09-25 LAB — CBC: RBC: 4.06 (ref 3.87–5.11)

## 2020-09-25 LAB — COMPREHENSIVE METABOLIC PANEL
Albumin: 4.3 (ref 3.5–5.0)
Calcium: 9.4 (ref 8.7–10.7)

## 2020-09-25 LAB — HEPATIC FUNCTION PANEL
ALT: 19 (ref 7–35)
AST: 28 (ref 13–35)
Alkaline Phosphatase: 97 (ref 25–125)
Bilirubin, Total: 0.3

## 2020-09-25 LAB — FERRITIN: Ferritin: 8 ng/mL — ABNORMAL LOW (ref 11–307)

## 2020-09-28 ENCOUNTER — Encounter: Payer: Self-pay | Admitting: Oncology

## 2020-10-01 ENCOUNTER — Telehealth: Payer: Self-pay | Admitting: Cardiology

## 2020-10-01 ENCOUNTER — Inpatient Hospital Stay: Payer: Medicare HMO

## 2020-10-01 ENCOUNTER — Other Ambulatory Visit: Payer: Self-pay | Admitting: Pharmacist

## 2020-10-01 ENCOUNTER — Other Ambulatory Visit: Payer: Self-pay

## 2020-10-01 DIAGNOSIS — D508 Other iron deficiency anemias: Secondary | ICD-10-CM | POA: Diagnosis not present

## 2020-10-01 MED ORDER — SODIUM CHLORIDE 0.9 % IV SOLN
Freq: Once | INTRAVENOUS | Status: AC
  Filled 2020-10-01: qty 250

## 2020-10-01 MED ORDER — SODIUM CHLORIDE 0.9 % IV SOLN
510.0000 mg | Freq: Once | INTRAVENOUS | Status: AC
  Administered 2020-10-01: 510 mg via INTRAVENOUS
  Filled 2020-10-01: qty 17

## 2020-10-01 NOTE — Progress Notes (Signed)
Deborah Hutchinson, [place] states ok to precede with feraheme today despite BP 175/85

## 2020-10-01 NOTE — Telephone Encounter (Signed)
Spoke with patient she states her medications are all messed up. Her PCP wants an updated list of her medications sent to their office.

## 2020-10-01 NOTE — Patient Instructions (Signed)
Ferumoxytol injection What is this medicine? FERUMOXYTOL is an iron complex. Iron is used to make healthy red blood cells, which carry oxygen and nutrients throughout the body. This medicine is used to treat iron deficiency anemia. This medicine may be used for other purposes; ask your health care provider or pharmacist if you have questions. COMMON BRAND NAME(S): Feraheme What should I tell my health care provider before I take this medicine? They need to know if you have any of these conditions:  anemia not caused by low iron levels  high levels of iron in the blood  magnetic resonance imaging (MRI) test scheduled  an unusual or allergic reaction to iron, other medicines, foods, dyes, or preservatives  pregnant or trying to get pregnant  breast-feeding How should I use this medicine? This medicine is for injection into a vein. It is given by a health care professional in a hospital or clinic setting. Talk to your pediatrician regarding the use of this medicine in children. Special care may be needed. Overdosage: If you think you have taken too much of this medicine contact a poison control center or emergency room at once. NOTE: This medicine is only for you. Do not share this medicine with others. What if I miss a dose? It is important not to miss your dose. Call your doctor or health care professional if you are unable to keep an appointment. What may interact with this medicine? This medicine may interact with the following medications:  other iron products This list may not describe all possible interactions. Give your health care provider a list of all the medicines, herbs, non-prescription drugs, or dietary supplements you use. Also tell them if you smoke, drink alcohol, or use illegal drugs. Some items may interact with your medicine. What should I watch for while using this medicine? Visit your doctor or healthcare professional regularly. Tell your doctor or healthcare  professional if your symptoms do not start to get better or if they get worse. You may need blood work done while you are taking this medicine. You may need to follow a special diet. Talk to your doctor. Foods that contain iron include: whole grains/cereals, dried fruits, beans, or peas, leafy green vegetables, and organ meats (liver, kidney). What side effects may I notice from receiving this medicine? Side effects that you should report to your doctor or health care professional as soon as possible:  allergic reactions like skin rash, itching or hives, swelling of the face, lips, or tongue  breathing problems  changes in blood pressure  feeling faint or lightheaded, falls  fever or chills  flushing, sweating, or hot feelings  swelling of the ankles or feet Side effects that usually do not require medical attention (report to your doctor or health care professional if they continue or are bothersome):  diarrhea  headache  nausea, vomiting  stomach pain This list may not describe all possible side effects. Call your doctor for medical advice about side effects. You may report side effects to FDA at 1-800-FDA-1088. Where should I keep my medicine? This drug is given in a hospital or clinic and will not be stored at home. NOTE: This sheet is a summary. It may not cover all possible information. If you have questions about this medicine, talk to your doctor, pharmacist, or health care provider.  2021 Elsevier/Gold Standard (2016-06-13 20:21:10)  

## 2020-10-01 NOTE — Telephone Encounter (Signed)
PT is requesting a callback

## 2020-10-01 NOTE — Telephone Encounter (Signed)
Updated med list and will fax to patient's PCP per patient request.  I will print a copy for the patient to come and pick up as she would like a copy as well.

## 2020-10-02 ENCOUNTER — Ambulatory Visit (INDEPENDENT_AMBULATORY_CARE_PROVIDER_SITE_OTHER): Payer: Medicare HMO | Admitting: Gastroenterology

## 2020-10-02 ENCOUNTER — Encounter: Payer: Self-pay | Admitting: Gastroenterology

## 2020-10-02 ENCOUNTER — Other Ambulatory Visit: Payer: Medicare HMO

## 2020-10-02 VITALS — BP 168/88 | HR 75 | Ht 64.0 in | Wt 165.8 lb

## 2020-10-02 DIAGNOSIS — J309 Allergic rhinitis, unspecified: Secondary | ICD-10-CM | POA: Diagnosis not present

## 2020-10-02 DIAGNOSIS — Z860100 Personal history of colon polyps, unspecified: Secondary | ICD-10-CM

## 2020-10-02 DIAGNOSIS — K449 Diaphragmatic hernia without obstruction or gangrene: Secondary | ICD-10-CM

## 2020-10-02 DIAGNOSIS — D509 Iron deficiency anemia, unspecified: Secondary | ICD-10-CM

## 2020-10-02 DIAGNOSIS — K219 Gastro-esophageal reflux disease without esophagitis: Secondary | ICD-10-CM

## 2020-10-02 DIAGNOSIS — E663 Overweight: Secondary | ICD-10-CM | POA: Diagnosis not present

## 2020-10-02 DIAGNOSIS — I10 Essential (primary) hypertension: Secondary | ICD-10-CM | POA: Diagnosis not present

## 2020-10-02 DIAGNOSIS — C451 Mesothelioma of peritoneum: Secondary | ICD-10-CM

## 2020-10-02 DIAGNOSIS — R197 Diarrhea, unspecified: Secondary | ICD-10-CM

## 2020-10-02 DIAGNOSIS — Z6826 Body mass index (BMI) 26.0-26.9, adult: Secondary | ICD-10-CM | POA: Diagnosis not present

## 2020-10-02 MED ORDER — NA SULFATE-K SULFATE-MG SULF 17.5-3.13-1.6 GM/177ML PO SOLN: 1.0000 | Freq: Once | ORAL | 0 refills | Status: AC

## 2020-10-02 NOTE — Progress Notes (Signed)
IMPRESSION and PLAN:    #1. GERD with large HH s/p repair Nov 2021 with resolution of UGI symptoms.   #2. H/O colonic polyps-tubular adenomas (s/p colon 12/2018. Next due 12/2021).  #3. Peritoneal mesothelioma (low grade) at time of [facility] repair. Being followed by Dr Clovis Riley  #4. New onset IDA  #5. Diarrhea   Plan: -Protonix to continue -EGD/coplon after holding plavix 5 days. Clearence from Dr Rica Records -Stool studies for GI Pathogens fecal elastase, fat and Calprotectin)    I discussed the nature of the recommended EGD/Colonoscopy , as well as the indications, risks, alternatives and potential complications including, but not limited to, bleeding, infection, reaction to medication, damage to internal organs, cardiac and/or pulmonary problems, and perforation requiring surgery (1 to 2 in 1000). The possibility that significant findings could be missed was explained. All ? were answered. The patient gives consent for the procedures.   HPI:    Chief Complaint:   Deborah Hutchinson is a 75 y.o. female with CAD on plavix, DM2, HH, OSA, COPD, LBP d/t OA, HTN, HLD  Epi/LUQ pain 5-6 yrs  For follow-up visit  S/P HH repair by Dr. Abran Richard November 2021  All her upper GI symptoms have resolved.  She denies having any further heartburn, nausea, regurgitation.  No odynophagia or dysphagia.  Seen by Dr. Clovis Riley  March 2022 and underwent CT Abdo/pelvis as below which did not show any acute abnormalities.  She was found to have new onset iron deficiency anemia (labs as below with hemoglobin 9.4) without any GI bleeding.  No pica to ice.  She would occasionally have left sided abdominal pain-not significant.  This is related to back pain as well.  Diarrhea after eating    Past GI procedures: -EGD 08/2019: Candida esophagitis, no stricture, 7 cm hiatal hernia.  Treated empirically for Candida esophagitis  EGD 12/14/2018: Large HH, hyperplastic gastric polyps, eso stricture s/p dil 50  Fr. EGD 08/17/2015 mod HH, neg SB Bx for celiac. -UGI series 04/2019: HH, GERD, no strictures.  Ba tab passed without any problems. -Colonoscopy 12/14/2018-colonic polyps s/p polypectomy, mild pancolonic div. Bx- TA. -CT AP with contrast 02/09/2018: Neg, Stable left adrenal adenoma, small right inguinal hernia. -GES 08/2019: delayed gastric emptying (12% emptied at hr 1, 29% at hr 2, 47% at hr 3, 64% an hr 4).  -CT AP 07/2020 Impression: 1. Mildly thickened appearance of the hemidiaphragms without a discrete measurable mass, overall similar relative to the prior CT dated 01/08/2020 in this patient with biopsy-proven peritoneal mesothelioma involving the right hemidiaphragm. No definite new peritoneal nodules are identified. 2. No ascites. 3. Similar groundglass opacities/nodules throughout the lungs bilaterally, most conspicuous in the right lower lobe. These findings may be infectious, inflammatory, or neoplastic in etiology. 4. 4 mm solid right lower lobe pulmonary nodule, unchanged. The previously identified solid nodule in the right upper lobe is no longer seen. 5. Indeterminate bilateral adrenal lesions, measuring up to 1.7 cm on the left. Consider recharacterization with MRI is recommended. 6. Multiple additional ancillary findings, as above.   ============================  PATHOLOGY: PATHOLOGY: Final Pathologic Diagnosis  A. "Frozen peritoneal nodule":  Mesothelioma, epithelioid type, low tumor grade, trabecular pattern. See comment.  B. "Peritoneal nodule": Mesothelioma, epithelioid type, low tumor grade, trabecular pattern. See comment.     LHC 05/08/2019:  Prox RCA lesion is 50% stenosed.  Prox LAD lesion is 40% stenosed.  The left ventricular systolic function is normal.  LV end diastolic pressure is normal.  The left ventricular ejection fraction is greater than 65% by visual estimate.  There is no mitral valve regurgitation. Past Medical  History:  Diagnosis Date  . Aftercare following surgery 03/26/2020  . Angina pectoris ([place]) 06/17/2020  . Anxiety   . Arthritis   . Asthma   . Bronchitis   . CAD (coronary artery disease) 02/13/2019  . Chest pain 05/08/2019  . Chest pain of uncertain etiology   . Chronic bilateral low back pain without sciatica 07/11/2019   Formatting of this note might be different from the original. Added automatically from request for surgery [id number]  . Chronic obstructive pulmonary disease ([place]) 02/09/2017   Formatting of this note might be different from the original. Last Assessment & Plan:  I will review her PFTs after I obtain records from her pulmonologist. Based on this we will decide if she needs inhalers, subjectively they do not seem to have helped her at all. She may benefit from a pulmonary rehabilitation program Formatting of this note might be different from the original. Last Assessment   . COPD (chronic obstructive pulmonary disease) ([place]) 02/09/2017  . 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 Diabetes mellitus   . Diabetes mellitus due to underlying condition with unspecified complications ([place]) 06/14/7122  . Dyslipidemia 06/10/2015  . Esophageal dysphagia 09/02/2019   Formatting of this note might be different from the original. Added automatically from request for surgery [id number]  . Essential hypertension 06/10/2015  . Gastroparesis 09/04/2019  . GERD (gastroesophageal reflux disease)   . H/O hiatal hernia   . Headache 09/24/2018  . Heart murmur   . Hyperlipemia   . Hypertension   . Mixed dyslipidemia 06/10/2015  . Moderate aortic stenosis 05/16/2019  . Nausea and vomiting 06/16/2020  . Nonspecific (abnormal) findings on radiological and other examination of gastrointestinal tract 05/19/2011  . OSA (obstructive sleep apnea) 02/09/2017  . Pain and swelling of toe of right foot 07/15/2016  . Palpitations 06/02/2016  . 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 Added automatically from request for surgery [id number]  . Peritoneal mesothelioma ([place]) 01/16/2020  . S/P lumbar fusion 03/26/2020  . Shortness of breath    on  excertion  . Sleep apnea     Current Outpatient Medications  Medication Sig Dispense Refill  . albuterol (PROVENTIL HFA;VENTOLIN HFA) 108 (90 Base) MCG/ACT inhaler Inhale 2 puffs into the lungs every 6 (six) hours as needed for wheezing or shortness of breath. 3 Inhaler 3  . amLODipine (NORVASC) 10 MG tablet Take 10 mg by mouth daily.    . budesonide-formoterol (SYMBICORT) 160-4.5 MCG/ACT inhaler Inhale 2 puffs into the lungs 2 (two) times daily. 3 Inhaler 3  . buPROPion (WELLBUTRIN XL) 300 MG 24 hr tablet Take 300 mg by mouth daily.     . Cholecalciferol (VITAMIN D PO) Take 5,000 Units by mouth as directed. Every 2 weeks    . clopidogrel (PLAVIX) 75 MG tablet Take 1 tablet (75 mg total) by mouth daily. 30 tablet 11  . dicyclomine (BENTYL) 20 MG tablet Take 20 mg by mouth every 6 (six) hours as needed.     .      . escitalopram (LEXAPRO) 10 MG tablet Take 10 mg by mouth daily.    . famotidine (PEPCID) 40 MG tablet Take 1 tablet (40 mg total) by mouth at bedtime. 180 tablet 3  . fluticasone (VERAMYST) 27.5 MCG/SPRAY  nasal spray Place 2 sprays into the nose daily.    Marland Kitchen gabapentin (NEURONTIN) 300 MG capsule Take 300 mg by mouth 3 (three) times daily as needed.     . hydrALAZINE (APRESOLINE) 50 MG tablet Take 1 tablet (50 mg total) by mouth 3 (three) times daily. 180 tablet 2  . hydrochlorothiazide (HYDRODIURIL) 25 MG tablet Take 1 tablet (25 mg total) by mouth daily. 60 tablet 2  . Lancets (ACCU-CHEK MULTICLIX) lancets     . linaclotide (LINZESS) 72 MCG capsule Take 72 mcg by mouth as needed.    Marland Kitchen losartan (COZAAR) 100 MG tablet Take 1 tablet (100 mg total) by mouth daily. 60 tablet 2  . metFORMIN (GLUCOPHAGE) 500 MG tablet Take 500 mg by mouth 2 (two) times  daily with a meal.     . montelukast (SINGULAIR) 10 MG tablet Take 10 mg by mouth at bedtime.     . Multiple Vitamin (MULTIVITAMIN) capsule Take 1 capsule by mouth daily.      Marland Kitchen oxybutynin (DITROPAN-XL) 10 MG 24 hr tablet Take 10 mg by mouth daily.    . pantoprazole (PROTONIX) 40 MG tablet Take 1 tablet (40 mg total) by mouth daily. 60 tablet 2  . Potassium Chloride CR (MICRO-K) 8 MEQ CPCR capsule CR TAKE 1 CAPSULE BY MOUTH ONCE DAILY FOR LOW POTASSIUM    . simvastatin (ZOCOR) 20 MG tablet Take 20 mg by mouth at bedtime.    Marland Kitchen spironolactone (ALDACTONE) 25 MG tablet Take 1 tablet (25 mg total) by mouth daily. 60 tablet 2  . traMADol (ULTRAM) 50 MG tablet Take 50 mg by mouth 2 (two) times daily as needed for pain.    Marland Kitchen VICTOZA 18 MG/3ML SOPN 12 mg daily.     . BD PEN NEEDLE NANO U/F 32G X 4 MM MISC     . isosorbide mononitrate (IMDUR) 120 MG 24 hr tablet Take 1 tablet (120 mg total) by mouth daily. (Patient not taking: Reported on 06/10/2019) 60 tablet 2  . nitroGLYCERIN (NITROSTAT) 0.4 MG SL tablet DISSOLVE ONE TABLET UNDER THE TONGUE EVERY 5 MINUTES AS NEEDED FOR CHEST PAIN.  DO NOT EXCEED A TOTAL OF 3 DOSES IN 15 MINUTES (Patient not taking: Reported on 06/10/2019) 25 tablet 5   No current facility-administered medications for this visit.    Past Surgical History:  Procedure Laterality Date  . ABDOMINAL HYSTERECTOMY    . ABDOMINAL HYSTERECTOMY    . BREAST SURGERY     begin mass,left  breast  . COLONOSCOPY  08/17/2015   Colonic polyp status post polypectomy. Mild pancolonic diverticulosis. Highly redundant colon.   Marland Kitchen DILATION AND CURETTAGE OF UTERUS     one  . ESOPHAGOGASTRODUODENOSCOPY  08/17/2015   Moderate hiatal hernia. Otherwise noraml EGD.   Marland Kitchen ESOPHAGOGASTRODUODENOSCOPY  09/03/2019   [place]  . EUS  05/19/2011   Procedure: UPPER ENDOSCOPIC ULTRASOUND (EUS) LINEAR;  Surgeon: Owens Loffler, MD;  Location: [facility];  Service: Endoscopy;  Laterality: N/A;  radial  linear   . HYSTEROTOMY    . KNEE SURGERY    . LEFT HEART CATH AND CORONARY ANGIOGRAPHY    . LEFT HEART CATH AND CORONARY ANGIOGRAPHY N/A 05/08/2019   Procedure: LEFT HEART CATH AND CORONARY ANGIOGRAPHY;  Surgeon: Burnell Blanks, MD;  Location: [place] [facility];  Service: Cardiovascular;  Laterality: N/A;  . right thumb     tendon repaire  . RIGHT/LEFT HEART CATH AND CORONARY ANGIOGRAPHY N/A 06/23/2020   Procedure:  RIGHT/LEFT HEART CATH AND CORONARY ANGIOGRAPHY;  Surgeon: Martinique, Peter M, MD;  Location: [place] [facility];  Service: Cardiovascular;  Laterality: N/A;  . SHOULDER SURGERY     rotator cuff repair  . TRANSFORAMINAL LUMBAR INTERBODY FUSION L4-5   02/27/2020  . WRIST SURGERY     rigth    Family History  Problem Relation Age of Onset  . Diabetes Sister   . Hypertension Daughter   . Colon cancer Neg Hx   . Liver disease Neg Hx   . Pancreatic cancer Neg Hx   . Esophageal cancer Neg Hx     Social History   Tobacco Use  . Smoking status: Former Smoker    Packs/day: 1.00    Quit date: 05/17/2011    Years since quitting: 9.3  . Smokeless tobacco: Never Used  Vaping Use  . Vaping Use: Never used  Substance Use Topics  . Alcohol use: No  . Drug use: No    Allergies  Allergen Reactions  . Penicillins Rash    Reaction was 30years ago  Has never taken again Reaction was 30years ago Has never taken again Has patient had a PCN reaction causing anaphylaxis, immediate rash, facial/tongue/throat swelling, SOB or lightheadedness with hypotension? no Has patient had a PCN reaction causing severe rash involving mucus membranes or skin necrosis? no Has patient had a PCN reaction that required hospitilization?no  Has patient had a PCN reaction occurring within the last 10 years? no If all of the above answers are "no" then may proceed with cephalosporin use  . Tramadol Other (See Comments)    Hallunications       Review of Systems: All systems reviewed and  negative except where noted in HPI.    Physical Exam:     BP (!) 168/88   Pulse 75   Ht 5\' 4"  (1.626 m)   Wt 165 lb 12.8 oz (75.2 kg)   SpO2 98%   BMI 28.46 kg/m   Gen: awake, alert, NAD HEENT: anicteric, no pallor CV: RRR, no mrg Pulm: CTA b/l Abd: soft, NT/ND, +BS throughout Ext: no c/c/e Neuro: nonfocal  CBC Latest Ref Rng & Units 09/25/2020 06/23/2020 06/23/2020  WBC - 6.0 - -  Hemoglobin 12.0 - 16.0 9.4(A) 9.9(L) 9.9(L)  Hematocrit 36 - 46 31(A) 29.0(L) 29.0(L)  Platelets 150 - 399 406(A) - -   CMP Latest Ref Rng & Units 09/25/2020 06/23/2020 06/23/2020  Glucose 65 - 99 mg/dL - - -  BUN 4 - 21 16 - -  Creatinine 0.5 - 1.1 1.2(A) - -  Sodium 137 - 147 137 144 145  Potassium 3.4 - 5.3 4.0 4.0 3.9  Chloride 99 - 108 107 - -  CO2 13 - 22 23(A) - -  Calcium 8.7 - 10.7 9.4 - -  Total Protein 6.0 - 8.5 g/dL - - -  Total Bilirubin 0.0 - 1.2 mg/dL - - -  Alkaline Phos 25 - 125 97 - -  AST 13 - 35 28 - -  ALT 7 - 35 19 - -       Deborah Dingley,MD 10/02/2020, 9:40 AM

## 2020-10-02 NOTE — Patient Instructions (Addendum)
If you are age 77 or older, your body mass index should be between 23-30. Your Body mass index is 28.46 kg/m. If this is out of the aforementioned range listed, please consider follow up with your Primary Care Provider.  If you are age 38 or younger, your body mass index should be between 19-25. Your Body mass index is 28.46 kg/m. If this is out of the aformentioned range listed, please consider follow up with your Primary Care Provider.   __________________________________________________________  The [facility] providers would like to encourage you to use [facility] to communicate with providers for non-urgent requests or questions.  Due to long hold times on the telephone, sending your provider a message by [facility] may be a faster and more efficient way to get a response.  Please allow 48 business hours for a response.  Please remember that this is for non-urgent requests.   You have been scheduled for an endoscopy and colonoscopy. Please follow the written instructions given to you at your visit today. Please pick up your prep supplies at the pharmacy within the next 1-3 days. If you use inhalers (even only as needed), please bring them with you on the day of your procedure.  Please go to the lab on the 2nd floor [address] before you leave the office today.   You will be contacted by our office prior to your procedure for directions on holding your Plavix.  If you do not hear from our office 1 week prior to your scheduled procedure, please call [phone] to discuss.   Fax number is [phone]  Thank you,  Dr. Jackquline Denmark

## 2020-10-07 ENCOUNTER — Encounter: Payer: Self-pay | Admitting: Oncology

## 2020-10-07 DIAGNOSIS — I2510 Atherosclerotic heart disease of native coronary artery without angina pectoris: Secondary | ICD-10-CM | POA: Diagnosis not present

## 2020-10-07 DIAGNOSIS — G5793 Unspecified mononeuropathy of bilateral lower limbs: Secondary | ICD-10-CM | POA: Diagnosis not present

## 2020-10-07 DIAGNOSIS — M5440 Lumbago with sciatica, unspecified side: Secondary | ICD-10-CM | POA: Diagnosis not present

## 2020-10-07 DIAGNOSIS — M1990 Unspecified osteoarthritis, unspecified site: Secondary | ICD-10-CM | POA: Diagnosis not present

## 2020-10-08 ENCOUNTER — Other Ambulatory Visit: Payer: Self-pay

## 2020-10-08 ENCOUNTER — Inpatient Hospital Stay: Payer: Medicare HMO | Attending: Oncology

## 2020-10-08 VITALS — BP 178/79 | HR 77 | Temp 98.1°F | Resp 16 | Ht 64.0 in | Wt 163.5 lb

## 2020-10-08 DIAGNOSIS — D508 Other iron deficiency anemias: Secondary | ICD-10-CM | POA: Insufficient documentation

## 2020-10-08 MED ORDER — SODIUM CHLORIDE 0.9 % IV SOLN
510.0000 mg | Freq: Once | INTRAVENOUS | Status: AC
  Administered 2020-10-08: 510 mg via INTRAVENOUS
  Filled 2020-10-08: qty 510

## 2020-10-08 MED ORDER — SODIUM CHLORIDE 0.9 % IV SOLN
Freq: Once | INTRAVENOUS | Status: AC
  Filled 2020-10-08: qty 250

## 2020-10-08 NOTE — Patient Instructions (Signed)
Ferumoxytol injection What is this medicine? FERUMOXYTOL is an iron complex. Iron is used to make healthy red blood cells, which carry oxygen and nutrients throughout the body. This medicine is used to treat iron deficiency anemia. This medicine may be used for other purposes; ask your health care provider or pharmacist if you have questions. COMMON BRAND NAME(S): Feraheme What should I tell my health care provider before I take this medicine? They need to know if you have any of these conditions:  anemia not caused by low iron levels  high levels of iron in the blood  magnetic resonance imaging (MRI) test scheduled  an unusual or allergic reaction to iron, other medicines, foods, dyes, or preservatives  pregnant or trying to get pregnant  breast-feeding How should I use this medicine? This medicine is for injection into a vein. It is given by a health care professional in a hospital or clinic setting. Talk to your pediatrician regarding the use of this medicine in children. Special care may be needed. Overdosage: If you think you have taken too much of this medicine contact a poison control center or emergency room at once. NOTE: This medicine is only for you. Do not share this medicine with others. What if I miss a dose? It is important not to miss your dose. Call your doctor or health care professional if you are unable to keep an appointment. What may interact with this medicine? This medicine may interact with the following medications:  other iron products This list may not describe all possible interactions. Give your health care provider a list of all the medicines, herbs, non-prescription drugs, or dietary supplements you use. Also tell them if you smoke, drink alcohol, or use illegal drugs. Some items may interact with your medicine. What should I watch for while using this medicine? Visit your doctor or healthcare professional regularly. Tell your doctor or healthcare  professional if your symptoms do not start to get better or if they get worse. You may need blood work done while you are taking this medicine. You may need to follow a special diet. Talk to your doctor. Foods that contain iron include: whole grains/cereals, dried fruits, beans, or peas, leafy green vegetables, and organ meats (liver, kidney). What side effects may I notice from receiving this medicine? Side effects that you should report to your doctor or health care professional as soon as possible:  allergic reactions like skin rash, itching or hives, swelling of the face, lips, or tongue  breathing problems  changes in blood pressure  feeling faint or lightheaded, falls  fever or chills  flushing, sweating, or hot feelings  swelling of the ankles or feet Side effects that usually do not require medical attention (report to your doctor or health care professional if they continue or are bothersome):  diarrhea  headache  nausea, vomiting  stomach pain This list may not describe all possible side effects. Call your doctor for medical advice about side effects. You may report side effects to FDA at 1-800-FDA-1088. Where should I keep my medicine? This drug is given in a hospital or clinic and will not be stored at home. NOTE: This sheet is a summary. It may not cover all possible information. If you have questions about this medicine, talk to your doctor, pharmacist, or health care provider.  2021 Elsevier/Gold Standard (2016-06-13 20:21:10)  

## 2020-10-09 ENCOUNTER — Other Ambulatory Visit (INDEPENDENT_AMBULATORY_CARE_PROVIDER_SITE_OTHER): Payer: Medicare HMO

## 2020-10-09 DIAGNOSIS — Z86010 Personal history of colonic polyps: Secondary | ICD-10-CM

## 2020-10-09 DIAGNOSIS — D509 Iron deficiency anemia, unspecified: Secondary | ICD-10-CM

## 2020-10-09 DIAGNOSIS — C451 Mesothelioma of peritoneum: Secondary | ICD-10-CM | POA: Diagnosis not present

## 2020-10-09 DIAGNOSIS — K219 Gastro-esophageal reflux disease without esophagitis: Secondary | ICD-10-CM | POA: Diagnosis not present

## 2020-10-09 DIAGNOSIS — K449 Diaphragmatic hernia without obstruction or gangrene: Secondary | ICD-10-CM | POA: Diagnosis not present

## 2020-10-09 DIAGNOSIS — R197 Diarrhea, unspecified: Secondary | ICD-10-CM | POA: Diagnosis not present

## 2020-10-09 DIAGNOSIS — A09 Infectious gastroenteritis and colitis, unspecified: Secondary | ICD-10-CM | POA: Diagnosis not present

## 2020-10-12 DIAGNOSIS — H2513 Age-related nuclear cataract, bilateral: Secondary | ICD-10-CM | POA: Diagnosis not present

## 2020-10-12 DIAGNOSIS — H43819 Vitreous degeneration, unspecified eye: Secondary | ICD-10-CM | POA: Diagnosis not present

## 2020-10-12 DIAGNOSIS — E113293 Type 2 diabetes mellitus with mild nonproliferative diabetic retinopathy without macular edema, bilateral: Secondary | ICD-10-CM | POA: Diagnosis not present

## 2020-10-12 LAB — GI PROFILE, STOOL, PCR

## 2020-10-13 ENCOUNTER — Telehealth: Payer: Self-pay

## 2020-10-13 LAB — FECAL FAT, QUALITATIVE: FECAL FAT, QUALITATIVE: NORMAL

## 2020-10-13 NOTE — Telephone Encounter (Signed)
Patient is aware to hold Plavix 5 days prior to her procedure with Dr Lyndel Safe in July. Clearance came from Dr Rica Records. Patient was told to call with any questions but she understands

## 2020-10-15 ENCOUNTER — Telehealth: Payer: Self-pay | Admitting: Gastroenterology

## 2020-10-15 LAB — PANCREATIC ELASTASE, FECAL: Pancreatic Elastase-1, Stool: >500 mcg/g

## 2020-10-15 NOTE — Telephone Encounter (Signed)
Spoke with pt and let her know banding cannot be done at the same time of her ECL due to insurance issues. Bandings are done in the office setting.

## 2020-10-16 LAB — CALPROTECTIN, FECAL: Calprotectin, Fecal: 27 ug/g (ref 0–120)

## 2020-10-20 DIAGNOSIS — M25473 Effusion, unspecified ankle: Secondary | ICD-10-CM | POA: Diagnosis not present

## 2020-10-20 DIAGNOSIS — I10 Essential (primary) hypertension: Secondary | ICD-10-CM | POA: Diagnosis not present

## 2020-10-20 DIAGNOSIS — M940 Chondrocostal junction syndrome [Tietze]: Secondary | ICD-10-CM | POA: Diagnosis not present

## 2020-10-20 DIAGNOSIS — J45909 Unspecified asthma, uncomplicated: Secondary | ICD-10-CM | POA: Diagnosis not present

## 2020-10-20 DIAGNOSIS — M159 Polyosteoarthritis, unspecified: Secondary | ICD-10-CM | POA: Diagnosis not present

## 2020-10-20 DIAGNOSIS — J449 Chronic obstructive pulmonary disease, unspecified: Secondary | ICD-10-CM | POA: Diagnosis not present

## 2020-10-23 DIAGNOSIS — M4316 Spondylolisthesis, lumbar region: Secondary | ICD-10-CM | POA: Diagnosis not present

## 2020-10-23 DIAGNOSIS — M5459 Other low back pain: Secondary | ICD-10-CM | POA: Diagnosis not present

## 2020-10-30 ENCOUNTER — Telehealth: Payer: Self-pay | Admitting: Gastroenterology

## 2020-10-30 DIAGNOSIS — Z0000 Encounter for general adult medical examination without abnormal findings: Secondary | ICD-10-CM | POA: Diagnosis not present

## 2020-10-30 DIAGNOSIS — Z9181 History of falling: Secondary | ICD-10-CM | POA: Diagnosis not present

## 2020-10-30 DIAGNOSIS — Z1331 Encounter for screening for depression: Secondary | ICD-10-CM | POA: Diagnosis not present

## 2020-10-30 DIAGNOSIS — Z139 Encounter for screening, unspecified: Secondary | ICD-10-CM | POA: Diagnosis not present

## 2020-10-30 DIAGNOSIS — E785 Hyperlipidemia, unspecified: Secondary | ICD-10-CM | POA: Diagnosis not present

## 2020-10-30 NOTE — Telephone Encounter (Signed)
Inbound call from patient. States when her stomach is upset, she poops on herself in her sleep. It has happened 2x this week. Wants to know what she can do to help her stomachissues? Best contact number [phone]

## 2020-10-30 NOTE — Telephone Encounter (Signed)
Pt states at night sometimes she poops. States it happens about once a week and has been happening for about a month. Pt reports her stomach is not bothering her, no cramping. She wants to know what Dr. Lyndel Safe recommends. Please advise.

## 2020-11-02 NOTE — Telephone Encounter (Signed)
Spoke with patient, advised of rec's per MD. Pt verbalized understanding and nothing further needed at this time.

## 2020-11-02 NOTE — Telephone Encounter (Signed)
Not having any diarrhea She just needs Kegel's exercises for now Also do not eat late at night-at least 3 hours before going to bed.  RG

## 2020-11-05 ENCOUNTER — Other Ambulatory Visit: Payer: Self-pay | Admitting: Cardiology

## 2020-11-06 ENCOUNTER — Ambulatory Visit: Payer: Medicare HMO | Admitting: Allergy

## 2020-11-06 DIAGNOSIS — E785 Hyperlipidemia, unspecified: Secondary | ICD-10-CM | POA: Diagnosis not present

## 2020-11-06 DIAGNOSIS — M1990 Unspecified osteoarthritis, unspecified site: Secondary | ICD-10-CM | POA: Diagnosis not present

## 2020-11-06 DIAGNOSIS — I10 Essential (primary) hypertension: Secondary | ICD-10-CM | POA: Diagnosis not present

## 2020-11-06 DIAGNOSIS — I2510 Atherosclerotic heart disease of native coronary artery without angina pectoris: Secondary | ICD-10-CM | POA: Diagnosis not present

## 2020-11-06 DIAGNOSIS — M5440 Lumbago with sciatica, unspecified side: Secondary | ICD-10-CM | POA: Diagnosis not present

## 2020-11-06 DIAGNOSIS — G5793 Unspecified mononeuropathy of bilateral lower limbs: Secondary | ICD-10-CM | POA: Diagnosis not present

## 2020-11-10 ENCOUNTER — Encounter: Payer: Self-pay | Admitting: Allergy

## 2020-11-10 ENCOUNTER — Other Ambulatory Visit: Payer: Self-pay

## 2020-11-10 ENCOUNTER — Ambulatory Visit (INDEPENDENT_AMBULATORY_CARE_PROVIDER_SITE_OTHER): Payer: Medicare HMO | Admitting: Allergy

## 2020-11-10 VITALS — BP 134/72 | HR 72 | Resp 16 | Ht 64.5 in | Wt 161.6 lb

## 2020-11-10 DIAGNOSIS — J449 Chronic obstructive pulmonary disease, unspecified: Secondary | ICD-10-CM | POA: Diagnosis not present

## 2020-11-10 DIAGNOSIS — J3089 Other allergic rhinitis: Secondary | ICD-10-CM | POA: Diagnosis not present

## 2020-11-10 MED ORDER — LEVOCETIRIZINE DIHYDROCHLORIDE 5 MG PO TABS: 5.0000 mg | ORAL_TABLET | Freq: Every evening | ORAL | 0 refills | Status: DC

## 2020-11-10 MED ORDER — AZELASTINE HCL 0.1 % NA SOLN: NASAL | 0 refills | Status: DC

## 2020-11-10 MED ORDER — SPACER/AERO-HOLDING CHAMBERS DEVI: 1 refills | Status: DC

## 2020-11-10 MED ORDER — BREZTRI AEROSPHERE 160-9-4.8 MCG/ACT IN AERO: INHALATION_SPRAY | RESPIRATORY_TRACT | 0 refills | Status: DC

## 2020-11-10 NOTE — Progress Notes (Signed)
New Patient Note  RE: Deborah Hutchinson MRN: [id number] DOB: [birth year] Date of Office Visit: 11/10/2020  Primary care provider: [name], MD  Chief Complaint: Sinus issues  History of present illness: Deborah Hutchinson is a 75 y.o. female presenting today for evaluation of rhinitis.  She presents today with her daughter.  She states her nose runs all the time for about the past year and is year-round.  Worse in the winter.  She also reports nasal congestion, sore throat and sneezing.   She does take singulair nightly for past 3-4 years.  She takes zyrtec nightly for past 4 months.  Can't tell difference with zyrtec use.   She uses fluticasone once a day for past year and sometimes helps with the congestion but not the drainage.  She did complete a prednisone taper about 2 months for sinus symptoms.   She has asthma.  She has been told she has COPD but then has been told by other physicians that she doesn't have COPD.  So she is not sure if she has COPD or not.  She does report shortness of breath as her primary symptom on most days.  She also reports chest tightness.  She states she does have more shortness of breath if its hot outside and has been outside for a while.  She has symbicort and spiriva taking both 2 puffs once a day.  States was doing 2 puffs twice a day of both medications but a recent refill of these she states the instrucations changed to 2 puffs once a day.   She does feel like the shortness of breath was less when she was using the Symbicort 2 puffs twice a day.   She denies using albuterol for symptom relief.  Denies nighttime awakenings.     No history of eczema or food allergy.    She had allergy testing about 20 years ago and states she was positive to environmental allergens.   Review of systems: Review of Systems  Constitutional: Negative.   HENT:         See HPI  Eyes: Negative.   Respiratory:  Positive for shortness of breath.        See HPI   Cardiovascular: Negative.   Gastrointestinal: Negative.   Musculoskeletal: Negative.   Skin: Negative.   Neurological: Negative.    All other systems negative unless noted above in HPI  Past medical history: Past Medical History:  Diagnosis Date   Aftercare following surgery 03/26/2020   Angina pectoris ([place]) 06/17/2020   Anxiety    Arthritis    Asthma    Bronchitis    CAD (coronary artery disease) 02/13/2019   Chest pain 05/08/2019   Chest pain of uncertain etiology    Chronic bilateral low back pain without sciatica 07/11/2019   Formatting of this note might be different from the original. Added automatically from request for surgery [id number]   Chronic obstructive pulmonary disease ([place]) 02/09/2017   Formatting of this note might be different from the original. Last Assessment & Plan:  I will review her PFTs after I obtain records from her pulmonologist. Based on this we will decide if she needs inhalers, subjectively they do not seem to have helped her at all. She may benefit from a pulmonary rehabilitation program Formatting of this note might be different from the original. Last Assessment    COPD (chronic obstructive pulmonary disease) ([place]) 02/09/2017   Coronary artery disease involving native coronary artery of native  heart without angina pectoris 06/10/2015   Diabetes mellitus    Diabetes mellitus due to underlying condition with unspecified complications ([place]) 10/12/4401   Dyslipidemia 06/10/2015   Esophageal dysphagia 09/02/2019   Formatting of this note might be different from the original. Added automatically from request for surgery [id number]   Essential hypertension 06/10/2015   Gastroparesis 09/04/2019   GERD (gastroesophageal reflux disease)    H/O hiatal hernia    Headache 09/24/2018   Heart murmur    Hyperlipemia    Hypertension    Mixed dyslipidemia 06/10/2015   Moderate aortic stenosis 05/16/2019   Nausea and vomiting 06/16/2020   Nonspecific (abnormal) findings on radiological  and other examination of gastrointestinal tract 05/19/2011   OSA (obstructive sleep apnea) 02/09/2017   Pain and swelling of toe of right foot 07/15/2016   Palpitations 06/02/2016   Paraesophageal hernia 10/30/2019   Formatting of this note might be different from the original. Added automatically from request for surgery [id number]   Peritoneal mesothelioma ([place]) 01/16/2020   S/P lumbar fusion 03/26/2020   Shortness of breath    on  excertion   Sleep apnea     Past surgical history: Past Surgical History:  Procedure Laterality Date   ABDOMINAL HYSTERECTOMY     ABDOMINAL HYSTERECTOMY     BREAST SURGERY     begin mass,left  breast   COLONOSCOPY  08/17/2015   Colonic polyp status post polypectomy. Mild pancolonic diverticulosis. Highly redundant colon.    DILATION AND CURETTAGE OF UTERUS     one   ESOPHAGOGASTRODUODENOSCOPY  08/17/2015   Moderate hiatal hernia. Otherwise [facility] EGD.    ESOPHAGOGASTRODUODENOSCOPY  09/03/2019   [place]   EUS  05/19/2011   Procedure: UPPER ENDOSCOPIC ULTRASOUND (EUS) LINEAR;  Surgeon: Owens Loffler, MD;  Location: [facility];  Service: Endoscopy;  Laterality: N/A;  radial linear    HYSTEROTOMY     KNEE SURGERY     LEFT HEART CATH AND CORONARY ANGIOGRAPHY     LEFT HEART CATH AND CORONARY ANGIOGRAPHY N/A 05/08/2019   Procedure: LEFT HEART CATH AND CORONARY ANGIOGRAPHY;  Surgeon: Burnell Blanks, MD;  Location: [facility];  Service: Cardiovascular;  Laterality: N/A;   right thumb     tendon repaire   RIGHT/LEFT HEART CATH AND CORONARY ANGIOGRAPHY N/A 06/23/2020   Procedure: RIGHT/LEFT HEART CATH AND CORONARY ANGIOGRAPHY;  Surgeon: Martinique, Peter M, MD;  Location: [facility];  Service: Cardiovascular;  Laterality: N/A;   SHOULDER SURGERY     rotator cuff repair   TRANSFORAMINAL LUMBAR INTERBODY FUSION L4-5   02/27/2020   WRIST SURGERY     rigth    Family history:  Family History  Problem Relation Age of [place]    Diabetes Sister    Cancer Sister    Hypertension Daughter    Colon cancer Neg Hx    Liver disease Neg Hx    Pancreatic cancer Neg Hx    Esophageal cancer Neg Hx     Social history: Lives in a home with carpeting with gas heating and central cooling.  No pets in the home.  There is no concern for water damage, mildew or roaches in the home.  She is retired.  She denies a smoking/vaping status.    Medication List: Current Outpatient Medications  Medication Sig Dispense Refill   albuterol (PROVENTIL HFA;VENTOLIN HFA) 108 (90 Base) MCG/ACT inhaler Inhale 2 puffs into the lungs every 6 (six) hours as needed for wheezing or  shortness of breath. 3 Inhaler 3   azelastine (ASTELIN) 0.1 % nasal spray Use two sprays in each nostril twice daily as directed for nasal drainage. 30 mL 0   Budeson-Glycopyrrol-Formoterol (BREZTRI AEROSPHERE) 160-9-4.8 MCG/ACT AERO Inhale two puffs using spacer twice daily to prevent cough or wheeze.  Rinse, gargle, and spit after use. 10.7 g 0   buPROPion (WELLBUTRIN XL) 300 MG 24 hr tablet Take 300 mg by mouth daily.      canagliflozin (INVOKANA) 300 MG TABS tablet Take 300 mg by mouth daily.     CELECOXIB PO Take by mouth as needed.     Cholecalciferol (VITAMIN D3) 1.25 MG (50000 UT) CAPS Take 50,000 Units by mouth every 30 (thirty) days.     clopidogrel (PLAVIX) 75 MG tablet Take 1 tablet (75 mg total) by mouth daily. 90 tablet 2   diclofenac Sodium (VOLTAREN) 1 % GEL Apply 4 g topically daily as needed (pain).     dicyclomine (BENTYL) 10 MG capsule Take 10 mg by mouth 2 (two) times daily as needed for spasms.     donepezil (ARICEPT) 10 MG tablet Take 10 mg by mouth daily at 12 noon.     escitalopram (LEXAPRO) 20 MG tablet Take 20 mg by mouth daily.     famotidine (PEPCID) 20 MG tablet Take 20 mg by mouth in the morning and at bedtime.     fluticasone (FLONASE) 50 MCG/ACT nasal spray Place 2 sprays into both nostrils daily as needed for allergies.     isosorbide  mononitrate (IMDUR) 120 MG 24 hr tablet Take 1 tablet by mouth once daily 60 tablet 6   levocetirizine (XYZAL) 5 MG tablet Take 1 tablet (5 mg total) by mouth every evening. 30 tablet 0   linaclotide (LINZESS) 72 MCG capsule Take 72 mcg by mouth as needed (IBS).     liraglutide (VICTOZA) 18 MG/3ML SOPN Inject 1.8 mg into the skin daily.     losartan-hydrochlorothiazide (HYZAAR) 100-25 MG tablet Take 1 tablet by mouth daily.     montelukast (SINGULAIR) 10 MG tablet Take 10 mg by mouth at bedtime.      Multiple Vitamin (MULTIVITAMIN) capsule Take 1 capsule by mouth daily.     nitroGLYCERIN (NITROSTAT) 0.4 MG SL tablet DISSOLVE ONE TABLET UNDER THE TONGUE EVERY 5 MINUTES AS NEEDED FOR CHEST PAIN.  DO NOT EXCEED A TOTAL OF 3 DOSES IN 15 MINUTES 25 tablet 5   pantoprazole (PROTONIX) 40 MG tablet Take 1 tablet (40 mg total) by mouth 2 (two) times daily. 60 tablet 6   Potassium Chloride CR (MICRO-K) 8 MEQ CPCR capsule CR Take 8 mEq by mouth daily.     rosuvastatin (CRESTOR) 20 MG tablet Take 1 tablet (20 mg total) by mouth daily. 90 tablet 2   Spacer/Aero-Holding [facility] Use as directed with inhaler. 1 each 1   traZODone (DESYREL) 50 MG tablet Take 50 mg by mouth at bedtime.     No current facility-administered medications for this visit.    Known medication allergies: Allergies  Allergen Reactions   Penicillins Rash    Reaction was 30years ago  Has never taken again Reaction was 30years ago  Has never taken again Has patient had a PCN reaction causing anaphylaxis, immediate rash, facial/tongue/throat swelling, SOB or lightheadedness with hypotension? no Has patient had a PCN reaction causing severe rash involving mucus membranes or skin necrosis?  no Has patient had a PCN reaction that required hospitilization?no  Has patient had a PCN  reaction occurring within the last 10 years?  no If all of the above answers are "no" then may proceed with cephalosporin use   Ampicillin Hives    Tramadol Other (See Comments)    Hallunications       Physical examination: Blood pressure 134/72, pulse 72, resp. rate 16, height 5' 4.5" (1.638 m), weight 161 lb 9.6 oz (73.3 kg), SpO2 98 %.  General: Alert, interactive, in no acute distress. HEENT: PERRLA, TMs pearly gray, turbinates minimally edematous with clear discharge, post-pharynx non erythematous. Neck: Supple without lymphadenopathy. Lungs: Clear to auscultation without wheezing, rhonchi or rales. {no increased work of breathing. CV: Systolic murmur present, normal S1-2 Abdomen: Nondistended, nontender. Skin: Warm and dry, without lesions or rashes. Extremities:  No clubbing, cyanosis or edema. Neuro:   Grossly intact.  Diagnositics/Labs:   Spirometry: FEV1: 1.52 L 88%, FVC: 1.85 L 83%, ratio consistent with nonobstructive pattern  Allergy testing: Deferred due to no histamine response.  Patient took Zyrtec less than 72 hours ago  Assessment and plan:   Allergic rhinitis -stop Zyrtec is not effective -start Xyzal 5mg  daily.  This is a long-acting antihistamine similar to Zyrtec that may be more effective at this time -continue Flonase (fluticasone) 2 sprays each nostril daily for 1-2 weeks at a time before stopping once nasal congestion improves for maximum benefit -start nasal antihistamine Astelin 2 sprays each nostril twice a day for nasal drainage control -continue Singulair 10mg  daily at bedtime -hold Xyzal for at least 3 days or longer prior to skin testing visit for allergy testing  Asthma ?COPD -lung function testing today is normal thus less likely to have COPD as typically baseline lung function is not normal in COPD -stop Symbicort and Spiriva.  Judithann Sauger will replace both of these as is a triple therapy inhaler.  Take Breztri 2 puffs twice a day at this time with spacer device -have access to albuterol inhaler 2 puffs every 4-6 hours as needed for cough/wheeze/shortness of breath/chest tightness.  May use  15-20 minutes prior to activity.   Monitor frequency of use.    Control goals:  Full participation in all desired activities (may need albuterol before activity) Albuterol use two time or less a week on average (not counting use with activity) Cough interfering with sleep two time or less a month Oral steroids no more than once a year No hospitalizations   Follow-up in skin testing visit  I appreciate the opportunity to take part in Amoy's care. Please do not hesitate to contact me with questions.  Sincerely,   Prudy Feeler, MD Allergy/Immunology [facility]

## 2020-11-10 NOTE — Patient Instructions (Addendum)
Allergic rhinitis -stop Zyrtec is not effective -start Xyzal 5mg  daily.  This is a long-acting antihistamine similar to Zyrtec that may be more effective at this time -continue Flonase (fluticasone) 2 sprays each nostril daily for 1-2 weeks at a time before stopping once nasal congestion improves for maximum benefit -start nasal antihistamine Astelin 2 sprays each nostril twice a day for nasal drainage control -continue Singulair 10mg  daily at bedtime -hold Xyzal for at least 3 days or longer prior to skin testing visit for allergy testing  Asthma ?COPD -lung function testing today is normal thus less likely to have COPD as typically baseline lung function is not normal in COPD -stop Symbicort and Spiriva.  Judithann Sauger will replace both of these as is a triple therapy inhaler.  Take Breztri 2 puffs twice a day at this time with spacer device -have access to albuterol inhaler 2 puffs every 4-6 hours as needed for cough/wheeze/shortness of breath/chest tightness.  May use 15-20 minutes prior to activity.   Monitor frequency of use.    Control goals:  Full participation in all desired activities (may need albuterol before activity) Albuterol use two time or less a week on average (not counting use with activity) Cough interfering with sleep two time or less a month Oral steroids no more than once a year No hospitalizations   Follow-up in skin testing visit

## 2020-11-16 ENCOUNTER — Telehealth: Payer: Self-pay | Admitting: Cardiology

## 2020-11-16 NOTE — Telephone Encounter (Signed)
Pt c/o medication issue: 1. Name of Medication: losartan 50 mg was sent Dr. Geraldo Pitter, then Dr Rica Records sent it to the local pharmacy 50/ 12.5 local  2. How are you currently taking this medication (dosage and times per day)? Not sure 3. Are you having a reaction (difficulty breathing--STAT)?  No  4. What is your medication issue? They would like to know which one need to be filled.

## 2020-11-17 NOTE — Telephone Encounter (Signed)
Called patient to inform her of the Losartan dose we have on file. She states her daughter takes care of her medications and she is unsure what dose she is taking, her daughter will call back later to give clarification.

## 2020-11-22 DIAGNOSIS — M4316 Spondylolisthesis, lumbar region: Secondary | ICD-10-CM | POA: Diagnosis not present

## 2020-11-22 DIAGNOSIS — M5459 Other low back pain: Secondary | ICD-10-CM | POA: Diagnosis not present

## 2020-11-23 ENCOUNTER — Encounter: Payer: Self-pay | Admitting: Oncology

## 2020-11-27 ENCOUNTER — Encounter: Payer: Self-pay | Admitting: Gastroenterology

## 2020-11-27 ENCOUNTER — Other Ambulatory Visit: Payer: Self-pay

## 2020-11-27 ENCOUNTER — Ambulatory Visit (AMBULATORY_SURGERY_CENTER): Payer: Medicare HMO | Admitting: Gastroenterology

## 2020-11-27 VITALS — BP 150/87 | HR 74 | Temp 97.1°F | Resp 20 | Ht 64.0 in | Wt 165.0 lb

## 2020-11-27 DIAGNOSIS — D124 Benign neoplasm of descending colon: Secondary | ICD-10-CM | POA: Diagnosis not present

## 2020-11-27 DIAGNOSIS — Z86010 Personal history of colonic polyps: Secondary | ICD-10-CM | POA: Diagnosis not present

## 2020-11-27 DIAGNOSIS — K449 Diaphragmatic hernia without obstruction or gangrene: Secondary | ICD-10-CM

## 2020-11-27 DIAGNOSIS — D123 Benign neoplasm of transverse colon: Secondary | ICD-10-CM | POA: Diagnosis not present

## 2020-11-27 DIAGNOSIS — K2100 Gastro-esophageal reflux disease with esophagitis, without bleeding: Secondary | ICD-10-CM

## 2020-11-27 DIAGNOSIS — D122 Benign neoplasm of ascending colon: Secondary | ICD-10-CM | POA: Diagnosis not present

## 2020-11-27 DIAGNOSIS — R197 Diarrhea, unspecified: Secondary | ICD-10-CM | POA: Diagnosis not present

## 2020-11-27 DIAGNOSIS — K219 Gastro-esophageal reflux disease without esophagitis: Secondary | ICD-10-CM

## 2020-11-27 DIAGNOSIS — K317 Polyp of stomach and duodenum: Secondary | ICD-10-CM | POA: Diagnosis not present

## 2020-11-27 MED ORDER — SODIUM CHLORIDE 0.9 % IV SOLN: 500.0000 mL | Freq: Once | INTRAVENOUS | Status: DC

## 2020-11-27 MED ORDER — SUCRALFATE 1 GM/10ML PO SUSP: 1.0000 g | Freq: Four times a day (QID) | ORAL | 1 refills | Status: DC

## 2020-11-27 MED ORDER — PANTOPRAZOLE SODIUM 40 MG PO TBEC
40.0000 mg | DELAYED_RELEASE_TABLET | Freq: Two times a day (BID) | ORAL | 11 refills | Status: DC
Start: 2020-11-27 — End: 2021-02-08

## 2020-11-27 NOTE — Patient Instructions (Signed)
Impression/Recommendations:  Esophagitis, hiatal hernia, polyp, hemorrhoid, and diverticulosis handouts given to patient.  Resume previous diet. Continue present medications. Await pathology results.  Repeat colonoscopy for surveillance.  Date to be determined after pathology results reviewed.  Resume Plavix (clopidogrel) at prior dose in 2 days.  Protonix 40 mg. By mouth twice daily Carafate 1 gram by mouth 4 times daily for 2 weeks.  YOU HAD AN ENDOSCOPIC PROCEDURE TODAY AT [facility]:   Refer to the procedure report that was given to you for any specific questions about what was found during the examination.  If the procedure report does not answer your questions, please call your gastroenterologist to clarify.  If you requested that your care partner not be given the details of your procedure findings, then the procedure report has been included in a sealed envelope for you to review at your convenience later.  YOU SHOULD EXPECT: Some feelings of bloating in the abdomen. Passage of more gas than usual.  Walking can help get rid of the air that was put into your GI tract during the procedure and reduce the bloating. If you had a lower endoscopy (such as a colonoscopy or flexible sigmoidoscopy) you may notice spotting of blood in your stool or on the toilet paper. If you underwent a bowel prep for your procedure, you may not have a normal bowel movement for a few days.  Please Note:  You might notice some irritation and congestion in your nose or some drainage.  This is from the oxygen used during your procedure.  There is no need for concern and it should clear up in a day or so.  SYMPTOMS TO REPORT IMMEDIATELY:  Following lower endoscopy (colonoscopy or flexible sigmoidoscopy):  Excessive amounts of blood in the stool  Significant tenderness or worsening of abdominal pains  Swelling of the abdomen that is new, acute  Fever of 100F or higher  Following upper  endoscopy (EGD)  Vomiting of blood or coffee ground material  New chest pain or pain under the shoulder blades  Painful or persistently difficult swallowing  New shortness of breath  Fever of 100F or higher  Black, tarry-looking stools  For urgent or emergent issues, a gastroenterologist can be reached at any hour by calling [phone]. Do not use MyChart messaging for urgent concerns.    DIET:  We do recommend a small meal at first, but then you may proceed to your regular diet.  Drink plenty of fluids but you should avoid alcoholic beverages for 24 hours.  ACTIVITY:  You should plan to take it easy for the rest of today and you should NOT DRIVE or use heavy machinery until tomorrow (because of the sedation medicines used during the test).    FOLLOW UP: Our staff will call the number listed on your records 48-72 hours following your procedure to check on you and address any questions or concerns that you may have regarding the information given to you following your procedure. If we do not reach you, we will leave a message.  We will attempt to reach you two times.  During this call, we will ask if you have developed any symptoms of COVID 19. If you develop any symptoms (ie: fever, flu-like symptoms, shortness of breath, cough etc.) before then, please call [phone].  If you test positive for Covid 19 in the 2 weeks post procedure, please call and report this information to [place].    If any biopsies were taken you  will be contacted by phone or by letter within the next 1-3 weeks.  Please call us at [phone] if you have not heard about the biopsies in 3 weeks.    SIGNATURES/CONFIDENTIALITY: You and/or your care partner have signed paperwork which will be entered into your electronic medical record.  These signatures attest to the fact that that the information above on your After Visit Summary has been reviewed and is understood.  Full responsibility of the confidentiality of this  discharge information lies with you and/or your care-partner.

## 2020-11-27 NOTE — Progress Notes (Signed)
Medical history reviewed with no changes noted. VS assessed by N.C 

## 2020-11-27 NOTE — Progress Notes (Signed)
Called to room to assist during endoscopic procedure.  Patient ID and intended procedure confirmed with present staff. Received instructions for my participation in the procedure from the performing physician.  

## 2020-11-27 NOTE — Op Note (Signed)
[facility] Patient Name: Deborah Hutchinson Procedure Date: 11/27/2020 2:05 PM MRN: [id number] Endoscopist: Jackquline Denmark , MD Age: 75 Referring MD:  Date of Birth: Sep 24, 1945 Gender: Female Account #: [id number] Procedure:                Upper GI endoscopy Indications:              #1. GERD with large HH s/p repair Nov 2021 with                            continued heartburn and epigastric pain.                           #2. H/O colonic polyps-tubular adenomas (s/p colon                            12/2018. Next due 12/2021).                           #3. Peritoneal mesothelioma (low grade) at time of                            [facility] repair. Being followed by Dr Clovis Riley                           #4. New onset IDA Medicines:                Monitored Anesthesia Care Procedure:                Pre-Anesthesia Assessment:                           - Prior to the procedure, a History and Physical                            was performed, and patient medications and                            allergies were reviewed. The patient's tolerance of                            previous anesthesia was also reviewed. The risks                            and benefits of the procedure and the sedation                            options and risks were discussed with the patient.                            All questions were answered, and informed consent                            was obtained. Prior Anticoagulants: The patient has  taken Plavix (clopidogrel), last dose was 5 days                            prior to procedure. ASA Grade Assessment: III - A                            patient with severe systemic disease. After                            reviewing the risks and benefits, the patient was                            deemed in satisfactory condition to undergo the                            procedure.                           After obtaining informed consent, the  endoscope was                            passed under direct vision. Throughout the                            procedure, the patient's blood pressure, pulse, and                            oxygen saturations were monitored continuously. The                            GIF [id number] was introduced through the                            mouth, and advanced to the second part of duodenum.                            The upper GI endoscopy was accomplished without                            difficulty. The patient tolerated the procedure                            well. Scope In: Scope Out: Findings:                 LA Grade D (one or more mucosal breaks involving at                            least 75% of esophageal circumference) esophagitis                            was found 28 to 32 cm from the incisors. Biopsies  were taken with a cold forceps for histology.                           A 3 cm hiatal hernia was present. There was                            evidence of previous fundoplication on retroflexed                            examination of the cardia.                           3 10-12 mm semi-sessile polyps with no bleeding and                            no stigmata of recent bleeding were found in the                            gastric body (1 at the diaphragmatic hiatus and the                            other 2 in proximal body of the stomach). Biopsies                            were taken with a cold forceps for histology.                           The examined duodenum was normal. Complications:            No immediate complications. Estimated Blood Loss:     Estimated blood loss: none. Impression:               - LA Grade C reflux esophagitis with no bleeding.                            Biopsied.                           - 3 cm hiatal hernia. S/P fundoplication.                           - Multiple gastric polyps. Biopsied.                            - Normal examined duodenum. Recommendation:           - Patient has a contact number available for                            emergencies. The signs and symptoms of potential                            delayed complications were discussed with the  patient. Return to normal activities tomorrow.                            Written discharge instructions were provided to the                            patient.                           - Resume previous diet.                           - Protonix 40 mg p.o. twice daily #60, 11 refills                           - Carafate 1 g p.o. 4 times daily x 2 weeks.                           - Await pathology results.                           - Resume Plavix (clopidogrel) at prior dose in 2                            days.                           - The findings and recommendations were discussed                            with the patient's family.                           - If continues to be anemic, would recommend EGD                            with polypectomy at [place], MD 11/27/2020 3:02:41 PM This report has been signed electronically.

## 2020-11-27 NOTE — Progress Notes (Signed)
A and O x3. Report to RN. Tolerated MAC anesthesia well.Gums unchanged after procedure. 

## 2020-11-27 NOTE — Op Note (Signed)
[place] Patient Name: Deborah Hutchinson Procedure Date: 11/27/2020 1:58 PM MRN: [id number] Endoscopist: Jackquline Denmark , MD Age: 75 Referring MD:  Date of Birth: 04/23/1946 Gender: Female Account #: [id number] Procedure:                Colonoscopy Indications:              High risk colon cancer surveillance: Personal                            history of colonic polyps Medicines:                Monitored Anesthesia Care Procedure:                Pre-Anesthesia Assessment:                           - Prior to the procedure, a History and Physical                            was performed, and patient medications and                            allergies were reviewed. The patient's tolerance of                            previous anesthesia was also reviewed. The risks                            and benefits of the procedure and the sedation                            options and risks were discussed with the patient.                            All questions were answered, and informed consent                            was obtained. Prior Anticoagulants: The patient has                            taken Plavix (clopidogrel), last dose was 5 days                            prior to procedure. ASA Grade Assessment: III - A                            patient with severe systemic disease. After                            reviewing the risks and benefits, the patient was                            deemed in satisfactory condition to undergo the  procedure.                           After obtaining informed consent, the colonoscope                            was passed under direct vision. Throughout the                            procedure, the patient's blood pressure, pulse, and                            oxygen saturations were monitored continuously. The                            Olympus PCF-H190DL [id number]) Colonoscope was                             introduced through the anus and advanced to the the                            cecum, identified by appendiceal orifice and                            ileocecal valve. The colonoscopy was performed                            without difficulty. The patient tolerated the                            procedure well. The quality of the bowel                            preparation was good. The ileocecal valve,                            appendiceal orifice, and rectum were photographed. Scope In: 2:26:35 PM Scope Out: 2:52:15 PM Scope Withdrawal Time: 0 hours 20 minutes 5 seconds  Total Procedure Duration: 0 hours 25 minutes 40 seconds  Findings:                 Seven sessile polyps were found in the mid                            descending colon, mid transverse colon, proximal                            ascending colon and mid ascending colon. The polyps                            were 6 to 8 mm in size. These polyps were removed                            with a cold snare. Resection and retrieval were  complete.                           A few small-mouthed diverticula were found in the                            sigmoid colon and ascending colon.                           Non-bleeding internal hemorrhoids were found during                            retroflexion. The hemorrhoids were small and Grade                            I (internal hemorrhoids that do not prolapse).                           The exam was otherwise without abnormality on                            direct and retroflexion views. Complications:            No immediate complications. Estimated Blood Loss:     Estimated blood loss: none. Impression:               - Seven 6 to 8 mm polyps in the mid descending                            colon, in the mid transverse colon, in the proximal                            ascending colon and in the mid ascending colon,                            removed  with a cold snare. Resected and retrieved.                           - Diverticulosis in the sigmoid colon and in the                            ascending colon.                           - Non-bleeding internal hemorrhoids.                           - The examination was otherwise normal on direct                            and retroflexion views. Recommendation:           - Patient has a contact number available for                            emergencies. The signs and symptoms of  potential                            delayed complications were discussed with the                            patient. Return to normal activities tomorrow.                            Written discharge instructions were provided to the                            patient.                           - Resume previous diet.                           - Continue present medications.                           - Await pathology results.                           - Repeat colonoscopy for surveillance based on                            pathology results.                           - Resume Plavix (clopidogrel) at prior dose in 2                            days.                           - The findings and recommendations were discussed                            with the patient's family. Jackquline Denmark, MD 11/27/2020 3:05:51 PM This report has been signed electronically.

## 2020-12-01 ENCOUNTER — Other Ambulatory Visit: Payer: Self-pay

## 2020-12-01 ENCOUNTER — Telehealth: Payer: Self-pay

## 2020-12-01 ENCOUNTER — Ambulatory Visit (INDEPENDENT_AMBULATORY_CARE_PROVIDER_SITE_OTHER): Payer: Medicare HMO | Admitting: Allergy

## 2020-12-01 ENCOUNTER — Encounter: Payer: Self-pay | Admitting: Allergy

## 2020-12-01 VITALS — BP 138/86 | HR 70 | Resp 16

## 2020-12-01 DIAGNOSIS — J3089 Other allergic rhinitis: Secondary | ICD-10-CM | POA: Diagnosis not present

## 2020-12-01 MED ORDER — BREZTRI AEROSPHERE 160-9-4.8 MCG/ACT IN AERO
INHALATION_SPRAY | RESPIRATORY_TRACT | 1 refills | Status: DC
Start: 2020-12-01 — End: 2021-02-08

## 2020-12-01 NOTE — Patient Instructions (Signed)
Allergic rhinitis -Environmental allergy testing today is positive to grass pollen, weed pollen, tree pollen, mold, mixed feathers, horse and cockroach. -Allergen avoidance measures discussed/handouts provided  -Resume Xyzal '5mg'$  daily.  This is a long-acting antihistamine similar to Zyrtec that may be more effective at this time -continue Flonase (fluticasone) 2 sprays each nostril daily for 1-2 weeks at a time before stopping once nasal congestion improves for maximum benefit -Continue nasal antihistamine Astelin 2 sprays each nostril twice a day for nasal drainage control -continue Singulair '10mg'$  daily at bedtime  Asthma ?COPD -Continue Breztri 2 puffs twice a day at this time with spacer device -have access to albuterol inhaler 2 puffs every 4-6 hours as needed for cough/wheeze/shortness of breath/chest tightness.  May use 15-20 minutes prior to activity.   Monitor frequency of use.    Control goals:  Full participation in all desired activities (may need albuterol before activity) Albuterol use two time or less a week on average (not counting use with activity) Cough interfering with sleep two time or less a month Oral steroids no more than once a year No hospitalizations   Follow-up in 3 to 4 months or sooner if needed

## 2020-12-01 NOTE — Progress Notes (Signed)
Follow-up Note  RE: Deborah Hutchinson MRN: [id number] DOB: 05-03-1946 Date of Office Visit: 12/01/2020   History of present illness: Deborah Hutchinson is a 75 y.o. female presenting today for skin testing.  She was last seen for her initial evaluation on 11/10/2020 by myself.  At that visit she had not held her antihistamines thus was unable to perform skin testing.  She has been off of Xyzal for the past week for skin testing today.  Medication List: Current Outpatient Medications  Medication Sig Dispense Refill   albuterol (PROVENTIL HFA;VENTOLIN HFA) 108 (90 Base) MCG/ACT inhaler Inhale 2 puffs into the lungs every 6 (six) hours as needed for wheezing or shortness of breath. 3 Inhaler 3   azelastine (ASTELIN) 0.1 % nasal spray Use two sprays in each nostril twice daily as directed for nasal drainage. 30 mL 0   Budeson-Glycopyrrol-Formoterol (BREZTRI AEROSPHERE) 160-9-4.8 MCG/ACT AERO Inhale two puffs using spacer twice daily to prevent cough or wheeze.  Rinse, gargle, and spit after use. 10.7 g 0   buPROPion (WELLBUTRIN XL) 300 MG 24 hr tablet Take 300 mg by mouth daily.      canagliflozin (INVOKANA) 300 MG TABS tablet Take 300 mg by mouth daily.     CELECOXIB PO Take by mouth as needed.     Cholecalciferol (VITAMIN D3) 1.25 MG (50000 UT) CAPS Take 50,000 Units by mouth every 30 (thirty) days.     clopidogrel (PLAVIX) 75 MG tablet Take 1 tablet (75 mg total) by mouth daily. 90 tablet 2   dicyclomine (BENTYL) 10 MG capsule Take 10 mg by mouth 2 (two) times daily as needed for spasms.     donepezil (ARICEPT) 10 MG tablet Take 10 mg by mouth daily at 12 noon.     escitalopram (LEXAPRO) 20 MG tablet Take 20 mg by mouth daily.     famotidine (PEPCID) 20 MG tablet Take 20 mg by mouth in the morning and at bedtime.     fluticasone (FLONASE) 50 MCG/ACT nasal spray Place 2 sprays into both nostrils daily as needed for allergies.     isosorbide mononitrate (IMDUR) 120 MG 24 hr tablet Take  1 tablet by mouth once daily 60 tablet 6   levocetirizine (XYZAL) 5 MG tablet Take 1 tablet (5 mg total) by mouth every evening. 30 tablet 0   liraglutide (VICTOZA) 18 MG/3ML SOPN Inject 1.8 mg into the skin daily.     losartan-hydrochlorothiazide (HYZAAR) 100-25 MG tablet Take 1 tablet by mouth daily.     montelukast (SINGULAIR) 10 MG tablet Take 10 mg by mouth at bedtime.      Multiple Vitamin (MULTIVITAMIN) capsule Take 1 capsule by mouth daily.     pantoprazole (PROTONIX) 40 MG tablet Take 1 tablet (40 mg total) by mouth 2 (two) times daily. 60 tablet 11   Potassium Chloride CR (MICRO-K) 8 MEQ CPCR capsule CR Take 8 mEq by mouth daily.     rosuvastatin (CRESTOR) 20 MG tablet Take 1 tablet (20 mg total) by mouth daily. 90 tablet 2   Spacer/Aero-Holding [facility] Use as directed with inhaler. 1 each 1   sucralfate (CARAFATE) 1 GM/10ML suspension Take 10 mLs (1 g total) by mouth 4 (four) times daily. For 2 weeks 420 mL 1   traZODone (DESYREL) 50 MG tablet Take 50 mg by mouth at bedtime.     No current facility-administered medications for this visit.     Known medication allergies: Allergies  Allergen Reactions  Penicillins Rash    Reaction was 30years ago  Has never taken again Reaction was 30years ago  Has never taken again Has patient had a PCN reaction causing anaphylaxis, immediate rash, facial/tongue/throat swelling, SOB or lightheadedness with hypotension? no Has patient had a PCN reaction causing severe rash involving mucus membranes or skin necrosis?  no Has patient had a PCN reaction that required hospitilization?no  Has patient had a PCN reaction occurring within the last 10 years?  no If all of the above answers are "no" then may proceed with cephalosporin use   Ampicillin Hives   Tramadol Other (See Comments)    Hallunications       Physical examination (limited): Blood pressure 138/86, pulse 70, resp. rate 16, SpO2 98 %.  General: Alert, interactive, in no  acute distress. Skin: Warm and dry, without lesions or rashes. Extremities:  No clubbing, cyanosis or edema.  Diagnositics/Labs:  Allergy testing: Environmental allergy skin prick testing is positive to meadow fescue, lambs quarter, mugwort, birch, sycamore, Fusarium, pullulara, mixed feathers, horse and cockroach. Intradermal testing is negative Allergy testing results were read and interpreted by provider, documented by clinical staff.   Assessment and plan:   Allergic rhinitis -Environmental allergy testing today is positive to grass pollen, weed pollen, tree pollen, mold, mixed feathers, horse and cockroach. -Allergen avoidance measures discussed/handouts provided  -Resume Xyzal '5mg'$  daily.  This is a long-acting antihistamine similar to Zyrtec that may be more effective at this time -continue Flonase (fluticasone) 2 sprays each nostril daily for 1-2 weeks at a time before stopping once nasal congestion improves for maximum benefit -Continue nasal antihistamine Astelin 2 sprays each nostril twice a day for nasal drainage control -continue Singulair '10mg'$  daily at bedtime  Asthma ?COPD -Continue Breztri 2 puffs twice a day at this time with spacer device -have access to albuterol inhaler 2 puffs every 4-6 hours as needed for cough/wheeze/shortness of breath/chest tightness.  May use 15-20 minutes prior to activity.   Monitor frequency of use.    Control goals:  Full participation in all desired activities (may need albuterol before activity) Albuterol use two time or less a week on average (not counting use with activity) Cough interfering with sleep two time or less a month Oral steroids no more than once a year No hospitalizations   Follow-up in 3 to 4 months or sooner if needed  No follow-ups on file.  I appreciate the opportunity to take part in Deborah Hutchinson's care. Please do not hesitate to contact me with questions.  Sincerely,   Prudy Feeler,  MD Allergy/Immunology [facility]

## 2020-12-01 NOTE — Telephone Encounter (Signed)
  Follow up Call-  Call back number 11/27/2020 12/14/2018  Post procedure Call Back phone  # [phone]  Permission to leave phone message Yes Yes  Some recent data might be hidden     Patient questions:  Do you have a fever, pain , or abdominal swelling? No. Pain Score  0 *  Have you tolerated food without any problems? Yes.    Have you been able to return to your normal activities? Yes.    Do you have any questions about your discharge instructions: Diet   No. Medications  No. Follow up visit  No.  Do you have questions or concerns about your Care? Yes.  Pt states she is still having some blood in stool.  Pt states she does have a history of hemorrhoids.  I explained it is most likely from this and the area may still be irritated from the prep.  Encouraged pt to use otc hemorrhoid treatment and to make sure to call office if issue persists or gets worse.  Pt satisfied.  Actions: * If pain score is 4 or above: No action needed, pain <4.

## 2020-12-02 DIAGNOSIS — M8588 Other specified disorders of bone density and structure, other site: Secondary | ICD-10-CM | POA: Diagnosis not present

## 2020-12-02 DIAGNOSIS — N959 Unspecified menopausal and perimenopausal disorder: Secondary | ICD-10-CM | POA: Diagnosis not present

## 2020-12-07 DIAGNOSIS — J309 Allergic rhinitis, unspecified: Secondary | ICD-10-CM | POA: Diagnosis not present

## 2020-12-07 DIAGNOSIS — E785 Hyperlipidemia, unspecified: Secondary | ICD-10-CM | POA: Diagnosis not present

## 2020-12-07 DIAGNOSIS — R413 Other amnesia: Secondary | ICD-10-CM | POA: Diagnosis not present

## 2020-12-07 DIAGNOSIS — I10 Essential (primary) hypertension: Secondary | ICD-10-CM | POA: Diagnosis not present

## 2020-12-07 DIAGNOSIS — I2510 Atherosclerotic heart disease of native coronary artery without angina pectoris: Secondary | ICD-10-CM | POA: Diagnosis not present

## 2020-12-07 DIAGNOSIS — K219 Gastro-esophageal reflux disease without esophagitis: Secondary | ICD-10-CM | POA: Diagnosis not present

## 2020-12-09 ENCOUNTER — Encounter: Payer: Self-pay | Admitting: Gastroenterology

## 2020-12-09 ENCOUNTER — Other Ambulatory Visit: Payer: Self-pay | Admitting: *Deleted

## 2020-12-09 MED ORDER — FLUTICASONE PROPIONATE 50 MCG/ACT NA SUSP: NASAL | 5 refills | Status: DC

## 2020-12-09 MED ORDER — AZELASTINE HCL 0.1 % NA SOLN: NASAL | 5 refills | Status: DC

## 2020-12-15 DIAGNOSIS — M4316 Spondylolisthesis, lumbar region: Secondary | ICD-10-CM | POA: Diagnosis not present

## 2020-12-18 ENCOUNTER — Other Ambulatory Visit: Payer: Self-pay | Admitting: Allergy

## 2020-12-18 DIAGNOSIS — G9519 Other vascular myelopathies: Secondary | ICD-10-CM | POA: Insufficient documentation

## 2020-12-18 DIAGNOSIS — R29818 Other symptoms and signs involving the nervous system: Secondary | ICD-10-CM

## 2020-12-18 DIAGNOSIS — M5416 Radiculopathy, lumbar region: Secondary | ICD-10-CM

## 2020-12-18 DIAGNOSIS — Z981 Arthrodesis status: Secondary | ICD-10-CM | POA: Diagnosis not present

## 2020-12-18 DIAGNOSIS — M5116 Intervertebral disc disorders with radiculopathy, lumbar region: Secondary | ICD-10-CM | POA: Diagnosis not present

## 2020-12-18 DIAGNOSIS — M160 Bilateral primary osteoarthritis of hip: Secondary | ICD-10-CM | POA: Diagnosis not present

## 2020-12-18 HISTORY — DX: Other symptoms and signs involving the nervous system: R29.818

## 2020-12-18 HISTORY — DX: Radiculopathy, lumbar region: M54.16

## 2020-12-21 ENCOUNTER — Telehealth: Payer: Self-pay | Admitting: Gastroenterology

## 2020-12-21 ENCOUNTER — Other Ambulatory Visit: Payer: Self-pay | Admitting: *Deleted

## 2020-12-21 ENCOUNTER — Telehealth: Payer: Medicare HMO | Admitting: Allergy

## 2020-12-21 MED ORDER — EPINEPHRINE 0.3 MG/0.3ML IJ SOAJ: 0.3000 mg | Freq: Once | INTRAMUSCULAR | 1 refills | Status: AC

## 2020-12-21 NOTE — Telephone Encounter (Signed)
Called and spoke with the patient and she stated that her breathing has been doing well. Explained the process to the patient and the number of vials would be ordered and how long she would need to come in weekly for her allergy injections and how long she could be getting her allergy injections for. Patient verbalized understanding. She has been scheduled to start allergy injections and an EpiPen has been sent in to her pharmacy. Patient verbalized understanding and knows to bring her EpiPen with her to her appointment.

## 2020-12-21 NOTE — Telephone Encounter (Signed)
Dr. Nelva Bush would you be ok with the patient starting Allergy Injections? If so I will call the patient and review everything for starting injections. Thank You.

## 2020-12-21 NOTE — Telephone Encounter (Signed)
Patient states her medication is not working for her and is interested in starting allergy injections, if possible. She would like to speak with a nurse about starting this process and what she would need to do.

## 2020-12-21 NOTE — Telephone Encounter (Signed)
Inbound call from patient. Wants to discuss the medication previous put on if she should continue to take it. Sucralfate and pantoprazole

## 2020-12-21 NOTE — Telephone Encounter (Signed)
Patient was told she could stop carafate since it was for 2 week but the protonix she has to continue. She said she is doing fine

## 2020-12-23 DIAGNOSIS — M5459 Other low back pain: Secondary | ICD-10-CM | POA: Diagnosis not present

## 2020-12-23 DIAGNOSIS — M4316 Spondylolisthesis, lumbar region: Secondary | ICD-10-CM | POA: Diagnosis not present

## 2020-12-26 DIAGNOSIS — Z981 Arthrodesis status: Secondary | ICD-10-CM | POA: Diagnosis not present

## 2020-12-26 DIAGNOSIS — M5116 Intervertebral disc disorders with radiculopathy, lumbar region: Secondary | ICD-10-CM | POA: Diagnosis not present

## 2020-12-26 DIAGNOSIS — Z8679 Personal history of other diseases of the circulatory system: Secondary | ICD-10-CM | POA: Diagnosis not present

## 2020-12-26 DIAGNOSIS — M48061 Spinal stenosis, lumbar region without neurogenic claudication: Secondary | ICD-10-CM | POA: Diagnosis not present

## 2020-12-26 DIAGNOSIS — M5117 Intervertebral disc disorders with radiculopathy, lumbosacral region: Secondary | ICD-10-CM | POA: Diagnosis not present

## 2020-12-26 DIAGNOSIS — M48062 Spinal stenosis, lumbar region with neurogenic claudication: Secondary | ICD-10-CM | POA: Diagnosis not present

## 2020-12-26 DIAGNOSIS — M5416 Radiculopathy, lumbar region: Secondary | ICD-10-CM | POA: Diagnosis not present

## 2020-12-28 DIAGNOSIS — R413 Other amnesia: Secondary | ICD-10-CM | POA: Diagnosis not present

## 2020-12-28 DIAGNOSIS — I10 Essential (primary) hypertension: Secondary | ICD-10-CM | POA: Diagnosis not present

## 2020-12-28 DIAGNOSIS — J309 Allergic rhinitis, unspecified: Secondary | ICD-10-CM | POA: Diagnosis not present

## 2020-12-28 DIAGNOSIS — K219 Gastro-esophageal reflux disease without esophagitis: Secondary | ICD-10-CM | POA: Diagnosis not present

## 2020-12-29 NOTE — Addendum Note (Signed)
Addended by: Theresia Lo on: 12/29/2020 05:02 PM   Modules accepted: Orders

## 2020-12-30 NOTE — Progress Notes (Signed)
Aeroallergen Immunotherapy   Ordering Provider: Dr. Prudy Feeler   Patient Details  Name: Deborah Hutchinson  MRN: [id number]  Date of Birth: 03/21/1946   Order 2 of 2   Vial Label: mold, roach   0.2 ml (Volume)  1:10 Concentration -- Fusarium moniliforme  0.2 ml (Volume)  1:40 Concentration -- Aureobasidium pullulans  0.3 ml (Volume)  1:20 Concentration -- Cockroach, German    0.7  ml Extract Subtotal  4.3  ml Diluent  5.0  ml Maintenance Total   Schedule:  B   Blue Vial (1:100,000): Schedule B (6 doses)  Yellow Vial (1:10,000): Schedule B (6 doses)  Green Vial (1:1,000): Schedule B (6 doses)  Red Vial (1:100): Schedule A (10 doses)   Special Instructions: 1-2 injections per week

## 2020-12-30 NOTE — Progress Notes (Signed)
VIALS MADE. EXP 12-30-21

## 2020-12-30 NOTE — Progress Notes (Signed)
Aeroallergen Immunotherapy   Ordering Provider: Dr. Prudy Feeler   Patient Details  Name: Deborah Hutchinson  MRN: [id number]  Date of Birth: 10-18-1945   Order 1 of 2   Vial Label: pollen   0.3 ml (Volume)  BAU Concentration -- 7 Grass Mix* 100,000 ([address] [place], [place], [place], Perennial [place], Red[place], Sweet Vernal, Timothy)  0.5 ml (Volume)  1:20 Concentration -- Weed Mix*  0.5 ml (Volume)  1:20 Concentration -- Eastern 10 Tree Mix (also Sweet Gum)    1.3  ml Extract Subtotal  3.7  ml Diluent  5.0  ml Maintenance Total   Schedule:  B  Blue Vial (1:100,000): Schedule B (6 doses)  Yellow Vial (1:10,000): Schedule B (6 doses)  Green Vial (1:1,000): Schedule B (6 doses)  Red Vial (1:100): Schedule A (10 doses)   Special Instructions: 1-2 injections per week

## 2020-12-31 DIAGNOSIS — I10 Essential (primary) hypertension: Secondary | ICD-10-CM | POA: Diagnosis not present

## 2020-12-31 DIAGNOSIS — M545 Low back pain, unspecified: Secondary | ICD-10-CM | POA: Diagnosis not present

## 2020-12-31 DIAGNOSIS — N3281 Overactive bladder: Secondary | ICD-10-CM | POA: Diagnosis not present

## 2020-12-31 DIAGNOSIS — M159 Polyosteoarthritis, unspecified: Secondary | ICD-10-CM | POA: Diagnosis not present

## 2020-12-31 DIAGNOSIS — Z6828 Body mass index (BMI) 28.0-28.9, adult: Secondary | ICD-10-CM | POA: Diagnosis not present

## 2020-12-31 DIAGNOSIS — J301 Allergic rhinitis due to pollen: Secondary | ICD-10-CM | POA: Diagnosis not present

## 2020-12-31 DIAGNOSIS — M25473 Effusion, unspecified ankle: Secondary | ICD-10-CM | POA: Diagnosis not present

## 2021-01-01 DIAGNOSIS — J301 Allergic rhinitis due to pollen: Secondary | ICD-10-CM | POA: Diagnosis not present

## 2021-01-04 ENCOUNTER — Other Ambulatory Visit: Payer: Self-pay | Admitting: Hematology and Oncology

## 2021-01-04 DIAGNOSIS — D508 Other iron deficiency anemias: Secondary | ICD-10-CM

## 2021-01-05 NOTE — Progress Notes (Signed)
[facility]  [address] [place],  [place]  [address] [phone]  Clinic Day:  01/13/2021  Referring physician: [name], MD  This document serves as a record of services personally performed by Marice Potter, MD. It was created on their behalf by [facility], a trained medical scribe. The creation of this record is based on the scribe's personal observations and the provider's statements to them.  HISTORY OF PRESENT ILLNESS:  The patient is a 75 y.o. female with recurrent iron deficiency anemia.  She comes in today to reassess her iron and hemoglobin levels after receiving a recent course of IV iron.  Despite receiving IV iron recently, she continues to complain of feeing weak and cold.  She denies having any overt forms of blood loss.  Of note, the patient has been diagnosed with peritoneal mesothelioma, which was incidentally found as she was undergoing hiatal hernia surgery.  As pathology showed it to be low grade, she is being followed conservatively by Dr Clovis Riley at [facility], who is seeing her on annual basis.     VITALS:  Blood pressure (!) 191/82, pulse 74, temperature 97.7 F (36.5 C), resp. rate 16, height 5' 4.5" (1.638 m), weight 171 lb 8 oz (77.8 kg), SpO2 97 %.  Wt Readings from Last 3 Encounters:  01/13/21 171 lb 8 oz (77.8 kg)  11/27/20 165 lb (74.8 kg)  11/10/20 161 lb 9.6 oz (73.3 kg)    Body mass index is 28.98 kg/m.  Performance status (ECOG): 1  PHYSICAL EXAM:  Physical Exam Constitutional:      General: She is not in acute distress.    Appearance: Normal appearance. She is normal weight.  HENT:     Head: Normocephalic and atraumatic.  Eyes:     General: No scleral icterus.    Extraocular Movements: Extraocular movements intact.     Conjunctiva/sclera: Conjunctivae normal.     Pupils: Pupils are equal, round, and reactive to light.  Cardiovascular:     Rate and Rhythm: Normal rate and regular  rhythm.     Pulses: Normal pulses.     Heart sounds: Normal heart sounds. No murmur heard.   No friction rub. No gallop.  Pulmonary:     Effort: Pulmonary effort is normal. No respiratory distress.     Breath sounds: Normal breath sounds.  Abdominal:     General: Bowel sounds are normal. There is no distension.     Palpations: Abdomen is soft. There is no hepatomegaly, splenomegaly or mass.     Tenderness: There is no abdominal tenderness.  Musculoskeletal:        General: Normal range of motion.     Cervical back: Normal range of motion and neck supple.     Right lower leg: No edema.     Left lower leg: No edema.  Lymphadenopathy:     Cervical: No cervical adenopathy.  Skin:    General: Skin is warm and dry.  Neurological:     General: No focal deficit present.     Mental Status: She is alert and oriented to person, place, and time. Mental status is at baseline.  Psychiatric:        Mood and Affect: Mood normal.        Behavior: Behavior normal.        Thought Content: Thought content normal.        Judgment: Judgment normal.    LABS:    Ref.  Range 01/13/2021 00:00  WBC Unknown 8.1  RBC Latest Ref Range: 3.87 - 5.11  4.05  Hemoglobin Latest Ref Range: 12.0 - 16.0  10.8 (A)  HCT Latest Ref Range: 36 - 46  34 (A)  Platelets Latest Ref Range: 150 - 399  444 (A)  NEUT# Unknown 5.27    Ref. Range 01/13/2021 11:11  Iron Latest Ref Range: 28 - 170 ug/dL 27 (L)  UIBC Latest Units: ug/dL 488  TIBC Latest Ref Range: 250 - 450 ug/dL 515 (H)  Saturation Ratios Latest Ref Range: 10.4 - 31.8 % 5 (L)  Ferritin Latest Ref Range: 11 - 307 ng/mL 7 (L)     ASSESSMENT & PLAN:  Assessment/Plan:  A 75 year old woman with recurrent iron deficiency anemia.  Although her hemoglobin is better since receiving IV Feraheme, her hemoglobin still is suboptimal.  Furthermore, her iron parameters remain strikingly low.  Based upon this, she will receive another course of IV iron in the forthcoming  weeks.  She also  knows to begin taking ferrous sulfate twice daily.  I will see her back in 4 months for repeat clinical assessment. The patient understands all the plans discussed today and is in agreement with them.  I, Rita Ohara, am acting as scribe for Marice Potter, MD    I have reviewed this report as typed by the medical scribe, and it is complete and accurate.  Jazzmin Newbold Macarthur Critchley, MD

## 2021-01-06 DIAGNOSIS — J301 Allergic rhinitis due to pollen: Secondary | ICD-10-CM | POA: Diagnosis not present

## 2021-01-06 DIAGNOSIS — J4540 Moderate persistent asthma, uncomplicated: Secondary | ICD-10-CM | POA: Diagnosis not present

## 2021-01-06 DIAGNOSIS — G4733 Obstructive sleep apnea (adult) (pediatric): Secondary | ICD-10-CM | POA: Diagnosis not present

## 2021-01-06 DIAGNOSIS — R5383 Other fatigue: Secondary | ICD-10-CM | POA: Diagnosis not present

## 2021-01-06 DIAGNOSIS — Z8616 Personal history of COVID-19: Secondary | ICD-10-CM | POA: Diagnosis not present

## 2021-01-12 ENCOUNTER — Ambulatory Visit: Payer: Medicare HMO

## 2021-01-13 ENCOUNTER — Other Ambulatory Visit: Payer: Self-pay | Admitting: Hematology and Oncology

## 2021-01-13 ENCOUNTER — Telehealth: Payer: Self-pay | Admitting: Oncology

## 2021-01-13 ENCOUNTER — Inpatient Hospital Stay: Payer: Medicare HMO

## 2021-01-13 ENCOUNTER — Inpatient Hospital Stay: Payer: Medicare HMO | Attending: Oncology | Admitting: Oncology

## 2021-01-13 VITALS — BP 191/82 | HR 74 | Temp 97.7°F | Resp 16 | Ht 64.5 in | Wt 171.5 lb

## 2021-01-13 DIAGNOSIS — D509 Iron deficiency anemia, unspecified: Secondary | ICD-10-CM | POA: Insufficient documentation

## 2021-01-13 DIAGNOSIS — D508 Other iron deficiency anemias: Secondary | ICD-10-CM | POA: Diagnosis not present

## 2021-01-13 LAB — CBC AND DIFFERENTIAL
HCT: 34 — AB (ref 36–46)
Hemoglobin: 10.8 — AB (ref 12.0–16.0)
Neutrophils Absolute: 5.27
Platelets: 444 — AB (ref 150–399)
WBC: 8.1

## 2021-01-13 LAB — IRON AND TIBC
Iron: 27 ug/dL — ABNORMAL LOW (ref 28–170)
Saturation Ratios: 5 % — ABNORMAL LOW (ref 10.4–31.8)
TIBC: 515 ug/dL — ABNORMAL HIGH (ref 250–450)
UIBC: 488 ug/dL

## 2021-01-13 LAB — FERRITIN: Ferritin: 7 ng/mL — ABNORMAL LOW (ref 11–307)

## 2021-01-13 LAB — CBC: RBC: 4.05 (ref 3.87–5.11)

## 2021-01-13 NOTE — Telephone Encounter (Signed)
Per 9/7 LOS, patient scheduled for Jan 2023 Appt's.  Gave patient Appt Summary.  Mailed Appt

## 2021-01-14 ENCOUNTER — Encounter: Payer: Self-pay | Admitting: Oncology

## 2021-01-14 ENCOUNTER — Telehealth: Payer: Self-pay

## 2021-01-14 DIAGNOSIS — M7060 Trochanteric bursitis, unspecified hip: Secondary | ICD-10-CM | POA: Diagnosis not present

## 2021-01-14 DIAGNOSIS — M545 Low back pain, unspecified: Secondary | ICD-10-CM | POA: Diagnosis not present

## 2021-01-14 DIAGNOSIS — Z981 Arthrodesis status: Secondary | ICD-10-CM | POA: Diagnosis not present

## 2021-01-14 NOTE — Telephone Encounter (Addendum)
01/14/21 @ 1018 - I notified pt of Dr Bobby Rumpf' recommendations below. She is in agreement. I told her we would get insurance approval then schedulers would call her with appt's. Pt verbalized understanding.  01/14/21- Dr Bobby Rumpf reviewed pt's labs. He recommends another course of IV iron. I have attempted 2 calls to pt, no answer & voicemail box not setup.

## 2021-01-14 NOTE — Addendum Note (Signed)
Addended by: Juanetta Beets on: 01/14/2021 04:04 PM   Modules accepted: Orders

## 2021-01-15 ENCOUNTER — Other Ambulatory Visit: Payer: Self-pay | Admitting: Pharmacist

## 2021-01-15 ENCOUNTER — Telehealth: Payer: Self-pay | Admitting: Oncology

## 2021-01-15 MED FILL — Ferumoxytol Inj 510 MG/17ML (30 MG/ML) (Elemental Fe): INTRAVENOUS | Qty: 17 | Status: AC

## 2021-01-15 NOTE — Telephone Encounter (Signed)
01/15/21 Spoke with patient and scheduled IV IRON

## 2021-01-18 ENCOUNTER — Ambulatory Visit: Payer: Medicare HMO

## 2021-01-18 ENCOUNTER — Other Ambulatory Visit: Payer: Self-pay

## 2021-01-18 ENCOUNTER — Inpatient Hospital Stay: Payer: Medicare HMO

## 2021-01-18 VITALS — BP 158/72 | HR 78 | Resp 18 | Ht 64.5 in | Wt 169.5 lb

## 2021-01-18 DIAGNOSIS — D508 Other iron deficiency anemias: Secondary | ICD-10-CM

## 2021-01-18 DIAGNOSIS — D509 Iron deficiency anemia, unspecified: Secondary | ICD-10-CM | POA: Diagnosis not present

## 2021-01-18 DIAGNOSIS — M65341 Trigger finger, right ring finger: Secondary | ICD-10-CM | POA: Diagnosis not present

## 2021-01-18 MED ORDER — SODIUM CHLORIDE 0.9% FLUSH
10.0000 mL | Freq: Once | INTRAVENOUS | Status: AC | PRN
  Administered 2021-01-18: 10 mL

## 2021-01-18 MED ORDER — SODIUM CHLORIDE 0.9 % IV SOLN
510.0000 mg | Freq: Once | INTRAVENOUS | Status: AC
  Administered 2021-01-18: 510 mg via INTRAVENOUS
  Filled 2021-01-18: qty 17

## 2021-01-18 MED ORDER — SODIUM CHLORIDE 0.9 % IV SOLN: Freq: Once | INTRAVENOUS | Status: AC

## 2021-01-18 NOTE — Progress Notes (Signed)
1247: PT STABLE AT TIME OF DISCHARGE  

## 2021-01-18 NOTE — Patient Instructions (Signed)
Ferumoxytol Injection What is this medication? FERUMOXYTOL (FER ue MOX i tol) treats low levels of iron in your body (iron deficiency anemia). Iron is a mineral that plays an important role in making red blood cells, which carry oxygen from your lungs to the rest of your body. This medicine may be used for other purposes; ask your health care provider or pharmacist if you have questions. COMMON BRAND NAME(S): Feraheme What should I tell my care team before I take this medication? They need to know if you have any of these conditions: Anemia not caused by low iron levels High levels of iron in the blood Magnetic resonance imaging (MRI) test scheduled An unusual or allergic reaction to iron, other medications, foods, dyes, or preservatives Pregnant or trying to get pregnant Breast-feeding How should I use this medication? This medication is for injection into a vein. It is given in a hospital or clinic setting. Talk to your care team the use of this medication in children. Special care may be needed. Overdosage: If you think you have taken too much of this medicine contact a poison control center or emergency room at once. NOTE: This medicine is only for you. Do not share this medicine with others. What if I miss a dose? It is important not to miss your dose. Call your care team if you are unable to keep an appointment. What may interact with this medication? Other iron products This list may not describe all possible interactions. Give your health care provider a list of all the medicines, herbs, non-prescription drugs, or dietary supplements you use. Also tell them if you smoke, drink alcohol, or use illegal drugs. Some items may interact with your medicine. What should I watch for while using this medication? Visit your care team regularly. Tell your care team if your symptoms do not start to get better or if they get worse. You may need blood work done while you are taking this  medication. You may need to follow a special diet. Talk to your care team. Foods that contain iron include: whole grains/cereals, dried fruits, beans, or peas, leafy green vegetables, and organ meats (liver, kidney). What side effects may I notice from receiving this medication? Side effects that you should report to your care team as soon as possible: Allergic reactions-skin rash, itching, hives, swelling of the face, lips, tongue, or throat Low blood pressure-dizziness, feeling faint or lightheaded, blurry vision Shortness of breath Side effects that usually do not require medical attention (report to your care team if they continue or are bothersome): Flushing Headache Joint pain Muscle pain Nausea Pain, redness, or irritation at injection site This list may not describe all possible side effects. Call your doctor for medical advice about side effects. You may report side effects to FDA at 1-800-FDA-1088. Where should I keep my medication? This medication is given in a hospital or clinic and will not be stored at home. NOTE: This sheet is a summary. It may not cover all possible information. If you have questions about this medicine, talk to your doctor, pharmacist, or health care provider.  2022 Elsevier/Gold Standard (2020-09-11 15:35:12)  

## 2021-01-19 DIAGNOSIS — Z8616 Personal history of COVID-19: Secondary | ICD-10-CM | POA: Diagnosis not present

## 2021-01-19 DIAGNOSIS — R5383 Other fatigue: Secondary | ICD-10-CM | POA: Diagnosis not present

## 2021-01-19 DIAGNOSIS — J301 Allergic rhinitis due to pollen: Secondary | ICD-10-CM | POA: Diagnosis not present

## 2021-01-19 DIAGNOSIS — J4540 Moderate persistent asthma, uncomplicated: Secondary | ICD-10-CM | POA: Diagnosis not present

## 2021-01-19 DIAGNOSIS — G4733 Obstructive sleep apnea (adult) (pediatric): Secondary | ICD-10-CM | POA: Diagnosis not present

## 2021-01-20 ENCOUNTER — Encounter: Payer: Self-pay | Admitting: Oncology

## 2021-01-20 ENCOUNTER — Other Ambulatory Visit: Payer: Self-pay | Admitting: Neurology

## 2021-01-20 ENCOUNTER — Other Ambulatory Visit: Payer: Self-pay | Admitting: Gastroenterology

## 2021-01-20 DIAGNOSIS — H9313 Tinnitus, bilateral: Secondary | ICD-10-CM

## 2021-01-20 DIAGNOSIS — R519 Headache, unspecified: Secondary | ICD-10-CM

## 2021-01-20 MED FILL — Ferumoxytol Inj 510 MG/17ML (30 MG/ML) (Elemental Fe): INTRAVENOUS | Qty: 17 | Status: AC

## 2021-01-21 ENCOUNTER — Other Ambulatory Visit: Payer: Self-pay

## 2021-01-21 ENCOUNTER — Inpatient Hospital Stay: Payer: Medicare HMO

## 2021-01-21 VITALS — BP 131/71 | HR 77 | Temp 98.2°F | Resp 18 | Ht 64.5 in | Wt 169.0 lb

## 2021-01-21 DIAGNOSIS — D508 Other iron deficiency anemias: Secondary | ICD-10-CM

## 2021-01-21 DIAGNOSIS — D509 Iron deficiency anemia, unspecified: Secondary | ICD-10-CM | POA: Diagnosis not present

## 2021-01-21 MED ORDER — SODIUM CHLORIDE 0.9 % IV SOLN: Freq: Once | INTRAVENOUS | Status: AC

## 2021-01-21 MED ORDER — SODIUM CHLORIDE 0.9 % IV SOLN
510.0000 mg | Freq: Once | INTRAVENOUS | Status: AC
  Administered 2021-01-21: 510 mg via INTRAVENOUS
  Filled 2021-01-21: qty 17

## 2021-01-21 NOTE — Patient Instructions (Signed)
Ferumoxytol Injection What is this medication? FERUMOXYTOL (FER ue MOX i tol) treats low levels of iron in your body (iron deficiency anemia). Iron is a mineral that plays an important role in making red blood cells, which carry oxygen from your lungs to the rest of your body. This medicine may be used for other purposes; ask your health care provider or pharmacist if you have questions. COMMON BRAND NAME(S): Feraheme What should I tell my care team before I take this medication? They need to know if you have any of these conditions: Anemia not caused by low iron levels High levels of iron in the blood Magnetic resonance imaging (MRI) test scheduled An unusual or allergic reaction to iron, other medications, foods, dyes, or preservatives Pregnant or trying to get pregnant Breast-feeding How should I use this medication? This medication is for injection into a vein. It is given in a hospital or clinic setting. Talk to your care team the use of this medication in children. Special care may be needed. Overdosage: If you think you have taken too much of this medicine contact a poison control center or emergency room at once. NOTE: This medicine is only for you. Do not share this medicine with others. What if I miss a dose? It is important not to miss your dose. Call your care team if you are unable to keep an appointment. What may interact with this medication? Other iron products This list may not describe all possible interactions. Give your health care provider a list of all the medicines, herbs, non-prescription drugs, or dietary supplements you use. Also tell them if you smoke, drink alcohol, or use illegal drugs. Some items may interact with your medicine. What should I watch for while using this medication? Visit your care team regularly. Tell your care team if your symptoms do not start to get better or if they get worse. You may need blood work done while you are taking this  medication. You may need to follow a special diet. Talk to your care team. Foods that contain iron include: whole grains/cereals, dried fruits, beans, or peas, leafy green vegetables, and organ meats (liver, kidney). What side effects may I notice from receiving this medication? Side effects that you should report to your care team as soon as possible: Allergic reactions-skin rash, itching, hives, swelling of the face, lips, tongue, or throat Low blood pressure-dizziness, feeling faint or lightheaded, blurry vision Shortness of breath Side effects that usually do not require medical attention (report to your care team if they continue or are bothersome): Flushing Headache Joint pain Muscle pain Nausea Pain, redness, or irritation at injection site This list may not describe all possible side effects. Call your doctor for medical advice about side effects. You may report side effects to FDA at 1-800-FDA-1088. Where should I keep my medication? This medication is given in a hospital or clinic and will not be stored at home. NOTE: This sheet is a summary. It may not cover all possible information. If you have questions about this medicine, talk to your doctor, pharmacist, or health care provider.  2022 Elsevier/Gold Standard (2020-09-11 15:35:12)  

## 2021-01-21 NOTE — Progress Notes (Signed)
FEMORAL SHEATHS AND AIRWAYS DESCRIBED ON LDA AVATAR WERE NOT PRESENT TODAY ON ASSESSMENT.

## 2021-01-22 ENCOUNTER — Ambulatory Visit: Payer: Medicare HMO

## 2021-01-22 ENCOUNTER — Other Ambulatory Visit: Payer: Self-pay | Admitting: Allergy

## 2021-01-23 DIAGNOSIS — M5459 Other low back pain: Secondary | ICD-10-CM | POA: Diagnosis not present

## 2021-01-23 DIAGNOSIS — M4316 Spondylolisthesis, lumbar region: Secondary | ICD-10-CM | POA: Diagnosis not present

## 2021-01-25 ENCOUNTER — Telehealth: Payer: Self-pay

## 2021-01-25 ENCOUNTER — Inpatient Hospital Stay: Payer: Medicare HMO

## 2021-01-25 NOTE — Telephone Encounter (Signed)
   [facility] Pre-operative Risk Assessment    Patient Name: Deborah Hutchinson  DOB: 14-Oct-1945 MRN: [id number]  HEARTCARE STAFF:  - IMPORTANT!!!!!! Under Visit Info/Reason for Call, type in Other and utilize the format Clearance MM/DD/YY or Clearance TBD. Do not use dashes or single digits. - Please review there is not already an duplicate clearance open for this procedure. - If request is for dental extraction, please clarify the # of teeth to be extracted. - If the patient is currently at the dentist's office, call Pre-Op Callback Staff (MA/nurse) to input urgent request.  - If the patient is not currently in the dentist office, please route to the Pre-Op pool.  Request for surgical clearance:  What type of surgery is being performed? Right ring finger trigger release   When is this surgery scheduled? TBD  What type of clearance is required (medical clearance vs. Pharmacy clearance to hold med vs. Both)? Medical  Are there any medications that need to be held prior to surgery and how long?   Practice name and name of physician performing surgery? [facility]- Dr. Joya Salm  What is the office phone number? [phone]   7.   What is the office fax number? [phone]  8.   Anesthesia type (None, local, MAC, general) ? Light sedation with nerve block   Lowella Grip 01/25/2021, 4:29 PM  _________________________________________________________________   (provider comments below)

## 2021-01-25 NOTE — Telephone Encounter (Signed)
   Primary Cardiologist: [name], MD  Chart reviewed as part of pre-operative protocol coverage. Given past medical history and time since last visit, based on ACC/AHA guidelines, Jalilah Hawken would be at acceptable risk for the planned procedure without further cardiovascular testing.   Patient was advised that if he develops new symptoms prior to surgery to contact our office to arrange a follow-up appointment.  He verbalized understanding.  I will route this recommendation to the requesting party via Epic fax function and remove from pre-op pool.  Please call with questions.  [name] [place] Gustafson NP-C    01/25/2021, 4:55 PM [facility] [address] Office [phone] Fax [phone]

## 2021-01-26 ENCOUNTER — Telehealth: Payer: Self-pay | Admitting: Cardiology

## 2021-01-26 NOTE — Telephone Encounter (Signed)
Follow up:     Sharron from [employer] how many days patient  need to stop taking medication. Please advise.

## 2021-01-26 NOTE — Telephone Encounter (Addendum)
   Patient Name: Deborah Hutchinson  DOB: [birth year] MRN: [id number]  Primary Cardiologist: [name], MD  Chart revisited as part of pre-operative protocol coverage. Coletta Memos NP reviewed clearance yesterday. There were no medications listed to hold on her clearance form. However, patient is noted to be on Plavix. This appears to be filled by the neurology team for history of cavernous carotid stenosis. She is not on this for cardiac reasons otherwise. Would recommend surgical team reach out to prescriber of this medication to discuss clearance for holding. Will route to callback team to return call with this recommendation.  Charlie Pitter, PA-C 01/26/2021, 5:01 PM

## 2021-01-26 NOTE — Telephone Encounter (Signed)
Notes have been faxed to requesting office with recommendations to call neurology in regards to Plavix.

## 2021-01-28 DIAGNOSIS — J301 Allergic rhinitis due to pollen: Secondary | ICD-10-CM | POA: Diagnosis not present

## 2021-02-06 DIAGNOSIS — M109 Gout, unspecified: Secondary | ICD-10-CM | POA: Diagnosis not present

## 2021-02-08 ENCOUNTER — Ambulatory Visit (INDEPENDENT_AMBULATORY_CARE_PROVIDER_SITE_OTHER): Payer: Medicare HMO | Admitting: Gastroenterology

## 2021-02-08 ENCOUNTER — Other Ambulatory Visit: Payer: Self-pay

## 2021-02-08 VITALS — BP 126/82 | HR 67 | Ht 64.5 in | Wt 170.0 lb

## 2021-02-08 DIAGNOSIS — K219 Gastro-esophageal reflux disease without esophagitis: Secondary | ICD-10-CM | POA: Diagnosis not present

## 2021-02-08 DIAGNOSIS — K449 Diaphragmatic hernia without obstruction or gangrene: Secondary | ICD-10-CM

## 2021-02-08 DIAGNOSIS — C451 Mesothelioma of peritoneum: Secondary | ICD-10-CM

## 2021-02-08 DIAGNOSIS — D509 Iron deficiency anemia, unspecified: Secondary | ICD-10-CM | POA: Diagnosis not present

## 2021-02-08 DIAGNOSIS — J301 Allergic rhinitis due to pollen: Secondary | ICD-10-CM | POA: Diagnosis not present

## 2021-02-08 MED ORDER — PANTOPRAZOLE SODIUM 40 MG PO TBEC: 40.0000 mg | DELAYED_RELEASE_TABLET | Freq: Every day | ORAL | 4 refills | Status: DC

## 2021-02-08 NOTE — Patient Instructions (Addendum)
If you are age 77 or older, your body mass index should be between 23-30. Your Body mass index is 28.73 kg/m. If this is out of the aforementioned range listed, please consider follow up with your Primary Care Provider.  If you are age 71 or younger, your body mass index should be between 19-25. Your Body mass index is 28.73 kg/m. If this is out of the aformentioned range listed, please consider follow up with your Primary Care Provider.   __________________________________________________________  The [facility] providers would like to encourage you to use [facility] to communicate with providers for non-urgent requests or questions.  Due to long hold times on the telephone, sending your provider a message by [facility] may be a faster and more efficient way to get a response.  Please allow 48 business hours for a response.  Please remember that this is for non-urgent requests.   We will give you a call regarding your EGD at [facility]. If you haven't heard anything from [place] in 3 weeks please give [place] a call regarding this procedure. We are working on cardiac clearance as well  You will be contacted by our office prior to your procedure for directions on holding your blood thinner.  If you do not hear from our office 1 week prior to your scheduled procedure, please call [phone] to discuss.   We have sent the following medications to your pharmacy for you to pick up at your convenience: Protonix  Please call with any questions or concerns.  Thank you,  Dr. Jackquline Denmark

## 2021-02-08 NOTE — Progress Notes (Signed)
IMPRESSION and PLAN:    #1.  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 Neg Colon and CT as below.  #2. GERD with large HH s/p repair Nov 2021 with resolution of UGI symptoms. Number  #3. Peritoneal mesothelioma (low grade) at time of [facility] repair. Being followed by Dr Clovis Riley. No progression.  Being followed by annual Cts. last CT March 2022 neg.     Plan: -Continue Protonix 40mg  po QD #90, 4 refills -EGD wt [facility] with polypectomy off plavix ( x 5 days) and after cardio clearence with Dr. Rush Landmark  I have discussed extensively with the patient and patient's daughter including risks and benefits including small but definite risks of bleeding. I will also discuss with Dr. Rush Landmark, our advanced endoscopist since polyps are broad-based and of significant size.   Addendum Cardiology clearance obtained Have sent staff message to Dr. Rush Landmark. RG HPI:    Chief Complaint:   Deborah Hutchinson is a 75 y.o. female with CAD on plavix, DM2, HH, OSA, COPD, LBP d/t OA, HTN, HLD  With recurrent symptomatic IDA despite IV iron with H+ stools, likely d/t Mod sized gastric polyps (hyperplastic) in setting of Plavix for CAD. Neg colon 11/2020  Hb 12.6 to 10.7 (01/2021) with ferritin 7.  Has chronic Epi/LUQ pain x over 5-6 yrs with neg prev WU.  She does feel weak and tired.  No pica to ice.  No over-the-counter nonsteroidals.   Continues to be on  Reglan 10 mg p.o. TID Protonix 40 mg p.o. once a day Pepcid 20 mg p.o. nightly  S/P HH repair by Dr. Abran Richard November 2021. Was found to have low-grade mesothelioma.  She is closely being followed by Dr. Clovis Riley.  No worsening. CT Abdo/pelvis March 2022 as below which did not show any progression.     Past GI procedures:  EGD 11/27/2020 - LA Grade C reflux esophagitis with no bleeding. Bx- neg - 3 cm hiatal hernia. S/P fundoplication. - 3  10-12 mm semi-sessile polyps with no bleeding and no stigmata of recent bleeding were found in the gastric body (1 at the diaphragmatic hiatus and the other 2 in proximal body of the stomach). Bx- Hyperplastic - Normal examined duodenum.  Colon 11/27/2020 - Seven 6 to 8 mm polyps in the mid descending colon, in the mid transverse colon, in the proximal ascending colon and in the mid ascending colon, removed with a cold snare. Resected and retrieved. Bx- TAs - Diverticulosis in the sigmoid colon and in the ascending colon.  -EGD 08/2019: Candida esophagitis, no stricture, 7 cm hiatal hernia.  Treated empirically for Candida esophagitis  EGD 12/14/2018: Large HH, hyperplastic gastric polyps, eso stricture s/p dil 50 Fr. EGD 08/17/2015 mod HH, neg SB Bx for celiac.  -UGI series 04/2019: HH, GERD, no strictures.  Ba tab passed without any problems.  -Colonoscopy 12/14/2018-colonic polyps s/p polypectomy, mild pancolonic div. Bx- TA.  -CT AP with contrast 02/09/2018: Neg, Stable left adrenal adenoma, small right inguinal hernia.  -GES 08/2019: delayed gastric emptying (12% emptied at hr 1, 29% at hr 2, 47% at hr 3, 64% an hr 4).  -CT AP 07/2020 Impression: 1. Mildly thickened appearance of the hemidiaphragms without a discrete measurable mass, overall similar relative to the prior CT dated 01/08/2020 in this patient with biopsy-proven peritoneal mesothelioma involving the right hemidiaphragm. No definite new peritoneal nodules are identified. 2. No ascites. 3.  Similar groundglass opacities/nodules throughout the lungs bilaterally, most conspicuous in the right lower lobe. These findings may be infectious, inflammatory, or neoplastic in etiology. 4. 4 mm solid right lower lobe pulmonary nodule, unchanged. The previously identified solid nodule in the right upper lobe is no longer seen. 5. Indeterminate bilateral adrenal lesions, measuring up to 1.7 cm on the left. Consider recharacterization with MRI is  recommended. 6. Multiple additional ancillary findings, as above.   ============================  PATHOLOGY: PATHOLOGY: Final Pathologic Diagnosis  A. "Frozen peritoneal nodule":        Mesothelioma, epithelioid type, low tumor grade, trabecular pattern.       See comment.   B. "Peritoneal nodule":       Mesothelioma, epithelioid type, low tumor grade, trabecular pattern.       See comment.     LHC 05/08/2019: Prox RCA lesion is 50% stenosed. Prox LAD lesion is 40% stenosed. The left ventricular systolic function is normal. LV end diastolic pressure is normal. The left ventricular ejection fraction is greater than 65% by visual estimate. There is no mitral valve regurgitation. Past Medical History:  Diagnosis Date   Aftercare following surgery 03/26/2020   Allergy    Angina pectoris ([employer]) 06/17/2020   Anxiety    Arthritis    Asthma    Bronchitis    CAD (coronary artery disease) 02/13/2019   Chest pain 05/08/2019   Chest pain of uncertain etiology    Chronic bilateral low back pain without sciatica 07/11/2019   Formatting of this note might be different from the original. Added automatically from request for surgery [id number]   Chronic obstructive pulmonary disease ([place]) 02/09/2017   Formatting of this note might be different from the original. Last Assessment & Plan:  I will review her PFTs after I obtain records from her pulmonologist. Based on this we will decide if she needs inhalers, subjectively they do not seem to have helped her at all. She may benefit from a pulmonary rehabilitation program Formatting of this note might be different from the original. Last Assessment    COPD (chronic obstructive pulmonary disease) ([place]) 02/09/2017   Coronary artery disease involving native coronary artery of native heart without angina pectoris 06/10/2015   Diabetes mellitus    Diabetes mellitus due to underlying condition with unspecified complications ([place]) 41/28/7867    Dyslipidemia 06/10/2015   Esophageal dysphagia 09/02/2019   Formatting of this note might be different from the original. Added automatically from request for surgery [id number]   Essential hypertension 06/10/2015   Gastroparesis 09/04/2019   GERD (gastroesophageal reflux disease)    H/O hiatal hernia    Headache 09/24/2018   Heart murmur    Hyperlipemia    Hypertension    Mixed dyslipidemia 06/10/2015   Moderate aortic stenosis 05/16/2019   Nausea and vomiting 06/16/2020   Nonspecific (abnormal) findings on radiological and other examination of gastrointestinal tract 05/19/2011   OSA (obstructive sleep apnea) 02/09/2017   Pain and swelling of toe of right foot 07/15/2016   Palpitations 06/02/2016   Paraesophageal hernia 10/30/2019   Formatting of this note might be different from the original. Added automatically from request for surgery [id number]   Peritoneal mesothelioma ([place]) 01/16/2020   S/P lumbar fusion 03/26/2020   Shortness of breath    on  excertion   Sleep apnea     Current Outpatient Medications  Medication Sig Dispense Refill   albuterol (PROVENTIL HFA;VENTOLIN HFA) 108 (90 Base) MCG/ACT inhaler Inhale 2 puffs into the lungs every 6 (  six) hours as needed for wheezing or shortness of breath. 3 Inhaler 3   amLODipine (NORVASC) 10 MG tablet Take 10 mg by mouth daily.     budesonide-formoterol (SYMBICORT) 160-4.5 MCG/ACT inhaler Inhale 2 puffs into the lungs 2 (two) times daily. 3 Inhaler 3   buPROPion (WELLBUTRIN XL) 300 MG 24 hr tablet Take 300 mg by mouth daily.      Cholecalciferol (VITAMIN D PO) Take 5,000 Units by mouth as directed. Every 2 weeks     clopidogrel (PLAVIX) 75 MG tablet Take 1 tablet (75 mg total) by mouth daily. 30 tablet 11   dicyclomine (BENTYL) 20 MG tablet Take 20 mg by mouth every 6 (six) hours as needed.            escitalopram (LEXAPRO) 10 MG tablet Take 10 mg by mouth daily.     famotidine (PEPCID) 40 MG tablet Take 1 tablet (40 mg total) by mouth  at bedtime. 180 tablet 3   fluticasone (VERAMYST) 27.5 MCG/SPRAY nasal spray Place 2 sprays into the nose daily.     gabapentin (NEURONTIN) 300 MG capsule Take 300 mg by mouth 3 (three) times daily as needed.      hydrALAZINE (APRESOLINE) 50 MG tablet Take 1 tablet (50 mg total) by mouth 3 (three) times daily. 180 tablet 2   hydrochlorothiazide (HYDRODIURIL) 25 MG tablet Take 1 tablet (25 mg total) by mouth daily. 60 tablet 2   Lancets (ACCU-CHEK MULTICLIX) lancets      linaclotide (LINZESS) 72 MCG capsule Take 72 mcg by mouth as needed.     losartan (COZAAR) 100 MG tablet Take 1 tablet (100 mg total) by mouth daily. 60 tablet 2   metFORMIN (GLUCOPHAGE) 500 MG tablet Take 500 mg by mouth 2 (two) times daily with a meal.      montelukast (SINGULAIR) 10 MG tablet Take 10 mg by mouth at bedtime.      Multiple Vitamin (MULTIVITAMIN) capsule Take 1 capsule by mouth daily.       oxybutynin (DITROPAN-XL) 10 MG 24 hr tablet Take 10 mg by mouth daily.     pantoprazole (PROTONIX) 40 MG tablet Take 1 tablet (40 mg total) by mouth daily. 60 tablet 2   Potassium Chloride CR (MICRO-K) 8 MEQ CPCR capsule CR TAKE 1 CAPSULE BY MOUTH ONCE DAILY FOR LOW POTASSIUM     simvastatin (ZOCOR) 20 MG tablet Take 20 mg by mouth at bedtime.     spironolactone (ALDACTONE) 25 MG tablet Take 1 tablet (25 mg total) by mouth daily. 60 tablet 2   traMADol (ULTRAM) 50 MG tablet Take 50 mg by mouth 2 (two) times daily as needed for pain.     VICTOZA 18 MG/3ML SOPN 12 mg daily.      BD PEN NEEDLE NANO U/F 32G X 4 MM MISC      isosorbide mononitrate (IMDUR) 120 MG 24 hr tablet Take 1 tablet (120 mg total) by mouth daily. (Patient not taking: Reported on 06/10/2019) 60 tablet 2   nitroGLYCERIN (NITROSTAT) 0.4 MG SL tablet DISSOLVE ONE TABLET UNDER THE TONGUE EVERY 5 MINUTES AS NEEDED FOR CHEST PAIN.  DO NOT EXCEED A TOTAL OF 3 DOSES IN 15 MINUTES (Patient not taking: Reported on 06/10/2019) 25 tablet 5   No current facility-administered  medications for this visit.    Past Surgical History:  Procedure Laterality Date   ABDOMINAL HYSTERECTOMY     ABDOMINAL HYSTERECTOMY     BREAST SURGERY     begin  mass,left  breast   COLONOSCOPY  08/17/2015   Colonic polyp status post polypectomy. Mild pancolonic diverticulosis. Highly redundant colon.    DILATION AND CURETTAGE OF UTERUS     one   ESOPHAGOGASTRODUODENOSCOPY  08/17/2015   Moderate hiatal hernia. Otherwise noraml EGD.    ESOPHAGOGASTRODUODENOSCOPY  09/03/2019   [place]   EUS  05/19/2011   Procedure: UPPER ENDOSCOPIC ULTRASOUND (EUS) LINEAR;  Surgeon: Owens Loffler, MD;  Location: [facility];  Service: Endoscopy;  Laterality: N/A;  radial linear    HERNIA REPAIR  05/09/2020   HYSTEROTOMY     KNEE SURGERY     LEFT HEART CATH AND CORONARY ANGIOGRAPHY     LEFT HEART CATH AND CORONARY ANGIOGRAPHY N/A 05/08/2019   Procedure: LEFT HEART CATH AND CORONARY ANGIOGRAPHY;  Surgeon: Burnell Blanks, MD;  Location: [facility];  Service: Cardiovascular;  Laterality: N/A;   right thumb     tendon repaire   RIGHT/LEFT HEART CATH AND CORONARY ANGIOGRAPHY N/A 06/23/2020   Procedure: RIGHT/LEFT HEART CATH AND CORONARY ANGIOGRAPHY;  Surgeon: Martinique, Peter M, MD;  Location: [facility];  Service: Cardiovascular;  Laterality: N/A;   SHOULDER SURGERY     rotator cuff repair   TRANSFORAMINAL LUMBAR INTERBODY FUSION L4-5   02/27/2020   WRIST SURGERY     rigth    Family History  Problem Relation Age of Onset   Diabetes Sister    Cancer Sister    Hypertension Daughter    Colon cancer Neg Hx    Liver disease Neg Hx    Pancreatic cancer Neg Hx    Esophageal cancer Neg Hx     Social History   Tobacco Use   Smoking status: Former    Packs/day: 1.00    Types: Cigarettes    Quit date: 05/17/2011    Years since quitting: 9.7   Smokeless tobacco: Never  Vaping Use   Vaping Use: Never used  Substance Use Topics   Alcohol use: No   Drug  use: No    Allergies  Allergen Reactions   Penicillins Rash    Reaction was 30years ago  Has never taken again Reaction was 30years ago  Has never taken again Has patient had a PCN reaction causing anaphylaxis, immediate rash, facial/tongue/throat swelling, SOB or lightheadedness with hypotension? no Has patient had a PCN reaction causing severe rash involving mucus membranes or skin necrosis?  no Has patient had a PCN reaction that required hospitilization?no  Has patient had a PCN reaction occurring within the last 10 years?  no If all of the above answers are "no" then may proceed with cephalosporin use   Ampicillin Hives   Tramadol Other (See Comments)    Hallunications       Review of Systems: All systems reviewed and negative except where noted in HPI.    Physical Exam:     BP 126/82   Pulse 67   Ht 5' 4.5" (1.638 m)   Wt 170 lb (77.1 kg)   SpO2 98%   BMI 28.73 kg/m   Gen: awake, alert, NAD HEENT: anicteric, no pallor CV: RRR, no mrg Pulm: CTA b/l Abd: soft, NT/ND, +BS throughout Ext: no c/c/e Neuro: nonfocal  CBC Latest Ref Rng & Units 01/13/2021 09/25/2020 06/23/2020  WBC - 8.1 6.0 -  Hemoglobin 12.0 - 16.0 10.8(A) 9.4(A) 9.9(L)  Hematocrit 36 - 46 34(A) 31(A) 29.0(L)  Platelets 150 - 399 444(A) 406(A) -   CMP Latest Ref  Rng & Units 09/25/2020 06/23/2020 06/23/2020  Glucose 65 - 99 mg/dL - - -  BUN 4 - 21 16 - -  Creatinine 0.5 - 1.1 1.2(A) - -  Sodium 137 - 147 137 144 145  Potassium 3.4 - 5.3 4.0 4.0 3.9  Chloride 99 - 108 107 - -  CO2 13 - 22 23(A) - -  Calcium 8.7 - 10.7 9.4 - -  Total Protein 6.0 - 8.5 g/dL - - -  Total Bilirubin 0.0 - 1.2 mg/dL - - -  Alkaline Phos 25 - 125 97 - -  AST 13 - 35 28 - -  ALT 7 - 35 19 - -       Haydin Dunn,MD 02/08/2021, 4:05 PM

## 2021-02-09 ENCOUNTER — Telehealth: Payer: Self-pay

## 2021-02-09 NOTE — Telephone Encounter (Signed)
Ok thank you. Sorry. I will try PCP next

## 2021-02-09 NOTE — Telephone Encounter (Signed)
   Primary Cardiologist: [name], MD  Chart reviewed as part of pre-operative protocol coverage. Given past medical history and time since last visit, based on ACC/AHA guidelines, Deborah Hutchinson would be at acceptable risk for the planned procedure without further cardiovascular testing.   Patient is prescribed Plavix from neurology.  Recommendations on holding medications will need to come from her neurology provider.  Patient was advised that if she develops new symptoms prior to surgery to contact our office to arrange a follow-up appointment.  She verbalized understanding.  I will route this recommendation to the requesting party via Epic fax function and remove from pre-op pool.  Please call with questions.  Deborah Ng. Deborah Woo NP-C    02/09/2021, 2:12 PM [address] Office [phone] Fax [phone]

## 2021-02-09 NOTE — Telephone Encounter (Signed)
[facility] Pre-operative Risk Assessment     Request for surgical clearance:     Endoscopy Procedure  What type of surgery is being performed?     EGD  When is this surgery scheduled?     TBA  What type of clearance is required ?   Pharmacy  Are there any medications that need to be held prior to surgery and how long? Plavix 5 day hold  Practice name and name of physician performing surgery?      [facility]  What is your office phone and fax number?      Phone- [phone]  [phone]  Anesthesia type (None, local, MAC, general) ?       MAC

## 2021-02-15 ENCOUNTER — Telehealth: Payer: Self-pay

## 2021-02-15 DIAGNOSIS — J301 Allergic rhinitis due to pollen: Secondary | ICD-10-CM | POA: Diagnosis not present

## 2021-02-15 NOTE — Telephone Encounter (Signed)
LVM on emergency contact. Dr Rica Records said that she will need an ov regarding holding her blood thinner

## 2021-02-15 NOTE — Telephone Encounter (Signed)
-----   Message from Irving Copas., MD sent at 02/15/2021  7:46 AM EDT ----- Regarding: RE: EGD with polypectomy RG, Happy to help. Since she still has iron deficiency it seems reasonable to try and remove these to see if that will help. Tahari Clabaugh and Brooke, please move forward with scheduling this patient an EGD with EMR 75-minute case next available with me. Please update Dr. Lyndel Safe and myself as to the timing of the procedure. Looks like the Plavix hold has already been obtained for prior procedure. Thanks. GM ----- Message ----- From: Jackquline Denmark, MD Sent: 02/14/2021   7:32 PM EDT To: Irving Copas., MD, [place], CMA Subject: EGD with polypectomy                           Hi Gabe  75yr old with recurrent IDA d/t gastric polyps (3, Bx-hyperplastic) in setting of Plavix Cardiology clearance has been obtained for EGD and to hold Plavix 5 days before  Will need to help with EGD with gastric polypectomy at [facility].   Please let me know what you think  Thanks a lot  RG

## 2021-02-15 NOTE — Telephone Encounter (Signed)
Left message on machine to call back  

## 2021-02-16 NOTE — Telephone Encounter (Signed)
Left message on machine to call back  

## 2021-02-16 NOTE — Telephone Encounter (Signed)
Patient's daughter was made aware that she needs to call her PCP regarding scheduling an appointment with them and to call us regarding setting up EGD time

## 2021-02-17 DIAGNOSIS — J309 Allergic rhinitis, unspecified: Secondary | ICD-10-CM | POA: Diagnosis not present

## 2021-02-17 DIAGNOSIS — M7062 Trochanteric bursitis, left hip: Secondary | ICD-10-CM | POA: Diagnosis not present

## 2021-02-17 DIAGNOSIS — N3281 Overactive bladder: Secondary | ICD-10-CM | POA: Diagnosis not present

## 2021-02-17 DIAGNOSIS — E663 Overweight: Secondary | ICD-10-CM | POA: Diagnosis not present

## 2021-02-17 DIAGNOSIS — R413 Other amnesia: Secondary | ICD-10-CM | POA: Diagnosis not present

## 2021-02-17 DIAGNOSIS — M25473 Effusion, unspecified ankle: Secondary | ICD-10-CM | POA: Diagnosis not present

## 2021-02-17 DIAGNOSIS — I10 Essential (primary) hypertension: Secondary | ICD-10-CM | POA: Diagnosis not present

## 2021-02-17 DIAGNOSIS — M7061 Trochanteric bursitis, right hip: Secondary | ICD-10-CM | POA: Diagnosis not present

## 2021-02-17 DIAGNOSIS — K219 Gastro-esophageal reflux disease without esophagitis: Secondary | ICD-10-CM | POA: Diagnosis not present

## 2021-02-17 DIAGNOSIS — M159 Polyosteoarthritis, unspecified: Secondary | ICD-10-CM | POA: Diagnosis not present

## 2021-02-17 DIAGNOSIS — Z6828 Body mass index (BMI) 28.0-28.9, adult: Secondary | ICD-10-CM | POA: Diagnosis not present

## 2021-02-17 NOTE — Telephone Encounter (Signed)
Left message on machine to call back  

## 2021-02-18 ENCOUNTER — Other Ambulatory Visit: Payer: Self-pay

## 2021-02-18 DIAGNOSIS — E1140 Type 2 diabetes mellitus with diabetic neuropathy, unspecified: Secondary | ICD-10-CM | POA: Diagnosis not present

## 2021-02-18 DIAGNOSIS — Z79899 Other long term (current) drug therapy: Secondary | ICD-10-CM | POA: Diagnosis not present

## 2021-02-18 DIAGNOSIS — E559 Vitamin D deficiency, unspecified: Secondary | ICD-10-CM | POA: Diagnosis not present

## 2021-02-18 DIAGNOSIS — D509 Iron deficiency anemia, unspecified: Secondary | ICD-10-CM

## 2021-02-18 DIAGNOSIS — E785 Hyperlipidemia, unspecified: Secondary | ICD-10-CM | POA: Diagnosis not present

## 2021-02-18 DIAGNOSIS — K317 Polyp of stomach and duodenum: Secondary | ICD-10-CM

## 2021-02-18 NOTE — Telephone Encounter (Addendum)
No I haven't. Patient's daughter was informed that patient would need a visit with her PCP for clearance and they didn't yet. But I called the office and told them that her procedure is scheduled for 11-14 and they will try to get her in as soon as they can and I told patient to call tomorrow afternoon regarding getting an appointment with PCP.  And I told the office I would call back on Monday regarding her appointment time

## 2021-02-18 NOTE — Telephone Encounter (Signed)
Deborah Hutchinson the pt was scheduled for 03/22/21 at 145 pm with Dr Rush Landmark.  She has been instructed and all information has been mailed. Have you heard about the plavix yet?

## 2021-02-19 DIAGNOSIS — E785 Hyperlipidemia, unspecified: Secondary | ICD-10-CM | POA: Diagnosis not present

## 2021-02-19 DIAGNOSIS — I10 Essential (primary) hypertension: Secondary | ICD-10-CM | POA: Diagnosis not present

## 2021-02-19 DIAGNOSIS — I2510 Atherosclerotic heart disease of native coronary artery without angina pectoris: Secondary | ICD-10-CM | POA: Diagnosis not present

## 2021-02-22 DIAGNOSIS — M4316 Spondylolisthesis, lumbar region: Secondary | ICD-10-CM | POA: Diagnosis not present

## 2021-02-22 DIAGNOSIS — M5459 Other low back pain: Secondary | ICD-10-CM | POA: Diagnosis not present

## 2021-02-22 DIAGNOSIS — J301 Allergic rhinitis due to pollen: Secondary | ICD-10-CM | POA: Diagnosis not present

## 2021-02-23 ENCOUNTER — Telehealth: Payer: Self-pay | Admitting: Gastroenterology

## 2021-02-23 NOTE — Telephone Encounter (Addendum)
Was told appt with Dr. Rica Records would be on 10-25 as a televisit

## 2021-02-23 NOTE — Telephone Encounter (Signed)
Inbound call from [facility] from [facility] requesting a call for medical clearence for this pt. From Dr. Rica Records. Her best contact is [phone]. Please advise. Thank you.

## 2021-02-23 NOTE — Telephone Encounter (Signed)
Patient has appt 10-25. Labs havent been reviewed yet by Dr but they will potentially be able to fax the form on Monday at the [facility]. Dr Rica Records is out of the office for the week.

## 2021-02-25 ENCOUNTER — Telehealth: Payer: Self-pay | Admitting: General Surgery

## 2021-02-25 DIAGNOSIS — E663 Overweight: Secondary | ICD-10-CM | POA: Diagnosis not present

## 2021-02-25 DIAGNOSIS — F331 Major depressive disorder, recurrent, moderate: Secondary | ICD-10-CM | POA: Diagnosis not present

## 2021-02-25 DIAGNOSIS — Z01818 Encounter for other preprocedural examination: Secondary | ICD-10-CM | POA: Diagnosis not present

## 2021-02-25 DIAGNOSIS — F419 Anxiety disorder, unspecified: Secondary | ICD-10-CM | POA: Diagnosis not present

## 2021-02-25 NOTE — Telephone Encounter (Signed)
[facility] Pre-operative Risk Assessment     Request for surgical clearance:     Endoscopy Procedure  What type of surgery is being performed?     Endoscopy at [facility]  When is this surgery scheduled?     03/22/2021  What type of clearance is required ?   Pharmacy  Are there any medications that need to be held prior to surgery and how long? Plavix for 5 days  Practice name and name of physician performing surgery?      [facility]  What is your office phone and fax number?      Phone- [phone]  [phone]  Anesthesia type (None, local, MAC, general) ?       MAC

## 2021-02-25 NOTE — Telephone Encounter (Signed)
Patient called to follow up on clearance.

## 2021-02-25 NOTE — Telephone Encounter (Signed)
Contacted the patient regarding the plavix clearance.We are unable to get a clearance from Dr Rica Records- she stated to get it from Dr Clearence Ped. Dr Wilber Oliphant does not have this patient on plavix it is prescribed by Dr Loretta Plume. Will route anticoagulation clearance to his office.

## 2021-02-26 NOTE — Telephone Encounter (Signed)
Notified the patient to stop her plavix on 03/17/2021 per Dr Loretta Plume. The patient verbalized understanding.

## 2021-03-01 ENCOUNTER — Telehealth: Payer: Self-pay | Admitting: Neurology

## 2021-03-01 DIAGNOSIS — Z008 Encounter for other general examination: Secondary | ICD-10-CM | POA: Diagnosis not present

## 2021-03-01 DIAGNOSIS — R2681 Unsteadiness on feet: Secondary | ICD-10-CM | POA: Diagnosis not present

## 2021-03-01 DIAGNOSIS — Z0000 Encounter for general adult medical examination without abnormal findings: Secondary | ICD-10-CM | POA: Diagnosis not present

## 2021-03-01 DIAGNOSIS — E663 Overweight: Secondary | ICD-10-CM | POA: Diagnosis not present

## 2021-03-01 DIAGNOSIS — R011 Cardiac murmur, unspecified: Secondary | ICD-10-CM | POA: Diagnosis not present

## 2021-03-01 DIAGNOSIS — Z6828 Body mass index (BMI) 28.0-28.9, adult: Secondary | ICD-10-CM | POA: Diagnosis not present

## 2021-03-01 NOTE — Telephone Encounter (Signed)
Currently Deborah Hutchinson does not have ppw for patient. Will recheck.

## 2021-03-01 NOTE — Telephone Encounter (Signed)
Surgical clearance form release received?  Trigger finger release surgery pending, okay to stop Plavix?   [phone], fax

## 2021-03-03 NOTE — Telephone Encounter (Signed)
LMOVM per Dr.Jaffe in his medical opinion pt is okay to have surgery.

## 2021-03-05 ENCOUNTER — Telehealth: Payer: Self-pay | Admitting: Gastroenterology

## 2021-03-05 NOTE — Telephone Encounter (Signed)
LMOVM for Deborah Hutchinson per [facility] please send form for him to fill out. Gave both fax numbers for the office.

## 2021-03-05 NOTE — Telephone Encounter (Signed)
Ivin Booty from [facility], Please send a letter to [phone] stating patient cleared for surgery and how many days she should be off of her Plavix.

## 2021-03-05 NOTE — Telephone Encounter (Signed)
Patients daughter Barnett Applebaum is called states the patient is having a lot of heartburn and they would like to know if she should increase her Protonix medication. Please call to advise.

## 2021-03-08 DIAGNOSIS — E785 Hyperlipidemia, unspecified: Secondary | ICD-10-CM | POA: Diagnosis not present

## 2021-03-08 DIAGNOSIS — I2510 Atherosclerotic heart disease of native coronary artery without angina pectoris: Secondary | ICD-10-CM | POA: Diagnosis not present

## 2021-03-08 DIAGNOSIS — I10 Essential (primary) hypertension: Secondary | ICD-10-CM | POA: Diagnosis not present

## 2021-03-11 ENCOUNTER — Encounter (HOSPITAL_COMMUNITY): Payer: Self-pay | Admitting: Gastroenterology

## 2021-03-12 ENCOUNTER — Other Ambulatory Visit: Payer: Self-pay

## 2021-03-12 ENCOUNTER — Ambulatory Visit (INDEPENDENT_AMBULATORY_CARE_PROVIDER_SITE_OTHER): Payer: Medicare HMO | Admitting: Sports Medicine

## 2021-03-12 DIAGNOSIS — E119 Type 2 diabetes mellitus without complications: Secondary | ICD-10-CM

## 2021-03-12 DIAGNOSIS — J301 Allergic rhinitis due to pollen: Secondary | ICD-10-CM | POA: Diagnosis not present

## 2021-03-12 DIAGNOSIS — R252 Cramp and spasm: Secondary | ICD-10-CM

## 2021-03-12 NOTE — Progress Notes (Signed)
Subjective: Deborah Hutchinson is a 75 y.o. female patient with history of diabetes who presents to office today complaining of occasional cramping in both feet and states that her right great toenail seems like it is getting thick and growing back.  Patient states that the cramping has been going on for about 2 or 3 months sometimes getting worse even happening multiple times throughout the day.  Patient denies any major health changes but does state that her doctor did take her off of the Protonix.  No other pedal complaints noted.  Fasting blood sugar not recorded.  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   Gastroparesis 09/04/2019   Esophageal dysphagia 09/02/2019   Chronic bilateral low back pain without sciatica 07/11/2019   Moderate aortic stenosis 05/16/2019   Chest pain 05/08/2019   Chest pain of uncertain etiology    CAD (coronary artery disease) 02/13/2019   Headache 09/24/2018   Asthma 10/05/2017   Chronic obstructive pulmonary disease ([place]) 02/09/2017   OSA (obstructive sleep apnea) 02/09/2017   COPD (chronic obstructive pulmonary disease) ([place]) 02/09/2017   Pain and swelling of toe of right foot 07/15/2016   Palpitations 06/02/2016   Diabetes mellitus due to underlying condition with unspecified complications ([place]) 46/96/2952   Mixed dyslipidemia 06/10/2015   Essential hypertension 06/10/2015   Coronary artery disease involving native coronary artery of native heart without angina pectoris 06/10/2015   Dyslipidemia  06/10/2015   Current Outpatient Medications on File Prior to Visit  Medication Sig Dispense Refill   albuterol (PROVENTIL HFA;VENTOLIN HFA) 108 (90 Base) MCG/ACT inhaler Inhale 2 puffs into the lungs every 6 (six) hours as needed for wheezing or shortness of breath. 3 Inhaler 3   azelastine (ASTELIN) 0.1 % nasal spray Use two sprays in each nostril twice daily as directed for nasal drainage. 30 mL 5   buPROPion (WELLBUTRIN XL) 300 MG 24 hr tablet Take 300 mg by mouth daily.      canagliflozin (INVOKANA) 300 MG TABS tablet Take 300 mg by mouth daily.     CELECOXIB PO Take by mouth as needed.     Cholecalciferol (VITAMIN D3) 1.25 MG (50000 UT) CAPS Take 50,000 Units by mouth every 30 (thirty) days.     clopidogrel (PLAVIX) 75 MG tablet TAKE 1 TABLET EVERY DAY 30 tablet 1   dicyclomine (BENTYL) 10 MG capsule Take 10 mg by mouth 2 (two) times daily as needed for spasms.     donepezil (ARICEPT) 10 MG tablet Take 10 mg by mouth daily at 12 noon.     escitalopram (LEXAPRO) 20 MG tablet Take 20 mg by mouth daily.     famotidine (PEPCID) 20 MG tablet Take 20 mg by mouth in the morning and at bedtime.     fluticasone (FLONASE) 50 MCG/ACT nasal spray Use two sprays in each nostril once daily 16 g 5   Fluticasone-Umeclidin-Vilant (TRELEGY ELLIPTA) 200-62.5-25 MCG/INH AEPB Inhale 1 puff into the lungs daily.  isosorbide mononitrate (IMDUR) 120 MG 24 hr tablet Take 1 tablet by mouth once daily 60 tablet 6   levocetirizine (XYZAL) 5 MG tablet TAKE 1 TABLET BY MOUTH ONCE DAILY IN THE EVENING 30 tablet 5   liraglutide (VICTOZA) 18 MG/3ML SOPN Inject 1.8 mg into the skin daily.     losartan-hydrochlorothiazide (HYZAAR) 100-25 MG tablet Take 1 tablet by mouth daily.     montelukast (SINGULAIR) 10 MG tablet Take 10 mg by mouth at bedtime.      Multiple Vitamin (MULTIVITAMIN) capsule Take 1 capsule by mouth daily.     pantoprazole (PROTONIX) 40 MG tablet Take 1 tablet (40 mg total) by mouth daily. 90 tablet 4    Potassium Chloride CR (MICRO-K) 8 MEQ CPCR capsule CR Take 8 mEq by mouth daily.     rosuvastatin (CRESTOR) 20 MG tablet Take 1 tablet (20 mg total) by mouth daily. 90 tablet 2   Spacer/Aero-Holding [facility] Use as directed with inhaler. 1 each 1   traZODone (DESYREL) 50 MG tablet Take 50 mg by mouth at bedtime.     No current facility-administered medications on file prior to visit.   Allergies  Allergen Reactions   Penicillins Rash    Reaction was 30years ago  Has never taken again Reaction was 30years ago  Has never taken again Has patient had a PCN reaction causing anaphylaxis, immediate rash, facial/tongue/throat swelling, SOB or lightheadedness with hypotension? no Has patient had a PCN reaction causing severe rash involving mucus membranes or skin necrosis?  no Has patient had a PCN reaction that required hospitilization?no  Has patient had a PCN reaction occurring within the last 10 years?  no If all of the above answers are "no" then may proceed with cephalosporin use   Ampicillin Hives   Tramadol Other (See Comments)    Hallunications      Recent Results (from the past 2160 hour(s))  CBC and differential     Status: Abnormal   Collection Time: 01/13/21 12:00 AM  Result Value Ref Range   Hemoglobin 10.8 (A) 12.0 - 16.0   HCT 34 (A) 36 - 46   Neutrophils Absolute 5.27    Platelets 444 (A) 150 - 399   WBC 8.1   CBC     Status: None   Collection Time: 01/13/21 12:00 AM  Result Value Ref Range   RBC 4.05 3.87 - 5.11  Iron and TIBC     Status: Abnormal   Collection Time: 01/13/21 11:11 AM  Result Value Ref Range   Iron 27 (L) 28 - 170 ug/dL   TIBC 515 (H) 250 - 450 ug/dL   Saturation Ratios 5 (L) 10.4 - 31.8 %   UIBC 488 ug/dL    Comment: Performed at [facility], [place] [address], [place], [place] [address]  Ferritin     Status: Abnormal   Collection Time: 01/13/21 11:11 AM  Result Value Ref Range   Ferritin 7 (L) 11 - 307 ng/mL     Comment: Performed at [facility], [place] [address] [place], [place] [address]    Objective: General: Patient is awake, alert, and oriented x 3 and in no acute distress.  Integument: Skin is warm, dry and supple bilateral.  All nails are well manicured however at the right hallux nail at area of previous nail avulsion there is multiple small fragments in the nailbed.  No signs of infection. No open lesions or preulcerative lesions present bilateral. Remaining integument unremarkable.  Vasculature:  Dorsalis Pedis pulse 1/4 bilateral. Posterior Tibial pulse 1/4 bilateral.  Capillary fill time <3 sec 1-5 bilateral. Positive hair growth to the level of the digits. Temperature gradient within normal limits. No varicosities present bilateral. No edema present bilateral.   Neurology: Gross sensation present via light touch   Musculoskeletal: Subjective cramping to both feet.  Very minimal hammertoe deformity.  No other bony abnormalities noted.  Assessment and Plan: Problem List Items Addressed This Visit   None Visit Diagnoses     Cramping of feet    -  Primary   Diabetes mellitus without complication ([place])           -Examined patient. -Discussed and educated patient on diabetic foot care, especially with  regards to the vascular, neurological and musculoskeletal systems.  -Discussed with patient possible nature or etiology of cramping recommend patient to try over-the-counter cramp these vitamin, advised her to talk with her doctor before trying anything that has magnesium in it, discussed with patient also home remedies of pickle juice, mustard, tonic water for cramping  -Advised patient for any pain related to cramping may also try topical like Aspercreme, Biofreeze, or Voltaren gel as needed. -Mechanically debrided all fragments of the right hallux nail bed using sterile nail nipper and filed with dremel without incident  -Answered all patient questions -Patient to  return if symptoms fail to continue to improve -Patient advised to call the office if any problems or questions arise in the meantime.  Landis Martins, DPM

## 2021-03-12 NOTE — Patient Instructions (Signed)
For Cramps: recommend OTC cramp ease vitamin or pickle juice, tonic water, or mustard  For pain with the cramps: recommend OTC topical voltaren, aspercreme, or biofreeze

## 2021-03-17 DIAGNOSIS — G8929 Other chronic pain: Secondary | ICD-10-CM | POA: Diagnosis not present

## 2021-03-17 DIAGNOSIS — M7060 Trochanteric bursitis, unspecified hip: Secondary | ICD-10-CM | POA: Diagnosis not present

## 2021-03-17 DIAGNOSIS — M7918 Myalgia, other site: Secondary | ICD-10-CM | POA: Diagnosis not present

## 2021-03-17 DIAGNOSIS — M533 Sacrococcygeal disorders, not elsewhere classified: Secondary | ICD-10-CM | POA: Diagnosis not present

## 2021-03-17 DIAGNOSIS — Z79899 Other long term (current) drug therapy: Secondary | ICD-10-CM | POA: Diagnosis not present

## 2021-03-17 DIAGNOSIS — M545 Low back pain, unspecified: Secondary | ICD-10-CM | POA: Diagnosis not present

## 2021-03-17 DIAGNOSIS — Z981 Arthrodesis status: Secondary | ICD-10-CM | POA: Diagnosis not present

## 2021-03-17 DIAGNOSIS — G4733 Obstructive sleep apnea (adult) (pediatric): Secondary | ICD-10-CM | POA: Diagnosis not present

## 2021-03-19 DIAGNOSIS — J4540 Moderate persistent asthma, uncomplicated: Secondary | ICD-10-CM | POA: Diagnosis not present

## 2021-03-19 DIAGNOSIS — R5383 Other fatigue: Secondary | ICD-10-CM | POA: Diagnosis not present

## 2021-03-19 DIAGNOSIS — J301 Allergic rhinitis due to pollen: Secondary | ICD-10-CM | POA: Diagnosis not present

## 2021-03-19 DIAGNOSIS — Z8616 Personal history of COVID-19: Secondary | ICD-10-CM | POA: Diagnosis not present

## 2021-03-19 DIAGNOSIS — G4733 Obstructive sleep apnea (adult) (pediatric): Secondary | ICD-10-CM | POA: Diagnosis not present

## 2021-03-22 ENCOUNTER — Other Ambulatory Visit: Payer: Self-pay

## 2021-03-22 ENCOUNTER — Encounter (HOSPITAL_COMMUNITY): Payer: Self-pay | Admitting: Gastroenterology

## 2021-03-22 ENCOUNTER — Ambulatory Visit (HOSPITAL_COMMUNITY): Payer: Medicare HMO | Admitting: Anesthesiology

## 2021-03-22 ENCOUNTER — Encounter (HOSPITAL_COMMUNITY)
Admission: RE | Disposition: A | Discharge status: Home / Self Care | Payer: Self-pay | Source: Home / Self Care | Attending: Gastroenterology

## 2021-03-22 ENCOUNTER — Ambulatory Visit (HOSPITAL_COMMUNITY)
Admission: RE | Admit: 2021-03-22 | Discharge: 2021-03-22 | Disposition: A | Discharge status: Home / Self Care | Payer: Medicare HMO | Attending: Gastroenterology | Admitting: Gastroenterology

## 2021-03-22 DIAGNOSIS — K259 Gastric ulcer, unspecified as acute or chronic, without hemorrhage or perforation: Secondary | ICD-10-CM | POA: Diagnosis not present

## 2021-03-22 DIAGNOSIS — K317 Polyp of stomach and duodenum: Secondary | ICD-10-CM | POA: Diagnosis not present

## 2021-03-22 DIAGNOSIS — H9313 Tinnitus, bilateral: Secondary | ICD-10-CM

## 2021-03-22 DIAGNOSIS — K2289 Other specified disease of esophagus: Secondary | ICD-10-CM | POA: Diagnosis not present

## 2021-03-22 DIAGNOSIS — R519 Headache, unspecified: Secondary | ICD-10-CM

## 2021-03-22 DIAGNOSIS — K31819 Angiodysplasia of stomach and duodenum without bleeding: Secondary | ICD-10-CM | POA: Diagnosis not present

## 2021-03-22 DIAGNOSIS — K2970 Gastritis, unspecified, without bleeding: Secondary | ICD-10-CM | POA: Diagnosis not present

## 2021-03-22 DIAGNOSIS — K2100 Gastro-esophageal reflux disease with esophagitis, without bleeding: Secondary | ICD-10-CM | POA: Insufficient documentation

## 2021-03-22 DIAGNOSIS — K449 Diaphragmatic hernia without obstruction or gangrene: Secondary | ICD-10-CM | POA: Diagnosis not present

## 2021-03-22 DIAGNOSIS — D509 Iron deficiency anemia, unspecified: Secondary | ICD-10-CM | POA: Diagnosis not present

## 2021-03-22 HISTORY — PX: HEMOSTASIS CLIP PLACEMENT: SHX6857

## 2021-03-22 HISTORY — PX: ESOPHAGOGASTRODUODENOSCOPY (EGD) WITH PROPOFOL: SHX5813

## 2021-03-22 HISTORY — PX: ENDOSCOPIC MUCOSAL RESECTION: SHX6839

## 2021-03-22 HISTORY — PX: SUBMUCOSAL LIFTING INJECTION: SHX6855

## 2021-03-22 HISTORY — PX: BIOPSY: SHX5522

## 2021-03-22 LAB — GLUCOSE, CAPILLARY: Glucose-Capillary: 73 mg/dL (ref 70–99)

## 2021-03-22 SURGERY — ESOPHAGOGASTRODUODENOSCOPY (EGD) WITH PROPOFOL
Anesthesia: Monitor Anesthesia Care

## 2021-03-22 MED ORDER — ONDANSETRON HCL 4 MG/2ML IJ SOLN
INTRAMUSCULAR | Status: DC | PRN
  Administered 2021-03-22: 4 mg via INTRAVENOUS

## 2021-03-22 MED ORDER — LIDOCAINE 2% (20 MG/ML) 5 ML SYRINGE
INTRAMUSCULAR | Status: DC | PRN
  Administered 2021-03-22: 80 mg via INTRAVENOUS

## 2021-03-22 MED ORDER — ROCURONIUM BROMIDE 100 MG/10ML IV SOLN
INTRAVENOUS | Status: DC | PRN
  Administered 2021-03-22: 50 mg via INTRAVENOUS

## 2021-03-22 MED ORDER — PHENYLEPHRINE HCL (PRESSORS) 10 MG/ML IV SOLN
INTRAVENOUS | Status: DC | PRN
  Administered 2021-03-22: 120 ug via INTRAVENOUS
  Administered 2021-03-22: 160 ug via INTRAVENOUS
  Administered 2021-03-22: 200 ug via INTRAVENOUS

## 2021-03-22 MED ORDER — PHENYLEPHRINE HCL (PRESSORS) 10 MG/ML IV SOLN
INTRAVENOUS | Status: AC
  Filled 2021-03-22: qty 2

## 2021-03-22 MED ORDER — SODIUM CHLORIDE 0.9 % IV SOLN: INTRAVENOUS | Status: DC

## 2021-03-22 MED ORDER — PROPOFOL 10 MG/ML IV BOLUS
INTRAVENOUS | Status: DC | PRN
  Administered 2021-03-22 (×2): 100 mg via INTRAVENOUS

## 2021-03-22 MED ORDER — DEXAMETHASONE SODIUM PHOSPHATE 10 MG/ML IJ SOLN
INTRAMUSCULAR | Status: DC | PRN
  Administered 2021-03-22: 8 mg via INTRAVENOUS

## 2021-03-22 MED ORDER — FENTANYL CITRATE (PF) 100 MCG/2ML IJ SOLN
INTRAMUSCULAR | Status: AC
  Filled 2021-03-22: qty 2

## 2021-03-22 MED ORDER — SUGAMMADEX SODIUM 500 MG/5ML IV SOLN
INTRAVENOUS | Status: DC | PRN
  Administered 2021-03-22: 300 mg via INTRAVENOUS

## 2021-03-22 MED ORDER — CLOPIDOGREL BISULFATE 75 MG PO TABS: 75.0000 mg | ORAL_TABLET | Freq: Every day | ORAL | 6 refills | Status: DC

## 2021-03-22 MED ORDER — LACTATED RINGERS IV SOLN
INTRAVENOUS | Status: AC | PRN
  Administered 2021-03-22: 1000 mL via INTRAVENOUS

## 2021-03-22 MED ORDER — FENTANYL CITRATE (PF) 100 MCG/2ML IJ SOLN
INTRAMUSCULAR | Status: DC | PRN
  Administered 2021-03-22 (×2): 50 ug via INTRAVENOUS

## 2021-03-22 MED ORDER — HYDRALAZINE HCL 20 MG/ML IJ SOLN
INTRAMUSCULAR | Status: DC | PRN
  Administered 2021-03-22 (×2): 5 mg via INTRAVENOUS

## 2021-03-22 MED ORDER — PROPOFOL 10 MG/ML IV BOLUS
INTRAVENOUS | Status: AC
  Filled 2021-03-22: qty 20

## 2021-03-22 MED ORDER — PHENYLEPHRINE HCL-NACL 20-0.9 MG/250ML-% IV SOLN
INTRAVENOUS | Status: DC | PRN
  Administered 2021-03-22: 50 ug/min via INTRAVENOUS

## 2021-03-22 MED ORDER — PANTOPRAZOLE SODIUM 40 MG PO TBEC: 40.0000 mg | DELAYED_RELEASE_TABLET | Freq: Two times a day (BID) | ORAL | 12 refills | Status: DC

## 2021-03-22 MED ORDER — EPHEDRINE SULFATE-NACL 50-0.9 MG/10ML-% IV SOSY
PREFILLED_SYRINGE | INTRAVENOUS | Status: DC | PRN
  Administered 2021-03-22 (×3): 5 mg via INTRAVENOUS

## 2021-03-22 SURGICAL SUPPLY — 14 items

## 2021-03-22 NOTE — Discharge Instructions (Signed)
YOU HAD AN ENDOSCOPIC PROCEDURE TODAY: Refer to the procedure report and other information in the discharge instructions given to you for any specific questions about what was found during the examination. If this information does not answer your questions, please call [facility] office at [phone] to clarify.   YOU SHOULD EXPECT: Some feelings of bloating in the abdomen. Passage of more gas than usual. Walking can help get rid of the air that was put into your GI tract during the procedure and reduce the bloating. If you had a lower endoscopy (such as a colonoscopy or flexible sigmoidoscopy) you may notice spotting of blood in your stool or on the toilet paper. Some abdominal soreness may be present for a day or two, also.  DIET: Your first meal following the procedure should be a light meal and then it is ok to progress to your normal diet. A half-sandwich or bowl of soup is an example of a good first meal. Heavy or fried foods are harder to digest and may make you feel nauseous or bloated. Drink plenty of fluids but you should avoid alcoholic beverages for 24 hours. If you had a esophageal dilation, please see attached instructions for diet.    ACTIVITY: Your care partner should take you home directly after the procedure. You should plan to take it easy, moving slowly for the rest of the day. You can resume normal activity the day after the procedure however YOU SHOULD NOT DRIVE, use power tools, machinery or perform tasks that involve climbing or major physical exertion for 24 hours (because of the sedation medicines used during the test).   SYMPTOMS TO REPORT IMMEDIATELY: A gastroenterologist can be reached at any hour. Please call [phone]  for any of the following symptoms:   Following upper endoscopy (EGD, EUS, ERCP, esophageal dilation) Vomiting of blood or coffee ground material  New, significant abdominal pain  New, significant chest pain or pain under the shoulder blades  Painful or  persistently difficult swallowing  New shortness of breath  Black, tarry-looking or red, bloody stools  FOLLOW UP:  If any biopsies were taken you will be contacted by phone or by letter within the next 1-3 weeks. Call [phone]  if you have not heard about the biopsies in 3 weeks.  Please also call with any specific questions about appointments or follow up tests.  

## 2021-03-22 NOTE — Anesthesia Postprocedure Evaluation (Signed)
Anesthesia Post Note  Patient: [place]  Procedure(s) Performed: ESOPHAGOGASTRODUODENOSCOPY (EGD) WITH PROPOFOL ENDOSCOPIC MUCOSAL RESECTION SUBMUCOSAL LIFTING INJECTION HEMOSTASIS CLIP PLACEMENT BIOPSY     Patient location during evaluation: PACU Anesthesia Type: MAC Level of consciousness: awake and alert Pain management: pain level controlled Vital Signs Assessment: post-procedure vital signs reviewed and stable Respiratory status: spontaneous breathing, nonlabored ventilation, respiratory function stable and patient connected to nasal cannula oxygen Cardiovascular status: stable and blood pressure returned to baseline Postop Assessment: no apparent nausea or vomiting Anesthetic complications: no   No notable events documented.  Last Vitals:  Vitals:   03/22/21 1430 03/22/21 1440  BP: (!) 148/55 (!) 142/54  Pulse: 73 75  Resp: 19 18  Temp:    SpO2: 100% 99%    Last Pain:  Vitals:   03/22/21 1440  TempSrc:   PainSc: 0-No pain                 Nahdia Doucet

## 2021-03-22 NOTE — H&P (Signed)
GASTROENTEROLOGY PROCEDURE H&P NOTE   Primary Care Physician: [name], MD  HPI: Deborah Hutchinson is a 75 y.o. female who presents for EGD with EMR for gastric polyps resection in setting of iron deficiency anemia.  Past Medical History:  Diagnosis Date   Aftercare following surgery 03/26/2020   Allergy    Angina pectoris ([place]) 06/17/2020   Anxiety    Arthritis    Asthma    Bronchitis    CAD (coronary artery disease) 02/13/2019   Chest pain 05/08/2019   Chest pain of uncertain etiology    Chronic bilateral low back pain without sciatica 07/11/2019   Formatting of this note might be different from the original. Added automatically from request for surgery [id number]   Chronic obstructive pulmonary disease ([place]) 02/09/2017   Formatting of this note might be different from the original. Last Assessment & Plan:  I will review her PFTs after I obtain records from her pulmonologist. Based on this we will decide if she needs inhalers, subjectively they do not seem to have helped her at all. She may benefit from a pulmonary rehabilitation program Formatting of this note might be different from the original. Last Assessment    COPD (chronic obstructive pulmonary disease) ([place]) 02/09/2017   Coronary artery disease involving native coronary artery of native heart without angina pectoris 06/10/2015   Diabetes mellitus    Diabetes mellitus due to underlying condition with unspecified complications ([place]) [id number]/23/[id number]   Dyslipidemia 06/10/2015   Esophageal dysphagia 09/02/2019   Formatting of this note might be different from the original. Added automatically from request for surgery [id number]   Essential hypertension 06/10/2015   Gastroparesis 09/04/2019   GERD (gastroesophageal reflux disease)    H/O hiatal hernia    Headache 09/24/2018   Heart murmur    Hyperlipemia    Hypertension    Mixed dyslipidemia 06/10/2015   Moderate aortic stenosis 05/16/2019   Nausea and vomiting 06/16/2020    Nonspecific (abnormal) findings on radiological and other examination of gastrointestinal tract 05/19/2011   OSA (obstructive sleep apnea) 02/09/2017   Pain and swelling of toe of right foot 07/15/2016   Palpitations 06/02/2016   Paraesophageal hernia 10/30/2019   Formatting of this note might be different from the original. Added automatically from request for surgery [id number]   Peritoneal mesothelioma ([place]) 01/16/2020   S/P lumbar fusion 03/26/2020   Shortness of breath    on  excertion   Sleep apnea    Past Surgical History:  Procedure Laterality Date   ABDOMINAL HYSTERECTOMY     ABDOMINAL HYSTERECTOMY     BREAST SURGERY     begin mass,left  breast   COLONOSCOPY  08/17/2015   Colonic polyp status post polypectomy. Mild pancolonic diverticulosis. Highly redundant colon.    DILATION AND CURETTAGE OF UTERUS     one   ESOPHAGOGASTRODUODENOSCOPY  08/17/2015   Moderate hiatal hernia. Otherwise noraml EGD.    ESOPHAGOGASTRODUODENOSCOPY  09/03/2019   [place]   EUS  05/19/2011   Procedure: UPPER ENDOSCOPIC ULTRASOUND (EUS) LINEAR;  Surgeon: Owens Loffler, MD;  Location: [facility];  Service: Endoscopy;  Laterality: N/A;  radial linear    HERNIA REPAIR  05/09/2020   HYSTEROTOMY     KNEE SURGERY     LEFT HEART CATH AND CORONARY ANGIOGRAPHY     LEFT HEART CATH AND CORONARY ANGIOGRAPHY N/A 05/08/2019   Procedure: LEFT HEART CATH AND CORONARY ANGIOGRAPHY;  Surgeon: Burnell Blanks, MD;  Location: [facility];  Service: Cardiovascular;  Laterality: N/A;   right thumb     tendon repaire   RIGHT/LEFT HEART CATH AND CORONARY ANGIOGRAPHY N/A 06/23/2020   Procedure: RIGHT/LEFT HEART CATH AND CORONARY ANGIOGRAPHY;  Surgeon: Martinique, Peter M, MD;  Location: [facility];  Service: Cardiovascular;  Laterality: N/A;   SHOULDER SURGERY     rotator cuff repair   TRANSFORAMINAL LUMBAR INTERBODY FUSION L4-5   02/27/2020   WRIST SURGERY     rigth   Current  Facility-Administered Medications  Medication Dose Route Frequency Provider Last Rate Last Admin   0.9 %  sodium chloride infusion   Intravenous Continuous Mansouraty, Telford Nab., MD        Current Facility-Administered Medications:    0.9 %  sodium chloride infusion, , Intravenous, Continuous, Mansouraty, Telford Nab., MD Allergies  Allergen Reactions   Penicillins Rash    Reaction was 30years ago  Has never taken again Reaction was 30years ago  Has never taken again Has patient had a PCN reaction causing anaphylaxis, immediate rash, facial/tongue/throat swelling, SOB or lightheadedness with hypotension? no Has patient had a PCN reaction causing severe rash involving mucus membranes or skin necrosis?  no Has patient had a PCN reaction that required hospitilization?no  Has patient had a PCN reaction occurring within the last 10 years?  no If all of the above answers are "no" then may proceed with cephalosporin use   Ampicillin Hives   Tramadol Other (See Comments)    Hallunications     Family History  Problem Relation Age of Onset   Diabetes Sister    Cancer Sister    Hypertension Daughter    Colon cancer Neg Hx    Liver disease Neg Hx    Pancreatic cancer Neg Hx    Esophageal cancer Neg Hx    Social History   Socioeconomic History   Marital status: Single    Spouse name: Not on file   Number of children: 3   Years of education: Not on file   Highest education level: Not on file  Occupational History   Occupation: retired  Tobacco Use   Smoking status: Former    Packs/day: 1.00    Types: Cigarettes    Quit date: 05/17/2011    Years since quitting: 9.8   Smokeless tobacco: Never  Vaping Use   Vaping Use: Never used  Substance and Sexual Activity   Alcohol use: No   Drug use: No   Sexual activity: Never  Other Topics Concern   Not on file  Social History Narrative   Right handed   Social Determinants of Health   Financial Resource Strain: Not on file  Food  Insecurity: Not on file  Transportation Needs: Not on file  Physical Activity: Not on file  Stress: Not on file  Social Connections: Not on file  Intimate Partner Violence: Not on file    Physical Exam: There were no vitals filed for this visit. There is no height or weight on file to calculate BMI. GEN: NAD EYE: Sclerae anicteric ENT: MMM CV: Non-tachycardic GI: Soft, NT/ND NEURO:  Alert & Oriented x 3  Lab Results: No results for input(s): WBC, HGB, HCT, PLT in the last 72 hours. BMET No results for input(s): NA, K, CL, CO2, GLUCOSE, BUN, CREATININE, CALCIUM in the last 72 hours. LFT No results for input(s): PROT, ALBUMIN, AST, ALT, ALKPHOS, BILITOT, BILIDIR, IBILI in the last 72 hours. PT/INR No results for input(s): LABPROT, INR in the  last 72 hours.   Impression / Plan: This is a 75 y.o.female who presents for EGD with EMR for gastric polyps resection in setting of iron deficiency anemia.  Based upon the description and endoscopic pictures I do feel that it is reasonable to pursue an Advanced Polypectomy attempt of the polyp/lesion.  We discussed some of the techniques of advanced polypectomy which include Endoscopic Mucosal Resection, OVESCO Full-Thickness Resection, Endorotor Morcellation, and Tissue Ablation via Fulguration.  We also reviewed images of typical techniques as noted above.  The risks and benefits of endoscopic evaluation were discussed with the patient; these include but are not limited to the risk of perforation, infection, bleeding, missed lesions, lack of diagnosis, severe illness requiring hospitalization, as well as anesthesia and sedation related illnesses.  During attempts at advanced resection, the risks of bleeding and perforation/leak are increased as opposed to diagnostic and screening procedures, and that was discussed with the patient as well.   In addition, I explained that with the possible need for piecemeal resection, subsequent short-interval  endoscopic evaluation for follow up and potential retreatment of the lesion/area may be necessary.  If, after attempt at removal of the polyp/lesion, it is found that the patient has a complication or that an invasive lesion or malignant lesion is found, or that the polyp/lesion continues to recur, the patient is aware and understands that surgery may still be indicated/required.  All patient questions were answered, to the best of my ability, and the patient agrees to the aforementioned plan of action with follow-up as indicated.   The risks and benefits of endoscopic evaluation/treatment were discussed with the patient and/or family; these include but are not limited to the risk of perforation, infection, bleeding, missed lesions, lack of diagnosis, severe illness requiring hospitalization, as well as anesthesia and sedation related illnesses.  The patient's history has been reviewed, patient examined, no change in status, and deemed stable for procedure.  The patient and/or family is agreeable to proceed.    Justice Britain, MD [facility] Advanced Endoscopy Office # [phone]

## 2021-03-22 NOTE — Transfer of Care (Signed)
Immediate Anesthesia Transfer of Care Note  Patient: Deborah Hutchinson  Procedure(s) Performed: ESOPHAGOGASTRODUODENOSCOPY (EGD) WITH PROPOFOL ENDOSCOPIC MUCOSAL RESECTION SUBMUCOSAL LIFTING INJECTION HEMOSTASIS CLIP PLACEMENT BIOPSY  Patient Location: PACU and Endoscopy Unit  Anesthesia Type:General  Level of Consciousness: awake, alert  and oriented  Airway & Oxygen Therapy: Patient Spontanous Breathing and Patient connected to face mask  Post-op Assessment: Report given to RN and Post -op Vital signs reviewed and stable  Post vital signs: Reviewed and stable  Last Vitals:  Vitals Value Taken Time  BP 143/55 03/22/21 1411  Temp    Pulse 75 03/22/21 1413  Resp 16 03/22/21 1413  SpO2 100 % 03/22/21 1413  Vitals shown include unvalidated device data.  Last Pain:  Vitals:   03/22/21 1258  TempSrc: Oral  PainSc: 0-No pain         Complications: No notable events documented.

## 2021-03-22 NOTE — Op Note (Signed)
[facility] Patient Name: Deborah Hutchinson Procedure Date: 03/22/2021 MRN: [id number] Attending MD: Justice Britain , MD Date of Birth: February 21, 1946 CSN: [id number] Age: 75 Admit Type: Outpatient Procedure:                Upper GI endoscopy Indications:              Iron deficiency anemia, Follow-up of esophagitis,                            For therapy of gastric polyps, Personal history of                            upper GI endoscopy Providers:                Justice Britain, MD, Kary Kos RN, RN,                            Lodema Hong Technician, Technician, Cletis Athens,                            Technician, Margurite Auerbach Referring MD:             Jackquline Denmark, MD, [name] Medicines:                General Anesthesia Complications:            No immediate complications. Estimated Blood Loss:     Estimated blood loss was minimal. Procedure:                Pre-Anesthesia Assessment:                           - Prior to the procedure, a History and Physical                            was performed, and patient medications and                            allergies were reviewed. The patient's tolerance of                            previous anesthesia was also reviewed. The risks                            and benefits of the procedure and the sedation                            options and risks were discussed with the patient.                            All questions were answered, and informed consent                            was obtained. Prior Anticoagulants: The patient  last took Plavix (clopidogrel) 5 days prior to the                            procedure and has taken no previous anticoagulant                            or antiplatelet agents except for aspirin. ASA                            Grade Assessment: III - A patient with severe                            systemic disease. After reviewing the risks and                             benefits, the patient was deemed in satisfactory                            condition to undergo the procedure.                           After obtaining informed consent, the endoscope was                            passed under direct vision. Throughout the                            procedure, the patient's blood pressure, pulse, and                            oxygen saturations were monitored continuously. The                            [id number] ([id number] Olympus therapeutic endoscope                            was introduced through the mouth, and advanced to                            the second part of duodenum. The upper GI endoscopy                            was accomplished without difficulty. The patient                            tolerated the procedure. Scope In: Scope Out: Findings:      No gross lesions were noted in the proximal esophagus and in the mid       esophagus.      LA Grade A (one or more mucosal breaks less than 5 mm, not extending       between tops of 2 mucosal folds) esophagitis with no bleeding was found       in the distal esophagus - this is improved from prior EGD report.  The Z-line was irregular and was found 34 cm from the incisors.      A 3 cm hiatal hernia was present.      Four 8 to 15 mm pedunculated and sessile polyps with no bleeding and       stigmata of recent bleeding were found in the cardia, on the greater       curvature of the gastric body and on the lesser curvature of the gastric       body. Preparations were made for mucosal resections. NBI imaging and       White-light endoscopy was done to demarcate the borders of each lesion.       Orise gel was injected to raise the lesions sequentially. Snare mucosal       resection was performed. Resection and retrieval were complete. To       prevent bleeding after mucosal resection, five hemostatic clips were       successfully placed (MR conditional) 1/1/2/1 clips for each  resection       site. There was no bleeding during, or at the end, of the procedure.      Patchy moderate inflammation characterized by erythema, friability and       granularity was found in the entire examined stomach. Biopsies were       taken with a cold forceps for histology and Helicobacter pylori testing.      No gross lesions were noted in the duodenal bulb, in the first portion       of the duodenum and in the second portion of the duodenum. Impression:               - No gross lesions in esophagus proximally. LA                            Grade A esophagitis with no bleeding distally (this                            is improved from prior).                           - Z-line irregular, 34 cm from the incisors.                           - 3 cm hiatal hernia.                           - Four gastric polyps. Resected and retrieved.                            Clips (MR conditional) were placed.                           - Gastritis. Biopsied.                           - No gross lesions in the duodenal bulb, in the                            first portion of the duodenum and in the second  portion of the duodenum. Moderate Sedation:      Not Applicable - Patient had care per Anesthesia. Recommendation:           - The patient will be observed post-procedure,                            until all discharge criteria are met.                           - Discharge patient to home.                           - Patient has a contact number available for                            emergencies. The signs and symptoms of potential                            delayed complications were discussed with the                            patient. Return to normal activities tomorrow.                            Written discharge instructions were provided to the                            patient.                           - Full liquid diet today.                           -  Increase to Pantoprazole 40 mg twice daily for                            77-monththen may decrease back to 40 mg daily if                            symptomatically she is doing well.                           - Plavix restart on 11/16 PM or 11/17 AM (depending                            on when she normally takes her Plavix).                           - Continue present medications.                           - Await pathology results.                           - Follow up with Dr. GLyndel Safefor Iron studies/indices.                           -  Recommend if patient continues to have IDA then                            consider VCE vs EGD/VCE pending clinical status.                           - The findings and recommendations were discussed                            with the patient.                           - The findings and recommendations were discussed                            with the patient's family. Procedure Code(s):        --- Professional ---                           [phone], Esophagogastroduodenoscopy, flexible,                            transoral; with endoscopic mucosal resection Diagnosis Code(s):        --- Professional ---                           K20.90, Esophagitis, unspecified without bleeding                           K22.8, Other specified diseases of esophagus                           K44.9, Diaphragmatic hernia without obstruction or                            gangrene                           K31.7, Polyp of stomach and duodenum                           K29.70, Gastritis, unspecified, without bleeding                           D50.9, Iron deficiency anemia, unspecified                           Z98.890, Other specified postprocedural states CPT copyright 2019 American Medical Association. All rights reserved. The codes documented in this report are preliminary and upon coder review may  be revised to meet current compliance requirements. Justice Britain,  MD 03/22/2021 2:11:20 PM Number of Addenda: 0

## 2021-03-22 NOTE — Anesthesia Procedure Notes (Signed)
Procedure Name: Intubation Date/Time: 03/22/2021 1:26 PM Performed by: Rosaland Lao, CRNA Pre-anesthesia Checklist: Patient identified, Emergency Drugs available, Suction available and Patient being monitored Patient Re-evaluated:Patient Re-evaluated prior to induction Oxygen Delivery Method: Circle system utilized Preoxygenation: Pre-oxygenation with 100% oxygen Induction Type: IV induction Ventilation: Oral airway inserted - appropriate to patient size and Mask ventilation without difficulty Laryngoscope Size: Miller and 2 Grade View: Grade I Tube type: Oral Tube size: 7.0 mm Number of attempts: 1 Airway Equipment and Method: Stylet and Oral airway Placement Confirmation: ETT inserted through vocal cords under direct vision, positive ETCO2 and breath sounds checked- equal and bilateral Secured at: 22 cm Tube secured with: Tape Dental Injury: Teeth and Oropharynx as per pre-operative assessment

## 2021-03-22 NOTE — Anesthesia Preprocedure Evaluation (Addendum)
Anesthesia Evaluation  Patient identified by MRN, date of birth, ID band Patient awake    Reviewed: Allergy & Precautions, NPO status , Patient's Chart, lab work & pertinent test results  Airway Mallampati: II  TM Distance: >3 FB Neck ROM: Full    Dental no notable dental hx. (+) Edentulous Upper, Edentulous Lower   Pulmonary shortness of breath and with exertion, sleep apnea , Current Smoker, former smoker,  DOE for a couple years   Pulmonary exam normal breath sounds clear to auscultation       Cardiovascular hypertension, Pt. on medications + angina + CAD  Normal cardiovascular exam+ Valvular Problems/Murmurs  Rhythm:Regular Rate:Normal  Cardiac Cath 2/22 Prox LAD lesion is 40% stenosed. Prox RCA lesion is 50% stenosed. The left ventricular systolic function is normal. LV end diastolic pressure is normal. The left ventricular ejection fraction is 55-65% by visual estimate. There is mild aortic valve stenosis.    Neuro/Psych  Headaches, Anxiety negative psych ROS   GI/Hepatic Neg liver ROS, hiatal hernia, GERD  Medicated,  Endo/Other  negative endocrine ROSdiabetes, Type 2  Renal/GU negative Renal ROS  negative genitourinary   Musculoskeletal negative musculoskeletal ROS (+)   Abdominal   Peds negative pediatric ROS (+)  Hematology  (+) Blood dyscrasia, anemia ,   Anesthesia Other Findings   Reproductive/Obstetrics negative OB ROS                             Anesthesia Physical  Anesthesia Plan  ASA: 3  Anesthesia Plan: General   Post-op Pain Management:    Induction: Intravenous  PONV Risk Score and Plan: 2 and TIVA  Airway Management Planned: Oral ETT  Additional Equipment: None  Intra-op Plan:   Post-operative Plan:   Informed Consent: I have reviewed the patients History and Physical, chart, labs and discussed the procedure including the risks, benefits and  alternatives for the proposed anesthesia with the patient or authorized representative who has indicated his/her understanding and acceptance.     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

## 2021-03-23 LAB — SURGICAL PATHOLOGY

## 2021-03-24 ENCOUNTER — Encounter: Payer: Self-pay | Admitting: Gastroenterology

## 2021-03-24 ENCOUNTER — Encounter (HOSPITAL_COMMUNITY): Payer: Self-pay | Admitting: Gastroenterology

## 2021-03-25 ENCOUNTER — Other Ambulatory Visit: Payer: Self-pay

## 2021-03-25 ENCOUNTER — Telehealth: Payer: Self-pay | Admitting: Gastroenterology

## 2021-03-25 ENCOUNTER — Other Ambulatory Visit (INDEPENDENT_AMBULATORY_CARE_PROVIDER_SITE_OTHER): Payer: Medicare HMO

## 2021-03-25 DIAGNOSIS — K921 Melena: Secondary | ICD-10-CM

## 2021-03-25 LAB — CBC WITH DIFFERENTIAL/PLATELET
Basophils Absolute: 0.1 10*3/uL (ref 0.0–0.1)
Basophils Relative: 1.2 % (ref 0.0–3.0)
Eosinophils Absolute: 0.1 10*3/uL (ref 0.0–0.7)
Eosinophils Relative: 1.6 % (ref 0.0–5.0)
HCT: 36.4 % (ref 36.0–46.0)
Hemoglobin: 11.8 g/dL — ABNORMAL LOW (ref 12.0–15.0)
Lymphocytes Relative: 21.9 % (ref 12.0–46.0)
Lymphs Abs: 1.5 10*3/uL (ref 0.7–4.0)
MCHC: 32.6 g/dL (ref 30.0–36.0)
MCV: 90.3 fl (ref 78.0–100.0)
Monocytes Absolute: 0.5 10*3/uL (ref 0.1–1.0)
Monocytes Relative: 7.6 % (ref 3.0–12.0)
Neutro Abs: 4.5 10*3/uL (ref 1.4–7.7)
Neutrophils Relative %: 67.7 % (ref 43.0–77.0)
Platelets: 364 10*3/uL (ref 150.0–400.0)
RBC: 4.03 Mil/uL (ref 3.87–5.11)
RDW: 19.2 % — ABNORMAL HIGH (ref 11.5–15.5)
WBC: 6.7 10*3/uL (ref 4.0–10.5)

## 2021-03-25 LAB — BASIC METABOLIC PANEL
BUN: 21 mg/dL (ref 6–23)
CO2: 30 mEq/L (ref 19–32)
Calcium: 9.1 mg/dL (ref 8.4–10.5)
Chloride: 105 mEq/L (ref 96–112)
Creatinine, Ser: 1.31 mg/dL — ABNORMAL HIGH (ref 0.40–1.20)
GFR: 39.25 mL/min — ABNORMAL LOW (ref >60.00)
Glucose, Bld: 116 mg/dL — ABNORMAL HIGH (ref 70–99)
Potassium: 3.5 mEq/L (ref 3.5–5.1)
Sodium: 143 mEq/L (ref 135–145)

## 2021-03-25 LAB — PROTIME-INR
INR: 0.9 ratio (ref 0.8–1.0)
Prothrombin Time: 9.5 s — ABNORMAL LOW (ref 9.6–13.1)

## 2021-03-25 NOTE — Telephone Encounter (Signed)
Called Barnett Applebaum (daughter) and she will bring patient in this am for STAT labs. Called patient and she will hold today's Plavix

## 2021-03-25 NOTE — Telephone Encounter (Signed)
I called and spoke with the patient and patient's daughter this afternoon. I have reviewed her blood counts.  She has a hemoglobin of 11.8, which is the highest it has been in 2 years.  BUN is at 20 but she has had levels of this in the past as well. The patient has not had any further bowel movements since yesterday in the afternoon. I discussed the possibility of needing to come into the hospital to have a repeat endoscopy and reevaluate the gastric polyp sites. I also discussed the possibility, since her hemoglobin is the best it has been in years, to continue to do a watchful wait. After discussion about the risks and benefits the patient and daughter are okay with monitoring for any further bowel movements and potentially repeat evaluation and blood counts tomorrow.  Please call the patient tomorrow morning.  If they are doing and feeling well then I would recommend considering repeat CBC and BMP.  If the patient has a progressive decline in blood counts or progressive BUN elevation, then she likely needs to have a repeat endoscopy and will need to come into the hospital.  If the blood counts are stable and no significant changes and she is doing okay, then I think she can be monitored closely and wait on restart of Plavix until Saturday.  I will be away the rest of the week, but please reach out to Dr. Lyndel Safe or to the MD of the day if there are any further questions into Friday.  Thank you. GM

## 2021-03-25 NOTE — Telephone Encounter (Signed)
Oncall Note: Patient and her daughter called last night with c/o black tarry stool. She had 2 episodes yesterday. No abdominal pain, vomiting, fever or chest pain. She feels weak and has occasional lightheadedness.  She is s/p EGD with multiple gastric polyp removal on 03/22/21  Her symptoms are concerning for upper GI bleed, possible source from site of gastric polypectomies  Given the current ER situation, patient did not want to wait in the ER whole night.  Plan for stat CBC, BMP, PT/INR this am and if has significant drop in Hgb or any worsening symptoms, she will need to come to ER.   Patty, please call patient this morning to check and advise her to do stat labs. Thanks  Damaris Hippo , MD [phone]

## 2021-03-25 NOTE — Telephone Encounter (Signed)
Thanks everyone for help. Remo Lipps, Please let me know when you talk to patient's daughter tomorrow. RG

## 2021-03-25 NOTE — Telephone Encounter (Signed)
KVN, Thank you for the update. Covering RN for [employer], please reach out to the patient this morning.   Please have her come in for the laboratories stat this morning.  If the patient has any evidence of progressive anemia she will need to come into the emergency department.   I will be away the rest of this week but can reach Dr. Lyndel Safe if I have not seen or replied to the results. She should hold her Plavix today if she has not restarted it which was the plan to restart today until we see what the laboratories look like. If she is feeling progressively weak or worsening she should just come to the emergency department. Thanks. GM

## 2021-03-26 ENCOUNTER — Other Ambulatory Visit (INDEPENDENT_AMBULATORY_CARE_PROVIDER_SITE_OTHER): Payer: Medicare HMO

## 2021-03-26 ENCOUNTER — Other Ambulatory Visit: Payer: Self-pay

## 2021-03-26 DIAGNOSIS — K921 Melena: Secondary | ICD-10-CM | POA: Diagnosis not present

## 2021-03-26 DIAGNOSIS — J301 Allergic rhinitis due to pollen: Secondary | ICD-10-CM | POA: Diagnosis not present

## 2021-03-26 LAB — CBC WITH DIFFERENTIAL/PLATELET
Basophils Absolute: 0 10*3/uL (ref 0.0–0.1)
Basophils Relative: 0.7 % (ref 0.0–3.0)
Eosinophils Absolute: 0.1 10*3/uL (ref 0.0–0.7)
Eosinophils Relative: 2.5 % (ref 0.0–5.0)
HCT: 36.8 % (ref 36.0–46.0)
Hemoglobin: 11.9 g/dL — ABNORMAL LOW (ref 12.0–15.0)
Lymphocytes Relative: 26.7 % (ref 12.0–46.0)
Lymphs Abs: 1.5 10*3/uL (ref 0.7–4.0)
MCHC: 32.4 g/dL (ref 30.0–36.0)
MCV: 90 fl (ref 78.0–100.0)
Monocytes Absolute: 0.5 10*3/uL (ref 0.1–1.0)
Monocytes Relative: 9.5 % (ref 3.0–12.0)
Neutro Abs: 3.3 10*3/uL (ref 1.4–7.7)
Neutrophils Relative %: 60.6 % (ref 43.0–77.0)
Platelets: 379 10*3/uL (ref 150.0–400.0)
RBC: 4.08 Mil/uL (ref 3.87–5.11)
RDW: 18.5 % — ABNORMAL HIGH (ref 11.5–15.5)
WBC: 5.5 10*3/uL (ref 4.0–10.5)

## 2021-03-26 LAB — BASIC METABOLIC PANEL
BUN: 11 mg/dL (ref 6–23)
CO2: 29 mEq/L (ref 19–32)
Calcium: 9.7 mg/dL (ref 8.4–10.5)
Chloride: 105 mEq/L (ref 96–112)
Creatinine, Ser: 0.94 mg/dL (ref 0.40–1.20)
GFR: 58.45 mL/min — ABNORMAL LOW (ref >60.00)
Glucose, Bld: 115 mg/dL — ABNORMAL HIGH (ref 70–99)
Potassium: 3.1 mEq/L — ABNORMAL LOW (ref 3.5–5.1)
Sodium: 142 mEq/L (ref 135–145)

## 2021-03-26 NOTE — Telephone Encounter (Signed)
Pt states that she is feeling good today and is "feeling pretty strong" Daughter Felicita Gage confirmed that pt is feeling good today. Labs were placed in Epic. Barnett Applebaum stated that they would come by this afternoon to have labs drawn.  Barnett Applebaum Verbalized understanding with all questions answered.

## 2021-03-29 ENCOUNTER — Other Ambulatory Visit: Payer: Self-pay

## 2021-03-29 ENCOUNTER — Telehealth: Payer: Self-pay | Admitting: Cardiology

## 2021-03-29 DIAGNOSIS — T7840XA Allergy, unspecified, initial encounter: Secondary | ICD-10-CM | POA: Insufficient documentation

## 2021-03-29 DIAGNOSIS — E088 Diabetes mellitus due to underlying condition with unspecified complications: Secondary | ICD-10-CM

## 2021-03-29 DIAGNOSIS — I2510 Atherosclerotic heart disease of native coronary artery without angina pectoris: Secondary | ICD-10-CM

## 2021-03-29 HISTORY — DX: Allergy, unspecified, initial encounter: T78.40XA

## 2021-03-29 NOTE — Telephone Encounter (Signed)
*  STAT* If patient is at the pharmacy, call can be transferred to refill team.   1. Which medications need to be refilled? (please list name of each medication and dose if known) rosuvastatin (CRESTOR) 20 MG tablet (Expired)  2. Which pharmacy/location (including street and city if local pharmacy) is medication to be sent to? [place], [place]  3. Do they need a 30 day or 90 day supply? [place]   PT HAVE BEEN OUT OF THIS MEDICINE FOR OVER 2 MONTHS

## 2021-03-30 MED ORDER — ROSUVASTATIN CALCIUM 20 MG PO TABS: 20.0000 mg | ORAL_TABLET | Freq: Every day | ORAL | 2 refills | Status: DC

## 2021-03-31 ENCOUNTER — Ambulatory Visit (INDEPENDENT_AMBULATORY_CARE_PROVIDER_SITE_OTHER): Payer: Medicare HMO | Admitting: Cardiology

## 2021-03-31 ENCOUNTER — Encounter: Payer: Self-pay | Admitting: Cardiology

## 2021-03-31 ENCOUNTER — Other Ambulatory Visit: Payer: Self-pay

## 2021-03-31 VITALS — BP 126/84 | HR 77 | Ht 65.5 in | Wt 170.6 lb

## 2021-03-31 DIAGNOSIS — I10 Essential (primary) hypertension: Secondary | ICD-10-CM | POA: Diagnosis not present

## 2021-03-31 DIAGNOSIS — E088 Diabetes mellitus due to underlying condition with unspecified complications: Secondary | ICD-10-CM | POA: Diagnosis not present

## 2021-03-31 DIAGNOSIS — E782 Mixed hyperlipidemia: Secondary | ICD-10-CM | POA: Diagnosis not present

## 2021-03-31 DIAGNOSIS — J301 Allergic rhinitis due to pollen: Secondary | ICD-10-CM | POA: Diagnosis not present

## 2021-03-31 DIAGNOSIS — I350 Nonrheumatic aortic (valve) stenosis: Secondary | ICD-10-CM | POA: Diagnosis not present

## 2021-03-31 DIAGNOSIS — I2510 Atherosclerotic heart disease of native coronary artery without angina pectoris: Secondary | ICD-10-CM

## 2021-03-31 NOTE — Progress Notes (Signed)
Cardiology Office Note:    Date:  03/31/2021   ID:  Deborah Hutchinson, DOB February 10, 1946, MRN [id number]  PCP:  [name], MD  Cardiologist:  [name], MD   Referring MD: [name], MD    ASSESSMENT:    1. Coronary artery disease involving native coronary artery of native heart without angina pectoris   2. Essential hypertension   3. Mixed hyperlipidemia   4. Moderate aortic stenosis   5. Diabetes mellitus due to underlying condition with unspecified complications ([place])    PLAN:    In order of problems listed above:  Coronary artery disease: Secondary prevention stressed with the patient.  Importance of compliance with diet medication stressed and she vocalized understanding.  She was advised to walk to the best of her ability.  She ambulates with a cane. Essential hypertension: Primary prevention stressed with the patient.  Importance of compliance with diet medication stressed and she vocalized understanding.  Her blood pressure stable today.  She will keep a blood pressure log from home and bring her blood pressure machine to her doctor's office visit next time to make sure that the machine is doing right. Mixed dyslipidemia: Lipids were reviewed.  Diet was emphasized and she is compliant. Diabetes mellitus: Stable at this time.  Managed by primary care.  Diet emphasized. Mild to moderate aortic stenosis: Murmur on auscultation.  We will do a follow-up echocardiogram since last echo was in 2020. Patient will be seen in follow-up appointment in 6 months or earlier if the patient has any concerns    Medication Adjustments/Labs and Tests Ordered: Current medicines are reviewed at length with the patient today.  Concerns regarding medicines are outlined above.  No orders of the defined types were placed in this encounter.  No orders of the defined types were placed in this encounter.    Chief Complaint  Patient presents with   Chest Pain     History of Present  Illness:    Deborah Hutchinson is a 75 y.o. female.  Patient has past medical history of mild to moderate aortic stenosis, coronary artery disease, essential hypertension, dyslipidemia and diabetes mellitus.  She denies any problems at this time and takes care of activities of daily living.  No chest pain orthopnea or PND.  At the time of my evaluation, the patient is alert awake oriented and in no distress.  She mentions to me that her blood pressure is high at times at home.  I requested her to bring her blood pressure machine to the doctor's office next time to make sure it is doing well.  Today her blood pressure is excellent.  Past Medical History:  Diagnosis Date   Aftercare following surgery 03/26/2020   Allergy    Angina pectoris ([place]) 06/17/2020   Anxiety    Arthritis    Asthma    Bronchitis    CAD (coronary artery disease) 02/13/2019   Chest pain 05/08/2019   Chest pain of uncertain etiology    Chronic bilateral low back pain without sciatica 07/11/2019   Formatting of this note might be different from the original. Added automatically from request for surgery [id number]   Chronic obstructive pulmonary disease ([place]) 02/09/2017   Formatting of this note might be different from the original. Last Assessment & Plan:  I will review her PFTs after I obtain records from her pulmonologist. Based on this we will decide if she needs inhalers, subjectively they do not seem to have helped her at  all. She may benefit from a pulmonary rehabilitation program Formatting of this note might be different from the original. Last Assessment    COPD (chronic obstructive pulmonary disease) ([place]) 02/09/2017   Coronary artery disease involving native coronary artery of native heart without angina pectoris 06/10/2015   Diabetes mellitus    Diabetes mellitus due to underlying condition with unspecified complications ([place]) [id number]   Dyslipidemia 06/10/2015   Esophageal dysphagia 09/02/2019   Formatting of  this note might be different from the original. Added automatically from request for surgery [id number]   Essential hypertension 06/10/2015   Gastroparesis 09/04/2019   GERD (gastroesophageal reflux disease)    H/O hiatal hernia    Headache 09/24/2018   Heart murmur    Hyperlipemia    Hypertension    Mixed dyslipidemia 06/10/2015   Moderate aortic stenosis 05/16/2019   Nausea and vomiting 06/16/2020   Nonspecific (abnormal) findings on radiological and other examination of gastrointestinal tract 05/19/2011   OSA (obstructive sleep apnea) 02/09/2017   Pain and swelling of toe of right foot 07/15/2016   Palpitations 06/02/2016   Paraesophageal hernia 10/30/2019   Formatting of this note might be different from the original. Added automatically from request for surgery [id number]   Peritoneal mesothelioma ([place]) 01/16/2020   S/P lumbar fusion 03/26/2020   Shortness of breath    on  excertion   Sleep apnea     Past Surgical History:  Procedure Laterality Date   ABDOMINAL HYSTERECTOMY     ABDOMINAL HYSTERECTOMY     BIOPSY  03/22/2021   Procedure: BIOPSY;  Surgeon: Irving Copas., MD;  Location: [facility];  Service: Gastroenterology;;   BREAST SURGERY     begin mass,left  breast   COLONOSCOPY  08/17/2015   Colonic polyp status post polypectomy. Mild pancolonic diverticulosis. Highly redundant colon.    DILATION AND CURETTAGE OF UTERUS     one   ENDOSCOPIC MUCOSAL RESECTION N/A 03/22/2021   Procedure: ENDOSCOPIC MUCOSAL RESECTION;  Surgeon: Rush Landmark Telford Nab., MD;  Location: [facility];  Service: Gastroenterology;  Laterality: N/A;   ESOPHAGOGASTRODUODENOSCOPY  08/17/2015   Moderate hiatal hernia. Otherwise [place]aml EGD.    ESOPHAGOGASTRODUODENOSCOPY  09/03/2019   [place]   ESOPHAGOGASTRODUODENOSCOPY (EGD) WITH PROPOFOL N/A 03/22/2021   Procedure: ESOPHAGOGASTRODUODENOSCOPY (EGD) WITH PROPOFOL;  Surgeon: Rush Landmark Telford Nab., MD;  Location: [facility];  Service: Gastroenterology;  Laterality: N/A;   EUS  05/19/2011   Procedure: UPPER ENDOSCOPIC ULTRASOUND (EUS) LINEAR;  Surgeon: Owens Loffler, MD;  Location: [facility];  Service: Endoscopy;  Laterality: N/A;  radial linear    HEMOSTASIS CLIP PLACEMENT  03/22/2021   Procedure: HEMOSTASIS CLIP PLACEMENT;  Surgeon: Irving Copas., MD;  Location: [facility];  Service: Gastroenterology;;   HERNIA REPAIR  05/09/2020   HYSTEROTOMY     KNEE SURGERY     LEFT HEART CATH AND CORONARY ANGIOGRAPHY     LEFT HEART CATH AND CORONARY ANGIOGRAPHY N/A 05/08/2019   Procedure: LEFT HEART CATH AND CORONARY ANGIOGRAPHY;  Surgeon: Burnell Blanks, MD;  Location: [facility];  Service: Cardiovascular;  Laterality: N/A;   right thumb     tendon repaire   RIGHT/LEFT HEART CATH AND CORONARY ANGIOGRAPHY N/A 06/23/2020   Procedure: RIGHT/LEFT HEART CATH AND CORONARY ANGIOGRAPHY;  Surgeon: Martinique, Peter M, MD;  Location: [facility];  Service: Cardiovascular;  Laterality: N/A;   SHOULDER SURGERY     rotator cuff repair   SUBMUCOSAL LIFTING INJECTION  03/22/2021  Procedure: SUBMUCOSAL LIFTING INJECTION;  Surgeon: Irving Copas., MD;  [facility]: [facility];  Service: Gastroenterology;;   TRANSFORAMINAL LUMBAR INTERBODY FUSION L4-5   02/27/2020   WRIST SURGERY     [place]    Current Medications: Current Meds  Medication Sig   albuterol (PROVENTIL HFA;VENTOLIN HFA) 108 (90 Base) MCG/ACT inhaler Inhale 2 puffs into the lungs every 6 (six) hours as needed for wheezing or shortness of breath.   azelastine (ASTELIN) 0.1 % nasal spray Use two sprays in each nostril twice daily as directed for nasal drainage.   baclofen (LIORESAL) 10 MG tablet Take 10 mg by mouth 2 (two) times daily.   buPROPion (WELLBUTRIN XL) 300 MG 24 hr tablet Take 300 mg by mouth daily.    canagliflozin (INVOKANA) 300 MG TABS tablet Take 300 mg by mouth daily.   celecoxib (CELEBREX) 100 MG capsule  Take 100 mg by mouth 2 (two) times daily.   Cholecalciferol (VITAMIN D3) 1.25 MG (50000 UT) CAPS Take 50,000 Units by mouth every 30 (thirty) days.   clopidogrel (PLAVIX) 75 MG tablet Take 1 tablet (75 mg total) by mouth daily.   diclofenac Sodium (VOLTAREN) 1 % GEL Apply 1 g topically as needed (Joint pain).   donepezil (ARICEPT) 10 MG tablet Take 10 mg by mouth daily at 12 noon.   EPINEPHrine 0.3 mg/0.3 mL IJ SOAJ injection Inject into the muscle as directed.   escitalopram (LEXAPRO) 20 MG tablet Take 20 mg by mouth daily.   famotidine (PEPCID) 20 MG tablet Take 20 mg by mouth in the morning and at bedtime.   Fluticasone-Umeclidin-Vilant (TRELEGY ELLIPTA) 200-62.5-25 MCG/INH AEPB Inhale 1 puff into the lungs daily.   gabapentin (NEURONTIN) 100 MG capsule Take 100 mg by mouth 3 (three) times daily.   isosorbide mononitrate (IMDUR) 120 MG 24 hr tablet Take 1 tablet by mouth once daily   levocetirizine (XYZAL) 5 MG tablet TAKE 1 TABLET BY MOUTH ONCE DAILY IN THE EVENING   lidocaine (LIDODERM) 5 % Place 1 patch onto the skin daily.   linaCLOtide (LINZESS PO) Take 1 tablet by mouth as needed (diarrhea).   liraglutide (VICTOZA) 18 MG/3ML SOPN Inject 1.8 mg into the skin daily.   losartan-hydrochlorothiazide (HYZAAR) 100-25 MG tablet Take 1 tablet by mouth daily.   metoCLOPramide (REGLAN) 10 MG tablet Take 10 mg by mouth 4 (four) times daily.   montelukast (SINGULAIR) 10 MG tablet Take 10 mg by mouth at bedtime.    Multiple Vitamin (MULTIVITAMIN) capsule Take 1 capsule by mouth daily.   nitroGLYCERIN (NITROSTAT) 0.4 MG SL tablet Place 0.4 mg under the tongue every 5 (five) minutes as needed for chest pain.   pantoprazole (PROTONIX) 40 MG tablet Take 1 tablet (40 mg total) by mouth 2 (two) times daily before a meal.   Potassium Chloride CR (MICRO-K) 8 MEQ CPCR capsule CR Take 8 mEq by mouth daily.   rosuvastatin (CRESTOR) 20 MG tablet Take 1 tablet (20 mg total) by mouth daily.     Allergies:    Penicillins, Ampicillin, and Tramadol   Social History   Socioeconomic History   Marital status: Single    Spouse name: Not on file   Number of children: 3   Years of education: Not on file   Highest education level: Not on file  Occupational History   Occupation: retired  Tobacco Use   Smoking status: Former    Packs/day: 1.00    Types: Cigarettes    Quit date: 05/17/2011  Years since quitting: 9.8   Smokeless tobacco: Never  Vaping Use   Vaping Use: Never used  Substance and Sexual Activity   Alcohol use: No   Drug use: No   Sexual activity: Never  Other Topics Concern   Not on file  Social History Narrative   Right handed   Social Determinants of Health   Financial Resource Strain: Not on file  Food Insecurity: Not on file  Transportation Needs: Not on file  Physical Activity: Not on file  Stress: Not on file  Social Connections: Not on file     Family History: The patient's family history includes Cancer in her sister; Diabetes in her sister; Hypertension in her daughter. There is no history of Colon cancer, Liver disease, Pancreatic cancer, or Esophageal cancer.  ROS:   Please see the history of present illness.    All other systems reviewed and are negative.  EKGs/Labs/Other Studies Reviewed:    The following studies were reviewed today: EKG reveals sinus rhythm and nonspecific ST-T changes   Recent Labs: 09/25/2020: ALT 19 03/26/2021: BUN 11; Creatinine, Ser 0.94; Hemoglobin 11.9; Platelets 379.0; Potassium 3.1; Sodium 142  Recent Lipid Panel    Component Value Date/Time   CHOL 159 02/03/2020 0937   TRIG 65 02/03/2020 0937   HDL 82 02/03/2020 0937   CHOLHDL 1.9 02/03/2020 0937   LDLCALC 64 02/03/2020 0937    Physical Exam:    VS:  BP 126/84 (BP Location: Left Arm, Patient Position: Sitting)   Pulse 77   Ht 5' 5.5" (1.664 m)   Wt 170 lb 9.6 oz (77.4 kg)   SpO2 98%   BMI 27.96 kg/m     Wt Readings from Last 3 Encounters:  03/31/21 170  lb 9.6 oz (77.4 kg)  03/22/21 162 lb (73.5 kg)  02/08/21 170 lb (77.1 kg)     GEN: Patient is in no acute distress HEENT: Normal NECK: No JVD; No carotid bruits LYMPHATICS: No lymphadenopathy CARDIAC: Hear sounds regular, 2/6 systolic murmur at the apex and 2 or 6 systolic murmur at the aortic area. RESPIRATORY:  Clear to auscultation without rales, wheezing or rhonchi  ABDOMEN: Soft, non-tender, non-distended MUSCULOSKELETAL:  No edema; No deformity  SKIN: Warm and dry NEUROLOGIC:  Alert and oriented x 3 PSYCHIATRIC:  Normal affect   Signed, [name], MD  03/31/2021 9:18 AM    [place]

## 2021-03-31 NOTE — Patient Instructions (Signed)
Medication Instructions:  Your physician recommends that you continue on your current medications as directed. Please refer to the Current Medication list given to you today.  *If you need a refill on your cardiac medications before your next appointment, please call your pharmacy*   Lab Work: Your physician recommends that you have BMET done today in the office.  If you have labs (blood work) drawn today and your tests are completely normal, you will receive your results only by: [place] (if you have MyChart) OR A paper copy in the mail If you have any lab test that is abnormal or we need to change your treatment, we will call you to review the results.   Testing/Procedures: Your physician has requested that you have an echocardiogram. Echocardiography is a painless test that uses sound waves to create images of your heart. It provides your doctor with information about the size and shape of your heart and how well your heart's chambers and valves are working. This procedure takes approximately one hour. There are no restrictions for this procedure.    Follow-Up: At [facility], you and your health needs are our priority.  As part of our continuing mission to provide you with exceptional heart care, we have created designated Provider Care Teams.  These Care Teams include your primary Cardiologist (physician) and Advanced Practice Providers (APPs -  Physician Assistants and Nurse Practitioners) who all work together to provide you with the care you need, when you need it.  We recommend signing up for the patient portal called "MyChart".  Sign up information is provided on this After Visit Summary.  MyChart is used to connect with patients for Virtual Visits (Telemedicine).  Patients are able to view lab/test results, encounter notes, upcoming appointments, etc.  Non-urgent messages can be sent to your provider as well.   To learn more about what you can do with MyChart, go to  [email or web address].    Your next appointment:   6 month(s)  The format for your next appointment:   In Person  Provider:   Jyl Heinz, MD   Other Instructions Echocardiogram An echocardiogram is a test that uses sound waves (ultrasound) to produce images of the heart. Images from an echocardiogram can provide important information about: Heart size and shape. The size and thickness and movement of your heart's walls. Heart muscle function and strength. Heart valve function or if you have stenosis. Stenosis is when the heart valves are too narrow. If blood is flowing backward through the heart valves (regurgitation). A tumor or infectious growth around the heart valves. Areas of heart muscle that are not working well because of poor blood flow or injury from a heart attack. Aneurysm detection. An aneurysm is a weak or damaged part of an artery wall. The wall bulges out from the normal force of blood pumping through the body. Tell a health care provider about: Any allergies you have. All medicines you are taking, including vitamins, herbs, eye drops, creams, and over-the-counter medicines. Any blood disorders you have. Any surgeries you have had. Any medical conditions you have. Whether you are pregnant or may be pregnant. What are the risks? Generally, this is a safe test. However, problems may occur, including an allergic reaction to dye (contrast) that may be used during the test. What happens before the test? No specific preparation is needed. You may eat and drink normally. What happens during the test? You will take off your clothes from the waist up and put on  a hospital gown. Electrodes or electrocardiogram (ECG)patches may be placed on your chest. The electrodes or patches are then connected to a device that monitors your heart rate and rhythm. You will lie down on a table for an ultrasound exam. A gel will be applied to your chest to help sound waves pass  through your skin. A handheld device, called a transducer, will be pressed against your chest and moved over your heart. The transducer produces sound waves that travel to your heart and bounce back (or "echo" back) to the transducer. These sound waves will be captured in real-time and changed into images of your heart that can be viewed on a video monitor. The images will be recorded on a computer and reviewed by your health care provider. You may be asked to change positions or hold your breath for a short time. This makes it easier to get different views or better views of your heart. In some cases, you may receive contrast through an IV in one of your veins. This can improve the quality of the pictures from your heart. The procedure may vary among health care providers and hospitals.   What can I expect after the test? You may return to your normal, everyday life, including diet, activities, and medicines, unless your health care provider tells you not to do that. Follow these instructions at home: It is up to you to get the results of your test. Ask your health care provider, or the department that is doing the test, when your results will be ready. Keep all follow-up visits. This is important. Summary An echocardiogram is a test that uses sound waves (ultrasound) to produce images of the heart. Images from an echocardiogram can provide important information about the size and shape of your heart, heart muscle function, heart valve function, and other possible heart problems. You do not need to do anything to prepare before this test. You may eat and drink normally. After the echocardiogram is completed, you may return to your normal, everyday life, unless your health care provider tells you not to do that. This information is not intended to replace advice given to you by your health care provider. Make sure you discuss any questions you have with your health care provider. Document Revised:  12/17/2019 Document Reviewed: 12/17/2019 Elsevier Patient Education  2021 [employer].

## 2021-04-01 LAB — BASIC METABOLIC PANEL
BUN/Creatinine Ratio: 10 — ABNORMAL LOW (ref 12–28)
BUN: 11 mg/dL (ref 8–27)
CO2: 22 mmol/L (ref 20–29)
Calcium: 10.2 mg/dL (ref 8.7–10.3)
Chloride: 102 mmol/L (ref 96–106)
Creatinine, Ser: 1.12 mg/dL — ABNORMAL HIGH (ref 0.57–1.00)
Glucose: 101 mg/dL — ABNORMAL HIGH (ref 70–99)
Potassium: 4.3 mmol/L (ref 3.5–5.2)
Sodium: 140 mmol/L (ref 134–144)
eGFR: 51 mL/min/{1.73_m2} — ABNORMAL LOW (ref >59)

## 2021-04-06 ENCOUNTER — Telehealth: Payer: Self-pay

## 2021-04-06 DIAGNOSIS — M65341 Trigger finger, right ring finger: Secondary | ICD-10-CM | POA: Diagnosis not present

## 2021-04-06 DIAGNOSIS — J301 Allergic rhinitis due to pollen: Secondary | ICD-10-CM | POA: Diagnosis not present

## 2021-04-06 NOTE — Telephone Encounter (Signed)
Called patient. Patient made aware of the lab results. Verbalized understanding. No questions or concerns expressed at this time.  Results forward to PCP 

## 2021-04-06 NOTE — Telephone Encounter (Signed)
-----   Message from [name], MD sent at 04/05/2021  9:07 AM EST ----- The results of the study is unremarkable. Please inform patient. I will discuss in detail at next appointment. Cc  primary care/referring physician [name], MD 04/05/2021 9:07 AM

## 2021-04-07 DIAGNOSIS — J449 Chronic obstructive pulmonary disease, unspecified: Secondary | ICD-10-CM | POA: Diagnosis not present

## 2021-04-07 DIAGNOSIS — E785 Hyperlipidemia, unspecified: Secondary | ICD-10-CM | POA: Diagnosis not present

## 2021-04-07 DIAGNOSIS — I2510 Atherosclerotic heart disease of native coronary artery without angina pectoris: Secondary | ICD-10-CM | POA: Diagnosis not present

## 2021-04-07 DIAGNOSIS — I10 Essential (primary) hypertension: Secondary | ICD-10-CM | POA: Diagnosis not present

## 2021-04-08 ENCOUNTER — Other Ambulatory Visit: Payer: Self-pay | Admitting: Neurology

## 2021-04-08 DIAGNOSIS — H9313 Tinnitus, bilateral: Secondary | ICD-10-CM

## 2021-04-08 DIAGNOSIS — R519 Headache, unspecified: Secondary | ICD-10-CM

## 2021-04-09 DIAGNOSIS — M65341 Trigger finger, right ring finger: Secondary | ICD-10-CM | POA: Diagnosis not present

## 2021-04-09 DIAGNOSIS — M65342 Trigger finger, left ring finger: Secondary | ICD-10-CM | POA: Diagnosis not present

## 2021-04-09 DIAGNOSIS — J449 Chronic obstructive pulmonary disease, unspecified: Secondary | ICD-10-CM | POA: Diagnosis not present

## 2021-04-09 DIAGNOSIS — Z7985 Long-term (current) use of injectable non-insulin antidiabetic drugs: Secondary | ICD-10-CM | POA: Diagnosis not present

## 2021-04-09 DIAGNOSIS — E119 Type 2 diabetes mellitus without complications: Secondary | ICD-10-CM | POA: Diagnosis not present

## 2021-04-09 DIAGNOSIS — I10 Essential (primary) hypertension: Secondary | ICD-10-CM | POA: Diagnosis not present

## 2021-04-20 ENCOUNTER — Other Ambulatory Visit: Payer: Self-pay

## 2021-04-20 ENCOUNTER — Ambulatory Visit (INDEPENDENT_AMBULATORY_CARE_PROVIDER_SITE_OTHER): Payer: Medicare HMO

## 2021-04-20 DIAGNOSIS — I350 Nonrheumatic aortic (valve) stenosis: Secondary | ICD-10-CM

## 2021-04-20 LAB — ECHOCARDIOGRAM COMPLETE
AR max vel: 1.79 cm2
AV Area VTI: 1.77 cm2
AV Area mean vel: 1.94 cm2
AV Mean grad: 18 mmHg
AV Peak grad: 33.9 mmHg
Ao pk vel: 2.91 m/s
Area-P 1/2: 2.85 cm2
MV VTI: 2.3 cm2
S' Lateral: 2 cm

## 2021-04-21 DIAGNOSIS — Z1231 Encounter for screening mammogram for malignant neoplasm of breast: Secondary | ICD-10-CM | POA: Diagnosis not present

## 2021-04-21 DIAGNOSIS — J301 Allergic rhinitis due to pollen: Secondary | ICD-10-CM | POA: Diagnosis not present

## 2021-04-22 ENCOUNTER — Telehealth: Payer: Self-pay

## 2021-04-22 NOTE — Telephone Encounter (Signed)
Patient notified of results, results faxed to PCP

## 2021-04-22 NOTE — Telephone Encounter (Signed)
-----   Message from [name], MD sent at 04/20/2021  7:10 PM EST ----- Mild to moderate aortic stenosis.  Medical management at this time.  We will continue to monitor.  Copy primary care [name], MD 04/20/2021 7:09 PM

## 2021-04-26 DIAGNOSIS — S52102A Unspecified fracture of upper end of left radius, initial encounter for closed fracture: Secondary | ICD-10-CM | POA: Diagnosis not present

## 2021-04-26 DIAGNOSIS — M25551 Pain in right hip: Secondary | ICD-10-CM | POA: Diagnosis not present

## 2021-04-26 DIAGNOSIS — S52502A Unspecified fracture of the lower end of left radius, initial encounter for closed fracture: Secondary | ICD-10-CM | POA: Diagnosis not present

## 2021-04-26 DIAGNOSIS — S62307A Unspecified fracture of fifth metacarpal bone, left hand, initial encounter for closed fracture: Secondary | ICD-10-CM | POA: Diagnosis not present

## 2021-04-26 DIAGNOSIS — M25561 Pain in right knee: Secondary | ICD-10-CM | POA: Diagnosis not present

## 2021-04-26 DIAGNOSIS — Z7902 Long term (current) use of antithrombotics/antiplatelets: Secondary | ICD-10-CM | POA: Diagnosis not present

## 2021-04-26 DIAGNOSIS — E119 Type 2 diabetes mellitus without complications: Secondary | ICD-10-CM | POA: Diagnosis not present

## 2021-04-26 DIAGNOSIS — S8001XA Contusion of right knee, initial encounter: Secondary | ICD-10-CM | POA: Diagnosis not present

## 2021-04-26 DIAGNOSIS — J45909 Unspecified asthma, uncomplicated: Secondary | ICD-10-CM | POA: Diagnosis not present

## 2021-04-26 DIAGNOSIS — S5292XA Unspecified fracture of left forearm, initial encounter for closed fracture: Secondary | ICD-10-CM | POA: Diagnosis not present

## 2021-04-26 DIAGNOSIS — S8002XA Contusion of left knee, initial encounter: Secondary | ICD-10-CM | POA: Diagnosis not present

## 2021-04-26 DIAGNOSIS — S52352A Displaced comminuted fracture of shaft of radius, left arm, initial encounter for closed fracture: Secondary | ICD-10-CM | POA: Diagnosis not present

## 2021-04-26 DIAGNOSIS — I10 Essential (primary) hypertension: Secondary | ICD-10-CM | POA: Diagnosis not present

## 2021-04-26 DIAGNOSIS — M1812 Unilateral primary osteoarthritis of first carpometacarpal joint, left hand: Secondary | ICD-10-CM | POA: Diagnosis not present

## 2021-04-26 DIAGNOSIS — S52592A Other fractures of lower end of left radius, initial encounter for closed fracture: Secondary | ICD-10-CM | POA: Diagnosis not present

## 2021-04-27 ENCOUNTER — Telehealth: Payer: Self-pay | Admitting: Cardiology

## 2021-04-27 NOTE — Telephone Encounter (Signed)
No answer. RX is filled by PCP.

## 2021-04-27 NOTE — Telephone Encounter (Signed)
°*  STAT* If patient is at the pharmacy, call can be transferred to refill team.   1. Which medications need to be refilled? (please list name of each medication and dose if known)  Potassium Chloride CR (MICRO-K) 8 MEQ CPCR capsule CR  2. Which pharmacy/location (including street and city if local pharmacy) is medication to be sent to? [place], [place]  3. Do they need a 30 day or 90 day supply? 90 day supply

## 2021-04-28 DIAGNOSIS — S52532A Colles' fracture of left radius, initial encounter for closed fracture: Secondary | ICD-10-CM | POA: Diagnosis not present

## 2021-04-28 NOTE — Telephone Encounter (Signed)
Left VM for pt to call PCP for refill.

## 2021-04-30 DIAGNOSIS — J301 Allergic rhinitis due to pollen: Secondary | ICD-10-CM | POA: Diagnosis not present

## 2021-05-05 DIAGNOSIS — S52532A Colles' fracture of left radius, initial encounter for closed fracture: Secondary | ICD-10-CM | POA: Diagnosis not present

## 2021-05-05 DIAGNOSIS — J301 Allergic rhinitis due to pollen: Secondary | ICD-10-CM | POA: Diagnosis not present

## 2021-05-07 DIAGNOSIS — J449 Chronic obstructive pulmonary disease, unspecified: Secondary | ICD-10-CM | POA: Diagnosis not present

## 2021-05-07 DIAGNOSIS — I10 Essential (primary) hypertension: Secondary | ICD-10-CM | POA: Diagnosis not present

## 2021-05-07 DIAGNOSIS — E785 Hyperlipidemia, unspecified: Secondary | ICD-10-CM | POA: Diagnosis not present

## 2021-05-07 NOTE — Progress Notes (Signed)
[facility]  [address] [place],  [place]  [address] [phone]  Clinic Day:  05/14/2021  Referring physician: [name], MD  This document serves as a record of services personally performed by Marice Potter, MD. It was created on their behalf by [facility] E, a trained medical scribe. The creation of this record is based on the scribe's personal observations and the provider's statements to them.  HISTORY OF PRESENT ILLNESS:  The patient is a 75 y.o. female with recurrent iron deficiency anemia.  Furthermore, a component of her anemia is related to chronic renal insufficiency.  She comes in today to reassess her iron and hemoglobin levels after recently receiving another course of IV iron.  After last visit, the patient has been doing okay.  However, she has had some dizzy spells which recently led to her falling and fracturing her left wrist.. She denies having any overt forms of blood loss since her last visit.  Of note, the patient has been diagnosed with peritoneal mesothelioma, which was incidentally found as she was undergoing hiatal hernia surgery.  As pathology showed it to be low grade, she is being followed conservatively at [facility], who is seeing her on annual basis.     VITALS:  Blood pressure 131/71, pulse 78, temperature 98.2 F (36.8 C), resp. rate 16, height 5' 5.5" (1.664 m), weight 169 lb (76.7 kg), SpO2 96 %.  Wt Readings from Last 3 Encounters:  05/14/21 169 lb (76.7 kg)  03/31/21 170 lb 9.6 oz (77.4 kg)  03/22/21 162 lb (73.5 kg)    Body mass index is 27.7 kg/m.  Performance status (ECOG): 1  PHYSICAL EXAM:  Physical Exam Constitutional:      General: She is not in acute distress.    Appearance: Normal appearance. She is normal weight.  HENT:     Head: Normocephalic and atraumatic.  Eyes:     General: No scleral icterus.    Extraocular Movements: Extraocular movements intact.      Conjunctiva/sclera: Conjunctivae normal.     Pupils: Pupils are equal, round, and reactive to light.  Cardiovascular:     Rate and Rhythm: Normal rate and regular rhythm.     Pulses: Normal pulses.     Heart sounds: Normal heart sounds. No murmur heard.   No friction rub. No gallop.  Pulmonary:     Effort: Pulmonary effort is normal. No respiratory distress.     Breath sounds: Normal breath sounds.  Abdominal:     General: Bowel sounds are normal. There is no distension.     Palpations: Abdomen is soft. There is no hepatomegaly, splenomegaly or mass.     Tenderness: There is no abdominal tenderness.  Musculoskeletal:        General: Normal range of motion.     Cervical back: Normal range of motion and neck supple.     Right lower leg: No edema.     Left lower leg: No edema.  Lymphadenopathy:     Cervical: No cervical adenopathy.  Skin:    General: Skin is warm and dry.  Neurological:     General: No focal deficit present.     Mental Status: She is alert and oriented to person, place, and time. Mental status is at baseline.  Psychiatric:        Mood and Affect: Mood normal.        Behavior: Behavior normal.        Thought  Content: Thought content normal.        Judgment: Judgment normal.    LABS:    Latest Reference Range & Units 05/14/21 00:00  WBC  5.7  RBC 3.87 - 5.11  4.03  Hemoglobin 12.0 - 16.0  11.4 !  HCT 36 - 46  35 !  MCV  87  Platelets 150 - 399  432 !  NEUT#  3.53    Latest Reference Range & Units 05/14/21 10:01  Iron 28 - 170 ug/dL 85  UIBC ug/dL 316  TIBC 250 - 450 ug/dL 401  Saturation Ratios 10.4 - 31.8 % 21  Ferritin 11 - 307 ng/mL 28   ASSESSMENT & PLAN:  Assessment/Plan:  A 75 year old woman with recurrent iron deficiency anemia.  I am pleased as there has been improvement in both her iron and hemoglobin levels since receiving her most recent course of IV iron.  Despite this, her hemoglobin remains mildly low, which suggests her renal  insufficiency is also playing a role into her anemia.  However, as she is better from a hematologic standpoint, she will be followed conservatively over these next few months.  I will see her back in 4 months for repeat clinical assessment. The patient understands all the plans discussed today and is in agreement with them.  I, Rita Ohara, am acting as scribe for Marice Potter, MD    I have reviewed this report as typed by the medical scribe, and it is complete and accurate.  Shadd Dunstan Macarthur Critchley, MD

## 2021-05-10 DIAGNOSIS — H524 Presbyopia: Secondary | ICD-10-CM | POA: Diagnosis not present

## 2021-05-10 DIAGNOSIS — H2513 Age-related nuclear cataract, bilateral: Secondary | ICD-10-CM | POA: Diagnosis not present

## 2021-05-10 DIAGNOSIS — H43819 Vitreous degeneration, unspecified eye: Secondary | ICD-10-CM | POA: Diagnosis not present

## 2021-05-10 DIAGNOSIS — E113293 Type 2 diabetes mellitus with mild nonproliferative diabetic retinopathy without macular edema, bilateral: Secondary | ICD-10-CM | POA: Diagnosis not present

## 2021-05-10 DIAGNOSIS — H18413 Arcus senilis, bilateral: Secondary | ICD-10-CM | POA: Diagnosis not present

## 2021-05-11 ENCOUNTER — Other Ambulatory Visit: Payer: Self-pay | Admitting: Gastroenterology

## 2021-05-11 ENCOUNTER — Other Ambulatory Visit: Payer: Self-pay

## 2021-05-11 MED ORDER — POTASSIUM CHLORIDE ER 8 MEQ PO CPCR: 8.0000 meq | ORAL_CAPSULE | Freq: Every day | ORAL | 3 refills | Status: DC

## 2021-05-12 DIAGNOSIS — J301 Allergic rhinitis due to pollen: Secondary | ICD-10-CM | POA: Diagnosis not present

## 2021-05-14 ENCOUNTER — Other Ambulatory Visit: Payer: Self-pay | Admitting: Oncology

## 2021-05-14 ENCOUNTER — Inpatient Hospital Stay: Payer: Medicare HMO | Attending: Oncology

## 2021-05-14 ENCOUNTER — Inpatient Hospital Stay (INDEPENDENT_AMBULATORY_CARE_PROVIDER_SITE_OTHER): Payer: Medicare HMO | Admitting: Oncology

## 2021-05-14 ENCOUNTER — Encounter: Payer: Self-pay | Admitting: Oncology

## 2021-05-14 VITALS — BP 131/71 | HR 78 | Temp 98.2°F | Resp 16 | Ht 65.5 in | Wt 169.0 lb

## 2021-05-14 DIAGNOSIS — D508 Other iron deficiency anemias: Secondary | ICD-10-CM | POA: Diagnosis not present

## 2021-05-14 DIAGNOSIS — N189 Chronic kidney disease, unspecified: Secondary | ICD-10-CM | POA: Diagnosis not present

## 2021-05-14 DIAGNOSIS — D631 Anemia in chronic kidney disease: Secondary | ICD-10-CM | POA: Diagnosis not present

## 2021-05-14 DIAGNOSIS — D509 Iron deficiency anemia, unspecified: Secondary | ICD-10-CM | POA: Insufficient documentation

## 2021-05-14 LAB — IRON AND TIBC
Iron: 85 ug/dL (ref 28–170)
Saturation Ratios: 21 % (ref 10.4–31.8)
TIBC: 401 ug/dL (ref 250–450)
UIBC: 316 ug/dL

## 2021-05-14 LAB — CBC AND DIFFERENTIAL
HCT: 35 — AB (ref 36–46)
Hemoglobin: 11.4 — AB (ref 12.0–16.0)
Neutrophils Absolute: 3.53
Platelets: 432 — AB (ref 150–399)
WBC: 5.7

## 2021-05-14 LAB — HEPATIC FUNCTION PANEL
ALT: 37 — AB (ref 7–35)
AST: 34 (ref 13–35)
Alkaline Phosphatase: 104 (ref 25–125)
Bilirubin, Total: 0.3

## 2021-05-14 LAB — BASIC METABOLIC PANEL
BUN: 17 (ref 4–21)
CO2: 26 — AB (ref 13–22)
Chloride: 104 (ref 99–108)
Creatinine: 1.2 — AB (ref 0.5–1.1)
Glucose: 140
Potassium: 3 — AB (ref 3.4–5.3)
Sodium: 139 (ref 137–147)

## 2021-05-14 LAB — CBC: RBC: 4.03 (ref 3.87–5.11)

## 2021-05-14 LAB — COMPREHENSIVE METABOLIC PANEL
Albumin: 3.9 (ref 3.5–5.0)
Calcium: 9.2 (ref 8.7–10.7)

## 2021-05-14 LAB — FERRITIN: Ferritin: 28 ng/mL (ref 11–307)

## 2021-05-17 ENCOUNTER — Telehealth: Payer: Self-pay | Admitting: Cardiology

## 2021-05-17 DIAGNOSIS — N3281 Overactive bladder: Secondary | ICD-10-CM | POA: Diagnosis not present

## 2021-05-17 DIAGNOSIS — M25473 Effusion, unspecified ankle: Secondary | ICD-10-CM | POA: Diagnosis not present

## 2021-05-17 DIAGNOSIS — K219 Gastro-esophageal reflux disease without esophagitis: Secondary | ICD-10-CM | POA: Diagnosis not present

## 2021-05-17 DIAGNOSIS — J309 Allergic rhinitis, unspecified: Secondary | ICD-10-CM | POA: Diagnosis not present

## 2021-05-17 DIAGNOSIS — Z6828 Body mass index (BMI) 28.0-28.9, adult: Secondary | ICD-10-CM | POA: Diagnosis not present

## 2021-05-17 DIAGNOSIS — R413 Other amnesia: Secondary | ICD-10-CM | POA: Diagnosis not present

## 2021-05-17 DIAGNOSIS — E663 Overweight: Secondary | ICD-10-CM | POA: Diagnosis not present

## 2021-05-17 DIAGNOSIS — I10 Essential (primary) hypertension: Secondary | ICD-10-CM | POA: Diagnosis not present

## 2021-05-17 DIAGNOSIS — M159 Polyosteoarthritis, unspecified: Secondary | ICD-10-CM | POA: Diagnosis not present

## 2021-05-17 NOTE — Telephone Encounter (Signed)
Called patient and verified that her potassium prescription was put through to the pharmacy on 1/3.

## 2021-05-17 NOTE — Telephone Encounter (Signed)
New Message:      Please call patient, she saays she need to talk to you about her Potassium. She says she have not taken her Potassium in about  4 months.

## 2021-05-26 DIAGNOSIS — J301 Allergic rhinitis due to pollen: Secondary | ICD-10-CM | POA: Diagnosis not present

## 2021-05-30 ENCOUNTER — Other Ambulatory Visit: Payer: Self-pay | Admitting: Allergy

## 2021-05-31 ENCOUNTER — Telehealth: Payer: Self-pay

## 2021-05-31 NOTE — Telephone Encounter (Signed)
Pt notified of below and verbalized understanding. I confirmed next appt for May 5,2023.   Latest Reference Range & Units 05/14/21 10:01  Iron 28 - 170 ug/dL 85  UIBC ug/dL 316  TIBC 250 - 450 ug/dL 401  Saturation Ratios 10.4 - 31.8 % 21  Ferritin 11 - 307 ng/mL 28    ASSESSMENT & PLAN:  Assessment/Plan:  A 76 year old woman with recurrent iron deficiency anemia.  I am pleased as there has been improvement in both her iron and hemoglobin levels since receiving her most recent course of IV iron.  Despite this, her hemoglobin remains mildly low, which suggests her renal insufficiency is also playing a role into her anemia.  However, as she is better from a hematologic standpoint, she will be followed conservatively over these next few months.  I will see her back in 4 months for repeat clinical assessment. The patient understands all the plans discussed today and is in agreement with them.   I, Rita Ohara, am acting as scribe for Marice Potter, MD     I have reviewed this report as typed by the medical scribe, and it is complete and accurate.   Dequincy Macarthur Critchley, MD

## 2021-06-01 DIAGNOSIS — G8929 Other chronic pain: Secondary | ICD-10-CM | POA: Diagnosis not present

## 2021-06-01 DIAGNOSIS — M5416 Radiculopathy, lumbar region: Secondary | ICD-10-CM | POA: Diagnosis not present

## 2021-06-01 DIAGNOSIS — Z981 Arthrodesis status: Secondary | ICD-10-CM | POA: Diagnosis not present

## 2021-06-01 DIAGNOSIS — M533 Sacrococcygeal disorders, not elsewhere classified: Secondary | ICD-10-CM | POA: Diagnosis not present

## 2021-06-04 DIAGNOSIS — J301 Allergic rhinitis due to pollen: Secondary | ICD-10-CM | POA: Diagnosis not present

## 2021-06-08 DIAGNOSIS — E785 Hyperlipidemia, unspecified: Secondary | ICD-10-CM | POA: Diagnosis not present

## 2021-06-08 DIAGNOSIS — J449 Chronic obstructive pulmonary disease, unspecified: Secondary | ICD-10-CM | POA: Diagnosis not present

## 2021-06-08 DIAGNOSIS — I10 Essential (primary) hypertension: Secondary | ICD-10-CM | POA: Diagnosis not present

## 2021-06-09 DIAGNOSIS — Z8616 Personal history of COVID-19: Secondary | ICD-10-CM | POA: Diagnosis not present

## 2021-06-09 DIAGNOSIS — R5383 Other fatigue: Secondary | ICD-10-CM | POA: Diagnosis not present

## 2021-06-09 DIAGNOSIS — J301 Allergic rhinitis due to pollen: Secondary | ICD-10-CM | POA: Diagnosis not present

## 2021-06-09 DIAGNOSIS — G4733 Obstructive sleep apnea (adult) (pediatric): Secondary | ICD-10-CM | POA: Diagnosis not present

## 2021-06-09 DIAGNOSIS — J4540 Moderate persistent asthma, uncomplicated: Secondary | ICD-10-CM | POA: Diagnosis not present

## 2021-06-10 DIAGNOSIS — M25551 Pain in right hip: Secondary | ICD-10-CM | POA: Diagnosis not present

## 2021-06-10 DIAGNOSIS — S52532A Colles' fracture of left radius, initial encounter for closed fracture: Secondary | ICD-10-CM | POA: Diagnosis not present

## 2021-06-14 DIAGNOSIS — M533 Sacrococcygeal disorders, not elsewhere classified: Secondary | ICD-10-CM | POA: Diagnosis not present

## 2021-06-14 DIAGNOSIS — G8929 Other chronic pain: Secondary | ICD-10-CM | POA: Diagnosis not present

## 2021-06-15 ENCOUNTER — Other Ambulatory Visit: Payer: Self-pay | Admitting: Cardiology

## 2021-06-15 DIAGNOSIS — I10 Essential (primary) hypertension: Secondary | ICD-10-CM

## 2021-06-15 DIAGNOSIS — I350 Nonrheumatic aortic (valve) stenosis: Secondary | ICD-10-CM

## 2021-06-15 DIAGNOSIS — E088 Diabetes mellitus due to underlying condition with unspecified complications: Secondary | ICD-10-CM

## 2021-06-15 DIAGNOSIS — I2510 Atherosclerotic heart disease of native coronary artery without angina pectoris: Secondary | ICD-10-CM

## 2021-06-15 DIAGNOSIS — E782 Mixed hyperlipidemia: Secondary | ICD-10-CM

## 2021-06-15 DIAGNOSIS — I209 Angina pectoris, unspecified: Secondary | ICD-10-CM

## 2021-06-21 ENCOUNTER — Other Ambulatory Visit: Payer: Self-pay | Admitting: Gastroenterology

## 2021-06-24 DIAGNOSIS — E278 Other specified disorders of adrenal gland: Secondary | ICD-10-CM | POA: Diagnosis not present

## 2021-06-24 DIAGNOSIS — J432 Centrilobular emphysema: Secondary | ICD-10-CM | POA: Diagnosis not present

## 2021-06-24 DIAGNOSIS — I700 Atherosclerosis of aorta: Secondary | ICD-10-CM | POA: Diagnosis not present

## 2021-06-24 DIAGNOSIS — Z122 Encounter for screening for malignant neoplasm of respiratory organs: Secondary | ICD-10-CM | POA: Diagnosis not present

## 2021-06-24 DIAGNOSIS — Z87891 Personal history of nicotine dependence: Secondary | ICD-10-CM | POA: Diagnosis not present

## 2021-07-01 DIAGNOSIS — H2511 Age-related nuclear cataract, right eye: Secondary | ICD-10-CM | POA: Diagnosis not present

## 2021-07-01 DIAGNOSIS — H18413 Arcus senilis, bilateral: Secondary | ICD-10-CM | POA: Diagnosis not present

## 2021-07-01 DIAGNOSIS — H25013 Cortical age-related cataract, bilateral: Secondary | ICD-10-CM | POA: Diagnosis not present

## 2021-07-01 DIAGNOSIS — H25043 Posterior subcapsular polar age-related cataract, bilateral: Secondary | ICD-10-CM | POA: Diagnosis not present

## 2021-07-01 DIAGNOSIS — E113293 Type 2 diabetes mellitus with mild nonproliferative diabetic retinopathy without macular edema, bilateral: Secondary | ICD-10-CM | POA: Diagnosis not present

## 2021-07-01 DIAGNOSIS — H2513 Age-related nuclear cataract, bilateral: Secondary | ICD-10-CM | POA: Diagnosis not present

## 2021-07-06 DIAGNOSIS — J449 Chronic obstructive pulmonary disease, unspecified: Secondary | ICD-10-CM | POA: Diagnosis not present

## 2021-07-06 DIAGNOSIS — I10 Essential (primary) hypertension: Secondary | ICD-10-CM | POA: Diagnosis not present

## 2021-07-06 DIAGNOSIS — E1140 Type 2 diabetes mellitus with diabetic neuropathy, unspecified: Secondary | ICD-10-CM | POA: Diagnosis not present

## 2021-07-07 ENCOUNTER — Other Ambulatory Visit: Payer: Self-pay

## 2021-07-07 DIAGNOSIS — J301 Allergic rhinitis due to pollen: Secondary | ICD-10-CM | POA: Diagnosis not present

## 2021-07-07 DIAGNOSIS — Z8616 Personal history of COVID-19: Secondary | ICD-10-CM | POA: Diagnosis not present

## 2021-07-07 DIAGNOSIS — R5383 Other fatigue: Secondary | ICD-10-CM | POA: Diagnosis not present

## 2021-07-07 DIAGNOSIS — G4733 Obstructive sleep apnea (adult) (pediatric): Secondary | ICD-10-CM | POA: Diagnosis not present

## 2021-07-07 DIAGNOSIS — J4540 Moderate persistent asthma, uncomplicated: Secondary | ICD-10-CM | POA: Diagnosis not present

## 2021-07-07 MED ORDER — POTASSIUM CHLORIDE ER 8 MEQ PO CPCR: 8.0000 meq | ORAL_CAPSULE | Freq: Every day | ORAL | 1 refills | Status: DC

## 2021-07-12 DIAGNOSIS — J45901 Unspecified asthma with (acute) exacerbation: Secondary | ICD-10-CM | POA: Diagnosis not present

## 2021-07-12 DIAGNOSIS — R0600 Dyspnea, unspecified: Secondary | ICD-10-CM | POA: Diagnosis not present

## 2021-07-13 DIAGNOSIS — J301 Allergic rhinitis due to pollen: Secondary | ICD-10-CM | POA: Diagnosis not present

## 2021-07-13 DIAGNOSIS — J4540 Moderate persistent asthma, uncomplicated: Secondary | ICD-10-CM | POA: Diagnosis not present

## 2021-07-13 DIAGNOSIS — R5383 Other fatigue: Secondary | ICD-10-CM | POA: Diagnosis not present

## 2021-07-13 DIAGNOSIS — G4733 Obstructive sleep apnea (adult) (pediatric): Secondary | ICD-10-CM | POA: Diagnosis not present

## 2021-07-13 DIAGNOSIS — Z8616 Personal history of COVID-19: Secondary | ICD-10-CM | POA: Diagnosis not present

## 2021-07-14 DIAGNOSIS — J4540 Moderate persistent asthma, uncomplicated: Secondary | ICD-10-CM | POA: Diagnosis not present

## 2021-07-14 DIAGNOSIS — J301 Allergic rhinitis due to pollen: Secondary | ICD-10-CM | POA: Diagnosis not present

## 2021-07-14 DIAGNOSIS — G4733 Obstructive sleep apnea (adult) (pediatric): Secondary | ICD-10-CM | POA: Diagnosis not present

## 2021-07-14 DIAGNOSIS — R5383 Other fatigue: Secondary | ICD-10-CM | POA: Diagnosis not present

## 2021-07-14 DIAGNOSIS — Z8616 Personal history of COVID-19: Secondary | ICD-10-CM | POA: Diagnosis not present

## 2021-07-14 DIAGNOSIS — Z981 Arthrodesis status: Secondary | ICD-10-CM | POA: Diagnosis not present

## 2021-07-14 DIAGNOSIS — M7060 Trochanteric bursitis, unspecified hip: Secondary | ICD-10-CM | POA: Diagnosis not present

## 2021-07-14 DIAGNOSIS — M5416 Radiculopathy, lumbar region: Secondary | ICD-10-CM | POA: Diagnosis not present

## 2021-07-14 DIAGNOSIS — M533 Sacrococcygeal disorders, not elsewhere classified: Secondary | ICD-10-CM | POA: Diagnosis not present

## 2021-07-14 DIAGNOSIS — G8929 Other chronic pain: Secondary | ICD-10-CM | POA: Diagnosis not present

## 2021-07-16 DIAGNOSIS — S52532A Colles' fracture of left radius, initial encounter for closed fracture: Secondary | ICD-10-CM | POA: Diagnosis not present

## 2021-07-21 ENCOUNTER — Telehealth: Payer: Self-pay | Admitting: Cardiology

## 2021-07-21 NOTE — Telephone Encounter (Signed)
Pt c/o medication issue: ? ?1. Name of Medication:  ?Potassium Chloride CR (MICRO-K) 8 MEQ CPCR capsule CR ? ?2. How are you currently taking this medication (dosage and times per day)?  ? ?3. Are you having a reaction (difficulty breathing--STAT)?  ? ?4. What is your medication issue?  ? ?Patient states she has been taking 2 capsules daily, but recent prescription instructs her to take 1 capsule daily. Please clarify how patient is to take medication. ? ?

## 2021-07-22 DIAGNOSIS — C451 Mesothelioma of peritoneum: Secondary | ICD-10-CM | POA: Diagnosis not present

## 2021-07-22 DIAGNOSIS — R918 Other nonspecific abnormal finding of lung field: Secondary | ICD-10-CM | POA: Diagnosis not present

## 2021-07-22 DIAGNOSIS — E278 Other specified disorders of adrenal gland: Secondary | ICD-10-CM | POA: Diagnosis not present

## 2021-07-22 NOTE — Telephone Encounter (Signed)
Left VM for pt to callback.  ? ?Potassium has always been 1 tablet daily. ?

## 2021-07-26 NOTE — Telephone Encounter (Signed)
Called patient and informed her that her Potassium medication has always been 1 tablet daily. Patient had no further questions. ?

## 2021-07-28 DIAGNOSIS — J301 Allergic rhinitis due to pollen: Secondary | ICD-10-CM | POA: Diagnosis not present

## 2021-08-04 ENCOUNTER — Other Ambulatory Visit: Payer: Self-pay | Admitting: Gastroenterology

## 2021-08-04 DIAGNOSIS — J301 Allergic rhinitis due to pollen: Secondary | ICD-10-CM | POA: Diagnosis not present

## 2021-08-11 DIAGNOSIS — J301 Allergic rhinitis due to pollen: Secondary | ICD-10-CM | POA: Diagnosis not present

## 2021-08-13 DIAGNOSIS — H43819 Vitreous degeneration, unspecified eye: Secondary | ICD-10-CM | POA: Diagnosis not present

## 2021-08-13 DIAGNOSIS — H18413 Arcus senilis, bilateral: Secondary | ICD-10-CM | POA: Diagnosis not present

## 2021-08-13 DIAGNOSIS — H2512 Age-related nuclear cataract, left eye: Secondary | ICD-10-CM | POA: Diagnosis not present

## 2021-08-13 DIAGNOSIS — E113293 Type 2 diabetes mellitus with mild nonproliferative diabetic retinopathy without macular edema, bilateral: Secondary | ICD-10-CM | POA: Diagnosis not present

## 2021-08-13 DIAGNOSIS — H2513 Age-related nuclear cataract, bilateral: Secondary | ICD-10-CM | POA: Diagnosis not present

## 2021-08-13 DIAGNOSIS — H2511 Age-related nuclear cataract, right eye: Secondary | ICD-10-CM | POA: Diagnosis not present

## 2021-08-13 DIAGNOSIS — H524 Presbyopia: Secondary | ICD-10-CM | POA: Diagnosis not present

## 2021-08-16 DIAGNOSIS — I10 Essential (primary) hypertension: Secondary | ICD-10-CM | POA: Diagnosis not present

## 2021-08-16 DIAGNOSIS — I2510 Atherosclerotic heart disease of native coronary artery without angina pectoris: Secondary | ICD-10-CM | POA: Diagnosis not present

## 2021-08-16 DIAGNOSIS — K219 Gastro-esophageal reflux disease without esophagitis: Secondary | ICD-10-CM | POA: Diagnosis not present

## 2021-08-16 DIAGNOSIS — E785 Hyperlipidemia, unspecified: Secondary | ICD-10-CM | POA: Diagnosis not present

## 2021-08-16 DIAGNOSIS — C451 Mesothelioma of peritoneum: Secondary | ICD-10-CM | POA: Diagnosis not present

## 2021-08-16 DIAGNOSIS — R413 Other amnesia: Secondary | ICD-10-CM | POA: Diagnosis not present

## 2021-08-16 DIAGNOSIS — E1140 Type 2 diabetes mellitus with diabetic neuropathy, unspecified: Secondary | ICD-10-CM | POA: Diagnosis not present

## 2021-08-16 DIAGNOSIS — M159 Polyosteoarthritis, unspecified: Secondary | ICD-10-CM | POA: Diagnosis not present

## 2021-08-16 DIAGNOSIS — M25473 Effusion, unspecified ankle: Secondary | ICD-10-CM | POA: Diagnosis not present

## 2021-08-18 DIAGNOSIS — E1140 Type 2 diabetes mellitus with diabetic neuropathy, unspecified: Secondary | ICD-10-CM | POA: Diagnosis not present

## 2021-08-18 DIAGNOSIS — Z79899 Other long term (current) drug therapy: Secondary | ICD-10-CM | POA: Diagnosis not present

## 2021-08-18 DIAGNOSIS — E785 Hyperlipidemia, unspecified: Secondary | ICD-10-CM | POA: Diagnosis not present

## 2021-08-18 DIAGNOSIS — E559 Vitamin D deficiency, unspecified: Secondary | ICD-10-CM | POA: Diagnosis not present

## 2021-08-18 DIAGNOSIS — J301 Allergic rhinitis due to pollen: Secondary | ICD-10-CM | POA: Diagnosis not present

## 2021-08-19 ENCOUNTER — Telehealth: Payer: Self-pay | Admitting: Gastroenterology

## 2021-08-19 NOTE — Telephone Encounter (Signed)
Says that she is having some dysphagia with her medications. She takes them 1 at a time but have to flush it down with a lot of liquids. She says she has eaten and not having any problems with swallowing foods. Any advice? ?

## 2021-08-19 NOTE — Telephone Encounter (Signed)
Inbound call from patient reports it is hard for her to swallow medicine  ?

## 2021-08-24 NOTE — Telephone Encounter (Signed)
I know her well ?She always had problems with medications ?As long as she is not having any problems swallowing food, we should be okay ?Please tell her to take all medicines in standing position ?Most recent iron studies were normal ?RG ?

## 2021-08-24 NOTE — Telephone Encounter (Signed)
Made pt aware and instructed to keep upcoming appointments and she can call with any questions ?

## 2021-08-25 DIAGNOSIS — J301 Allergic rhinitis due to pollen: Secondary | ICD-10-CM | POA: Diagnosis not present

## 2021-09-01 ENCOUNTER — Other Ambulatory Visit: Payer: Self-pay | Admitting: Neurology

## 2021-09-01 DIAGNOSIS — H9313 Tinnitus, bilateral: Secondary | ICD-10-CM

## 2021-09-01 DIAGNOSIS — J301 Allergic rhinitis due to pollen: Secondary | ICD-10-CM | POA: Diagnosis not present

## 2021-09-01 DIAGNOSIS — R519 Headache, unspecified: Secondary | ICD-10-CM

## 2021-09-03 DIAGNOSIS — M7061 Trochanteric bursitis, right hip: Secondary | ICD-10-CM | POA: Diagnosis not present

## 2021-09-03 DIAGNOSIS — M7062 Trochanteric bursitis, left hip: Secondary | ICD-10-CM | POA: Diagnosis not present

## 2021-09-05 DIAGNOSIS — J449 Chronic obstructive pulmonary disease, unspecified: Secondary | ICD-10-CM | POA: Diagnosis not present

## 2021-09-05 DIAGNOSIS — E785 Hyperlipidemia, unspecified: Secondary | ICD-10-CM | POA: Diagnosis not present

## 2021-09-05 DIAGNOSIS — I10 Essential (primary) hypertension: Secondary | ICD-10-CM | POA: Diagnosis not present

## 2021-09-08 DIAGNOSIS — J301 Allergic rhinitis due to pollen: Secondary | ICD-10-CM | POA: Diagnosis not present

## 2021-09-09 DIAGNOSIS — M79605 Pain in left leg: Secondary | ICD-10-CM | POA: Diagnosis not present

## 2021-09-09 DIAGNOSIS — D649 Anemia, unspecified: Secondary | ICD-10-CM | POA: Diagnosis not present

## 2021-09-09 DIAGNOSIS — G4700 Insomnia, unspecified: Secondary | ICD-10-CM | POA: Diagnosis not present

## 2021-09-10 ENCOUNTER — Other Ambulatory Visit: Payer: Medicare HMO

## 2021-09-10 ENCOUNTER — Ambulatory Visit: Payer: Medicare HMO | Admitting: Oncology

## 2021-09-10 DIAGNOSIS — H2512 Age-related nuclear cataract, left eye: Secondary | ICD-10-CM | POA: Diagnosis not present

## 2021-09-12 NOTE — Progress Notes (Signed)
?[facility]  ?[address] ?[place],  [place]  [address] ?[phone] ? ?Clinic Day:  09/13/2021 ? ?Referring physician: [name], MD ? ? ?HISTORY OF PRESENT ILLNESS:  ?The patient is a 76 y.o. female with recurrent iron deficiency anemia.  Furthermore, a component of her anemia is related to chronic renal insufficiency.  She comes in today to reassess her iron and hemoglobin levels after recently receiving another course of IV iron.  Over these past weeks, the patient has been feeling very tired.  Furthermore, she claims to have had multiple episodes of black stools over these past weeks.  This patient did undergo an EGD in May 2022 for which gastric polyps were apparently seen.  It was recommended that she have a repeat endoscopy to address these areas.  Other than the black stools, she denies having other overt forms of blood loss.  Of note, her peritoneal mesothelioma remains quiescent.  Recent scans done at [facility] continue to show no evidence of disease progression. ? ?VITALS:  ?Blood pressure (!) 188/85, pulse 70, temperature 98.4 ?F (36.9 ?C), resp. rate 16, height 5' 5.5" (1.664 m), weight 171 lb 11.2 oz (77.9 kg), SpO2 99 %.  ?Wt Readings from Last 3 Encounters:  ?09/13/21 171 lb 11.2 oz (77.9 kg)  ?05/14/21 169 lb (76.7 kg)  ?03/31/21 170 lb 9.6 oz (77.4 kg)  ?  ?Body mass index is 28.14 kg/m?. ? ?Performance status (ECOG): 1 ? ?PHYSICAL EXAM:  ?Physical Exam ?Constitutional:   ?   General: She is not in acute distress. ?   Appearance: Normal appearance. She is normal weight.  ?HENT:  ?   Head: Normocephalic and atraumatic.  ?Eyes:  ?   General: No scleral icterus. ?   Extraocular Movements: Extraocular movements intact.  ?   Conjunctiva/sclera: Conjunctivae normal.  ?   Pupils: Pupils are equal, round, and reactive to light.  ?Cardiovascular:  ?   Rate and Rhythm: Normal rate and regular rhythm.  ?   Pulses: Normal pulses.  ?   Heart  sounds: Normal heart sounds. No murmur heard. ?  No friction rub. No gallop.  ?Pulmonary:  ?   Effort: Pulmonary effort is normal. No respiratory distress.  ?   Breath sounds: Normal breath sounds.  ?Abdominal:  ?   General: Bowel sounds are normal. There is no distension.  ?   Palpations: Abdomen is soft. There is no hepatomegaly, splenomegaly or mass.  ?   Tenderness: There is no abdominal tenderness.  ?Musculoskeletal:     ?   General: Normal range of motion.  ?   Cervical back: Normal range of motion and neck supple.  ?   Right lower leg: No edema.  ?   Left lower leg: No edema.  ?Lymphadenopathy:  ?   Cervical: No cervical adenopathy.  ?Skin: ?   General: Skin is warm and dry.  ?Neurological:  ?   General: No focal deficit present.  ?   Mental Status: She is alert and oriented to person, place, and time. Mental status is at baseline.  ?Psychiatric:     ?   Mood and Affect: Mood normal.     ?   Behavior: Behavior normal.     ?   Thought Content: Thought content normal.     ?   Judgment: Judgment normal.  ? ? ?LABS:  ? ? Latest Reference Range & Units 09/13/21 00:00  ?WBC  4.6 (E)  ?  RBC 3.87 - 5.11  3.55 ! (E)  ?Hemoglobin 12.0 - 16.0  8.4 ! (E)  ?HCT 36 - 46  28 ! (E)  ?Platelets 150 - 400 K/uL 392 (E)  ?!: Data is abnormal ?(E): External lab result ? Latest Reference Range & Units 09/13/21 09:09  ?Iron 28 - 170 ug/dL 26 (L)  ?UIBC ug/dL 395  ?TIBC 250 - 450 ug/dL 421  ?Saturation Ratios 10.4 - 31.8 % 6 (L)  ?Ferritin 11 - 307 ng/mL 5 (L)  ?(L): Data is abnormally low ?ASSESSMENT & PLAN:  ?Assessment/Plan:  A 76 year old woman whose labs today clearly reflects recurrent iron deficiency anemia.  I will arrange for her to receive IV iron in the forthcoming days to rapidly replenish her iron stores and improve her hemoglobin.  As she has had black stools, I will have her see Dr. Lyndel Safe within the forthcoming weeks to have a repeat upper endoscopy to evaluate the underlying etiology behind her blood loss.  Otherwise,  I will see her back in 3 months to reassess her iron and hemoglobin levels after receiving her upcoming IV iron.  The patient understands all the plans discussed today and is in agreement with them. ? ? ?Azarion Hove Macarthur Critchley, MD ? ?

## 2021-09-13 ENCOUNTER — Inpatient Hospital Stay: Payer: Medicare HMO | Attending: Oncology | Admitting: Oncology

## 2021-09-13 ENCOUNTER — Ambulatory Visit: Payer: Medicare HMO | Admitting: Oncology

## 2021-09-13 ENCOUNTER — Other Ambulatory Visit: Payer: Self-pay | Admitting: Oncology

## 2021-09-13 ENCOUNTER — Inpatient Hospital Stay: Payer: Medicare HMO

## 2021-09-13 ENCOUNTER — Other Ambulatory Visit: Payer: Medicare HMO

## 2021-09-13 VITALS — BP 188/85 | HR 70 | Temp 98.4°F | Resp 16 | Ht 65.5 in | Wt 171.7 lb

## 2021-09-13 DIAGNOSIS — D509 Iron deficiency anemia, unspecified: Secondary | ICD-10-CM | POA: Diagnosis not present

## 2021-09-13 DIAGNOSIS — D631 Anemia in chronic kidney disease: Secondary | ICD-10-CM | POA: Diagnosis not present

## 2021-09-13 DIAGNOSIS — N189 Chronic kidney disease, unspecified: Secondary | ICD-10-CM | POA: Diagnosis not present

## 2021-09-13 DIAGNOSIS — D508 Other iron deficiency anemias: Secondary | ICD-10-CM

## 2021-09-13 DIAGNOSIS — D649 Anemia, unspecified: Secondary | ICD-10-CM | POA: Diagnosis not present

## 2021-09-13 LAB — BASIC METABOLIC PANEL
BUN: 12 (ref 4–21)
CO2: 30 — AB (ref 13–22)
Chloride: 105 (ref 99–108)
Creatinine: 1.2 — AB (ref 0.5–1.1)
Glucose: 95
Potassium: 3.8 mEq/L (ref 3.5–5.1)
Sodium: 141 (ref 137–147)

## 2021-09-13 LAB — COMPREHENSIVE METABOLIC PANEL
Albumin: 3.8 (ref 3.5–5.0)
Calcium: 8.9 (ref 8.7–10.7)

## 2021-09-13 LAB — CBC AND DIFFERENTIAL
HCT: 28 — AB (ref 36–46)
Hemoglobin: 8.4 — AB (ref 12.0–16.0)
Neutrophils Absolute: 2.25
Platelets: 392 10*3/uL (ref 150–400)
WBC: 4.6

## 2021-09-13 LAB — IRON AND TIBC
Iron: 26 ug/dL — ABNORMAL LOW (ref 28–170)
Saturation Ratios: 6 % — ABNORMAL LOW (ref 10.4–31.8)
TIBC: 421 ug/dL (ref 250–450)
UIBC: 395 ug/dL

## 2021-09-13 LAB — FERRITIN: Ferritin: 5 ng/mL — ABNORMAL LOW (ref 11–307)

## 2021-09-13 LAB — HEPATIC FUNCTION PANEL
ALT: 33 U/L (ref 7–35)
AST: 32 (ref 13–35)
Alkaline Phosphatase: 82 (ref 25–125)
Bilirubin, Total: 0.3

## 2021-09-13 LAB — CBC: RBC: 3.55 — AB (ref 3.87–5.11)

## 2021-09-14 ENCOUNTER — Encounter: Payer: Self-pay | Admitting: Oncology

## 2021-09-14 ENCOUNTER — Other Ambulatory Visit: Payer: Self-pay | Admitting: Gastroenterology

## 2021-09-14 ENCOUNTER — Other Ambulatory Visit: Payer: Self-pay | Admitting: Neurology

## 2021-09-14 DIAGNOSIS — H9313 Tinnitus, bilateral: Secondary | ICD-10-CM

## 2021-09-14 DIAGNOSIS — R519 Headache, unspecified: Secondary | ICD-10-CM

## 2021-09-20 ENCOUNTER — Other Ambulatory Visit: Payer: Self-pay | Admitting: Pharmacist

## 2021-09-20 MED FILL — Ferumoxytol Inj 510 MG/17ML (30 MG/ML) (Elemental Fe): INTRAVENOUS | Qty: 17 | Status: AC

## 2021-09-21 ENCOUNTER — Inpatient Hospital Stay: Payer: Medicare HMO

## 2021-09-21 VITALS — BP 117/62 | HR 65 | Temp 98.4°F | Resp 18 | Ht 65.5 in | Wt 171.5 lb

## 2021-09-21 DIAGNOSIS — N189 Chronic kidney disease, unspecified: Secondary | ICD-10-CM | POA: Diagnosis not present

## 2021-09-21 DIAGNOSIS — D631 Anemia in chronic kidney disease: Secondary | ICD-10-CM | POA: Diagnosis not present

## 2021-09-21 DIAGNOSIS — D508 Other iron deficiency anemias: Secondary | ICD-10-CM

## 2021-09-21 DIAGNOSIS — D509 Iron deficiency anemia, unspecified: Secondary | ICD-10-CM | POA: Diagnosis not present

## 2021-09-21 MED ORDER — SODIUM CHLORIDE 0.9 % IV SOLN
510.0000 mg | Freq: Once | INTRAVENOUS | Status: AC
  Administered 2021-09-21: 510 mg via INTRAVENOUS
  Filled 2021-09-21: qty 510

## 2021-09-21 MED ORDER — SODIUM CHLORIDE 0.9 % IV SOLN: Freq: Once | INTRAVENOUS | Status: AC

## 2021-09-21 NOTE — Patient Instructions (Signed)

## 2021-09-22 ENCOUNTER — Ambulatory Visit (INDEPENDENT_AMBULATORY_CARE_PROVIDER_SITE_OTHER): Payer: Medicare HMO | Admitting: Gastroenterology

## 2021-09-22 ENCOUNTER — Encounter: Payer: Self-pay | Admitting: Gastroenterology

## 2021-09-22 ENCOUNTER — Other Ambulatory Visit: Payer: Self-pay

## 2021-09-22 VITALS — BP 140/82 | HR 70 | Ht 65.5 in | Wt 173.1 lb

## 2021-09-22 DIAGNOSIS — D509 Iron deficiency anemia, unspecified: Secondary | ICD-10-CM | POA: Diagnosis not present

## 2021-09-22 DIAGNOSIS — K219 Gastro-esophageal reflux disease without esophagitis: Secondary | ICD-10-CM | POA: Diagnosis not present

## 2021-09-22 DIAGNOSIS — R195 Other fecal abnormalities: Secondary | ICD-10-CM

## 2021-09-22 DIAGNOSIS — K449 Diaphragmatic hernia without obstruction or gangrene: Secondary | ICD-10-CM | POA: Diagnosis not present

## 2021-09-22 DIAGNOSIS — J301 Allergic rhinitis due to pollen: Secondary | ICD-10-CM | POA: Diagnosis not present

## 2021-09-22 MED ORDER — PANTOPRAZOLE SODIUM 40 MG PO TBEC: 40.0000 mg | DELAYED_RELEASE_TABLET | Freq: Two times a day (BID) | ORAL | 5 refills | Status: DC

## 2021-09-22 NOTE — Progress Notes (Signed)
? ? ? ? ?IMPRESSION and PLAN:   ? ?#1.  Recurrent symptomatic IDA with H+ stools. ?CT chest Abdo/pelvis March 2023 shows peritoneal mesothelioma to be quiescent.  Majority of the hyperplastic gastric polyps were removed on EGD 03/2021 with Dr. Rush Landmark.  She had negative colonoscopy 12/2018 ? ?#2. GERD with large HH s/p repair Nov 2021 with small residual/recurrent hernia on EGD with reflux esophagitis. ? ?#3. Peritoneal mesothelioma (low grade) at time of [facility] repair. Stable. Last CT chest/A/P March 2023 without progression. ? ? ? ?Plan: ?-Continue Protonix '40mg'$  po BID #90, 4 refills. Once better, QD ?-Pepcid 20 mg p.o. QHS ?-Hemoccult cards x 3. ?-VCE ?-If needed, rpt EGD at [facility] after plavix washout ?-RTC 12 weeks. ?-D/W pt and daughter. ?-If any active bleeding, they will promptly get in touch with [place]. ?HPI:   ? ?Chief Complaint:  ? ?Deborah Hutchinson is a 76 y.o. female with CAD on plavix, DM2, HH, OSA, COPD, LBP d/t OA, HTN, HLD ? ?With recurrent symptomatic IDA despite IV iron  ?Hb 8.4 with ferritin 5, saturation 6% 09/13/2021  ?S/p another iron infusion yesterday  ?Seen as an emergency workin ? ?Had dark stools previously.  Note he she has been on p.o. iron as well. ? ?Most recent CT chest Abdo/pelvis March 2023 shows peritoneal mesothelioma to be quiescent.  Majority of the hyperplastic gastric polyps were removed on EGD 03/2021 with Dr. Rush Landmark.  She had negative colonoscopy 12/2018 as below. ? ?Had heartburn, postprandial abdominal bloating without nausea/vomiting.  No abdominal pain.  No diarrhea or constipation.  She denies having any weight loss. ? ?Continues to be Plavix ? ?Also has mild renal insufficiency. ? ?With H+ stools, likely d/t Mod sized gastric polyps (hyperplastic) in setting of Plavix for CAD. Neg colon 11/2020 ? ?No pica to ice.  No over-the-counter nonsteroidals. ? ? ? ?Previous notes: ?S/P HH repair by Dr. Abran Richard November 2021. Was found to have low-grade mesothelioma.  She is  closely being followed by Dr. Clovis Riley.  No worsening. CT Abdo/pelvis March 2022, 07/2021 as below which did not show any progression. ? ? ? ? ?Past GI procedures: ? ?CT chest/AP 07/2021 ?1.  No significant change in appearance of thickening of the diaphragm status post paraesophageal hernia repair. No definite new peritoneal disease or diaphragmatic nodularity identified.  ?2.  Similar groundglass opacities/nodules throughout the lungs bilaterally.  ?3.  Similar bilateral adrenal nodules. Can consider MR imaging for further characterization as clinically indicated. ? ?EGD 03/2021 (Dr Rush Landmark) ?- No gross lesions in esophagus proximally. LA Grade A esophagitis with no bleeding ?distally (this is improved from prior). ?- Z-line irregular, 34 cm from the incisors. ?- 3 cm hiatal hernia. ?- Four gastric polyps. Resected and retrieved. Clips (MR conditional) were placed. ?- Gastritis. Biopsied. ?- No gross lesions in the duodenal bulb, in the first portion of the duodenum and in the ?second portion of the duodenum. ? ? ?EGD 11/27/2020 ?- LA Grade C reflux esophagitis with no bleeding. Bx- neg ?- 3 cm hiatal hernia. S/P fundoplication. ?- 3 10-12 mm semi-sessile polyps with no bleeding and no stigmata of recent bleeding were ?found in the gastric body (1 at the diaphragmatic hiatus and the other 2 in proximal body of ?the stomach). Bx- Hyperplastic ?- Normal examined duodenum. ? ?Colon 11/27/2020 ?- Seven 6 to 8 mm polyps in the mid descending colon, in the mid transverse colon, in the ?proximal ascending colon and in the mid ascending colon, removed with a cold  snare. ?Resected and retrieved. Bx- TAs ?- Diverticulosis in the sigmoid colon and in the ascending colon. ? ?-EGD 08/2019: Candida esophagitis, no stricture, 7 cm hiatal hernia.  Treated empirically for Candida esophagitis  EGD 12/14/2018: Large HH, hyperplastic gastric polyps, eso stricture s/p dil 50 Fr. EGD 08/17/2015 mod HH, neg SB Bx for celiac. ? ?-UGI series  04/2019: HH, GERD, no strictures.  Ba tab passed without any problems. ? ?-Colonoscopy 12/14/2018-colonic polyps s/p polypectomy, mild pancolonic div. Bx- TA. ? ?-CT AP with contrast 02/09/2018: Neg, Stable left adrenal adenoma, small right inguinal hernia. ? ?-GES 08/2019: delayed gastric emptying (12% emptied at hr 1, 29% at hr 2, 47% at hr 3, 64% an hr 4). ? ?-CT AP 07/2020 ?Impression: 1. Mildly thickened appearance of the hemidiaphragms without a discrete measurable mass, overall similar relative to the prior CT dated 01/08/2020 in this patient with biopsy-proven peritoneal mesothelioma involving the right hemidiaphragm. No definite new peritoneal nodules are identified. ?2. No ascites. ?3. Similar groundglass opacities/nodules throughout the lungs bilaterally, most conspicuous in the right lower lobe. These findings may be infectious, inflammatory, or neoplastic in etiology. ?4. 4 mm solid right lower lobe pulmonary nodule, unchanged. The previously identified solid nodule in the right upper lobe is no longer seen. ?5. Indeterminate bilateral adrenal lesions, measuring up to 1.7 cm on the left. Consider recharacterization with MRI is recommended. ?6. Multiple additional ancillary findings, as above. ? ? ?============================ ? ?PATHOLOGY: ?PATHOLOGY: ?Final Pathologic Diagnosis  ?A. "Frozen peritoneal nodule":  ?      Mesothelioma, epithelioid type, low tumor grade, trabecular pattern. ?      See comment. ?  ?B. "Peritoneal nodule": ?      Mesothelioma, epithelioid type, low tumor grade, trabecular pattern. ?      See comment.  ? ? ? ?LHC 05/08/2019: ?Prox RCA lesion is 50% stenosed. ?Prox LAD lesion is 40% stenosed. ?The left ventricular systolic function is normal. ?LV end diastolic pressure is normal. ?The left ventricular ejection fraction is greater than 65% by visual estimate. ?There is no mitral valve regurgitation. ?Past Medical History:  ?Diagnosis Date  ? Aftercare following surgery 03/26/2020  ?  Allergy   ? Angina pectoris ([place]) 06/17/2020  ? Anxiety   ? Arthritis   ? Asthma   ? Bronchitis   ? CAD (coronary artery disease) 02/13/2019  ? Chest pain 05/08/2019  ? Chest pain of uncertain etiology   ? Chronic bilateral low back pain without sciatica 07/11/2019  ? Formatting of this note might be different from the original. Added automatically from request for surgery [id number]  ? Chronic obstructive pulmonary disease ([place]) 02/09/2017  ? Formatting of this note might be different from the original. Last Assessment & Plan:  I will review her PFTs after I obtain records from her pulmonologist. Based on this we will decide if she needs inhalers, subjectively they do not seem to have helped her at all. She may benefit from a pulmonary rehabilitation program Formatting of this note might be different from the original. Last Assessment   ? COPD (chronic obstructive pulmonary disease) ([place]) 02/09/2017  ? Coronary artery disease involving native coronary artery of native heart without angina pectoris 06/10/2015  ? Diabetes mellitus   ? Diabetes mellitus due to underlying condition with unspecified complications ([place]) 12/45/8099  ? Dyslipidemia 06/10/2015  ? Esophageal dysphagia 09/02/2019  ? Formatting of this note might be different from the original. Added automatically from request for surgery [id number]  ? Essential hypertension 06/10/2015  ?  Gastroparesis 09/04/2019  ? GERD (gastroesophageal reflux disease)   ? H/O hiatal hernia   ? Headache 09/24/2018  ? Heart murmur   ? Hyperlipemia   ? Hypertension   ? Mixed dyslipidemia 06/10/2015  ? Moderate aortic stenosis 05/16/2019  ? Nausea and vomiting 06/16/2020  ? Nonspecific (abnormal) findings on radiological and other examination of gastrointestinal tract 05/19/2011  ? OSA (obstructive sleep apnea) 02/09/2017  ? Pain and swelling of toe of right foot 07/15/2016  ? Palpitations 06/02/2016  ? 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 Formatting of this note might be different from  the original. Added automatically from request for surgery [id number]  ? Peritoneal mesothelioma ([place]) 01/16/2020  ? S/P lumbar fusion 03/26/2020  ? Shortness of breath   ? on  excertion  ? Sleep apnea   ? ? ?Curr

## 2021-09-22 NOTE — Patient Instructions (Addendum)
If you are age 78 or older, your body mass index should be between 23-30. Your Body mass index is 28.37 kg/m?Marland Kitchen If this is out of the aforementioned range listed, please consider follow up with your Primary Care Provider. ? ?If you are age 55 or younger, your body mass index should be between 19-25. Your Body mass index is 28.37 kg/m?Marland Kitchen If this is out of the aformentioned range listed, please consider follow up with your Primary Care Provider.  ? ?________________________________________________________ ? ?The [facility] providers would like to encourage you to use [facility] to communicate with providers for non-urgent requests or questions.  Due to long hold times on the telephone, sending your provider a message by [facility] may be a faster and more efficient way to get a response.  Please allow 48 business hours for a response.  Please remember that this is for non-urgent requests.  ?_______________________________________________________ ? ?We have sent the following medications to your pharmacy for you to pick up at your convenience: ?Protonix ? ?Please mail back hemoccult cards to [facility] ? ?CAPSULE ENDOSCOPY ?PATIENT INSTRUCTION SHEET ? ?Deborah Hutchinson ?1945-10-12 ?[id number] ? ? ?09-29-2021 Seven (7) days prior to capsule endoscopy stop taking iron supplements and carafate. ? ?10-04-2021 Two (2) days prior to capsule endoscopy stop taking aspirin or any arthritis drugs. ? ?10-05-2021 Day before capsule endoscopy purchase a 238 gram bottle of Miralax from the laxative section of your drug store, and a 32 oz. bottle of Gatorade (no red).   ? ?10-05-2021 One (1) day prior to capsule endoscopy: ?Stop smoking. ?Eat a regular diet until 12:00 Noon. ?After 12:00 Noon take only the following: ?Black coffee  Jell-O (no fruit or red Jell-o) ?Water   Bouillon (chicken or beef) ?7-Up   Cranberry Juice ?Tea   Kool-Aid Popsicle (not red) ?Sprite   Coke ?Ginger Ale  Pepsi ?Mountain Dew Gatorade ?At 6:00 pm the evening  before your appointment, drink 7 capfuls (105 grams) of Miralax with 32 oz. Gatorade. Drink 8 oz every 15 minutes until gone. ?Nothing to eat or drink after midnight except medications with a sip of water. ? ?10-06-2021 Day of capsule endoscopy: ? ?No medications for 2 hours prior to your test. ? ?Please arrive at [facility]  3rd floor patient registration area by 830am on: 10-06-2021.  ? ?For any questions: ?Call [employer] at [phone] and ask to speak with one of the capsule endoscopy nurses. ? ?YOU WILL NEED TO RETURN THE EQUIPMENT AT 4 PM ON THE DAY OF THE PROCEDURE.  PLEASE KEEP THIS IN MIND WHEN SCHEDULING.  ? ?The above instructions have been reviewed and explained to me by________________  ? ?Patient signature:_________________________________________     Date:________________ ? ?Small Bowel Capsule Endoscopy ? ?What you should know: ?Small Bowel capsule endoscopy is a procedure that takes pictures of the inside of your small intestine (bowel).  Your small bowel connects to your stomach on one end, and your large bowel (colon) on the other.  A capsule endoscopy is done by swallowing a pill size camera.  The capsule moves through your stomach and into your small bowel, where pictures are taken.  ? ?You may need a small bowel capsule endoscopy if you have symptoms, such as blood in your stool, chronic stomach pain, and diarrhea.  The pictures may show if you have growths, swelling, and bleeding area in you small bowel.  A capsule endoscopy may also show if diseases such as Crohn's or celiac disease are causing your symptoms.  Having a small bowel capsule endoscopy may help you and your caregiver learn the cause of your symptoms.  Learning what is causing your symptoms allows you to receive needed treatment and prevent further problems. ?Risks: ?You may have stomach pain during your procedure.   ?The pictures taken by the capsule may not be clear.   ?The pictures may not show the cause of your  symptoms.   ?You may need another endoscopy procedure.  ? The capsule may get trapped in your esophagus or intestines. You may need surgery or additional procedures to remove the capsule from your body. ? ? ? ?Before your procedure: ?You will be instructed to stop certain prescription medications or over- the -counter medications prior to the procedure.   ?The day before your scheduled appointment you will need to be on a restricted diet and will need to drink a bowel prep that will clean out your bowels.   ?The day of the procedure: ?You may drive yourself to the procedure.   ?You will need to plan on 2 trips to the office on the day of the procedure. ?Morning: ?Plan to be at the office about 45 minutes. ?The morning of the procedure a sensor belt and recorder will be placed on you.  You will wear this for 8 hours.  (The sensor belt transfers pictures of your small bowel to the recorder.)   You will be given a pill-sized capsule endoscope to swallow.  Once you swallow the capsule it will travel through your body the same way food does, constantly taking pictures along the way.  The capsule takes 2-3 pictures a second.   ?Once you have left the office you may go about your normal day with a few exceptions: You may not go near a MRI machine or a radio or television towers; You need to avoid other patients having capsule endoscopy; You will be given a written diet to follow for the day. ? ?Afternoon: ?You will need to be return to the office at your designated time. ?The sensors belt will be removed ?You will need to be at the office about 15 minutes. ? ? ? ?

## 2021-09-27 MED FILL — Ferumoxytol Inj 510 MG/17ML (30 MG/ML) (Elemental Fe): INTRAVENOUS | Qty: 17 | Status: AC

## 2021-09-28 ENCOUNTER — Ambulatory Visit: Payer: Medicare HMO | Admitting: Cardiology

## 2021-09-28 ENCOUNTER — Inpatient Hospital Stay: Payer: Medicare HMO

## 2021-09-28 ENCOUNTER — Other Ambulatory Visit: Payer: Self-pay

## 2021-09-28 VITALS — BP 152/67 | HR 70 | Temp 98.6°F | Resp 20 | Ht 65.5 in | Wt 174.2 lb

## 2021-09-28 DIAGNOSIS — D508 Other iron deficiency anemias: Secondary | ICD-10-CM

## 2021-09-28 DIAGNOSIS — D509 Iron deficiency anemia, unspecified: Secondary | ICD-10-CM | POA: Diagnosis not present

## 2021-09-28 DIAGNOSIS — D631 Anemia in chronic kidney disease: Secondary | ICD-10-CM | POA: Diagnosis not present

## 2021-09-28 DIAGNOSIS — N189 Chronic kidney disease, unspecified: Secondary | ICD-10-CM | POA: Diagnosis not present

## 2021-09-28 MED ORDER — SODIUM CHLORIDE 0.9 % IV SOLN
510.0000 mg | Freq: Once | INTRAVENOUS | Status: AC
  Administered 2021-09-28: 510 mg via INTRAVENOUS
  Filled 2021-09-28: qty 510

## 2021-09-28 MED ORDER — SODIUM CHLORIDE 0.9 % IV SOLN: Freq: Once | INTRAVENOUS | Status: AC

## 2021-09-28 NOTE — Patient Instructions (Signed)

## 2021-09-30 ENCOUNTER — Encounter: Payer: Self-pay | Admitting: Cardiology

## 2021-09-30 ENCOUNTER — Ambulatory Visit (INDEPENDENT_AMBULATORY_CARE_PROVIDER_SITE_OTHER): Payer: Medicare HMO | Admitting: Cardiology

## 2021-09-30 VITALS — BP 138/70 | HR 70 | Ht 65.6 in | Wt 171.6 lb

## 2021-09-30 DIAGNOSIS — I10 Essential (primary) hypertension: Secondary | ICD-10-CM | POA: Diagnosis not present

## 2021-09-30 DIAGNOSIS — E782 Mixed hyperlipidemia: Secondary | ICD-10-CM | POA: Diagnosis not present

## 2021-09-30 DIAGNOSIS — I350 Nonrheumatic aortic (valve) stenosis: Secondary | ICD-10-CM

## 2021-09-30 DIAGNOSIS — I2510 Atherosclerotic heart disease of native coronary artery without angina pectoris: Secondary | ICD-10-CM

## 2021-09-30 NOTE — Patient Instructions (Signed)

## 2021-09-30 NOTE — Progress Notes (Signed)
Cardiology Office Note:    Date:  09/30/2021   ID:  Deborah Hutchinson, DOB [birth year], MRN [id number]  PCP:  [name], MD  Cardiologist:  [name], MD   Referring MD: [name], MD    ASSESSMENT:    1. Coronary artery disease involving native coronary artery of native heart without angina pectoris   2. Essential hypertension   3. Mixed hyperlipidemia   4. Mixed dyslipidemia    PLAN:    In order of problems listed above:  Coronary artery disease: Secondary prevention stressed with patient.  Importance of compliance with diet and medication stressed and she vocalized understanding. Mild to moderate aortic stenosis: Stable and asymptomatic.  Symptoms discussed and educated the patient.  She understands. Renal insufficiency: Stable and followed by primary care.  Possibly this is the reason for her anemia but this is assessed by her gastroenterologist. Essential hypertension: Blood pressure is stable and diet was emphasized. Mixed dyslipidemia: Lipids are fine and I reviewed primary care lab work extensively and discussed with the patient at length. Patient will be seen in follow-up appointment in 9 months or earlier if the patient has any concerns    Medication Adjustments/Labs and Tests Ordered: Current medicines are reviewed at length with the patient today.  Concerns regarding medicines are outlined above.  No orders of the defined types were placed in this encounter.  No orders of the defined types were placed in this encounter.    No chief complaint on file.    History of Present Illness:    Deborah Hutchinson is a 76 y.o. female.  Patient has past medical history of coronary artery disease, mild to moderate aortic stenosis, essential hypertension, dyslipidemia, renal insufficiency and anemia.  She denies any problems at this time and takes care of activities of daily living.  She leads a sedentary lifestyle because of orthopedic issues.  At the time of  my evaluation, the patient is alert awake oriented and in no distress.  Past Medical History:  Diagnosis Date   Aftercare following surgery 03/26/2020   Allergy    Angina pectoris ([place]) 06/17/2020   Anxiety    Arthritis    Asthma    Bronchitis    CAD (coronary artery disease) 02/13/2019   Chest pain 05/08/2019   Chest pain of uncertain etiology    Chronic bilateral low back pain without sciatica 07/11/2019   Formatting of this note might be different from the original. Added automatically from request for surgery [id number]   Chronic obstructive pulmonary disease ([place]) 02/09/2017   Formatting of this note might be different from the original. Last Assessment & Plan:  I will review her PFTs after I obtain records from her pulmonologist. Based on this we will decide if she needs inhalers, subjectively they do not seem to have helped her at all. She may benefit from a pulmonary rehabilitation program Formatting of this note might be different from the original. Last Assessment    COPD (chronic obstructive pulmonary disease) ([place]) 02/09/2017   Coronary artery disease involving native coronary artery of native heart without angina pectoris 06/10/2015   Diabetes mellitus due to underlying condition with unspecified complications ([place]) 91/47/8295   Dyslipidemia 06/10/2015   Esophageal dysphagia 09/02/2019   Formatting of this note might be different from the original. Added automatically from request for surgery [id number]   Essential hypertension 06/10/2015   Gastroparesis 09/04/2019   GERD (gastroesophageal reflux disease)    H/O hiatal hernia    Headache  09/24/2018   Heart murmur    Hyperlipemia    Hypertension    Iron deficiency anemia 09/25/2020   Lumbar radiculopathy 12/18/2020   Mixed dyslipidemia 06/10/2015   Moderate aortic stenosis 05/16/2019   Nausea and vomiting 06/16/2020   Neurogenic claudication 12/18/2020   OSA (obstructive sleep apnea) 02/09/2017   Pain and swelling of toe of  right foot 07/15/2016   Palpitations 06/02/2016   Paraesophageal hernia 10/30/2019   Formatting of this note might be different from the original. Added automatically from request for surgery [id number]   Peritoneal mesothelioma ([place]) 01/16/2020   S/P lumbar fusion 03/26/2020   Shortness of breath    on  excertion   Sleep apnea     Past Surgical History:  Procedure Laterality Date   ABDOMINAL HYSTERECTOMY     ABDOMINAL HYSTERECTOMY     BIOPSY  03/22/2021   Procedure: BIOPSY;  Surgeon: Irving Copas., MD;  Location: [facility];  Service: Gastroenterology;;   BREAST SURGERY     begin mass,left  breast   COLONOSCOPY  08/17/2015   Colonic polyp status post polypectomy. Mild pancolonic diverticulosis. Highly redundant colon.    DILATION AND CURETTAGE OF UTERUS     one   ENDOSCOPIC MUCOSAL RESECTION N/A 03/22/2021   Procedure: ENDOSCOPIC MUCOSAL RESECTION;  Surgeon: Rush Landmark Telford Nab., MD;  Location: [facility];  Service: Gastroenterology;  Laterality: N/A;   ESOPHAGOGASTRODUODENOSCOPY  08/17/2015   Moderate hiatal hernia. Otherwise noraml EGD.    ESOPHAGOGASTRODUODENOSCOPY  09/03/2019   [place]   ESOPHAGOGASTRODUODENOSCOPY (EGD) WITH PROPOFOL N/A 03/22/2021   Procedure: ESOPHAGOGASTRODUODENOSCOPY (EGD) WITH PROPOFOL;  Surgeon: Rush Landmark Telford Nab., MD;  Location: [facility];  Service: Gastroenterology;  Laterality: N/A;   EUS  05/19/2011   Procedure: UPPER ENDOSCOPIC ULTRASOUND (EUS) LINEAR;  Surgeon: Owens Loffler, MD;  Location: [facility];  Service: Endoscopy;  Laterality: N/A;  radial linear    HEMOSTASIS CLIP PLACEMENT  03/22/2021   Procedure: HEMOSTASIS CLIP PLACEMENT;  Surgeon: Irving Copas., MD;  Location: [facility];  Service: Gastroenterology;;   HERNIA REPAIR  05/09/2020   HYSTEROTOMY     KNEE SURGERY     LEFT HEART CATH AND CORONARY ANGIOGRAPHY     LEFT HEART CATH AND CORONARY ANGIOGRAPHY N/A 05/08/2019   Procedure:  LEFT HEART CATH AND CORONARY ANGIOGRAPHY;  Surgeon: Burnell Blanks, MD;  Location: [facility];  Service: Cardiovascular;  Laterality: N/A;   right thumb     tendon repaire   RIGHT/LEFT HEART CATH AND CORONARY ANGIOGRAPHY N/A 06/23/2020   Procedure: RIGHT/LEFT HEART CATH AND CORONARY ANGIOGRAPHY;  Surgeon: Martinique, Peter M, MD;  Location: [facility];  Service: Cardiovascular;  Laterality: N/A;   SHOULDER SURGERY     rotator cuff repair   SUBMUCOSAL LIFTING INJECTION  03/22/2021   Procedure: SUBMUCOSAL LIFTING INJECTION;  Surgeon: Irving Copas., MD;  Location: [facility];  Service: Gastroenterology;;   TRANSFORAMINAL LUMBAR INTERBODY FUSION L4-5   02/27/2020   WRIST SURGERY     [place]    Current Medications: Current Meds  Medication Sig   albuterol (PROVENTIL HFA;VENTOLIN HFA) 108 (90 Base) MCG/ACT inhaler Inhale 2 puffs into the lungs every 6 (six) hours as needed for wheezing or shortness of breath.   azelastine (ASTELIN) 0.1 % nasal spray Use two sprays in each nostril twice daily as directed for nasal drainage.   BREZTRI AEROSPHERE 160-9-4.8 MCG/ACT AERO Inhale 1-2 puffs into the lungs daily.   buPROPion (WELLBUTRIN XL) 300  MG 24 hr tablet Take 300 mg by mouth daily.    canagliflozin (INVOKANA) 300 MG TABS tablet Take 300 mg by mouth daily.   celecoxib (CELEBREX) 100 MG capsule Take 100 mg by mouth 2 (two) times daily.   Cholecalciferol (VITAMIN D3) 1.25 MG (50000 UT) CAPS Take 50,000 Units by mouth every 30 (thirty) days.   clopidogrel (PLAVIX) 75 MG tablet TAKE 1 TABLET EVERY DAY   diclofenac Sodium (VOLTAREN) 1 % GEL Apply 1 g topically as needed (Joint pain).   donepezil (ARICEPT) 10 MG tablet Take 10 mg by mouth daily at 12 noon.   EPINEPHrine 0.3 mg/0.3 mL IJ SOAJ injection Inject 0.3 mg into the muscle as needed for anaphylaxis.   escitalopram (LEXAPRO) 20 MG tablet Take 20 mg by mouth daily.   famotidine (PEPCID) 20 MG tablet Take 20 mg by  mouth in the morning and at bedtime.   Fluticasone-Umeclidin-Vilant (TRELEGY ELLIPTA) 200-62.5-25 MCG/INH AEPB Inhale 1 puff into the lungs daily.   isosorbide mononitrate (IMDUR) 120 MG 24 hr tablet Take 1 tablet by mouth once daily   levocetirizine (XYZAL) 5 MG tablet TAKE 1 TABLET BY MOUTH ONCE DAILY IN THE EVENING   lidocaine (LIDODERM) 5 % Place 1 patch onto the skin daily.   liraglutide (VICTOZA) 18 MG/3ML SOPN Inject 1.8 mg into the skin daily.   losartan-hydrochlorothiazide (HYZAAR) 100-25 MG tablet Take 1 tablet by mouth daily.   metoCLOPramide (REGLAN) 10 MG tablet Take 10 mg by mouth 4 (four) times daily as needed for nausea.   montelukast (SINGULAIR) 10 MG tablet Take 10 mg by mouth at bedtime.    Multiple Vitamin (MULTIVITAMIN) capsule Take 1 capsule by mouth daily.   nitroGLYCERIN (NITROSTAT) 0.4 MG SL tablet Place 0.4 mg under the tongue every 5 (five) minutes as needed for chest pain.   pantoprazole (PROTONIX) 40 MG tablet Take 1 tablet (40 mg total) by mouth 2 (two) times daily before a meal.   Potassium Chloride CR (MICRO-K) 8 MEQ CPCR capsule CR Take 1 capsule (8 mEq total) by mouth daily.   prednisoLONE acetate (PRED FORTE) 1 % ophthalmic suspension Place 1 drop into the left eye 4 (four) times daily.   pregabalin (LYRICA) 50 MG capsule Take 50 mg by mouth daily.   rosuvastatin (CRESTOR) 20 MG tablet Take 1 tablet (20 mg total) by mouth daily.     Allergies:   Penicillins, Ampicillin, and Tramadol   Social History   Socioeconomic History   Marital status: Single    Spouse name: Not on file   Number of children: 3   Years of education: Not on file   Highest education level: Not on file  Occupational History   Occupation: retired  Tobacco Use   Smoking status: Former    Packs/day: 1.00    Types: Cigarettes    Quit date: 05/17/2011    Years since quitting: 10.3   Smokeless tobacco: Never  Vaping Use   Vaping Use: Never used  Substance and Sexual Activity    Alcohol use: No   Drug use: No   Sexual activity: Never  Other Topics Concern   Not on file  Social History Narrative   Right handed   Social Determinants of Health   Financial Resource Strain: Not on file  Food Insecurity: Not on file  Transportation Needs: Not on file  Physical Activity: Not on file  Stress: Not on file  Social Connections: Not on file     Family History: The  patient's family history includes Cancer in her sister; Diabetes in her sister; Hypertension in her daughter. There is no history of Colon cancer, Liver disease, Pancreatic cancer, or Esophageal cancer.  ROS:   Please see the history of present illness.    All other systems reviewed and are negative.  EKGs/Labs/Other Studies Reviewed:    The following studies were reviewed today: I discussed my findings with the patient   Recent Labs: 09/13/2021: ALT 33; BUN 12; Creatinine 1.2; Hemoglobin 8.4; Platelets 392; Potassium 3.8; Sodium 141  Recent Lipid Panel    Component Value Date/Time   CHOL 159 02/03/2020 0937   TRIG 65 02/03/2020 0937   HDL 82 02/03/2020 0937   CHOLHDL 1.9 02/03/2020 0937   LDLCALC 64 02/03/2020 0937    Physical Exam:    VS:  BP 138/70   Pulse 70   Ht 5' 5.6" (1.666 m)   Wt 171 lb 9.6 oz (77.8 kg)   SpO2 98%   BMI 28.04 kg/m     Wt Readings from Last 3 Encounters:  09/30/21 171 lb 9.6 oz (77.8 kg)  09/28/21 174 lb 4 oz (79 kg)  09/22/21 173 lb 2 oz (78.5 kg)     GEN: Patient is in no acute distress HEENT: Normal NECK: No JVD; No carotid bruits LYMPHATICS: No lymphadenopathy CARDIAC: Hear sounds regular, 2/6 systolic murmur at the apex. RESPIRATORY:  Clear to auscultation without rales, wheezing or rhonchi  ABDOMEN: Soft, non-tender, non-distended MUSCULOSKELETAL:  No edema; No deformity  SKIN: Warm and dry NEUROLOGIC:  Alert and oriented x 3 PSYCHIATRIC:  Normal affect   Signed, [name], MD  09/30/2021 10:36 AM    [place]

## 2021-10-06 ENCOUNTER — Ambulatory Visit (INDEPENDENT_AMBULATORY_CARE_PROVIDER_SITE_OTHER): Payer: Medicare HMO | Admitting: Gastroenterology

## 2021-10-06 ENCOUNTER — Encounter: Payer: Self-pay | Admitting: Gastroenterology

## 2021-10-06 DIAGNOSIS — D509 Iron deficiency anemia, unspecified: Secondary | ICD-10-CM

## 2021-10-06 DIAGNOSIS — J301 Allergic rhinitis due to pollen: Secondary | ICD-10-CM | POA: Diagnosis not present

## 2021-10-06 NOTE — Patient Instructions (Signed)
  The capsule endoscopy procedure will last approximately 8 hours. Contact your doctor's office immediately if you suffer from any abdominal pain, nausea or vomiting during the procedure. 1. You may drink colorless liquids starting 2 hours after swallowing the capsule.  2. You may have a light snack  4 hours after ingestion. After the examination is completed you may return to your normal diet.  3. Check the blue flashing DataRecorder light a couple of times through the day. If is stops blinking or changes color, note the time and contact your doctor.  4. Avoid strong electromagnetic fields such as MRI devices or ham radios after swallowing the capsule and until you pass it in a bowel movement.  5. Do not disconnect the equipment or completely remove the DataRecorder at any time during the procedure.  6. Treat the DataRecorder carefully. Avoid sudden movements and banging the DataRecorder.  7. Avoid direct exposure to bright sunlight.  8. Return to the office at 4:30 pm to have the recording equipment removed.  Clear Liquid Diet Examples: Black Coffee (non-dairy creamer ok) Jell-O (NO fruit added & NO red Jell-o) Water Bouillon (Chicken or Beef) 7-up Cranberry Juice Apple Juice Popsicles (NO red) Tea Coke Sprite Pepsi Ginger Ale Gatorade Mt Dew Dr Pepper Light Snack Examples: Soup Cereal 1/2 Sandwich Salad Eggs Potatoes Toast Rice  

## 2021-10-06 NOTE — Progress Notes (Signed)
CAPSULE ID: [id number] Exp: 2023-01-14 LOT: [id number]  Patient arrived for capsule endoscopy. Reported the prep went well. Confirmed patient is fasting. Explained dietary restrictions for the next few hours. Patient verbalized understanding. Opened capsule, ensured capsule was flashing and transmitting to the recorder prior to the patient swallowing the capsule. Patient swallowed capsule without difficulty. Patient told to call the office with any questions. Understands to return to the office today between 4:00 and 4:30 pm. No further questions by the conclusion of the visit.

## 2021-10-11 ENCOUNTER — Other Ambulatory Visit: Payer: Self-pay | Admitting: Allergy

## 2021-10-11 ENCOUNTER — Other Ambulatory Visit (INDEPENDENT_AMBULATORY_CARE_PROVIDER_SITE_OTHER): Payer: Medicare HMO

## 2021-10-11 DIAGNOSIS — J019 Acute sinusitis, unspecified: Secondary | ICD-10-CM | POA: Diagnosis not present

## 2021-10-11 DIAGNOSIS — D509 Iron deficiency anemia, unspecified: Secondary | ICD-10-CM | POA: Diagnosis not present

## 2021-10-11 DIAGNOSIS — K449 Diaphragmatic hernia without obstruction or gangrene: Secondary | ICD-10-CM | POA: Diagnosis not present

## 2021-10-11 DIAGNOSIS — R059 Cough, unspecified: Secondary | ICD-10-CM | POA: Diagnosis not present

## 2021-10-11 DIAGNOSIS — K219 Gastro-esophageal reflux disease without esophagitis: Secondary | ICD-10-CM | POA: Diagnosis not present

## 2021-10-11 DIAGNOSIS — Z6827 Body mass index (BMI) 27.0-27.9, adult: Secondary | ICD-10-CM | POA: Diagnosis not present

## 2021-10-11 DIAGNOSIS — J209 Acute bronchitis, unspecified: Secondary | ICD-10-CM | POA: Diagnosis not present

## 2021-10-11 LAB — HEMOCCULT SLIDES (X 3 CARDS)
Fecal Occult Blood: NEGATIVE
OCCULT 1: NEGATIVE
OCCULT 2: NEGATIVE
OCCULT 3: NEGATIVE
OCCULT 4: NEGATIVE
OCCULT 5: NEGATIVE

## 2021-10-13 ENCOUNTER — Telehealth: Payer: Self-pay

## 2021-10-13 DIAGNOSIS — R5383 Other fatigue: Secondary | ICD-10-CM | POA: Diagnosis not present

## 2021-10-13 DIAGNOSIS — Z8616 Personal history of COVID-19: Secondary | ICD-10-CM | POA: Diagnosis not present

## 2021-10-13 DIAGNOSIS — J4540 Moderate persistent asthma, uncomplicated: Secondary | ICD-10-CM | POA: Diagnosis not present

## 2021-10-13 DIAGNOSIS — G4733 Obstructive sleep apnea (adult) (pediatric): Secondary | ICD-10-CM | POA: Diagnosis not present

## 2021-10-13 DIAGNOSIS — J301 Allergic rhinitis due to pollen: Secondary | ICD-10-CM | POA: Diagnosis not present

## 2021-10-13 NOTE — Telephone Encounter (Signed)
-----   Message from Jackquline Denmark, MD sent at 10/13/2021  2:07 PM EDT ----- Regarding: FW: Capsule Needs x-ray KUB today (to ensure passage of capsule)  RG    ----- Message ----- From: Alfredia Ferguson, PA-C Sent: 10/13/2021  11:27 AM EDT To: Jackquline Denmark, MD Subject: Capsule                                        Capsule ready for review /Sign on this pt  Study was incomplete retained in the stomach for the entire study   Please attach your nurse to this message to call and be sure pt passed the capsule

## 2021-10-13 NOTE — Telephone Encounter (Signed)
Left message for patient to call back  

## 2021-10-14 ENCOUNTER — Other Ambulatory Visit: Payer: Self-pay

## 2021-10-14 DIAGNOSIS — D509 Iron deficiency anemia, unspecified: Secondary | ICD-10-CM

## 2021-10-14 NOTE — Telephone Encounter (Signed)
Spoke with patient regarding capsule. She is unsure if she passed it. She has been advised to come in for KUB today, however she states she is unsure if she can come in today, but would try for tomorrow.

## 2021-10-18 ENCOUNTER — Telehealth: Payer: Self-pay

## 2021-10-18 ENCOUNTER — Ambulatory Visit (INDEPENDENT_AMBULATORY_CARE_PROVIDER_SITE_OTHER)
Admission: RE | Admit: 2021-10-18 | Discharge: 2021-10-18 | Disposition: A | Discharge status: Home / Self Care | Payer: Medicare HMO | Source: Ambulatory Visit | Attending: Gastroenterology | Admitting: Gastroenterology

## 2021-10-18 DIAGNOSIS — D509 Iron deficiency anemia, unspecified: Secondary | ICD-10-CM | POA: Diagnosis not present

## 2021-10-18 DIAGNOSIS — Z981 Arthrodesis status: Secondary | ICD-10-CM | POA: Diagnosis not present

## 2021-10-18 DIAGNOSIS — I878 Other specified disorders of veins: Secondary | ICD-10-CM | POA: Diagnosis not present

## 2021-10-18 DIAGNOSIS — G252 Other specified forms of tremor: Secondary | ICD-10-CM | POA: Diagnosis not present

## 2021-10-18 DIAGNOSIS — M1990 Unspecified osteoarthritis, unspecified site: Secondary | ICD-10-CM | POA: Diagnosis not present

## 2021-10-18 DIAGNOSIS — R935 Abnormal findings on diagnostic imaging of other abdominal regions, including retroperitoneum: Secondary | ICD-10-CM | POA: Diagnosis not present

## 2021-10-18 NOTE — Telephone Encounter (Signed)
Chart reviewed: Pt had xray done today at 10:20 AM:

## 2021-10-18 NOTE — Telephone Encounter (Signed)
-----   Message from Carl Best, RN sent at 10/15/2021  2:03 PM EDT ----- Routing this to Dr. Steve Rattler nurse, Remo Lipps so that he is aware.

## 2021-10-20 DIAGNOSIS — J301 Allergic rhinitis due to pollen: Secondary | ICD-10-CM | POA: Diagnosis not present

## 2021-10-20 DIAGNOSIS — H52209 Unspecified astigmatism, unspecified eye: Secondary | ICD-10-CM | POA: Diagnosis not present

## 2021-10-20 DIAGNOSIS — H524 Presbyopia: Secondary | ICD-10-CM | POA: Diagnosis not present

## 2021-10-20 DIAGNOSIS — H5203 Hypermetropia, bilateral: Secondary | ICD-10-CM | POA: Diagnosis not present

## 2021-10-20 DIAGNOSIS — R0600 Dyspnea, unspecified: Secondary | ICD-10-CM | POA: Diagnosis not present

## 2021-10-25 NOTE — Progress Notes (Unsigned)
NEUROLOGY FOLLOW UP OFFICE NOTE  Deborah Hutchinson [id number]  Assessment/Plan:   Tremor - not observed today.  Reportedly a resting tremor.  However, she does not exhibit any other signs or symptoms of Parkinson's disease on exam.    At this time, we will monitor for any worsening or new symptoms.  The next time she has a tremor, I advised to record it with a phone so that I may review it next time.  Since I am unable to observe it now, I don't know if it should be treated as a Parkinsonian tremor or essential tremor.  However, as symptoms are not frequent and affecting her quality of life, would not start medication at this time anyway.  I will see her back in 6 months for re-evaluation.  Subjective:  Deborah Hutchinson is a 76 year old female with iron-deficiency anemia, DM II, CAD, orthostatic hypotension, and HTN whom I previously saw for headache presents today for resting tremor.  She is accompanied by her daughter who supplements history.  She started noticing tremors in the hands about 4 months ago.  When she is not paying attention, her hands shake, right worse than left.  It doesn't occur with action, only at rest.  No changes in gait, balance, vision, swallowing.  Her daughter thinks it is getting worse. No new medications started just prior to onset of symptoms.  No know family history of tremor.    PAST MEDICAL HISTORY: Past Medical History:  Diagnosis Date   Aftercare following surgery 03/26/2020   Allergy    Angina pectoris ([place]) 06/17/2020   Anxiety    Arthritis    Asthma    Bronchitis    CAD (coronary artery disease) 02/13/2019   Chest pain 05/08/2019   Chest pain of uncertain etiology    Chronic bilateral low back pain without sciatica 07/11/2019   Formatting of this note might be different from the original. Added automatically from request for surgery [id number]   Chronic obstructive pulmonary disease ([place]) 02/09/2017   Formatting of this note might be different  from the original. Last Assessment & Plan:  I will review her PFTs after I obtain records from her pulmonologist. Based on this we will decide if she needs inhalers, subjectively they do not seem to have helped her at all. She may benefit from a pulmonary rehabilitation program Formatting of this note might be different from the original. Last Assessment    COPD (chronic obstructive pulmonary disease) ([place]) 02/09/2017   Coronary artery disease involving native coronary artery of native heart without angina pectoris 06/10/2015   Diabetes mellitus due to underlying condition with unspecified complications ([place]) 34/28/7681   Dyslipidemia 06/10/2015   Esophageal dysphagia 09/02/2019   Formatting of this note might be different from the original. 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 Added automatically from  request for surgery [id number]   Peritoneal mesothelioma ([place]) 01/16/2020   S/P lumbar fusion 03/26/2020   Shortness of breath    on  excertion   Sleep apnea     MEDICATIONS: Current Outpatient Medications on File Prior to Visit  Medication Sig Dispense Refill   albuterol (PROVENTIL HFA;VENTOLIN HFA) 108 (90 Base) MCG/ACT inhaler Inhale 2 puffs into the lungs every 6 (six) hours as needed for wheezing or shortness of breath. 3 Inhaler 3   azelastine (ASTELIN)  0.1 % nasal spray Use two sprays in each nostril twice daily as directed for nasal drainage. 30 mL 5   BREZTRI AEROSPHERE 160-9-4.8 MCG/ACT AERO Inhale 1-2 puffs into the lungs daily.     buPROPion (WELLBUTRIN XL) 300 MG 24 hr tablet Take 300 mg by mouth daily.      canagliflozin (INVOKANA) 300 MG TABS tablet Take 300 mg by mouth daily.     celecoxib (CELEBREX) 100 MG capsule Take 100 mg by mouth 2 (two) times daily.     Cholecalciferol (VITAMIN D3) 1.25 MG (50000 UT) CAPS Take 50,000 Units by mouth every 30 (thirty) days.     clopidogrel (PLAVIX) 75 MG tablet TAKE 1 TABLET EVERY DAY 90 tablet 0   diclofenac Sodium (VOLTAREN) 1 % GEL Apply 1 g topically as needed (Joint pain).     donepezil (ARICEPT) 10 MG tablet Take 10 mg by mouth daily at 12 noon.     EPINEPHrine 0.3 mg/0.3 mL IJ SOAJ injection Inject 0.3 mg into the muscle as needed for anaphylaxis.     escitalopram (LEXAPRO) 20 MG tablet Take 20 mg by mouth daily.     famotidine (PEPCID) 20 MG tablet Take 20 mg by mouth in the morning and at bedtime.     Fluticasone-Umeclidin-Vilant (TRELEGY ELLIPTA) 200-62.5-25 MCG/INH AEPB Inhale 1 puff into the lungs daily.     isosorbide mononitrate (IMDUR) 120 MG 24 hr tablet Take 1 tablet by mouth once daily 60 tablet 6   levocetirizine (XYZAL) 5 MG tablet TAKE 1 TABLET BY MOUTH ONCE DAILY IN THE EVENING 30 tablet 0   lidocaine (LIDODERM) 5 % Place 1 patch onto the skin daily.     LINZESS 72 MCG capsule TAKE 1 CAPSULE BEFORE BREAKFAST (Patient not taking: Reported on 09/30/2021) 90 capsule 2   liraglutide (VICTOZA) 18 MG/3ML SOPN Inject 1.8 mg into the skin daily.     losartan-hydrochlorothiazide (HYZAAR) 100-25 MG tablet Take 1 tablet by mouth daily.     metoCLOPramide (REGLAN) 10 MG tablet Take 10 mg by mouth 4 (four) times daily as needed for nausea.     montelukast (SINGULAIR) 10 MG tablet Take 10 mg by mouth at bedtime.      Multiple Vitamin (MULTIVITAMIN) capsule Take 1 capsule by mouth daily.      nitroGLYCERIN (NITROSTAT) 0.4 MG SL tablet Place 0.4 mg under the tongue every 5 (five) minutes as needed for chest pain.     pantoprazole (PROTONIX) 40 MG tablet Take 1 tablet (40 mg total) by mouth 2 (two) times daily before a meal. 180 tablet 5   Potassium Chloride CR (MICRO-K) 8 MEQ CPCR capsule CR Take 1 capsule (8 mEq total) by mouth daily. 90 capsule 1   prednisoLONE acetate (PRED FORTE) 1 % ophthalmic suspension Place 1 drop into the left eye 4 (four) times daily.     pregabalin (LYRICA) 50 MG capsule Take 50 mg by mouth daily.     rosuvastatin (CRESTOR) 20 MG tablet Take 1  tablet (20 mg total) by mouth daily. 90 tablet 2   No current facility-administered medications on file prior to visit.    ALLERGIES: Allergies  Allergen Reactions   Penicillins Rash    Reaction was 30years ago  Has never taken again Reaction was 30years ago  Has never taken again Has patient had a PCN reaction causing anaphylaxis, immediate rash, facial/tongue/throat swelling, SOB or lightheadedness with hypotension? no Has patient had a PCN reaction causing severe rash involving mucus membranes or skin necrosis?  no Has patient had a PCN reaction that required hospitilization?no  Has patient had a PCN reaction occurring within the last 10 years?  no If all of the above answers are "no" then may proceed with cephalosporin use   Ampicillin Hives   Tramadol Other (See Comments)    Hallunications      FAMILY HISTORY: Family History  Problem Relation Age of Onset   Diabetes Sister    Cancer Sister    Hypertension Daughter    Colon cancer Neg Hx    Liver disease Neg Hx    Pancreatic cancer Neg Hx    Esophageal cancer Neg Hx       Objective:  Blood pressure 126/77, pulse 74, height '5\' 5"'$  (1.651 m), weight 170 lb 9.6 oz (77.4 kg), SpO2 99 %. General: No acute distress.  Patient appears well-groomed.   Head:  Normocephalic/atraumatic Eyes:  Fundi examined but not visualized Neck: supple, no  paraspinal tenderness, full range of motion Heart:  Regular rate and rhythm Neurological Exam: alert and oriented to person, place, and time.  Speech fluent and not dysarthric, language intact.  CN II-XII intact. Bulk and tone normal, no rigidity, no bradykinesia.  muscle strength 5/5 throughout.  No tremor observed.  Sensation to light touch intact.  Deep tendon reflexes absent throughout, toes downgoing.  Finger to nose testing intact. Ambulates with cane but no shuffling and with normal arm swing.  Romberg steady.     Metta Clines, DO  CC: [name], MD

## 2021-10-26 ENCOUNTER — Encounter: Payer: Self-pay | Admitting: Neurology

## 2021-10-26 ENCOUNTER — Ambulatory Visit (INDEPENDENT_AMBULATORY_CARE_PROVIDER_SITE_OTHER): Payer: Medicare HMO | Admitting: Neurology

## 2021-10-26 VITALS — BP 126/77 | HR 74 | Ht 65.0 in | Wt 170.6 lb

## 2021-10-26 DIAGNOSIS — R251 Tremor, unspecified: Secondary | ICD-10-CM

## 2021-10-26 NOTE — Patient Instructions (Signed)
Next time you start to have the tremor, record it with the phone Follow up in 6 months for re-evaluation

## 2021-10-27 DIAGNOSIS — J301 Allergic rhinitis due to pollen: Secondary | ICD-10-CM | POA: Diagnosis not present

## 2021-11-01 DIAGNOSIS — B372 Candidiasis of skin and nail: Secondary | ICD-10-CM | POA: Diagnosis not present

## 2021-11-01 DIAGNOSIS — G252 Other specified forms of tremor: Secondary | ICD-10-CM | POA: Diagnosis not present

## 2021-11-02 DIAGNOSIS — E785 Hyperlipidemia, unspecified: Secondary | ICD-10-CM | POA: Diagnosis not present

## 2021-11-02 DIAGNOSIS — Z139 Encounter for screening, unspecified: Secondary | ICD-10-CM | POA: Diagnosis not present

## 2021-11-02 DIAGNOSIS — Z1331 Encounter for screening for depression: Secondary | ICD-10-CM | POA: Diagnosis not present

## 2021-11-02 DIAGNOSIS — Z0000 Encounter for general adult medical examination without abnormal findings: Secondary | ICD-10-CM | POA: Diagnosis not present

## 2021-11-02 DIAGNOSIS — Z9181 History of falling: Secondary | ICD-10-CM | POA: Diagnosis not present

## 2021-11-03 DIAGNOSIS — J301 Allergic rhinitis due to pollen: Secondary | ICD-10-CM | POA: Diagnosis not present

## 2021-11-15 DIAGNOSIS — M159 Polyosteoarthritis, unspecified: Secondary | ICD-10-CM | POA: Diagnosis not present

## 2021-11-15 DIAGNOSIS — I10 Essential (primary) hypertension: Secondary | ICD-10-CM | POA: Diagnosis not present

## 2021-11-15 DIAGNOSIS — M7989 Other specified soft tissue disorders: Secondary | ICD-10-CM | POA: Diagnosis not present

## 2021-11-15 DIAGNOSIS — L0390 Cellulitis, unspecified: Secondary | ICD-10-CM | POA: Diagnosis not present

## 2021-11-15 DIAGNOSIS — E1140 Type 2 diabetes mellitus with diabetic neuropathy, unspecified: Secondary | ICD-10-CM | POA: Diagnosis not present

## 2021-11-17 DIAGNOSIS — J301 Allergic rhinitis due to pollen: Secondary | ICD-10-CM | POA: Diagnosis not present

## 2021-11-29 ENCOUNTER — Other Ambulatory Visit: Payer: Self-pay | Admitting: Cardiology

## 2021-11-29 ENCOUNTER — Telehealth: Payer: Self-pay | Admitting: Cardiology

## 2021-11-29 DIAGNOSIS — E088 Diabetes mellitus due to underlying condition with unspecified complications: Secondary | ICD-10-CM

## 2021-11-29 DIAGNOSIS — I2510 Atherosclerotic heart disease of native coronary artery without angina pectoris: Secondary | ICD-10-CM

## 2021-11-29 NOTE — Telephone Encounter (Signed)
Pt reported "a little dull chest pain" over her left and right breasts area, comes and goes, does not radiate, no shortness of breath, no swelling, has not had to use Nitroglycerin x 1 week. No blood pressure or HR readings. All medications are the same.

## 2021-11-29 NOTE — Telephone Encounter (Signed)
Sent message to front desk to schedule Nurse visit per Dr. Joya Gaskins recommendations.

## 2021-11-29 NOTE — Telephone Encounter (Signed)
Pt c/o of Chest Pain: STAT if CP now or developed within 24 hours  1. Are you having CP right now?  No  2. Are you experiencing any other symptoms (ex. SOB, nausea, vomiting, sweating)?   No  3. How long have you been experiencing CP?   About a week  4. Is your CP continuous or coming and going?    Coming and going  5. Have you taken Nitroglycerin? No  Patient stated the chest pains are not severe but she can tell that they are there ?

## 2021-11-30 ENCOUNTER — Other Ambulatory Visit: Payer: Self-pay

## 2021-11-30 DIAGNOSIS — R519 Headache, unspecified: Secondary | ICD-10-CM

## 2021-11-30 DIAGNOSIS — H9313 Tinnitus, bilateral: Secondary | ICD-10-CM

## 2021-11-30 MED ORDER — CLOPIDOGREL BISULFATE 75 MG PO TABS: 75.0000 mg | ORAL_TABLET | Freq: Every day | ORAL | 0 refills | Status: DC

## 2021-12-01 ENCOUNTER — Ambulatory Visit (INDEPENDENT_AMBULATORY_CARE_PROVIDER_SITE_OTHER): Payer: Medicare HMO

## 2021-12-01 VITALS — BP 110/78 | HR 66 | Ht 65.0 in | Wt 172.0 lb

## 2021-12-01 DIAGNOSIS — I209 Angina pectoris, unspecified: Secondary | ICD-10-CM

## 2021-12-01 DIAGNOSIS — J301 Allergic rhinitis due to pollen: Secondary | ICD-10-CM | POA: Diagnosis not present

## 2021-12-01 NOTE — Progress Notes (Signed)
   Nurse Visit   Date of Encounter: 12/01/2021 ID: Deborah Hutchinson, DOB [birth year], MRN [id number]  PCP:  [name], MD   [facility] Providers Cardiologist:  Dr. Barbaraann Rondo to update primary MD,subspecialty MD or APP then REFRESH:1}     Visit Details   VS:  BP 110/78 (BP Location: Left Arm, Patient Position: Sitting, Cuff Size: Normal)   Pulse 66   Ht '5\' 5"'$  (1.651 m)   Wt 172 lb (78 kg)   SpO2 96%   BMI 28.62 kg/m  , BMI Body mass index is 28.62 kg/m.  Wt Readings from Last 3 Encounters:  12/01/21 172 lb (78 kg)  10/26/21 170 lb 9.6 oz (77.4 kg)  09/30/21 171 lb 9.6 oz (77.8 kg)     Reason for visit: Perform an EKG and Vitals Performed today: Vitals, EKG, Education and Provider consulted Changes (medications, testing, etc.) : Set up appointment with  Dr. Geraldo Pitter the first day back from vacation Length of Visit: 20 minutes    Medications Adjustments/Labs and Tests Ordered: No orders of the defined types were placed in this encounter.  No orders of the defined types were placed in this encounter.    Signed, Louie Casa, RN  12/01/2021 10:00 AM

## 2021-12-03 ENCOUNTER — Other Ambulatory Visit: Payer: Self-pay

## 2021-12-06 ENCOUNTER — Encounter: Payer: Self-pay | Admitting: Cardiology

## 2021-12-06 ENCOUNTER — Ambulatory Visit (INDEPENDENT_AMBULATORY_CARE_PROVIDER_SITE_OTHER): Payer: Medicare HMO | Admitting: Cardiology

## 2021-12-06 VITALS — BP 130/86 | HR 76 | Ht 65.6 in | Wt 171.8 lb

## 2021-12-06 DIAGNOSIS — I25118 Atherosclerotic heart disease of native coronary artery with other forms of angina pectoris: Secondary | ICD-10-CM | POA: Diagnosis not present

## 2021-12-06 DIAGNOSIS — I350 Nonrheumatic aortic (valve) stenosis: Secondary | ICD-10-CM | POA: Diagnosis not present

## 2021-12-06 DIAGNOSIS — E118 Type 2 diabetes mellitus with unspecified complications: Secondary | ICD-10-CM | POA: Diagnosis not present

## 2021-12-06 DIAGNOSIS — I2510 Atherosclerotic heart disease of native coronary artery without angina pectoris: Secondary | ICD-10-CM

## 2021-12-06 DIAGNOSIS — E088 Diabetes mellitus due to underlying condition with unspecified complications: Secondary | ICD-10-CM

## 2021-12-06 DIAGNOSIS — I10 Essential (primary) hypertension: Secondary | ICD-10-CM

## 2021-12-06 DIAGNOSIS — R7989 Other specified abnormal findings of blood chemistry: Secondary | ICD-10-CM

## 2021-12-06 DIAGNOSIS — R7401 Elevation of levels of liver transaminase levels: Secondary | ICD-10-CM | POA: Diagnosis not present

## 2021-12-06 DIAGNOSIS — E782 Mixed hyperlipidemia: Secondary | ICD-10-CM | POA: Diagnosis not present

## 2021-12-06 DIAGNOSIS — E559 Vitamin D deficiency, unspecified: Secondary | ICD-10-CM | POA: Diagnosis not present

## 2021-12-06 DIAGNOSIS — I209 Angina pectoris, unspecified: Secondary | ICD-10-CM

## 2021-12-06 NOTE — Patient Instructions (Signed)
Medication Instructions:  Your physician recommends that you continue on your current medications as directed. Please refer to the Current Medication list given to you today.  *If you need a refill on your cardiac medications before your next appointment, please call your pharmacy*   Lab Work: Your physician recommends that you return for lab work in: the morning you have your stress test. You need to have labs done when you are fasting.  You can come Monday through Friday 8:30 am to 12:00 pm and 1:15 to 4:30. You do not need to make an appointment as the order has already been placed. The labs you are going to have done are BMET, CBC, TSH, LFT and Lipids.  If you have labs (blood work) drawn today and your tests are completely normal, you will receive your results only by: [place] (if you have MyChart) OR A paper copy in the mail If you have any lab test that is abnormal or we need to change your treatment, we will call you to review the results.   Testing/Procedures: Your physician has requested that you have a lexiscan myoview. For further information please visit [email or web address]. Please follow instruction sheet, as given.  The test will take approximately 3 to 4 hours to complete; you may bring reading material.  If someone comes with you to your appointment, they will need to remain in the main lobby due to limited space in the testing area.   How to prepare for your Myocardial Perfusion Test: Do not eat or drink 3 hours prior to your test, except you may have water. Do not consume products containing caffeine (regular or decaffeinated) 12 hours prior to your test. (ex: coffee, chocolate, sodas, tea). Do bring a list of your current medications with you.  If not listed below, you may take your medications as normal. Do wear comfortable clothes (no dresses or overalls) and walking shoes, tennis shoes preferred (No heels or open toe shoes are allowed). Do NOT wear cologne,  perfume, aftershave, or lotions (deodorant is allowed). If these instructions are not followed, your test will have to be rescheduled.    Follow-Up: At [facility], you and your health needs are our priority.  As part of our continuing mission to provide you with exceptional heart care, we have created designated Provider Care Teams.  These Care Teams include your primary Cardiologist (physician) and Advanced Practice Providers (APPs -  Physician Assistants and Nurse Practitioners) who all work together to provide you with the care you need, when you need it.  We recommend signing up for the patient portal called "MyChart".  Sign up information is provided on this After Visit Summary.  MyChart is used to connect with patients for Virtual Visits (Telemedicine).  Patients are able to view lab/test results, encounter notes, upcoming appointments, etc.  Non-urgent messages can be sent to your provider as well.   To learn more about what you can do with MyChart, go to [email or web address].    Your next appointment:   9 month(s)  The format for your next appointment:   In Person  Provider:   Jyl Heinz, MD   Other Instructions Cardiac Nuclear Scan A cardiac nuclear scan is a test that is done to check the flow of blood to your heart. It is done when you are resting and when you are exercising. The test looks for problems such as: Not enough blood reaching a portion of the heart. The heart muscle not working as it should. You  may need this test if: You have heart disease. You have had lab results that are not normal. You have had heart surgery or a balloon procedure to open up blocked arteries (angioplasty). You have chest pain. You have shortness of breath. In this test, a special dye (tracer) is put into your bloodstream. The tracer will travel to your heart. A camera will then take pictures of your heart to see how the tracer moves through your heart. This test is usually done  at a hospital and takes 2-4 hours. Tell a doctor about: Any allergies you have. All medicines you are taking, including vitamins, herbs, eye drops, creams, and over-the-counter medicines. Any problems you or family members have had with anesthetic medicines. Any blood disorders you have. Any surgeries you have had. Any medical conditions you have. Whether you are pregnant or may be pregnant. What are the risks? Generally, this is a safe test. However, problems may occur, such as: Serious chest pain and heart attack. This is only a risk if the stress portion of the test is done. Rapid heartbeat. A feeling of warmth in your chest. This feeling usually does not last long. Allergic reaction to the tracer. What happens before the test? Ask your doctor about changing or stopping your normal medicines. This is important. Follow instructions from your doctor about what you cannot eat or drink. Remove your jewelry on the day of the test. What happens during the test? An IV tube will be inserted into one of your veins. Your doctor will give you a small amount of tracer through the IV tube. You will wait for 20-40 minutes while the tracer moves through your bloodstream. Your heart will be monitored with an electrocardiogram (ECG). You will lie down on an exam table. Pictures of your heart will be taken for about 15-20 minutes. You may also have a stress test. For this test, one of these things may be done: You will be asked to exercise on a treadmill or a stationary bike. You will be given medicines that will make your heart work harder. This is done if you are unable to exercise. When blood flow to your heart has peaked, a tracer will again be given through the IV tube. After 20-40 minutes, you will get back on the exam table. More pictures will be taken of your heart. Depending on the tracer that is used, more pictures may need to be taken 3-4 hours later. Your IV tube will be removed when the  test is over. The test may vary among doctors and hospitals. What happens after the test? Ask your doctor: Whether you can return to your normal schedule, including diet, activities, and medicines. Whether you should drink more fluids. This will help to remove the tracer from your body. Drink enough fluid to keep your pee (urine) pale yellow. Ask your doctor, or the department that is doing the test: When will my results be ready? How will I get my results? Summary A cardiac nuclear scan is a test that is done to check the flow of blood to your heart. Tell your doctor whether you are pregnant or may be pregnant. Before the test, ask your doctor about changing or stopping your normal medicines. This is important. Ask your doctor whether you can return to your normal activities. You may be asked to drink more fluids. This information is not intended to replace advice given to you by your health care provider. Make sure you discuss any questions you have  with your health care provider. Document Revised: 08/15/2018 Document Reviewed: 10/09/2017 Elsevier Patient Education  [place].

## 2021-12-06 NOTE — Progress Notes (Signed)
Cardiology Office Note:    Date:  12/06/2021   ID:  Deborah Hutchinson, DOB 1945-10-25, MRN [id number]  PCP:  [name], MD  Cardiologist:  [name], MD   Referring MD: [name], MD    ASSESSMENT:    1. Angina pectoris ([place])   2. Coronary artery disease involving native coronary artery of native heart without angina pectoris   3. Essential hypertension   4. Mixed hyperlipidemia   5. Mixed dyslipidemia   6. Moderate aortic stenosis   7. Diabetes mellitus due to underlying condition with unspecified complications ([place])   8. Elevated LFTs    PLAN:    In order of problems listed above:  Coronary artery disease: Chest pain: Secondary prevention stressed with patient.  Importance of compliance with diet and medication stressed and she vocalized understanding.  In view of risk factors she will undergo Lexiscan sestamibi to assess this.  She is agreeable. Essential hypertension: Blood pressure stable and diet was emphasized.  Lifestyle modification urged. Mixed dyslipidemia and diabetes mellitus: On guideline directed medical therapy.  She will come fasting for the stress test at which time she will have BLOOD work.  She is agreeable. Mild to moderate aortic stenosis: Stable at this time.  No syncope or dyspnea on exertion.  Educated her about the symptoms. Patient will be seen in follow-up appointment in 6 months or earlier if the patient has any concerns    Medication Adjustments/Labs and Tests Ordered: Current medicines are reviewed at length with the patient today.  Concerns regarding medicines are outlined above.  No orders of the defined types were placed in this encounter.  No orders of the defined types were placed in this encounter.    No chief complaint on file.    History of Present Illness:    Deborah Hutchinson is a 76 y.o. female.  Patient has past medical history of mild to moderate aortic stenosis, essential hypertension, dyslipidemia and  diabetes mellitus.  She leads a sedentary lifestyle because of orthopedic issues.  She denies any orthopnea or PND.  At the time of my evaluation, the patient is alert awake oriented and in no distress.  She mentions to me that she occasionally will have chest discomfort this is not related to exertion.  No radiation to the neck or to the arms.  Past Medical History:  Diagnosis Date   Aftercare following surgery 03/26/2020   Allergy    Angina pectoris ([place]) 06/17/2020   Anxiety    Arthritis    Asthma    Bronchitis    CAD (coronary artery disease) 02/13/2019   Chest pain 05/08/2019   Chest pain of uncertain etiology    Chronic bilateral low back pain without sciatica 07/11/2019   Formatting of this note might be different from the original. Added automatically from request for surgery [id number]   Chronic obstructive pulmonary disease ([place]) 02/09/2017   Formatting of this note might be different from the original. Last Assessment & Plan:  I will review her PFTs after I obtain records from her pulmonologist. Based on this we will decide if she needs inhalers, subjectively they do not seem to have helped her at all. She may benefit from a pulmonary rehabilitation program Formatting of this note might be different from the original. Last Assessment    COPD (chronic obstructive pulmonary disease) ([place]) 02/09/2017   Coronary artery disease involving native coronary artery of native heart without angina pectoris 06/10/2015   Diabetes mellitus due to underlying  condition with unspecified complications ([place]) 45/07/8880   Dyslipidemia 06/10/2015   Esophageal dysphagia 09/02/2019   Formatting of this note might be different from the original. Added automatically from request for surgery [id number]   Essential hypertension 06/10/2015   Gastroparesis 09/04/2019   GERD (gastroesophageal reflux disease)    H/O hiatal hernia    Headache 09/24/2018   Heart murmur    Hyperlipemia    Hypertension    Iron  deficiency anemia 09/25/2020   Lumbar radiculopathy 12/18/2020   Mixed dyslipidemia 06/10/2015   Moderate aortic stenosis 05/16/2019   Nausea and vomiting 06/16/2020   Neurogenic claudication 12/18/2020   OSA (obstructive sleep apnea) 02/09/2017   Pain and swelling of toe of right foot 07/15/2016   Palpitations 06/02/2016   Paraesophageal hernia 10/30/2019   Formatting of this note might be different from the original. Added automatically from request for surgery [id number]   Peritoneal mesothelioma ([place]) 01/16/2020   S/P lumbar fusion 03/26/2020   Shortness of breath    on  excertion   Sleep apnea     Past Surgical History:  Procedure Laterality Date   ABDOMINAL HYSTERECTOMY     ABDOMINAL HYSTERECTOMY     BIOPSY  03/22/2021   Procedure: BIOPSY;  Surgeon: Irving Copas., MD;  Location: [facility];  Service: Gastroenterology;;   BREAST SURGERY     begin mass,left  breast   COLONOSCOPY  08/17/2015   Colonic polyp status post polypectomy. Mild pancolonic diverticulosis. Highly redundant colon.    DILATION AND CURETTAGE OF UTERUS     one   ENDOSCOPIC MUCOSAL RESECTION N/A 03/22/2021   Procedure: ENDOSCOPIC MUCOSAL RESECTION;  Surgeon: Rush Landmark Telford Nab., MD;  Location: [facility];  Service: Gastroenterology;  Laterality: N/A;   ESOPHAGOGASTRODUODENOSCOPY  08/17/2015   Moderate hiatal hernia. Otherwise [facility] EGD.    ESOPHAGOGASTRODUODENOSCOPY  09/03/2019   [place]   ESOPHAGOGASTRODUODENOSCOPY (EGD) WITH PROPOFOL N/A 03/22/2021   Procedure: ESOPHAGOGASTRODUODENOSCOPY (EGD) WITH PROPOFOL;  Surgeon: Rush Landmark Telford Nab., MD;  Location: [facility];  Service: Gastroenterology;  Laterality: N/A;   EUS  05/19/2011   Procedure: UPPER ENDOSCOPIC ULTRASOUND (EUS) LINEAR;  Surgeon: Owens Loffler, MD;  Location: [facility];  Service: Endoscopy;  Laterality: N/A;  radial linear    HEMOSTASIS CLIP PLACEMENT  03/22/2021   Procedure: HEMOSTASIS CLIP  PLACEMENT;  Surgeon: Irving Copas., MD;  Location: [facility];  Service: Gastroenterology;;   HERNIA REPAIR  05/09/2020   HYSTEROTOMY     KNEE SURGERY     LEFT HEART CATH AND CORONARY ANGIOGRAPHY     LEFT HEART CATH AND CORONARY ANGIOGRAPHY N/A 05/08/2019   Procedure: LEFT HEART CATH AND CORONARY ANGIOGRAPHY;  Surgeon: Burnell Blanks, MD;  Location: [facility];  Service: Cardiovascular;  Laterality: N/A;   right thumb     tendon repaire   RIGHT/LEFT HEART CATH AND CORONARY ANGIOGRAPHY N/A 06/23/2020   Procedure: RIGHT/LEFT HEART CATH AND CORONARY ANGIOGRAPHY;  Surgeon: Martinique, Peter M, MD;  Location: [facility];  Service: Cardiovascular;  Laterality: N/A;   SHOULDER SURGERY     rotator cuff repair   SUBMUCOSAL LIFTING INJECTION  03/22/2021   Procedure: SUBMUCOSAL LIFTING INJECTION;  Surgeon: Irving Copas., MD;  Location: [facility];  Service: Gastroenterology;;   TRANSFORAMINAL LUMBAR INTERBODY FUSION L4-5   02/27/2020   WRIST SURGERY     [place]    Current Medications: Current Meds  Medication Sig   albuterol (PROVENTIL HFA;VENTOLIN HFA) 108 (90 Base) MCG/ACT inhaler  Inhale 2 puffs into the lungs every 6 (six) hours as needed for wheezing or shortness of breath.   azelastine (ASTELIN) 0.1 % nasal spray Use two sprays in each nostril twice daily as directed for nasal drainage.   BREZTRI AEROSPHERE 160-9-4.8 MCG/ACT AERO Inhale 1-2 puffs into the lungs daily.   buPROPion (WELLBUTRIN XL) 300 MG 24 hr tablet Take 300 mg by mouth daily.    canagliflozin (INVOKANA) 300 MG TABS tablet Take 300 mg by mouth daily.   celecoxib (CELEBREX) 100 MG capsule Take 100 mg by mouth 2 (two) times daily.   Cholecalciferol (VITAMIN D3) 1.25 MG (50000 UT) CAPS Take 50,000 Units by mouth every 30 (thirty) days.   clopidogrel (PLAVIX) 75 MG tablet Take 1 tablet (75 mg total) by mouth daily.   diclofenac Sodium (VOLTAREN) 1 % GEL Apply 1 g topically as needed  (Joint pain).   donepezil (ARICEPT) 10 MG tablet Take 10 mg by mouth daily at 12 noon.   EPINEPHrine 0.3 mg/0.3 mL IJ SOAJ injection Inject 0.3 mg into the muscle as needed for anaphylaxis.   escitalopram (LEXAPRO) 20 MG tablet Take 20 mg by mouth daily.   famotidine (PEPCID) 20 MG tablet Take 20 mg by mouth in the morning and at bedtime.   Fluticasone-Umeclidin-Vilant (TRELEGY ELLIPTA) 200-62.5-25 MCG/INH AEPB Inhale 1 puff into the lungs daily.   furosemide (LASIX) 20 MG tablet Take 20 mg by mouth daily.   isosorbide mononitrate (IMDUR) 120 MG 24 hr tablet Take 1 tablet by mouth once daily   levocetirizine (XYZAL) 5 MG tablet TAKE 1 TABLET BY MOUTH ONCE DAILY IN THE EVENING   lidocaine (LIDODERM) 5 % Place 1 patch onto the skin daily.   LINZESS 72 MCG capsule TAKE 1 CAPSULE BEFORE BREAKFAST   liraglutide (VICTOZA) 18 MG/3ML SOPN Inject 1.8 mg into the skin daily.   losartan-hydrochlorothiazide (HYZAAR) 100-25 MG tablet Take 1 tablet by mouth daily.   metoCLOPramide (REGLAN) 10 MG tablet Take 10 mg by mouth 4 (four) times daily as needed for nausea.   montelukast (SINGULAIR) 10 MG tablet Take 10 mg by mouth at bedtime.    Multiple Vitamin (MULTIVITAMIN) capsule Take 1 capsule by mouth daily.   nitroGLYCERIN (NITROSTAT) 0.4 MG SL tablet Place 0.4 mg under the tongue every 5 (five) minutes as needed for chest pain.   pantoprazole (PROTONIX) 40 MG tablet Take 1 tablet (40 mg total) by mouth 2 (two) times daily before a meal.   Potassium Chloride CR (MICRO-K) 8 MEQ CPCR capsule CR Take 1 capsule (8 mEq total) by mouth daily.   pregabalin (LYRICA) 50 MG capsule Take 50 mg by mouth daily.   rosuvastatin (CRESTOR) 20 MG tablet Take 20 mg by mouth daily.   traZODone (DESYREL) 50 MG tablet Take 50 mg by mouth at bedtime.     Allergies:   Penicillins, Ampicillin, and Tramadol   Social History   Socioeconomic History   Marital status: Single    Spouse name: Not on file   Number of children: 3    Years of education: Not on file   Highest education level: Not on file  Occupational History   Occupation: retired  Tobacco Use   Smoking status: Former    Packs/day: 1.00    Types: Cigarettes    Quit date: 05/17/2011    Years since quitting: 10.5   Smokeless tobacco: Never  Vaping Use   Vaping Use: Never used  Substance and Sexual Activity   Alcohol use: No  Drug use: No   Sexual activity: Never  Other Topics Concern   Not on file  Social History Narrative   Right handed   Social Determinants of Health   Financial Resource Strain: Not on file  Food Insecurity: Not on file  Transportation Needs: Not on file  Physical Activity: Not on file  Stress: Not on file  Social Connections: Not on file     Family History: The patient's family history includes Cancer in her sister; Diabetes in her sister; Hypertension in her daughter. There is no history of Colon cancer, Liver disease, Pancreatic cancer, or Esophageal cancer.  ROS:   Please see the history of present illness.    All other systems reviewed and are negative.  EKGs/Labs/Other Studies Reviewed:    The following studies were reviewed today: I discussed my findings with the patient at extensive length.   Recent Labs: 09/13/2021: ALT 33; BUN 12; Creatinine 1.2; Hemoglobin 8.4; Platelets 392; Potassium 3.8; Sodium 141  Recent Lipid Panel    Component Value Date/Time   CHOL 159 02/03/2020 0937   TRIG 65 02/03/2020 0937   HDL 82 02/03/2020 0937   CHOLHDL 1.9 02/03/2020 0937   LDLCALC 64 02/03/2020 0937    Physical Exam:    VS:  BP 130/86   Pulse 76   Ht 5' 5.6" (1.666 m)   Wt 171 lb 12.8 oz (77.9 kg)   SpO2 97%   BMI 28.07 kg/m     Wt Readings from Last 3 Encounters:  12/06/21 171 lb 12.8 oz (77.9 kg)  12/01/21 172 lb (78 kg)  10/26/21 170 lb 9.6 oz (77.4 kg)     GEN: Patient is in no acute distress HEENT: Normal NECK: No JVD; No carotid bruits LYMPHATICS: No lymphadenopathy CARDIAC: Hear sounds  regular, 2/6 systolic murmur at the apex. RESPIRATORY:  Clear to auscultation without rales, wheezing or rhonchi  ABDOMEN: Soft, non-tender, non-distended MUSCULOSKELETAL:  No edema; No deformity  SKIN: Warm and dry NEUROLOGIC:  Alert and oriented x 3 PSYCHIATRIC:  Normal affect   Signed, [name], MD  12/06/2021 4:47 PM    [facility]

## 2021-12-07 ENCOUNTER — Telehealth: Payer: Self-pay | Admitting: *Deleted

## 2021-12-07 NOTE — Telephone Encounter (Signed)
Patient given detailed instructions per Myocardial Perfusion Study Information Sheet for the test on 12/08/21 at 0815. Patient notified to arrive 15 minutes early and that it is imperative to arrive on time for appointment to keep from having the test rescheduled.  If you need to cancel or reschedule your appointment, please call the office within 24 hours of your appointment. . Patient verbalized understanding.Deborah Hutchinson, Ranae [place]

## 2021-12-08 ENCOUNTER — Ambulatory Visit (INDEPENDENT_AMBULATORY_CARE_PROVIDER_SITE_OTHER): Payer: Medicare HMO

## 2021-12-08 DIAGNOSIS — I2510 Atherosclerotic heart disease of native coronary artery without angina pectoris: Secondary | ICD-10-CM

## 2021-12-08 DIAGNOSIS — I25119 Atherosclerotic heart disease of native coronary artery with unspecified angina pectoris: Secondary | ICD-10-CM | POA: Diagnosis not present

## 2021-12-08 DIAGNOSIS — I209 Angina pectoris, unspecified: Secondary | ICD-10-CM

## 2021-12-08 MED ORDER — REGADENOSON 0.4 MG/5ML IV SOLN
0.4000 mg | Freq: Once | INTRAVENOUS | Status: AC
  Administered 2021-12-08: 0.4 mg via INTRAVENOUS

## 2021-12-08 MED ORDER — TECHNETIUM TC 99M TETROFOSMIN IV KIT
10.8000 | PACK | Freq: Once | INTRAVENOUS | Status: AC | PRN
  Administered 2021-12-08: 10.8 via INTRAVENOUS

## 2021-12-08 MED ORDER — TECHNETIUM TC 99M TETROFOSMIN IV KIT
29.8000 | PACK | Freq: Once | INTRAVENOUS | Status: AC | PRN
  Administered 2021-12-08: 29.8 via INTRAVENOUS

## 2021-12-09 LAB — MYOCARDIAL PERFUSION IMAGING
LV dias vol: 46 mL (ref 46–106)
LV sys vol: 10 mL
Nuc Stress EF: 78 %
Peak HR: 76 {beats}/min
Rest HR: 63 {beats}/min
Rest Nuclear Isotope Dose: 10.8 mCi
SDS: 1
SRS: 0
SSS: 1
ST Depression (mm): 0 mm
Stress Nuclear Isotope Dose: 29.8 mCi
TID: 0.91

## 2021-12-14 ENCOUNTER — Ambulatory Visit: Payer: Medicare HMO | Admitting: Oncology

## 2021-12-14 ENCOUNTER — Other Ambulatory Visit: Payer: Medicare HMO

## 2021-12-14 DIAGNOSIS — J301 Allergic rhinitis due to pollen: Secondary | ICD-10-CM | POA: Diagnosis not present

## 2021-12-15 DIAGNOSIS — J301 Allergic rhinitis due to pollen: Secondary | ICD-10-CM | POA: Diagnosis not present

## 2021-12-20 NOTE — Progress Notes (Incomplete)
[facility]  [address] [place],  [place]  [address] [phone]  Clinic Day:  12/21/2021  Referring physician: [name], MD   HISTORY OF PRESENT ILLNESS:  The patient is a 76 y.o. female with recurrent iron deficiency anemia.  Furthermore, a component of her anemia is related to chronic renal insufficiency.  As recent labs showed recurrent iron deficiency anemia, she just received another course of IV iron.  She comes in today to reassess her hemoglobin and iron levels to see how well she responded to this intervention.  Overall, the patient claims to be feeling much better.  She continues to deny having any overt forms of blood loss.  Of note, she brings think to my attention that she saw her GI physician position earlier today, but nothing was discussed about having her lower GI tract reevaluated for her recurrent iron deficiency anemia.  VITALS:  Blood pressure (!) 166/91, pulse 71, temperature 97.6 F (36.4 C), resp. rate 16, height 5' 5.6" (1.666 m), weight 173 lb 12.8 oz (78.8 kg), SpO2 96 %.  Wt Readings from Last 3 Encounters:  12/21/21 173 lb 12.8 oz (78.8 kg)  12/08/21 171 lb (77.6 kg)  12/06/21 171 lb 12.8 oz (77.9 kg)    Body mass index is 28.4 kg/m.  Performance status (ECOG): 1  PHYSICAL EXAM:  Physical Exam Constitutional:      General: She is not in acute distress.    Appearance: Normal appearance. She is normal weight.  HENT:     Head: Normocephalic and atraumatic.  Eyes:     General: No scleral icterus.    Extraocular Movements: Extraocular movements intact.     Conjunctiva/sclera: Conjunctivae normal.     Pupils: Pupils are equal, round, and reactive to light.  Cardiovascular:     Rate and Rhythm: Normal rate and regular rhythm.     Pulses: Normal pulses.     Heart sounds: Normal heart sounds. No murmur heard.    No friction rub. No gallop.  Pulmonary:     Effort: Pulmonary effort is normal. No respiratory  distress.     Breath sounds: Normal breath sounds.  Abdominal:     General: Bowel sounds are normal. There is no distension.     Palpations: Abdomen is soft. There is no hepatomegaly, splenomegaly or mass.     Tenderness: There is no abdominal tenderness.  Musculoskeletal:        General: Normal range of motion.     Cervical back: Normal range of motion and neck supple.     Right lower leg: No edema.     Left lower leg: No edema.  Lymphadenopathy:     Cervical: No cervical adenopathy.  Skin:    General: Skin is warm and dry.  Neurological:     General: No focal deficit present.     Mental Status: She is alert and oriented to person, place, and time. Mental status is at baseline.  Psychiatric:        Mood and Affect: Mood normal.        Behavior: Behavior normal.        Thought Content: Thought content normal.        Judgment: Judgment normal.     LABS:    Latest Reference Range & Units 12/21/21 00:00  WBC  4.7 (E)  RBC 3.87 - 5.11  4.02 (E)  Hemoglobin 12.0 - 16.0  11.5 ! (E)  HCT 36 - 46  36 (  E)  Platelets 150 - 400 K/uL 269 (E)  !: Data is abnormal (E): External lab result   Latest Reference Range & Units 12/21/21 00:00  Sodium 137 - 147  139 (E)  Potassium 3.5 - 5.1 mEq/L 3.1 ! (E)  Chloride 99 - 108  103 (E)  CO2 13 - 22  28 ! (E)  Glucose  138 (E)  BUN 4 - 21  12 (E)  Creatinine 0.5 - 1.1  1.2 ! (E)  Calcium 8.7 - 10.7  9.5 (E)  Alkaline Phosphatase 25 - 125  93 (E)  Albumin 3.5 - 5.0  4.1 (E)  AST 13 - 35  35 (E)  ALT 7 - 35 U/L 37 ! (E)  Bilirubin, Total  0.4 (E)  !: Data is abnormal (E): External lab result   Latest Reference Range & Units Most Recent 09/13/21 09:09  Iron 28 - 170 ug/dL 54 12/21/21 14:05 26 (L)  UIBC ug/dL 342 12/21/21 14:05 395  TIBC 250 - 450 ug/dL 396 12/21/21 14:05 421  Saturation Ratios 10.4 - 31.8 % 14 12/21/21 14:05 6 (L)  Ferritin 11 - 307 ng/mL 13 12/21/21 14:05 5 (L)  (L): Data is abnormally low  ASSESSMENT & PLAN:   Assessment/Plan:  A 76 year old woman with recurrent iron deficiency anemia.  I am very pleased that her hemoglobin has risen over 3 g since she recently received another course of IV iron.  Clinically, the patient is doing much better.  I have no problem with her continuing to take oral iron on a daily basis for the continued fortification of her iron stores.  However, I do want her to see her GI physician again to undergo a colonoscopy to ensure her recurrent iron deficiency anemia is not due to any lower GI tract pathology.  If one has been done within the past few years, then I will leave it up to her GI physician as to whether anything else needs to be considered from a surveillance perspective.  As she is now stable from a hematologic standpoint, I will see her back in 6 months for repeat clinical assessment. The patient understands all the plans discussed today and is in agreement with them.  Anthonette Lesage Macarthur Critchley, MD

## 2021-12-21 ENCOUNTER — Other Ambulatory Visit: Payer: Self-pay | Admitting: Oncology

## 2021-12-21 ENCOUNTER — Telehealth: Payer: Self-pay | Admitting: Oncology

## 2021-12-21 ENCOUNTER — Inpatient Hospital Stay: Payer: Medicare HMO

## 2021-12-21 ENCOUNTER — Inpatient Hospital Stay: Payer: Medicare HMO | Attending: Oncology | Admitting: Oncology

## 2021-12-21 VITALS — BP 166/91 | HR 71 | Temp 97.6°F | Resp 16 | Ht 65.6 in | Wt 173.8 lb

## 2021-12-21 DIAGNOSIS — D508 Other iron deficiency anemias: Secondary | ICD-10-CM | POA: Diagnosis not present

## 2021-12-21 DIAGNOSIS — D631 Anemia in chronic kidney disease: Secondary | ICD-10-CM | POA: Diagnosis not present

## 2021-12-21 DIAGNOSIS — D509 Iron deficiency anemia, unspecified: Secondary | ICD-10-CM | POA: Insufficient documentation

## 2021-12-21 DIAGNOSIS — N189 Chronic kidney disease, unspecified: Secondary | ICD-10-CM | POA: Insufficient documentation

## 2021-12-21 DIAGNOSIS — J4540 Moderate persistent asthma, uncomplicated: Secondary | ICD-10-CM | POA: Diagnosis not present

## 2021-12-21 DIAGNOSIS — D649 Anemia, unspecified: Secondary | ICD-10-CM | POA: Diagnosis not present

## 2021-12-21 DIAGNOSIS — J301 Allergic rhinitis due to pollen: Secondary | ICD-10-CM | POA: Diagnosis not present

## 2021-12-21 DIAGNOSIS — R5383 Other fatigue: Secondary | ICD-10-CM | POA: Diagnosis not present

## 2021-12-21 DIAGNOSIS — Z8616 Personal history of COVID-19: Secondary | ICD-10-CM | POA: Diagnosis not present

## 2021-12-21 DIAGNOSIS — G4733 Obstructive sleep apnea (adult) (pediatric): Secondary | ICD-10-CM | POA: Diagnosis not present

## 2021-12-21 LAB — CBC AND DIFFERENTIAL
HCT: 36 (ref 36–46)
Hemoglobin: 11.5 — AB (ref 12.0–16.0)
Neutrophils Absolute: 2.4
Platelets: 269 10*3/uL (ref 150–400)
WBC: 4.7

## 2021-12-21 LAB — BASIC METABOLIC PANEL
BUN: 12 (ref 4–21)
CO2: 28 — AB (ref 13–22)
Chloride: 103 (ref 99–108)
Creatinine: 1.2 — AB (ref 0.5–1.1)
Glucose: 138
Potassium: 3.1 mEq/L — AB (ref 3.5–5.1)
Sodium: 139 (ref 137–147)

## 2021-12-21 LAB — HEPATIC FUNCTION PANEL
ALT: 37 U/L — AB (ref 7–35)
AST: 35 (ref 13–35)
Alkaline Phosphatase: 93 (ref 25–125)
Bilirubin, Total: 0.4

## 2021-12-21 LAB — COMPREHENSIVE METABOLIC PANEL
Albumin: 4.1 (ref 3.5–5.0)
Calcium: 9.5 (ref 8.7–10.7)

## 2021-12-21 LAB — IRON AND TIBC
Iron: 54 ug/dL (ref 28–170)
Saturation Ratios: 14 % (ref 10.4–31.8)
TIBC: 396 ug/dL (ref 250–450)
UIBC: 342 ug/dL

## 2021-12-21 LAB — CBC: RBC: 4.02 (ref 3.87–5.11)

## 2021-12-21 LAB — FERRITIN: Ferritin: 13 ng/mL (ref 11–307)

## 2021-12-21 NOTE — Telephone Encounter (Signed)
Per 12/21/21 los next appt scheduled and confirmed with patient

## 2021-12-21 NOTE — Addendum Note (Signed)
Addended by: Lavera Guise A on: 12/21/2021 06:46 PM   Modules accepted: Level of Service

## 2022-01-05 DIAGNOSIS — J301 Allergic rhinitis due to pollen: Secondary | ICD-10-CM | POA: Diagnosis not present

## 2022-01-06 DIAGNOSIS — J449 Chronic obstructive pulmonary disease, unspecified: Secondary | ICD-10-CM | POA: Diagnosis not present

## 2022-01-06 DIAGNOSIS — I2510 Atherosclerotic heart disease of native coronary artery without angina pectoris: Secondary | ICD-10-CM | POA: Diagnosis not present

## 2022-01-06 DIAGNOSIS — I10 Essential (primary) hypertension: Secondary | ICD-10-CM | POA: Diagnosis not present

## 2022-01-11 DIAGNOSIS — J301 Allergic rhinitis due to pollen: Secondary | ICD-10-CM | POA: Diagnosis not present

## 2022-01-18 DIAGNOSIS — J449 Chronic obstructive pulmonary disease, unspecified: Secondary | ICD-10-CM | POA: Diagnosis not present

## 2022-01-18 DIAGNOSIS — J4540 Moderate persistent asthma, uncomplicated: Secondary | ICD-10-CM | POA: Diagnosis not present

## 2022-01-19 DIAGNOSIS — J449 Chronic obstructive pulmonary disease, unspecified: Secondary | ICD-10-CM | POA: Diagnosis not present

## 2022-01-19 DIAGNOSIS — J301 Allergic rhinitis due to pollen: Secondary | ICD-10-CM | POA: Diagnosis not present

## 2022-01-19 DIAGNOSIS — J4540 Moderate persistent asthma, uncomplicated: Secondary | ICD-10-CM | POA: Diagnosis not present

## 2022-01-21 DIAGNOSIS — J4540 Moderate persistent asthma, uncomplicated: Secondary | ICD-10-CM | POA: Diagnosis not present

## 2022-01-21 DIAGNOSIS — J449 Chronic obstructive pulmonary disease, unspecified: Secondary | ICD-10-CM | POA: Diagnosis not present

## 2022-01-24 DIAGNOSIS — J449 Chronic obstructive pulmonary disease, unspecified: Secondary | ICD-10-CM | POA: Diagnosis not present

## 2022-01-24 DIAGNOSIS — J4540 Moderate persistent asthma, uncomplicated: Secondary | ICD-10-CM | POA: Diagnosis not present

## 2022-01-26 DIAGNOSIS — J449 Chronic obstructive pulmonary disease, unspecified: Secondary | ICD-10-CM | POA: Diagnosis not present

## 2022-01-26 DIAGNOSIS — J4540 Moderate persistent asthma, uncomplicated: Secondary | ICD-10-CM | POA: Diagnosis not present

## 2022-01-31 DIAGNOSIS — J4540 Moderate persistent asthma, uncomplicated: Secondary | ICD-10-CM | POA: Diagnosis not present

## 2022-01-31 DIAGNOSIS — J449 Chronic obstructive pulmonary disease, unspecified: Secondary | ICD-10-CM | POA: Diagnosis not present

## 2022-02-02 DIAGNOSIS — J4540 Moderate persistent asthma, uncomplicated: Secondary | ICD-10-CM | POA: Diagnosis not present

## 2022-02-02 DIAGNOSIS — J449 Chronic obstructive pulmonary disease, unspecified: Secondary | ICD-10-CM | POA: Diagnosis not present

## 2022-02-02 DIAGNOSIS — J301 Allergic rhinitis due to pollen: Secondary | ICD-10-CM | POA: Diagnosis not present

## 2022-02-04 DIAGNOSIS — J4540 Moderate persistent asthma, uncomplicated: Secondary | ICD-10-CM | POA: Diagnosis not present

## 2022-02-04 DIAGNOSIS — J449 Chronic obstructive pulmonary disease, unspecified: Secondary | ICD-10-CM | POA: Diagnosis not present

## 2022-02-07 DIAGNOSIS — J449 Chronic obstructive pulmonary disease, unspecified: Secondary | ICD-10-CM | POA: Diagnosis not present

## 2022-02-07 DIAGNOSIS — J4540 Moderate persistent asthma, uncomplicated: Secondary | ICD-10-CM | POA: Diagnosis not present

## 2022-02-08 ENCOUNTER — Other Ambulatory Visit: Payer: Self-pay | Admitting: Allergy

## 2022-02-08 NOTE — Progress Notes (Deleted)
NEUROLOGY FOLLOW UP OFFICE NOTE  Deborah Hutchinson [id number]  Assessment/Plan:   ***  Subjective:  Deborah Hutchinson is a 76 year old female with iron-deficiency anemia, DM II, CAD, orthostatic hypotension, and HTN who follows up for tremors.  She is accompanied by her daughter who supplements history.  UPDATE: Last seen in June.  At that time, tremors were infrequent and she didn't exhibit it on exam.  ***   HISTORY: She started noticing tremors in the hands in early 2023.  When she is not paying attention, her hands shake, right worse than left.  It doesn't occur with action, only at rest.  No changes in gait, balance, vision, swallowing.  Her daughter thinks it is getting worse. No new medications started just prior to onset of symptoms.  No know family history of tremor.    PAST MEDICAL HISTORY: Past Medical History:  Diagnosis Date   Aftercare following surgery 03/26/2020   Allergy    Angina pectoris ([place]) 06/17/2020   Anxiety    Arthritis    Asthma    Bronchitis    CAD (coronary artery disease) 02/13/2019   Chest pain 05/08/2019   Chest pain of uncertain etiology    Chronic bilateral low back pain without sciatica 07/11/2019   Formatting of this note might be different from the original. Added automatically from request for surgery [id number]   Chronic obstructive pulmonary disease ([place]) 02/09/2017   Formatting of this note might be different from the original. Last Assessment & Plan:  I will review her PFTs after I obtain records from her pulmonologist. Based on this we will decide if she needs inhalers, subjectively they do not seem to have helped her at all. She may benefit from a pulmonary rehabilitation program Formatting of this note might be different from the original. Last Assessment    COPD (chronic obstructive pulmonary disease) ([place]) 02/09/2017   Coronary artery disease involving native coronary artery of native heart without angina pectoris 06/10/2015    Diabetes mellitus due to underlying condition with unspecified complications ([place]) 27/10/2374   Dyslipidemia 06/10/2015   Esophageal dysphagia 09/02/2019   Formatting of this note might be different from the original. 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 Added automatically from request for surgery [id number]   Peritoneal mesothelioma ([place]) 01/16/2020   S/P lumbar fusion 03/26/2020   Shortness of breath    on  excertion   Sleep apnea     MEDICATIONS: Current Outpatient Medications on File Prior to Visit  Medication Sig Dispense Refill   albuterol (PROVENTIL HFA;VENTOLIN HFA) 108 (90 Base) MCG/ACT inhaler Inhale 2 puffs into the lungs every 6 (six) hours as needed for wheezing or shortness of breath. 3 Inhaler 3   azelastine (ASTELIN) 0.1 % nasal spray Use two sprays in each nostril twice daily as directed for nasal drainage. [place]  mL 5   BREZTRI AEROSPHERE 160-9-4.8 MCG/ACT AERO Inhale 1-2 puffs into the lungs daily.     buPROPion (WELLBUTRIN XL) 300 MG 24 hr tablet Take 300 mg by mouth daily.      canagliflozin (INVOKANA) 300 MG TABS tablet Take 300 mg by mouth daily.     celecoxib (CELEBREX) 100 MG capsule Take 100 mg by mouth 2 (two) times daily.     Cholecalciferol (VITAMIN D3) 1.25 MG (50000 UT) CAPS Take 50,000 Units by mouth every 30 (thirty) days.      clopidogrel (PLAVIX) 75 MG tablet Take 1 tablet (75 mg total) by mouth daily. 90 tablet 0   diclofenac Sodium (VOLTAREN) 1 % GEL Apply 1 g topically as needed (Joint pain).     donepezil (ARICEPT) 10 MG tablet Take 10 mg by mouth daily at 12 noon.     EPINEPHrine 0.3 mg/0.3 mL IJ SOAJ injection Inject 0.3 mg into the muscle as needed for anaphylaxis.     escitalopram (LEXAPRO) 20 MG tablet Take 20 mg by mouth daily.     famotidine (PEPCID) 20 MG tablet Take 20 mg by mouth in the morning and at bedtime.     Fluticasone-Umeclidin-Vilant (TRELEGY ELLIPTA) 200-62.5-25 MCG/INH AEPB Inhale 1 puff into the lungs daily.     furosemide (LASIX) 20 MG tablet Take 20 mg by mouth daily.     isosorbide mononitrate (IMDUR) 120 MG 24 hr tablet Take 1 tablet by mouth once daily 60 tablet 6   levocetirizine (XYZAL) 5 MG tablet TAKE 1 TABLET BY MOUTH ONCE DAILY IN THE EVENING 30 tablet 0   lidocaine (LIDODERM) 5 % Place 1 patch onto the skin daily.     LINZESS 72 MCG capsule TAKE 1 CAPSULE BEFORE BREAKFAST 90 capsule 2   liraglutide (VICTOZA) 18 MG/3ML SOPN Inject 1.8 mg into the skin daily.     losartan-hydrochlorothiazide (HYZAAR) 100-25 MG tablet Take 1 tablet by mouth daily.     metoCLOPramide (REGLAN) 10 MG tablet Take 10 mg by mouth 4 (four) times daily as needed for nausea.     montelukast (SINGULAIR) 10 MG tablet Take 10 mg by mouth at bedtime.      Multiple Vitamin (MULTIVITAMIN) capsule Take 1 capsule by mouth daily.     nitroGLYCERIN (NITROSTAT) 0.4 MG SL tablet Place 0.4 mg under the tongue every 5 (five) minutes as needed for chest pain.     pantoprazole (PROTONIX) 40 MG tablet Take 1 tablet (40 mg total) by mouth 2 (two) times daily before a meal. 180 tablet 5   Potassium Chloride CR (MICRO-K) 8 MEQ CPCR capsule CR Take 1 capsule (8 mEq total) by mouth daily. 90 capsule 1   pregabalin (LYRICA) 50 MG capsule Take 50 mg by mouth daily.     rosuvastatin (CRESTOR) 20 MG tablet Take 20 mg by mouth  daily.     traZODone (DESYREL) 50 MG tablet Take 50 mg by mouth at bedtime.     No current facility-administered medications on file prior to visit.    ALLERGIES: Allergies  Allergen Reactions   Penicillins Rash    Reaction was 30years ago  Has never taken again Reaction was 30years ago  Has never taken again Has patient had a PCN reaction causing anaphylaxis, immediate rash, facial/tongue/throat swelling, SOB or lightheadedness with hypotension? no Has patient had a PCN reaction causing severe rash involving mucus membranes or skin necrosis?  no Has patient had a PCN reaction that required hospitilization?no  Has patient  had a PCN reaction occurring within the last 10 years?  no If all of the above answers are "no" then may proceed with cephalosporin use   Ampicillin Hives   Tramadol Other (See Comments)    Hallunications      FAMILY HISTORY: Family History  Problem Relation Age of Onset   Diabetes Sister    Cancer Sister    Hypertension Daughter    Colon cancer Neg Hx    Liver disease Neg Hx    Pancreatic cancer Neg Hx    Esophageal cancer Neg Hx       Objective:  *** General: No acute distress.  Patient appears qwkk-groomed.   Head:  Normocephalic/atraumatic Eyes:  Fundi examined but not visualized Neck: supple, no paraspinal tenderness, full range of motion Heart:  Regular rate and rhythm Lungs:  Clear to auscultation bilaterally Back: No paraspinal tenderness Neurological Exam: alert and oriented to person, place, and time.  Speech fluent and not dysarthric, language intact.  CN II-XII intact. Bulk and tone normal, no rigidity, muscle strength 5/5 throughout.  No bradykinesia.  Sensation to light touch intact.  Deep tendon reflexes absent throughout.  Finger to nose testing intact.  Ambulates with cane but no shuffling and with normal arm swing.  Romberg steady.   Metta Clines, DO  CC: [name], MD

## 2022-02-09 DIAGNOSIS — M159 Polyosteoarthritis, unspecified: Secondary | ICD-10-CM | POA: Diagnosis not present

## 2022-02-09 DIAGNOSIS — M25473 Effusion, unspecified ankle: Secondary | ICD-10-CM | POA: Diagnosis not present

## 2022-02-09 DIAGNOSIS — I10 Essential (primary) hypertension: Secondary | ICD-10-CM | POA: Diagnosis not present

## 2022-02-09 DIAGNOSIS — C451 Mesothelioma of peritoneum: Secondary | ICD-10-CM | POA: Diagnosis not present

## 2022-02-09 DIAGNOSIS — K219 Gastro-esophageal reflux disease without esophagitis: Secondary | ICD-10-CM | POA: Diagnosis not present

## 2022-02-09 DIAGNOSIS — R413 Other amnesia: Secondary | ICD-10-CM | POA: Diagnosis not present

## 2022-02-09 DIAGNOSIS — E785 Hyperlipidemia, unspecified: Secondary | ICD-10-CM | POA: Diagnosis not present

## 2022-02-09 DIAGNOSIS — E1140 Type 2 diabetes mellitus with diabetic neuropathy, unspecified: Secondary | ICD-10-CM | POA: Diagnosis not present

## 2022-02-09 DIAGNOSIS — I2510 Atherosclerotic heart disease of native coronary artery without angina pectoris: Secondary | ICD-10-CM | POA: Diagnosis not present

## 2022-02-10 ENCOUNTER — Other Ambulatory Visit: Payer: Self-pay | Admitting: Neurology

## 2022-02-10 ENCOUNTER — Ambulatory Visit: Payer: Medicare HMO | Admitting: Neurology

## 2022-02-10 DIAGNOSIS — R519 Headache, unspecified: Secondary | ICD-10-CM

## 2022-02-10 DIAGNOSIS — H9313 Tinnitus, bilateral: Secondary | ICD-10-CM

## 2022-02-11 DIAGNOSIS — J4540 Moderate persistent asthma, uncomplicated: Secondary | ICD-10-CM | POA: Diagnosis not present

## 2022-02-11 DIAGNOSIS — E785 Hyperlipidemia, unspecified: Secondary | ICD-10-CM | POA: Diagnosis not present

## 2022-02-11 DIAGNOSIS — J449 Chronic obstructive pulmonary disease, unspecified: Secondary | ICD-10-CM | POA: Diagnosis not present

## 2022-02-11 DIAGNOSIS — E1140 Type 2 diabetes mellitus with diabetic neuropathy, unspecified: Secondary | ICD-10-CM | POA: Diagnosis not present

## 2022-02-11 DIAGNOSIS — E559 Vitamin D deficiency, unspecified: Secondary | ICD-10-CM | POA: Diagnosis not present

## 2022-02-11 DIAGNOSIS — Z79899 Other long term (current) drug therapy: Secondary | ICD-10-CM | POA: Diagnosis not present

## 2022-02-14 DIAGNOSIS — J449 Chronic obstructive pulmonary disease, unspecified: Secondary | ICD-10-CM | POA: Diagnosis not present

## 2022-02-14 DIAGNOSIS — J4540 Moderate persistent asthma, uncomplicated: Secondary | ICD-10-CM | POA: Diagnosis not present

## 2022-02-16 DIAGNOSIS — J449 Chronic obstructive pulmonary disease, unspecified: Secondary | ICD-10-CM | POA: Diagnosis not present

## 2022-02-16 DIAGNOSIS — J301 Allergic rhinitis due to pollen: Secondary | ICD-10-CM | POA: Diagnosis not present

## 2022-02-16 DIAGNOSIS — J4540 Moderate persistent asthma, uncomplicated: Secondary | ICD-10-CM | POA: Diagnosis not present

## 2022-02-18 DIAGNOSIS — J449 Chronic obstructive pulmonary disease, unspecified: Secondary | ICD-10-CM | POA: Diagnosis not present

## 2022-02-18 DIAGNOSIS — J4540 Moderate persistent asthma, uncomplicated: Secondary | ICD-10-CM | POA: Diagnosis not present

## 2022-02-21 ENCOUNTER — Other Ambulatory Visit: Payer: Self-pay | Admitting: Gastroenterology

## 2022-02-23 DIAGNOSIS — J4540 Moderate persistent asthma, uncomplicated: Secondary | ICD-10-CM | POA: Diagnosis not present

## 2022-02-23 DIAGNOSIS — J449 Chronic obstructive pulmonary disease, unspecified: Secondary | ICD-10-CM | POA: Diagnosis not present

## 2022-02-25 DIAGNOSIS — J4540 Moderate persistent asthma, uncomplicated: Secondary | ICD-10-CM | POA: Diagnosis not present

## 2022-02-25 DIAGNOSIS — J449 Chronic obstructive pulmonary disease, unspecified: Secondary | ICD-10-CM | POA: Diagnosis not present

## 2022-02-28 DIAGNOSIS — J449 Chronic obstructive pulmonary disease, unspecified: Secondary | ICD-10-CM | POA: Diagnosis not present

## 2022-02-28 DIAGNOSIS — J4540 Moderate persistent asthma, uncomplicated: Secondary | ICD-10-CM | POA: Diagnosis not present

## 2022-03-02 DIAGNOSIS — J4540 Moderate persistent asthma, uncomplicated: Secondary | ICD-10-CM | POA: Diagnosis not present

## 2022-03-02 DIAGNOSIS — J449 Chronic obstructive pulmonary disease, unspecified: Secondary | ICD-10-CM | POA: Diagnosis not present

## 2022-03-02 DIAGNOSIS — J301 Allergic rhinitis due to pollen: Secondary | ICD-10-CM | POA: Diagnosis not present

## 2022-03-04 DIAGNOSIS — J449 Chronic obstructive pulmonary disease, unspecified: Secondary | ICD-10-CM | POA: Diagnosis not present

## 2022-03-04 DIAGNOSIS — J4540 Moderate persistent asthma, uncomplicated: Secondary | ICD-10-CM | POA: Diagnosis not present

## 2022-03-14 DIAGNOSIS — J4540 Moderate persistent asthma, uncomplicated: Secondary | ICD-10-CM | POA: Diagnosis not present

## 2022-03-15 DIAGNOSIS — Z8616 Personal history of COVID-19: Secondary | ICD-10-CM | POA: Diagnosis not present

## 2022-03-15 DIAGNOSIS — J301 Allergic rhinitis due to pollen: Secondary | ICD-10-CM | POA: Diagnosis not present

## 2022-03-15 DIAGNOSIS — G4733 Obstructive sleep apnea (adult) (pediatric): Secondary | ICD-10-CM | POA: Diagnosis not present

## 2022-03-15 DIAGNOSIS — J4540 Moderate persistent asthma, uncomplicated: Secondary | ICD-10-CM | POA: Diagnosis not present

## 2022-03-15 DIAGNOSIS — R5383 Other fatigue: Secondary | ICD-10-CM | POA: Diagnosis not present

## 2022-03-16 DIAGNOSIS — J4540 Moderate persistent asthma, uncomplicated: Secondary | ICD-10-CM | POA: Diagnosis not present

## 2022-03-21 ENCOUNTER — Telehealth: Payer: Self-pay | Admitting: Neurology

## 2022-03-21 DIAGNOSIS — R519 Headache, unspecified: Secondary | ICD-10-CM

## 2022-03-21 DIAGNOSIS — H9313 Tinnitus, bilateral: Secondary | ICD-10-CM

## 2022-03-21 DIAGNOSIS — J4540 Moderate persistent asthma, uncomplicated: Secondary | ICD-10-CM | POA: Diagnosis not present

## 2022-03-21 MED ORDER — CLOPIDOGREL BISULFATE 75 MG PO TABS: 75.0000 mg | ORAL_TABLET | Freq: Every day | ORAL | 0 refills | Status: DC

## 2022-03-21 NOTE — Telephone Encounter (Signed)
Refill of plavix sent in for pt

## 2022-03-21 NOTE — Telephone Encounter (Signed)
1. Which medications need refilled? (List name and dosage, if known) plavix  2. Which pharmacy/location is medication to be sent to? (include street and city if Management consultant) [facility] in [place]

## 2022-03-25 DIAGNOSIS — J301 Allergic rhinitis due to pollen: Secondary | ICD-10-CM | POA: Diagnosis not present

## 2022-04-13 DIAGNOSIS — J301 Allergic rhinitis due to pollen: Secondary | ICD-10-CM | POA: Diagnosis not present

## 2022-04-21 ENCOUNTER — Ambulatory Visit (INDEPENDENT_AMBULATORY_CARE_PROVIDER_SITE_OTHER): Payer: Medicare HMO | Admitting: Physician Assistant

## 2022-04-21 ENCOUNTER — Encounter: Payer: Self-pay | Admitting: Physician Assistant

## 2022-04-21 ENCOUNTER — Ambulatory Visit: Payer: Medicare HMO | Admitting: Nurse Practitioner

## 2022-04-21 VITALS — BP 100/60 | HR 72 | Ht 65.5 in | Wt 170.4 lb

## 2022-04-21 DIAGNOSIS — R141 Gas pain: Secondary | ICD-10-CM

## 2022-04-21 DIAGNOSIS — R140 Abdominal distension (gaseous): Secondary | ICD-10-CM

## 2022-04-21 DIAGNOSIS — R1013 Epigastric pain: Secondary | ICD-10-CM | POA: Diagnosis not present

## 2022-04-21 DIAGNOSIS — R143 Flatulence: Secondary | ICD-10-CM

## 2022-04-21 DIAGNOSIS — K3184 Gastroparesis: Secondary | ICD-10-CM | POA: Diagnosis not present

## 2022-04-21 DIAGNOSIS — R142 Eructation: Secondary | ICD-10-CM

## 2022-04-21 MED ORDER — FAMOTIDINE 40 MG PO TABS
40.0000 mg | ORAL_TABLET | Freq: Two times a day (BID) | ORAL | 11 refills | Status: DC
Start: 2022-04-21 — End: 2023-07-12

## 2022-04-21 NOTE — Patient Instructions (Addendum)
Increase your Famotidine to 40 mg -Twice daily.   Continue Pantoprazole.   Follow up on 05/26/22  at 11:00 am with Ellouise Newer PA-C   Please see Gastroparesis diet.   You have been given a testing kit to check for small intestine bacterial overgrowth (SIBO) which is completed by a company named [facility]. Make sure to return your test in the mail using the return mailing label given to you along with the kit. Your demographic and insurance information have already been sent to the company and they should be in contact with you over the next 1-2 weeks regarding this test. [facility] will collect an upfront charge of $99.74 for commercial insurance plans and $209.74 is you are paying cash. Make sure to discuss with [facility] PRIOR to having the test to see if they have gotten information from your insurance company as to how much your testing will cost out of pocket, if any. Please keep in mind that you will be getting a call from phone number [phone] or a similar number. If you do not hear from them within this time frame, please call our office at [phone] or call [facility] directly at [phone].   Due to recent changes in healthcare laws, you may see the results of your imaging and laboratory studies on MyChart before your provider has had a chance to review them.  We understand that in some cases there may be results that are confusing or concerning to you. Not all laboratory results come back in the same time frame and the provider may be waiting for multiple results in order to interpret others.  Please give [place] 48 hours in order for your provider to thoroughly review all the results before contacting the office for clarification of your results.   Thank you for choosing me and [facility].  Ellouise Newer PA-C

## 2022-04-21 NOTE — Progress Notes (Signed)
Chief Complaint: Abdominal pain, gas and belching  HPI:    Deborah Hutchinson is a 76 year old African-American female, known to Dr. Lyndel Safe, with a past medical history as listed below including CAD, COPD, diabetes, gastroparesis, esophageal dysphagia and multiple others, who was referred to me by [name], MD for a complaint of abdominal pain, gas and belching.      09/22/2021 patient seen in clinic by Dr. Lyndel Safe for recurrent symptomatic IDA with heme positive stools.  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 Rush Landmark and negative colonoscopy 12/2018.  History of GERD with large hiatal hernia status postrepair November 2021 with small residual/recurrent hernia on EGD with reflux esophagitis.  At that time continued on Pantoprazole 40 mg p.o. twice daily and Pepcid 20 mg nightly, recommended to VCE and discussed possible repeat EGD at [facility] long after Plavix washout.    10/06/21 capsule endoscopy was incomplete with gastric retention.  Discussed possible EGD/enteroscopy if needed and if unrevealing with endoscopic placement of capsule.    10/18/2021 abdominal x-ray with no endoscopic capsule identified, nonobstructive bowel gas pattern.    12/21/2021 CBC with a hemoglobin of 11.5 and normal iron panel and ferritin.    Today, the patient tells me that she stopped her Reglan back in July/August because it was causing her to shake and really since then or about a month later she started with a lot of belching and bloating and some epigastric pain as well as flatulence.  Tells me that she feels sharp pains in her epigastrium sometimes radiate underneath her breasts and seems to come and go unrelated to eating.  Continues Famotidine 20 mg twice daily and Pantoprazole 40 mg twice daily.    Denies fever, chills, weight loss, change in bowel habits, blood in her stool, nausea,  vomiting or symptoms that awaken her from sleep.  Past Medical History:  Diagnosis Date   Aftercare following surgery 03/26/2020   Allergy    Angina pectoris ([place]) 06/17/2020   Anxiety    Arthritis    Asthma    Bronchitis    CAD (coronary artery disease) 02/13/2019   Chest pain 05/08/2019   Chest pain of uncertain etiology    Chronic bilateral low back pain without sciatica 07/11/2019   Formatting of this note might be different from the original. Added automatically from request for surgery [id number]   Chronic obstructive pulmonary disease ([place]) 02/09/2017   Formatting of this note might be different from the original. Last Assessment & Plan:  I will review her PFTs after I obtain records from her pulmonologist. Based on this we will decide if she needs inhalers, subjectively they do not seem to have helped her at all. She may benefit from a pulmonary rehabilitation program Formatting of this note might be different from the original. Last Assessment    COPD (chronic obstructive pulmonary disease) ([place]) 02/09/2017   Coronary artery disease involving native coronary artery of native heart without angina pectoris 06/10/2015   Diabetes mellitus due to underlying condition with unspecified complications ([place]) 41/32/4401   Dyslipidemia 06/10/2015   Esophageal dysphagia 09/02/2019   Formatting of this note might be different from the original. Added automatically from request for surgery [id number]   Essential hypertension 06/10/2015   Gastroparesis 09/04/2019   GERD (gastroesophageal reflux disease)    H/O hiatal hernia    Headache 09/24/2018   Heart murmur  Hyperlipemia    Hypertension    Iron deficiency anemia 09/25/2020   Lumbar radiculopathy 12/18/2020   Mixed dyslipidemia 06/10/2015   Moderate aortic stenosis 05/16/2019   Nausea and vomiting 06/16/2020   Neurogenic claudication 12/18/2020   OSA (obstructive sleep apnea) 02/09/2017   Pain and swelling of toe of right foot 07/15/2016    Palpitations 06/02/2016   Paraesophageal hernia 10/30/2019   Formatting of this note might be different from the original. Added automatically from request for surgery [id number]   Peritoneal mesothelioma ([place]) 01/16/2020   S/P lumbar fusion 03/26/2020   Shortness of breath    on  excertion   Sleep apnea     Past Surgical History:  Procedure Laterality Date   ABDOMINAL HYSTERECTOMY     ABDOMINAL HYSTERECTOMY     BIOPSY  03/22/2021   Procedure: BIOPSY;  Surgeon: Irving Copas., MD;  Location: [facility];  Service: Gastroenterology;;   BREAST SURGERY     begin mass,left  breast   COLONOSCOPY  08/17/2015   Colonic polyp status post polypectomy. Mild pancolonic diverticulosis. Highly redundant colon.    DILATION AND CURETTAGE OF UTERUS     one   ENDOSCOPIC MUCOSAL RESECTION N/A 03/22/2021   Procedure: ENDOSCOPIC MUCOSAL RESECTION;  Surgeon: Rush Landmark Telford Nab., MD;  Location: [facility];  Service: Gastroenterology;  Laterality: N/A;   ESOPHAGOGASTRODUODENOSCOPY  08/17/2015   Moderate hiatal hernia. Otherwise noraml EGD.    ESOPHAGOGASTRODUODENOSCOPY  09/03/2019   [place] [place]   ESOPHAGOGASTRODUODENOSCOPY (EGD) WITH PROPOFOL N/A 03/22/2021   Procedure: ESOPHAGOGASTRODUODENOSCOPY (EGD) WITH PROPOFOL;  Surgeon: Rush Landmark Telford Nab., MD;  Location: [facility];  Service: Gastroenterology;  Laterality: N/A;   EUS  05/19/2011   Procedure: UPPER ENDOSCOPIC ULTRASOUND (EUS) LINEAR;  Surgeon: Owens Loffler, MD;  Location: [facility];  Service: Endoscopy;  Laterality: N/A;  radial linear    HEMOSTASIS CLIP PLACEMENT  03/22/2021   Procedure: HEMOSTASIS CLIP PLACEMENT;  Surgeon: Irving Copas., MD;  Location: [facility];  Service: Gastroenterology;;   HERNIA REPAIR  05/09/2020   HYSTEROTOMY     KNEE SURGERY     LEFT HEART CATH AND CORONARY ANGIOGRAPHY     LEFT HEART CATH AND CORONARY ANGIOGRAPHY N/A 05/08/2019   Procedure: LEFT HEART CATH AND  CORONARY ANGIOGRAPHY;  Surgeon: Burnell Blanks, MD;  Location: [facility];  Service: Cardiovascular;  Laterality: N/A;   right thumb     tendon repaire   RIGHT/LEFT HEART CATH AND CORONARY ANGIOGRAPHY N/A 06/23/2020   Procedure: RIGHT/LEFT HEART CATH AND CORONARY ANGIOGRAPHY;  Surgeon: Martinique, Peter M, MD;  Location: [facility];  Service: Cardiovascular;  Laterality: N/A;   SHOULDER SURGERY     rotator cuff repair   SUBMUCOSAL LIFTING INJECTION  03/22/2021   Procedure: SUBMUCOSAL LIFTING INJECTION;  Surgeon: Irving Copas., MD;  Location: [facility];  Service: Gastroenterology;;   TRANSFORAMINAL LUMBAR INTERBODY FUSION L4-5   02/27/2020   WRIST SURGERY     rigth    Current Outpatient Medications  Medication Sig Dispense Refill   albuterol (PROVENTIL HFA;VENTOLIN HFA) 108 (90 Base) MCG/ACT inhaler Inhale 2 puffs into the lungs every 6 (six) hours as needed for wheezing or shortness of breath. 3 Inhaler 3   azelastine (ASTELIN) 0.1 % nasal spray Use two sprays in each nostril twice daily as directed for nasal drainage. 30 mL 5   BREZTRI AEROSPHERE 160-9-4.8 MCG/ACT AERO Inhale 1-2 puffs into the lungs daily.     buPROPion (WELLBUTRIN XL)  300 MG 24 hr tablet Take 300 mg by mouth daily.      canagliflozin (INVOKANA) 300 MG TABS tablet Take 300 mg by mouth daily.     celecoxib (CELEBREX) 100 MG capsule Take 100 mg by mouth 2 (two) times daily.     Cholecalciferol (VITAMIN D3) 1.25 MG (50000 UT) CAPS Take 50,000 Units by mouth every 30 (thirty) days.     clopidogrel (PLAVIX) 75 MG tablet Take 1 tablet (75 mg total) by mouth daily. 90 tablet 0   diclofenac Sodium (VOLTAREN) 1 % GEL Apply 1 g topically as needed (Joint pain).     donepezil (ARICEPT) 10 MG tablet Take 10 mg by mouth daily at 12 noon.     EPINEPHrine 0.3 mg/0.3 mL IJ SOAJ injection Inject 0.3 mg into the muscle as needed for anaphylaxis.     escitalopram (LEXAPRO) 20 MG tablet Take 20 mg by mouth  daily.     famotidine (PEPCID) 20 MG tablet Take 20 mg by mouth in the morning and at bedtime.     Fluticasone-Umeclidin-Vilant (TRELEGY ELLIPTA) 200-62.5-25 MCG/INH AEPB Inhale 1 puff into the lungs daily.     furosemide (LASIX) 20 MG tablet Take 20 mg by mouth daily.     isosorbide mononitrate (IMDUR) 120 MG 24 hr tablet Take 1 tablet by mouth once daily 60 tablet 6   levocetirizine (XYZAL) 5 MG tablet TAKE 1 TABLET BY MOUTH ONCE DAILY IN THE EVENING 30 tablet 0   lidocaine (LIDODERM) 5 % Place 1 patch onto the skin daily.     LINZESS 72 MCG capsule TAKE 1 CAPSULE BEFORE BREAKFAST 90 capsule 1   liraglutide (VICTOZA) 18 MG/3ML SOPN Inject 1.8 mg into the skin daily.     losartan-hydrochlorothiazide (HYZAAR) 100-25 MG tablet Take 1 tablet by mouth daily.     metoCLOPramide (REGLAN) 10 MG tablet Take 10 mg by mouth 4 (four) times daily as needed for nausea.     montelukast (SINGULAIR) 10 MG tablet Take 10 mg by mouth at bedtime.      Multiple Vitamin (MULTIVITAMIN) capsule Take 1 capsule by mouth daily.     nitroGLYCERIN (NITROSTAT) 0.4 MG SL tablet Place 0.4 mg under the tongue every 5 (five) minutes as needed for chest pain.     pantoprazole (PROTONIX) 40 MG tablet Take 1 tablet (40 mg total) by mouth 2 (two) times daily before a meal. 180 tablet 5   Potassium Chloride CR (MICRO-K) 8 MEQ CPCR capsule CR Take 1 capsule (8 mEq total) by mouth daily. 90 capsule 1   pregabalin (LYRICA) 50 MG capsule Take 50 mg by mouth daily.     rosuvastatin (CRESTOR) 20 MG tablet Take 20 mg by mouth daily.     traZODone (DESYREL) 50 MG tablet Take 50 mg by mouth at bedtime.     No current facility-administered medications for this visit.    Allergies as of 04/21/2022 - Review Complete 04/21/2022  Allergen Reaction Noted   Penicillins Rash 05/17/2011   Ampicillin Hives 11/10/2020   Tramadol Other (See Comments) 10/11/2019    Family History  Problem Relation Age of Onset   Diabetes Sister    Cancer  Sister    Hypertension Daughter    Colon cancer Neg Hx    Liver disease Neg Hx    Pancreatic cancer Neg Hx    Esophageal cancer Neg Hx     Social History   Socioeconomic History   Marital status: Single    Spouse name:  Not on file   Number of children: 3   Years of education: Not on file   Highest education level: Not on file  Occupational History   Occupation: retired  Tobacco Use   Smoking status: Former    Packs/day: 1.00    Types: Cigarettes    Quit date: 05/17/2011    Years since quitting: 10.9   Smokeless tobacco: Never  Vaping Use   Vaping Use: Never used  Substance and Sexual Activity   Alcohol use: No   Drug use: No   Sexual activity: Never  Other Topics Concern   Not on file  Social History Narrative   Right handed   Social Determinants of Health   Financial Resource Strain: Not on file  Food Insecurity: Not on file  Transportation Needs: Not on file  Physical Activity: Not on file  Stress: Not on file  Social Connections: Not on file  Intimate Partner Violence: Not on file    Review of Systems:    Constitutional: No weight loss, fever or chills Cardiovascular: No chest pain Respiratory: No SOB Gastrointestinal: See HPI and otherwise negative   Physical Exam:  Vital signs: BP 100/60   Pulse 72   Ht 5' 5.5" (1.664 m)   Wt 170 lb 6 oz (77.3 kg)   BMI 27.92 kg/m    Constitutional:   Pleasant AA female appears to be in NAD, Well developed, Well nourished, alert and cooperative Respiratory: Respirations even and unlabored. Lungs clear to auscultation bilaterally.   No wheezes, crackles, or rhonchi.  Cardiovascular: Normal S1, S2. No MRG. Regular rate and rhythm. No peripheral edema, cyanosis or pallor.  Gastrointestinal:  Soft, mild distension, nontender. No rebound or guarding. Normal bowel sounds. No appreciable masses or hepatomegaly. Rectal:  Not performed.  Psychiatric: Oriented to person, place and time. Demonstrates good judgement and  reason without abnormal affect or behaviors.  RELEVANT LABS AND IMAGING: CBC    Component Value Date/Time   WBC 4.7 12/21/2021 0000   WBC 5.5 03/26/2021 1640   RBC 4.02 12/21/2021 0000   HGB 11.5 (A) 12/21/2021 0000   HGB 10.7 (L) 06/17/2020 0000   HCT 36 12/21/2021 0000   HCT 33.7 (L) 06/17/2020 0000   PLT 269 12/21/2021 0000   PLT 329 06/17/2020 0000   MCV 90.0 03/26/2021 1640   MCV 81 06/17/2020 0000   MCH 25.8 (L) 06/17/2020 0000   MCH 30.6 05/09/2019 0402   MCHC 32.4 03/26/2021 1640   RDW 18.5 (H) 03/26/2021 1640   RDW 15.3 06/17/2020 0000   LYMPHSABS 1.5 03/26/2021 1640   LYMPHSABS 2.5 09/18/2017 0000   MONOABS 0.5 03/26/2021 1640   EOSABS 0.1 03/26/2021 1640   EOSABS 0.2 09/18/2017 0000   BASOSABS 0.0 03/26/2021 1640   BASOSABS 0.0 09/18/2017 0000    CMP     Component Value Date/Time   NA 139 12/21/2021 0000   K 3.1 (A) 12/21/2021 0000   CL 103 12/21/2021 0000   CO2 28 (A) 12/21/2021 0000   GLUCOSE 101 (H) 03/31/2021 0938   GLUCOSE 115 (H) 03/26/2021 1640   BUN 12 12/21/2021 0000   CREATININE 1.2 (A) 12/21/2021 0000   CREATININE 1.12 (H) 03/31/2021 0938   CREATININE 0.80 02/02/2018 1456   CALCIUM 9.5 12/21/2021 0000   PROT 6.4 02/03/2020 0937   ALBUMIN 4.1 12/21/2021 0000   ALBUMIN 4.1 02/03/2020 0937   AST 35 12/21/2021 0000   ALT 37 (A) 12/21/2021 0000   ALKPHOS 93 12/21/2021 0000  BILITOT 0.2 02/03/2020 0937   GFRNONAA 48 (L) 06/17/2020 0000   GFRAA 55 (L) 06/17/2020 0000    Assessment: 1.  Bloating/eructation/flatulence: Increased since patient stopped her Reglan for known gastroparesis, likely related with possibility of overlying SIBO 2.  Epigastric discomfort: With above 3.  Gastroparesis: As above  Plan: 1.  Unfortunately patient had to stop her Reglan due to tremors, since then she has had an increase in bloating, eructations and epigastric discomfort likely related to gastroparesis. 2.  Provided the patient with a gastroparesis diet  handout and discussed this.  Answered questions. 3.  We will increase Famotidine to 40 mg twice a day, 30-60 minutes before breakfast and dinner #60 with 11 refills. 4.  Continue Pantoprazole 40 mg twice daily 5.  Discussed possibility of SIBO and will use a breath test to consider this. 6.  Patient to follow in clinic with me in 6 to 8 weeks or sooner if necessary.  Ellouise Newer, PA-C [facility] 04/21/2022, 10:58 AM  Cc: [name], MD

## 2022-04-22 DIAGNOSIS — J301 Allergic rhinitis due to pollen: Secondary | ICD-10-CM | POA: Diagnosis not present

## 2022-04-24 NOTE — Progress Notes (Signed)
Agree with assessment/plan.  Raj Shaia Porath, MD [facility] [phone]  

## 2022-04-25 DIAGNOSIS — L728 Other follicular cysts of the skin and subcutaneous tissue: Secondary | ICD-10-CM | POA: Diagnosis not present

## 2022-04-25 DIAGNOSIS — L821 Other seborrheic keratosis: Secondary | ICD-10-CM | POA: Diagnosis not present

## 2022-04-25 DIAGNOSIS — Z1231 Encounter for screening mammogram for malignant neoplasm of breast: Secondary | ICD-10-CM | POA: Diagnosis not present

## 2022-04-26 DIAGNOSIS — J301 Allergic rhinitis due to pollen: Secondary | ICD-10-CM | POA: Diagnosis not present

## 2022-04-27 ENCOUNTER — Ambulatory Visit: Payer: Medicare HMO | Admitting: Neurology

## 2022-05-06 DIAGNOSIS — J4520 Mild intermittent asthma, uncomplicated: Secondary | ICD-10-CM | POA: Diagnosis not present

## 2022-05-06 DIAGNOSIS — G4733 Obstructive sleep apnea (adult) (pediatric): Secondary | ICD-10-CM | POA: Diagnosis not present

## 2022-05-06 DIAGNOSIS — R918 Other nonspecific abnormal finding of lung field: Secondary | ICD-10-CM | POA: Diagnosis not present

## 2022-05-06 DIAGNOSIS — R5383 Other fatigue: Secondary | ICD-10-CM | POA: Diagnosis not present

## 2022-05-06 DIAGNOSIS — Z8616 Personal history of COVID-19: Secondary | ICD-10-CM | POA: Diagnosis not present

## 2022-05-06 DIAGNOSIS — J301 Allergic rhinitis due to pollen: Secondary | ICD-10-CM | POA: Diagnosis not present

## 2022-05-13 ENCOUNTER — Telehealth: Payer: Self-pay | Admitting: Cardiology

## 2022-05-13 DIAGNOSIS — R0600 Dyspnea, unspecified: Secondary | ICD-10-CM | POA: Diagnosis not present

## 2022-05-13 DIAGNOSIS — J301 Allergic rhinitis due to pollen: Secondary | ICD-10-CM | POA: Diagnosis not present

## 2022-05-13 DIAGNOSIS — G4733 Obstructive sleep apnea (adult) (pediatric): Secondary | ICD-10-CM | POA: Diagnosis not present

## 2022-05-13 NOTE — Telephone Encounter (Signed)
Spoke with pt who states she has intermittent dull chest pain that is non radiating. Pt denies shortness of breath, nausea/vomiting or diaphoresis. Advised if pain returns use NTG and call 911 got to the ED. Appointment made with Dr. Geraldo Pitter for 05/19/22. Pt verbalized understanding and had no additional questions.

## 2022-05-13 NOTE — Telephone Encounter (Signed)
Pt c/o of Chest Pain: STAT if CP now or developed within 24 hours  1. Are you having CP right now? no  2. Are you experiencing any other symptoms (ex. SOB, nausea, vomiting, sweating)? no  3. How long have you been experiencing CP? Couples months   4. Is your CP continuous or coming and going? Coming and going   5. Have you taken Nitroglycerin? no ?

## 2022-05-19 ENCOUNTER — Encounter: Payer: Self-pay | Admitting: Cardiology

## 2022-05-19 ENCOUNTER — Ambulatory Visit: Payer: Medicare HMO | Attending: Cardiology | Admitting: Cardiology

## 2022-05-19 VITALS — BP 108/74 | HR 73 | Ht 65.0 in | Wt 171.6 lb

## 2022-05-19 DIAGNOSIS — I2510 Atherosclerotic heart disease of native coronary artery without angina pectoris: Secondary | ICD-10-CM | POA: Diagnosis not present

## 2022-05-19 DIAGNOSIS — I350 Nonrheumatic aortic (valve) stenosis: Secondary | ICD-10-CM | POA: Diagnosis not present

## 2022-05-19 DIAGNOSIS — I10 Essential (primary) hypertension: Secondary | ICD-10-CM

## 2022-05-19 DIAGNOSIS — E785 Hyperlipidemia, unspecified: Secondary | ICD-10-CM

## 2022-05-19 DIAGNOSIS — J309 Allergic rhinitis, unspecified: Secondary | ICD-10-CM | POA: Diagnosis not present

## 2022-05-19 DIAGNOSIS — E559 Vitamin D deficiency, unspecified: Secondary | ICD-10-CM | POA: Diagnosis not present

## 2022-05-19 DIAGNOSIS — E1140 Type 2 diabetes mellitus with diabetic neuropathy, unspecified: Secondary | ICD-10-CM | POA: Diagnosis not present

## 2022-05-19 DIAGNOSIS — G4733 Obstructive sleep apnea (adult) (pediatric): Secondary | ICD-10-CM | POA: Diagnosis not present

## 2022-05-19 DIAGNOSIS — M5440 Lumbago with sciatica, unspecified side: Secondary | ICD-10-CM | POA: Diagnosis not present

## 2022-05-19 DIAGNOSIS — Z79899 Other long term (current) drug therapy: Secondary | ICD-10-CM | POA: Diagnosis not present

## 2022-05-19 DIAGNOSIS — R413 Other amnesia: Secondary | ICD-10-CM | POA: Diagnosis not present

## 2022-05-19 DIAGNOSIS — M159 Polyosteoarthritis, unspecified: Secondary | ICD-10-CM | POA: Diagnosis not present

## 2022-05-19 DIAGNOSIS — Z6828 Body mass index (BMI) 28.0-28.9, adult: Secondary | ICD-10-CM | POA: Diagnosis not present

## 2022-05-19 NOTE — Patient Instructions (Addendum)
Medication Instructions:  Your physician recommends that you continue on your current medications as directed. Please refer to the Current Medication list given to you today.  *If you need a refill on your cardiac medications before your next appointment, please call your pharmacy*   Lab Work: None ordered If you have labs (blood work) drawn today and your tests are completely normal, you will receive your results only by: [place] (if you have MyChart) OR A paper copy in the mail If you have any lab test that is abnormal or we need to change your treatment, we will call you to review the results.   Testing/Procedures: Your physician has requested that you have an echocardiogram. Echocardiography is a painless test that uses sound waves to create images of your heart. It provides your doctor with information about the size and shape of your heart and how well your heart's chambers and valves are working. This procedure takes approximately one hour. There are no restrictions for this procedure. Please do NOT wear cologne, perfume, aftershave, or lotions (deodorant is allowed). Please arrive 15 minutes prior to your appointment time.    Follow-Up: At [facility], you and your health needs are our priority.  As part of our continuing mission to provide you with exceptional heart care, we have created designated Provider Care Teams.  These Care Teams include your primary Cardiologist (physician) and Advanced Practice Providers (APPs -  Physician Assistants and Nurse Practitioners) who all work together to provide you with the care you need, when you need it.  We recommend signing up for the patient portal called "MyChart".  Sign up information is provided on this After Visit Summary.  MyChart is used to connect with patients for Virtual Visits (Telemedicine).  Patients are able to view lab/test results, encounter notes, upcoming appointments, etc.  Non-urgent messages can be sent  to your provider as well.   To learn more about what you can do with MyChart, go to [email or web address].    Your next appointment:   9 month(s)  The format for your next appointment:   In Person  Provider:   Jyl Heinz, MD    Other Instructions none  Important Information About [place]

## 2022-05-19 NOTE — Progress Notes (Signed)
Cardiology Office Note:    Date:  05/19/2022   ID:  Andreas Blower, DOB Aug 08, 1945, MRN [id number]  PCP:  [name], MD  Cardiologist:  [name], MD   Referring MD: [name], MD    ASSESSMENT:    1. Essential hypertension   2. Coronary artery disease involving native coronary artery of native heart without angina pectoris   3. OSA (obstructive sleep apnea)   4. Dyslipidemia   5. Moderate aortic stenosis    PLAN:    In order of problems listed above:  Coronary artery disease: Stable angina pectoris: Secondary prevention stressed with the patient.  Importance of compliance with diet medication stressed and she vocalized understanding.  Patient mentions to me that she has chest pain which appears atypical to me in her description.  It happens once in 3 to 4 months.  She does not use nitroglycerin.  It is not related to exertion and it goes away.  Recent stress test was unremarkable and the coronary angiography done in the fairly recent past was unremarkable.  I reassured her and her daughter about it and vocalized understanding and questions were answered to their satisfaction. Essential hypertension: Blood pressure stable and diet was emphasized. Mixed dyslipidemia: Stable at this time.  Lipids reviewed from [facility] sheet and discussed with patient.  She will get rechecked with primary care in another 3 to 4 months. Obstructive sleep apnea: Sleep health issues were discussed. Mild to moderate aortic stenosis: Echocardiogram will be done to follow-up on this.  She denies any history of angina, dyspnea on exertion or syncope I educated her and her family about this. Patient will be seen in follow-up appointment in 9 months or earlier if the patient has any concerns    Medication Adjustments/Labs and Tests Ordered: Current medicines are reviewed at length with the patient today.  Concerns regarding medicines are outlined above.  No orders of the defined types were placed  in this encounter.  No orders of the defined types were placed in this encounter.    No chief complaint on file.    History of Present Illness:    Deborah Hutchinson is a 77 y.o. female.  Patient has past medical history of mild to moderate aortic stenosis, essential hypertension, dyslipidemia.  Her daughter accompanies her for this visit.  She ambulates with a walker because of orthopedic issues.  She is seeing her primary care provider today.  She denies any chest pain orthopnea or PND.  At the time of my evaluation, the patient is alert awake oriented and in no distress.  Past Medical History:  Diagnosis Date   Aftercare following surgery 03/26/2020   Allergy    Angina pectoris ([place]) 06/17/2020   Anxiety    Arthritis    Asthma    Bronchitis    CAD (coronary artery disease) 02/13/2019   Chest pain 05/08/2019   Chest pain of uncertain etiology    Chronic bilateral low back pain without sciatica 07/11/2019   Formatting of this note might be different from the original. Added automatically from request for surgery [id number]   Chronic obstructive pulmonary disease ([place]) 02/09/2017   Formatting of this note might be different from the original. Last Assessment & Plan:  I will review her PFTs after I obtain records from her pulmonologist. Based on this we will decide if she needs inhalers, subjectively they do not seem to have helped her at all. She may benefit from a pulmonary rehabilitation program Formatting of  this note might be different from the original. Last Assessment    COPD (chronic obstructive pulmonary disease) ([place]) 02/09/2017   Coronary artery disease involving native coronary artery of native heart without angina pectoris 06/10/2015   Diabetes mellitus due to underlying condition with unspecified complications ([place]) 67/20/9470   Dyslipidemia 06/10/2015   Esophageal dysphagia 09/02/2019   Formatting of this note might be different from the original. Added automatically  from request for surgery [id number]   Essential hypertension 06/10/2015   Gastroparesis 09/04/2019   GERD (gastroesophageal reflux disease)    H/O hiatal hernia    Headache 09/24/2018   Heart murmur    Hyperlipemia    Hypertension    Iron deficiency anemia 09/25/2020   Lumbar radiculopathy 12/18/2020   Mixed dyslipidemia 06/10/2015   Moderate aortic stenosis 05/16/2019   Nausea and vomiting 06/16/2020   Neurogenic claudication 12/18/2020   OSA (obstructive sleep apnea) 02/09/2017   Pain and swelling of toe of right foot 07/15/2016   Palpitations 06/02/2016   Paraesophageal hernia 10/30/2019   Formatting of this note might be different from the original. Added automatically from request for surgery [id number]   Peritoneal mesothelioma ([place]) 01/16/2020   S/P lumbar fusion 03/26/2020   Shortness of breath    on  excertion   Sleep apnea     Past Surgical History:  Procedure Laterality Date   ABDOMINAL HYSTERECTOMY     ABDOMINAL HYSTERECTOMY     BIOPSY  03/22/2021   Procedure: BIOPSY;  Surgeon: Irving Copas., MD;  Location: [facility];  Service: Gastroenterology;;   BREAST SURGERY     begin mass,left  breast   COLONOSCOPY  08/17/2015   Colonic polyp status post polypectomy. Mild pancolonic diverticulosis. Highly redundant colon.    DILATION AND CURETTAGE OF UTERUS     one   ENDOSCOPIC MUCOSAL RESECTION N/A 03/22/2021   Procedure: ENDOSCOPIC MUCOSAL RESECTION;  Surgeon: Rush Landmark Telford Nab., MD;  Location: [facility];  Service: Gastroenterology;  Laterality: N/A;   ESOPHAGOGASTRODUODENOSCOPY  08/17/2015   Moderate hiatal hernia. Otherwise [place]aml EGD.    ESOPHAGOGASTRODUODENOSCOPY  09/03/2019   [place]   ESOPHAGOGASTRODUODENOSCOPY (EGD) WITH PROPOFOL N/A 03/22/2021   Procedure: ESOPHAGOGASTRODUODENOSCOPY (EGD) WITH PROPOFOL;  Surgeon: Rush Landmark Telford Nab., MD;  Location: [facility];  Service: Gastroenterology;  Laterality: N/A;   EUS  05/19/2011    Procedure: UPPER ENDOSCOPIC ULTRASOUND (EUS) LINEAR;  Surgeon: Owens Loffler, MD;  Location: [facility];  Service: Endoscopy;  Laterality: N/A;  radial linear    HEMOSTASIS CLIP PLACEMENT  03/22/2021   Procedure: HEMOSTASIS CLIP PLACEMENT;  Surgeon: Irving Copas., MD;  Location: [facility];  Service: Gastroenterology;;   HERNIA REPAIR  05/09/2020   HYSTEROTOMY     KNEE SURGERY     LEFT HEART CATH AND CORONARY ANGIOGRAPHY     LEFT HEART CATH AND CORONARY ANGIOGRAPHY N/A 05/08/2019   Procedure: LEFT HEART CATH AND CORONARY ANGIOGRAPHY;  Surgeon: Burnell Blanks, MD;  Location: [facility];  Service: Cardiovascular;  Laterality: N/A;   right thumb     tendon repaire   RIGHT/LEFT HEART CATH AND CORONARY ANGIOGRAPHY N/A 06/23/2020   Procedure: RIGHT/LEFT HEART CATH AND CORONARY ANGIOGRAPHY;  Surgeon: Martinique, Peter M, MD;  Location: [facility];  Service: Cardiovascular;  Laterality: N/A;   SHOULDER SURGERY     rotator cuff repair   SUBMUCOSAL LIFTING INJECTION  03/22/2021   Procedure: SUBMUCOSAL LIFTING INJECTION;  Surgeon: Irving Copas., MD;  Location: [facility];  Service: Gastroenterology;;   TRANSFORAMINAL LUMBAR INTERBODY FUSION L4-5   02/27/2020   WRIST SURGERY     [place]    Current Medications: Current Meds  Medication Sig   albuterol (PROVENTIL HFA;VENTOLIN HFA) 108 (90 Base) MCG/ACT inhaler Inhale 2 puffs into the lungs every 6 (six) hours as needed for wheezing or shortness of breath.   azelastine (ASTELIN) 0.1 % nasal spray Use two sprays in each nostril twice daily as directed for nasal drainage.   BREZTRI AEROSPHERE 160-9-4.8 MCG/ACT AERO Inhale 1-2 puffs into the lungs daily.   buPROPion (WELLBUTRIN XL) 300 MG 24 hr tablet Take 300 mg by mouth daily.    canagliflozin (INVOKANA) 300 MG TABS tablet Take 300 mg by mouth daily.   celecoxib (CELEBREX) 100 MG capsule Take 100 mg by mouth 2 (two) times daily.   Cholecalciferol  (VITAMIN D3) 1.25 MG (50000 UT) CAPS Take 50,000 Units by mouth every 30 (thirty) days.   clopidogrel (PLAVIX) 75 MG tablet Take 1 tablet (75 mg total) by mouth daily.   diclofenac Sodium (VOLTAREN) 1 % GEL Apply 1 g topically as needed (Joint pain).   donepezil (ARICEPT) 10 MG tablet Take 10 mg by mouth daily at 12 noon.   EPINEPHrine 0.3 mg/0.3 mL IJ SOAJ injection Inject 0.3 mg into the muscle as needed for anaphylaxis.   escitalopram (LEXAPRO) 20 MG tablet Take 20 mg by mouth daily.   famotidine (PEPCID) 40 MG tablet Take 1 tablet (40 mg total) by mouth 2 (two) times daily.   Fluticasone-Umeclidin-Vilant (TRELEGY ELLIPTA) 200-62.5-25 MCG/INH AEPB Inhale 1 puff into the lungs daily.   furosemide (LASIX) 20 MG tablet Take 20 mg by mouth daily.   isosorbide mononitrate (IMDUR) 120 MG 24 hr tablet Take 1 tablet by mouth once daily   levocetirizine (XYZAL) 5 MG tablet TAKE 1 TABLET BY MOUTH ONCE DAILY IN THE EVENING   lidocaine (LIDODERM) 5 % Place 1 patch onto the skin daily.   LINZESS 72 MCG capsule TAKE 1 CAPSULE BEFORE BREAKFAST   liraglutide (VICTOZA) 18 MG/3ML SOPN Inject 1.8 mg into the skin daily.   losartan-hydrochlorothiazide (HYZAAR) 100-25 MG tablet Take 1 tablet by mouth daily.   metoCLOPramide (REGLAN) 10 MG tablet Take 10 mg by mouth 4 (four) times daily as needed for nausea.   montelukast (SINGULAIR) 10 MG tablet Take 10 mg by mouth at bedtime.    Multiple Vitamin (MULTIVITAMIN) capsule Take 1 capsule by mouth daily.   nitroGLYCERIN (NITROSTAT) 0.4 MG SL tablet Place 0.4 mg under the tongue every 5 (five) minutes as needed for chest pain.   pantoprazole (PROTONIX) 40 MG tablet Take 1 tablet (40 mg total) by mouth 2 (two) times daily before a meal.   Potassium Chloride CR (MICRO-K) 8 MEQ CPCR capsule CR Take 1 capsule (8 mEq total) by mouth daily.   pregabalin (LYRICA) 50 MG capsule Take 50 mg by mouth daily.   rosuvastatin (CRESTOR) 20 MG tablet Take 20 mg by mouth daily.    traZODone (DESYREL) 50 MG tablet Take 50 mg by mouth at bedtime.     Allergies:   Penicillins, Ampicillin, and Tramadol   Social History   Socioeconomic History   Marital status: Single    Spouse name: Not on file   Number of children: 3   Years of education: Not on file   Highest education level: Not on file  Occupational History   Occupation: retired  Tobacco Use   Smoking status: Former    Packs/day:  1.00    Types: Cigarettes    Quit date: 05/17/2011    Years since quitting: 11.0   Smokeless tobacco: Never  Vaping Use   Vaping Use: Never used  Substance and Sexual Activity   Alcohol use: No   Drug use: No   Sexual activity: Never  Other Topics Concern   Not on file  Social History Narrative   Right handed   Social Determinants of Health   Financial Resource Strain: Not on file  Food Insecurity: Not on file  Transportation Needs: Not on file  Physical Activity: Not on file  Stress: Not on file  Social Connections: Not on file     Family History: The patient's family history includes Cancer in her sister; Diabetes in her sister; Hypertension in her daughter. There is no history of Colon cancer, Liver disease, Pancreatic cancer, or Esophageal cancer.  ROS:   Please see the history of present illness.    All other systems reviewed and are negative.  EKGs/Labs/Other Studies Reviewed:    The following studies were reviewed today: EKG reveals sinus rhythm and nonspecific ST-T changes   Recent Labs: 12/21/2021: ALT 37; BUN 12; Creatinine 1.2; Hemoglobin 11.5; Platelets 269; Potassium 3.1; Sodium 139  Recent Lipid Panel    Component Value Date/Time   CHOL 159 02/03/2020 0937   TRIG 65 02/03/2020 0937   HDL 82 02/03/2020 0937   CHOLHDL 1.9 02/03/2020 0937   LDLCALC 64 02/03/2020 0937    Physical Exam:    VS:  BP 108/74   Pulse 73   Ht '5\' 5"'$  (1.651 m)   Wt 171 lb 9.6 oz (77.8 kg)   SpO2 99%   BMI 28.56 kg/m     Wt Readings from Last 3 Encounters:   05/19/22 171 lb 9.6 oz (77.8 kg)  04/21/22 170 lb 6 oz (77.3 kg)  12/21/21 173 lb 12.8 oz (78.8 kg)     GEN: Patient is in no acute distress HEENT: Normal NECK: No JVD; No carotid bruits LYMPHATICS: No lymphadenopathy CARDIAC: Hear sounds regular, 2/6 systolic murmur at the apex.  2/6 systolic murmur at the aortic area RESPIRATORY:  Clear to auscultation without rales, wheezing or rhonchi  ABDOMEN: Soft, non-tender, non-distended MUSCULOSKELETAL:  No edema; No deformity  SKIN: Warm and dry NEUROLOGIC:  Alert and oriented x 3 PSYCHIATRIC:  Normal affect   Signed, [name], MD  05/19/2022 8:39 AM    [place]

## 2022-05-20 LAB — HEMOGLOBIN A1C: A1c: 6.2

## 2022-05-20 LAB — MICROALBUMIN / CREATININE URINE RATIO
Albumin, Urine POC: 11
Albumin/Creatinine Ratio, Urine, POC: 6
Creatinine, Urine.: 182.8

## 2022-05-20 LAB — GENERAL HEALTH PANEL: EGFR: 34

## 2022-05-26 ENCOUNTER — Encounter: Payer: Self-pay | Admitting: Physician Assistant

## 2022-05-26 ENCOUNTER — Ambulatory Visit (INDEPENDENT_AMBULATORY_CARE_PROVIDER_SITE_OTHER): Payer: Medicare HMO | Admitting: Physician Assistant

## 2022-05-26 ENCOUNTER — Ambulatory Visit: Payer: Medicare HMO

## 2022-05-26 VITALS — BP 110/70 | HR 80 | Ht 65.5 in | Wt 169.1 lb

## 2022-05-26 DIAGNOSIS — R143 Flatulence: Secondary | ICD-10-CM

## 2022-05-26 DIAGNOSIS — R141 Gas pain: Secondary | ICD-10-CM | POA: Diagnosis not present

## 2022-05-26 DIAGNOSIS — R142 Eructation: Secondary | ICD-10-CM

## 2022-05-26 DIAGNOSIS — R140 Abdominal distension (gaseous): Secondary | ICD-10-CM | POA: Diagnosis not present

## 2022-05-26 DIAGNOSIS — K3184 Gastroparesis: Secondary | ICD-10-CM

## 2022-05-26 NOTE — Progress Notes (Signed)
Agree with assessment/plan.  Raj Canna Nickelson, MD [facility] [phone]  

## 2022-05-26 NOTE — Patient Instructions (Signed)
You have been given a testing kit to check for small intestine bacterial overgrowth (SIBO) which is completed by a company named [facility]. Make sure to return your test in the mail using the return mailing label given to you along with the kit. Your demographic and insurance information have already been sent to the company and they should be in contact with you over the next 1-2 weeks regarding this test. [facility] will collect an upfront charge of $99.74 for commercial insurance plans and $209.74 is you are paying cash. Make sure to discuss with [facility] PRIOR to having the test to see if they have gotten information from your insurance company as to how much your testing will cost out of pocket, if any. Please keep in mind that you will be getting a call from phone number [phone] or a similar number. If you do not hear from them within this time frame, please call our office at [phone] or call [facility] directly at [phone].    Stop Caffeine and stop using straws to see if this will helping with gas and bloating.   Due to recent changes in healthcare laws, you may see the results of your imaging and laboratory studies on MyChart before your provider has had a chance to review them.  We understand that in some cases there may be results that are confusing or concerning to you. Not all laboratory results come back in the same time frame and the provider may be waiting for multiple results in order to interpret others.  Please give [place] 48 hours in order for your provider to thoroughly review all the results before contacting the office for clarification of your results.   _______________________________________________________  If your blood pressure at your visit was 140/90 or greater, please contact your primary care physician to follow up on this.  _______________________________________________________  If you are age 79 or older, your body mass index  should be between 23-30. Your Body mass index is 27.72 kg/m. If this is out of the aforementioned range listed, please consider follow up with your Primary Care Provider.  If you are age 59 or younger, your body mass index should be between 19-25. Your Body mass index is 27.72 kg/m. If this is out of the aformentioned range listed, please consider follow up with your Primary Care Provider.   ________________________________________________________  The [facility] providers would like to encourage you to use [facility] to communicate with providers for non-urgent requests or questions.  Due to long hold times on the telephone, sending your provider a message by [facility] may be a faster and more efficient way to get a response.  Please allow 48 business hours for a response.  Please remember that this is for non-urgent requests.  _______________________________________________________  Thank you for choosing me and [facility].  Ellouise Newer PA-C

## 2022-05-26 NOTE — Progress Notes (Signed)
Chief Complaint: Follow-up bloating, belching and gastroparesis  HPI:    Deborah Hutchinson is a 77 year old African-American female with a past medical history as listed below including CAD, COPD, diabetes, gastroparesis, esophageal dysphagia and multiple others, known to Dr. Lyndel Safe, who returns to clinic today for follow-up of her abdominal pain, gas and belching.   09/22/2021 patient seen in clinic by Dr. Lyndel Safe for recurrent symptomatic IDA with heme positive stools.  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 Rush Landmark and negative colonoscopy 12/2018.  History of GERD with large hiatal hernia status postrepair November 2021 with small residual/recurrent hernia on EGD with reflux esophagitis.  At that time continued on Pantoprazole 40 mg p.o. twice daily and Pepcid 20 mg nightly, recommended to VCE and discussed possible repeat EGD at [facility] long after Plavix washout.    10/06/21 capsule endoscopy was incomplete with gastric retention.  Discussed possible EGD/enteroscopy if needed and if unrevealing with endoscopic placement of capsule.    10/18/2021 abdominal x-ray with no endoscopic capsule identified, nonobstructive bowel gas pattern.    12/21/2021 CBC with a hemoglobin of 11.5 and normal iron panel and ferritin.    04/21/2022 patient seen in clinic by me and describes she had had to stop her Reglan in July/August that is causing some shakes, for the past month that started with belching and bloating and some epigastric pain with flatulence.  At that time increase Famotidine to 40 twice daily and continue Pantoprazole 40 twice daily and discussed SIBO testing.    Today, patient returns to clinic accompanied by her daughter who assists with history.  She explains that she has many of the same symptoms with a lot of eructations and gas.  She never did the SIBO testing.  The  Famotidine did increase did not seem to make much of a difference.  Does describe that she drinks a lot of caffeinated/carbonated beverages and drinks through a straw daily.    Denies fever, chills or weight loss.  Past Medical History:  Diagnosis Date   Aftercare following surgery 03/26/2020   Allergy    Angina pectoris ([place]) 06/17/2020   Anxiety    Arthritis    Asthma    Bronchitis    CAD (coronary artery disease) 02/13/2019   Chest pain 05/08/2019   Chest pain of uncertain etiology    Chronic bilateral low back pain without sciatica 07/11/2019   Formatting of this note might be different from the original. Added automatically from request for surgery [id number]   Chronic obstructive pulmonary disease ([place]) 02/09/2017   Formatting of this note might be different from the original. Last Assessment & Plan:  I will review her PFTs after I obtain records from her pulmonologist. Based on this we will decide if she needs inhalers, subjectively they do not seem to have helped her at all. She may benefit from a pulmonary rehabilitation program Formatting of this note might be different from the original. Last Assessment    COPD (chronic obstructive pulmonary disease) ([place]) 02/09/2017   Coronary artery disease involving native coronary artery of native heart without angina pectoris 06/10/2015   Diabetes mellitus due to underlying condition with unspecified complications ([place]) 93/81/8299   Dyslipidemia 06/10/2015   Esophageal dysphagia 09/02/2019   Formatting of this note might be different from the original. Added automatically from request for surgery [id number]   Essential hypertension 06/10/2015  Gastroparesis 09/04/2019   GERD (gastroesophageal reflux disease)    H/O hiatal hernia    Headache 09/24/2018   Heart murmur    Hyperlipemia    Hypertension    Iron deficiency anemia 09/25/2020   Lumbar radiculopathy 12/18/2020   Mixed dyslipidemia 06/10/2015   Moderate aortic stenosis 05/16/2019    Nausea and vomiting 06/16/2020   Neurogenic claudication 12/18/2020   OSA (obstructive sleep apnea) 02/09/2017   Pain and swelling of toe of right foot 07/15/2016   Palpitations 06/02/2016   Paraesophageal hernia 10/30/2019   Formatting of this note might be different from the original. Added automatically from request for surgery [id number]   Peritoneal mesothelioma ([place]) 01/16/2020   S/P lumbar fusion 03/26/2020   Shortness of breath    on  excertion   Sleep apnea     Past Surgical History:  Procedure Laterality Date   ABDOMINAL HYSTERECTOMY     ABDOMINAL HYSTERECTOMY     BIOPSY  03/22/2021   Procedure: BIOPSY;  Surgeon: Irving Copas., MD;  Location: [facility];  Service: Gastroenterology;;   BREAST SURGERY     begin mass,left  breast   COLONOSCOPY  08/17/2015   Colonic polyp status post polypectomy. Mild pancolonic diverticulosis. Highly redundant colon.    DILATION AND CURETTAGE OF UTERUS     one   ENDOSCOPIC MUCOSAL RESECTION N/A 03/22/2021   Procedure: ENDOSCOPIC MUCOSAL RESECTION;  Surgeon: Rush Landmark Telford Nab., MD;  Location: [facility];  Service: Gastroenterology;  Laterality: N/A;   ESOPHAGOGASTRODUODENOSCOPY  08/17/2015   Moderate hiatal hernia. Otherwise noraml EGD.    ESOPHAGOGASTRODUODENOSCOPY  09/03/2019   [place]   ESOPHAGOGASTRODUODENOSCOPY (EGD) WITH PROPOFOL N/A 03/22/2021   Procedure: ESOPHAGOGASTRODUODENOSCOPY (EGD) WITH PROPOFOL;  Surgeon: Rush Landmark Telford Nab., MD;  Location: [facility];  Service: Gastroenterology;  Laterality: N/A;   EUS  05/19/2011   Procedure: UPPER ENDOSCOPIC ULTRASOUND (EUS) LINEAR;  Surgeon: Owens Loffler, MD;  Location: [facility];  Service: Endoscopy;  Laterality: N/A;  radial linear    HEMOSTASIS CLIP PLACEMENT  03/22/2021   Procedure: HEMOSTASIS CLIP PLACEMENT;  Surgeon: Irving Copas., MD;  Location: [facility];  Service: Gastroenterology;;   HERNIA REPAIR  05/09/2020    HYSTEROTOMY     KNEE SURGERY     LEFT HEART CATH AND CORONARY ANGIOGRAPHY     LEFT HEART CATH AND CORONARY ANGIOGRAPHY N/A 05/08/2019   Procedure: LEFT HEART CATH AND CORONARY ANGIOGRAPHY;  Surgeon: Burnell Blanks, MD;  Location: [facility];  Service: Cardiovascular;  Laterality: N/A;   right thumb     tendon repaire   RIGHT/LEFT HEART CATH AND CORONARY ANGIOGRAPHY N/A 06/23/2020   Procedure: RIGHT/LEFT HEART CATH AND CORONARY ANGIOGRAPHY;  Surgeon: Martinique, Peter M, MD;  Location: [facility];  Service: Cardiovascular;  Laterality: N/A;   SHOULDER SURGERY     rotator cuff repair   SUBMUCOSAL LIFTING INJECTION  03/22/2021   Procedure: SUBMUCOSAL LIFTING INJECTION;  Surgeon: Irving Copas., MD;  Location: [facility];  Service: Gastroenterology;;   TRANSFORAMINAL LUMBAR INTERBODY FUSION L4-5   02/27/2020   WRIST SURGERY     rigth    Current Outpatient Medications  Medication Sig Dispense Refill   albuterol (PROVENTIL HFA;VENTOLIN HFA) 108 (90 Base) MCG/ACT inhaler Inhale 2 puffs into the lungs every 6 (six) hours as needed for wheezing or shortness of breath. 3 Inhaler 3   azelastine (ASTELIN) 0.1 % nasal spray Use two sprays in each nostril twice daily as directed for  nasal drainage. 30 mL 5   BREZTRI AEROSPHERE 160-9-4.8 MCG/ACT AERO Inhale 1-2 puffs into the lungs daily.     buPROPion (WELLBUTRIN XL) 300 MG 24 hr tablet Take 300 mg by mouth daily.      canagliflozin (INVOKANA) 300 MG TABS tablet Take 300 mg by mouth daily.     celecoxib (CELEBREX) 100 MG capsule Take 100 mg by mouth 2 (two) times daily.     Cholecalciferol (VITAMIN D3) 1.25 MG (50000 UT) CAPS Take 50,000 Units by mouth every 30 (thirty) days.     clopidogrel (PLAVIX) 75 MG tablet Take 1 tablet (75 mg total) by mouth daily. 90 tablet 0   diclofenac Sodium (VOLTAREN) 1 % GEL Apply 1 g topically as needed (Joint pain).     donepezil (ARICEPT) 10 MG tablet Take 10 mg by mouth daily at 12  noon.     EPINEPHrine 0.3 mg/0.3 mL IJ SOAJ injection Inject 0.3 mg into the muscle as needed for anaphylaxis.     escitalopram (LEXAPRO) 20 MG tablet Take 20 mg by mouth daily.     famotidine (PEPCID) 40 MG tablet Take 1 tablet (40 mg total) by mouth 2 (two) times daily. 60 tablet 11   Fluticasone-Umeclidin-Vilant (TRELEGY ELLIPTA) 200-62.5-25 MCG/INH AEPB Inhale 1 puff into the lungs daily.     furosemide (LASIX) 20 MG tablet Take 20 mg by mouth daily.     isosorbide mononitrate (IMDUR) 120 MG 24 hr tablet Take 1 tablet by mouth once daily 60 tablet 6   levocetirizine (XYZAL) 5 MG tablet TAKE 1 TABLET BY MOUTH ONCE DAILY IN THE EVENING 30 tablet 0   lidocaine (LIDODERM) 5 % Place 1 patch onto the skin daily.     LINZESS 72 MCG capsule TAKE 1 CAPSULE BEFORE BREAKFAST 90 capsule 1   liraglutide (VICTOZA) 18 MG/3ML SOPN Inject 1.8 mg into the skin daily.     losartan-hydrochlorothiazide (HYZAAR) 100-25 MG tablet Take 1 tablet by mouth daily.     metoCLOPramide (REGLAN) 10 MG tablet Take 10 mg by mouth 4 (four) times daily as needed for nausea.     montelukast (SINGULAIR) 10 MG tablet Take 10 mg by mouth at bedtime.      Multiple Vitamin (MULTIVITAMIN) capsule Take 1 capsule by mouth daily.     nitroGLYCERIN (NITROSTAT) 0.4 MG SL tablet Place 0.4 mg under the tongue every 5 (five) minutes as needed for chest pain.     pantoprazole (PROTONIX) 40 MG tablet Take 1 tablet (40 mg total) by mouth 2 (two) times daily before a meal. 180 tablet 5   Potassium Chloride CR (MICRO-K) 8 MEQ CPCR capsule CR Take 1 capsule (8 mEq total) by mouth daily. 90 capsule 1   pregabalin (LYRICA) 50 MG capsule Take 50 mg by mouth daily.     rosuvastatin (CRESTOR) 20 MG tablet Take 20 mg by mouth daily.     traZODone (DESYREL) 50 MG tablet Take 50 mg by mouth at bedtime.     No current facility-administered medications for this visit.    Allergies as of 05/26/2022 - Review Complete 05/26/2022  Allergen Reaction Noted    Penicillins Rash 05/17/2011   Ampicillin Hives 11/10/2020   Tramadol Other (See Comments) 10/11/2019    Family History  Problem Relation Age of Onset   Diabetes Sister    Cancer Sister    Hypertension Daughter    Colon cancer Neg Hx    Liver disease Neg Hx    Pancreatic cancer Neg  Hx    Esophageal cancer Neg Hx     Social History   Socioeconomic History   Marital status: Single    Spouse name: Not on file   Number of children: 3   Years of education: Not on file   Highest education level: Not on file  Occupational History   Occupation: retired  Tobacco Use   Smoking status: Former    Packs/day: 1.00    Types: Cigarettes    Quit date: 05/17/2011    Years since quitting: 11.0   Smokeless tobacco: Never  Vaping Use   Vaping Use: Never used  Substance and Sexual Activity   Alcohol use: No   Drug use: No   Sexual activity: Never  Other Topics Concern   Not on file  Social History Narrative   Right handed   Social Determinants of Health   Financial Resource Strain: Not on file  Food Insecurity: Not on file  Transportation Needs: Not on file  Physical Activity: Not on file  Stress: Not on file  Social Connections: Not on file  Intimate Partner Violence: Not on file    Review of Systems:    Constitutional: No weight loss, fever or chills Cardiovascular: No chest pain Respiratory: No SOB Gastrointestinal: See HPI and otherwise negative   Physical Exam:  Vital signs: BP 110/70   Pulse 80   Ht 5' 5.5" (1.664 m)   Wt 169 lb 2 oz (76.7 kg)   BMI 27.72 kg/m    Constitutional:   Pleasant Elderly AA female appears to be in NAD, Well developed, Well nourished, alert and cooperative Respiratory: Respirations even and unlabored. Lungs clear to auscultation bilaterally.   No wheezes, crackles, or rhonchi.  Cardiovascular: Normal S1, S2. No MRG. Regular rate and rhythm. No peripheral edema, cyanosis or pallor.  Gastrointestinal:  Soft, nondistended, nontender. No  rebound or guarding. Normal bowel sounds. No appreciable masses or hepatomegaly. Rectal:  Not performed.  Psychiatric: Demonstrates good judgement and reason without abnormal affect or behaviors.  No recent labs.  Assessment: 1.  Bloating/Eructation/flatulence: Continues, initially increased after she had to stop her Reglan due to tardive dyskinesia; likely lifestyle +/- gastroparesis +/- GERD 2.  Gastroparesis: With above  Plan: 1.  Ordered SIBO testing.  Discussed this in detail with the patient and her daughter. 2.  Reviewed the gastroparesis diet again. 3.  Continue Famotidine 40 twice daily and Pantoprazole 40 twice daily. 4.  Discussed anti-gas producing diet/foods, stop drinking through straws and stop drinking carbonated beverages 5.  Patient to follow in clinic per recommendations after SIBO test results as above.  Ellouise Newer, PA-C [facility] 05/26/2022, 11:33 AM  Cc: [name], MD

## 2022-06-03 ENCOUNTER — Ambulatory Visit: Payer: Medicare HMO | Attending: Cardiology

## 2022-06-03 DIAGNOSIS — I2510 Atherosclerotic heart disease of native coronary artery without angina pectoris: Secondary | ICD-10-CM | POA: Diagnosis not present

## 2022-06-03 DIAGNOSIS — I350 Nonrheumatic aortic (valve) stenosis: Secondary | ICD-10-CM

## 2022-06-03 LAB — ECHOCARDIOGRAM COMPLETE
AR max vel: 1.72 cm2
AV Area VTI: 1.77 cm2
AV Area mean vel: 1.54 cm2
AV Mean grad: 16.4 mmHg
AV Peak grad: 26.8 mmHg
Ao pk vel: 2.59 m/s
Area-P 1/2: 1.65 cm2
S' Lateral: 3.2 cm

## 2022-06-10 DIAGNOSIS — G4733 Obstructive sleep apnea (adult) (pediatric): Secondary | ICD-10-CM | POA: Diagnosis not present

## 2022-06-10 DIAGNOSIS — Z8616 Personal history of COVID-19: Secondary | ICD-10-CM | POA: Diagnosis not present

## 2022-06-10 DIAGNOSIS — R5383 Other fatigue: Secondary | ICD-10-CM | POA: Diagnosis not present

## 2022-06-10 DIAGNOSIS — J4520 Mild intermittent asthma, uncomplicated: Secondary | ICD-10-CM | POA: Diagnosis not present

## 2022-06-10 DIAGNOSIS — J301 Allergic rhinitis due to pollen: Secondary | ICD-10-CM | POA: Diagnosis not present

## 2022-06-10 DIAGNOSIS — R918 Other nonspecific abnormal finding of lung field: Secondary | ICD-10-CM | POA: Diagnosis not present

## 2022-06-17 DIAGNOSIS — J301 Allergic rhinitis due to pollen: Secondary | ICD-10-CM | POA: Diagnosis not present

## 2022-06-20 ENCOUNTER — Other Ambulatory Visit: Payer: Self-pay | Admitting: Neurology

## 2022-06-20 DIAGNOSIS — R519 Headache, unspecified: Secondary | ICD-10-CM

## 2022-06-20 DIAGNOSIS — H9313 Tinnitus, bilateral: Secondary | ICD-10-CM

## 2022-06-22 ENCOUNTER — Telehealth: Payer: Self-pay | Admitting: Gastroenterology

## 2022-06-22 NOTE — Telephone Encounter (Signed)
Pt stated that she is having stomach pain for the last three days consistent, mid abdomen soreness. Last BM this AM, No nausea, No diarrhea,No Temp.  Chart reviewed.  Pt question if she has done the SIBO test. Pt stated that she forgot about it.  Please advise

## 2022-06-22 NOTE — Telephone Encounter (Signed)
PT is having severe stomach pains and wanted to schedule but there are no availabilities until March. Please advise

## 2022-06-22 NOTE — Telephone Encounter (Signed)
Pt stated that she has the SIBO test that is still in the box. Pt was encouraged to complete it and call the company if she has any questions: Pt verbalized understanding with all questions answered.

## 2022-06-22 NOTE — Progress Notes (Signed)
[facility]  [address] [place],  [place]  [address] [phone]  Clinic Day:  06/23/2022  Referring physician: [name], MD   HISTORY OF PRESENT ILLNESS:  The patient is a 77 y.o. female with a history of recurrent iron deficiency anemia.  Furthermore, a component of her anemia has been related to chronic renal insufficiency.  In the past, IV iron has been effective in replenishing her iron stores and normalizing her hemoglobin.  She comes in today to reassess her iron and hemoglobin levels.  Over the past few months, the patient complains of feeling weaker and colder than what she has been previously.  She also claims to have occasional black stools.  She has had multiple GI studies in the past, including her last EGD in November 2022 which revealed gastric polyps. Other prior EGDs have shown reflux esophagitis and a hiatal hernia.  A capsule endoscopy was attempted in 2023, but was not successful.  Of note, this patient does have a minimal amount of peritoneal mesothelioma, which has been followed conservatively at [facility].  VITALS:  Blood pressure (!) 163/74, pulse 76, temperature 98.6 F (37 C), resp. rate 16, height 5' 5.5" (1.664 m), weight 172 lb 14.4 oz (78.4 kg), SpO2 98 %.  Wt Readings from Last 3 Encounters:  06/23/22 172 lb 14.4 oz (78.4 kg)  05/26/22 169 lb 2 oz (76.7 kg)  05/19/22 171 lb 9.6 oz (77.8 kg)    Body mass index is 28.33 kg/m.  Performance status (ECOG): 1  PHYSICAL EXAM:  Physical Exam Constitutional:      General: She is not in acute distress.    Appearance: Normal appearance. She is normal weight.  HENT:     Head: Normocephalic and atraumatic.  Eyes:     General: No scleral icterus.    Extraocular Movements: Extraocular movements intact.     Conjunctiva/sclera: Conjunctivae normal.     Pupils: Pupils are equal, round, and reactive to light.  Cardiovascular:     Rate and Rhythm: Normal rate  and regular rhythm.     Pulses: Normal pulses.     Heart sounds: Normal heart sounds. No murmur heard.    No friction rub. No gallop.  Pulmonary:     Effort: Pulmonary effort is normal. No respiratory distress.     Breath sounds: Normal breath sounds.  Abdominal:     General: Bowel sounds are normal. There is no distension.     Palpations: Abdomen is soft. There is no hepatomegaly, splenomegaly or mass.     Tenderness: There is no abdominal tenderness.  Musculoskeletal:        General: Normal range of motion.     Cervical back: Normal range of motion and neck supple.     Right lower leg: No edema.     Left lower leg: No edema.  Lymphadenopathy:     Cervical: No cervical adenopathy.  Skin:    General: Skin is warm and dry.  Neurological:     General: No focal deficit present.     Mental Status: She is alert and oriented to person, place, and time. Mental status is at baseline.  Psychiatric:        Mood and Affect: Mood normal.        Behavior: Behavior normal.        Thought Content: Thought content normal.        Judgment: Judgment normal.    LABS:  Latest Reference Range & Units 06/23/22 00:00  WBC  7.4 (E)  RBC 3.87 - 5.11  3.60 ! (E)  Hemoglobin 12.0 - 16.0  8.8 ! (E)  HCT 36 - 46  28 ! (E)  Platelets 150 - 400 K/uL 370 (E)  !: Data is abnormal (E): External lab result  Latest Reference Range & Units 06/23/22 13:38  Iron 28 - 170 ug/dL 25 (L)  UIBC ug/dL 500  TIBC 250 - 450 ug/dL 525 (H)  Saturation Ratios 10.4 - 31.8 % 5 (L)  Ferritin 11 - 307 ng/mL 4 (L)  (L): Data is abnormally low (H): Data is abnormally high  ASSESSMENT & PLAN:  Assessment/Plan:  A 77 year old woman with recurrent iron deficiency anemia.  Once again, her iron parameters are very low and consistent with recurrent iron deficiency anemia.  Based upon this, I will make changes and provide a course of IV iron in the forthcoming weeks to rapidly replenish her iron stores and minimize her  hemoglobin.  Although she has had extensive GI workups in the past, the fact that her iron levels are low again warrants her to be seen by gastroenterology yet again.  I will arrange for her to be seen by them in the forthcoming weeks.  Otherwise, I will see this patient back in 3 months to reassess her iron and hemoglobin levels to see how well they responded to her upcoming IV iron.  The patient understands all the plans discussed today and is in agreement with them.   Atari Novick Macarthur Critchley, MD

## 2022-06-23 ENCOUNTER — Inpatient Hospital Stay: Payer: Medicare HMO

## 2022-06-23 ENCOUNTER — Inpatient Hospital Stay: Payer: Medicare HMO | Attending: Oncology | Admitting: Oncology

## 2022-06-23 VITALS — BP 163/74 | HR 76 | Temp 98.6°F | Resp 16 | Ht 65.5 in | Wt 172.9 lb

## 2022-06-23 DIAGNOSIS — N189 Chronic kidney disease, unspecified: Secondary | ICD-10-CM | POA: Insufficient documentation

## 2022-06-23 DIAGNOSIS — J439 Emphysema, unspecified: Secondary | ICD-10-CM | POA: Diagnosis not present

## 2022-06-23 DIAGNOSIS — D508 Other iron deficiency anemias: Secondary | ICD-10-CM

## 2022-06-23 DIAGNOSIS — C451 Mesothelioma of peritoneum: Secondary | ICD-10-CM | POA: Insufficient documentation

## 2022-06-23 DIAGNOSIS — D509 Iron deficiency anemia, unspecified: Secondary | ICD-10-CM | POA: Insufficient documentation

## 2022-06-23 DIAGNOSIS — R918 Other nonspecific abnormal finding of lung field: Secondary | ICD-10-CM | POA: Diagnosis not present

## 2022-06-23 LAB — IRON AND TIBC
Iron: 25 ug/dL — ABNORMAL LOW (ref 28–170)
Saturation Ratios: 5 % — ABNORMAL LOW (ref 10.4–31.8)
TIBC: 525 ug/dL — ABNORMAL HIGH (ref 250–450)
UIBC: 500 ug/dL

## 2022-06-23 LAB — CBC AND DIFFERENTIAL
HCT: 28 — AB (ref 36–46)
Hemoglobin: 8.8 — AB (ref 12.0–16.0)
Neutrophils Absolute: 4.51
Platelets: 370 10*3/uL (ref 150–400)
WBC: 7.4

## 2022-06-23 LAB — CBC: RBC: 3.6 — AB (ref 3.87–5.11)

## 2022-06-23 LAB — FERRITIN: Ferritin: 4 ng/mL — ABNORMAL LOW (ref 11–307)

## 2022-06-24 DIAGNOSIS — G4733 Obstructive sleep apnea (adult) (pediatric): Secondary | ICD-10-CM | POA: Diagnosis not present

## 2022-06-24 DIAGNOSIS — J4520 Mild intermittent asthma, uncomplicated: Secondary | ICD-10-CM | POA: Diagnosis not present

## 2022-06-24 DIAGNOSIS — Z8616 Personal history of COVID-19: Secondary | ICD-10-CM | POA: Diagnosis not present

## 2022-06-24 DIAGNOSIS — J301 Allergic rhinitis due to pollen: Secondary | ICD-10-CM | POA: Diagnosis not present

## 2022-06-24 DIAGNOSIS — R918 Other nonspecific abnormal finding of lung field: Secondary | ICD-10-CM | POA: Diagnosis not present

## 2022-06-24 DIAGNOSIS — R5383 Other fatigue: Secondary | ICD-10-CM | POA: Diagnosis not present

## 2022-06-26 ENCOUNTER — Encounter: Payer: Self-pay | Admitting: Oncology

## 2022-06-27 ENCOUNTER — Other Ambulatory Visit: Payer: Self-pay | Admitting: Pharmacist

## 2022-06-27 ENCOUNTER — Telehealth: Payer: Self-pay | Admitting: Cardiology

## 2022-06-27 DIAGNOSIS — H9313 Tinnitus, bilateral: Secondary | ICD-10-CM

## 2022-06-27 DIAGNOSIS — M7061 Trochanteric bursitis, right hip: Secondary | ICD-10-CM | POA: Diagnosis not present

## 2022-06-27 DIAGNOSIS — R519 Headache, unspecified: Secondary | ICD-10-CM

## 2022-06-27 DIAGNOSIS — M7062 Trochanteric bursitis, left hip: Secondary | ICD-10-CM | POA: Diagnosis not present

## 2022-06-27 MED ORDER — CLOPIDOGREL BISULFATE 75 MG PO TABS: 75.0000 mg | ORAL_TABLET | Freq: Every day | ORAL | 2 refills | Status: DC

## 2022-06-27 NOTE — Telephone Encounter (Signed)
*  STAT* If patient is at the pharmacy, call can be transferred to refill team.   1. Which medications need to be refilled? (please list name of each medication and dose if known)   clopidogrel (PLAVIX) 75 MG tablet   2. Which pharmacy/location (including street and city if local pharmacy) is medication to be sent to?  [place], [place]   3. Do they need a 30 day or 90 day supply?   90 day  Daughter stated the patient is completely out of this medication.

## 2022-06-27 NOTE — Telephone Encounter (Signed)
Refill of Plavix 75 mg snet to [facility].

## 2022-06-29 MED FILL — Ferumoxytol Inj 510 MG/17ML (30 MG/ML) (Elemental Fe): INTRAVENOUS | Qty: 17 | Status: AC

## 2022-06-30 ENCOUNTER — Inpatient Hospital Stay: Payer: Medicare HMO

## 2022-06-30 VITALS — BP 124/61 | HR 72 | Temp 97.7°F | Resp 18 | Ht 65.5 in | Wt 175.0 lb

## 2022-06-30 DIAGNOSIS — N189 Chronic kidney disease, unspecified: Secondary | ICD-10-CM | POA: Diagnosis not present

## 2022-06-30 DIAGNOSIS — C451 Mesothelioma of peritoneum: Secondary | ICD-10-CM | POA: Diagnosis not present

## 2022-06-30 DIAGNOSIS — D509 Iron deficiency anemia, unspecified: Secondary | ICD-10-CM | POA: Diagnosis not present

## 2022-06-30 DIAGNOSIS — D508 Other iron deficiency anemias: Secondary | ICD-10-CM

## 2022-06-30 MED ORDER — SODIUM CHLORIDE 0.9 % IV SOLN: Freq: Once | INTRAVENOUS | Status: AC

## 2022-06-30 MED ORDER — SODIUM CHLORIDE 0.9 % IV SOLN
510.0000 mg | Freq: Once | INTRAVENOUS | Status: AC
  Administered 2022-06-30: 510 mg via INTRAVENOUS
  Filled 2022-06-30: qty 17

## 2022-06-30 NOTE — Patient Instructions (Signed)

## 2022-07-04 ENCOUNTER — Telehealth: Payer: Self-pay | Admitting: Physician Assistant

## 2022-07-04 ENCOUNTER — Encounter: Payer: Self-pay | Admitting: Oncology

## 2022-07-04 MED FILL — Ferumoxytol Inj 510 MG/17ML (30 MG/ML) (Elemental Fe): INTRAVENOUS | Qty: 17 | Status: AC

## 2022-07-04 NOTE — Telephone Encounter (Signed)
PT is calling to speak about the breathing test she is supposed to  be having as well as her stomach pain.  Please advise.

## 2022-07-04 NOTE — Telephone Encounter (Signed)
Not so sure what it means. Diabetic patients do get bacterial overgrowth  Proceed with SIBO breath test as per protocol. RG

## 2022-07-04 NOTE — Telephone Encounter (Signed)
PT called back to let us know that the breathing test is not for diabetic patients and that they need another option. Please advise

## 2022-07-04 NOTE — Telephone Encounter (Signed)
Called and spoke with patient and her daughter. Pt has continued to have stomach pains so she was preparing to collect the samples for the SIBO breath test. In the instructions it states that diabetic patients must fast for 12-hours. I advised patient's daughter to contact [facility] directly, if they will not allow patient to fast for a shorter period of time they have been advised to call our office back and let us know. I provided patient's daughter with the phone # to [facility]. Pt's daughter verbalized understanding and had no concerns at the end of the call.

## 2022-07-05 ENCOUNTER — Ambulatory Visit: Payer: Medicare HMO | Admitting: Neurology

## 2022-07-05 ENCOUNTER — Inpatient Hospital Stay: Payer: Medicare HMO

## 2022-07-05 ENCOUNTER — Ambulatory Visit: Payer: Medicare HMO

## 2022-07-05 VITALS — BP 146/76 | HR 68 | Temp 98.2°F | Resp 16

## 2022-07-05 DIAGNOSIS — R918 Other nonspecific abnormal finding of lung field: Secondary | ICD-10-CM | POA: Diagnosis not present

## 2022-07-05 DIAGNOSIS — D508 Other iron deficiency anemias: Secondary | ICD-10-CM

## 2022-07-05 DIAGNOSIS — C451 Mesothelioma of peritoneum: Secondary | ICD-10-CM | POA: Diagnosis not present

## 2022-07-05 DIAGNOSIS — D509 Iron deficiency anemia, unspecified: Secondary | ICD-10-CM | POA: Diagnosis not present

## 2022-07-05 DIAGNOSIS — I2510 Atherosclerotic heart disease of native coronary artery without angina pectoris: Secondary | ICD-10-CM | POA: Diagnosis not present

## 2022-07-05 DIAGNOSIS — N189 Chronic kidney disease, unspecified: Secondary | ICD-10-CM | POA: Diagnosis not present

## 2022-07-05 DIAGNOSIS — D3502 Benign neoplasm of left adrenal gland: Secondary | ICD-10-CM | POA: Diagnosis not present

## 2022-07-05 DIAGNOSIS — I700 Atherosclerosis of aorta: Secondary | ICD-10-CM | POA: Diagnosis not present

## 2022-07-05 MED ORDER — SODIUM CHLORIDE 0.9 % IV SOLN: Freq: Once | INTRAVENOUS | Status: AC

## 2022-07-05 MED ORDER — SODIUM CHLORIDE 0.9 % IV SOLN
510.0000 mg | Freq: Once | INTRAVENOUS | Status: AC
  Administered 2022-07-05: 510 mg via INTRAVENOUS
  Filled 2022-07-05: qty 510

## 2022-07-05 NOTE — Telephone Encounter (Signed)
Incoming call from patient checking up on the other options available for her due to her not being able to do the SIBO breathing test. Please advise

## 2022-07-05 NOTE — Telephone Encounter (Signed)
Dr. Lyndel Safe, pt's daughter is concerned that patient will not be able to fast for the required 12 hours - she is an elderly diabetic. They are wondering if any medication can be prescribed without having to put her through the test. Please advise, thanks.

## 2022-07-05 NOTE — Patient Instructions (Signed)

## 2022-07-06 DIAGNOSIS — Z8616 Personal history of COVID-19: Secondary | ICD-10-CM | POA: Diagnosis not present

## 2022-07-06 DIAGNOSIS — J301 Allergic rhinitis due to pollen: Secondary | ICD-10-CM | POA: Diagnosis not present

## 2022-07-06 DIAGNOSIS — J4520 Mild intermittent asthma, uncomplicated: Secondary | ICD-10-CM | POA: Diagnosis not present

## 2022-07-06 DIAGNOSIS — R918 Other nonspecific abnormal finding of lung field: Secondary | ICD-10-CM | POA: Diagnosis not present

## 2022-07-06 DIAGNOSIS — G4733 Obstructive sleep apnea (adult) (pediatric): Secondary | ICD-10-CM | POA: Diagnosis not present

## 2022-07-06 DIAGNOSIS — R5383 Other fatigue: Secondary | ICD-10-CM | POA: Diagnosis not present

## 2022-07-06 NOTE — Telephone Encounter (Signed)
Please see note below and advise  

## 2022-07-07 NOTE — Telephone Encounter (Signed)
Just do SIBO breath test after overnight fast-first thing in the morning RG

## 2022-07-08 NOTE — Telephone Encounter (Signed)
Left message for pt daughter Barnett Applebaum to call back.

## 2022-07-08 NOTE — Telephone Encounter (Signed)
Deborah Hutchinson made aware of Dr. Lyndel Safe recommendations Deborah Hutchinson  verbalized understanding with all questions answered.

## 2022-07-13 DIAGNOSIS — R918 Other nonspecific abnormal finding of lung field: Secondary | ICD-10-CM | POA: Diagnosis not present

## 2022-07-13 DIAGNOSIS — G4733 Obstructive sleep apnea (adult) (pediatric): Secondary | ICD-10-CM | POA: Diagnosis not present

## 2022-07-13 DIAGNOSIS — Z8616 Personal history of COVID-19: Secondary | ICD-10-CM | POA: Diagnosis not present

## 2022-07-13 DIAGNOSIS — R5383 Other fatigue: Secondary | ICD-10-CM | POA: Diagnosis not present

## 2022-07-13 DIAGNOSIS — J301 Allergic rhinitis due to pollen: Secondary | ICD-10-CM | POA: Diagnosis not present

## 2022-07-13 DIAGNOSIS — J4520 Mild intermittent asthma, uncomplicated: Secondary | ICD-10-CM | POA: Diagnosis not present

## 2022-07-15 ENCOUNTER — Ambulatory Visit (INDEPENDENT_AMBULATORY_CARE_PROVIDER_SITE_OTHER): Payer: Medicare HMO | Admitting: Gastroenterology

## 2022-07-15 ENCOUNTER — Other Ambulatory Visit (INDEPENDENT_AMBULATORY_CARE_PROVIDER_SITE_OTHER): Payer: Medicare HMO

## 2022-07-15 ENCOUNTER — Encounter: Payer: Self-pay | Admitting: Gastroenterology

## 2022-07-15 VITALS — BP 122/78 | HR 65 | Ht 65.5 in | Wt 173.0 lb

## 2022-07-15 DIAGNOSIS — K3184 Gastroparesis: Secondary | ICD-10-CM

## 2022-07-15 DIAGNOSIS — R141 Gas pain: Secondary | ICD-10-CM

## 2022-07-15 DIAGNOSIS — R143 Flatulence: Secondary | ICD-10-CM | POA: Diagnosis not present

## 2022-07-15 DIAGNOSIS — R140 Abdominal distension (gaseous): Secondary | ICD-10-CM

## 2022-07-15 DIAGNOSIS — D509 Iron deficiency anemia, unspecified: Secondary | ICD-10-CM

## 2022-07-15 DIAGNOSIS — R195 Other fecal abnormalities: Secondary | ICD-10-CM

## 2022-07-15 DIAGNOSIS — K449 Diaphragmatic hernia without obstruction or gangrene: Secondary | ICD-10-CM

## 2022-07-15 DIAGNOSIS — K219 Gastro-esophageal reflux disease without esophagitis: Secondary | ICD-10-CM

## 2022-07-15 DIAGNOSIS — R142 Eructation: Secondary | ICD-10-CM

## 2022-07-15 DIAGNOSIS — C451 Mesothelioma of peritoneum: Secondary | ICD-10-CM | POA: Diagnosis not present

## 2022-07-15 LAB — COMPREHENSIVE METABOLIC PANEL
ALT: 32 U/L (ref 0–35)
AST: 23 U/L (ref 0–37)
Albumin: 3.7 g/dL (ref 3.5–5.2)
Alkaline Phosphatase: 95 U/L (ref 39–117)
BUN: 14 mg/dL (ref 6–23)
CO2: 25 mEq/L (ref 19–32)
Calcium: 9.6 mg/dL (ref 8.4–10.5)
Chloride: 109 mEq/L (ref 96–112)
Creatinine, Ser: 1.15 mg/dL (ref 0.40–1.20)
GFR: 45.47 mL/min — ABNORMAL LOW (ref >60.00)
Glucose, Bld: 92 mg/dL (ref 70–99)
Potassium: 4 mEq/L (ref 3.5–5.1)
Sodium: 142 mEq/L (ref 135–145)
Total Bilirubin: 0.3 mg/dL (ref 0.2–1.2)
Total Protein: 6.5 g/dL (ref 6.0–8.3)

## 2022-07-15 LAB — CBC WITH DIFFERENTIAL/PLATELET
Basophils Absolute: 0 10*3/uL (ref 0.0–0.1)
Basophils Relative: 0.4 % (ref 0.0–3.0)
Eosinophils Absolute: 0.2 10*3/uL (ref 0.0–0.7)
Eosinophils Relative: 2.3 % (ref 0.0–5.0)
HCT: 36.1 % (ref 36.0–46.0)
Hemoglobin: 11.3 g/dL — ABNORMAL LOW (ref 12.0–15.0)
Lymphocytes Relative: 15.1 % (ref 12.0–46.0)
Lymphs Abs: 1.1 10*3/uL (ref 0.7–4.0)
MCHC: 31.2 g/dL (ref 30.0–36.0)
MCV: 85.9 fl (ref 78.0–100.0)
Monocytes Absolute: 0.8 10*3/uL (ref 0.1–1.0)
Monocytes Relative: 11.1 % (ref 3.0–12.0)
Neutro Abs: 5.4 10*3/uL (ref 1.4–7.7)
Neutrophils Relative %: 71.1 % (ref 43.0–77.0)
Platelets: 292 10*3/uL (ref 150.0–400.0)
RBC: 4.21 Mil/uL (ref 3.87–5.11)
RDW: 28.9 % — ABNORMAL HIGH (ref 11.5–15.5)
WBC: 7.6 10*3/uL (ref 4.0–10.5)

## 2022-07-15 NOTE — Patient Instructions (Addendum)
_______________________________________________________  If your blood pressure at your visit was 140/90 or greater, please contact your primary care physician to follow up on this.  _______________________________________________________  If you are age 79 or older, your body mass index should be between 23-30. Your Body mass index is 28.35 kg/m. If this is out of the aforementioned range listed, please consider follow up with your Primary Care Provider.  If you are age 4 or younger, your body mass index should be between 19-25. Your Body mass index is 28.35 kg/m. If this is out of the aformentioned range listed, please consider follow up with your Primary Care Provider.   ________________________________________________________  The [facility] providers would like to encourage you to use [facility] to communicate with providers for non-urgent requests or questions.  Due to long hold times on the telephone, sending your provider a message by [facility] may be a faster and more efficient way to get a response.  Please allow 48 business hours for a response.  Please remember that this is for non-urgent requests.  _______________________________________________________  Your provider has requested that you go to the basement level for lab work before leaving today. Press "B" on the elevator. The lab is located at the first door on the left as you exit the elevator.  Continue Protonix and Pepcid  Continue IV iron  Please call in 2 weeks if you haven't heard anything from me regarding an Enteroscopy  You have been scheduled for an appointment with Dr. Lyndel Safe on 10-19-2022 at 9:10am . Please arrive 10 minutes early for your appointment.  Thank you,  Dr. Jackquline Denmark

## 2022-07-15 NOTE — Progress Notes (Signed)
IMPRESSION and PLAN:    #1.  Bloating/Eructation/flatulence: Likely due to aerophagia, likely functional.    #2. Gastroparesis. Reglan stopped d/t tardive dyskinesia.  #3. Recurrent symptomatic IDA with H+ stools. S/P multiple iron infusoins.  Followed by hematology. Neg PET 06/2022. CT chest Abdo/pelvis March 2023 shows peritoneal mesothelioma to be quiescent.  Majority of the hyperplastic gastric polyps were removed on EGD 03/2021 with Dr. Rush Landmark.  She had neg colonoscopy 12/2018. VCE 09/2021 was incomplete d/t gastric retention.  #2. GERD with large HH s/p repair Nov 2021 with small residual/recurrent hernia on EGD with reflux esophagitis.  #3. Peritoneal mesothelioma (low grade) at time of [facility] repair. Stable. Last CT chest/A/P March 2023 without progression. Being followed by Dr Clovis Riley. Neg PET 06/2022    Plan: -Continue Protonix '40mg'$  po QD -Pepcid 20 mg p.o. QHS -Continue IV iron as per hematology -Results of SIBO breath test -CBC, CMP. -Enteroscopy at [facility] with VCE capsule drop, off plavix x 5 days, after cardio clearence -RTC 12 weeks. -D/W pt and daughter. -If any active bleeding, they will promptly get in touch with [place]. HPI:    Chief Complaint:   Deborah Hutchinson is a 77 y.o. female with CAD on plavix, DM2, HH, OSA, COPD, LBP d/t OA, HTN, HLD  With recurrent symptomatic IDA despite IV iron  Hb 8.8, MCV 77 with ferritin 4, saturation 6% 06/23/2022 S/p another iron infusion yesterday   Had dark stools previously.  Note he she has been on p.o. iron as well.  Most recent CT chest Abdo/pelvis March 2023 shows peritoneal mesothelioma to be quiescent.  Majority of the hyperplastic gastric polyps were removed on EGD 03/2021 with Dr. Rush Landmark.  She had negative colonoscopy 12/2018 as below.  Had heartburn, postprandial abdominal bloating without nausea/vomiting.  No abdominal pain.  No diarrhea or constipation.  She denies having any weight loss.  Complains of  constant burping.  Continues to be Plavix  Also has mild renal insufficiency.  With H+ stools, likely d/t Mod sized gastric polyps (hyperplastic) in setting of Plavix for CAD. Neg colon 11/2020  No pica to ice.  No over-the-counter nonsteroidals.  Unfortunately had a fall with right face hematoma.    Previous notes: S/P HH repair by Dr. Abran Richard November 2021. Was found to have low-grade mesothelioma.  She is closely being followed by Dr. Clovis Riley.  No worsening. CT Abdo/pelvis March 2022, 07/2021 as below which did not show any progression.     Past GI procedures:  PET 06/2022 IMPRESSION:  1. No tracer avid solid nodule or mass identified.  2. As noted previously there are multiple ground-glass nodules  identified within both lungs. These do not demonstrate any  appreciable tracer uptake. The largest ground-glass nodule is in the  periphery of the left upper lobe measuring 1.3 cm. Absent FDG uptake  does not exclude the possibility of underlying indolent neoplastic  process such as pulmonary adenocarcinoma and continued interval  surveillance of these nodules is strongly recommended.  3. Stable left adrenal adenoma.  4. Coronary artery calcifications.  5.  Aortic Atherosclerosis (ICD10-I70.0).   CT chest/AP 07/2021 1.  No significant change in appearance of thickening of the diaphragm status post paraesophageal hernia repair. No definite new peritoneal disease or diaphragmatic nodularity identified.  2.  Similar groundglass opacities/nodules throughout the lungs bilaterally.  3.  Similar bilateral adrenal nodules. Can consider MR imaging for further characterization as clinically indicated.  EGD 03/2021 (Dr Rush Landmark) - No gross lesions  in esophagus proximally. LA Grade A esophagitis with no bleeding distally (this is improved from prior). - Z-line irregular, 34 cm from the incisors. - 3 cm hiatal hernia. - Four gastric polyps. Resected and retrieved. Clips (MR conditional)  were placed. - Gastritis. Biopsied. - No gross lesions in the duodenal bulb, in the first portion of the duodenum and in the second portion of the duodenum.   EGD 11/27/2020 - LA Grade C reflux esophagitis with no bleeding. Bx- neg - 3 cm hiatal hernia. S/P fundoplication. - 3 10-12 mm semi-sessile polyps with no bleeding and no stigmata of recent bleeding were found in the gastric body (1 at the diaphragmatic hiatus and the other 2 in proximal body of the stomach). Bx- Hyperplastic - Normal examined duodenum.  Colon 11/27/2020 - Seven 6 to 8 mm polyps in the mid descending colon, in the mid transverse colon, in the proximal ascending colon and in the mid ascending colon, removed with a cold snare. Resected and retrieved. Bx- TAs - Diverticulosis in the sigmoid colon and in the ascending colon.  EGD 08/2019: Candida esophagitis, no stricture, 7 cm hiatal hernia.  Treated empirically for Candida esophagitis  EGD 12/14/2018: Large HH, hyperplastic gastric polyps, eso stricture s/p dil 50 Fr. EGD 08/17/2015 mod HH, neg SB Bx for celiac.  UGI series 04/2019: HH, GERD, no strictures.  Ba tab passed without any problems.  Colonoscopy 12/14/2018-colonic polyps s/p polypectomy, mild pancolonic div. Bx- TA.  CT AP with contrast 02/09/2018: Neg, Stable left adrenal adenoma, small right inguinal hernia.  GES 08/2019: delayed gastric emptying (12% emptied at hr 1, 29% at hr 2, 47% at hr 3, 64% an hr 4).  CT AP 07/2020 Impression: 1. Mildly thickened appearance of the hemidiaphragms without a discrete measurable mass, overall similar relative to the prior CT dated 01/08/2020 in this patient with biopsy-proven peritoneal mesothelioma involving the right hemidiaphragm. No definite new peritoneal nodules are identified. 2. No ascites. 3. Similar groundglass opacities/nodules throughout the lungs bilaterally, most conspicuous in the right lower lobe. These findings may be infectious, inflammatory, or neoplastic  in etiology. 4. 4 mm solid right lower lobe pulmonary nodule, unchanged. The previously identified solid nodule in the right upper lobe is no longer seen. 5. Indeterminate bilateral adrenal lesions, measuring up to 1.7 cm on the left. Consider recharacterization with MRI is recommended. 6. Multiple additional ancillary findings, as above.   ============================  PATHOLOGY: PATHOLOGY: Final Pathologic Diagnosis  A. "Frozen peritoneal nodule":        Mesothelioma, epithelioid type, low tumor grade, trabecular pattern.       See comment.   B. "Peritoneal nodule":       Mesothelioma, epithelioid type, low tumor grade, trabecular pattern.       See comment.     LHC 05/08/2019: Prox RCA lesion is 50% stenosed. Prox LAD lesion is 40% stenosed. The left ventricular systolic function is normal. LV end diastolic pressure is normal. The left ventricular ejection fraction is greater than 65% by visual estimate. There is no mitral valve regurgitation. Past Medical History:  Diagnosis Date   Aftercare following surgery 03/26/2020   Allergy    Angina pectoris ([place]) 06/17/2020   Anxiety    Arthritis    Asthma    Bronchitis    CAD (coronary artery disease) 02/13/2019   Chest pain 05/08/2019   Chest pain of uncertain etiology    Chronic bilateral low back pain without sciatica 07/11/2019   Formatting of this note might be  different from the original. Added automatically from request for surgery [id number]   Chronic obstructive pulmonary disease ([place]) 02/09/2017   Formatting of this note might be different from the original. Last Assessment & Plan:  I will review her PFTs after I obtain records from her pulmonologist. Based on this we will decide if she needs inhalers, subjectively they do not seem to have helped her at all. She may benefit from a pulmonary rehabilitation program Formatting of this note might be different from the original. Last Assessment    COPD (chronic obstructive  pulmonary disease) ([place]) 02/09/2017   Coronary artery disease involving native coronary artery of native heart without angina pectoris 06/10/2015   Diabetes mellitus due to underlying condition with unspecified complications ([place]) [id number]   Dyslipidemia 06/10/2015   Esophageal dysphagia 09/02/2019   Formatting of this note might be different from the original. 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 Added automatically from request for surgery [id number]   Peritoneal mesothelioma ([place]) 01/16/2020   S/P lumbar fusion 03/26/2020   Shortness of breath    on  excertion   Sleep apnea     Current Outpatient Medications  Medication Sig Dispense Refill   albuterol (PROVENTIL HFA;VENTOLIN HFA) 108 (90 Base) MCG/ACT inhaler Inhale 2 puffs into the lungs every 6 (six) hours as needed for wheezing or shortness of breath. 3 Inhaler 3   amLODipine (NORVASC) 10 MG tablet Take 10 mg by mouth daily.     budesonide-formoterol (SYMBICORT) 160-4.5 MCG/ACT inhaler Inhale 2 puffs into the lungs 2 (two) times daily. 3 Inhaler 3   buPROPion (WELLBUTRIN XL) 300 MG 24 hr tablet Take 300 mg by mouth daily.      Cholecalciferol (VITAMIN D PO) Take 5,000 Units by mouth as directed. Every 2 weeks     clopidogrel (PLAVIX) 75 MG tablet Take 1 tablet (75 mg total) by mouth daily. 30  tablet 11   dicyclomine (BENTYL) 20 MG tablet Take 20 mg by mouth every 6 (six) hours as needed.            escitalopram (LEXAPRO) 10 MG tablet Take 10 mg by mouth daily.     famotidine (PEPCID) 40 MG tablet Take 1 tablet (40 mg total) by mouth at bedtime. 180 tablet 3   fluticasone (VERAMYST) 27.5 MCG/SPRAY nasal spray Place 2 sprays into the nose daily.     gabapentin (NEURONTIN) 300 MG capsule Take 300 mg by mouth 3 (three) times daily as needed.      hydrALAZINE (APRESOLINE) 50 MG tablet Take 1 tablet (50 mg total) by mouth 3 (three) times daily. 180 tablet 2   hydrochlorothiazide (HYDRODIURIL) 25 MG tablet Take 1 tablet (25 mg total) by mouth daily. 60 tablet 2   Lancets (ACCU-CHEK MULTICLIX) lancets      linaclotide (LINZESS) 72 MCG capsule Take 72 mcg by mouth  as needed.     losartan (COZAAR) 100 MG tablet Take 1 tablet (100 mg total) by mouth daily. 60 tablet 2   metFORMIN (GLUCOPHAGE) 500 MG tablet Take 500 mg by mouth 2 (two) times daily with a meal.      montelukast (SINGULAIR) 10 MG tablet Take 10 mg by mouth at bedtime.      Multiple Vitamin (MULTIVITAMIN) capsule Take 1 capsule by mouth daily.       oxybutynin (DITROPAN-XL) 10 MG 24 hr tablet Take 10 mg by mouth daily.     pantoprazole (PROTONIX) 40 MG tablet Take 1 tablet (40 mg total) by mouth daily. 60 tablet 2   Potassium Chloride CR (MICRO-K) 8 MEQ CPCR capsule CR TAKE 1 CAPSULE BY MOUTH ONCE DAILY FOR LOW POTASSIUM     simvastatin (ZOCOR) 20 MG tablet Take 20 mg by mouth at bedtime.     spironolactone (ALDACTONE) 25 MG tablet Take 1 tablet (25 mg total) by mouth daily. 60 tablet 2   traMADol (ULTRAM) 50 MG tablet Take 50 mg by mouth 2 (two) times daily as needed for pain.     VICTOZA 18 MG/3ML SOPN 12 mg daily.      BD PEN NEEDLE NANO U/F 32G X 4 MM MISC      isosorbide mononitrate (IMDUR) 120 MG 24 hr tablet Take 1 tablet (120 mg total) by mouth daily. (Patient not taking: Reported on 06/10/2019) 60 tablet 2   nitroGLYCERIN  (NITROSTAT) 0.4 MG SL tablet DISSOLVE ONE TABLET UNDER THE TONGUE EVERY 5 MINUTES AS NEEDED FOR CHEST PAIN.  DO NOT EXCEED A TOTAL OF 3 DOSES IN 15 MINUTES (Patient not taking: Reported on 06/10/2019) 25 tablet 5   No current facility-administered medications for this visit.    Past Surgical History:  Procedure Laterality Date   ABDOMINAL HYSTERECTOMY     ABDOMINAL HYSTERECTOMY     BIOPSY  03/22/2021   Procedure: BIOPSY;  Surgeon: Rush Landmark Telford Nab., MD;  Location: [facility];  Service: Gastroenterology;;   BREAST SURGERY     begin mass,left  breast   COLONOSCOPY  08/17/2015   Colonic polyp status post polypectomy. Mild pancolonic diverticulosis. Highly redundant colon.    DILATION AND CURETTAGE OF UTERUS     one   ENDOSCOPIC MUCOSAL RESECTION N/A 03/22/2021   Procedure: ENDOSCOPIC MUCOSAL RESECTION;  Surgeon: Rush Landmark Telford Nab., MD;  Location: [facility];  Service: Gastroenterology;  Laterality: N/A;   ESOPHAGOGASTRODUODENOSCOPY  08/17/2015   Moderate hiatal hernia. Otherwise noraml EGD.    ESOPHAGOGASTRODUODENOSCOPY  09/03/2019   [place]   ESOPHAGOGASTRODUODENOSCOPY (EGD) WITH PROPOFOL N/A 03/22/2021   Procedure: ESOPHAGOGASTRODUODENOSCOPY (EGD) WITH PROPOFOL;  Surgeon: Rush Landmark Telford Nab., MD;  Location: [facility];  Service: Gastroenterology;  Laterality: N/A;   EUS  05/19/2011   Procedure: UPPER ENDOSCOPIC ULTRASOUND (EUS) LINEAR;  Surgeon: Owens Loffler, MD;  Location: [facility];  Service: Endoscopy;  Laterality: N/A;  radial linear    HEMOSTASIS CLIP PLACEMENT  03/22/2021   Procedure: HEMOSTASIS CLIP PLACEMENT;  Surgeon: Irving Copas., MD;  Location: [facility];  Service: Gastroenterology;;   HERNIA REPAIR  05/09/2020   HYSTEROTOMY     KNEE SURGERY     LEFT HEART CATH AND CORONARY ANGIOGRAPHY     LEFT HEART CATH AND CORONARY ANGIOGRAPHY N/A 05/08/2019   Procedure: LEFT HEART CATH AND CORONARY ANGIOGRAPHY;  Surgeon: Burnell Blanks, MD;  Location: [place] [facility];  Service: Cardiovascular;  Laterality: N/A;   right  thumb     tendon repaire   RIGHT/LEFT HEART CATH AND CORONARY ANGIOGRAPHY N/A 06/23/2020   Procedure: RIGHT/LEFT HEART CATH AND CORONARY ANGIOGRAPHY;  Surgeon: Martinique, Peter M, MD;  Location: [place] [facility];  Service: Cardiovascular;  Laterality: N/A;   SHOULDER SURGERY     rotator cuff repair   SUBMUCOSAL LIFTING INJECTION  03/22/2021   Procedure: SUBMUCOSAL LIFTING INJECTION;  Surgeon: Irving Copas., MD;  Location: [facility];  Service: Gastroenterology;;   TRANSFORAMINAL LUMBAR INTERBODY FUSION L4-5   02/27/2020   WRIST SURGERY     rigth    Family History  Problem Relation Age of Onset   Diabetes Sister    Cancer Sister    Hypertension Daughter    Colon cancer Neg Hx    Liver disease Neg Hx    Pancreatic cancer Neg Hx    Esophageal cancer Neg Hx     Social History   Tobacco Use   Smoking status: Former    Packs/day: 1.00    Types: Cigarettes    Quit date: 05/17/2011    Years since quitting: 11.1   Smokeless tobacco: Never  Vaping Use   Vaping Use: Never used  Substance Use Topics   Alcohol use: No   Drug use: No    Allergies  Allergen Reactions   Penicillins Rash    Reaction was 30years ago  Has never taken again Reaction was 30years ago  Has never taken again Has patient had a PCN reaction causing anaphylaxis, immediate rash, facial/tongue/throat swelling, SOB or lightheadedness with hypotension? no Has patient had a PCN reaction causing severe rash involving mucus membranes or skin necrosis?  no Has patient had a PCN reaction that required hospitilization?no  Has patient had a PCN reaction occurring within the last 10 years?  no If all of the above answers are "no" then may proceed with cephalosporin use   Ampicillin Hives   Tramadol Other (See Comments)    Hallunications       Review of Systems: All systems reviewed and negative except  where noted in HPI.    Physical Exam:     BP 122/78   Pulse 65   Ht 5' 5.5" (1.664 m)   Wt 173 lb (78.5 kg)   BMI 28.35 kg/m   Gen: awake, alert, NAD HEENT: anicteric, no pallor CV: RRR, no mrg Pulm: CTA b/l Abd: soft, NT/ND, +BS throughout Ext: no c/c/e Neuro: nonfocal     Latest Ref Rng & Units 06/23/2022   12:00 AM 12/21/2021   12:00 AM 09/13/2021   12:00 AM  CBC  WBC  7.4     4.7     4.6      Hemoglobin 12.0 - 16.0 8.8     11.5     8.4      Hematocrit 36 - 46 28     36     28      Platelets 150 - 400 K/uL 370     269     392         This result is from an external source.      Latest Ref Rng & Units 12/21/2021   12:00 AM 09/13/2021   12:00 AM 05/14/2021   12:00 AM  CMP  BUN 4 - '21 12     12     17   '$ Creatinine 0.5 - 1.1 1.2     1.2     1.2   Sodium 137 -  147 139     141     139   Potassium 3.5 - 5.1 mEq/L 3.1     3.8     3.0   Chloride 99 - 108 103     105     104   CO2 13 - '22 28     30     26   '$ Calcium 8.7 - 10.7 9.5     8.9     9.2   Alkaline Phos 25 - 125 93     82     104   AST 13 - 35 35     32     34   ALT 7 - 35 U/L 37     33     37      This result is from an external source.       Ismaeel Arvelo,MD 07/15/2022, 9:59 AM

## 2022-07-17 DIAGNOSIS — L251 Unspecified contact dermatitis due to drugs in contact with skin: Secondary | ICD-10-CM | POA: Diagnosis not present

## 2022-07-17 DIAGNOSIS — S02841A Fracture of lateral orbital wall, right side, initial encounter for closed fracture: Secondary | ICD-10-CM | POA: Diagnosis not present

## 2022-07-17 DIAGNOSIS — S0285XA Fracture of orbit, unspecified, initial encounter for closed fracture: Secondary | ICD-10-CM | POA: Diagnosis not present

## 2022-07-17 DIAGNOSIS — M47812 Spondylosis without myelopathy or radiculopathy, cervical region: Secondary | ICD-10-CM | POA: Diagnosis not present

## 2022-07-17 DIAGNOSIS — I10 Essential (primary) hypertension: Secondary | ICD-10-CM | POA: Diagnosis not present

## 2022-07-17 DIAGNOSIS — Z043 Encounter for examination and observation following other accident: Secondary | ICD-10-CM | POA: Diagnosis not present

## 2022-07-18 ENCOUNTER — Telehealth: Payer: Self-pay

## 2022-07-18 DIAGNOSIS — R140 Abdominal distension (gaseous): Secondary | ICD-10-CM

## 2022-07-18 DIAGNOSIS — K449 Diaphragmatic hernia without obstruction or gangrene: Secondary | ICD-10-CM

## 2022-07-18 DIAGNOSIS — C451 Mesothelioma of peritoneum: Secondary | ICD-10-CM

## 2022-07-18 DIAGNOSIS — R141 Gas pain: Secondary | ICD-10-CM

## 2022-07-18 DIAGNOSIS — R195 Other fecal abnormalities: Secondary | ICD-10-CM

## 2022-07-18 DIAGNOSIS — D509 Iron deficiency anemia, unspecified: Secondary | ICD-10-CM

## 2022-07-18 NOTE — Telephone Encounter (Signed)
Spoke with patient and told her that my earliest date is 6-24 and she said she will take it. I told her I  would call her later on this week regarding her instructions for the procedure.  Amb ref placed and scheduled for 6-24 at [facility] at 845am

## 2022-07-19 ENCOUNTER — Telehealth: Payer: Self-pay

## 2022-07-19 NOTE — Telephone Encounter (Signed)
   Patient Name: Deborah Hutchinson  DOB: 17-Feb-1946 MRN: [id number]  Primary Cardiologist: [name], MD  Chart reviewed as part of pre-operative protocol coverage.  Patient is cleared to hold Plavix 5 days prior to her endoscopy procedure and should restart postprocedure when hemostasis is achieved.   I will route this recommendation to the requesting party via Epic fax function and remove from pre-op pool.  Please call with questions.  [name], Marissa Nestle, NP 07/19/2022, 2:48 PM

## 2022-07-19 NOTE — Telephone Encounter (Signed)
[facility] Pre-operative Risk Assessment     Request for surgical clearance:     Endoscopy Procedure  What type of surgery is being performed?     Push Enteroscopy  When is this surgery scheduled?     10-31-2022  What type of clearance is required ?   Pharmacy  Are there any medications that need to be held prior to surgery and how long? Plavix 5 day hold  Practice name and name of physician performing surgery?      [facility]  What is your office phone and fax number?      Phone- [phone]  [phone]  Anesthesia type (None, local, MAC, general) ?       MAC

## 2022-07-19 NOTE — Telephone Encounter (Signed)
Cardiac clearance sent as well to pre op pool

## 2022-07-20 DIAGNOSIS — Z09 Encounter for follow-up examination after completed treatment for conditions other than malignant neoplasm: Secondary | ICD-10-CM | POA: Diagnosis not present

## 2022-07-20 DIAGNOSIS — Z8781 Personal history of (healed) traumatic fracture: Secondary | ICD-10-CM | POA: Diagnosis not present

## 2022-07-20 DIAGNOSIS — R197 Diarrhea, unspecified: Secondary | ICD-10-CM | POA: Diagnosis not present

## 2022-07-20 NOTE — Telephone Encounter (Signed)
Moved case to 5-30 945am by shay and patient was agreeable to this. New amb ref placed.  Instructions made and will call patient to go over instructions

## 2022-07-21 NOTE — Telephone Encounter (Signed)
Noted  

## 2022-07-21 NOTE — Telephone Encounter (Signed)
Mailed instructions and spoke to patient. Will call in 2 weeks to go over them. She said she hasn't looked at them yet at [facility]

## 2022-07-25 ENCOUNTER — Telehealth: Payer: Self-pay

## 2022-07-25 ENCOUNTER — Telehealth: Payer: Self-pay | Admitting: Gastroenterology

## 2022-07-25 NOTE — Telephone Encounter (Signed)
     Patient  visit on 3/10  at [facility]   Have you been able to follow up with your primary care physician? Yes   The patient was or was not able to obtain any needed medicine or equipment. Yes   Are there diet recommendations that you are having difficulty following? Na   Patient expresses understanding of discharge instructions and education provided has no other needs at this time.  Yes      Dunn Loring [phone] [address], [place], [place] [address] Phone: [phone] Email: [email or web address]

## 2022-07-25 NOTE — Telephone Encounter (Signed)
Inbound call from patient, states she is having issues with incontinence. Patient states she is unable to hold her bowel movements and would like to speak with a nurse in regards. Please advise.

## 2022-07-25 NOTE — Telephone Encounter (Signed)
Pt stated that she is having lots of episodes of incontinence of stool during the day and also while she is asleep: Pt was rescheduled for an sooner office visit on 08/24/2022 at 8:50 AM.  Pt made aware. Pt verbalized understanding with all questions answered.

## 2022-07-26 DIAGNOSIS — R159 Full incontinence of feces: Secondary | ICD-10-CM | POA: Diagnosis not present

## 2022-07-26 DIAGNOSIS — C451 Mesothelioma of peritoneum: Secondary | ICD-10-CM | POA: Diagnosis not present

## 2022-07-26 DIAGNOSIS — Z7902 Long term (current) use of antithrombotics/antiplatelets: Secondary | ICD-10-CM | POA: Diagnosis not present

## 2022-07-26 DIAGNOSIS — Z09 Encounter for follow-up examination after completed treatment for conditions other than malignant neoplasm: Secondary | ICD-10-CM | POA: Diagnosis not present

## 2022-07-26 DIAGNOSIS — Z7984 Long term (current) use of oral hypoglycemic drugs: Secondary | ICD-10-CM | POA: Diagnosis not present

## 2022-07-26 DIAGNOSIS — Z79899 Other long term (current) drug therapy: Secondary | ICD-10-CM | POA: Diagnosis not present

## 2022-07-26 DIAGNOSIS — K5730 Diverticulosis of large intestine without perforation or abscess without bleeding: Secondary | ICD-10-CM | POA: Diagnosis not present

## 2022-07-26 DIAGNOSIS — Z86718 Personal history of other venous thrombosis and embolism: Secondary | ICD-10-CM | POA: Diagnosis not present

## 2022-07-26 DIAGNOSIS — R918 Other nonspecific abnormal finding of lung field: Secondary | ICD-10-CM | POA: Diagnosis not present

## 2022-07-27 DIAGNOSIS — J301 Allergic rhinitis due to pollen: Secondary | ICD-10-CM | POA: Diagnosis not present

## 2022-07-29 ENCOUNTER — Encounter: Payer: Self-pay | Admitting: Neurology

## 2022-07-29 ENCOUNTER — Ambulatory Visit (INDEPENDENT_AMBULATORY_CARE_PROVIDER_SITE_OTHER): Payer: Medicare HMO | Admitting: Neurology

## 2022-07-29 VITALS — BP 161/77 | HR 68 | Ht 65.0 in | Wt 174.6 lb

## 2022-07-29 DIAGNOSIS — G44309 Post-traumatic headache, unspecified, not intractable: Secondary | ICD-10-CM | POA: Diagnosis not present

## 2022-07-29 NOTE — Progress Notes (Signed)
NEUROLOGY FOLLOW UP OFFICE NOTE  Ronita Behner [id number]  Assessment/Plan:   1  Post-traumatic headache.  I do not appreciate concussion on exam.  Her double vision is likely related to eye trauma, which has steadily been improving.  Headaches are likely going to improve with ongoing healing of her eye and facial pain.   2  Hypertension - did not take her medication this morning.  At this time, she would not like to start a medication for headaches.   Limit use of pain relievers to no more than 2 days out of week to prevent risk of rebound or medication-overuse headache. Follow up with ophthalmology and ENT Follow up with me in 5 months. Follow up with PCP if blood pressure does not normalize after taking her medication  Total time spent face to face with patient and reviewing notes:  30 minutes  Subjective:  BRITANEY DENYS is a 77 year old female with iron-deficiency anemia, DM II, CAD, mesothelioma, orthostatic hypotension, and HTN who follows up for recent head injury.  She is accompanied by her daughter who supplements history.  UPDATE: Last seen in June for tremor.  Plan was to monitor.    On 3/6, she fell striking the right side of her face and sustained a right orbital fracture.  She went to [facility] and had a head scan (probably CT scan) which did not reveal an acute intracranial abnormality.  She has had a headache ever since.  It is a persistent right frontal throbbing headache.  When she wakes up in the morning, she has horizontal double vision for 20 minutes.  It hurts moving her right eye up and down.  Overall that has been improving.  However, headaches are getting more severe.  No nausea, vomiting, dizziness, photophobia.  She has been taking oxycodone and naproxen daily.  She finished the oxycodone but continues on the naproxen.    HISTORY: She started noticing tremors in the hands in early 2023.  When she is not paying attention, her hands shake, right  worse than left.  It doesn't occur with action, only at rest.  No changes in gait, balance, vision, swallowing.  Her daughter thinks it is getting worse. No new medications started just prior to onset of symptoms.  No know family history of tremor.    Other history: She began having headaches in 2017-2018.  They only occur when she has episodes of dyspnea.  She suddenly feels short of breath and lightheaded but doesn't pass out.  It is not exertional and happens at rest.  It does not occur out of sleep.  She has asthma and chronic bronchitis.  When these episodes occur, she develops 10/10 right sided stabbing headache.  She has associated weakness but denies associated neck pain, nausea, vomiting, photophobia, phonophobia, visual disturbance or unilateral numbness or weakness.  It typically lasts 5 minutes, about as long as the shortness of breath until she uses her inhaler.  Frequency varies.  She may have a week without episodes or twice a week or some weeks it may occur several times.  She also has longstanding history of tinnitus, which she describes as sounding like "bees" in her head.  She was evaluated by ENT in April 2019.  Hearing test and tympanogram were unremarkable.  When she gets these headaches, the ringing sounds louder.  She denies prior history of headaches.  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  No aneurysm.  Due to the severe right cavernous carotid stenosis,, ASA was switched to Plavix, which was first discussed with her cardiologist, Dr. Geraldo Pitter.   Past medications:  pregablin (hallucinations)  PAST MEDICAL HISTORY: Past Medical History:  Diagnosis Date   Aftercare following surgery 03/26/2020   Allergy    Angina pectoris ([place]) 06/17/2020   Anxiety    Arthritis    Asthma    Bronchitis    CAD (coronary artery disease) 02/13/2019    Chest pain 05/08/2019   Chest pain of uncertain etiology    Chronic bilateral low back pain without sciatica 07/11/2019   Formatting of this note might be different from the original. Added automatically from request for surgery [id number]   Chronic obstructive pulmonary disease ([place]) 02/09/2017   Formatting of this note might be different from the original. Last Assessment & Plan:  I will review her PFTs after I obtain records from her pulmonologist. Based on this we will decide if she needs inhalers, subjectively they do not seem to have helped her at all. She may benefit from a pulmonary rehabilitation program Formatting of this note might be different from the original. Last Assessment    COPD (chronic obstructive pulmonary disease) ([place]) 02/09/2017   Coronary artery disease involving native coronary artery of native heart without angina pectoris 06/10/2015   Diabetes mellitus due to underlying condition with unspecified complications ([place]) 99991111   Dyslipidemia 06/10/2015   Esophageal dysphagia 09/02/2019   Formatting of this note might be different from the original. 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 Added automatically from request for surgery [id number]   Peritoneal mesothelioma ([place]) 01/16/2020   S/P lumbar fusion 03/26/2020   Shortness of breath     on  excertion   Sleep apnea     MEDICATIONS: Current Outpatient Medications on File Prior to Visit  Medication Sig Dispense Refill   albuterol (PROVENTIL HFA;VENTOLIN HFA) 108 (90 Base) MCG/ACT inhaler Inhale 2 puffs into the lungs every 6 (six) hours as needed for wheezing or shortness of breath. 3 Inhaler 3   azelastine (ASTELIN) 0.1 % nasal spray Use two sprays in each nostril twice daily as directed for nasal drainage. 30 mL 5   BREZTRI AEROSPHERE 160-9-4.8 MCG/ACT AERO Inhale 1-2 puffs into the lungs daily.     buPROPion (WELLBUTRIN XL) 300 MG 24 hr tablet Take 300 mg by mouth daily.      canagliflozin (INVOKANA) 300 MG TABS tablet Take 300 mg by mouth daily.     celecoxib (CELEBREX) 100 MG capsule Take 100 mg by mouth 2 (two) times daily.     Cholecalciferol (VITAMIN D3) 1.25 MG (50000 UT) CAPS Take 50,000 Units by mouth every 30 (thirty) days.     clopidogrel (PLAVIX) 75 MG tablet Take 1  tablet (75 mg total) by mouth daily. 90 tablet 2   diclofenac Sodium (VOLTAREN) 1 % GEL Apply 1 g topically as needed (Joint pain).     donepezil (ARICEPT) 10 MG tablet Take 10 mg by mouth daily at 12 noon.     EPINEPHrine 0.3 mg/0.3 mL IJ SOAJ injection Inject 0.3 mg into the muscle as needed for anaphylaxis.     escitalopram (LEXAPRO) 20 MG tablet Take 20 mg by mouth daily.     famotidine (PEPCID) 40 MG tablet Take 1 tablet (40 mg total) by mouth 2 (two) times daily. 60 tablet 11   Fluticasone-Umeclidin-Vilant (TRELEGY ELLIPTA) 200-62.5-25 MCG/INH AEPB Inhale 1 puff into the lungs daily.     furosemide (LASIX) 20 MG tablet Take 20 mg by mouth daily.     isosorbide mononitrate (IMDUR) 120 MG 24 hr tablet Take 1 tablet by mouth once daily 60 tablet 6   levocetirizine (XYZAL) 5 MG tablet TAKE 1 TABLET BY MOUTH ONCE DAILY IN THE EVENING 30 tablet 0   lidocaine (LIDODERM) 5 % Place 1 patch onto the skin daily.     LINZESS 72 MCG capsule TAKE 1 CAPSULE BEFORE BREAKFAST 90 capsule 1   liraglutide  (VICTOZA) 18 MG/3ML SOPN Inject 1.8 mg into the skin daily.     losartan-hydrochlorothiazide (HYZAAR) 100-25 MG tablet Take 1 tablet by mouth daily.     metoCLOPramide (REGLAN) 10 MG tablet Take 10 mg by mouth 4 (four) times daily as needed for nausea.     montelukast (SINGULAIR) 10 MG tablet Take 10 mg by mouth at bedtime.      Multiple Vitamin (MULTIVITAMIN) capsule Take 1 capsule by mouth daily.     nitroGLYCERIN (NITROSTAT) 0.4 MG SL tablet Place 0.4 mg under the tongue every 5 (five) minutes as needed for chest pain.     pantoprazole (PROTONIX) 40 MG tablet Take 1 tablet (40 mg total) by mouth 2 (two) times daily before a meal. 180 tablet 5   Potassium Chloride CR (MICRO-K) 8 MEQ CPCR capsule CR Take 1 capsule (8 mEq total) by mouth daily. 90 capsule 1   pregabalin (LYRICA) 50 MG capsule Take 50 mg by mouth daily.     rosuvastatin (CRESTOR) 20 MG tablet Take 20 mg by mouth daily.     traZODone (DESYREL) 50 MG tablet Take 50 mg by mouth at bedtime.     No current facility-administered medications on file prior to visit.    ALLERGIES: Allergies  Allergen Reactions   Penicillins Rash    Reaction was 30years ago  Has never taken again Reaction was 30years ago  Has never taken again Has patient had a PCN reaction causing anaphylaxis, immediate rash, facial/tongue/throat swelling, SOB or lightheadedness with hypotension? no Has patient had a PCN reaction causing severe rash involving mucus membranes or skin necrosis?  no Has patient had a PCN reaction that required hospitilization?no  Has patient had a PCN reaction occurring within the last 10 years?  no If all of the above answers are "no" then may proceed with cephalosporin use   Ampicillin Hives   Tramadol Other (See Comments)    Hallunications      FAMILY HISTORY: Family History  Problem Relation Age of Onset   Diabetes Sister    Cancer Sister    Hypertension Daughter    Colon cancer Neg Hx    Liver disease Neg Hx     Pancreatic cancer Neg Hx    Esophageal cancer Neg Hx  Objective:  Blood pressure (!) 161/77, pulse 68, height 5\' 5"  (1.651 m), weight 174 lb 9.6 oz (79.2 kg), SpO2 99 %. General: No acute distress.  Patient appears well-groomed.   Head:  Normocephalic/atraumatic Eyes:  Fundi examined but not visualized Neck: supple, no paraspinal tenderness, full range of motion Heart:  Regular rate and rhythm Neurological Exam: alert and oriented to person, place, and time.  Speech fluent and not dysarthric, language intact.  Slight reduced right V1 sensation.  Otherwise, CN II-XII intact. Bulk and tone normal, muscle strength 5/5 throughout.  Sensation to light touch intact.  Deep tendon reflexes 2+ throughout.  Finger to nose testing intact.  Gait steady, Romberg negative.    Metta Clines, DO  CC: [name], MD

## 2022-07-29 NOTE — Telephone Encounter (Signed)
Regarding SIBO testing the company [employer] has been in touch with the patient because she owes the 99 dollars. They have sent a patient assistance program to her and they can't release the results until the money is resolved.

## 2022-08-07 DIAGNOSIS — I2510 Atherosclerotic heart disease of native coronary artery without angina pectoris: Secondary | ICD-10-CM | POA: Diagnosis not present

## 2022-08-07 DIAGNOSIS — I10 Essential (primary) hypertension: Secondary | ICD-10-CM | POA: Diagnosis not present

## 2022-08-07 DIAGNOSIS — E785 Hyperlipidemia, unspecified: Secondary | ICD-10-CM | POA: Diagnosis not present

## 2022-08-08 DIAGNOSIS — M5416 Radiculopathy, lumbar region: Secondary | ICD-10-CM

## 2022-08-08 DIAGNOSIS — M961 Postlaminectomy syndrome, not elsewhere classified: Secondary | ICD-10-CM | POA: Diagnosis not present

## 2022-08-08 HISTORY — DX: Radiculopathy, lumbar region: M54.16

## 2022-08-09 ENCOUNTER — Telehealth: Payer: Self-pay | Admitting: Cardiology

## 2022-08-09 NOTE — Telephone Encounter (Signed)
   Patient Name: Deborah Hutchinson  DOB: 1945/10/06 MRN: [id number]  Primary Cardiologist: [name], MD  Chart reviewed as part of pre-operative protocol coverage.   Patient is cleared to hold Plavix 7 days prior to her scheduled procedure with plan to resume postprocedure when safest and hemostasis is achieved.   I will route this recommendation to the requesting party via Epic fax function and remove from pre-op pool.  Please call with questions.  [name], Marissa Nestle, NP 08/09/2022, 3:13 PM

## 2022-08-09 NOTE — Telephone Encounter (Signed)
   Pre-operative Risk Assessment    Patient Name: Deborah Hutchinson  DOB: [birth year] MRN: [id number]      Request for Surgical Clearance    Procedure:   Lumbar Epidural Steroid Injection   Date of Surgery:  Clearance 08/30/22                                 Surgeon:  Dr. Darcel Bayley  Surgeon's Group or Practice Name:  [facility]  Phone number:  [phone] Fax number:  [phone]   Type of Clearance Requested:   - Pharmacy:  Hold Clopidogrel (Plavix) 7 day hold    Type of Anesthesia:  None    Additional requests/questions:    Signed, April Henson   08/09/2022, 2:19 PM

## 2022-08-19 DIAGNOSIS — I10 Essential (primary) hypertension: Secondary | ICD-10-CM | POA: Diagnosis not present

## 2022-08-19 DIAGNOSIS — I2510 Atherosclerotic heart disease of native coronary artery without angina pectoris: Secondary | ICD-10-CM | POA: Diagnosis not present

## 2022-08-19 DIAGNOSIS — M25473 Effusion, unspecified ankle: Secondary | ICD-10-CM | POA: Diagnosis not present

## 2022-08-19 DIAGNOSIS — K219 Gastro-esophageal reflux disease without esophagitis: Secondary | ICD-10-CM | POA: Diagnosis not present

## 2022-08-19 DIAGNOSIS — E785 Hyperlipidemia, unspecified: Secondary | ICD-10-CM | POA: Diagnosis not present

## 2022-08-19 DIAGNOSIS — R413 Other amnesia: Secondary | ICD-10-CM | POA: Diagnosis not present

## 2022-08-19 DIAGNOSIS — M159 Polyosteoarthritis, unspecified: Secondary | ICD-10-CM | POA: Diagnosis not present

## 2022-08-19 DIAGNOSIS — C451 Mesothelioma of peritoneum: Secondary | ICD-10-CM | POA: Diagnosis not present

## 2022-08-19 DIAGNOSIS — E1140 Type 2 diabetes mellitus with diabetic neuropathy, unspecified: Secondary | ICD-10-CM | POA: Diagnosis not present

## 2022-08-23 ENCOUNTER — Ambulatory Visit: Payer: Medicare HMO | Admitting: Neurology

## 2022-08-24 ENCOUNTER — Ambulatory Visit (INDEPENDENT_AMBULATORY_CARE_PROVIDER_SITE_OTHER): Payer: Medicare HMO | Admitting: Gastroenterology

## 2022-08-24 ENCOUNTER — Telehealth: Payer: Self-pay

## 2022-08-24 ENCOUNTER — Encounter: Payer: Self-pay | Admitting: Gastroenterology

## 2022-08-24 ENCOUNTER — Other Ambulatory Visit (INDEPENDENT_AMBULATORY_CARE_PROVIDER_SITE_OTHER): Payer: Medicare HMO

## 2022-08-24 VITALS — BP 138/70 | HR 71 | Ht 65.5 in | Wt 174.0 lb

## 2022-08-24 DIAGNOSIS — D509 Iron deficiency anemia, unspecified: Secondary | ICD-10-CM

## 2022-08-24 DIAGNOSIS — R195 Other fecal abnormalities: Secondary | ICD-10-CM | POA: Diagnosis not present

## 2022-08-24 DIAGNOSIS — J301 Allergic rhinitis due to pollen: Secondary | ICD-10-CM | POA: Diagnosis not present

## 2022-08-24 LAB — CBC WITH DIFFERENTIAL/PLATELET
Basophils Absolute: 0 10*3/uL (ref 0.0–0.1)
Basophils Relative: 0.9 % (ref 0.0–3.0)
Eosinophils Absolute: 0.3 10*3/uL (ref 0.0–0.7)
Eosinophils Relative: 5.2 % — ABNORMAL HIGH (ref 0.0–5.0)
HCT: 35.1 % — ABNORMAL LOW (ref 36.0–46.0)
Hemoglobin: 11.2 g/dL — ABNORMAL LOW (ref 12.0–15.0)
Lymphocytes Relative: 23.4 % (ref 12.0–46.0)
Lymphs Abs: 1.2 10*3/uL (ref 0.7–4.0)
MCHC: 32 g/dL (ref 30.0–36.0)
MCV: 88.4 fl (ref 78.0–100.0)
Monocytes Absolute: 0.6 10*3/uL (ref 0.1–1.0)
Monocytes Relative: 11.1 % (ref 3.0–12.0)
Neutro Abs: 3 10*3/uL (ref 1.4–7.7)
Neutrophils Relative %: 59.4 % (ref 43.0–77.0)
Platelets: 327 10*3/uL (ref 150.0–400.0)
RBC: 3.97 Mil/uL (ref 3.87–5.11)
RDW: 22.2 % — ABNORMAL HIGH (ref 11.5–15.5)
WBC: 5 10*3/uL (ref 4.0–10.5)

## 2022-08-24 LAB — COMPREHENSIVE METABOLIC PANEL
ALT: 34 U/L (ref 0–35)
AST: 24 U/L (ref 0–37)
Albumin: 4 g/dL (ref 3.5–5.2)
Alkaline Phosphatase: 96 U/L (ref 39–117)
BUN: 15 mg/dL (ref 6–23)
CO2: 30 mEq/L (ref 19–32)
Calcium: 9.5 mg/dL (ref 8.4–10.5)
Chloride: 103 mEq/L (ref 96–112)
Creatinine, Ser: 1.1 mg/dL (ref 0.40–1.20)
GFR: 47.92 mL/min — ABNORMAL LOW (ref >60.00)
Glucose, Bld: 89 mg/dL (ref 70–99)
Potassium: 3.8 mEq/L (ref 3.5–5.1)
Sodium: 141 mEq/L (ref 135–145)
Total Bilirubin: 0.2 mg/dL (ref 0.2–1.2)
Total Protein: 6.8 g/dL (ref 6.0–8.3)

## 2022-08-24 NOTE — Progress Notes (Signed)
IMPRESSION and PLAN:    #1.  Bloating/Eructation/flatulence: Likely due to aerophagia, likely functional.    #2. Gastroparesis. Reglan stopped d/t tardive dyskinesia.  #3. Recurrent symptomatic IDA with H+ stools. S/P multiple iron infusoins.  Followed by hematology. Neg PET 06/2022. CT chest Abdo/pelvis March 2023 shows peritoneal mesothelioma to be quiescent.  Majority of the hyperplastic gastric polyps were removed on EGD 03/2021 with Dr. Meridee Score.  She had neg colonoscopy 12/2018. VCE 09/2021 was incomplete d/t gastric retention.  #2. GERD with large HH s/p repair Nov 2021 with small residual/recurrent hernia on EGD with reflux esophagitis.  #3. Peritoneal mesothelioma (low grade) at time of [facility] repair. Stable. Last CT chest/A/P March 2023 without progression. Being followed by Dr Lenis Noon. Neg PET 06/2022    Plan: -Continue Protonix  po QD -Pepcid 20 mg p.o. QHS -Continue IV iron as per hematology -CBC, CMP. -Enteroscopy at [facility] with VCE capsule drop, off plavix x 7 days. She has been cleared by cardiology -RTC 12 weeks. -D/W pt and daughter. -If any active bleeding, they will promptly get in touch with [place]. HPI:    Chief Complaint:   Deborah Hutchinson is a 77 y.o. female with CAD on plavix, DM2, HH, OSA, COPD, LBP d/t OA, HTN, HLD.  With recurrent symptomatic IDA despite IV iron  Hb 8.8, MCV 77 with ferritin 4, saturation 6% 06/23/2022 S/p another iron infusion  Most recent Hb 11 07/15/2022  Had dark stools previously.  Note he she has been on p.o. iron as well.  Most recent CT chest Abdo/pelvis March 2023 shows peritoneal mesothelioma to be quiescent.  Majority of the hyperplastic gastric polyps were removed on EGD 03/2021 with Dr. Meridee Score.  She had negative colonoscopy 12/2018 as below.  Had heartburn, postprandial abdominal bloating without nausea/vomiting.  No abdominal pain.  No diarrhea or constipation.  She denies having any weight loss.  Complains of  constant burping.  Continues to be Plavix  Also has mild renal insufficiency.  With H+ stools, likely d/t Mod sized gastric polyps (hyperplastic) in setting of Plavix for CAD. Neg colon 11/2020  No pica to ice.  No over-the-counter nonsteroidals.  Unfortunately had a fall with right face hematoma.    Previous notes: S/P HH repair by Dr. Carolynn Sayers November 2021. Was found to have low-grade mesothelioma.  She is closely being followed by Dr. Lenis Noon.  No worsening. CT Abdo/pelvis March 2022, 07/2021 as below which did not show any progression.     Past GI procedures:  PET 06/2022 IMPRESSION:  1. No tracer avid solid nodule or mass identified.  2. As noted previously there are multiple ground-glass nodules  identified within both lungs. These do not demonstrate any  appreciable tracer uptake. The largest ground-glass nodule is in the  periphery of the left upper lobe measuring 1.3 cm. Absent FDG uptake  does not exclude the possibility of underlying indolent neoplastic  process such as pulmonary adenocarcinoma and continued interval  surveillance of these nodules is strongly recommended.  3. Stable left adrenal adenoma.  4. Coronary artery calcifications.  5.  Aortic Atherosclerosis (ICD10-I70.0).   CT chest/AP 07/2021 1.  No significant change in appearance of thickening of the diaphragm status post paraesophageal hernia repair. No definite new peritoneal disease or diaphragmatic nodularity identified.  2.  Similar groundglass opacities/nodules throughout the lungs bilaterally.  3.  Similar bilateral adrenal nodules. Can consider MR imaging for further characterization as clinically indicated.  EGD 03/2021 (Dr Meridee Score) - No  gross lesions in esophagus proximally. LA Grade A esophagitis with no bleeding distally (this is improved from prior). - Z-line irregular, 34 cm from the incisors. - 3 cm hiatal hernia. - Four gastric polyps. Resected and retrieved. Clips (MR conditional)  were placed. - Gastritis. Biopsied. - No gross lesions in the duodenal bulb, in the first portion of the duodenum and in the second portion of the duodenum.   EGD 11/27/2020 - LA Grade C reflux esophagitis with no bleeding. Bx- neg - 3 cm hiatal hernia. S/P fundoplication. - 3 10-12 mm semi-sessile polyps with no bleeding and no stigmata of recent bleeding were found in the gastric body (1 at the diaphragmatic hiatus and the other 2 in proximal body of the stomach). Bx- Hyperplastic - Normal examined duodenum.  Colon 11/27/2020 - Seven 6 to 8 mm polyps in the mid descending colon, in the mid transverse colon, in the proximal ascending colon and in the mid ascending colon, removed with a cold snare. Resected and retrieved. Bx- TAs - Diverticulosis in the sigmoid colon and in the ascending colon.  EGD 08/2019: Candida esophagitis, no stricture, 7 cm hiatal hernia.  Treated empirically for Candida esophagitis  EGD 12/14/2018: Large HH, hyperplastic gastric polyps, eso stricture s/p dil 50 Fr. EGD 08/17/2015 mod HH, neg SB Bx for celiac.  UGI series 04/2019: HH, GERD, no strictures.  Ba tab passed without any problems.  Colonoscopy 12/14/2018-colonic polyps s/p polypectomy, mild pancolonic div. Bx- TA.  CT AP with contrast 02/09/2018: Neg, Stable left adrenal adenoma, small right inguinal hernia.  GES 08/2019: delayed gastric emptying (12% emptied at hr 1, 29% at hr 2, 47% at hr 3, 64% an hr 4).  CT AP 07/2020 Impression: 1. Mildly thickened appearance of the hemidiaphragms without a discrete measurable mass, overall similar relative to the prior CT dated 01/08/2020 in this patient with biopsy-proven peritoneal mesothelioma involving the right hemidiaphragm. No definite new peritoneal nodules are identified. 2. No ascites. 3. Similar groundglass opacities/nodules throughout the lungs bilaterally, most conspicuous in the right lower lobe. These findings may be infectious, inflammatory, or neoplastic  in etiology. 4. 4 mm solid right lower lobe pulmonary nodule, unchanged. The previously identified solid nodule in the right upper lobe is no longer seen. 5. Indeterminate bilateral adrenal lesions, measuring up to 1.7 cm on the left. Consider recharacterization with MRI is recommended. 6. Multiple additional ancillary findings, as above.   ============================  PATHOLOGY: PATHOLOGY: Final Pathologic Diagnosis  A. "Frozen peritoneal nodule":        Mesothelioma, epithelioid type, low tumor grade, trabecular pattern.       See comment.   B. "Peritoneal nodule":       Mesothelioma, epithelioid type, low tumor grade, trabecular pattern.       See comment.     LHC 05/08/2019: Prox RCA lesion is 50% stenosed. Prox LAD lesion is 40% stenosed. The left ventricular systolic function is normal. LV end diastolic pressure is normal. The left ventricular ejection fraction is greater than 65% by visual estimate. There is no mitral valve regurgitation. Past Medical History:  Diagnosis Date   Aftercare following surgery 03/26/2020   Allergy    Angina pectoris 06/17/2020   Anxiety    Arthritis    Asthma    Bronchitis    CAD (coronary artery disease) 02/13/2019   Chest pain 05/08/2019   Chest pain of uncertain etiology    Chronic bilateral low back pain without sciatica 07/11/2019   Formatting of this note might  be different from the original. Added automatically from request for surgery [phone]   Chronic obstructive pulmonary disease 02/09/2017   Formatting of this note might be different from the original. Last Assessment & Plan:  I will review her PFTs after I obtain records from her pulmonologist. Based on this we will decide if she needs inhalers, subjectively they do not seem to have helped her at all. She may benefit from a pulmonary rehabilitation program Formatting of this note might be different from the original. Last Assessment    COPD (chronic obstructive pulmonary  disease) 02/09/2017   Coronary artery disease involving native coronary artery of native heart without angina pectoris 06/10/2015   Diabetes mellitus due to underlying condition with unspecified complications 06/10/2015   Dyslipidemia 06/10/2015   Esophageal dysphagia 09/02/2019   Formatting of this note might be different from the original. 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 Added automatically from request for surgery [id number]   Peritoneal mesothelioma 01/16/2020   S/P lumbar fusion 03/26/2020   Shortness of breath    on  excertion   Sleep apnea     Current Outpatient Medications  Medication Sig Dispense Refill   albuterol (PROVENTIL HFA;VENTOLIN HFA) 108 (90 Base) MCG/ACT inhaler Inhale 2 puffs into the lungs every 6 (six) hours as needed for wheezing or shortness of breath. 3 Inhaler 3   amLODipine (NORVASC) 10 MG tablet Take 10 mg by mouth daily.     budesonide-formoterol (SYMBICORT) 160-4.5 MCG/ACT inhaler Inhale 2 puffs into the lungs 2 (two) times daily. 3 Inhaler 3   buPROPion (WELLBUTRIN XL) 300 MG 24 hr tablet Take 300 mg by mouth daily.      Cholecalciferol (VITAMIN D PO) Take 5,000 Units by mouth as directed. Every 2 weeks     clopidogrel (PLAVIX) 75 MG tablet Take 1 tablet (75 mg total) by mouth daily. 30 tablet 11   dicyclomine  (BENTYL) 20 MG tablet Take 20 mg by mouth every 6 (six) hours as needed.            escitalopram (LEXAPRO) 10 MG tablet Take 10 mg by mouth daily.     famotidine (PEPCID) 40 MG tablet Take 1 tablet (40 mg total) by mouth at bedtime. 180 tablet 3   fluticasone (VERAMYST) 27.5 MCG/SPRAY nasal spray Place 2 sprays into the nose daily.     gabapentin (NEURONTIN) 300 MG capsule Take 300 mg by mouth 3 (three) times daily as needed.      hydrALAZINE (APRESOLINE) 50 MG tablet Take 1 tablet (50 mg total) by mouth 3 (three) times daily. 180 tablet 2   hydrochlorothiazide (HYDRODIURIL) 25 MG tablet Take 1 tablet (25 mg total) by mouth daily. 60 tablet 2   Lancets (ACCU-CHEK MULTICLIX) lancets      linaclotide (LINZESS) 72 MCG capsule Take 72 mcg by mouth as needed.  losartan (COZAAR) 100 MG tablet Take 1 tablet (100 mg total) by mouth daily. 60 tablet 2   metFORMIN (GLUCOPHAGE) 500 MG tablet Take 500 mg by mouth 2 (two) times daily with a meal.      montelukast (SINGULAIR) 10 MG tablet Take 10 mg by mouth at bedtime.      Multiple Vitamin (MULTIVITAMIN) capsule Take 1 capsule by mouth daily.       oxybutynin (DITROPAN-XL) 10 MG 24 hr tablet Take 10 mg by mouth daily.     pantoprazole (PROTONIX) 40 MG tablet Take 1 tablet (40 mg total) by mouth daily. 60 tablet 2   Potassium Chloride CR (MICRO-K) 8 MEQ CPCR capsule CR TAKE 1 CAPSULE BY MOUTH ONCE DAILY FOR LOW POTASSIUM     simvastatin (ZOCOR) 20 MG tablet Take 20 mg by mouth at bedtime.     spironolactone (ALDACTONE) 25 MG tablet Take 1 tablet (25 mg total) by mouth daily. 60 tablet 2   traMADol (ULTRAM) 50 MG tablet Take 50 mg by mouth 2 (two) times daily as needed for pain.     VICTOZA 18 MG/3ML SOPN 12 mg daily.      BD PEN NEEDLE NANO U/F 32G X 4 MM MISC      isosorbide mononitrate (IMDUR) 120 MG 24 hr tablet Take 1 tablet (120 mg total) by mouth daily. (Patient not taking: Reported on 06/10/2019) 60 tablet 2   nitroGLYCERIN (NITROSTAT) 0.4 MG SL  tablet DISSOLVE ONE TABLET UNDER THE TONGUE EVERY 5 MINUTES AS NEEDED FOR CHEST PAIN.  DO NOT EXCEED A TOTAL OF 3 DOSES IN 15 MINUTES (Patient not taking: Reported on 06/10/2019) 25 tablet 5   No current facility-administered medications for this visit.    Past Surgical History:  Procedure Laterality Date   ABDOMINAL HYSTERECTOMY     ABDOMINAL HYSTERECTOMY     BIOPSY  03/22/2021   Procedure: BIOPSY;  Surgeon: Meridee Score Netty Starring., MD;  Location: [facility];  Service: Gastroenterology;;   BREAST SURGERY     begin mass,left  breast   COLONOSCOPY  08/17/2015   Colonic polyp status post polypectomy. Mild pancolonic diverticulosis. Highly redundant colon.    DILATION AND CURETTAGE OF UTERUS     one   ENDOSCOPIC MUCOSAL RESECTION N/A 03/22/2021   Procedure: ENDOSCOPIC MUCOSAL RESECTION;  Surgeon: Meridee Score Netty Starring., MD;  Location: [facility];  Service: Gastroenterology;  Laterality: N/A;   ESOPHAGOGASTRODUODENOSCOPY  08/17/2015   Moderate hiatal hernia. Otherwise noraml EGD.    ESOPHAGOGASTRODUODENOSCOPY  09/03/2019   [facility]   ESOPHAGOGASTRODUODENOSCOPY (EGD) WITH PROPOFOL N/A 03/22/2021   Procedure: ESOPHAGOGASTRODUODENOSCOPY (EGD) WITH PROPOFOL;  Surgeon: Meridee Score Netty Starring., MD;  Location: [facility];  Service: Gastroenterology;  Laterality: N/A;   EUS  05/19/2011   Procedure: UPPER ENDOSCOPIC ULTRASOUND (EUS) LINEAR;  Surgeon: Rob Bunting, MD;  Location: [facility];  Service: Endoscopy;  Laterality: N/A;  radial linear    HEMOSTASIS CLIP PLACEMENT  03/22/2021   Procedure: HEMOSTASIS CLIP PLACEMENT;  Surgeon: Lemar Lofty., MD;  Location: [facility];  Service: Gastroenterology;;   HERNIA REPAIR  05/09/2020   HYSTEROTOMY     KNEE SURGERY     LEFT HEART CATH AND CORONARY ANGIOGRAPHY     LEFT HEART CATH AND CORONARY ANGIOGRAPHY N/A 05/08/2019   Procedure: LEFT HEART CATH AND CORONARY ANGIOGRAPHY;  Surgeon: Kathleene Hazel, MD;   Location: [facility] INVASIVE CV LAB;  Service: Cardiovascular;  Laterality: N/A;   right thumb     tendon  repaire   RIGHT/LEFT HEART CATH AND CORONARY ANGIOGRAPHY N/A 06/23/2020   Procedure: RIGHT/LEFT HEART CATH AND CORONARY ANGIOGRAPHY;  Surgeon: Swaziland, Peter M, MD;  Location: [facility] INVASIVE CV LAB;  Service: Cardiovascular;  Laterality: N/A;   SHOULDER SURGERY     rotator cuff repair   SUBMUCOSAL LIFTING INJECTION  03/22/2021   Procedure: SUBMUCOSAL LIFTING INJECTION;  Surgeon: Lemar Lofty., MD;  Location: [facility];  Service: Gastroenterology;;   TRANSFORAMINAL LUMBAR INTERBODY FUSION L4-5   02/27/2020   WRIST SURGERY     rigth    Family History  Problem Relation Age of Onset   Diabetes Sister    Cancer Sister    Hypertension Daughter    Colon cancer Neg Hx    Liver disease Neg Hx    Pancreatic cancer Neg Hx    Esophageal cancer Neg Hx     Social History   Tobacco Use   Smoking status: Former    Packs/day: 1    Types: Cigarettes    Quit date: 05/17/2011    Years since quitting: 11.2   Smokeless tobacco: Never  Vaping Use   Vaping Use: Never used  Substance Use Topics   Alcohol use: No   Drug use: No    Allergies  Allergen Reactions   Penicillins Rash    Reaction was 30years ago  Has never taken again Reaction was 30years ago  Has never taken again Has patient had a PCN reaction causing anaphylaxis, immediate rash, facial/tongue/throat swelling, SOB or lightheadedness with hypotension? no Has patient had a PCN reaction causing severe rash involving mucus membranes or skin necrosis?  no Has patient had a PCN reaction that required hospitilization?no  Has patient had a PCN reaction occurring within the last 10 years?  no If all of the above answers are "no" then may proceed with cephalosporin use   Ampicillin Hives   Tramadol Other (See Comments)    Hallunications       Review of Systems: All systems reviewed and negative except where noted in HPI.     Physical Exam:     BP 138/70   Pulse 71   Ht 5' 5.5" (1.664 m)   Wt 174 lb (78.9 kg)   BMI 28.51 kg/m   Gen: awake, alert, NAD HEENT: anicteric, no pallor CV: RRR, no mrg Pulm: CTA b/l Abd: soft, NT/ND, +BS throughout Ext: no c/c/e Neuro: nonfocal     Latest Ref Rng & Units 07/15/2022   10:37 AM 06/23/2022   12:00 AM 12/21/2021   12:00 AM  CBC  WBC 4.0 - 10.5 K/uL 7.6  7.4     4.7      Hemoglobin 12.0 - 15.0 g/dL 62.8  8.8     31.5      Hematocrit 36.0 - 46.0 % 36.1  28     36      Platelets 150.0 - 400.0 K/uL 292.0  370     269         This result is from an external source.      Latest Ref Rng & Units 07/15/2022   10:37 AM 12/21/2021   12:00 AM 09/13/2021   12:00 AM  CMP  Glucose 70 - 99 mg/dL 92     BUN 6 - 23 mg/dL 14  12     12       Creatinine 0.40 - 1.20 mg/dL 1.76  1.2     1.2      Sodium 135 - 145  mEq/L 142  139     141      Potassium 3.5 - 5.1 mEq/L 4.0  3.1     3.8      Chloride 96 - 112 mEq/L 109  103     105      CO2 19 - 32 mEq/L 25  28     30       Calcium 8.4 - 10.5 mg/dL 9.6  9.5     8.9      Total Protein 6.0 - 8.3 g/dL 6.5     Total Bilirubin 0.2 - 1.2 mg/dL 0.3     Alkaline Phos 39 - 117 U/L 95  93     82      AST 0 - 37 U/L 23  35     32      ALT 0 - 35 U/L 32  37     33         This result is from an external source.       Heavenly Christine,MD 08/24/2022, 9:13 AM

## 2022-08-24 NOTE — Telephone Encounter (Signed)
MyChart message to patient re F/U appt

## 2022-08-24 NOTE — Patient Instructions (Signed)
You have been scheduled for an endoscopy/enteroscopy at [facility] on 10-06-22. Please follow written instructions given to you at your visit today. If you use inhalers (even only as needed), please bring them with you on the day of your procedure.  Please go to the lab in the basement of our building to have lab work done as you leave today. Hit "B" for basement when you get on the elevator.  When the doors open the lab is on your left.  We will call you with the results. Thank you.  It was a pleasure to see you today!  Thank you for trusting me with your gastrointestinal care!      If your blood pressure at your visit was 140/90 or greater, please contact your primary care physician to follow up on this.  _______________________________________________________  If you are age 42 or older, your body mass index should be between 23-30. Your Body mass index is 28.51 kg/m. If this is out of the aforementioned range listed, please consider follow up with your Primary Care Provider.  If you are age 106 or younger, your body mass index should be between 19-25. Your Body mass index is 28.51 kg/m. If this is out of the aformentioned range listed, please consider follow up with your Primary Care Provider.   ________________________________________________________  The [place] [facility] providers would like to encourage you to use [facility] to communicate with providers for non-urgent requests or questions.  Due to long hold times on the telephone, sending your provider a message by [facility] may be a faster and more efficient way to get a response.  Please allow 48 business hours for a response.  Please remember that this is for non-urgent requests.  _______________________________________________________  Due to recent changes in healthcare laws, you may see the results of your imaging and laboratory studies on MyChart before your provider has had a chance to review them.  We understand that in some  cases there may be results that are confusing or concerning to you. Not all laboratory results come back in the same time frame and the provider may be waiting for multiple results in order to interpret others.  Please give [place] 48 hours in order for your provider to thoroughly review all the results before contacting the office for clarification of your results.    Marland Kitchen

## 2022-08-25 ENCOUNTER — Telehealth: Payer: Self-pay

## 2022-08-25 NOTE — Telephone Encounter (Signed)
Pt has appt 09/12/22 with Dr. Tomie China. Pre op clearance to be addressed at appt with MD. I will update all parties involved.

## 2022-08-25 NOTE — Telephone Encounter (Signed)
Primary Cardiologist:Rajan R Revankar, MD  Chart reviewed as part of pre-operative protocol coverage. Because of Deborah Hutchinson's past medical history and time since last visit, he/she will require a follow-up visit in order to better assess preoperative cardiovascular risk.  Pre-op covering staff: - Patient already has appointment with Dr. Tomie China scheduled for 09/12/22 at which time clearance can be addressed. - Please contact requesting surgeon's office via preferred method (i.e, phone, fax) to inform them of need for appointment prior to surgery.  If applicable, this message will also be routed to pharmacy pool and/or primary cardiologist for input on holding anticoagulant/antiplatelet agent as requested below so that this information is available at time of patient's appointment.   Levi Aland, NP-C  08/25/2022, 12:29 PM [address] Office [phone] Fax [phone]

## 2022-08-25 NOTE — Telephone Encounter (Signed)
[facility] Pre-operative Risk Assessment     Request for surgical clearance:     Endoscopy Procedure  What type of surgery is being performed?     Enteroscopy  When is this surgery scheduled?     10-06-2022  What type of clearance is required ?   Pharmacy  Are there any medications that need to be held prior to surgery and how long? Plavix 7 days prior  Practice name and name of physician performing surgery?       Gastroenterology  What is your office phone and fax number?      Phone- [phone]  Fax- [phone]  Anesthesia type (None, local, MAC, general) ?       MAC

## 2022-08-30 NOTE — Telephone Encounter (Signed)
Called and LM for patient that she has a F/U appointment in July with Dr. Chales Abrahams. She wrote it down and said she will be here

## 2022-08-31 ENCOUNTER — Other Ambulatory Visit: Payer: Self-pay

## 2022-09-12 ENCOUNTER — Encounter: Payer: Self-pay | Admitting: Cardiology

## 2022-09-12 ENCOUNTER — Ambulatory Visit: Payer: Medicare HMO | Attending: Cardiology | Admitting: Cardiology

## 2022-09-12 VITALS — BP 126/64 | HR 73 | Ht 65.6 in | Wt 176.0 lb

## 2022-09-12 DIAGNOSIS — I2510 Atherosclerotic heart disease of native coronary artery without angina pectoris: Secondary | ICD-10-CM

## 2022-09-12 DIAGNOSIS — I350 Nonrheumatic aortic (valve) stenosis: Secondary | ICD-10-CM | POA: Diagnosis not present

## 2022-09-12 DIAGNOSIS — E782 Mixed hyperlipidemia: Secondary | ICD-10-CM

## 2022-09-12 DIAGNOSIS — Z01810 Encounter for preprocedural cardiovascular examination: Secondary | ICD-10-CM | POA: Insufficient documentation

## 2022-09-12 DIAGNOSIS — J301 Allergic rhinitis due to pollen: Secondary | ICD-10-CM | POA: Diagnosis not present

## 2022-09-12 DIAGNOSIS — I10 Essential (primary) hypertension: Secondary | ICD-10-CM

## 2022-09-12 HISTORY — DX: Encounter for preprocedural cardiovascular examination: Z01.810

## 2022-09-12 NOTE — Progress Notes (Signed)
Cardiology Office Note:    Date:  09/12/2022   ID:  Deborah Hutchinson, DOB [birth year], MRN [id number]  PCP:  Lucianne Lei, MD  Cardiologist:  Garwin Brothers, MD   Referring MD: Lucianne Lei, MD    ASSESSMENT:    1. Coronary artery disease involving native coronary artery of native heart without angina pectoris   2. Essential hypertension   3. Mixed dyslipidemia   4. Preop cardiovascular exam   5. Mild aortic stenosis    PLAN:    In order of problems listed above:  Coronary artery disease and preoperative cardiovascular evaluation: As mentioned below patient is planning to undergo back procedure on her spine for pain which is significant and also endoscopy.  She has had an echo and a stress test which were unremarkable in the past few months and in view of this she is not at high risk for coronary events during the aforementioned surgery.  Meticulous hemodynamic monitoring will further reduce the risk of coronary events.  She is on Plavix because of coronary artery calcification.  She can go off it as she had done in the past for a.  Of type as specified by the doctors to do her procedures.  She will go back on it as again recommended by her doctors.  I do not see any issues with this. Essential hypertension: Blood pressure stable and diet was emphasized. Diabetes mellitus and mixed dyslipidemia: Managed by primary care.  Diet emphasized. Patient will be seen in follow-up appointment in 6 months or earlier if the patient has any concerns.    Medication Adjustments/Labs and Tests Ordered: Current medicines are reviewed at length with the patient today.  Concerns regarding medicines are outlined above.  Orders Placed This Encounter  Procedures   EKG 12-Lead   No orders of the defined types were placed in this encounter.    No chief complaint on file.    History of Present Illness:    Deborah Hutchinson is a 77 y.o. female.  Patient has past medical history of coronary  artery disease, essential hypertension, mixed dyslipidemia.  She has significant back pain issues.  She is planning to undergo procedure in her back.  She also plans to undergo endoscopy.  No chest pain orthopnea or PND.  For the evaluation she leads a sedentary lifestyle.  Recent echo and stress test were unremarkable.  She has mild aortic stenosis.  Past Medical History:  Diagnosis Date   Aftercare following surgery 03/26/2020   Allergy    Angina pectoris (HCC) 06/17/2020   Anxiety    Arthritis    Asthma    Bronchitis    CAD (coronary artery disease) 02/13/2019   Chest pain 05/08/2019   Chest pain of uncertain etiology    Chronic bilateral low back pain without sciatica 07/11/2019   Formatting of this note might be different from the original. Added automatically from request for surgery [id number]   Chronic obstructive pulmonary disease (HCC) 02/09/2017   Formatting of this note might be different from the original. Last Assessment & Plan:  I will review her PFTs after I obtain records from her pulmonologist. Based on this we will decide if she needs inhalers, subjectively they do not seem to have helped her at all. She may benefit from a pulmonary rehabilitation program Formatting of this note might be different from the original. Last Assessment    COPD (chronic obstructive pulmonary disease) (HCC) 02/09/2017   Coronary artery disease involving native coronary artery  of native heart without angina pectoris 06/10/2015   Diabetes mellitus due to underlying condition with unspecified complications (HCC) 06/10/2015   Dyslipidemia 06/10/2015   Esophageal dysphagia 09/02/2019   Formatting of this note might be different from the original. Added automatically from request for surgery [id number]   Essential hypertension 06/10/2015   Gastroparesis 09/04/2019   GERD (gastroesophageal reflux disease)    H/O hiatal hernia    Headache 09/24/2018   Heart murmur    Hyperlipemia    Hypertension    Iron  deficiency anemia 09/25/2020   Lumbar radicular pain 08/08/2022   Lumbar radiculopathy 12/18/2020   Mixed dyslipidemia 06/10/2015   Moderate aortic stenosis 05/16/2019   Nausea and vomiting 06/16/2020   Neurogenic claudication 12/18/2020   OSA (obstructive sleep apnea) 02/09/2017   Pain and swelling of toe of right foot 07/15/2016   Palpitations 06/02/2016   Paraesophageal hernia 10/30/2019   Formatting of this note might be different from the original. Added automatically from request for surgery [id number]   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   S/P lumbar fusion 03/26/2020   Shortness of breath    on  excertion   Sleep apnea     Past Surgical History:  Procedure Laterality Date   ABDOMINAL HYSTERECTOMY     ABDOMINAL HYSTERECTOMY     BIOPSY  03/22/2021   Procedure: BIOPSY;  Surgeon: Lemar Lofty., MD;  Location: [facility];  Service: Gastroenterology;;   BREAST SURGERY     begin mass,left  breast   COLONOSCOPY  08/17/2015   Colonic polyp status post polypectomy. Mild pancolonic diverticulosis. Highly redundant colon.    DILATION AND CURETTAGE OF UTERUS     one   ENDOSCOPIC MUCOSAL RESECTION N/A 03/22/2021   Procedure: ENDOSCOPIC MUCOSAL RESECTION;  Surgeon: Meridee Score Netty Starring., MD;  Location: [facility];  Service: Gastroenterology;  Laterality: N/A;   ESOPHAGOGASTRODUODENOSCOPY  08/17/2015   Moderate hiatal hernia. Otherwise noraml EGD.    ESOPHAGOGASTRODUODENOSCOPY  09/03/2019   [facility]   ESOPHAGOGASTRODUODENOSCOPY (EGD) WITH PROPOFOL N/A 03/22/2021   Procedure: ESOPHAGOGASTRODUODENOSCOPY (EGD) WITH PROPOFOL;  Surgeon: Meridee Score Netty Starring., MD;  Location: [facility];  Service: Gastroenterology;  Laterality: N/A;   EUS  05/19/2011   Procedure: UPPER ENDOSCOPIC ULTRASOUND (EUS) LINEAR;  Surgeon: Rob Bunting, MD;  Location: [facility];  Service: Endoscopy;  Laterality: N/A;  radial linear    HEMOSTASIS CLIP PLACEMENT  03/22/2021    Procedure: HEMOSTASIS CLIP PLACEMENT;  Surgeon: Lemar Lofty., MD;  Location: [facility];  Service: Gastroenterology;;   HERNIA REPAIR  05/09/2020   HYSTEROTOMY     KNEE SURGERY     LEFT HEART CATH AND CORONARY ANGIOGRAPHY     LEFT HEART CATH AND CORONARY ANGIOGRAPHY N/A 05/08/2019   Procedure: LEFT HEART CATH AND CORONARY ANGIOGRAPHY;  Surgeon: Kathleene Hazel, MD;  Location: [facility];  Service: Cardiovascular;  Laterality: N/A;   right thumb     tendon repaire   RIGHT/LEFT HEART CATH AND CORONARY ANGIOGRAPHY N/A 06/23/2020   Procedure: RIGHT/LEFT HEART CATH AND CORONARY ANGIOGRAPHY;  Surgeon: Swazil[place], Peter M, MD;  Location: [facility] CV LAB;  Service: Cardiovascular;  Laterality: N/A;   SHOULDER SURGERY     rotator cuff repair   SUBMUCOSAL LIFTING INJECTION  03/22/2021   Procedure: SUBMUCOSAL LIFTING INJECTION;  Surgeon: Lemar Lofty., MD;  Location: [facility];  Service: Gastroenterology;;   TRANSFORAMINAL LUMBAR INTERBODY FUSION L4-5   02/27/2020   WRIST SURGERY     rigth  Current Medications: Current Meds  Medication Sig   albuterol (PROVENTIL HFA;VENTOLIN HFA) 108 (90 Base) MCG/ACT inhaler Inhale 2 puffs into the lungs every 6 (six) hours as needed for wheezing or shortness of breath.   azelastine (ASTELIN) 0.1 % nasal spray Use two sprays in each nostril twice daily as directed for nasal drainage.   BREZTRI AEROSPHERE 160-9-4.8 MCG/ACT AERO Inhale 1-2 puffs into the lungs daily.   buPROPion (WELLBUTRIN XL) 300 MG 24 hr tablet Take 300 mg by mouth daily.    canagliflozin (INVOKANA) 300 MG TABS tablet Take 300 mg by mouth daily.   Cholecalciferol (VITAMIN D3) 1.25 MG (50000 UT) CAPS Take 50,000 Units by mouth every 30 (thirty) days.   clopidogrel (PLAVIX) 75 MG tablet Take 1 tablet (75 mg total) by mouth daily.   diclofenac Sodium (VOLTAREN) 1 % GEL Apply 1 g topically as needed (Joint pain).   donepezil (ARICEPT) 10 MG tablet Take  10 mg by mouth daily at 12 noon.   EPINEPHrine 0.3 mg/0.3 mL IJ SOAJ injection Inject 0.3 mg into the muscle as needed for anaphylaxis.   escitalopram (LEXAPRO) 20 MG tablet Take 20 mg by mouth daily.   famotidine (PEPCID) 40 MG tablet Take 1 tablet (40 mg total) by mouth 2 (two) times daily.   fluticasone (FLONASE) 50 MCG/ACT nasal spray Place 2 sprays into both nostrils daily.   Fluticasone-Umeclidin-Vilant (TRELEGY ELLIPTA) 200-62.5-25 MCG/INH AEPB Inhale 1 puff into the lungs daily.   furosemide (LASIX) 20 MG tablet Take 20 mg by mouth daily.   isosorbide mononitrate (IMDUR) 120 MG 24 hr tablet Take 1 tablet by mouth once daily   levocetirizine (XYZAL) 5 MG tablet TAKE 1 TABLET BY MOUTH ONCE DAILY IN THE EVENING   lidocaine (LIDODERM) 5 % Place 1 patch onto the skin daily.   LINZESS 72 MCG capsule TAKE 1 CAPSULE BEFORE BREAKFAST   liraglutide (VICTOZA) 18 MG/3ML SOPN Inject 1.8 mg into the skin daily.   losartan-hydrochlorothiazide (HYZAAR) 100-25 MG tablet Take 1 tablet by mouth daily.   montelukast (SINGULAIR) 10 MG tablet Take 10 mg by mouth at bedtime.    Multiple Vitamin (MULTIVITAMIN) capsule Take 1 capsule by mouth daily.   nitroGLYCERIN (NITROSTAT) 0.4 MG SL tablet Place 0.4 mg under the tongue every 5 (five) minutes as needed for chest pain.   pantoprazole (PROTONIX) 40 MG tablet Take 1 tablet (40 mg total) by mouth 2 (two) times daily before a meal.   Potassium Chloride CR (MICRO-K) 8 MEQ CPCR capsule CR Take 1 capsule (8 mEq total) by mouth daily.   pregabalin (LYRICA) 50 MG capsule Take 50 mg by mouth daily.   rosuvastatin (CRESTOR) 20 MG tablet Take 20 mg by mouth daily.   traZODone (DESYREL) 50 MG tablet Take 50 mg by mouth at bedtime.     Allergies:   Penicillins, Ampicillin, and Tramadol   Social History   Socioeconomic History   Marital status: Single    Spouse name: Not on file   Number of children: 3   Years of education: Not on file   Highest education level:  Not on file  Occupational History   Occupation: retired  Tobacco Use   Smoking status: Former    Packs/day: 1    Types: Cigarettes    Quit date: 05/17/2011    Years since quitting: 11.3   Smokeless tobacco: Never  Vaping Use   Vaping Use: Never used  Substance and Sexual Activity   Alcohol use: No  Drug use: No   Sexual activity: Never  Other Topics Concern   Not on file  Social History Narrative   Right handed   Social Determinants of Health   Financial Resource Strain: Not on file  Food Insecurity: Not on file  Transportation Needs: Not on file  Physical Activity: Not on file  Stress: Not on file  Social Connections: Not on file     Family History: The patient's family history includes Cancer in her sister; Diabetes in her sister; Hypertension in her daughter. There is no history of Colon cancer, Liver disease, Pancreatic cancer, or Esophageal cancer.  ROS:   Please see the history of present illness.    All other systems reviewed and are negative.  EKGs/Labs/Other Studies Reviewed:    The following studies were reviewed today: Echo report discussed with the patient at length.   Recent Labs: 08/24/2022: ALT 34; BUN 15; Creatinine, Ser 1.10; Hemoglobin 11.2; Platelets 327.0; Potassium 3.8; Sodium 141  Recent Lipid Panel    Component Value Date/Time   CHOL 159 02/03/2020 0937   TRIG 65 02/03/2020 0937   HDL 82 02/03/2020 0937   CHOLHDL 1.9 02/03/2020 0937   LDLCALC 64 02/03/2020 0937    Physical Exam:    VS:  BP 126/64   Pulse 73   Ht 5' 5.6" (1.666 m)   Wt 176 lb (79.8 kg)   SpO2 96%   BMI 28.75 kg/m     Wt Readings from Last 3 Encounters:  09/12/22 176 lb (79.8 kg)  08/24/22 174 lb (78.9 kg)  07/29/22 174 lb 9.6 oz (79.2 kg)     GEN: Patient is in no acute distress HEENT: Normal NECK: No JVD; No carotid bruits LYMPHATICS: No lymphadenopathy CARDIAC: Hear sounds regular, 2/6 systolic murmur at the apex. RESPIRATORY:  Clear to auscultation  without rales, wheezing or rhonchi  ABDOMEN: Soft, non-tender, non-distended MUSCULOSKELETAL:  No edema; No deformity  SKIN: Warm and dry NEUROLOGIC:  Alert and oriented x 3 PSYCHIATRIC:  Normal affect   Signed, Garwin Brothers, MD  09/12/2022 9:48 AM    [facility]

## 2022-09-12 NOTE — Patient Instructions (Signed)
Medication Instructions:  Your physician recommends that you continue on your current medications as directed. Please refer to the Current Medication list given to you today.  *If you need a refill on your cardiac medications before your next appointment, please call your pharmacy*   Lab Work: None ordered If you have labs (blood work) drawn today and your tests are completely normal, you will receive your results only by: MyChart Message (if you have MyChart) OR A paper copy in the mail If you have any lab test that is abnormal or we need to change your treatment, we will call you to review the results.   Testing/Procedures: None ordered   Follow-Up: At [facility], you and your health needs are our priority.  As part of our continuing mission to provide you with exceptional heart care, we have created designated Provider Care Teams.  These Care Teams include your primary Cardiologist (physician) and Advanced Practice Providers (APPs -  Physician Assistants and Nurse Practitioners) who all work together to provide you with the care you need, when you need it.  We recommend signing up for the patient portal called "MyChart".  Sign up information is provided on this After Visit Summary.  MyChart is used to connect with patients for Virtual Visits (Telemedicine).  Patients are able to view lab/test results, encounter notes, upcoming appointments, etc.  Non-urgent messages can be sent to your provider as well.   To learn more about what you can do with MyChart, go to [email or web address].    Your next appointment:   9 month(s)  The format for your next appointment:   In Person  Provider:   Rajan Revankar, MD    Other Instructions none  Important Information About Sugar       

## 2022-09-13 NOTE — Telephone Encounter (Signed)
I wanted to see if the approval for this patient to hold her blood thinner of Plavix for 7 days will be okay for this patient? Please advise

## 2022-09-13 NOTE — Telephone Encounter (Signed)
Faxed Dr. Kem Parkinson note from 09/12/2022.   DW

## 2022-09-14 NOTE — Telephone Encounter (Signed)
Ok to hold hold 7 days per Dr Tomie China

## 2022-09-15 DIAGNOSIS — M5416 Radiculopathy, lumbar region: Secondary | ICD-10-CM | POA: Diagnosis not present

## 2022-09-16 ENCOUNTER — Other Ambulatory Visit: Payer: Self-pay | Admitting: Cardiology

## 2022-09-20 DIAGNOSIS — J301 Allergic rhinitis due to pollen: Secondary | ICD-10-CM | POA: Diagnosis not present

## 2022-09-21 ENCOUNTER — Other Ambulatory Visit: Payer: Self-pay | Admitting: Oncology

## 2022-09-21 DIAGNOSIS — D508 Other iron deficiency anemias: Secondary | ICD-10-CM

## 2022-09-21 NOTE — Progress Notes (Deleted)
[facility]  [address] [place],  [place]  [address] [phone]  Clinic Day:  09/21/2022  Referring physician: Lucianne Lei, MD   HISTORY OF PRESENT ILLNESS:  The patient is a 77 y.o. female with a history of recurrent iron deficiency anemia.  Furthermore, a component of her anemia has been related to chronic renal insufficiency.  In the past, IV iron has been effective in replenishing her iron stores and normalizing her hemoglobin.  She comes in today to reassess her iron and hemoglobin levels.  Over the past few months, the patient complains of feeling weaker and colder than what she has been previously.  She also claims to have occasional black stools.  She has had multiple GI studies in the past, including her last EGD in November 2022 which revealed gastric polyps. Other prior EGDs have shown reflux esophagitis and a hiatal hernia.  A capsule endoscopy was attempted in 2023, but was not successful.  Of note, this patient does have a minimal amount of peritoneal mesothelioma, which has been followed conservatively at [facility].  VITALS:  There were no vitals taken for this visit.  Wt Readings from Last 3 Encounters:  09/12/22 176 lb (79.8 kg)  08/24/22 174 lb (78.9 kg)  07/29/22 174 lb 9.6 oz (79.2 kg)    There is no height or weight on file to calculate BMI.  Performance status (ECOG): 1  PHYSICAL EXAM:  Physical Exam Constitutional:      General: She is not in acute distress.    Appearance: Normal appearance. She is normal weight.  HENT:     Head: Normocephalic and atraumatic.  Eyes:     General: No scleral icterus.    Extraocular Movements: Extraocular movements intact.     Conjunctiva/sclera: Conjunctivae normal.     Pupils: Pupils are equal, round, and reactive to light.  Cardiovascular:     Rate and Rhythm: Normal rate and regular rhythm.     Pulses: Normal pulses.     Heart sounds: Normal heart sounds. No murmur  heard.    No friction rub. No gallop.  Pulmonary:     Effort: Pulmonary effort is normal. No respiratory distress.     Breath sounds: Normal breath sounds.  Abdominal:     General: Bowel sounds are normal. There is no distension.     Palpations: Abdomen is soft. There is no hepatomegaly, splenomegaly or mass.     Tenderness: There is no abdominal tenderness.  Musculoskeletal:        General: Normal range of motion.     Cervical back: Normal range of motion and neck supple.     Right lower leg: No edema.     Left lower leg: No edema.  Lymphadenopathy:     Cervical: No cervical adenopathy.  Skin:    General: Skin is warm and dry.  Neurological:     General: No focal deficit present.     Mental Status: She is alert and oriented to person, place, and time. Mental status is at baseline.  Psychiatric:        Mood and Affect: Mood normal.        Behavior: Behavior normal.        Thought Content: Thought content normal.        Judgment: Judgment normal.     LABS:    Latest Reference Range & Units 06/23/22 00:00  WBC  7.4 (E)  RBC 3.87 - 5.11  3.60 ! (  E)  Hemoglobin 12.0 - 16.0  8.8 ! (E)  HCT 36 - 46  28 ! (E)  Platelets 150 - 400 K/uL 370 (E)  !: Data is abnormal (E): External lab result  Latest Reference Range & Units 06/23/22 13:38  Iron 28 - 170 ug/dL 25 (L)  UIBC ug/dL 045  TIBC 409 - 811 ug/dL 914 (H)  Saturation Ratios 10.4 - 31.8 % 5 (L)  Ferritin 11 - 307 ng/mL 4 (L)  (L): Data is abnormally low (H): Data is abnormally high  ASSESSMENT & PLAN:  Assessment/Plan:  A 77 year old woman with recurrent iron deficiency anemia.  Once again, her iron parameters are very low and consistent with recurrent iron deficiency anemia.  Based upon this, I will make changes and provide a course of IV iron in the forthcoming weeks to rapidly replenish her iron stores and minimize her hemoglobin.  Although she has had extensive GI workups in the past, the fact that her iron levels are  low again warrants her to be seen by gastroenterology yet again.  I will arrange for her to be seen by them in the forthcoming weeks.  Otherwise, I will see this patient back in 3 months to reassess her iron and hemoglobin levels to see how well they responded to her upcoming IV iron.  The patient understands all the plans discussed today and is in agreement with them.   [name] Kirby Funk, MD

## 2022-09-22 ENCOUNTER — Inpatient Hospital Stay: Payer: Medicare HMO | Admitting: Oncology

## 2022-09-22 ENCOUNTER — Inpatient Hospital Stay: Payer: Medicare HMO

## 2022-09-27 ENCOUNTER — Encounter (HOSPITAL_COMMUNITY): Payer: Self-pay | Admitting: Gastroenterology

## 2022-09-27 DIAGNOSIS — H61303 Acquired stenosis of external ear canal, unspecified, bilateral: Secondary | ICD-10-CM | POA: Diagnosis not present

## 2022-09-27 DIAGNOSIS — H6123 Impacted cerumen, bilateral: Secondary | ICD-10-CM | POA: Diagnosis not present

## 2022-09-29 ENCOUNTER — Other Ambulatory Visit: Payer: Self-pay | Admitting: Gastroenterology

## 2022-10-06 ENCOUNTER — Ambulatory Visit (HOSPITAL_COMMUNITY)
Admission: RE | Admit: 2022-10-06 | Discharge: 2022-10-06 | Disposition: A | Discharge status: Home / Self Care | Payer: Medicare HMO | Attending: Gastroenterology | Admitting: Gastroenterology

## 2022-10-06 ENCOUNTER — Other Ambulatory Visit: Payer: Self-pay

## 2022-10-06 ENCOUNTER — Encounter (HOSPITAL_COMMUNITY): Payer: Self-pay | Admitting: Gastroenterology

## 2022-10-06 ENCOUNTER — Encounter (HOSPITAL_COMMUNITY)
Admission: RE | Disposition: A | Discharge status: Home / Self Care | Payer: Self-pay | Source: Home / Self Care | Attending: Gastroenterology

## 2022-10-06 ENCOUNTER — Ambulatory Visit (HOSPITAL_COMMUNITY): Payer: Medicare HMO | Admitting: Anesthesiology

## 2022-10-06 ENCOUNTER — Ambulatory Visit (HOSPITAL_BASED_OUTPATIENT_CLINIC_OR_DEPARTMENT_OTHER): Payer: Medicare HMO | Admitting: Anesthesiology

## 2022-10-06 DIAGNOSIS — R141 Gas pain: Secondary | ICD-10-CM

## 2022-10-06 DIAGNOSIS — I340 Nonrheumatic mitral (valve) insufficiency: Secondary | ICD-10-CM | POA: Insufficient documentation

## 2022-10-06 DIAGNOSIS — J449 Chronic obstructive pulmonary disease, unspecified: Secondary | ICD-10-CM

## 2022-10-06 DIAGNOSIS — K449 Diaphragmatic hernia without obstruction or gangrene: Secondary | ICD-10-CM | POA: Diagnosis not present

## 2022-10-06 DIAGNOSIS — K31819 Angiodysplasia of stomach and duodenum without bleeding: Secondary | ICD-10-CM

## 2022-10-06 DIAGNOSIS — D509 Iron deficiency anemia, unspecified: Secondary | ICD-10-CM | POA: Diagnosis not present

## 2022-10-06 DIAGNOSIS — K317 Polyp of stomach and duodenum: Secondary | ICD-10-CM

## 2022-10-06 DIAGNOSIS — K3184 Gastroparesis: Secondary | ICD-10-CM

## 2022-10-06 DIAGNOSIS — E1122 Type 2 diabetes mellitus with diabetic chronic kidney disease: Secondary | ICD-10-CM | POA: Diagnosis not present

## 2022-10-06 DIAGNOSIS — K219 Gastro-esophageal reflux disease without esophagitis: Secondary | ICD-10-CM

## 2022-10-06 DIAGNOSIS — I10 Essential (primary) hypertension: Secondary | ICD-10-CM | POA: Diagnosis not present

## 2022-10-06 DIAGNOSIS — Z7902 Long term (current) use of antithrombotics/antiplatelets: Secondary | ICD-10-CM | POA: Insufficient documentation

## 2022-10-06 DIAGNOSIS — R195 Other fecal abnormalities: Secondary | ICD-10-CM

## 2022-10-06 DIAGNOSIS — R140 Abdominal distension (gaseous): Secondary | ICD-10-CM

## 2022-10-06 DIAGNOSIS — G4733 Obstructive sleep apnea (adult) (pediatric): Secondary | ICD-10-CM | POA: Insufficient documentation

## 2022-10-06 DIAGNOSIS — F419 Anxiety disorder, unspecified: Secondary | ICD-10-CM | POA: Insufficient documentation

## 2022-10-06 DIAGNOSIS — E119 Type 2 diabetes mellitus without complications: Secondary | ICD-10-CM | POA: Diagnosis not present

## 2022-10-06 DIAGNOSIS — Z79899 Other long term (current) drug therapy: Secondary | ICD-10-CM | POA: Insufficient documentation

## 2022-10-06 DIAGNOSIS — I2510 Atherosclerotic heart disease of native coronary artery without angina pectoris: Secondary | ICD-10-CM | POA: Diagnosis not present

## 2022-10-06 DIAGNOSIS — Z87891 Personal history of nicotine dependence: Secondary | ICD-10-CM | POA: Insufficient documentation

## 2022-10-06 DIAGNOSIS — I25119 Atherosclerotic heart disease of native coronary artery with unspecified angina pectoris: Secondary | ICD-10-CM | POA: Diagnosis not present

## 2022-10-06 DIAGNOSIS — I700 Atherosclerosis of aorta: Secondary | ICD-10-CM | POA: Insufficient documentation

## 2022-10-06 DIAGNOSIS — C451 Mesothelioma of peritoneum: Secondary | ICD-10-CM

## 2022-10-06 HISTORY — PX: GIVENS CAPSULE STUDY: SHX5432

## 2022-10-06 HISTORY — PX: HOT HEMOSTASIS: SHX5433

## 2022-10-06 HISTORY — PX: ENTEROSCOPY: SHX5533

## 2022-10-06 HISTORY — DX: Other fecal abnormalities: R19.5

## 2022-10-06 LAB — GLUCOSE, CAPILLARY
Glucose-Capillary: 108 mg/dL — ABNORMAL HIGH (ref 70–99)
Glucose-Capillary: 113 mg/dL — ABNORMAL HIGH (ref 70–99)

## 2022-10-06 SURGERY — ENTEROSCOPY
Anesthesia: Monitor Anesthesia Care

## 2022-10-06 MED ORDER — SODIUM CHLORIDE 0.9 % IV SOLN: INTRAVENOUS | Status: DC

## 2022-10-06 MED ORDER — PROPOFOL 500 MG/50ML IV EMUL
INTRAVENOUS | Status: DC | PRN
  Administered 2022-10-06: 80 ug/kg/min via INTRAVENOUS

## 2022-10-06 MED ORDER — LACTATED RINGERS IV SOLN: INTRAVENOUS | Status: DC

## 2022-10-06 MED ORDER — LIDOCAINE 2% (20 MG/ML) 5 ML SYRINGE
INTRAMUSCULAR | Status: DC | PRN
  Administered 2022-10-06: 40 mg via INTRAVENOUS

## 2022-10-06 MED ORDER — GLUCAGON HCL RDNA (DIAGNOSTIC) 1 MG IJ SOLR
INTRAMUSCULAR | Status: DC | PRN
  Administered 2022-10-06 (×2): .25 mg via INTRAVENOUS

## 2022-10-06 MED ORDER — PROPOFOL 10 MG/ML IV BOLUS
INTRAVENOUS | Status: DC | PRN
  Administered 2022-10-06 (×2): 20 mg via INTRAVENOUS
  Administered 2022-10-06: 10 mg via INTRAVENOUS
  Administered 2022-10-06: 40 mg via INTRAVENOUS

## 2022-10-06 SURGICAL SUPPLY — 1 items: TOWEL COTTON PACK 4EA (MISCELLANEOUS) ×2 IMPLANT

## 2022-10-06 NOTE — H&P (Signed)
IMPRESSION and PLAN:     #1.  Bloating/Eructation/flatulence: Likely due to aerophagia, likely functional.     #2. Gastroparesis. Reglan stopped d/t tardive dyskinesia.   #3. Recurrent symptomatic IDA with H+ stools. S/P multiple iron infusoins.  Followed by hematology. Neg PET 06/2022. CT chest Abdo/pelvis March 2023 shows peritoneal mesothelioma to be quiescent.  Majority of the hyperplastic gastric polyps were removed on EGD 03/2021 with Dr. Meridee Score.  She had neg colonoscopy 12/2018. VCE 09/2021 was incomplete d/t gastric retention.   #2. GERD with large HH s/p repair Nov 2021 with small residual/recurrent hernia on EGD with reflux esophagitis.   #3. Peritoneal mesothelioma (low grade) at time of [facility] repair. Stable. Last CT chest/A/P March 2023 without progression. Being followed by Dr Lenis Noon. Neg PET 06/2022       Plan: -Continue Protonix 40mg  po QD -Pepcid 20 mg p.o. QHS -Continue IV iron as per hematology -CBC, CMP. -Enteroscopy at [facility] with VCE capsule drop, off plavix x 7 days. She has been cleared by cardiology -RTC 12 weeks. -D/W pt and daughter. -If any active bleeding, they will promptly get in touch with [place]. HPI:     Chief Complaint:    Deborah Hutchinson is a 77 y.o. female with CAD on plavix, DM2, HH, OSA, COPD, LBP d/t OA, HTN, HLD.   With recurrent symptomatic IDA despite IV iron  Hb 8.8, MCV 77 with ferritin 4, saturation 6% 06/23/2022 S/p another iron infusion  Most recent Hb 11 07/15/2022   Had dark stools previously.  Note he she has been on p.o. iron as well.   Most recent CT chest Abdo/pelvis March 2023 shows peritoneal mesothelioma to be quiescent.  Majority of the hyperplastic gastric polyps were removed on EGD 03/2021 with Dr. Meridee Score.  She had negative colonoscopy 12/2018 as below.   Had heartburn, postprandial abdominal bloating without nausea/vomiting.  No abdominal pain.  No diarrhea or constipation.  She denies having any weight  loss.   Complains of constant burping.   Continues to be Plavix   Also has mild renal insufficiency.   With H+ stools, likely d/t Mod sized gastric polyps (hyperplastic) in setting of Plavix for CAD. Neg colon 11/2020   No pica to ice.  No over-the-counter nonsteroidals.   Unfortunately had a fall with right face hematoma.       Previous notes: S/P HH repair by Dr. Carolynn Sayers November 2021. Was found to have low-grade mesothelioma.  She is closely being followed by Dr. Lenis Noon.  No worsening. CT Abdo/pelvis March 2022, 07/2021 as below which did not show any progression.         Past GI procedures:   PET 06/2022 IMPRESSION:  1. No tracer avid solid nodule or mass identified.  2. As noted previously there are multiple ground-glass nodules  identified within both lungs. These do not demonstrate any  appreciable tracer uptake. The largest ground-glass nodule is in the  periphery of the left upper lobe measuring 1.3 cm. Absent FDG uptake  does not exclude the possibility of underlying indolent neoplastic  process such as pulmonary adenocarcinoma and continued interval  surveillance of these nodules is strongly recommended.  3. Stable left adrenal adenoma.  4. Coronary artery calcifications.  5.  Aortic Atherosclerosis (ICD10-I70.0).    CT chest/AP 07/2021 1.  No significant change in appearance of thickening of the diaphragm status post paraesophageal hernia repair. No definite new peritoneal disease or diaphragmatic nodularity identified.  2.  Similar groundglass opacities/nodules throughout the lungs bilaterally.  3.  Similar bilateral adrenal nodules. Can consider MR imaging for further characterization as clinically indicated.   EGD 03/2021 (Dr Meridee Score) - No gross lesions in esophagus proximally. LA Grade A esophagitis with no bleeding distally (this is improved from prior). - Z-line irregular, 34 cm from the incisors. - 3 cm hiatal hernia. - Four gastric polyps. Resected  and retrieved. Clips (MR conditional) were placed. - Gastritis. Biopsied. - No gross lesions in the duodenal bulb, in the first portion of the duodenum and in the second portion of the duodenum.     EGD 11/27/2020 - LA Grade C reflux esophagitis with no bleeding. Bx- neg - 3 cm hiatal hernia. S/P fundoplication. - 3 10-12 mm semi-sessile polyps with no bleeding and no stigmata of recent bleeding were found in the gastric body (1 at the diaphragmatic hiatus and the other 2 in proximal body of the stomach). Bx- Hyperplastic - Normal examined duodenum.   Colon 11/27/2020 - Seven 6 to 8 mm polyps in the mid descending colon, in the mid transverse colon, in the proximal ascending colon and in the mid ascending colon, removed with a cold snare. Resected and retrieved. Bx- TAs - Diverticulosis in the sigmoid colon and in the ascending colon.   EGD 08/2019: Candida esophagitis, no stricture, 7 cm hiatal hernia.  Treated empirically for Candida esophagitis  EGD 12/14/2018: Large HH, hyperplastic gastric polyps, eso stricture s/p dil 50 Fr. EGD 08/17/2015 mod HH, neg SB Bx for celiac.   UGI series 04/2019: HH, GERD, no strictures.  Ba tab passed without any problems.   Colonoscopy 12/14/2018-colonic polyps s/p polypectomy, mild pancolonic div. Bx- TA.   CT AP with contrast 02/09/2018: Neg, Stable left adrenal adenoma, small right inguinal hernia.   GES 08/2019: delayed gastric emptying (12% emptied at hr 1, 29% at hr 2, 47% at hr 3, 64% an hr 4).   CT AP 07/2020 Impression: 1. Mildly thickened appearance of the hemidiaphragms without a discrete measurable mass, overall similar relative to the prior CT dated 01/08/2020 in this patient with biopsy-proven peritoneal mesothelioma involving the right hemidiaphragm. No definite new peritoneal nodules are identified. 2. No ascites. 3. Similar groundglass opacities/nodules throughout the lungs bilaterally, most conspicuous in the right lower lobe. These findings  may be infectious, inflammatory, or neoplastic in etiology. 4. 4 mm solid right lower lobe pulmonary nodule, unchanged. The previously identified solid nodule in the right upper lobe is no longer seen. 5. Indeterminate bilateral adrenal lesions, measuring up to 1.7 cm on the left. Consider recharacterization with MRI is recommended. 6. Multiple additional ancillary findings, as above.   ============================  PATHOLOGY: PATHOLOGY: Final Pathologic Diagnosis  A. "Frozen peritoneal nodule":        Mesothelioma, epithelioid type, low tumor grade, trabecular pattern.       See comment.   B. "Peritoneal nodule":       Mesothelioma, epithelioid type, low tumor grade, trabecular pattern.       See comment.        LHC 05/08/2019: Prox RCA lesion is 50% stenosed. Prox LAD lesion is 40% stenosed. The left ventricular systolic function is normal. LV end diastolic pressure is normal. The left ventricular ejection fraction is greater than 65% by visual estimate. There is no mitral valve regurgitation.     Past Medical History:  Diagnosis Date   Aftercare following surgery 03/26/2020   Allergy     Angina pectoris 06/17/2020   Anxiety  Arthritis     Asthma     Bronchitis     CAD (coronary artery disease) 02/13/2019   Chest pain 05/08/2019   Chest pain of uncertain etiology     Chronic bilateral low back pain without sciatica 07/11/2019    Formatting of this note might be different from the original. Added automatically from request for surgery [id number]   Chronic obstructive pulmonary disease 02/09/2017    Formatting of this note might be different from the original. Last Assessment & Plan:  I will review her PFTs after I obtain records from her pulmonologist. Based on this we will decide if she needs inhalers, subjectively they do not seem to have helped her at all. She may benefit from a pulmonary rehabilitation program Formatting of this note might be different from the  original. Last Assessment    COPD (chronic obstructive pulmonary disease) 02/09/2017   Coronary artery disease involving native coronary artery of native heart without angina pectoris 06/10/2015   Diabetes mellitus due to underlying condition with unspecified complications 06/10/2015   Dyslipidemia 06/10/2015   Esophageal dysphagia 09/02/2019    Formatting of this note might be different from the original. 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 Added automatically from request for surgery [id number]   Peritoneal mesothelioma 01/16/2020   S/P lumbar fusion 03/26/2020   Shortness of breath      on  excertion   Sleep apnea              Current Outpatient Medications  Medication Sig Dispense Refill   albuterol (PROVENTIL HFA;VENTOLIN HFA) 108 (90 Base) MCG/ACT inhaler Inhale 2 puffs into the lungs every 6 (six) hours as needed for wheezing or shortness of breath. 3 Inhaler 3   amLODipine (NORVASC) 10 MG tablet Take 10 mg by mouth daily.       budesonide-formoterol (SYMBICORT) 160-4.5 MCG/ACT inhaler Inhale 2 puffs into the lungs 2 (two) times daily. 3 Inhaler 3   buPROPion (WELLBUTRIN XL) 300 MG 24 hr tablet Take 300 mg by mouth daily.        Cholecalciferol (VITAMIN D PO) Take 5,000 Units by mouth as directed. Every 2 weeks       clopidogrel (PLAVIX)  75 MG tablet Take 1 tablet (75 mg total) by mouth daily. 30 tablet 11   dicyclomine (BENTYL) 20 MG tablet Take 20 mg by mouth every 6 (six) hours as needed.                  escitalopram (LEXAPRO) 10 MG tablet Take 10 mg by mouth daily.       famotidine (PEPCID) 40 MG tablet Take 1 tablet (40 mg total) by mouth at bedtime. 180 tablet 3   fluticasone (VERAMYST) 27.5 MCG/SPRAY nasal spray Place 2 sprays into the nose daily.  gabapentin (NEURONTIN) 300 MG capsule Take 300 mg by mouth 3 (three) times daily as needed.        hydrALAZINE (APRESOLINE) 50 MG tablet Take 1 tablet (50 mg total) by mouth 3 (three) times daily. 180 tablet 2   hydrochlorothiazide (HYDRODIURIL) 25 MG tablet Take 1 tablet (25 mg total) by mouth daily. 60 tablet 2   Lancets (ACCU-CHEK MULTICLIX) lancets         linaclotide (LINZESS) 72 MCG capsule Take 72 mcg by mouth as needed.       losartan (COZAAR) 100 MG tablet Take 1 tablet (100 mg total) by mouth daily. 60 tablet 2   metFORMIN (GLUCOPHAGE) 500 MG tablet Take 500 mg by mouth 2 (two) times daily with a meal.        montelukast (SINGULAIR) 10 MG tablet Take 10 mg by mouth at bedtime.        Multiple Vitamin (MULTIVITAMIN) capsule Take 1 capsule by mouth daily.         oxybutynin (DITROPAN-XL) 10 MG 24 hr tablet Take 10 mg by mouth daily.       pantoprazole (PROTONIX) 40 MG tablet Take 1 tablet (40 mg total) by mouth daily. 60 tablet 2   Potassium Chloride CR (MICRO-K) 8 MEQ CPCR capsule CR TAKE 1 CAPSULE BY MOUTH ONCE DAILY FOR LOW POTASSIUM       simvastatin (ZOCOR) 20 MG tablet Take 20 mg by mouth at bedtime.       spironolactone (ALDACTONE) 25 MG tablet Take 1 tablet (25 mg total) by mouth daily. 60 tablet 2   traMADol (ULTRAM) 50 MG tablet Take 50 mg by mouth 2 (two) times daily as needed for pain.       VICTOZA 18 MG/3ML SOPN 12 mg daily.        BD PEN NEEDLE NANO U/F 32G X 4 MM MISC         isosorbide mononitrate (IMDUR) 120 MG 24 hr tablet Take 1 tablet (120  mg total) by mouth daily. (Patient not taking: Reported on 06/10/2019) 60 tablet 2   nitroGLYCERIN (NITROSTAT) 0.4 MG SL tablet DISSOLVE ONE TABLET UNDER THE TONGUE EVERY 5 MINUTES AS NEEDED FOR CHEST PAIN.  DO NOT EXCEED A TOTAL OF 3 DOSES IN 15 MINUTES (Patient not taking: Reported on 06/10/2019) 25 tablet 5    No current facility-administered medications for this visit.           Past Surgical History:  Procedure Laterality Date   ABDOMINAL HYSTERECTOMY       ABDOMINAL HYSTERECTOMY       BIOPSY   03/22/2021    Procedure: BIOPSY;  Surgeon: Meridee Score Netty Starring., MD;  Location: [facility];  Service: Gastroenterology;;   BREAST SURGERY        begin mass,left  breast   COLONOSCOPY   08/17/2015    Colonic polyp status post polypectomy. Mild pancolonic diverticulosis. Highly redundant colon.    DILATION AND CURETTAGE OF UTERUS        one   ENDOSCOPIC MUCOSAL RESECTION N/A 03/22/2021    Procedure: ENDOSCOPIC MUCOSAL RESECTION;  Surgeon: Meridee Score Netty Starring., MD;  Location: [facility];  Service: Gastroenterology;  Laterality: N/A;   ESOPHAGOGASTRODUODENOSCOPY   08/17/2015    Moderate hiatal hernia. Otherwise noraml EGD.    ESOPHAGOGASTRODUODENOSCOPY   09/03/2019    [facility]   ESOPHAGOGASTRODUODENOSCOPY (EGD) WITH PROPOFOL N/A 03/22/2021    Procedure: ESOPHAGOGASTRODUODENOSCOPY (EGD) WITH PROPOFOL;  Surgeon: Lemar Lofty., MD;  Location: [facility];  Service: Gastroenterology;  Laterality: N/A;   EUS   05/19/2011    Procedure: UPPER ENDOSCOPIC ULTRASOUND (EUS) LINEAR;  Surgeon: Rob Bunting, MD;  Location: [facility];  Service: Endoscopy;  Laterality: N/A;  radial linear    HEMOSTASIS CLIP PLACEMENT   03/22/2021    Procedure: HEMOSTASIS CLIP PLACEMENT;  Surgeon: Lemar Lofty., MD;  Location: [facility];  Service: Gastroenterology;;   HERNIA REPAIR   05/09/2020   HYSTEROTOMY       KNEE SURGERY       LEFT HEART CATH AND CORONARY ANGIOGRAPHY        LEFT HEART CATH AND CORONARY ANGIOGRAPHY N/A 05/08/2019    Procedure: LEFT HEART CATH AND CORONARY ANGIOGRAPHY;  Surgeon: Kathleene Hazel, MD;  Location: [facility] INVAS[facility] CV LAB;  Service: Cardiovascular;  Laterality: N/A;   right thumb        tendon repaire   RIGHT/LEFT HEART CATH AND CORONARY ANGIOGRAPHY N/A 06/23/2020    Procedure: RIGHT/LEFT HEART CATH AND CORONARY ANGIOGRAPHY;  Surgeon: Swazil[place], Peter M, MD;  Location: [facility] INVASIVE CV LAB;  Service: Cardiovascular;  Laterality: N/A;   SHOULDER SURGERY        rotator cuff repair   SUBMUCOSAL LIFTING INJECTION   03/22/2021    Procedure: SUBMUCOSAL LIFTING INJECTION;  Surgeon: Lemar Lofty., MD;  Location: [facility];  Service: Gastroenterology;;   TRANSFORAMINAL LUMBAR INTERBODY FUSION L4-5    02/27/2020   WRIST SURGERY        rigth           Family History  Problem Relation Age of Onset   Diabetes Sister     Cancer Sister     Hypertension Daughter     Colon cancer Neg Hx     Liver disease Neg Hx     Pancreatic cancer Neg Hx     Esophageal cancer Neg Hx        Social History         Tobacco Use   Smoking status: Former      Packs/day: 1      Types: Cigarettes      Quit date: 05/17/2011      Years since quitting: 11.2   Smokeless tobacco: Never  Vaping Use   Vaping Use: Never used  Substance Use Topics   Alcohol use: No   Drug use: No           Allergies  Allergen Reactions   Penicillins Rash      Reaction was 30years ago  Has never taken again Reaction was 30years ago  Has never taken again Has patient had a PCN reaction causing anaphylaxis, immediate rash, facial/tongue/throat swelling, SOB or lightheadedness with hypotension? no Has patient had a PCN reaction causing severe rash involving mucus membranes or skin necrosis?  no Has patient had a PCN reaction that required hospitilization?no   Has patient had a PCN reaction occurring within the last 10 years?  no If all of the above answers  are "no" then may proceed with cephalosporin use   Ampicillin Hives   Tramadol Other (See Comments)      Hallunications           Review of Systems: All systems reviewed and negative except where noted in HPI.      Physical Exam:       BP 138/70   Pulse 71   Ht 5' 5.5" (1.664 m)   Wt 174 lb (78.9 kg)   BMI  28.51 kg/m    Gen: awake, alert, NAD HEENT: anicteric, no pallor CV: RRR, no mrg Pulm: CTA b/l Abd: soft, NT/ND, +BS throughout Ext: no c/c/e Neuro: nonfocal       Latest Ref Rng & Units 07/15/2022   10:37 AM 06/23/2022   12:00 AM 12/21/2021   12:00 AM  CBC  WBC 4.0 - 10.5 K/uL 7.6  7.4     4.7      Hemoglobin 12.0 - 15.0 g/dL 16.1  8.8     09.6      Hematocrit 36.0 - 46.0 % 36.1  28     36      Platelets 150.0 - 400.0 K/uL 292.0  370     269         This result is from an external source.        Latest Ref Rng & Units 07/15/2022   10:37 AM 12/21/2021   12:00 AM 09/13/2021   12:00 AM  CMP  Glucose 70 - 99 mg/dL 92       BUN 6 - 23 mg/dL 14  12     12       Creatinine 0.40 - 1.20 mg/dL 0.45  1.2     1.2      Sodium 135 - 145 mEq/L 142  139     141      Potassium 3.5 - 5.1 mEq/L 4.0  3.1     3.8      Chloride 96 - 112 mEq/L 109  103     105      CO2 19 - 32 mEq/L 25  28     30       Calcium 8.4 - 10.5 mg/dL 9.6  9.5     8.9      Total Protein 6.0 - 8.3 g/dL 6.5       Total Bilirubin 0.2 - 1.2 mg/dL 0.3       Alkaline Phos 39 - 117 U/L 95  93     82      AST 0 - 37 U/L 23  35     32      ALT 0 - 35 U/L 32  37     33         This result is from an external source.    Attending physician's note   I have taken history, reviewed the chart and examined the patient.   For SBE Possible VCE today Hb - 11.2 4/17   Edman Circle, MD [facility] [phone]

## 2022-10-06 NOTE — Discharge Instructions (Signed)
YOU HAD AN ENDOSCOPIC PROCEDURE TODAY: Refer to the procedure report and other information in the discharge instructions given to you for any specific questions about what was found during the examination. If this information does not answer your questions, please call [place] office at [phone] to clarify.  ° °YOU SHOULD EXPECT: Some feelings of bloating in the abdomen. Passage of more gas than usual. Walking can help get rid of the air that was put into your GI tract during the procedure and reduce the bloating. If you had a lower endoscopy (such as a colonoscopy or flexible sigmoidoscopy) you may notice spotting of blood in your stool or on the toilet paper. Some abdominal soreness may be present for a day or two, also. ° °DIET: Your first meal following the procedure should be a light meal and then it is ok to progress to your normal diet. A half-sandwich or bowl of soup is an example of a good first meal. Heavy or fried foods are harder to digest and may make you feel nauseous or bloated. Drink plenty of fluids but you should avoid alcoholic beverages for 24 hours. If you had a esophageal dilation, please see attached instructions for diet.   ° °ACTIVITY: Your care partner should take you home directly after the procedure. You should plan to take it easy, moving slowly for the rest of the day. You can resume normal activity the day after the procedure however YOU SHOULD NOT DRIVE, use power tools, machinery or perform tasks that involve climbing or major physical exertion for 24 hours (because of the sedation medicines used during the test).  ° °SYMPTOMS TO REPORT IMMEDIATELY: °A gastroenterologist can be reached at any hour. Please call [phone]  for any of the following symptoms:  °Following lower endoscopy (colonoscopy, flexible sigmoidoscopy) °Excessive amounts of blood in the stool  °Significant tenderness, worsening of abdominal pains  °Swelling of the abdomen that is new, acute  °Fever of 100° or  higher  °Following upper endoscopy (EGD, EUS, ERCP, esophageal dilation) °Vomiting of blood or coffee ground material  °New, significant abdominal pain  °New, significant chest pain or pain under the shoulder blades  °Painful or persistently difficult swallowing  °New shortness of breath  °Black, tarry-looking or red, bloody stools ° °FOLLOW UP:  °If any biopsies were taken you will be contacted by phone or by letter within the next 1-3 weeks. Call [phone]  if you have not heard about the biopsies in 3 weeks.  °Please also call with any specific questions about appointments or follow up tests. ° °

## 2022-10-06 NOTE — Transfer of Care (Signed)
Immediate Anesthesia Transfer of Care Note  Patient: Deborah Hutchinson  Procedure(s) Performed: ENTEROSCOPY GIVENS CAPSULE STUDY HOT HEMOSTASIS (ARGON PLASMA COAGULATION/BICAP)  Patient Location: PACU  Anesthesia Type:MAC  Level of Consciousness: awake, alert , oriented, and patient cooperative  Airway & Oxygen Therapy: Patient Spontanous Breathing and Patient connected to face mask oxygen  Post-op Assessment: Report given to RN and Post -op Vital signs reviewed and stable  Post vital signs: Reviewed and stable  Last Vitals:  Vitals Value Taken Time  BP    Temp    Pulse 79 10/06/22 0958  Resp 17 10/06/22 0958  SpO2 100 % 10/06/22 0958  Vitals shown include unvalidated device data.  Last Pain:  Vitals:   10/06/22 0830  TempSrc: Temporal  PainSc: 6       Patients Stated Pain Goal: 6 (10/06/22 0830)  Complications: No notable events documented.

## 2022-10-06 NOTE — Anesthesia Preprocedure Evaluation (Addendum)
Anesthesia Evaluation  Patient identified by MRN, date of birth, ID band Patient awake    Reviewed: Allergy & Precautions, NPO status , Patient's Chart, lab work & pertinent test results  Airway Mallampati: II  TM Distance: >3 FB Neck ROM: Full    Dental  (+) Edentulous Upper, Edentulous Lower   Pulmonary shortness of breath and with exertion, asthma , sleep apnea , COPD, former smoker DOE for a couple years   Pulmonary exam normal breath sounds clear to auscultation       Cardiovascular hypertension, Pt. on medications + angina  + CAD  Normal cardiovascular exam+ Valvular Problems/Murmurs AS  Rhythm:Regular Rate:Normal  Echo 05/2022  1. GLS -20.3. Left ventricular ejection fraction, by estimation, is 60 to 65%. The left ventricle has normal function. The left ventricle has no regional wall motion abnormalities. Left ventricular diastolic parameters are consistent with Grade I  diastolic dysfunction (impaired relaxation).   2. Right ventricular systolic function is normal. The right ventricular size is normal. There is normal pulmonary artery systolic pressure.   3. The mitral valve is normal in structure. Mild mitral valve regurgitation. No evidence of mitral stenosis.   4. The aortic valve is calcified. There is moderate calcification of the aortic valve. There is moderate thickening of the aortic valve. Aortic valve regurgitation is not visualized. Mild aortic valve stenosis. Aortic valve area, by VTI measures 1.77  cm. Aortic valve mean gradient measures 16.4 mmHg.   5. The inferior vena cava is normal in size with greater than 50% respiratory variability, suggesting right atrial pressure of 3 mmHg.   Stress 10/2021   The study is normal. The study is low risk.   No ST deviation was noted.   Left ventricular function is normal. Nuclear stress EF: 78 %. The left ventricular ejection fraction is hyperdynamic (>65%). End diastolic  cavity size is normal.   Prior study not available for comparison.   Echo 2022  1. Left ventricular ejection fraction, by estimation, is 60 to 65%. The left ventricle has normal function. The left ventricle has no regional wall motion abnormalities. There is mild left ventricular hypertrophy. Left ventricular diastolic parameters are consistent with Grade I diastolic dysfunction (impaired relaxation).   2. Right ventricular systolic function is normal. The right ventricular size is normal. There is normal pulmonary artery systolic pressure.   3. The mitral valve is normal in structure. No evidence of mitral valve regurgitation. No evidence of mitral stenosis.   4. The aortic valve is normal in structure. There is mild calcification of the aortic valve. There is mild thickening of the aortic valve. Aortic valve regurgitation is not visualized. Mild to moderate aortic valve stenosis. Aortic valve area, by VTI  measures 1.77 cm. Aortic valve mean gradient measures 18.0 mmHg.   5. The inferior vena cava is normal in size with greater than 50% respiratory variability, suggesting right atrial pressure of 3 mmHg.     Cardiac Cath 2/22 Prox LAD lesion is 40% stenosed. Prox RCA lesion is 50% stenosed. The left ventricular systolic function is normal. LV end diastolic pressure is normal. The left ventricular ejection fraction is 55-65% by visual estimate. There is mild aortic valve stenosis.    Neuro/Psych  Headaches  Anxiety      negative psych ROS   GI/Hepatic Neg liver ROS, hiatal hernia,GERD  Medicated,,  Endo/Other  diabetes, Type 2    Renal/GU negative Renal ROS     Musculoskeletal  (+) Arthritis ,  Abdominal   Peds  Hematology  (+) Blood dyscrasia, anemia   Anesthesia Other Findings   Reproductive/Obstetrics negative OB ROS                             Anesthesia Physical Anesthesia Plan  ASA: 3  Anesthesia Plan: MAC   Post-op Pain  Management:    Induction: Intravenous  PONV Risk Score and Plan: 2 and TIVA, Propofol infusion and Treatment may vary due to age or medical condition  Airway Management Planned:   Additional Equipment: None  Intra-op Plan:   Post-operative Plan:   Informed Consent: I have reviewed the patients History and Physical, chart, labs and discussed the procedure including the risks, benefits and alternatives for the proposed anesthesia with the patient or authorized representative who has indicated his/her understanding and acceptance.     Dental advisory given  Plan Discussed with: CRNA  Anesthesia Plan Comments:         Anesthesia Quick Evaluation

## 2022-10-06 NOTE — Op Note (Addendum)
[facility] Patient Name: Deborah Hutchinson Procedure Date: 10/06/2022 MRN: [id number] Attending MD: Lynann Bologna , MD, [id number] Date of Birth: October 31, 1945 CSN: [id number] Age: 77 Admit Type: Outpatient Procedure:                Small bowel enteroscopy Indications:              Recurrent symptomatic IDA with H+ stools. S/P                            multiple iron infusoins. Followed by hematology.                           Neg PET 06/2022. 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                            Majority of the hyperplastic gastric polyps were                            removed on EGD 03/2021 with Dr. Meridee Score. She had                            neg colonoscopy 12/2018. VCE 09/2021 was incomplete                            d/t gastric retention. Providers:                Lynann Bologna, MD, Margaree Mackintosh, RN,                            Kandice Robinsons, Technician Referring MD:              Medicines:                Monitored Anesthesia Care Complications:            No immediate complications. Estimated Blood Loss:     Estimated blood loss: none. Procedure:                Pre-Anesthesia Assessment:                           - Prior to the procedure, a History and Physical                            was performed, and patient medications and                            allergies were reviewed. The patient's tolerance of                            previous anesthesia was also reviewed. The risks                            and benefits  of the procedure and the sedation                            options and risks were discussed with the patient.                            All questions were answered, and informed consent                            was obtained. Prior Anticoagulants: The patient has                            taken Plavix (clopidogrel), last dose was 7 days                             prior to procedure. ASA Grade Assessment: III - A                            patient with severe systemic disease. After                            reviewing the risks and benefits, the patient was                            deemed in satisfactory condition to undergo the                            procedure.                           After obtaining informed consent, the endoscope was                            passed under direct vision. Throughout the                            procedure, the patient's blood pressure, pulse, and                            oxygen saturations were monitored continuously. The                            [id number]-HQ190L ([id number] Olympus colonoscope was                            introduced through the mouth and advanced to the                            proximal jejunum up to 120 cm. Glucagon was                            administered on withdrawal of the scope.  After completion of enteroscopy, the GIF-H190                            ([id number]) Olympus endoscope was introduced through                            the and advanced to the second portion of the                            duodenum to deploy VCE. The small bowel enteroscopy                            was accomplished without difficulty. The patient                            tolerated the procedure well. Scope In: Scope Out: Findings:      The examined esophagus was normal with well-defined Z-line at 34 cm,       examined by NBI.      A 3 cm hiatal hernia was present. Evidence of previous partial Nissen's       fundoplication.      A few 2 to 4 mm sessile polyps with no bleeding and no stigmata of       recent bleeding were found in the gastric fundus and in the gastric       body. The larger gastric polyps have been successfully removed. A single       clip was noted in the body of the stomach along the lesser curvature.      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 Coagulation for hemostasis using argon       plasma at 0.4 liters/minute and 20 watts was successful.      There was no evidence of significant pathology in the entire examined       portion of jejunum.      VCE was successfully deployed in the second portion of the duodenum. Impression:               - 3 cm hiatal hernia.                           - A few small nonbleeding gastric polyps.                           - Two non-bleeding angiodysplastic lesions in the                            duodenum. Treated with argon plasma coagulation                            (APC).                           - The examined portion of the jejunum was normal.                           -  No specimens collected. Moderate Sedation:      Not Applicable - Patient had care per Anesthesia. Recommendation:           - Discharge patient to home.                           - Resume previous diet.                           - VCE images to be downloaded in 24 hours. I                            encourage patient to ambulate as much as possible.                           - Can resume Plavix 6/1.                           - Return to GI clinic in 12 weeks.                           - Avoid nonsteroidals.                           - Continue to follow with hematology.                           - The findings and recommendations were discussed                            with the patient's daughter. Procedure Code(s):        --- Professional ---                           [phone], Small intestinal endoscopy, enteroscopy                            beyond second portion of duodenum, not including                            ileum; diagnostic, including collection of                            specimen(s) by brushing or washing, when performed                            (separate procedure) Diagnosis Code(s):        --- Professional ---                            K44.9, Diaphragmatic hernia without obstruction or                            gangrene                           K31.7, Polyp of stomach  and duodenum                           K31.819, Angiodysplasia of stomach and duodenum                            without bleeding                           D50.9, Iron deficiency anemia, unspecified CPT copyright 2022 [employer]. All rights reserved. The codes documented in this report are preliminary and upon coder review may  be revised to meet current compliance requirements. Lynann Bologna, MD 10/06/2022 10:05:49 AM This report has been signed electronically. Number of Addenda: 0

## 2022-10-06 NOTE — Anesthesia Postprocedure Evaluation (Signed)
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  Last Vitals:  Vitals:   10/06/22 1006 10/06/22 1016  BP: (!) 169/77 (!) 171/89  Pulse: 62 60  Resp: 20 16  Temp:    SpO2: 99% 98%    Last Pain:  Vitals:   10/06/22 1016  TempSrc:   PainSc: 0-No pain                 Lewie Loron

## 2022-10-07 DIAGNOSIS — G4733 Obstructive sleep apnea (adult) (pediatric): Secondary | ICD-10-CM | POA: Diagnosis not present

## 2022-10-07 DIAGNOSIS — E1140 Type 2 diabetes mellitus with diabetic neuropathy, unspecified: Secondary | ICD-10-CM | POA: Diagnosis not present

## 2022-10-07 DIAGNOSIS — M1990 Unspecified osteoarthritis, unspecified site: Secondary | ICD-10-CM | POA: Diagnosis not present

## 2022-10-09 ENCOUNTER — Encounter (HOSPITAL_COMMUNITY): Payer: Self-pay | Admitting: Gastroenterology

## 2022-10-12 DIAGNOSIS — J301 Allergic rhinitis due to pollen: Secondary | ICD-10-CM | POA: Diagnosis not present

## 2022-10-14 DIAGNOSIS — J301 Allergic rhinitis due to pollen: Secondary | ICD-10-CM | POA: Diagnosis not present

## 2022-10-16 NOTE — Progress Notes (Signed)
[facility]  [address] [place],  [place]  [address] [phone]  Clinic Day:  10/16/2022  Referring physician: Lucianne Lei, MD   HISTORY OF PRESENT ILLNESS:  The patient is a 77 y.o. female with a history of recurrent iron deficiency anemia.  Furthermore, a component of her anemia has been related to chronic renal insufficiency.  In the past, IV iron has been effective in replenishing her iron stores and normalizing her hemoglobin.  She comes in today to reassess her iron and hemoglobin levels.  Over the past few months, the patient complains of feeling weaker and colder than what she has been previously.  She also claims to have occasional black stools.  She has had multiple GI studies in the past, including her last EGD in November 2022 which revealed gastric polyps. Other prior EGDs have shown reflux esophagitis and a hiatal hernia.  A capsule endoscopy was attempted in 2023, but was not successful.  Of note, this patient does have a minimal amount of peritoneal mesothelioma, which has been followed conservatively at [facility].  VITALS:  There were no vitals taken for this visit.  Wt Readings from Last 3 Encounters:  10/06/22 171 lb 15.3 oz (78 kg)  09/12/22 176 lb (79.8 kg)  08/24/22 174 lb (78.9 kg)    There is no height or weight on file to calculate BMI.  Performance status (ECOG): 1  PHYSICAL EXAM:  Physical Exam Constitutional:      General: She is not in acute distress.    Appearance: Normal appearance. She is normal weight.  HENT:     Head: Normocephalic and atraumatic.  Eyes:     General: No scleral icterus.    Extraocular Movements: Extraocular movements intact.     Conjunctiva/sclera: Conjunctivae normal.     Pupils: Pupils are equal, round, and reactive to light.  Cardiovascular:     Rate and Rhythm: Normal rate and regular rhythm.     Pulses: Normal pulses.     Heart sounds: Normal heart sounds. No murmur  heard.    No friction rub. No gallop.  Pulmonary:     Effort: Pulmonary effort is normal. No respiratory distress.     Breath sounds: Normal breath sounds.  Abdominal:     General: Bowel sounds are normal. There is no distension.     Palpations: Abdomen is soft. There is no hepatomegaly, splenomegaly or mass.     Tenderness: There is no abdominal tenderness.  Musculoskeletal:        General: Normal range of motion.     Cervical back: Normal range of motion and neck supple.     Right lower leg: No edema.     Left lower leg: No edema.  Lymphadenopathy:     Cervical: No cervical adenopathy.  Skin:    General: Skin is warm and dry.  Neurological:     General: No focal deficit present.     Mental Status: She is alert and oriented to person, place, and time. Mental status is at baseline.  Psychiatric:        Mood and Affect: Mood normal.        Behavior: Behavior normal.        Thought Content: Thought content normal.        Judgment: Judgment normal.     LABS:    Latest Reference Range & Units 06/23/22 00:00  WBC  7.4 (E)  RBC 3.87 - 5.11  3.60 ! (  E)  Hemoglobin 12.0 - 16.0  8.8 ! (E)  HCT 36 - 46  28 ! (E)  Platelets 150 - 400 K/uL 370 (E)  !: Data is abnormal (E): External lab result  Latest Reference Range & Units 06/23/22 13:38  Iron 28 - 170 ug/dL 25 (L)  UIBC ug/dL 829  TIBC 562 - 130 ug/dL 865 (H)  Saturation Ratios 10.4 - 31.8 % 5 (L)  Ferritin 11 - 307 ng/mL 4 (L)  (L): Data is abnormally low (H): Data is abnormally high  ASSESSMENT & PLAN:  Assessment/Plan:  A 77 year old woman with recurrent iron deficiency anemia.  Once again, her iron parameters are very low and consistent with recurrent iron deficiency anemia.  Based upon this, I will make changes and provide a course of IV iron in the forthcoming weeks to rapidly replenish her iron stores and minimize her hemoglobin.  Although she has had extensive GI workups in the past, the fact that her iron levels are  low again warrants her to be seen by gastroenterology yet again.  I will arrange for her to be seen by them in the forthcoming weeks.  Otherwise, I will see this patient back in 3 months to reassess her iron and hemoglobin levels to see how well they responded to her upcoming IV iron.  The patient understands all the plans discussed today and is in agreement with them.   Charnell Peplinski Kirby Funk, MD

## 2022-10-17 ENCOUNTER — Inpatient Hospital Stay (INDEPENDENT_AMBULATORY_CARE_PROVIDER_SITE_OTHER): Payer: Medicare HMO | Admitting: Oncology

## 2022-10-17 ENCOUNTER — Inpatient Hospital Stay: Payer: Medicare HMO | Attending: Oncology

## 2022-10-17 ENCOUNTER — Other Ambulatory Visit: Payer: Self-pay | Admitting: Oncology

## 2022-10-17 VITALS — BP 193/91 | HR 76 | Temp 98.2°F | Resp 16 | Ht 65.6 in | Wt 179.0 lb

## 2022-10-17 DIAGNOSIS — D508 Other iron deficiency anemias: Secondary | ICD-10-CM

## 2022-10-17 DIAGNOSIS — E782 Mixed hyperlipidemia: Secondary | ICD-10-CM | POA: Diagnosis not present

## 2022-10-17 DIAGNOSIS — N189 Chronic kidney disease, unspecified: Secondary | ICD-10-CM | POA: Insufficient documentation

## 2022-10-17 DIAGNOSIS — D509 Iron deficiency anemia, unspecified: Secondary | ICD-10-CM | POA: Insufficient documentation

## 2022-10-17 DIAGNOSIS — D649 Anemia, unspecified: Secondary | ICD-10-CM | POA: Diagnosis not present

## 2022-10-17 LAB — CBC AND DIFFERENTIAL
HCT: 34 — AB (ref 36–46)
Hemoglobin: 11.2 — AB (ref 12.0–16.0)
Neutrophils Absolute: 3.72
Platelets: 406 10*3/uL — AB (ref 150–400)
WBC: 6.2

## 2022-10-17 LAB — FERRITIN: Ferritin: 8 ng/mL — ABNORMAL LOW (ref 11–307)

## 2022-10-17 LAB — IRON AND TIBC
Iron: 45 ug/dL (ref 28–170)
Saturation Ratios: 10 % — ABNORMAL LOW (ref 10.4–31.8)
TIBC: 465 ug/dL — ABNORMAL HIGH (ref 250–450)
UIBC: 420 ug/dL

## 2022-10-17 LAB — CBC: RBC: 4.04 (ref 3.87–5.11)

## 2022-10-18 ENCOUNTER — Telehealth: Payer: Self-pay | Admitting: Oncology

## 2022-10-18 DIAGNOSIS — R5383 Other fatigue: Secondary | ICD-10-CM | POA: Diagnosis not present

## 2022-10-18 DIAGNOSIS — J301 Allergic rhinitis due to pollen: Secondary | ICD-10-CM | POA: Diagnosis not present

## 2022-10-18 DIAGNOSIS — G4733 Obstructive sleep apnea (adult) (pediatric): Secondary | ICD-10-CM | POA: Diagnosis not present

## 2022-10-18 DIAGNOSIS — Z8616 Personal history of COVID-19: Secondary | ICD-10-CM | POA: Diagnosis not present

## 2022-10-18 DIAGNOSIS — R918 Other nonspecific abnormal finding of lung field: Secondary | ICD-10-CM | POA: Diagnosis not present

## 2022-10-18 DIAGNOSIS — J4520 Mild intermittent asthma, uncomplicated: Secondary | ICD-10-CM | POA: Diagnosis not present

## 2022-10-18 NOTE — Telephone Encounter (Signed)
Patient has been scheduled. Aware of appt date and time.   Scheduling Message Entered by Rennis Harding A on 10/17/2022 at  2:57 PM Priority: Routine <No visit type provided>  Department: [facility]  Provider:  Scheduling Notes:  Labs and f/u appt in 02-16-23

## 2022-10-19 ENCOUNTER — Ambulatory Visit: Payer: Medicare HMO | Admitting: Gastroenterology

## 2022-10-21 DIAGNOSIS — L309 Dermatitis, unspecified: Secondary | ICD-10-CM | POA: Diagnosis not present

## 2022-10-21 DIAGNOSIS — B372 Candidiasis of skin and nail: Secondary | ICD-10-CM | POA: Diagnosis not present

## 2022-10-21 DIAGNOSIS — Z6828 Body mass index (BMI) 28.0-28.9, adult: Secondary | ICD-10-CM | POA: Diagnosis not present

## 2022-10-25 ENCOUNTER — Encounter: Payer: Self-pay | Admitting: Oncology

## 2022-10-27 MED FILL — Ferumoxytol Inj 510 MG/17ML (30 MG/ML) (Elemental Fe): INTRAVENOUS | Qty: 17 | Status: AC

## 2022-10-28 ENCOUNTER — Inpatient Hospital Stay: Payer: Medicare HMO

## 2022-10-28 VITALS — BP 180/86 | HR 74 | Temp 97.5°F | Resp 18 | Ht 65.6 in | Wt 182.1 lb

## 2022-10-28 DIAGNOSIS — D508 Other iron deficiency anemias: Secondary | ICD-10-CM

## 2022-10-28 DIAGNOSIS — N189 Chronic kidney disease, unspecified: Secondary | ICD-10-CM | POA: Diagnosis not present

## 2022-10-28 DIAGNOSIS — D509 Iron deficiency anemia, unspecified: Secondary | ICD-10-CM | POA: Diagnosis not present

## 2022-10-28 DIAGNOSIS — J301 Allergic rhinitis due to pollen: Secondary | ICD-10-CM | POA: Diagnosis not present

## 2022-10-28 MED ORDER — SODIUM CHLORIDE 0.9 % IV SOLN: Freq: Once | INTRAVENOUS | Status: AC

## 2022-10-28 MED ORDER — SODIUM CHLORIDE 0.9 % IV SOLN
510.0000 mg | Freq: Once | INTRAVENOUS | Status: AC
  Administered 2022-10-28: 510 mg via INTRAVENOUS
  Filled 2022-10-28: qty 510

## 2022-10-28 NOTE — Patient Instructions (Signed)

## 2022-11-03 ENCOUNTER — Ambulatory Visit: Payer: Medicare HMO | Admitting: Gastroenterology

## 2022-11-03 MED FILL — Ferumoxytol Inj 510 MG/17ML (30 MG/ML) (Elemental Fe): INTRAVENOUS | Qty: 17 | Status: AC

## 2022-11-04 ENCOUNTER — Inpatient Hospital Stay: Payer: Medicare HMO

## 2022-11-04 VITALS — BP 155/88 | HR 76

## 2022-11-04 DIAGNOSIS — D509 Iron deficiency anemia, unspecified: Secondary | ICD-10-CM | POA: Diagnosis not present

## 2022-11-04 DIAGNOSIS — N189 Chronic kidney disease, unspecified: Secondary | ICD-10-CM | POA: Diagnosis not present

## 2022-11-04 DIAGNOSIS — D508 Other iron deficiency anemias: Secondary | ICD-10-CM

## 2022-11-04 MED ORDER — SODIUM CHLORIDE 0.9 % IV SOLN: Freq: Once | INTRAVENOUS | Status: AC

## 2022-11-04 MED ORDER — SODIUM CHLORIDE 0.9 % IV SOLN
510.0000 mg | Freq: Once | INTRAVENOUS | Status: AC
  Administered 2022-11-04: 510 mg via INTRAVENOUS
  Filled 2022-11-04: qty 510

## 2022-11-04 NOTE — Patient Instructions (Signed)

## 2022-11-16 ENCOUNTER — Ambulatory Visit (INDEPENDENT_AMBULATORY_CARE_PROVIDER_SITE_OTHER): Payer: Medicare HMO | Admitting: Gastroenterology

## 2022-11-16 ENCOUNTER — Other Ambulatory Visit: Payer: Self-pay

## 2022-11-16 ENCOUNTER — Other Ambulatory Visit: Payer: Self-pay | Admitting: Gastroenterology

## 2022-11-16 ENCOUNTER — Ambulatory Visit (INDEPENDENT_AMBULATORY_CARE_PROVIDER_SITE_OTHER)
Admission: RE | Admit: 2022-11-16 | Discharge: 2022-11-16 | Disposition: A | Discharge status: Home / Self Care | Payer: Medicare HMO | Source: Ambulatory Visit | Attending: Gastroenterology | Admitting: Gastroenterology

## 2022-11-16 ENCOUNTER — Encounter: Payer: Self-pay | Admitting: Gastroenterology

## 2022-11-16 ENCOUNTER — Telehealth: Payer: Self-pay | Admitting: Cardiology

## 2022-11-16 VITALS — BP 122/78 | HR 66 | Ht 65.0 in | Wt 179.0 lb

## 2022-11-16 DIAGNOSIS — R198 Other specified symptoms and signs involving the digestive system and abdomen: Secondary | ICD-10-CM

## 2022-11-16 DIAGNOSIS — R9389 Abnormal findings on diagnostic imaging of other specified body structures: Secondary | ICD-10-CM

## 2022-11-16 DIAGNOSIS — K5900 Constipation, unspecified: Secondary | ICD-10-CM | POA: Diagnosis not present

## 2022-11-16 DIAGNOSIS — R1030 Lower abdominal pain, unspecified: Secondary | ICD-10-CM

## 2022-11-16 DIAGNOSIS — K3184 Gastroparesis: Secondary | ICD-10-CM | POA: Diagnosis not present

## 2022-11-16 DIAGNOSIS — K449 Diaphragmatic hernia without obstruction or gangrene: Secondary | ICD-10-CM

## 2022-11-16 DIAGNOSIS — D509 Iron deficiency anemia, unspecified: Secondary | ICD-10-CM

## 2022-11-16 DIAGNOSIS — K219 Gastro-esophageal reflux disease without esophagitis: Secondary | ICD-10-CM

## 2022-11-16 DIAGNOSIS — I878 Other specified disorders of veins: Secondary | ICD-10-CM | POA: Diagnosis not present

## 2022-11-16 DIAGNOSIS — R197 Diarrhea, unspecified: Secondary | ICD-10-CM | POA: Diagnosis not present

## 2022-11-16 NOTE — Telephone Encounter (Signed)
Pt c/o Syncope: STAT if syncope occurred within 30 minutes and pt complains of lightheadedness High Priority if episode of passing out, completely, today or in last 24 hours   Did you pass out today? Episodes where she feels like she is going to pass out   When is the last time you passed out? never passed out   Has this occurred multiple times?    Did you have any symptoms prior to passing out?  Chest has been hurting off and on- not when she have the episodes daughter wanted patient to be seen- I made ana ppointment for tomorrow(11-17-22) with Dr Tomie China- please call to evaluate

## 2022-11-16 NOTE — Telephone Encounter (Signed)
Spoke with Deborah Hutchinson and advised per Dr. Kem Parkinson note to go to the ER for evaluation. Deborah Hutchinson stated that she felt dizzy and like she was going to pass out. Encouraged Deborah Hutchinson to go to ER so her heart could be monitored and see what is going on. She stated that her daughter is with her. Deborah Hutchinson verbalized understanding and stated that she would go to ER if she got worse.

## 2022-11-16 NOTE — Patient Instructions (Addendum)
_______________________________________________________  If your blood pressure at your visit was 140/90 or greater, please contact your primary care physician to follow up on this.  _______________________________________________________  If you are age 79 or older, your body mass index should be between 23-30. Your Body mass index is 29.79 kg/m. If this is out of the aforementioned range listed, please consider follow up with your Primary Care Provider.  If you are age 12 or younger, your body mass index should be between 19-25. Your Body mass index is 29.79 kg/m. If this is out of the aformentioned range listed, please consider follow up with your Primary Care Provider.   ________________________________________________________  The [facility] providers would like to encourage you to use [facility] to communicate with providers for non-urgent requests or questions.  Due to long hold times on the telephone, sending your provider a message by [facility] may be a faster and more efficient way to get a response.  Please allow 48 business hours for a response.  Please remember that this is for non-urgent requests.  _______________________________________________________  Your provider has requested that you have an abdominal x ray before leaving today. Please go to the basement floor to our Radiology department for the test.   Please call in 2 weeks to [facility] if you haven't heard from them Address is [address] [place], [place], [place] [address] Phone: [phone]  Decrease Protonix 40mg  to daily in the morning  Pepcid 20 mg continue at night  Continue IV iron as per hematology  Please call in October to schedule an appointment in January  Please call with any questions or concerns in the mean time.  Thank you,  Dr. Lynann Bologna

## 2022-11-16 NOTE — Progress Notes (Signed)
IMPRESSION and PLAN:    #1.  Abn CT AP 07/2022 showing ?bladder neoplasm. Neg PET 06/2022  #2. 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 S/P multiple iron infusoins.  Followed by hematology. Recent GI workup included push SBE 10/06/2022 showing 3 cm HH, duodenal AVMs s/p APC. Nl Px jejunum VCE 10/06/2022 was neg for any small bowel active bleeding. Neg colon 11/2020 for etiology of IDA Neg PET 06/2022.  Majority of the hyperplastic gastric polyps were removed on EGD 03/2021 with Dr. Meridee Score.   #3. Gastroparesis. Reglan stopped d/t tardive dyskinesia.  #4. Chronic abdo pain with bloating.  #5. GERD with large HH s/p repair Nov 2021, now with 3 cm residual/recurrent hernia on EGD.  #6. Peritoneal mesothelioma (low grade) at time of [facility] repair. Stable. Last CT chest/A/P March 2023 without progression. Being followed by Dr Lenis Noon. Neg PET 06/2022    Plan:  -X ray KUB 2V -UROLOGY appt. -FU appt with Dr Tomie China (?need for plavix, ?switch to bASA) -Decrease Protonix 40mg  po QAM -Pepcid 20 mg p.o. QHS -Continue IV iron as per hematology -RTC 6 months -D/W pt and daughter. -If any active bleeding, they will promptly get in touch with [place]. HPI:    Chief Complaint:   Deborah Hutchinson is a 77 y.o. female with CAD on plavix(off bASA), DM2, HH, OSA, COPD, LBP d/t OA, HTN, HLD. Accompanied by her daughter  With recurrent IDA w/t H+ stools (on plavix) s/p multiple IV iron infusions.  Most recently her Hb has been stable in the range of 11-11.2  Recent GI workup included SBE 10/06/2022 showing 3 cm HH, duodenal AVMs s/p APC.  VCE 10/06/2022 was negative for any small bowel active bleeding.  Most recent CT chest/Abdo/pelvis 07/2022 was negative for any GI abnormalities.  It did show ?Urinary bladder neoplasm.  Patient is awaiting urology appointment since then.  Stable mesothelioma.  From GI standpoint she is doing well except for chronic Abdo bloating with discomfort.   She has postprandial diarrhea but still complains of "incomplete evacuation" she feels like she has a "stool ball" sitting in the rectum.  No obvious melena or hematochezia.  She denies having any hematuria.     From previous notes: Most recent CT chest Abdo/pelvis March 2023 shows peritoneal mesothelioma to be quiescent.  Majority of the hyperplastic gastric polyps were removed on EGD 03/2021 with Dr. Meridee Score.  She had negative colonoscopy 12/2018 as below.  Had heartburn, postprandial abdominal bloating without nausea/vomiting.  No abdominal pain.  No diarrhea or constipation.  She denies having any weight loss.  Complains of constant burping.  Continues to be Plavix  Also has mild renal insufficiency.  With H+ stools, likely d/t Mod sized gastric polyps (hyperplastic) in setting of Plavix for CAD. Neg colon 11/2020  No pica to ice.  No over-the-counter nonsteroidals.  Unfortunately had a fall with right face hematoma.    Previous notes: S/P HH repair by Dr. Carolynn Sayers November 2021. Was found to have low-grade mesothelioma.  She is closely being followed by Dr. Lenis Noon.  No worsening. CT Abdo/pelvis March 2022, 07/2021 as below which did not show any progression.     Past GI procedures: CT C/A/P wih contrast 07/26/2022 1.  There is a centimeter enhancing focus at the right bladder which raises concern for a primary urothelial neoplasm. Consider cystoscopy for further evaluation- AWAITING urology consulatation  2.  Distal sigmoid diverticula with adjacent stranding concerning for diverticulitis. No evidence of  perforation or adjacent fluid collection.  3.  No convincing evidence of progressive peritoneal or diaphragmatic disease.  4.  Similar scattered groundglass opacities/nodules within the lungs and bilateral adrenal nodules.   PET 06/2022 IMPRESSION:  1. No tracer avid solid nodule or mass identified.  2. As noted previously there are multiple ground-glass nodules   identified within both lungs. These do not demonstrate any  appreciable tracer uptake. The largest ground-glass nodule is in the  periphery of the left upper lobe measuring 1.3 cm. Absent FDG uptake  does not exclude the possibility of underlying indolent neoplastic  process such as pulmonary adenocarcinoma and continued interval  surveillance of these nodules is strongly recommended.  3. Stable left adrenal adenoma.  4. Coronary artery calcifications.  5.  Aortic Atherosclerosis (ICD10-I70.0).   CT chest/AP 07/2021 1.  No significant change in appearance of thickening of the diaphragm status post paraesophageal hernia repair. No definite new peritoneal disease or diaphragmatic nodularity identified.  2.  Similar groundglass opacities/nodules throughout the lungs bilaterally.  3.  Similar bilateral adrenal nodules. Can consider MR imaging for further characterization as clinically indicated.  EGD 03/2021 (Dr Meridee Score) - No gross lesions in esophagus proximally. LA Grade A esophagitis with no bleeding distally (this is improved from prior). - Z-line irregular, 34 cm from the incisors. - 3 cm hiatal hernia. - Four gastric polyps. Resected and retrieved. Clips (MR conditional) were placed. - Gastritis. Biopsied. - No gross lesions in the duodenal bulb, in the first portion of the duodenum and in the second portion of the duodenum.   EGD 11/27/2020 - LA Grade C reflux esophagitis with no bleeding. Bx- neg - 3 cm hiatal hernia. S/P fundoplication. - 3 10-12 mm semi-sessile polyps with no bleeding and no stigmata of recent bleeding were found in the gastric body (1 at the diaphragmatic hiatus and the other 2 in proximal body of the stomach). Bx- Hyperplastic - Normal examined duodenum.  Colon 11/27/2020 - Seven 6 to 8 mm polyps in the mid descending colon, in the mid transverse colon, in the proximal ascending colon and in the mid ascending colon, removed with a cold snare. Resected  and retrieved. Bx- TAs - Diverticulosis in the sigmoid colon and in the ascending colon.  EGD 08/2019: Candida esophagitis, no stricture, 7 cm hiatal hernia.  Treated empirically for Candida esophagitis  EGD 12/14/2018: Large HH, hyperplastic gastric polyps, eso stricture s/p dil 50 Fr. EGD 08/17/2015 mod HH, neg SB Bx for celiac.  UGI series 04/2019: HH, GERD, no strictures.  Ba tab passed without any problems.  Colonoscopy 12/14/2018-colonic polyps s/p polypectomy, mild pancolonic div. Bx- TA.  CT AP with contrast 02/09/2018: Neg, Stable left adrenal adenoma, small right inguinal hernia.  GES 08/2019: delayed gastric emptying (12% emptied at hr 1, 29% at hr 2, 47% at hr 3, 64% an hr 4).  CT AP 07/2020 Impression: 1. Mildly thickened appearance of the hemidiaphragms without a discrete measurable mass, overall similar relative to the prior CT dated 01/08/2020 in this patient with biopsy-proven peritoneal mesothelioma involving the right hemidiaphragm. No definite new peritoneal nodules are identified. 2. No ascites. 3. Similar groundglass opacities/nodules throughout the lungs bilaterally, most conspicuous in the right lower lobe. These findings may be infectious, inflammatory, or neoplastic in etiology. 4. 4 mm solid right lower lobe pulmonary nodule, unchanged. The previously identified solid nodule in the right upper lobe is no longer seen. 5. Indeterminate bilateral adrenal lesions, measuring up to 1.7 cm on the left. Consider  recharacterization with MRI is recommended. 6. Multiple additional ancillary findings, as above.   ============================  PATHOLOGY: PATHOLOGY: Final Pathologic Diagnosis  A. "Frozen peritoneal nodule":        Mesothelioma, epithelioid type, low tumor grade, trabecular pattern.       See comment.   B. "Peritoneal nodule":       Mesothelioma, epithelioid type, low tumor grade, trabecular pattern.       See comment.     LHC 05/08/2019: Prox RCA lesion is 50%  stenosed. Prox LAD lesion is 40% stenosed. The left ventricular systolic function is normal. LV end diastolic pressure is normal. The left ventricular ejection fraction is greater than 65% by visual estimate. There is no mitral valve regurgitation. Past Medical History:  Diagnosis Date   Aftercare following surgery 03/26/2020   Allergy    Angina pectoris (HCC) 06/17/2020   Anxiety    Arthritis    Asthma    Bronchitis    CAD (coronary artery disease) 02/13/2019   Chest pain 05/08/2019   Chest pain of uncertain etiology    Chronic bilateral low back pain without sciatica 07/11/2019   Formatting of this note might be different from the original. Added automatically from request for surgery [id number]   Chronic obstructive pulmonary disease (HCC) 02/09/2017   Formatting of this note might be different from the original. Last Assessment & Plan:  I will review her PFTs after I obtain records from her pulmonologist. Based on this we will decide if she needs inhalers, subjectively they do not seem to have helped her at all. She may benefit from a pulmonary rehabilitation program Formatting of this note might be different from the original. Last Assessment    COPD (chronic obstructive pulmonary disease) (HCC) 02/09/2017   Coronary artery disease involving native coronary artery of native heart without angina pectoris 06/10/2015   Diabetes mellitus due to underlying condition with unspecified complications (HCC) 06/10/2015   Dyslipidemia 06/10/2015   Esophageal dysphagia 09/02/2019   Formatting of this note might be different from the original. Added automatically from request for surgery [id number]   Essential hypertension 06/10/2015   Gastroparesis 09/04/2019   GERD (gastroesophageal reflux disease)    H/O hiatal hernia    Headache 09/24/2018   Heart murmur    Hyperlipemia    Hypertension    Iron deficiency anemia 09/25/2020   Lumbar radicular pain 08/08/2022   Lumbar radiculopathy 12/18/2020    Mixed dyslipidemia 06/10/2015   Moderate aortic stenosis 05/16/2019   Nausea and vomiting 06/16/2020   Neurogenic claudication 12/18/2020   OSA (obstructive sleep apnea) 02/09/2017   Pain and swelling of toe of right foot 07/15/2016   Palpitations 06/02/2016   Paraesophageal hernia 10/30/2019   Formatting of this note might be different from the original. Added automatically from request for surgery [id number]   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   S/P lumbar fusion 03/26/2020   Shortness of breath    on  excertion   Sleep apnea     Current Outpatient Medications  Medication Sig Dispense Refill   albuterol (PROVENTIL HFA;VENTOLIN HFA) 108 (90 Base) MCG/ACT inhaler Inhale 2 puffs into the lungs every 6 (six) hours as needed for wheezing or shortness of breath. 3 Inhaler 3   amLODipine (NORVASC) 10 MG tablet Take 10 mg by mouth daily.     budesonide-formoterol (SYMBICORT) 160-4.5 MCG/ACT inhaler Inhale 2 puffs into the lungs 2 (two) times daily. 3 Inhaler 3   buPROPion (WELLBUTRIN XL) 300 MG 24 hr tablet Take  300 mg by mouth daily.      Cholecalciferol (VITAMIN D PO) Take 5,000 Units by mouth as directed. Every 2 weeks     clopidogrel (PLAVIX) 75 MG tablet Take 1 tablet (75 mg total) by mouth daily. 30 tablet 11   dicyclomine (BENTYL) 20 MG tablet Take 20 mg by mouth every 6 (six) hours as needed.            escitalopram (LEXAPRO) 10 MG tablet Take 10 mg by mouth daily.     famotidine (PEPCID) 40 MG tablet Take 1 tablet (40 mg total) by mouth at bedtime. 180 tablet 3   fluticasone (VERAMYST) 27.5 MCG/SPRAY nasal spray Place 2 sprays into the nose daily.     gabapentin (NEURONTIN) 300 MG capsule Take 300 mg by mouth 3 (three) times daily as needed.      hydrALAZINE (APRESOLINE) 50 MG tablet Take 1 tablet (50 mg total) by mouth 3 (three) times daily. 180 tablet 2   hydrochlorothiazide (HYDRODIURIL) 25 MG tablet Take 1 tablet (25 mg total) by mouth daily. 60 tablet 2   Lancets  (ACCU-CHEK MULTICLIX) lancets      linaclotide (LINZESS) 72 MCG capsule Take 72 mcg by mouth as needed.     losartan (COZAAR) 100 MG tablet Take 1 tablet (100 mg total) by mouth daily. 60 tablet 2   metFORMIN (GLUCOPHAGE) 500 MG tablet Take 500 mg by mouth 2 (two) times daily with a meal.      montelukast (SINGULAIR) 10 MG tablet Take 10 mg by mouth at bedtime.      Multiple Vitamin (MULTIVITAMIN) capsule Take 1 capsule by mouth daily.       oxybutynin (DITROPAN-XL) 10 MG 24 hr tablet Take 10 mg by mouth daily.     pantoprazole (PROTONIX) 40 MG tablet Take 1 tablet (40 mg total) by mouth daily. 60 tablet 2   Potassium Chloride CR (MICRO-K) 8 MEQ CPCR capsule CR TAKE 1 CAPSULE BY MOUTH ONCE DAILY FOR LOW POTASSIUM     simvastatin (ZOCOR) 20 MG tablet Take 20 mg by mouth at bedtime.     spironolactone (ALDACTONE) 25 MG tablet Take 1 tablet (25 mg total) by mouth daily. 60 tablet 2   traMADol (ULTRAM) 50 MG tablet Take 50 mg by mouth 2 (two) times daily as needed for pain.     VICTOZA 18 MG/3ML SOPN 12 mg daily.      BD PEN NEEDLE NANO U/F 32G X 4 MM MISC      isosorbide mononitrate (IMDUR) 120 MG 24 hr tablet Take 1 tablet (120 mg total) by mouth daily. (Patient not taking: Reported on 06/10/2019) 60 tablet 2   nitroGLYCERIN (NITROSTAT) 0.4 MG SL tablet DISSOLVE ONE TABLET UNDER THE TONGUE EVERY 5 MINUTES AS NEEDED FOR CHEST PAIN.  DO NOT EXCEED A TOTAL OF 3 DOSES IN 15 MINUTES (Patient not taking: Reported on 06/10/2019) 25 tablet 5   No current facility-administered medications for this visit.    Past Surgical History:  Procedure Laterality Date   ABDOMINAL HYSTERECTOMY     ABDOMINAL HYSTERECTOMY     BIOPSY  03/22/2021   Procedure: BIOPSY;  Surgeon: Meridee Score Netty Starring., MD;  Location: [facility];  Service: Gastroenterology;;   BREAST SURGERY     begin mass,left  breast   COLONOSCOPY  08/17/2015   Colonic polyp status post polypectomy. Mild pancolonic diverticulosis. Highly redundant  colon.    DILATION AND CURETTAGE OF UTERUS     one   ENDOSCOPIC  MUCOSAL RESECTION N/A 03/22/2021   Procedure: ENDOSCOPIC MUCOSAL RESECTION;  Surgeon: Meridee Score Netty Starring., MD;  Location: [facility];  Service: Gastroenterology;  Laterality: N/A;   ENTEROSCOPY N/A 10/06/2022   Procedure: ENTEROSCOPY;  Surgeon: Lynann Bologna, MD;  Location: [facility];  Service: Gastroenterology;  Laterality: N/A;   ESOPHAGOGASTRODUODENOSCOPY  08/17/2015   Moderate hiatal hernia. Otherwise noraml EGD.    ESOPHAGOGASTRODUODENOSCOPY  09/03/2019   [facility]   ESOPHAGOGASTRODUODENOSCOPY (EGD) WITH PROPOFOL N/A 03/22/2021   Procedure: ESOPHAGOGASTRODUODENOSCOPY (EGD) WITH PROPOFOL;  Surgeon: Meridee Score Netty Starring., MD;  Location: [facility];  Service: Gastroenterology;  Laterality: N/A;   EUS  05/19/2011   Procedure: UPPER ENDOSCOPIC ULTRASOUND (EUS) LINEAR;  Surgeon: Rob Bunting, MD;  Location: [facility];  Service: Endoscopy;  Laterality: N/A;  radial linear    GIVENS CAPSULE STUDY N/A 10/06/2022   Procedure: GIVENS CAPSULE STUDY;  Surgeon: Lynann Bologna, MD;  Location: [facility];  Service: Gastroenterology;  Laterality: N/A;   HEMOSTASIS CLIP PLACEMENT  03/22/2021   Procedure: HEMOSTASIS CLIP PLACEMENT;  Surgeon: Lemar Lofty., MD;  Location: [facility];  Service: Gastroenterology;;   HERNIA REPAIR  05/09/2020   HOT HEMOSTASIS N/A 10/06/2022   Procedure: HOT HEMOSTASIS (ARGON PLASMA COAGULATION/BICAP);  Surgeon: Lynann Bologna, MD;  Location: [facility];  Service: Gastroenterology;  Laterality: N/A;   HYSTEROTOMY     KNEE SURGERY     LEFT HEART CATH AND CORONARY ANGIOGRAPHY     LEFT HEART CATH AND CORONARY ANGIOGRAPHY N/A 05/08/2019   Procedure: LEFT HEART CATH AND CORONARY ANGIOGRAPHY;  Surgeon: Kathleene Hazel, MD;  Location: [facility];  Service: Cardiovascular;  Laterality: N/A;   right thumb     tendon repaire   RIGHT/LEFT HEART CATH AND  CORONARY ANGIOGRAPHY N/A 06/23/2020   Procedure: RIGHT/LEFT HEART CATH AND CORONARY ANGIOGRAPHY;  Surgeon: Swazil[place], Peter M, MD;  Location: [facility] INVASIVE CV LAB;  Service: Cardiovascular;  Laterality: N/A;   SHOULDER SURGERY     rotator cuff repair   SUBMUCOSAL LIFTING INJECTION  03/22/2021   Procedure: SUBMUCOSAL LIFTING INJECTION;  Surgeon: Lemar Lofty., MD;  Location: [facility];  Service: Gastroenterology;;   TRANSFORAMINAL LUMBAR INTERBODY FUSION L4-5   02/27/2020   WRIST SURGERY     rigth    Family History  Problem Relation Age of Onset   Diabetes Sister    Cancer Sister    Hypertension Daughter    Colon cancer Neg Hx    Liver disease Neg Hx    Pancreatic cancer Neg Hx    Esophageal cancer Neg Hx     Social History   Tobacco Use   Smoking status: Former    Packs/day: 1    Types: Cigarettes    Quit date: 05/17/2011    Years since quitting: 11.5   Smokeless tobacco: Never  Vaping Use   Vaping Use: Never used  Substance Use Topics   Alcohol use: No   Drug use: No    Allergies  Allergen Reactions   Penicillins Rash    Reaction was 30years ago  Has never taken again Reaction was 30years ago  Has never taken again Has patient had a PCN reaction causing anaphylaxis, immediate rash, facial/tongue/throat swelling, SOB or lightheadedness with hypotension? no Has patient had a PCN reaction causing severe rash involving mucus membranes or skin necrosis?  no Has patient had a PCN reaction that required hospitilization?no  Has patient had a PCN reaction occurring within the last 10 years?  no If  all of the above answers are "no" then may proceed with cephalosporin use   Ampicillin Hives   Tramadol Other (See Comments)    Hallunications       Review of Systems: All systems reviewed and negative except where noted in HPI.    Physical Exam:     BP 122/78   Pulse 66   Ht 5\' 5"  (1.651 m)   Wt 179 lb (81.2 kg)   BMI 29.79 kg/m   Gen: awake, alert,  NAD HEENT: anicteric, no pallor CV: RRR, no mrg Pulm: CTA b/l Abd: soft, NT/ND, +BS throughout Ext: no c/c/e Neuro: nonfocal     Latest Ref Rng & Units 10/17/2022   12:00 AM 08/24/2022    9:39 AM 07/15/2022   10:37 AM  CBC  WBC  6.2     5.0  7.6   Hemoglobin 12.0 - 16.0 11.2     11.2  11.3   Hematocrit 36 - 46 34     35.1  36.1   Platelets 150 - 400 K/uL 406     327.0  292.0      This result is from an external source.      Latest Ref Rng & Units 08/24/2022    9:39 AM 07/15/2022   10:37 AM 12/21/2021   12:00 AM  CMP  Glucose 70 - 99 mg/dL 89  92    BUN 6 - 23 mg/dL 15  14  12       Creatinine 0.40 - 1.20 mg/dL 1.61  0.96  1.2      Sodium 135 - 145 mEq/L 141  142  139      Potassium 3.5 - 5.1 mEq/L 3.8  4.0  3.1      Chloride 96 - 112 mEq/L 103  109  103      CO2 19 - 32 mEq/L 30  25  28       Calcium 8.4 - 10.5 mg/dL 9.5  9.6  9.5      Total Protein 6.0 - 8.3 g/dL 6.8  6.5    Total Bilirubin 0.2 - 1.2 mg/dL 0.2  0.3    Alkaline Phos 39 - 117 U/L 96  95  93      AST 0 - 37 U/L 24  23  35      ALT 0 - 35 U/L 34  32  37         This result is from an external source.       Kaydince Towles,MD 11/16/2022, 8:52 AM

## 2022-11-17 ENCOUNTER — Encounter: Payer: Self-pay | Admitting: Cardiology

## 2022-11-17 ENCOUNTER — Ambulatory Visit: Payer: Medicare HMO | Attending: Cardiology | Admitting: Cardiology

## 2022-11-17 ENCOUNTER — Telehealth: Payer: Self-pay

## 2022-11-17 ENCOUNTER — Ambulatory Visit: Payer: Medicare HMO | Attending: Cardiology

## 2022-11-17 ENCOUNTER — Telehealth (HOSPITAL_COMMUNITY): Payer: Self-pay | Admitting: *Deleted

## 2022-11-17 VITALS — BP 124/70 | HR 71 | Ht 65.0 in | Wt 180.0 lb

## 2022-11-17 DIAGNOSIS — R002 Palpitations: Secondary | ICD-10-CM

## 2022-11-17 DIAGNOSIS — E088 Diabetes mellitus due to underlying condition with unspecified complications: Secondary | ICD-10-CM

## 2022-11-17 DIAGNOSIS — G4733 Obstructive sleep apnea (adult) (pediatric): Secondary | ICD-10-CM

## 2022-11-17 DIAGNOSIS — I350 Nonrheumatic aortic (valve) stenosis: Secondary | ICD-10-CM | POA: Diagnosis not present

## 2022-11-17 DIAGNOSIS — I2510 Atherosclerotic heart disease of native coronary artery without angina pectoris: Secondary | ICD-10-CM

## 2022-11-17 DIAGNOSIS — I10 Essential (primary) hypertension: Secondary | ICD-10-CM

## 2022-11-17 DIAGNOSIS — E782 Mixed hyperlipidemia: Secondary | ICD-10-CM

## 2022-11-17 MED ORDER — LOSARTAN POTASSIUM-HCTZ 50-12.5 MG PO TABS: 1.0000 | ORAL_TABLET | Freq: Every day | ORAL | 3 refills | Status: DC

## 2022-11-17 MED ORDER — ASPIRIN 81 MG PO TBEC: 81.0000 mg | DELAYED_RELEASE_TABLET | Freq: Every day | ORAL | 3 refills | Status: DC

## 2022-11-17 NOTE — Patient Instructions (Signed)
Medication Instructions:  Your physician has recommended you make the following change in your medication:   STOP: Plavix START: Aspirin 81 mg daily START: Hyzaar 50/12.5 mg daily   *If you need a refill on your cardiac medications before your next appointment, please call your pharmacy*   Lab Work: None If you have labs (blood work) drawn today and your tests are completely normal, you will receive your results only by: MyChart Message (if you have MyChart) OR A paper copy in the mail If you have any lab test that is abnormal or we need to change your treatment, we will call you to review the results.   Testing/Procedures: A zio monitor was ordered today. It will remain on for 14 days. You will then return monitor and event diary in provided box. It takes 1-2 weeks for report to be downloaded and returned to [place]. We will call you with the results. If monitor falls off or has orange flashing light, please call Zio for further instructions.     [facility] [address] [place], [place] [address] Phone:  [phone]    Please arrive 15 minutes prior to your appointment time for registration and insurance purposes.  The test will take approximately 3 to 4 hours to complete; you may bring reading material.  If someone comes with you to your appointment, they will need to remain in the main lobby due to limited space in the testing area. **If you are pregnant or breastfeeding, please notify the nuclear lab prior to your appointment**  How to prepare for your Myocardial Perfusion Test: Do not eat or drink 3 hours prior to your test, except you may have water. Do not consume products containing caffeine (regular or decaffeinated) 12 hours prior to your test. (ex: coffee, chocolate, sodas, tea). Do bring a list of your current medications with you.  If not listed below, you may take your medications as normal. HOLD diabetic medication/insulin the morning of  the test: Invokana and Victoza  Do wear comfortable clothes (no dresses or overalls) and walking shoes, tennis shoes preferred (No heels or open toe shoes are allowed). Do NOT wear cologne, perfume, aftershave, or lotions (deodorant is allowed). If these instructions are not followed, your test will have to be rescheduled.  Please report to [address] for your test.  If you have questions or concerns about your appointment, you can call the [facility] at [phone].  If you cannot keep your appointment, please provide 24 hours notification to the Nuclear Lab, to avoid a possible $50 charge to your account.     Follow-Up: At [facility], you and your health needs are our priority.  As part of our continuing mission to provide you with exceptional heart care, we have created designated Provider Care Teams.  These Care Teams include your primary Cardiologist (physician) and Advanced Practice Providers (APPs -  Physician Assistants and Nurse Practitioners) who all work together to provide you with the care you need, when you need it.  We recommend signing up for the patient portal called "MyChart".  Sign up information is provided on this After Visit Summary.  MyChart is used to connect with patients for Virtual Visits (Telemedicine).  Patients are able to view lab/test results, encounter notes, upcoming appointments, etc.  Non-urgent messages can be sent to your provider as well.   To learn more about what you can do with MyChart, go to [email or web address].  Your next appointment:   6 month(s)  Provider:   Belva Crome, MD    Other Instructions None

## 2022-11-17 NOTE — Addendum Note (Signed)
Addended by: Roxanne Mins I on: 11/17/2022 09:58 AM   Modules accepted: Orders

## 2022-11-17 NOTE — Addendum Note (Signed)
Addended by: Roxanne Mins I on: 11/17/2022 10:04 AM   Modules accepted: Orders

## 2022-11-17 NOTE — Telephone Encounter (Signed)
Pt reached and given instructions for MPI study. 

## 2022-11-17 NOTE — Telephone Encounter (Signed)
Fax referral to [phone] with recent CT from [facility] demographics insurance cards asking for urgent appointment. 9 pages successful with referral form from [facility] urology

## 2022-11-17 NOTE — Progress Notes (Signed)
Cardiology Office Note:    Date:  11/17/2022   ID:  Deborah Hutchinson, DOB 1945/12/27, MRN [id number]  PCP:  Lucianne Lei, MD  Cardiologist:  Garwin Brothers, MD   Referring MD: Lucianne Lei, MD    ASSESSMENT:    1. Essential hypertension   2. Coronary artery disease involving native coronary artery of native heart without angina pectoris   3. Mild aortic stenosis   4. OSA (obstructive sleep apnea)   5. Diabetes mellitus due to underlying condition with unspecified complications (HCC)   6. Mixed dyslipidemia    PLAN:    In order of problems listed above:  Coronary artery disease: Secondary prevention stressed with the patient.  Importance of compliance with diet medication stressed and she vocalized understanding.  She complains of chest pain at times.  This is not related to exertion.  However in view of existing coronary artery disease we will do a Lexiscan sestamibi.  She is agreeable. Mild aortic stenosis: Stable at this time.  Echo report reviewed from January earlier this year. Postural hypotension: I reduced ARB medication and hydrochlorothiazide combination to half dose.  They are going to keep a track of her pulse and blood pressures.  Will get back to [place] about this. Palpitations and presyncopal like episodes: Will do a 2-week monitor to assess for any arrhythmias that may be causing this. According to the advice and suggestions of gastroenterology will change Plavix to 81 mg coated aspirin. Patient will be seen in follow-up appointment in 6 months or earlier if the patient has any concerns.    Medication Adjustments/Labs and Tests Ordered: Current medicines are reviewed at length with the patient today.  Concerns regarding medicines are outlined above.  Orders Placed This Encounter  Procedures   EKG 12-Lead   No orders of the defined types were placed in this encounter.    No chief complaint on file.    History of Present Illness:    Deborah Hutchinson  is a 77 y.o. female.  Patient has past medical history of nonobstructive coronary artery disease.  She had coronary angiography in 2022.  She has mild aortic stenosis, essential hypertension, mixed dyslipidemia.  She mentions to me that the she has symptoms which suggest postural hypotension.  Her daughter checks her blood pressures and finds them to be low when she stands up.  No orthopnea or PND no syncope.  She has felt that she may pass out.  At the time of my evaluation, the patient is alert awake oriented and in no distress.  Past Medical History:  Diagnosis Date   Aftercare following surgery 03/26/2020   Allergy    Angina pectoris (HCC) 06/17/2020   Anxiety    Arthritis    Asthma    Bronchitis    CAD (coronary artery disease) 02/13/2019   Chest pain 05/08/2019   Chest pain of uncertain etiology    Chronic bilateral low back pain without sciatica 07/11/2019   Formatting of this note might be different from the original. Added automatically from request for surgery [id number]   Chronic obstructive pulmonary disease (HCC) 02/09/2017   Formatting of this note might be different from the original. Last Assessment & Plan:  I will review her PFTs after I obtain records from her pulmonologist. Based on this we will decide if she needs inhalers, subjectively they do not seem to have helped her at all. She may benefit from a pulmonary rehabilitation program Formatting of this note might be different from  the original. Last Assessment    COPD (chronic obstructive pulmonary disease) (HCC) 02/09/2017   Coronary artery disease involving native coronary artery of native heart without angina pectoris 06/10/2015   Diabetes mellitus due to underlying condition with unspecified complications (HCC) 06/10/2015   Dyslipidemia 06/10/2015   Esophageal dysphagia 09/02/2019   Formatting of this note might be different from the original. Added automatically from request for surgery [id number]   Essential hypertension  06/10/2015   Gastroparesis 09/04/2019   GERD (gastroesophageal reflux disease)    H/O hiatal hernia    Headache 09/24/2018   Heart murmur    Hyperlipemia    Hypertension    Iron deficiency anemia 09/25/2020   Lumbar radicular pain 08/08/2022   Lumbar radiculopathy 12/18/2020   Mixed dyslipidemia 06/10/2015   Moderate aortic stenosis 05/16/2019   Nausea and vomiting 06/16/2020   Neurogenic claudication 12/18/2020   OSA (obstructive sleep apnea) 02/09/2017   Pain and swelling of toe of right foot 07/15/2016   Palpitations 06/02/2016   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   S/P lumbar fusion 03/26/2020   Shortness of breath    on  excertion   Sleep apnea     Past Surgical History:  Procedure Laterality Date   ABDOMINAL HYSTERECTOMY     ABDOMINAL HYSTERECTOMY     BIOPSY  03/22/2021   Procedure: BIOPSY;  Surgeon: Lemar Lofty., MD;  Location: [facility];  Service: Gastroenterology;;   BREAST SURGERY     begin mass,left  breast   COLONOSCOPY  08/17/2015   Colonic polyp status post polypectomy. Mild pancolonic diverticulosis. Highly redundant colon.    DILATION AND CURETTAGE OF UTERUS     one   ENDOSCOPIC MUCOSAL RESECTION N/A 03/22/2021   Procedure: ENDOSCOPIC MUCOSAL RESECTION;  Surgeon: Meridee Score Netty Starring., MD;  Location: [facility];  Service: Gastroenterology;  Laterality: N/A;   ENTEROSCOPY N/A 10/06/2022   Procedure: ENTEROSCOPY;  Surgeon: Lynann Bologna, MD;  Location: [facility];  Service: Gastroenterology;  Laterality: N/A;   ESOPHAGOGASTRODUODENOSCOPY  08/17/2015   Moderate hiatal hernia. Otherwise noraml EGD.    ESOPHAGOGASTRODUODENOSCOPY  09/03/2019   [facility]   ESOPHAGOGASTRODUODENOSCOPY (EGD) WITH PROPOFOL N/A 03/22/2021   Procedure: ESOPHAGOGASTRODUODENOSCOPY (EGD) WITH PROPOFOL;  Surgeon: Meridee Score Netty Starring., MD;  Location: [facility];  Service: Gastroenterology;  Laterality: N/A;   EUS  05/19/2011   Procedure:  UPPER ENDOSCOPIC ULTRASOUND (EUS) LINEAR;  Surgeon: Rob Bunting, MD;  Location: [facility];  Service: Endoscopy;  Laterality: N/A;  radial linear    GIVENS CAPSULE STUDY N/A 10/06/2022   Procedure: GIVENS CAPSULE STUDY;  Surgeon: Lynann Bologna, MD;  Location: [facility];  Service: Gastroenterology;  Laterality: N/A;   HEMOSTASIS CLIP PLACEMENT  03/22/2021   Procedure: HEMOSTASIS CLIP PLACEMENT;  Surgeon: Lemar Lofty., MD;  Location: [facility];  Service: Gastroenterology;;   HERNIA REPAIR  05/09/2020   HOT HEMOSTASIS N/A 10/06/2022   Procedure: HOT HEMOSTASIS (ARGON PLASMA COAGULATION/BICAP);  Surgeon: Lynann Bologna, MD;  Location: [facility];  Service: Gastroenterology;  Laterality: N/A;   HYSTEROTOMY     KNEE SURGERY     LEFT HEART CATH AND CORONARY ANGIOGRAPHY     LEFT HEART CATH AND CORONARY ANGIOGRAPHY N/A 05/08/2019   Procedure: LEFT HEART CATH AND CORONARY ANGIOGRAPHY;  Surgeon: Kathleene Hazel, MD;  Location: [facility];  Service: Cardiovascular;  Laterality: N/A;   right thumb     tendon repaire   RIGHT/LEFT HEART CATH AND CORONARY ANGIOGRAPHY N/A 06/23/2020   Procedure: RIGHT/LEFT  HEART CATH AND CORONARY ANGIOGRAPHY;  Surgeon: [place], Peter M, MD;  Location: [facility];  Service: Cardiovascular;  Laterality: N/A;   SHOULDER SURGERY     rotator cuff repair   SUBMUCOSAL LIFTING INJECTION  03/22/2021   Procedure: SUBMUCOSAL LIFTING INJECTION;  Surgeon: Lemar Lofty., MD;  Location: [facility];  Service: Gastroenterology;;   TRANSFORAMINAL LUMBAR INTERBODY FUSION L4-5   02/27/2020   WRIST SURGERY     rigth    Current Medications: Current Meds  Medication Sig   albuterol (PROVENTIL HFA;VENTOLIN HFA) 108 (90 Base) MCG/ACT inhaler Inhale 2 puffs into the lungs every 6 (six) hours as needed for wheezing or shortness of breath.   buPROPion (WELLBUTRIN XL) 300 MG 24 hr tablet Take 300 mg by mouth in the morning.   canagliflozin  (INVOKANA) 300 MG TABS tablet Take 300 mg by mouth in the morning.   Cholecalciferol (VITAMIN D3) 1.25 MG (50000 UT) CAPS Take 50,000 Units by mouth every Wednesday.   clopidogrel (PLAVIX) 75 MG tablet Take 1 tablet (75 mg total) by mouth daily.   diclofenac Sodium (VOLTAREN) 1 % GEL Apply 1 g topically as needed (Joint pain).   donepezil (ARICEPT) 10 MG tablet Take 10 mg by mouth daily at 12 noon.   EPINEPHrine 0.3 mg/0.3 mL IJ SOAJ injection Inject 0.3 mg into the muscle as needed for anaphylaxis.   escitalopram (LEXAPRO) 20 MG tablet Take 20 mg by mouth at bedtime.   famotidine (PEPCID) 40 MG tablet Take 1 tablet (40 mg total) by mouth 2 (two) times daily.   Ferrous Sulfate (IRON PO) Take 1 tablet by mouth in the morning.   fluconazole (DIFLUCAN) 150 MG tablet Take 150 mg by mouth once a week.   Fluticasone-Umeclidin-Vilant (TRELEGY ELLIPTA) 200-62.5-25 MCG/INH AEPB Inhale 2 puffs into the lungs daily.   hydrocortisone-pramoxine (PROCTOFOAM-HC) rectal foam Place 1 applicator rectally 2 (two) times daily as needed for hemorrhoids or anal itching.   hydrOXYzine (ATARAX) 25 MG tablet Take 25 mg by mouth 3 (three) times daily as needed for anxiety.   isosorbide mononitrate (IMDUR) 120 MG 24 hr tablet Take 1 tablet by mouth once daily   levocetirizine (XYZAL) 5 MG tablet TAKE 1 TABLET BY MOUTH ONCE DAILY IN THE EVENING   lidocaine (LIDODERM) 5 % Place 1 patch onto the skin daily as needed (pain.).   linaclotide (LINZESS) 72 MCG capsule Take 72 mcg by mouth daily as needed (pain).   liraglutide (VICTOZA) 18 MG/3ML SOPN Inject 1.8 mg into the skin in the morning.   losartan-hydrochlorothiazide (HYZAAR) 100-25 MG tablet Take 1 tablet by mouth in the morning.   montelukast (SINGULAIR) 10 MG tablet Take 10 mg by mouth at bedtime.    Multiple Vitamin (MULTIVITAMIN) capsule Take 1 capsule by mouth in the morning.  Women's Multivitamin Gummy   nitroGLYCERIN (NITROSTAT) 0.4 MG SL tablet Place 0.4 mg under  the tongue every 5 (five) minutes x 3 doses as needed for chest pain.   nystatin cream (MYCOSTATIN) Apply 1 Application topically 2 (two) times daily as needed (skin irritation/rash).   pantoprazole (PROTONIX) 40 MG tablet Take 1 tablet (40 mg total) by mouth every morning.   Potassium Chloride CR (MICRO-K) 8 MEQ CPCR capsule CR Take 1 capsule (8 mEq total) by mouth daily.   psyllium (REGULOID) 0.52 g capsule Take 0.52 g by mouth daily.   rosuvastatin (CRESTOR) 20 MG tablet Take 1 tablet (20 mg total) by mouth daily.   traZODone (DESYREL) 50  MG tablet Take 50 mg by mouth at bedtime.   vitamin B-12 (CYANOCOBALAMIN) 500 MCG tablet Take 500 mcg by mouth in the morning.     Allergies:   Penicillins, Ampicillin, and Tramadol   Social History   Socioeconomic History   Marital status: Single    Spouse name: Not on file   Number of children: 3   Years of education: Not on file   Highest education level: Not on file  Occupational History   Occupation: retired  Tobacco Use   Smoking status: Former    Current packs/day: 0.00    Types: Cigarettes    Quit date: 05/17/2011    Years since quitting: 11.5   Smokeless tobacco: Never  Vaping Use   Vaping status: Never Used  Substance and Sexual Activity   Alcohol use: No   Drug use: No   Sexual activity: Never  Other Topics Concern   Not on file  Social History Narrative   Right handed   Social Determinants of Health   Financial Resource Strain: Not on file  Food Insecurity: No Food Insecurity (12/07/2019)   Received from [facility] visits prior to 07/09/2022.   Hunger Vital Sign    Worried About Programme researcher, broadcasting/film/video in the Last Year: Never true    Ran Out of Food in the Last Year: Never true  Transportation Needs: Not on file  Physical Activity: Not on file  Stress: Not on file  Social Connections: Not on file     Family History: The patient's family history includes Cancer in her sister; Diabetes in her sister;  Hypertension in her daughter. There is no history of Colon cancer, Liver disease, Pancreatic cancer, or Esophageal cancer.  ROS:   Please see the history of present illness.    All other systems reviewed and are negative.  EKGs/Labs/Other Studies Reviewed:    The following studies were reviewed today: EKG reveals sinus rhythm left atrial enlargement nonspecific ST-T changes   Recent Labs: 08/24/2022: ALT 34; BUN 15; Creatinine, Ser 1.10; Potassium 3.8; Sodium 141 10/17/2022: Hemoglobin 11.2; Platelets 406  Recent Lipid Panel    Component Value Date/Time   CHOL 159 02/03/2020 0937   TRIG 65 02/03/2020 0937   HDL 82 02/03/2020 0937   CHOLHDL 1.9 02/03/2020 0937   LDLCALC 64 02/03/2020 0937    Physical Exam:    VS:  BP 124/70 (BP Location: Right Arm, Patient Position: Sitting, Cuff Size: Normal)   Pulse 71   Ht 5\' 5"  (1.651 m)   Wt 180 lb (81.6 kg)   SpO2 96%   BMI 29.95 kg/m     Wt Readings from Last 3 Encounters:  11/17/22 180 lb (81.6 kg)  11/16/22 179 lb (81.2 kg)  10/28/22 182 lb 1.9 oz (82.6 kg)     GEN: Patient is in no acute distress HEENT: Normal NECK: No JVD; No carotid bruits LYMPHATICS: No lymphadenopathy CARDIAC: Hear sounds regular, 2/6 systolic murmur at the apex. RESPIRATORY:  Clear to auscultation without rales, wheezing or rhonchi  ABDOMEN: Soft, non-tender, non-distended MUSCULOSKELETAL:  No edema; No deformity  SKIN: Warm and dry NEUROLOGIC:  Alert and oriented x 3 PSYCHIATRIC:  Normal affect   Signed, Garwin Brothers, MD  11/17/2022 8:56 AM    [facility]

## 2022-11-22 NOTE — Telephone Encounter (Signed)
Spoke to [place], Charity fundraiser and she said she will reach out to the patient and they do have referral there

## 2022-11-23 ENCOUNTER — Ambulatory Visit: Payer: Medicare HMO | Attending: Cardiology

## 2022-11-23 DIAGNOSIS — G4733 Obstructive sleep apnea (adult) (pediatric): Secondary | ICD-10-CM | POA: Diagnosis not present

## 2022-11-23 DIAGNOSIS — I350 Nonrheumatic aortic (valve) stenosis: Secondary | ICD-10-CM | POA: Diagnosis not present

## 2022-11-23 DIAGNOSIS — E782 Mixed hyperlipidemia: Secondary | ICD-10-CM

## 2022-11-23 DIAGNOSIS — I10 Essential (primary) hypertension: Secondary | ICD-10-CM | POA: Diagnosis not present

## 2022-11-23 DIAGNOSIS — J301 Allergic rhinitis due to pollen: Secondary | ICD-10-CM | POA: Diagnosis not present

## 2022-11-23 DIAGNOSIS — I2510 Atherosclerotic heart disease of native coronary artery without angina pectoris: Secondary | ICD-10-CM | POA: Diagnosis not present

## 2022-11-23 DIAGNOSIS — Z7985 Long-term (current) use of injectable non-insulin antidiabetic drugs: Secondary | ICD-10-CM | POA: Diagnosis not present

## 2022-11-23 DIAGNOSIS — E088 Diabetes mellitus due to underlying condition with unspecified complications: Secondary | ICD-10-CM

## 2022-11-23 LAB — MYOCARDIAL PERFUSION IMAGING
LV dias vol: 45 mL (ref 46–106)
LV sys vol: 10 mL
Nuc Stress EF: 77 %
Peak HR: 81 {beats}/min
Rest HR: 64 {beats}/min
Rest Nuclear Isotope Dose: 10.2 mCi
SDS: 7
SRS: 0
SSS: 7
Stress Nuclear Isotope Dose: 30.5 mCi
TID: 1.01

## 2022-11-23 MED ORDER — REGADENOSON 0.4 MG/5ML IV SOLN
0.4000 mg | Freq: Once | INTRAVENOUS | Status: AC
  Administered 2022-11-23: 0.4 mg via INTRAVENOUS

## 2022-11-23 MED ORDER — TECHNETIUM TC 99M TETROFOSMIN IV KIT
10.2000 | PACK | Freq: Once | INTRAVENOUS | Status: AC | PRN
  Administered 2022-11-23: 10.2 via INTRAVENOUS

## 2022-11-23 MED ORDER — TECHNETIUM TC 99M TETROFOSMIN IV KIT
30.5000 | PACK | Freq: Once | INTRAVENOUS | Status: AC | PRN
  Administered 2022-11-23: 30.5 via INTRAVENOUS

## 2022-11-24 ENCOUNTER — Telehealth: Payer: Self-pay

## 2022-11-24 DIAGNOSIS — M961 Postlaminectomy syndrome, not elsewhere classified: Secondary | ICD-10-CM | POA: Diagnosis not present

## 2022-11-24 DIAGNOSIS — I209 Angina pectoris, unspecified: Secondary | ICD-10-CM

## 2022-11-24 DIAGNOSIS — M48062 Spinal stenosis, lumbar region with neurogenic claudication: Secondary | ICD-10-CM | POA: Diagnosis not present

## 2022-11-24 DIAGNOSIS — I2510 Atherosclerotic heart disease of native coronary artery without angina pectoris: Secondary | ICD-10-CM

## 2022-11-24 DIAGNOSIS — E785 Hyperlipidemia, unspecified: Secondary | ICD-10-CM

## 2022-11-24 DIAGNOSIS — I2089 Other forms of angina pectoris: Secondary | ICD-10-CM

## 2022-11-24 MED ORDER — METOPROLOL TARTRATE 100 MG PO TABS
100.0000 mg | ORAL_TABLET | Freq: Once | ORAL | 0 refills | Status: DC
Start: 2022-11-24 — End: 2023-01-03

## 2022-11-24 NOTE — Addendum Note (Signed)
Addended by: Belva Crome R on: 11/24/2022 09:46 AM   Modules accepted: Orders

## 2022-11-24 NOTE — Telephone Encounter (Signed)
Spoke to [place] again and she said she will reach out to the doctor that reviews the chart and will see. They havent called the patient but referral is marked as urgent

## 2022-11-24 NOTE — Telephone Encounter (Signed)
-----   Message from Aundra Dubin Revankar sent at 11/23/2022  5:16 PM EDT ----- Stress test is abnormal.  Please bring her in for a Chem-7 liver lipid check.  Also I want CT FFR on her.  Aspirin and nitroglycerin.  Copy primary care Garwin Brothers, MD 11/23/2022 5:15 PM

## 2022-11-28 DIAGNOSIS — E1140 Type 2 diabetes mellitus with diabetic neuropathy, unspecified: Secondary | ICD-10-CM | POA: Diagnosis not present

## 2022-11-28 DIAGNOSIS — Z6827 Body mass index (BMI) 27.0-27.9, adult: Secondary | ICD-10-CM | POA: Diagnosis not present

## 2022-11-28 DIAGNOSIS — E785 Hyperlipidemia, unspecified: Secondary | ICD-10-CM | POA: Diagnosis not present

## 2022-11-28 DIAGNOSIS — M159 Polyosteoarthritis, unspecified: Secondary | ICD-10-CM | POA: Diagnosis not present

## 2022-11-28 DIAGNOSIS — I2510 Atherosclerotic heart disease of native coronary artery without angina pectoris: Secondary | ICD-10-CM | POA: Diagnosis not present

## 2022-11-28 DIAGNOSIS — K219 Gastro-esophageal reflux disease without esophagitis: Secondary | ICD-10-CM | POA: Diagnosis not present

## 2022-11-28 DIAGNOSIS — C451 Mesothelioma of peritoneum: Secondary | ICD-10-CM | POA: Diagnosis not present

## 2022-11-28 DIAGNOSIS — R413 Other amnesia: Secondary | ICD-10-CM | POA: Diagnosis not present

## 2022-11-28 DIAGNOSIS — I10 Essential (primary) hypertension: Secondary | ICD-10-CM | POA: Diagnosis not present

## 2022-11-30 ENCOUNTER — Telehealth (HOSPITAL_COMMUNITY): Payer: Self-pay | Admitting: *Deleted

## 2022-11-30 NOTE — Telephone Encounter (Signed)
Inbound call from patient stating she still has not heard from office regarding referral. Please advise, thank you.

## 2022-11-30 NOTE — Telephone Encounter (Signed)
August 9th at 1130am per scheduler at [facility] urology. They spoke to the patient. Patient made aware and spoke to daughter and she has the right date and was told to be there at 1115am  and she is aware as well and have the address too and

## 2022-11-30 NOTE — Telephone Encounter (Signed)
Reaching out to patient to offer assistance regarding upcoming cardiac imaging study; pt verbalizes understanding of appt date/time, parking situation and where to check in, pre-test NPO status and medications ordered, and verified current allergies; name and call back number provided for further questions should they arise  Merle Prescott RN Navigator Cardiac Imaging [facility] [phone] office [phone] cell  Patient to take 100mg metoprolol tartrate two hours prior to her cardiac CT scan. She is aware to arrive at 3pm. 

## 2022-12-01 ENCOUNTER — Telehealth: Payer: Self-pay | Admitting: Gastroenterology

## 2022-12-01 ENCOUNTER — Ambulatory Visit (HOSPITAL_COMMUNITY)
Admission: RE | Admit: 2022-12-01 | Discharge: 2022-12-01 | Disposition: A | Discharge status: Home / Self Care | Payer: Medicare HMO | Source: Ambulatory Visit | Attending: Cardiology | Admitting: Cardiology

## 2022-12-01 DIAGNOSIS — I25118 Atherosclerotic heart disease of native coronary artery with other forms of angina pectoris: Secondary | ICD-10-CM

## 2022-12-01 DIAGNOSIS — I2089 Other forms of angina pectoris: Secondary | ICD-10-CM | POA: Diagnosis not present

## 2022-12-01 MED ORDER — NITROGLYCERIN 0.4 MG SL SUBL
SUBLINGUAL_TABLET | SUBLINGUAL | Status: AC
  Filled 2022-12-01: qty 2

## 2022-12-01 MED ORDER — IOHEXOL 350 MG/ML SOLN
95.0000 mL | Freq: Once | INTRAVENOUS | Status: AC | PRN
  Administered 2022-12-01: 95 mL via INTRAVENOUS

## 2022-12-01 MED ORDER — NITROGLYCERIN 0.4 MG SL SUBL
0.8000 mg | SUBLINGUAL_TABLET | SUBLINGUAL | Status: DC | PRN
  Administered 2022-12-01: 0.8 mg via SUBLINGUAL

## 2022-12-01 NOTE — Telephone Encounter (Signed)
Left message for patient to call back  

## 2022-12-01 NOTE — Telephone Encounter (Signed)
PT advised me that her medications had been changed recently with the famotidine and it is giving her really bad heartburn. Requesting call  back

## 2022-12-02 NOTE — Telephone Encounter (Signed)
Patient called in to let us know that since OV she has been experiencing heartburn since decreasing dose of pantoprazole & famotidine. She's taking each medication only once a day, where as before she was taking it BID. Pt would like to know if Dr. Chales Abrahams would like her to resume original dose.

## 2022-12-02 NOTE — Telephone Encounter (Signed)
Spoke with patient regarding MD recommendations. Pt verbalized all understanding. 

## 2022-12-02 NOTE — Telephone Encounter (Signed)
lets resume Protonix 40 mg p.o. twice daily ( to original dose) and  famotidine 20 mg p.o. nightly RG

## 2022-12-05 ENCOUNTER — Ambulatory Visit (HOSPITAL_COMMUNITY)
Admission: RE | Admit: 2022-12-05 | Discharge: 2022-12-05 | Disposition: A | Discharge status: Home / Self Care | Payer: Medicare HMO | Source: Ambulatory Visit | Attending: Cardiology | Admitting: Cardiology

## 2022-12-05 ENCOUNTER — Other Ambulatory Visit: Payer: Self-pay | Admitting: Cardiology

## 2022-12-05 DIAGNOSIS — R931 Abnormal findings on diagnostic imaging of heart and coronary circulation: Secondary | ICD-10-CM | POA: Diagnosis not present

## 2022-12-07 DIAGNOSIS — I350 Nonrheumatic aortic (valve) stenosis: Secondary | ICD-10-CM | POA: Diagnosis not present

## 2022-12-07 DIAGNOSIS — I10 Essential (primary) hypertension: Secondary | ICD-10-CM | POA: Diagnosis not present

## 2022-12-09 DIAGNOSIS — J301 Allergic rhinitis due to pollen: Secondary | ICD-10-CM | POA: Diagnosis not present

## 2022-12-09 DIAGNOSIS — M48061 Spinal stenosis, lumbar region without neurogenic claudication: Secondary | ICD-10-CM | POA: Diagnosis not present

## 2022-12-09 DIAGNOSIS — M545 Low back pain, unspecified: Secondary | ICD-10-CM | POA: Diagnosis not present

## 2022-12-09 DIAGNOSIS — M4316 Spondylolisthesis, lumbar region: Secondary | ICD-10-CM | POA: Diagnosis not present

## 2022-12-09 DIAGNOSIS — M5136 Other intervertebral disc degeneration, lumbar region: Secondary | ICD-10-CM | POA: Diagnosis not present

## 2022-12-09 DIAGNOSIS — M961 Postlaminectomy syndrome, not elsewhere classified: Secondary | ICD-10-CM | POA: Diagnosis not present

## 2022-12-12 NOTE — Telephone Encounter (Signed)
Inbound call from patient to discuss previous note. Please advise.   Thank you

## 2022-12-12 NOTE — Telephone Encounter (Signed)
Pt stated that she was just returning a call. Pt stated that someone had left her a message to call back. Pt chart was reviewed. Nothing noted that our office tried to reach pt.  Pt stated that it may just have been an old message. Pt verbalized understanding with all questions answered.

## 2022-12-14 DIAGNOSIS — I10 Essential (primary) hypertension: Secondary | ICD-10-CM | POA: Diagnosis not present

## 2022-12-14 DIAGNOSIS — J309 Allergic rhinitis, unspecified: Secondary | ICD-10-CM | POA: Diagnosis not present

## 2022-12-14 DIAGNOSIS — C451 Mesothelioma of peritoneum: Secondary | ICD-10-CM | POA: Diagnosis not present

## 2022-12-14 DIAGNOSIS — R413 Other amnesia: Secondary | ICD-10-CM | POA: Diagnosis not present

## 2022-12-14 DIAGNOSIS — M159 Polyosteoarthritis, unspecified: Secondary | ICD-10-CM | POA: Diagnosis not present

## 2022-12-14 DIAGNOSIS — E1140 Type 2 diabetes mellitus with diabetic neuropathy, unspecified: Secondary | ICD-10-CM | POA: Diagnosis not present

## 2022-12-14 DIAGNOSIS — I2510 Atherosclerotic heart disease of native coronary artery without angina pectoris: Secondary | ICD-10-CM | POA: Diagnosis not present

## 2022-12-14 DIAGNOSIS — K219 Gastro-esophageal reflux disease without esophagitis: Secondary | ICD-10-CM | POA: Diagnosis not present

## 2022-12-14 DIAGNOSIS — E785 Hyperlipidemia, unspecified: Secondary | ICD-10-CM | POA: Diagnosis not present

## 2022-12-15 NOTE — Telephone Encounter (Signed)
-----   Message from Garwin Brothers sent at 12/15/2022 12:31 PM EDT ----- Patient has coronary artery disease nonobstructive in nature.  Please bring her in for a Chem-7 liver lipid check.  Sublingual nitroglycerin prescription to be sent.  Very important to note that she has abnormal CT with possibility of cancer.  I am not sure whether she knows about this.  Please call her physicians nurse and discussed this at length and send the report to them and tell the patient also to call primary care and make an appointment to discuss this.  Copy primary care Garwin Brothers, MD 12/15/2022 12:30 PM

## 2022-12-16 DIAGNOSIS — Z79899 Other long term (current) drug therapy: Secondary | ICD-10-CM | POA: Diagnosis not present

## 2022-12-16 DIAGNOSIS — E559 Vitamin D deficiency, unspecified: Secondary | ICD-10-CM | POA: Diagnosis not present

## 2022-12-16 DIAGNOSIS — E1140 Type 2 diabetes mellitus with diabetic neuropathy, unspecified: Secondary | ICD-10-CM | POA: Diagnosis not present

## 2022-12-16 DIAGNOSIS — D414 Neoplasm of uncertain behavior of bladder: Secondary | ICD-10-CM | POA: Diagnosis not present

## 2022-12-16 DIAGNOSIS — E785 Hyperlipidemia, unspecified: Secondary | ICD-10-CM | POA: Diagnosis not present

## 2022-12-22 DIAGNOSIS — E1140 Type 2 diabetes mellitus with diabetic neuropathy, unspecified: Secondary | ICD-10-CM | POA: Diagnosis not present

## 2022-12-22 DIAGNOSIS — Z79899 Other long term (current) drug therapy: Secondary | ICD-10-CM | POA: Diagnosis not present

## 2022-12-23 ENCOUNTER — Other Ambulatory Visit: Payer: Self-pay | Admitting: Urology

## 2022-12-23 DIAGNOSIS — I2510 Atherosclerotic heart disease of native coronary artery without angina pectoris: Secondary | ICD-10-CM | POA: Diagnosis not present

## 2022-12-23 DIAGNOSIS — E785 Hyperlipidemia, unspecified: Secondary | ICD-10-CM | POA: Diagnosis not present

## 2022-12-24 LAB — LIPID PANEL: HDL: 87 mg/dL (ref >39)

## 2022-12-24 LAB — COMPREHENSIVE METABOLIC PANEL: BUN/Creatinine Ratio: 13 (ref 12–28)

## 2022-12-26 ENCOUNTER — Telehealth: Payer: Self-pay

## 2022-12-26 NOTE — Telephone Encounter (Signed)
   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 Rhoderick Moody Surgeon's Group or Practice Name:  [facility] Phone number:  [phone] Fax number:  [phone]   Type of Clearance Requested:   - Pharmacy:  Hold Aspirin and Clopidogrel (Plavix) please advise   Type of Anesthesia:  Not Indicated   Additional requests/questions:    Merlene Laughter   12/26/2022, 11:07 AM

## 2022-12-26 NOTE — Telephone Encounter (Signed)
     Primary Cardiologist: Garwin Brothers, MD  Chart reviewed as part of pre-operative protocol coverage. Given past medical history and time since last visit, based on ACC/AHA guidelines, Kimoni Harth would be at acceptable risk for the planned procedure without further cardiovascular testing.   His aspirin may be held for 5 to 7 days prior to his procedure.  He is not on clopidogrel.  Please resume his aspirin as soon as hemostasis is achieved.  His RCRI is a class III risk, 6.6% risk of major cardiac event.  I will route this recommendation to the requesting party via Epic fax function and remove from pre-op pool.  Please call with questions.  Thomasene Ripple. [place] NP-C     12/26/2022, 11:12 AM [facility] [address] Office [phone] Fax [phone]

## 2022-12-28 DIAGNOSIS — E113293 Type 2 diabetes mellitus with mild nonproliferative diabetic retinopathy without macular edema, bilateral: Secondary | ICD-10-CM | POA: Diagnosis not present

## 2022-12-28 DIAGNOSIS — H18413 Arcus senilis, bilateral: Secondary | ICD-10-CM | POA: Diagnosis not present

## 2022-12-28 DIAGNOSIS — H43819 Vitreous degeneration, unspecified eye: Secondary | ICD-10-CM | POA: Diagnosis not present

## 2022-12-28 DIAGNOSIS — Z961 Presence of intraocular lens: Secondary | ICD-10-CM | POA: Diagnosis not present

## 2022-12-28 DIAGNOSIS — H524 Presbyopia: Secondary | ICD-10-CM | POA: Diagnosis not present

## 2022-12-28 DIAGNOSIS — H532 Diplopia: Secondary | ICD-10-CM | POA: Diagnosis not present

## 2022-12-28 DIAGNOSIS — H52223 Regular astigmatism, bilateral: Secondary | ICD-10-CM | POA: Diagnosis not present

## 2022-12-29 ENCOUNTER — Encounter (HOSPITAL_BASED_OUTPATIENT_CLINIC_OR_DEPARTMENT_OTHER): Payer: Self-pay | Admitting: Urology

## 2022-12-30 ENCOUNTER — Other Ambulatory Visit: Payer: Self-pay

## 2022-12-30 ENCOUNTER — Encounter (HOSPITAL_BASED_OUTPATIENT_CLINIC_OR_DEPARTMENT_OTHER): Payer: Self-pay | Admitting: Urology

## 2022-12-30 ENCOUNTER — Encounter (HOSPITAL_COMMUNITY): Payer: Self-pay

## 2022-12-30 NOTE — Progress Notes (Addendum)
Patient was scheduled for surgery at [facility] on 01/11/2023. Patient has multiple health problems including HTN, HLD. DM2, OSA, chest pain w/ recent nitroglycerin use, SOB w/ any exertion, COPD, mesothelioma of diaphragm, bladder cancer, possible lung cancer, chronic cough and GERD. I reviewed this case with Dr. Verdia Kuba, MDA. Per Dr. Charlynn Grimes, this case should not be done at [facility]. Per Dr. Charlynn Grimes, patient needs to follow up with her cardiologist and get clearance. In 11/2022 patient had a positive stress test and elevated coronary calcium (both in Epic). I called Dr. Alphonsa Overall office and spoke with Shanda Bumps. I let her know the above information.

## 2023-01-02 NOTE — Progress Notes (Unsigned)
NEUROLOGY FOLLOW UP OFFICE NOTE  Tangina Lordi [id number]  Assessment/Plan:   Post-traumatic headache.   Tremor  ***  Total time spent face to face with patient and reviewing notes:  30 minutes  Subjective:  KAROLYN BRAGDON is a 77 year old female with iron-deficiency anemia, DM II, CAD, mesothelioma, orthostatic hypotension, and HTN who follows up for recent head injury.  She is accompanied by her daughter who supplements history.  UPDATE: Ophthalmology and ENT?  ***    HISTORY: She started noticing tremors in the hands in early 2023.  When she is not paying attention, her hands shake, right worse than left.  It doesn't occur with action, only at rest.  No changes in gait, balance, vision, swallowing.  Her daughter thinks it is getting worse. No new medications started just prior to onset of symptoms.  No know family history of tremor.    Other history: She began having headaches in 2017-2018.  They only occur when she has episodes of dyspnea.  She suddenly feels short of breath and lightheaded but doesn't pass out.  It is not exertional and happens at rest.  It does not occur out of sleep.  She has asthma and chronic bronchitis.  When these episodes occur, she develops 10/10 right sided stabbing headache.  She has associated weakness but denies associated neck pain, nausea, vomiting, photophobia, phonophobia, visual disturbance or unilateral numbness or weakness.  It typically lasts 5 minutes, about as long as the shortness of breath until she uses her inhaler.  Frequency varies.  She may have a week without episodes or twice a week or some weeks it may occur several times.  She also has longstanding history of tinnitus, which she describes as sounding like "bees" in her head.  She was evaluated by ENT in April 2019.  Hearing test and tympanogram were unremarkable.  When she gets these headaches, the ringing sounds louder.  She denies prior history of headaches.  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  No aneurysm.  Due to the severe right cavernous carotid stenosis,, ASA was switched to Plavix, which was first discussed with her cardiologist, Dr. Tomie China.   Posttraumatic headache: On 07/13/2022, she fell striking the right side of her face and sustained a right orbital fracture.  She went to [facility] and had a head scan (probably CT scan) which did not reveal an acute intracranial abnormality.  She has had a headache ever since.  It is a persistent right frontal throbbing headache.  When she wakes up in the morning, she has horizontal double vision for 20 minutes.  It hurts moving her right eye up and down.  Overall that has been improving.  However, headaches are getting more severe.  No nausea, vomiting, dizziness, photophobia.  She has been taking oxycodone and naproxen daily.  She finished the oxycodone but continues on the naproxen.    Past medications:  pregablin (hallucinations)  PAST MEDICAL HISTORY: Past Medical History:  Diagnosis Date   Angina pectoris (HCC) 06/17/2020   Anxiety    Arthritis    all over   Asthma    Follows w/ Dr. Britt Bottom, pulmonologist.   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   Chronic kidney disease    Chronic obstructive pulmonary disease (HCC) 02/09/2017   Formatting of this note might be different from the original. Last Assessment & Plan:  I will review her PFTs after I obtain records from her pulmonologist. Based on this we will decide if she needs inhalers, subjectively they do not seem to have helped her at all. She may  benefit from a pulmonary rehabilitation program Formatting of this note might be different from the original. Last Assessment    COPD (chronic obstructive pulmonary disease) (HCC) 02/09/2017   Coronary artery disease involving native coronary artery of native heart without angina pectoris 06/10/2015   Depression    Diabetes mellitus due to underlying condition with unspecified complications (HCC) 06/10/2015   Dyslipidemia 06/10/2015   Essential hypertension 06/10/2015   Gastroparesis 09/04/2019   GERD (gastroesophageal reflux disease)    H/O hiatal hernia    Headache 09/24/2018   Heart murmur    Hyperlipemia    Hypertension    Iron deficiency anemia 09/25/2020   Lumbar radicular pain 08/08/2022   Lumbar radiculopathy 12/18/2020   Mixed dyslipidemia 06/10/2015   Moderate aortic stenosis 05/16/2019   Nausea and vomiting 06/16/2020   occasional   Neurogenic claudication 12/18/2020   OSA (obstructive sleep apnea) 02/09/2017   Pain and swelling of toe of right foot 07/15/2016   Palpitations 06/02/2016   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   S/P lumbar fusion 03/26/2020   Shortness of breath    on  excertion   Sleep apnea     MEDICATIONS: Current Outpatient Medications on File Prior to Visit  Medication Sig Dispense Refill   albuterol (PROVENTIL HFA;VENTOLIN HFA) 108 (90 Base) MCG/ACT inhaler Inhale 2 puffs into the lungs every 6 (six) hours as needed for wheezing or shortness of breath. 3 Inhaler 3   aspirin EC 81 MG tablet Take 1 tablet (81 mg total) by mouth daily. Swallow whole. 90 tablet 3   budesonide-formoterol (SYMBICORT) 80-4.5 MCG/ACT inhaler Inhale 2 puffs into the lungs 2 (two) times daily.     buPROPion (WELLBUTRIN XL) 300 MG 24 hr tablet Take 300 mg by mouth in the morning.     canagliflozin (INVOKANA) 300 MG TABS tablet Take 300 mg by mouth in the morning.     Cholecalciferol (VITAMIN D3) 1.25 MG (50000 UT) CAPS Take 50,000 Units by mouth every Wednesday.      diclofenac Sodium (VOLTAREN) 1 % GEL Apply 1 g topically as needed (Joint pain).     donepezil (ARICEPT) 10 MG tablet Take 10 mg by mouth daily at 12 noon.     EPINEPHrine 0.3 mg/0.3 mL IJ SOAJ injection Inject 0.3 mg into the muscle as needed for anaphylaxis.     escitalopram (LEXAPRO) 20 MG tablet Take 20 mg by mouth at bedtime.     famotidine (PEPCID) 40 MG tablet Take 1 tablet (40 mg total) by mouth 2 (two) times daily. 60 tablet 11   Ferrous Sulfate (IRON PO) Take 1 tablet by mouth in the morning.     fluconazole (DIFLUCAN) 150 MG tablet Take 150 mg by mouth once a week. Daughter is not sure if patient is still taking this.     Fluticasone-Umeclidin-Vilant (TRELEGY ELLIPTA) 200-62.5-25 MCG/INH AEPB Inhale 2 puffs into the lungs daily.     hydrocortisone-pramoxine (PROCTOFOAM-HC) rectal foam Place 1 applicator rectally 2 (two)  times daily as needed for hemorrhoids or anal itching.     hydrOXYzine (ATARAX) 25 MG tablet Take 25 mg by mouth 3 (three) times daily as needed for anxiety. (Patient not taking: Reported on 12/30/2022)     isosorbide mononitrate (IMDUR) 120 MG 24 hr tablet Take 1 tablet by mouth once daily 60 tablet 6   levocetirizine (XYZAL) 5 MG tablet TAKE 1 TABLET BY MOUTH ONCE DAILY IN THE EVENING 30 tablet 0   lidocaine (LIDODERM) 5 % Place 1 patch onto the skin daily as needed (pain.).     linaclotide (LINZESS) 72 MCG capsule Take 72 mcg by mouth daily as needed (pain).     liraglutide (VICTOZA) 18 MG/3ML SOPN Inject 1.8 mg into the skin in the morning.     losartan (COZAAR) 25 MG tablet Take 25 mg by mouth daily.     losartan-hydrochlorothiazide (HYZAAR) 50-12.5 MG tablet Take 1 tablet by mouth daily. (Patient not taking: Reported on 12/30/2022) 90 tablet 3   metoprolol tartrate (LOPRESSOR) 100 MG tablet Take 1 tablet (100 mg total) by mouth once for 1 dose. Take 2 hours prior to your CT if your heart rate is greater than 55 1 tablet 0   montelukast (SINGULAIR) 10 MG tablet Take 10  mg by mouth at bedtime.      Multiple Vitamin (MULTIVITAMIN) capsule Take 1 capsule by mouth in the morning.  Women's Multivitamin Gummy     nitroGLYCERIN (NITROSTAT) 0.4 MG SL tablet Place 0.4 mg under the tongue every 5 (five) minutes x 3 doses as needed for chest pain.     nystatin cream (MYCOSTATIN) Apply 1 Application topically 2 (two) times daily as needed (skin irritation/rash).     pantoprazole (PROTONIX) 40 MG tablet Take 1 tablet (40 mg total) by mouth every morning. 90 tablet 4   Potassium Chloride CR (MICRO-K) 8 MEQ CPCR capsule CR Take 1 capsule (8 mEq total) by mouth daily. 90 capsule 1   psyllium (REGULOID) 0.52 g capsule Take 0.52 g by mouth daily.     rosuvastatin (CRESTOR) 20 MG tablet Take 1 tablet (20 mg total) by mouth daily. (Patient taking differently: Take 20 mg by mouth at bedtime.) 90 tablet 3   traZODone (DESYREL) 50 MG tablet Take 50 mg by mouth at bedtime.     vitamin B-12 (CYANOCOBALAMIN) 500 MCG tablet Take 500 mcg by mouth in the morning.     No current facility-administered medications on file prior to visit.    ALLERGIES: Allergies  Allergen Reactions   Penicillins Rash    Reaction was 30years ago  Has never taken again Reaction was 30years ago  Has never taken again Has patient had a PCN reaction causing anaphylaxis, immediate rash, facial/tongue/throat swelling, SOB or lightheadedness with hypotension? no Has patient had a PCN reaction causing severe rash involving mucus membranes or skin necrosis?  no Has patient had a PCN reaction that required hospitilization?no  Has patient had a PCN reaction occurring within the last 10 years?  no If all of the above answers are "no" then may proceed with cephalosporin use   Ampicillin Hives   Gabapentin Other (See Comments)     Hallucinations     FAMILY HISTORY: Family History  Problem Relation Age of Onset   Diabetes Sister    Cancer Sister    Hypertension Daughter    Colon cancer Neg Hx    Liver  disease Neg Hx    Pancreatic cancer Neg Hx    Esophageal  cancer Neg Hx       Objective:  *** General: No acute distress.  Patient appears well-groomed.   Head:  Normocephalic/atraumatic Eyes:  Fundi examined but not visualized Neck: supple, no paraspinal tenderness, full range of motion Heart:  Regular rate and rhythm Neurological Exam: ***    Shon Millet, DO  CC: Lucianne Lei, MD

## 2023-01-03 ENCOUNTER — Encounter: Payer: Self-pay | Admitting: Neurology

## 2023-01-03 ENCOUNTER — Ambulatory Visit: Payer: Medicare HMO | Admitting: Neurology

## 2023-01-03 VITALS — BP 132/81 | HR 72 | Ht 65.0 in | Wt 183.0 lb

## 2023-01-03 DIAGNOSIS — D414 Neoplasm of uncertain behavior of bladder: Secondary | ICD-10-CM | POA: Diagnosis not present

## 2023-01-03 DIAGNOSIS — H532 Diplopia: Secondary | ICD-10-CM

## 2023-01-03 DIAGNOSIS — S0990XD Unspecified injury of head, subsequent encounter: Secondary | ICD-10-CM | POA: Diagnosis not present

## 2023-01-03 MED ORDER — DIAZEPAM 5 MG PO TABS: ORAL_TABLET | ORAL | 0 refills | Status: DC

## 2023-01-03 NOTE — Patient Instructions (Addendum)
Check MRI of brain with and without contrast.  Take diazepam 30-40 minutes prior to test.  Must have a driver. Refer to ophthalmology Further recommendations pending results. Follow up after testing

## 2023-01-06 DIAGNOSIS — R059 Cough, unspecified: Secondary | ICD-10-CM | POA: Diagnosis not present

## 2023-01-06 DIAGNOSIS — R0981 Nasal congestion: Secondary | ICD-10-CM | POA: Diagnosis not present

## 2023-01-06 DIAGNOSIS — R509 Fever, unspecified: Secondary | ICD-10-CM | POA: Diagnosis not present

## 2023-01-06 DIAGNOSIS — J019 Acute sinusitis, unspecified: Secondary | ICD-10-CM | POA: Diagnosis not present

## 2023-01-06 DIAGNOSIS — J111 Influenza due to unidentified influenza virus with other respiratory manifestations: Secondary | ICD-10-CM | POA: Diagnosis not present

## 2023-01-06 DIAGNOSIS — R051 Acute cough: Secondary | ICD-10-CM | POA: Diagnosis not present

## 2023-01-06 DIAGNOSIS — Z6827 Body mass index (BMI) 27.0-27.9, adult: Secondary | ICD-10-CM | POA: Diagnosis not present

## 2023-01-11 ENCOUNTER — Ambulatory Visit (HOSPITAL_BASED_OUTPATIENT_CLINIC_OR_DEPARTMENT_OTHER): Admission: RE | Admit: 2023-01-11 | Payer: Medicare HMO | Source: Home / Self Care | Admitting: Urology

## 2023-01-11 HISTORY — DX: Depression, unspecified: F32.A

## 2023-01-11 HISTORY — DX: Malignant (primary) neoplasm, unspecified: C80.1

## 2023-01-11 HISTORY — DX: Chronic kidney disease, unspecified: N18.9

## 2023-01-11 SURGERY — TRANSURETHRAL RESECTION OF BLADDER TUMOR (TURBT) with GEMCITABINE
Anesthesia: General

## 2023-01-13 DIAGNOSIS — H524 Presbyopia: Secondary | ICD-10-CM | POA: Diagnosis not present

## 2023-01-13 DIAGNOSIS — J301 Allergic rhinitis due to pollen: Secondary | ICD-10-CM | POA: Diagnosis not present

## 2023-01-13 DIAGNOSIS — R7989 Other specified abnormal findings of blood chemistry: Secondary | ICD-10-CM | POA: Diagnosis not present

## 2023-01-18 DIAGNOSIS — Z8616 Personal history of COVID-19: Secondary | ICD-10-CM | POA: Diagnosis not present

## 2023-01-18 DIAGNOSIS — R053 Chronic cough: Secondary | ICD-10-CM | POA: Diagnosis not present

## 2023-01-18 DIAGNOSIS — G4733 Obstructive sleep apnea (adult) (pediatric): Secondary | ICD-10-CM | POA: Diagnosis not present

## 2023-01-18 DIAGNOSIS — R5383 Other fatigue: Secondary | ICD-10-CM | POA: Diagnosis not present

## 2023-01-18 DIAGNOSIS — J301 Allergic rhinitis due to pollen: Secondary | ICD-10-CM | POA: Diagnosis not present

## 2023-01-18 DIAGNOSIS — R918 Other nonspecific abnormal finding of lung field: Secondary | ICD-10-CM | POA: Diagnosis not present

## 2023-01-18 DIAGNOSIS — J4520 Mild intermittent asthma, uncomplicated: Secondary | ICD-10-CM | POA: Diagnosis not present

## 2023-01-19 ENCOUNTER — Ambulatory Visit: Payer: Medicare HMO | Attending: Cardiology | Admitting: Cardiology

## 2023-01-19 ENCOUNTER — Encounter: Payer: Self-pay | Admitting: Cardiology

## 2023-01-19 VITALS — BP 130/80 | HR 73 | Ht 65.0 in | Wt 189.4 lb

## 2023-01-19 DIAGNOSIS — I25119 Atherosclerotic heart disease of native coronary artery with unspecified angina pectoris: Secondary | ICD-10-CM | POA: Diagnosis not present

## 2023-01-19 DIAGNOSIS — Z9181 History of falling: Secondary | ICD-10-CM | POA: Diagnosis not present

## 2023-01-19 DIAGNOSIS — R002 Palpitations: Secondary | ICD-10-CM

## 2023-01-19 DIAGNOSIS — Z01810 Encounter for preprocedural cardiovascular examination: Secondary | ICD-10-CM | POA: Diagnosis not present

## 2023-01-19 DIAGNOSIS — Z0000 Encounter for general adult medical examination without abnormal findings: Secondary | ICD-10-CM | POA: Diagnosis not present

## 2023-01-19 DIAGNOSIS — I350 Nonrheumatic aortic (valve) stenosis: Secondary | ICD-10-CM

## 2023-01-19 DIAGNOSIS — I209 Angina pectoris, unspecified: Secondary | ICD-10-CM

## 2023-01-19 DIAGNOSIS — E782 Mixed hyperlipidemia: Secondary | ICD-10-CM

## 2023-01-19 DIAGNOSIS — I2510 Atherosclerotic heart disease of native coronary artery without angina pectoris: Secondary | ICD-10-CM

## 2023-01-19 NOTE — Patient Instructions (Signed)
Medication Instructions:  Your physician recommends that you continue on your current medications as directed. Please refer to the Current Medication list given to you today.  *If you need a refill on your cardiac medications before your next appointment, please call your pharmacy*   Lab Work: NONE  If you have labs (blood work) drawn today and your tests are completely normal, you will receive your results only by: MyChart Message (if you have MyChart) OR A paper copy in the mail If you have any lab test that is abnormal or we need to change your treatment, we will call you to review the results.   Testing/Procedures: NONE    Follow-Up: At [facility], you and your health needs are our priority.  As part of our continuing mission to provide you with exceptional heart care, we have created designated Provider Care Teams.  These Care Teams include your primary Cardiologist (physician) and Advanced Practice Providers (APPs -  Physician Assistants and Nurse Practitioners) who all work together to provide you with the care you need, when you need it.  We recommend signing up for the patient portal called "MyChart".  Sign up information is provided on this After Visit Summary.  MyChart is used to connect with patients for Virtual Visits (Telemedicine).  Patients are able to view lab/test results, encounter notes, upcoming appointments, etc.  Non-urgent messages can be sent to your provider as well.   To learn more about what you can do with MyChart, go to [email or web address].    Your next appointment:   4 month(s)  Provider:   Belva Crome, MD    Other Instructions

## 2023-01-19 NOTE — Progress Notes (Signed)
Cardiology Office Note:  .   Date:  01/20/2023  ID:  Deborah Hutchinson, DOB 08-10-1945, MRN [id number] PCP: Lucianne Lei, MD  [facility] Providers Cardiologist:  Garwin Brothers, MD    History of Present Illness: .   Deborah Hutchinson is a 77 y.o. female with a past medical history of hypertension, CAD, aortic stenosis, COPD, OSA, GERD, DM 2, dyslipidemia.  Most recently evaluated by Dr. Tomie China on 11/17/2022, at this time she had some complaints of chest pain and the decision was made to proceed with a stress evaluation.  She was also dealing with postural hypotension and her medications were decreased.  She had concerns with palpitations and presyncope and a 2-week monitor was arranged.  Monitor revealed an average heart rate of 70 bpm, predominant rhythm was sinus, bigeminy was present but less than 1% of the time.  She had a Lexiscan that was abnormal so the decision was made to evaluate with a coronary CT a. CTA revealed a calcium score of 762 placing her in the 93rd percentile however FFR was negative for any hemodynamically significance.  There was a pulmonary nodule that was not noted that it was suggested that she follow-up as soon as feasible for further discussion with her PCP.  She presents today accompanied by her daughter for follow-up of her coronary artery disease and also for preoperative valuation.  She continues to have some noncardiac type chest pain, she notices it when she first wakes up in the morning and goes from a lying position to a standing position, she also has ongoing joint pain which makes it hard for her to ambulate.  She has an upcoming procedure for resection of a bladder tumor. She denies chest pain, palpitations, dyspnea, pnd, orthopnea, n, v, dizziness, syncope, edema, weight gain, or early satiety.   ROS: Review of Systems  Constitutional: Negative.   HENT: Negative.    Eyes: Negative.   Respiratory:  Positive for shortness of breath.    Cardiovascular: Negative.   Gastrointestinal: Negative.   Genitourinary: Negative.   Musculoskeletal:  Positive for back pain and joint pain.  Skin: Negative.   Neurological: Negative.   Endo/Heme/Allergies: Negative.   Psychiatric/Behavioral: Negative.       Studies Reviewed: Marland Kitchen   EKG Interpretation Date/Time:  Thursday January 19 2023 14:53:04 EDT Ventricular Rate:  73 PR Interval:  168 QRS Duration:  68 QT Interval:  424 QTC Calculation: 467 R Axis:   -29  Text Interpretation: Normal sinus rhythm Low voltage QRS No changes Abnormal ECG When compared with ECG of 17-Nov-2022 08:40, Septal infarct is now Present Confirmed by Wallis Bamberg [phone]) on 01/20/2023 10:00:02 AM     Risk Assessment/Calculations:             Physical Exam:   VS:  BP 130/80 (BP Location: Right Arm, Patient Position: Sitting, Cuff Size: Normal)   Pulse 73   Ht 5\' 5"  (1.651 m)   Wt 189 lb 6.4 oz (85.9 kg)   SpO2 97%   BMI 31.52 kg/m    Wt Readings from Last 3 Encounters:  01/19/23 189 lb 6.4 oz (85.9 kg)  01/03/23 183 lb (83 kg)  11/23/22 180 lb (81.6 kg)    GEN: Well nourished, well developed in no acute distress NECK: No JVD; No carotid bruits CARDIAC: RRR, 3/6 systolic murmur, rubs, gallops RESPIRATORY:  Clear to auscultation without rales, wheezing or rhonchi  ABDOMEN: Soft, non-tender, non-distended EXTREMITIES:  No edema; No deformity  ASSESSMENT AND PLAN: .   Coronary artery disease- nonobstructive per coronary CTA, Stable with no anginal symptoms. No indication for ischemic evaluation.  Continue aspirin 81 mg daily, continue isosorbide 120 mg daily, continue nitroglycerin as needed, continue Crestor 20 mg daily.  Dyslipidemia-most recent LDL was well-controlled at 54 on 12/23/2022.  Continue Crestor 20 mg daily.  Aortic stenosis-3/6 systolic murmur, denies dizziness, syncope, chest pain.  Was moderate per most recent echo in January of this year.   Lung nodule-this was an  incidental finding on her coronary CTA.  Will forward imaging to her PCP office, advised him to call PCP as well for further recommendations regarding follow-up on this.  Palpitations-monitor previously arranged which revealed and average heart rate of 70 bpm, predominant rhythm was sinus, bigeminy was present but less than 1% of the time.  Currently quiescent.  Preoperative cardiovascular evaluation- According to the Revised Cardiac Risk Index (RCRI), her Perioperative Risk of Major Cardiac Event is (%): 0.9 Her Functional Capacity in METs is: 4.06 according to the Duke Activity Status Index (DASI). Therefore, based on ACC/AHA guidelines, patient would be at acceptable risk for the planned procedure without further cardiovascular testing. I will route this recommendation to the requesting party via Epic fax function.  Regarding her aspirin, would prefer this is continued throughout the perioperative process however if it has to be held it can be for 7 days.    Dispo: Has recall in for follow up with Dr. Tomie China.   Signed, Flossie Dibble, NP

## 2023-01-23 DIAGNOSIS — C451 Mesothelioma of peritoneum: Secondary | ICD-10-CM | POA: Diagnosis not present

## 2023-01-25 ENCOUNTER — Other Ambulatory Visit: Payer: Self-pay | Admitting: Urology

## 2023-01-25 DIAGNOSIS — R053 Chronic cough: Secondary | ICD-10-CM | POA: Diagnosis not present

## 2023-01-25 DIAGNOSIS — G4733 Obstructive sleep apnea (adult) (pediatric): Secondary | ICD-10-CM | POA: Diagnosis not present

## 2023-01-25 DIAGNOSIS — J4540 Moderate persistent asthma, uncomplicated: Secondary | ICD-10-CM | POA: Diagnosis not present

## 2023-01-25 DIAGNOSIS — J301 Allergic rhinitis due to pollen: Secondary | ICD-10-CM | POA: Diagnosis not present

## 2023-01-25 DIAGNOSIS — J4530 Mild persistent asthma, uncomplicated: Secondary | ICD-10-CM | POA: Diagnosis not present

## 2023-01-30 NOTE — Patient Instructions (Signed)
SURGICAL WAITING ROOM VISITATION  Patients having surgery or a procedure may have no more than 2 support people in the waiting area - these visitors may rotate.    Children under the age of 69 must have an adult with them who is not the patient.  Due to an increase in RSV and influenza rates and associated hospitalizations, children ages 31 and under may not visit patients in [facility] hospitals.  If the patient needs to stay at the hospital during part of their recovery, the visitor guidelines for inpatient rooms apply. Pre-op nurse will coordinate an appropriate time for 1 support person to accompany patient in pre-op.  This support person may not rotate.    Please refer to the [facility] website for the visitor guidelines for Inpatients (after your surgery is over and you are in a regular room).    Your procedure is scheduled on: 02/01/23   Report to [facility] Main Entrance    Report to admitting at 6:45 AM   Call this number if you have problems the morning of surgery [phone]   Do not eat food or drink liquids :After Midnight.          If you have questions, please contact your surgeon's office.   FOLLOW BOWEL PREP AND ANY ADDITIONAL PRE OP INSTRUCTIONS YOU RECEIVED FROM YOUR SURGEON'S OFFICE!!!     Oral Hygiene is also important to reduce your risk of infection.                                    Remember - BRUSH YOUR TEETH THE MORNING OF SURGERY WITH YOUR REGULAR TOOTHPASTE  DENTURES WILL BE REMOVED PRIOR TO SURGERY PLEASE DO NOT APPLY "Poly grip" OR ADHESIVES!!!   Stop all vitamins and herbal supplements 7 days before surgery.   Take these medicines the morning of surgery with A SIP OF WATER: Albuterol ,Amlodipine, Bupropion, Famotidine, Isosorbide, Pantoprazole   DO NOT TAKE ANY ORAL DIABETIC MEDICATIONS DAY OF YOUR SURGERY  How to Manage Your Diabetes Before and After Surgery  Why is it important to control my blood sugar before and after  surgery? Improving blood sugar levels before and after surgery helps healing and can limit problems. A way of improving blood sugar control is eating a healthy diet by:  Eating less sugar and carbohydrates  Increasing activity/exercise  Talking with your doctor about reaching your blood sugar goals High blood sugars (greater than 180 mg/dL) can raise your risk of infections and slow your recovery, so you will need to focus on controlling your diabetes during the weeks before surgery. Make sure that the doctor who takes care of your diabetes knows about your planned surgery including the date and location.  How do I manage my blood sugar before surgery? Check your blood sugar at least 4 times a day, starting 2 days before surgery, to make sure that the level is not too high or low. Check your blood sugar the morning of your surgery when you wake up and every 2 hours until you get to the Short Stay unit. If your blood sugar is less than 70 mg/dL, you will need to treat for low blood sugar: Do not take insulin. Treat a low blood sugar (less than 70 mg/dL) with  cup of clear juice (cranberry or apple), 4 glucose tablets, OR glucose gel. Recheck blood sugar in 15 minutes after treatment (to make  sure it is greater than 70 mg/dL). If your blood sugar is not greater than 70 mg/dL on recheck, call [phone] for further instructions. Report your blood sugar to the short stay nurse when you get to Short Stay.  If you are admitted to the hospital after surgery: Your blood sugar will be checked by the staff and you will probably be given insulin after surgery (instead of oral diabetes medicines) to make sure you have good blood sugar levels. The goal for blood sugar control after surgery is 80-180 mg/dL.   WHAT DO I DO ABOUT MY DIABETES MEDICATION?  Do not take oral diabetes medicines (pills) the morning of surgery.  THE DAY BEFORE SURGERY, do not take Invokana.      THE MORNING OF SURGERY, do  not take Victoza or Invokana.  Reviewed and Endorsed by [facility] Patient Education Committee, August 2015  Bring CPAP mask and tubing day of surgery.                              You may not have any metal on your body including hair pins, jewelry, and body piercing             Do not wear make-up, lotions, powders, perfumes, or deodorant  Do not wear nail polish including gel and S&S, artificial/acrylic nails, or any other type of covering on natural nails including finger and toenails. If you have artificial nails, gel coating, etc. that needs to be removed by a nail salon please have this removed prior to surgery or surgery may need to be canceled/ delayed if the surgeon/ anesthesia feels like they are unable to be safely monitored.   Do not shave  48 hours prior to surgery.    Do not bring valuables to the hospital. [facility] IS NOT             RESPONSIBLE   FOR VALUABLES.   Contacts, glasses, dentures or bridgework may not be worn into surgery.  DO NOT BRING YOUR HOME MEDICATIONS TO THE HOSPITAL. PHARMACY WILL DISPENSE MEDICATIONS LISTED ON YOUR MEDICATION LIST TO YOU DURING YOUR ADMISSION IN THE HOSPITAL!    Patients discharged on the day of surgery will not be allowed to drive home.  Someone NEEDS to stay with you for the first 24 hours after anesthesia.   Special Instructions: Bring a copy of your healthcare power of attorney and living will documents the day of surgery if you haven't scanned them before.              Please read over the following fact sheets you were given: IF YOU HAVE QUESTIONS ABOUT YOUR PRE-OP INSTRUCTIONS PLEASE CALL [phone] Contras    If you received a COVID test during your pre-op visit  it is requested that you wear a mask when out in public, stay away from anyone that may not be feeling well and notify your surgeon if you develop symptoms. If you test positive for Covid or have been in contact with anyone that has tested positive in the last  10 days please notify you surgeon.    [facility] - Preparing for Surgery Before surgery, you can play an important role.  Because skin is not sterile, your skin needs to be as free of germs as possible.  You can reduce the number of germs on your skin by washing with CHG (chlorahexidine gluconate) soap before surgery.  CHG is an  antiseptic cleaner which kills germs and bonds with the skin to continue killing germs even after washing. Please DO NOT use if you have an allergy to CHG or antibacterial soaps.  If your skin becomes reddened/irritated stop using the CHG and inform your nurse when you arrive at Short Stay. Do not shave (including legs and underarms) for at least 48 hours prior to the first CHG shower.  You may shave your face/neck.  Please follow these instructions carefully:  1.  Shower with CHG Soap the night before surgery and the  morning of surgery.  2.  If you choose to wash your hair, wash your hair first as usual with your normal  shampoo.  3.  After you shampoo, rinse your hair and body thoroughly to remove the shampoo.                             4.  Use CHG as you would any other liquid soap.  You can apply chg directly to the skin and wash.  Gently with a scrungie or clean washcloth.  5.  Apply the CHG Soap to your body ONLY FROM THE NECK DOWN.   Do   not use on face/ open                           Wound or open sores. Avoid contact with eyes, ears mouth and   genitals (private parts).                       Wash face,  Genitals (private parts) with your normal soap.             6.  Wash thoroughly, paying special attention to the area where your    surgery  will be performed.  7.  Thoroughly rinse your body with warm water from the neck down.  8.  DO NOT shower/wash with your normal soap after using and rinsing off the CHG Soap.                9.  Pat yourself dry with a clean towel.            10.  Wear clean pajamas.            11.  Place clean sheets on your bed the night  of your first shower and do not  sleep with pets. Day of Surgery : Do not apply any lotions/deodorants the morning of surgery.  Please wear clean clothes to the hospital/surgery center.  FAILURE TO FOLLOW THESE INSTRUCTIONS MAY RESULT IN THE CANCELLATION OF YOUR SURGERY  PATIENT SIGNATURE_________________________________  NURSE SIGNATURE__________________________________  ________________________________________________________________________

## 2023-01-30 NOTE — Progress Notes (Addendum)
COVID Vaccine Completed: yes  Date of COVID positive in last 90 days:  PCP - Lucianne Lei, MD Cardiologist - Belva Crome, MD  Cardiac clearance by Wallis Bamberg, NP 01/19/23 in Epic  Coronary CT- 12/05/22 Epic Chest x-ray -  EKG - 01/19/23 Epic Stress Test - 11/23/22 Epic ECHO - 06/03/22 Epic Cardiac Cath - 06/23/20 Epic Pacemaker/ICD device last checked: Spinal Cord Stimulator:  Bowel Prep -   Sleep Study -  CPAP -   Fasting Blood Sugar -  Checks Blood Sugar _____ times a day  Last dose of GLP1 agonist-  N/A GLP1 instructions:  N/A   Last dose of SGLT-2 inhibitors-  N/A SGLT-2 instructions: N/A   Blood Thinner Instructions:  Time Aspirin Instructions: ASA 81, hold 7 days Last Dose:  Activity level:  Can go up a flight of stairs and perform activities of daily living without stopping and without symptoms of chest pain or shortness of breath.  Able to exercise without symptoms  Unable to go up a flight of stairs without symptoms of     Anesthesia review: HTN, CAD, aortic stenosis, COPD, OSA, DM2, heart murmur  Patient denies shortness of breath, fever, cough and chest pain at PAT appointment  Patient verbalized understanding of instructions that were given to them at the PAT appointment. Patient was also instructed that they will need to review over the PAT instructions again at home before surgery.

## 2023-01-31 ENCOUNTER — Encounter (HOSPITAL_COMMUNITY): Payer: Self-pay

## 2023-01-31 ENCOUNTER — Encounter (HOSPITAL_COMMUNITY)
Admission: RE | Admit: 2023-01-31 | Discharge: 2023-01-31 | Disposition: A | Discharge status: Home / Self Care | Payer: Medicare HMO | Source: Ambulatory Visit | Attending: Urology | Admitting: Urology

## 2023-01-31 ENCOUNTER — Other Ambulatory Visit: Payer: Self-pay

## 2023-01-31 VITALS — BP 152/77 | HR 64 | Temp 97.8°F | Resp 16 | Ht 65.5 in | Wt 179.0 lb

## 2023-01-31 DIAGNOSIS — Z9889 Other specified postprocedural states: Secondary | ICD-10-CM | POA: Diagnosis not present

## 2023-01-31 DIAGNOSIS — Z01812 Encounter for preprocedural laboratory examination: Secondary | ICD-10-CM | POA: Diagnosis not present

## 2023-01-31 DIAGNOSIS — Z01818 Encounter for other preprocedural examination: Secondary | ICD-10-CM | POA: Diagnosis present

## 2023-01-31 DIAGNOSIS — I10 Essential (primary) hypertension: Secondary | ICD-10-CM | POA: Insufficient documentation

## 2023-01-31 DIAGNOSIS — D494 Neoplasm of unspecified behavior of bladder: Secondary | ICD-10-CM | POA: Insufficient documentation

## 2023-01-31 DIAGNOSIS — E088 Diabetes mellitus due to underlying condition with unspecified complications: Secondary | ICD-10-CM | POA: Insufficient documentation

## 2023-01-31 DIAGNOSIS — Z79899 Other long term (current) drug therapy: Secondary | ICD-10-CM | POA: Insufficient documentation

## 2023-01-31 DIAGNOSIS — J449 Chronic obstructive pulmonary disease, unspecified: Secondary | ICD-10-CM | POA: Diagnosis not present

## 2023-01-31 LAB — CBC
HCT: 43.9 % (ref 36.0–46.0)
Hemoglobin: 13.5 g/dL (ref 12.0–15.0)
MCH: 29.4 pg (ref 26.0–34.0)
MCHC: 30.8 g/dL (ref 30.0–36.0)
MCV: 95.6 fL (ref 80.0–100.0)
Platelets: 322 10*3/uL (ref 150–400)
RBC: 4.59 MIL/uL (ref 3.87–5.11)
RDW: 15.6 % — ABNORMAL HIGH (ref 11.5–15.5)
WBC: 4.1 10*3/uL (ref 4.0–10.5)
nRBC: 0 % (ref 0.0–0.2)

## 2023-01-31 LAB — BASIC METABOLIC PANEL
Anion gap: 7 (ref 5–15)
BUN: 15 mg/dL (ref 8–23)
CO2: 27 mmol/L (ref 22–32)
Calcium: 9.5 mg/dL (ref 8.9–10.3)
Chloride: 107 mmol/L (ref 98–111)
Creatinine, Ser: 0.96 mg/dL (ref 0.44–1.00)
GFR, Estimated: >60 mL/min (ref >60)
Glucose, Bld: 99 mg/dL (ref 70–99)
Potassium: 3.8 mmol/L (ref 3.5–5.1)
Sodium: 141 mmol/L (ref 135–145)

## 2023-01-31 LAB — GLUCOSE, CAPILLARY: Glucose-Capillary: 100 mg/dL — ABNORMAL HIGH (ref 70–99)

## 2023-01-31 LAB — HEMOGLOBIN A1C
Hgb A1c MFr Bld: 5.7 % — ABNORMAL HIGH (ref 4.8–5.6)
Mean Plasma Glucose: 116.89 mg/dL

## 2023-01-31 NOTE — Anesthesia Preprocedure Evaluation (Signed)
Anesthesia Evaluation  Patient identified by MRN, date of birth, ID band Patient awake    Reviewed: Allergy & Precautions, H&P , NPO status , Patient's Chart, lab work & pertinent test results  Airway Mallampati: II  TM Distance: >3 FB Neck ROM: Full    Dental no notable dental hx. (+) Edentulous Upper, Edentulous Lower, Dental Advisory Given   Pulmonary asthma , sleep apnea , COPD,  COPD inhaler, former smoker   Pulmonary exam normal breath sounds clear to auscultation       Cardiovascular hypertension, Pt. on medications + Valvular Problems/Murmurs AS  Rhythm:Regular Rate:Normal + Systolic murmurs    Neuro/Psych  Headaches  Anxiety Depression       GI/Hepatic Neg liver ROS, hiatal hernia,GERD  Medicated,,  Endo/Other  diabetes, Type 2, Oral Hypoglycemic Agents    Renal/GU Renal InsufficiencyRenal disease  negative genitourinary   Musculoskeletal  (+) Arthritis , Osteoarthritis,    Abdominal   Peds  Hematology  (+) Blood dyscrasia, anemia   Anesthesia Other Findings   Reproductive/Obstetrics negative OB ROS                             Anesthesia Physical Anesthesia Plan  ASA: 3  Anesthesia Plan: General   Post-op Pain Management:    Induction: Intravenous  PONV Risk Score and Plan: 4 or greater and Ondansetron, Dexamethasone and Treatment may vary due to age or medical condition  Airway Management Planned: Oral ETT  Additional Equipment:   Intra-op Plan:   Post-operative Plan: Extubation in OR  Informed Consent: I have reviewed the patients History and Physical, chart, labs and discussed the procedure including the risks, benefits and alternatives for the proposed anesthesia with the patient or authorized representative who has indicated his/her understanding and acceptance.     Dental advisory given  Plan Discussed with: CRNA  Anesthesia Plan Comments: (See PAT note  01/31/23, Christeen Douglas, PA-C)       Anesthesia Quick Evaluation

## 2023-01-31 NOTE — Progress Notes (Signed)
Anesthesia Chart Review   Case: [id number] Date/Time: 02/01/23 0845   Procedure: TRANSURETHRAL RESECTION OF BLADDER TUMOR (TURBT) with GEMCITABINE   Anesthesia type: General   Pre-op diagnosis: BLADDER TUMOR   [facility] ROOM 02 / [facility]   Surgeons: Rene Paci, MD       DISCUSSION:77 y.o. former smoker with h/o HTN, GERD, CAD, moderate AS, DM II, COPD, OSA, CKD, bladder tumor scheduled for above procedure 02/01/2023 with Dr. Cristal Deer Liliane Shi.   Pt seen by cardiology 01/19/2023. Per OV note, "According to the Revised Cardiac Risk Index (RCRI), her Perioperative Risk of Major Cardiac Event is (%): 0.9 Her Functional Capacity in METs is: 4.06 according to the Duke Activity Status Index (DASI). Therefore, based on ACC/AHA guidelines, patient would be at acceptable risk for the planned procedure without further cardiovascular testing. I will route this recommendation to the requesting party via Epic fax function.  Regarding her aspirin, would prefer this is continued throughout the perioperative process however if it has to be held it can be for 7 days."    Echo 06/03/2022 with mild aortic valve stenosis, valve area 1.77 cm2, mean gradient 16.73mmHg.   CT Coronary FFR 12/05/2022 with no significant stenosis.   VS: BP (!) 152/77   Pulse 64   Temp 36.6 C (Oral)   Resp 16   Ht 5' 5.5" (1.664 m)   Wt 81.2 kg   SpO2 97%   BMI 29.33 kg/m   PROVIDERS: Lucianne Lei, MD is PCP   Cardiologist:  Garwin Brothers, MD    LABS: Labs reviewed: Acceptable for surgery. (all labs ordered are listed, but only abnormal results are displayed)  Labs Reviewed  CBC - Abnormal; Notable for the following components:      Result Value   RDW 15.6 (*)    All other components within normal limits  GLUCOSE, CAPILLARY - Abnormal; Notable for the following components:   Glucose-Capillary 100 (*)    All other components within normal limits  HEMOGLOBIN A1C  BASIC METABOLIC PANEL      IMAGES:   EKG:   CV: Myocardial Perfusion 11/23/2022   Findings are consistent with ischemia. The study is intermediate risk.   LV perfusion is abnormal. There is evidence of ischemia. Defect 1: There is a medium defect with moderate reduction in uptake present in the inferior location(s) that is reversible. There is normal wall motion in the defect area. Consistent with ischemia.   Left ventricular function is normal. Nuclear stress EF: 77%. The left ventricular ejection fraction is hyperdynamic (>65%). End diastolic cavity size is normal.   Prior study available for comparison from 12/08/2021.  Echo 06/03/2022 1. GLS -20.3. Left ventricular ejection fraction, by estimation, is 60 to  65%. The left ventricle has normal function. The left ventricle has no  regional wall motion abnormalities. Left ventricular diastolic parameters  are consistent with Grade I  diastolic dysfunction (impaired relaxation).   2. Right ventricular systolic function is normal. The right ventricular  size is normal. There is normal pulmonary artery systolic pressure.   3. The mitral valve is normal in structure. Mild mitral valve  regurgitation. No evidence of mitral stenosis.   4. The aortic valve is calcified. There is moderate calcification of the  aortic valve. There is moderate thickening of the aortic valve. Aortic  valve regurgitation is not visualized. Mild aortic valve stenosis. Aortic  valve area, by VTI measures 1.77  cm. Aortic valve mean gradient measures 16.4 mmHg.  5. The inferior vena cava is normal in size with greater than 50%  respiratory variability, suggesting right atrial pressure of 3 mmHg.  Past Medical History:  Diagnosis Date   Angina pectoris (HCC) 06/17/2020   Anxiety    Arthritis    all over   Asthma    Follows w/ Dr. Britt Bottom, pulmonologist.   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   Chronic kidney disease    Chronic obstructive pulmonary disease (HCC) 02/09/2017   Formatting of this note might be different from the original. Last Assessment & Plan:  I will review her PFTs after I obtain records from her pulmonologist. Based on this we will decide if she needs inhalers, subjectively they do not seem to have helped her at all. She may benefit from a pulmonary rehabilitation program Formatting of this note might be different from the original. Last Assessment    COPD (chronic obstructive pulmonary disease) (HCC) 02/09/2017   Coronary artery disease involving native coronary artery of native heart without angina pectoris 06/10/2015   Depression    Diabetes mellitus due to underlying condition with unspecified complications (HCC) 06/10/2015   Dyslipidemia 06/10/2015   Essential hypertension 06/10/2015   Gastroparesis 09/04/2019   GERD (gastroesophageal reflux disease)    H/O hiatal hernia    Headache 09/24/2018   Heart murmur    Hyperlipemia    Hypertension    Iron deficiency anemia 09/25/2020   Lumbar radicular pain 08/08/2022   Lumbar radiculopathy 12/18/2020   Mixed dyslipidemia 06/10/2015   Moderate aortic stenosis 05/16/2019   Nausea and vomiting 06/16/2020   occasional   Neurogenic claudication 12/18/2020   OSA (obstructive sleep apnea) 02/09/2017   Pain and swelling of toe of right foot 07/15/2016   Palpitations 06/02/2016   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 Mild pancolonic diverticulosis. Highly redundant colon.    DILATION AND CURETTAGE OF UTERUS     one   ENDOSCOPIC MUCOSAL RESECTION N/A 03/22/2021   Procedure: ENDOSCOPIC MUCOSAL RESECTION;  Surgeon: Meridee Score Netty Starring., MD;  [facility];  Service: Gastroenterology;  Laterality: N/A;   ENTEROSCOPY N/A 10/06/2022   Procedure: ENTEROSCOPY;  Surgeon: Lynann Bologna, MD;  [facility];  Service: Gastroenterology;  Laterality: N/A;   ESOPHAGOGASTRODUODENOSCOPY  08/17/2015   Moderate hiatal hernia. Otherwise noraml EGD.    ESOPHAGOGASTRODUODENOSCOPY  09/03/2019   [facility]   ESOPHAGOGASTRODUODENOSCOPY (EGD) WITH PROPOFOL N/A 03/22/2021   Procedure: ESOPHAGOGASTRODUODENOSCOPY (EGD) WITH PROPOFOL;  Surgeon: Meridee Score Netty Starring., MD;  [facility];  Service: Gastroenterology;  Laterality: N/A;   EUS  05/19/2011   Procedure: UPPER ENDOSCOPIC ULTRASOUND (EUS) LINEAR;  Surgeon: Rob Bunting, MD;  [facility];  Service: Endoscopy;  Laterality: N/A;  radial linear    GIVENS CAPSULE STUDY N/A 10/06/2022   Procedure: GIVENS CAPSULE STUDY;  Surgeon: Lynann Bologna, MD;  [facility];  Service: Gastroenterology;  Laterality: N/A;   HEMOSTASIS CLIP PLACEMENT  03/22/2021   Procedure: HEMOSTASIS CLIP PLACEMENT;  Surgeon: Lemar Lofty., MD;  [facility];  Service: Gastroenterology;;   HERNIA REPAIR  05/09/2020   HOT HEMOSTASIS N/A 10/06/2022   Procedure: HOT HEMOSTASIS (ARGON PLASMA COAGULATION/BICAP);  Surgeon: Lynann Bologna, MD;  Location: [facility];  Service: Gastroenterology;  Laterality: N/A;   HYSTEROTOMY     KNEE SURGERY     LAPAROSCOPIC PARAESOPHAGEAL HERNIA REPAIR  12/07/2019   LEFT HEART CATH AND CORONARY ANGIOGRAPHY     LEFT HEART CATH AND CORONARY ANGIOGRAPHY N/A 05/08/2019   Procedure: LEFT HEART  CATH AND CORONARY ANGIOGRAPHY;  Surgeon: Kathleene Hazel, MD;  Location: [facility] INVAS[facility] CV [facility];  Service: Cardiovascular;  Laterality: N/A;   right thumb     tendon repair   RIGHT/LEFT HEART CATH AND CORONARY ANGIOGRAPHY N/A 06/23/2020   Procedure: RIGHT/LEFT HEART CATH AND CORONARY ANGIOGRAPHY;  Surgeon: [place], Peter M, MD;  [facility] INVASIVE CV [facility];  Service: Cardiovascular;  Laterality: N/A;   SHOULDER SURGERY     rotator cuff repair   SUBMUCOSAL LIFTING INJECTION  03/22/2021   Procedure: SUBMUCOSAL LIFTING INJECTION;  Surgeon: Lemar Lofty., MD;  [facility];  Service: Gastroenterology;;   TRANSFORAMINAL LUMBAR INTERBODY FUSION L4-5   02/27/2020   WRIST SURGERY Right     MEDICATIONS:  albuterol (PROVENTIL HFA;VENTOLIN HFA) 108 (90 Base) MCG/ACT inhaler   amLODipine (NORVASC) 10 MG tablet   aspirin EC 81 MG tablet   buPROPion (WELLBUTRIN XL) 300 MG 24 hr tablet   CALCIUM PO   canagliflozin (INVOKANA) 300 MG TABS tablet   Cholecalciferol (VITAMIN D3) 1.25 MG (50000 UT) CAPS   diazepam (VALIUM) 5 MG tablet   diclofenac Sodium (VOLTAREN) 1 % GEL   donepezil (ARICEPT) 10 MG tablet   EPINEPHrine 0.3 mg/0.3 mL IJ SOAJ injection   escitalopram (LEXAPRO) 20 MG tablet   famotidine (PEPCID) 40 MG tablet   Ferrous Sulfate (IRON PO)   Fluticasone-Umeclidin-Vilant (TRELEGY ELLIPTA) 200-62.5-25 MCG/INH AEPB   hydrocortisone-pramoxine (PROCTOFOAM-HC) rectal foam   isosorbide mononitrate (IMDUR) 120 MG 24 hr tablet   levocetirizine (XYZAL) 5 MG tablet   lidocaine (LIDODERM) 5 %   linaclotide (LINZESS) 72 MCG capsule   liraglutide (VICTOZA) 18 MG/3ML SOPN   montelukast (SINGULAIR) 10 MG tablet   Multiple Vitamin (MULTIVITAMIN) capsule   nitroGLYCERIN (NITROSTAT) 0.4 MG SL tablet   nystatin cream (MYCOSTATIN)   pantoprazole (PROTONIX) 40 MG tablet   Potassium Chloride CR (MICRO-K) 8 MEQ CPCR capsule CR   rosuvastatin (CRESTOR) 20 MG tablet   traZODone  (DESYREL) 50 MG tablet   vitamin B-12 (CYANOCOBALAMIN) 500 MCG tablet   No current facility-administered medications for this encounter.     Jodell Cipro Ward, PA-C [facility] [phone]

## 2023-02-01 ENCOUNTER — Ambulatory Visit (HOSPITAL_COMMUNITY): Payer: Self-pay | Admitting: Physician Assistant

## 2023-02-01 ENCOUNTER — Encounter (HOSPITAL_COMMUNITY): Payer: Self-pay | Admitting: Urology

## 2023-02-01 ENCOUNTER — Encounter (HOSPITAL_COMMUNITY)
Admission: RE | Disposition: A | Discharge status: Home / Self Care | Payer: Self-pay | Source: Home / Self Care | Attending: Urology

## 2023-02-01 ENCOUNTER — Ambulatory Visit (HOSPITAL_BASED_OUTPATIENT_CLINIC_OR_DEPARTMENT_OTHER): Payer: Self-pay | Admitting: Certified Registered Nurse Anesthetist

## 2023-02-01 ENCOUNTER — Ambulatory Visit (HOSPITAL_COMMUNITY)
Admission: RE | Admit: 2023-02-01 | Discharge: 2023-02-01 | Disposition: A | Discharge status: Home / Self Care | Payer: Medicare HMO | Attending: Urology | Admitting: Urology

## 2023-02-01 DIAGNOSIS — G8929 Other chronic pain: Secondary | ICD-10-CM | POA: Insufficient documentation

## 2023-02-01 DIAGNOSIS — R519 Headache, unspecified: Secondary | ICD-10-CM | POA: Insufficient documentation

## 2023-02-01 DIAGNOSIS — E1122 Type 2 diabetes mellitus with diabetic chronic kidney disease: Secondary | ICD-10-CM | POA: Insufficient documentation

## 2023-02-01 DIAGNOSIS — E119 Type 2 diabetes mellitus without complications: Secondary | ICD-10-CM

## 2023-02-01 DIAGNOSIS — J449 Chronic obstructive pulmonary disease, unspecified: Secondary | ICD-10-CM

## 2023-02-01 DIAGNOSIS — K3184 Gastroparesis: Secondary | ICD-10-CM | POA: Insufficient documentation

## 2023-02-01 DIAGNOSIS — C679 Malignant neoplasm of bladder, unspecified: Secondary | ICD-10-CM | POA: Diagnosis not present

## 2023-02-01 DIAGNOSIS — Z7984 Long term (current) use of oral hypoglycemic drugs: Secondary | ICD-10-CM | POA: Insufficient documentation

## 2023-02-01 DIAGNOSIS — I129 Hypertensive chronic kidney disease with stage 1 through stage 4 chronic kidney disease, or unspecified chronic kidney disease: Secondary | ICD-10-CM | POA: Insufficient documentation

## 2023-02-01 DIAGNOSIS — F419 Anxiety disorder, unspecified: Secondary | ICD-10-CM | POA: Diagnosis not present

## 2023-02-01 DIAGNOSIS — G4733 Obstructive sleep apnea (adult) (pediatric): Secondary | ICD-10-CM | POA: Insufficient documentation

## 2023-02-01 DIAGNOSIS — N189 Chronic kidney disease, unspecified: Secondary | ICD-10-CM | POA: Diagnosis not present

## 2023-02-01 DIAGNOSIS — K449 Diaphragmatic hernia without obstruction or gangrene: Secondary | ICD-10-CM | POA: Insufficient documentation

## 2023-02-01 DIAGNOSIS — Z87891 Personal history of nicotine dependence: Secondary | ICD-10-CM | POA: Diagnosis not present

## 2023-02-01 DIAGNOSIS — Z833 Family history of diabetes mellitus: Secondary | ICD-10-CM | POA: Insufficient documentation

## 2023-02-01 DIAGNOSIS — E088 Diabetes mellitus due to underlying condition with unspecified complications: Secondary | ICD-10-CM

## 2023-02-01 DIAGNOSIS — D649 Anemia, unspecified: Secondary | ICD-10-CM | POA: Insufficient documentation

## 2023-02-01 DIAGNOSIS — C675 Malignant neoplasm of bladder neck: Secondary | ICD-10-CM | POA: Diagnosis not present

## 2023-02-01 DIAGNOSIS — M1990 Unspecified osteoarthritis, unspecified site: Secondary | ICD-10-CM | POA: Diagnosis not present

## 2023-02-01 DIAGNOSIS — K219 Gastro-esophageal reflux disease without esophagitis: Secondary | ICD-10-CM | POA: Insufficient documentation

## 2023-02-01 DIAGNOSIS — D494 Neoplasm of unspecified behavior of bladder: Secondary | ICD-10-CM

## 2023-02-01 DIAGNOSIS — I10 Essential (primary) hypertension: Secondary | ICD-10-CM

## 2023-02-01 DIAGNOSIS — F32A Depression, unspecified: Secondary | ICD-10-CM | POA: Insufficient documentation

## 2023-02-01 LAB — GLUCOSE, CAPILLARY
Glucose-Capillary: 106 mg/dL — ABNORMAL HIGH (ref 70–99)
Glucose-Capillary: 135 mg/dL — ABNORMAL HIGH (ref 70–99)
Glucose-Capillary: 103 mg/dL — ABNORMAL HIGH (ref 70–99)

## 2023-02-01 SURGERY — TURBT, WITH CHEMOTHERAPEUTIC AGENT INSTILLATION INTO BLADDER
Anesthesia: General

## 2023-02-01 MED ORDER — 0.9 % SODIUM CHLORIDE (POUR BTL) OPTIME
TOPICAL | Status: DC | PRN
Start: 2023-02-01 — End: 2023-02-01
  Administered 2023-02-01: 1000 mL

## 2023-02-01 MED ORDER — SUGAMMADEX SODIUM 200 MG/2ML IV SOLN
INTRAVENOUS | Status: DC | PRN
  Administered 2023-02-01: 200 mg via INTRAVENOUS

## 2023-02-01 MED ORDER — FENTANYL CITRATE (PF) 100 MCG/2ML IJ SOLN
INTRAMUSCULAR | Status: AC
  Filled 2023-02-01: qty 2

## 2023-02-01 MED ORDER — SODIUM CHLORIDE 0.9 % IR SOLN
Status: DC | PRN
  Administered 2023-02-01: 6000 mL

## 2023-02-01 MED ORDER — DEXAMETHASONE SODIUM PHOSPHATE 4 MG/ML IJ SOLN
INTRAMUSCULAR | Status: DC | PRN
  Administered 2023-02-01: 4 mg via INTRAVENOUS

## 2023-02-01 MED ORDER — PHENYLEPHRINE HCL-NACL 20-0.9 MG/250ML-% IV SOLN
INTRAVENOUS | Status: AC
  Filled 2023-02-01: qty 250

## 2023-02-01 MED ORDER — PHENAZOPYRIDINE HCL 200 MG PO TABS: 200.0000 mg | ORAL_TABLET | Freq: Three times a day (TID) | ORAL | 0 refills | Status: DC | PRN

## 2023-02-01 MED ORDER — GEMCITABINE CHEMO FOR BLADDER INSTILLATION 2000 MG
2000.0000 mg | Freq: Once | INTRAVENOUS | Status: AC
  Administered 2023-02-01: 2000 mg via INTRAVESICAL
  Filled 2023-02-01: qty 2000

## 2023-02-01 MED ORDER — LACTATED RINGERS IV SOLN: INTRAVENOUS | Status: DC

## 2023-02-01 MED ORDER — ROCURONIUM BROMIDE 10 MG/ML (PF) SYRINGE
PREFILLED_SYRINGE | INTRAVENOUS | Status: DC | PRN
  Administered 2023-02-01: 40 mg via INTRAVENOUS

## 2023-02-01 MED ORDER — LIDOCAINE 2% (20 MG/ML) 5 ML SYRINGE
INTRAMUSCULAR | Status: DC | PRN
  Administered 2023-02-01: 80 mg via INTRAVENOUS

## 2023-02-01 MED ORDER — PROPOFOL 10 MG/ML IV BOLUS
INTRAVENOUS | Status: AC
  Filled 2023-02-01: qty 20

## 2023-02-01 MED ORDER — FENTANYL CITRATE (PF) 100 MCG/2ML IJ SOLN
INTRAMUSCULAR | Status: DC | PRN
  Administered 2023-02-01 (×4): 50 ug via INTRAVENOUS

## 2023-02-01 MED ORDER — INSULIN ASPART 100 UNIT/ML IJ SOLN: 0.0000 [IU] | INTRAMUSCULAR | Status: DC | PRN

## 2023-02-01 MED ORDER — ORAL CARE MOUTH RINSE: 15.0000 mL | Freq: Once | OROMUCOSAL | Status: AC

## 2023-02-01 MED ORDER — OXYBUTYNIN CHLORIDE 5 MG PO TABS
5.0000 mg | ORAL_TABLET | Freq: Three times a day (TID) | ORAL | 1 refills | Status: AC | PRN
Start: 2023-02-01 — End: ?

## 2023-02-01 MED ORDER — PHENYLEPHRINE HCL-NACL 20-0.9 MG/250ML-% IV SOLN
INTRAVENOUS | Status: DC | PRN
Start: 2023-02-01 — End: 2023-02-01
  Administered 2023-02-01: 40 ug/min via INTRAVENOUS

## 2023-02-01 MED ORDER — CHLORHEXIDINE GLUCONATE 0.12 % MT SOLN
15.0000 mL | Freq: Once | OROMUCOSAL | Status: AC
  Administered 2023-02-01: 15 mL via OROMUCOSAL

## 2023-02-01 MED ORDER — PHENYLEPHRINE 80 MCG/ML (10ML) SYRINGE FOR IV PUSH (FOR BLOOD PRESSURE SUPPORT)
PREFILLED_SYRINGE | INTRAVENOUS | Status: DC | PRN
  Administered 2023-02-01 (×2): 40 ug via INTRAVENOUS

## 2023-02-01 MED ORDER — FENTANYL CITRATE PF 50 MCG/ML IJ SOSY: 25.0000 ug | PREFILLED_SYRINGE | INTRAMUSCULAR | Status: DC | PRN

## 2023-02-01 MED ORDER — PROPOFOL 10 MG/ML IV BOLUS
INTRAVENOUS | Status: DC | PRN
  Administered 2023-02-01: 120 mg via INTRAVENOUS

## 2023-02-01 MED ORDER — CIPROFLOXACIN IN D5W 400 MG/200ML IV SOLN
400.0000 mg | INTRAVENOUS | Status: AC
  Administered 2023-02-01: 400 mg via INTRAVENOUS
  Filled 2023-02-01: qty 200

## 2023-02-01 MED ORDER — ONDANSETRON HCL 4 MG/2ML IJ SOLN
INTRAMUSCULAR | Status: DC | PRN
  Administered 2023-02-01: 4 mg via INTRAVENOUS

## 2023-02-01 SURGICAL SUPPLY — 18 items
BAG DRN RND TRDRP ANRFLXCHMBR (UROLOGICAL SUPPLIES)
BAG URINE DRAIN 2000ML AR STRL (UROLOGICAL SUPPLIES) IMPLANT
BAG URO CATCHER STRL LF (MISCELLANEOUS) ×1 IMPLANT
CATH FOLEY 2WAY SLVR 5CC 18FR (CATHETERS) IMPLANT
CATH FOLEY 3WAY 5CC 18FR (CATHETERS) IMPLANT
DRAPE FOOT SWITCH (DRAPES) ×1 IMPLANT
ELECT REM PT RETURN 15FT ADLT (MISCELLANEOUS) ×1 IMPLANT
GLOVE SURG LX STRL 7.5 STRW (GLOVE) ×1 IMPLANT
GOWN STRL REUS W/ TWL XL LVL3 (GOWN DISPOSABLE) ×1 IMPLANT
GOWN STRL REUS W/TWL XL LVL3 (GOWN DISPOSABLE) ×1
KIT TURNOVER KIT A (KITS) IMPLANT
LOOP CUT BIPOLAR 24F LRG (ELECTROSURGICAL) IMPLANT
MANIFOLD NEPTUNE II (INSTRUMENTS) ×1 IMPLANT
PACK CYSTO (CUSTOM PROCEDURE TRAY) ×1 IMPLANT
SYR TOOMEY IRRIG 70ML (MISCELLANEOUS)
SYRINGE TOOMEY IRRIG 70ML (MISCELLANEOUS) IMPLANT
TUBING CONNECTING 10 (TUBING) ×1 IMPLANT
TUBING UROLOGY SET (TUBING) ×1 IMPLANT

## 2023-02-01 NOTE — Transfer of Care (Signed)
Immediate Anesthesia Transfer of Care Note  Patient: Deborah Hutchinson  Procedure(s) Performed: Procedure(s): TRANSURETHRAL RESECTION OF BLADDER TUMOR (TURBT) with GEMCITABINE (N/A)  Patient Location: PACU  Anesthesia Type:General  Level of Consciousness: Patient easily awoken, sedated, comfortable, cooperative, following commands, responds to stimulation.   Airway & Oxygen Therapy: Patient spontaneously breathing, ventilating well, oxygen via simple oxygen mask.  Post-op Assessment: Report given to PACU RN, vital signs reviewed and stable, moving all extremities.   Post vital signs: Reviewed and stable.  Complications: No apparent anesthesia complications  Last Vitals:  Vitals Value Taken Time  BP 142/76 02/01/23 0946  Temp 36.7 C 02/01/23 0946  Pulse 66 02/01/23 0948  Resp 19 02/01/23 0948  SpO2 100 % 02/01/23 0948  Vitals shown include unfiled device data.  Last Pain:  Vitals:   02/01/23 0714  TempSrc:   PainSc: 0-No pain         Complications: No notable events documented.

## 2023-02-01 NOTE — Anesthesia Postprocedure Evaluation (Signed)
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  Procedure(s) Performed: TRANSURETHRAL RESECTION OF BLADDER TUMOR (TURBT) with GEMCITABINE     Patient location during evaluation: PACU Anesthesia Type: General Level of consciousness: awake and alert Pain management: pain level controlled Vital Signs Assessment: post-procedure vital signs reviewed and stable Respiratory status: spontaneous breathing, nonlabored ventilation, respiratory function stable and patient connected to nasal cannula oxygen Cardiovascular status: blood pressure returned to baseline and stable Postop Assessment: no apparent nausea or vomiting Anesthetic complications: no  No notable events documented.  Last Vitals:  Vitals:   02/01/23 1145 02/01/23 1150  BP:    Pulse:    Resp: 16   Temp: 36.9 C 36.6 C  SpO2:      Last Pain:  Vitals:   02/01/23 1150  TempSrc: Oral  PainSc:                  Ahava Kissoon L Luise Yamamoto

## 2023-02-01 NOTE — H&P (Signed)
Urology Preoperative H&P   Chief Complaint: Bladder tumor  History of Present Illness: Deborah Hutchinson is a 77 y.o. female who was found to have a 1 cm enhancing focus in the bladder on CT chest/abdomen/pelvis from 07/26/2022--no other significant GU abnormalities were identified on that scan. 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   Past Medical History:  Diagnosis Date   Angina pectoris (HCC) 06/17/2020   Anxiety    Arthritis    all over   Asthma    Follows w/ Dr. Britt Bottom, pulmonologist.   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   Chronic kidney disease    Chronic obstructive pulmonary disease (HCC) 02/09/2017   Formatting of this note might be different from the original. Last Assessment & Plan:  I will review her PFTs after I obtain records from her pulmonologist. Based on this we will decide if she needs inhalers, subjectively they do not seem to have helped her at all. She may benefit from a pulmonary rehabilitation program Formatting of this note might be different from the original. Last Assessment    COPD (chronic obstructive pulmonary disease) (HCC) 02/09/2017   Coronary artery disease involving native coronary artery of native heart without angina pectoris 06/10/2015   Depression    Diabetes mellitus due to underlying condition with unspecified complications (HCC) 06/10/2015   Dyslipidemia 06/10/2015   Essential hypertension 06/10/2015   Gastroparesis 09/04/2019   GERD (gastroesophageal reflux disease)    H/O hiatal hernia    Headache 09/24/2018   Heart murmur    Hyperlipemia    Hypertension    Iron  deficiency anemia 09/25/2020   Lumbar radicular pain 08/08/2022   Lumbar radiculopathy 12/18/2020   Mixed dyslipidemia 06/10/2015   Moderate aortic stenosis 05/16/2019   Nausea and vomiting 06/16/2020   occasional   Neurogenic claudication 12/18/2020   OSA (obstructive sleep apnea) 02/09/2017   Pain and swelling of toe of right foot 07/15/2016   Palpitations 06/02/2016   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 Mild pancolonic diverticulosis. Highly redundant colon.    DILATION AND CURETTAGE OF UTERUS     one   ENDOSCOPIC MUCOSAL RESECTION N/A 03/22/2021   Procedure: ENDOSCOPIC MUCOSAL RESECTION;  Surgeon: Meridee Score Netty Starring., MD;  Location: [facility];  Service: Gastroenterology;  Laterality: N/A;   ENTEROSCOPY N/A 10/06/2022   Procedure: ENTEROSCOPY;  Surgeon: Lynann Bologna, MD;  Location: [facility];  Service: Gastroenterology;  Laterality: N/A;   ESOPHAGOGASTRODUODENOSCOPY  08/17/2015   Moderate hiatal hernia. Otherwise noraml EGD.    ESOPHAGOGASTRODUODENOSCOPY  09/03/2019   [facility]   ESOPHAGOGASTRODUODENOSCOPY (EGD) WITH PROPOFOL N/A 03/22/2021  Procedure: ESOPHAGOGASTRODUODENOSCOPY (EGD) WITH PROPOFOL;  Surgeon: Meridee Score Netty Starring., MD;  Location: [facility];  Service: Gastroenterology;  Laterality: N/A;   EUS  05/19/2011   Procedure: UPPER ENDOSCOPIC ULTRASOUND (EUS) LINEAR;  Surgeon: Rob Bunting, MD;  Location: [facility];  Service: Endoscopy;  Laterality: N/A;  radial linear    GIVENS CAPSULE STUDY N/A 10/06/2022    Procedure: GIVENS CAPSULE STUDY;  Surgeon: Lynann Bologna, MD;  Location: [facility];  Service: Gastroenterology;  Laterality: N/A;   HEMOSTASIS CLIP PLACEMENT  03/22/2021   Procedure: HEMOSTASIS CLIP PLACEMENT;  Surgeon: Lemar Lofty., MD;  Location: [facility];  Service: Gastroenterology;;   HERNIA REPAIR  05/09/2020   HOT HEMOSTASIS N/A 10/06/2022   Procedure: HOT HEMOSTASIS (ARGON PLASMA COAGULATION/BICAP);  Surgeon: Lynann Bologna, MD;  Location: [facility];  Service: Gastroenterology;  Laterality: N/A;   HYSTEROTOMY     KNEE SURGERY     LAPAROSCOPIC PARAESOPHAGEAL HERNIA REPAIR  12/07/2019   LEFT HEART CATH AND CORONARY ANGIOGRAPHY     LEFT HEART CATH AND CORONARY ANGIOGRAPHY N/A 05/08/2019   Procedure: LEFT HEART CATH AND CORONARY ANGIOGRAPHY;  Surgeon: Kathleene Hazel, MD;  Location: [facility];  Service: Cardiovascular;  Laterality: N/A;   right thumb     tendon repair   RIGHT/LEFT HEART CATH AND CORONARY ANGIOGRAPHY N/A 06/23/2020   Procedure: RIGHT/LEFT HEART CATH AND CORONARY ANGIOGRAPHY;  Surgeon: Swaziland, Peter M, MD;  Location: [facility];  Service: Cardiovascular;  Laterality: N/A;   SHOULDER SURGERY     rotator cuff repair   SUBMUCOSAL LIFTING INJECTION  03/22/2021   Procedure: SUBMUCOSAL LIFTING INJECTION;  Surgeon: Lemar Lofty., MD;  Location: [facility];  Service: Gastroenterology;;   TRANSFORAMINAL LUMBAR INTERBODY FUSION L4-5   02/27/2020   WRIST SURGERY Right     Allergies:  Allergies  Allergen Reactions   Penicillins Rash    Reaction was 30years ago  Has never taken again   Ampicillin Hives   Gabapentin Other (See Comments)     Hallucinations     Family History  Problem Relation Age of Onset   Diabetes Sister    Cancer Sister    Hypertension Daughter    Colon cancer Neg Hx    Liver disease Neg Hx    Pancreatic cancer Neg Hx    Esophageal cancer Neg Hx     Social History:  reports that she quit  smoking about 11 years ago. Her smoking use included cigarettes. She has never used smokeless tobacco. She reports that she does not drink alcohol and does not use drugs.  ROS: A complete review of systems was performed.  All systems are negative except for pertinent findings as noted.  Physical Exam:  Vital signs in last 24 hours: Temp:  [97.8 F (36.6 C)-97.9 F (36.6 C)] 97.9 F (36.6 C) (09/25 0711) Pulse Rate:  [64-70] 70 (09/25 0711) Resp:  [16] 16 (09/25 0711) BP: (135-152)/(77-81) 135/81 (09/25 0711) SpO2:  [97 %-98 %] 98 % (09/25 0711) Weight:  [81.2 kg] 81.2 kg (09/25 0714) Constitutional:  Alert and oriented, No acute distress Cardiovascular: Regular rate and rhythm, No JVD Respiratory: Normal respiratory effort, Lungs clear bilaterally GI: Abdomen is soft, nontender, nondistended, no abdominal masses GU: No CVA tenderness Lymphatic: No lymphadenopathy Neurologic: Grossly intact, no focal deficits Psychiatric: Normal mood and affect  Laboratory Data:  Recent Labs    01/31/23 1015  WBC 4.1  HGB 13.5  HCT 43.9  PLT 322    Recent  Labs    01/31/23 1015  NA 141  K 3.8  CL 107  GLUCOSE 99  BUN 15  CALCIUM 9.5  CREATININE 0.96     Results for orders placed or performed during the hospital encounter of 02/01/23 (from the past 24 hour(s))  Glucose, capillary     Status: Abnormal   Collection Time: 02/01/23  7:04 AM  Result Value Ref Range   Glucose-Capillary 106 (H) 70 - 99 mg/dL   No results found for this or any previous visit (from the past 240 hour(s)).  Renal Function: Recent Labs    01/31/23 1015  CREATININE 0.96   Estimated Creatinine Clearance: 50.6 mL/min (by C-G formula based on SCr of 0.96 mg/dL).  Radiologic Imaging: No results found.  I independently reviewed the above imaging studies.  Assessment and Plan Deborah Hutchinson is a 77 y.o. female with a 2 cm papillary bladder tumor   The risks, benefits and alternatives of  cystoscopy with TURBT with gemcitabine instillation was discussed with the patient. The risks include, but are not limited to, bleeding, urinary tract infection, bladder perforation requiring prolonged catheterization and/or open bladder repair, ureteral obstruction, voiding dysfunction and the inherent risks of general anesthesia. The patient voices understanding and wishes to proceed.    Rhoderick Moody, MD 02/01/2023, 7:56 AM  [facility] Pager: [phone]

## 2023-02-01 NOTE — Op Note (Signed)
Operative Note  Preoperative diagnosis:  1.  2 cm papillary bladder tumor involving the right anterior bladder neck  Postoperative diagnosis: 1.  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  Cystoscopy with TURBT (medium) 2.  Intravesical instillation of gemcitabine  Surgeon: Rhoderick Moody, MD  Assistants:  None  Anesthesia:  General  Complications:  None  EBL: 10 mL  Specimens: 1.  Superficial and deep margins of right anterior bladder neck tumor  Drains/Catheters: 1.  18 French Foley catheter  Intraoperative findings:   2 cm papillary bladder tumor involving the anterior bladder neck with no other intravesical or urethral abnormalities  Indication:  Deborah Hutchinson is a 77 y.o. 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  She has been consented for the above procedures, voices understanding and wishes to proceed.  Description of procedure:  he patient was taken to the operating room and administered general anesthesia. They were then placed on the table and moved to the dorsal lithotomy position after which the genitalia was sterilely prepped and draped. An official timeout was then performed.  The 75 French resectoscope with the 30 lens and visual obturator were then passed into the bladder under direct visualization. Urethra appeared normal. The visual obturator was then removed and the Gyrus resectoscope element with 30  lens was then inserted and the bladder was fully and systematically inspected. Ureteral orifices were noted to be in the normal anatomic positions.   I first began by resecting the 2 cm papillary tumor along the right anterior wall of the bladder neck.  The tumors were resected down to the detrusor musculature and then sent off for pathologic analysis.  My initial resection was already down to the detrusor  musculature, therefore, I did not proceed with a deeper resection margin out of concern for bladder perforation.  Reinspection of the bladder revealed all obvious tumor had been fully resected and there was no evidence of perforation. The Microvasive evacuator was then used to irrigate the bladder and remove all of the portions of bladder tumor which were sent to pathology. I then removed the resectoscope.  An 54 French Foley catheter was then inserted in the bladder and irrigated. The irrigant returned slightly pink with no clots. The patient was awakened and taken to the recovery room.  While in the recovery room 2000 mg of gemcitabine in 50 mL of water was instilled in the bladder through the catheter and the catheter was plugged. This will remain indwelling for approximately one hour. It will then be drained from the bladder and the catheter will be removed and the patient discharged home.  Plan:  Discharge home  T

## 2023-02-01 NOTE — Anesthesia Procedure Notes (Signed)
Procedure Name: Intubation Date/Time: 02/01/2023 9:02 AM  Performed by: Ludwig Lean, CRNAPre-anesthesia Checklist: Patient identified, Emergency Drugs available, Suction available and Patient being monitored Patient Re-evaluated:Patient Re-evaluated prior to induction Oxygen Delivery Method: Circle system utilized Preoxygenation: Pre-oxygenation with 100% oxygen Induction Type: IV induction Ventilation: Mask ventilation without difficulty Laryngoscope Size: Mac and 3 Grade View: Grade I Tube type: Oral Tube size: 7.0 mm Number of attempts: 1 Airway Equipment and Method: Stylet Placement Confirmation: ETT inserted through vocal cords under direct vision, positive ETCO2 and breath sounds checked- equal and bilateral Secured at: 21 cm Tube secured with: Tape Dental Injury: Teeth and Oropharynx as per pre-operative assessment

## 2023-02-02 LAB — SURGICAL PATHOLOGY

## 2023-02-03 ENCOUNTER — Ambulatory Visit (INDEPENDENT_AMBULATORY_CARE_PROVIDER_SITE_OTHER): Payer: Medicare HMO | Admitting: Podiatry

## 2023-02-03 DIAGNOSIS — J439 Emphysema, unspecified: Secondary | ICD-10-CM | POA: Diagnosis not present

## 2023-02-03 DIAGNOSIS — M7742 Metatarsalgia, left foot: Secondary | ICD-10-CM | POA: Diagnosis not present

## 2023-02-03 DIAGNOSIS — M7741 Metatarsalgia, right foot: Secondary | ICD-10-CM

## 2023-02-03 DIAGNOSIS — L600 Ingrowing nail: Secondary | ICD-10-CM

## 2023-02-03 DIAGNOSIS — M2041 Other hammer toe(s) (acquired), right foot: Secondary | ICD-10-CM

## 2023-02-03 DIAGNOSIS — M2042 Other hammer toe(s) (acquired), left foot: Secondary | ICD-10-CM | POA: Diagnosis not present

## 2023-02-03 DIAGNOSIS — E1169 Type 2 diabetes mellitus with other specified complication: Secondary | ICD-10-CM | POA: Diagnosis not present

## 2023-02-03 DIAGNOSIS — R918 Other nonspecific abnormal finding of lung field: Secondary | ICD-10-CM | POA: Diagnosis not present

## 2023-02-03 MED ORDER — MELOXICAM 7.5 MG PO TABS: 7.5000 mg | ORAL_TABLET | Freq: Every day | ORAL | 0 refills | Status: DC

## 2023-02-03 NOTE — Progress Notes (Unsigned)
Chief Complaint  Patient presents with   Foot Pain    C/o soreness to bilateral feet. Right 2nd and 3rd toe pains, unable to describe specific pain. Bilateral ball of feet pain and top of right foot. Patient is diabetic, A1C 6.7. Denies any numbness.    HPI: 77 y.o. female presents today with concern of sore feet bilateral.  She notes an area on the ball of the foot between the second and fourth toes bilateral that is uncomfortable.  Is aggravated with weightbearing.  As she is diabetic and takes gabapentin.  Denies injury  Past Medical History:  Diagnosis Date   Angina pectoris (HCC) 06/17/2020   Anxiety    Arthritis    all over   Asthma    Follows w/ Dr. Britt Bottom, pulmonologist.   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   Chronic kidney disease    Chronic obstructive pulmonary disease (HCC) 02/09/2017   Formatting of this note might be different from the original. Last Assessment & Plan:  I will review her PFTs after I obtain records from her pulmonologist. Based on this we will decide if she needs inhalers, subjectively they do not seem to have helped her at all. She may benefit from a pulmonary rehabilitation program Formatting of this note might be different from the original. Last Assessment    COPD (chronic obstructive pulmonary disease) (HCC) 02/09/2017   Coronary artery disease involving native coronary artery of native heart without angina pectoris 06/10/2015   Depression    Diabetes mellitus due to underlying condition with unspecified complications (HCC) 06/10/2015   Dyslipidemia 06/10/2015   Essential hypertension 06/10/2015   Gastroparesis 09/04/2019   GERD (gastroesophageal reflux disease)    H/O hiatal hernia     Headache 09/24/2018   Heart murmur    Hyperlipemia    Hypertension    Iron deficiency anemia 09/25/2020   Lumbar radicular pain 08/08/2022   Lumbar radiculopathy 12/18/2020   Mixed dyslipidemia 06/10/2015   Moderate aortic stenosis 05/16/2019   Nausea and vomiting 06/16/2020   occasional   Neurogenic claudication 12/18/2020   OSA (obstructive sleep apnea) 02/09/2017   Pain and swelling of toe of right foot 07/15/2016   Palpitations 06/02/2016   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 Mild pancolonic diverticulosis. Highly redundant colon.    DILATION AND CURETTAGE OF UTERUS     one   ENDOSCOPIC MUCOSAL RESECTION N/A 03/22/2021   Procedure: ENDOSCOPIC MUCOSAL RESECTION;  Surgeon: Meridee Score Netty Starring., MD;  Location: [facility];  Service: Gastroenterology;  Laterality: N/A;   ENTEROSCOPY N/A 10/06/2022   Procedure: ENTEROSCOPY;  Surgeon: Lynann Bologna, MD;  Location: [facility];  Service: Gastroenterology;  Laterality: N/A;   ESOPHAGOGASTRODUODENOSCOPY  08/17/2015  Moderate hiatal hernia. Otherwise noraml EGD.    ESOPHAGOGASTRODUODENOSCOPY  09/03/2019   [facility]   ESOPHAGOGASTRODUODENOSCOPY (EGD) WITH PROPOFOL N/A 03/22/2021   Procedure: ESOPHAGOGASTRODUODENOSCOPY (EGD) WITH PROPOFOL;  Surgeon: Meridee Score Netty Starring., MD;  Location: [facility];  Service: Gastroenterology;  Laterality: N/A;   EUS  05/19/2011   Procedure: UPPER ENDOSCOPIC ULTRASOUND (EUS) LINEAR;  Surgeon: Rob Bunting, MD;  Location: [facility];  Service: Endoscopy;   Laterality: N/A;  radial linear    GIVENS CAPSULE STUDY N/A 10/06/2022   Procedure: GIVENS CAPSULE STUDY;  Surgeon: Lynann Bologna, MD;  Location: [facility];  Service: Gastroenterology;  Laterality: N/A;   HEMOSTASIS CLIP PLACEMENT  03/22/2021   Procedure: HEMOSTASIS CLIP PLACEMENT;  Surgeon: Lemar Lofty., MD;  Location: [facility];  Service: Gastroenterology;;   HERNIA REPAIR  05/09/2020   HOT HEMOSTASIS N/A 10/06/2022   Procedure: HOT HEMOSTASIS (ARGON PLASMA COAGULATION/BICAP);  Surgeon: Lynann Bologna, MD;  Location: [facility];  Service: Gastroenterology;  Laterality: N/A;   HYSTEROTOMY     KNEE SURGERY     LAPAROSCOPIC PARAESOPHAGEAL HERNIA REPAIR  12/07/2019   LEFT HEART CATH AND CORONARY ANGIOGRAPHY     LEFT HEART CATH AND CORONARY ANGIOGRAPHY N/A 05/08/2019   Procedure: LEFT HEART CATH AND CORONARY ANGIOGRAPHY;  Surgeon: Kathleene Hazel, MD;  Location: [facility];  Service: Cardiovascular;  Laterality: N/A;   right thumb     tendon repair   RIGHT/LEFT HEART CATH AND CORONARY ANGIOGRAPHY N/A 06/23/2020   Procedure: RIGHT/LEFT HEART CATH AND CORONARY ANGIOGRAPHY;  Surgeon: Swaziland, Peter M, MD;  Location: [facility];  Service: Cardiovascular;  Laterality: N/A;   SHOULDER SURGERY     rotator cuff repair   SUBMUCOSAL LIFTING INJECTION  03/22/2021   Procedure: SUBMUCOSAL LIFTING INJECTION;  Surgeon: Meridee Score Netty Starring., MD;  Location: [facility];  Service: Gastroenterology;;   TRANSFORAMINAL LUMBAR INTERBODY FUSION L4-5   02/27/2020   WRIST SURGERY Right     Allergies  Allergen Reactions   Penicillins Rash    Reaction was 30years ago  Has never taken again   Ampicillin Hives   Gabapentin Other (See Comments)     Hallucinations     Physical Exam: General: The patient is alert and oriented x3 in no acute distress.  Throughout the visit the patient could not sit with her legs and feet still.  She kept moving them throughout the exam  and remainder of the appointment.  Dermatology: Skin is warm, dry and supple bilateral lower extremities. Interspaces are clear of maceration and debris.  Left hallux medial nail is incurvated with pain on palpation along the distal edge.  No erythema or edema or drainage is noted.  Vascular: Palpable pedal pulses bilaterally. Capillary refill within normal limits.  No appreciable edema.  No erythema or calor.  Neurological: Light touch sensation grossly intact bilateral feet.   Musculoskeletal Exam: Pain on palpation of the second and third metatarsal heads plantar aspect of forefoot bilateral.  Mild discomfort with compression of the second interspace bilateral.  Negative Mulder sign with compression of the interspace.  No bony abnormality was palpated.  There is no pain on palpation of the shaft of the metatarsals.  Contracture of the lesser toes at the PIP joints bilateral  Assessment/Plan of Care: 1. Metatarsalgia of both feet   2. Type 2 diabetes mellitus with other specified complication, without long-term current use of insulin (HCC)   3. Hammertoes of both feet   4. Ingrown toenail  Meds ordered this encounter  Medications   meloxicam (MOBIC) 7.5 MG tablet    Sig: Take 1 tablet (7.5 mg total) by mouth daily.    Dispense:  30 tablet    Refill:  0   FOR HOME USE ONLY DME DIABETIC SHOE  Discussed clinical findings with patient today.  Will send in meloxicam 7.5 mg 1 tablet daily for her to take.    Will also get her set up with the diabetic shoes with custom diabetic insoles with [employer].  She qualifies for the diabetic shoes due to diabetes with peripheral neuropathy and hammertoes.  Will reschedule for PNA of left hallux medial nail border.    She does have gabapentin that she takes.  If she does not have much improvement from the meloxicam we may need to adjust her gabapentin dosage.    Follow-up in few weeks.    Clerance Lav, DPM, FACFAS [facility]     [address] [place], [place] [address]                Office [phone]  Fax [phone]

## 2023-02-05 ENCOUNTER — Encounter: Payer: Self-pay | Admitting: Podiatry

## 2023-02-08 ENCOUNTER — Encounter: Payer: Self-pay | Admitting: Neurology

## 2023-02-09 DIAGNOSIS — C673 Malignant neoplasm of anterior wall of bladder: Secondary | ICD-10-CM | POA: Diagnosis not present

## 2023-02-11 ENCOUNTER — Ambulatory Visit
Admission: RE | Admit: 2023-02-11 | Discharge: 2023-02-11 | Disposition: A | Discharge status: Home / Self Care | Payer: Medicare HMO | Source: Ambulatory Visit | Attending: Neurology | Admitting: Neurology

## 2023-02-11 DIAGNOSIS — S0990XA Unspecified injury of head, initial encounter: Secondary | ICD-10-CM | POA: Diagnosis not present

## 2023-02-11 DIAGNOSIS — H532 Diplopia: Secondary | ICD-10-CM

## 2023-02-11 DIAGNOSIS — S0990XD Unspecified injury of head, subsequent encounter: Secondary | ICD-10-CM

## 2023-02-11 MED ORDER — GADOPICLENOL 0.5 MMOL/ML IV SOLN
7.5000 mL | Freq: Once | INTRAVENOUS | Status: AC | PRN
  Administered 2023-02-11: 7.5 mL via INTRAVENOUS

## 2023-02-13 DIAGNOSIS — J4520 Mild intermittent asthma, uncomplicated: Secondary | ICD-10-CM | POA: Diagnosis not present

## 2023-02-13 DIAGNOSIS — G4733 Obstructive sleep apnea (adult) (pediatric): Secondary | ICD-10-CM | POA: Diagnosis not present

## 2023-02-13 DIAGNOSIS — R918 Other nonspecific abnormal finding of lung field: Secondary | ICD-10-CM | POA: Diagnosis not present

## 2023-02-13 DIAGNOSIS — J301 Allergic rhinitis due to pollen: Secondary | ICD-10-CM | POA: Diagnosis not present

## 2023-02-14 ENCOUNTER — Ambulatory Visit: Payer: Medicare HMO

## 2023-02-14 DIAGNOSIS — E1169 Type 2 diabetes mellitus with other specified complication: Secondary | ICD-10-CM

## 2023-02-14 DIAGNOSIS — E119 Type 2 diabetes mellitus without complications: Secondary | ICD-10-CM

## 2023-02-14 DIAGNOSIS — M7741 Metatarsalgia, right foot: Secondary | ICD-10-CM

## 2023-02-14 DIAGNOSIS — R3914 Feeling of incomplete bladder emptying: Secondary | ICD-10-CM | POA: Diagnosis not present

## 2023-02-14 DIAGNOSIS — M2041 Other hammer toe(s) (acquired), right foot: Secondary | ICD-10-CM

## 2023-02-14 DIAGNOSIS — C673 Malignant neoplasm of anterior wall of bladder: Secondary | ICD-10-CM | POA: Diagnosis not present

## 2023-02-14 NOTE — Progress Notes (Signed)
Patient presents to the office today for diabetic shoe and insole measuring.  Patient was measured with brannock device to determine size and width for 1 pair of extra depth shoes and foam casted for 3 pair of insoles.   Documentation of medical necessity will be sent to patient's treating diabetic doctor to verify and sign.   Patient's diabetic provider: Lucianne Lei MD   Shoes and insoles will be ordered at that time and patient will be notified for an appointment for fitting when they arrive.   Shoe size (per patient): 9.5 Brannock measurement: 8 Patient shoe selection- Shoe choice:   [id number] in 9.5 WD runs small    2nd [id number] 9WD if need to order  Shoe size ordered: 9.5W  ABN and financials signed

## 2023-02-16 NOTE — Progress Notes (Deleted)
[facility]  [address] [place],  [place]  [address] [phone]  Clinic Day:  02/16/2023  Referring physician: Lucianne Lei, MD   HISTORY OF PRESENT ILLNESS:  The patient is a 77 y.o. female with a history of recurrent iron deficiency anemia.  Furthermore, a component of her anemia has been related to chronic renal insufficiency.  She comes in today to reassess her iron and hemoglobin levels after receiving another course of IV iron.  Since receiving it, the patient has felt better.  Of note, since her last visit, the patient did undergo a capsule endoscopy, for which no adverse GI tract pathology was seen.  She denies having any overt forms of blood loss since her last visit.    VITALS:  There were no vitals taken for this visit.  Wt Readings from Last 3 Encounters:  02/01/23 179 lb (81.2 kg)  01/31/23 179 lb (81.2 kg)  01/19/23 189 lb 6.4 oz (85.9 kg)    There is no height or weight on file to calculate BMI.  Performance status (ECOG): 1  PHYSICAL EXAM:  Physical Exam Constitutional:      General: She is not in acute distress.    Appearance: Normal appearance. She is normal weight.  HENT:     Head: Normocephalic and atraumatic.  Eyes:     General: No scleral icterus.    Extraocular Movements: Extraocular movements intact.     Conjunctiva/sclera: Conjunctivae normal.     Pupils: Pupils are equal, round, and reactive to light.  Cardiovascular:     Rate and Rhythm: Normal rate and regular rhythm.     Pulses: Normal pulses.     Heart sounds: Normal heart sounds. No murmur heard.    No friction rub. No gallop.  Pulmonary:     Effort: Pulmonary effort is normal. No respiratory distress.     Breath sounds: Normal breath sounds.  Abdominal:     General: Bowel sounds are normal. There is no distension.     Palpations: Abdomen is soft. There is no hepatomegaly, splenomegaly or mass.     Tenderness: There is no abdominal tenderness.   Musculoskeletal:        General: Normal range of motion.     Cervical back: Normal range of motion and neck supple.     Right lower leg: No edema.     Left lower leg: No edema.  Lymphadenopathy:     Cervical: No cervical adenopathy.  Skin:    General: Skin is warm and dry.  Neurological:     General: No focal deficit present.     Mental Status: She is alert and oriented to person, place, and time. Mental status is at baseline.  Psychiatric:        Mood and Affect: Mood normal.        Behavior: Behavior normal.        Thought Content: Thought content normal.        Judgment: Judgment normal.    LABS:     Latest Reference Range & Units 10/17/22 14:01  Iron 28 - 170 ug/dL 45  UIBC ug/dL 295  TIBC 621 - 308 ug/dL 657 (H)  Saturation Ratios 10.4 - 31.8 % 10 (L)  Ferritin 11 - 307 ng/mL 8 (L)  (H): Data is abnormally high (L): Data is abnormally low  ASSESSMENT & PLAN:  Assessment/Plan:  A 77 year old woman with recurrent iron deficiency anemia.  I am pleased with the improvement hemoglobin since  she recently received iron.  As mentioned previously, a repeat capsule endoscopy was done recently, which did not show any adverse GI tract pathology to explain her iron deficiency anemia.  However, her iron parameters remain extremely low.  Based upon this, a repeat course of IV iron will be given.  I will see her back in 4 months for repeat clinical assessment.  The patient understands all the plans discussed today and is in agreement with them.   Ila Landowski Kirby Funk, MD

## 2023-02-17 ENCOUNTER — Inpatient Hospital Stay: Payer: Medicare HMO

## 2023-02-17 ENCOUNTER — Inpatient Hospital Stay: Payer: Medicare HMO | Admitting: Oncology

## 2023-02-20 ENCOUNTER — Inpatient Hospital Stay (INDEPENDENT_AMBULATORY_CARE_PROVIDER_SITE_OTHER): Payer: Medicare HMO | Admitting: Oncology

## 2023-02-20 ENCOUNTER — Inpatient Hospital Stay: Payer: Medicare HMO | Attending: Oncology

## 2023-02-20 ENCOUNTER — Other Ambulatory Visit: Payer: Self-pay | Admitting: Oncology

## 2023-02-20 VITALS — BP 174/105 | HR 73 | Temp 97.8°F | Resp 16 | Ht 65.0 in | Wt 176.4 lb

## 2023-02-20 DIAGNOSIS — C679 Malignant neoplasm of bladder, unspecified: Secondary | ICD-10-CM | POA: Diagnosis not present

## 2023-02-20 DIAGNOSIS — N189 Chronic kidney disease, unspecified: Secondary | ICD-10-CM | POA: Diagnosis not present

## 2023-02-20 DIAGNOSIS — D649 Anemia, unspecified: Secondary | ICD-10-CM | POA: Diagnosis not present

## 2023-02-20 DIAGNOSIS — D508 Other iron deficiency anemias: Secondary | ICD-10-CM | POA: Diagnosis not present

## 2023-02-20 DIAGNOSIS — D473 Essential (hemorrhagic) thrombocythemia: Secondary | ICD-10-CM | POA: Diagnosis not present

## 2023-02-20 DIAGNOSIS — D509 Iron deficiency anemia, unspecified: Secondary | ICD-10-CM | POA: Insufficient documentation

## 2023-02-20 LAB — CBC AND DIFFERENTIAL
HCT: 40 (ref 36–46)
Hemoglobin: 13 (ref 12.0–16.0)
Neutrophils Absolute: 2.58
Platelets: 471 10*3/uL — AB (ref 150–400)
WBC: 5.6

## 2023-02-20 LAB — IRON AND TIBC
Iron: 97 ug/dL (ref 28–170)
Saturation Ratios: 23 % (ref 10.4–31.8)
TIBC: 416 ug/dL (ref 250–450)
UIBC: 319 ug/dL

## 2023-02-20 LAB — CMP (CANCER CENTER ONLY)
ALT: 50 U/L — ABNORMAL HIGH (ref 0–44)
AST: 41 U/L (ref 15–41)
Albumin: 4.2 g/dL (ref 3.5–5.0)
Alkaline Phosphatase: 92 U/L (ref 38–126)
Anion gap: 12 (ref 5–15)
BUN: 13 mg/dL (ref 8–23)
CO2: 24 mmol/L (ref 22–32)
Calcium: 9.9 mg/dL (ref 8.9–10.3)
Chloride: 105 mmol/L (ref 98–111)
Creatinine: 1.12 mg/dL — ABNORMAL HIGH (ref 0.44–1.00)
GFR, Estimated: 50 mL/min — ABNORMAL LOW (ref >60)
Glucose, Bld: 80 mg/dL (ref 70–99)
Potassium: 4 mmol/L (ref 3.5–5.1)
Sodium: 141 mmol/L (ref 135–145)
Total Bilirubin: 0.5 mg/dL (ref 0.3–1.2)
Total Protein: 7 g/dL (ref 6.5–8.1)

## 2023-02-20 LAB — CBC: RBC: 4.43 (ref 3.87–5.11)

## 2023-02-20 LAB — FERRITIN: Ferritin: 54 ng/mL (ref 11–307)

## 2023-02-20 NOTE — Progress Notes (Unsigned)
[facility]  [address] [place],  [place]  [address] [phone]  Clinic Day:  02/20/2023  Referring physician: Lucianne Lei, MD   HISTORY OF PRESENT ILLNESS:  The patient is a 78 y.o. female with a history of recurrent iron deficiency anemia.  Furthermore, a component of her anemia has been related to chronic renal insufficiency.  She comes in today to reassess her iron and hemoglobin levels after receiving another course of IV iron.  Since receiving it, the patient has felt better.  She denies having increased fatigue or any overt forms of blood loss to explain her iron deficiency anemia.  However, she has been diagnosed with early stage bladder cancer, for which she is about to start BCG therapy x 6 weeks.  VITALS:  Blood pressure (!) 174/105, pulse 73, temperature 97.8 F (36.6 C), resp. rate 16, height 5\' 5"  (1.651 m), weight 176 lb 6.4 oz (80 kg), SpO2 94%.  Wt Readings from Last 3 Encounters:  02/20/23 176 lb 6.4 oz (80 kg)  02/01/23 179 lb (81.2 kg)  01/31/23 179 lb (81.2 kg)    Body mass index is 29.35 kg/m.  Performance status (ECOG): 1  PHYSICAL EXAM:  Physical Exam Constitutional:      General: She is not in acute distress.    Appearance: Normal appearance. She is normal weight.  HENT:     Head: Normocephalic and atraumatic.  Eyes:     General: No scleral icterus.    Extraocular Movements: Extraocular movements intact.     Conjunctiva/sclera: Conjunctivae normal.     Pupils: Pupils are equal, round, and reactive to light.  Cardiovascular:     Rate and Rhythm: Normal rate and regular rhythm.     Pulses: Normal pulses.     Heart sounds: Normal heart sounds. No murmur heard.    No friction rub. No gallop.  Pulmonary:     Effort: Pulmonary effort is normal. No respiratory distress.     Breath sounds: Normal breath sounds.  Abdominal:     General: Bowel sounds are normal. There is no distension.     Palpations: Abdomen is  soft. There is no hepatomegaly, splenomegaly or mass.     Tenderness: There is no abdominal tenderness.  Musculoskeletal:        General: Normal range of motion.     Cervical back: Normal range of motion and neck supple.     Right lower leg: No edema.     Left lower leg: No edema.  Lymphadenopathy:     Cervical: No cervical adenopathy.  Skin:    General: Skin is warm and dry.  Neurological:     General: No focal deficit present.     Mental Status: She is alert and oriented to person, place, and time. Mental status is at baseline.  Psychiatric:        Mood and Affect: Mood normal.        Behavior: Behavior normal.        Thought Content: Thought content normal.        Judgment: Judgment normal.     LABS:     Latest Reference Range & Units 02/20/23 16:03  Iron 28 - 170 ug/dL 97  UIBC ug/dL 914  TIBC 782 - 956 ug/dL 213  Saturation Ratios 10.4 - 31.8 % 23  Ferritin 11 - 307 ng/mL 54    ASSESSMENT & PLAN:  Assessment/Plan:  A 77 year old woman with recurrent iron deficiency anemia.  Her  labs today clearly show an improvement in her iron and hemoglobin levels after recently receiving IV iron.  Clinically, she appears to be doing much better.  As mentioned previously, she is scheduled for 6 weekly treatments of BCG to address her early-stage bladder cancer.  Otherwise, as she is doing well from a hematologic standpoint, I will see her back in 6 months for repeat clinical assessment.  The patient understands all the plans discussed today and is in agreement with them.   Damarcus Reggio Kirby Funk, MD

## 2023-02-21 ENCOUNTER — Encounter: Payer: Self-pay | Admitting: Oncology

## 2023-02-22 ENCOUNTER — Telehealth: Payer: Self-pay | Admitting: Oncology

## 2023-02-22 NOTE — Telephone Encounter (Signed)
Patient has been scheduled. Aware of appt date and time.    Scheduling Message Entered by Rennis Harding A on 02/20/2023 at  8:54 PM Priority: Routine <No visit type provided>  Department: [facility]  Provider:  Scheduling Notes:  Labs/appt 08-21-23

## 2023-03-03 ENCOUNTER — Telehealth: Payer: Self-pay | Admitting: Podiatry

## 2023-03-03 NOTE — Progress Notes (Signed)
ST called shoe entered was for mens patient needs womens shoe only SZ avail was [id number] they will send it for [place] to try  Addison Bailey Cped, CFo, CFm

## 2023-03-03 NOTE — Telephone Encounter (Signed)
Received vm from Tiffany at [employer] this is her second message one was left on 10/10. Pts order form and the order placed thru website do not match. They are wanting to confirm pts shoes. Please call them at [phone]

## 2023-03-09 ENCOUNTER — Ambulatory Visit: Payer: Medicare HMO | Admitting: Podiatry

## 2023-03-15 DIAGNOSIS — E1140 Type 2 diabetes mellitus with diabetic neuropathy, unspecified: Secondary | ICD-10-CM | POA: Diagnosis not present

## 2023-03-15 DIAGNOSIS — C451 Mesothelioma of peritoneum: Secondary | ICD-10-CM | POA: Diagnosis not present

## 2023-03-15 DIAGNOSIS — K219 Gastro-esophageal reflux disease without esophagitis: Secondary | ICD-10-CM | POA: Diagnosis not present

## 2023-03-15 DIAGNOSIS — I10 Essential (primary) hypertension: Secondary | ICD-10-CM | POA: Diagnosis not present

## 2023-03-15 DIAGNOSIS — I2510 Atherosclerotic heart disease of native coronary artery without angina pectoris: Secondary | ICD-10-CM | POA: Diagnosis not present

## 2023-03-15 DIAGNOSIS — M7061 Trochanteric bursitis, right hip: Secondary | ICD-10-CM | POA: Diagnosis not present

## 2023-03-15 DIAGNOSIS — M7062 Trochanteric bursitis, left hip: Secondary | ICD-10-CM | POA: Diagnosis not present

## 2023-03-15 DIAGNOSIS — M533 Sacrococcygeal disorders, not elsewhere classified: Secondary | ICD-10-CM | POA: Diagnosis not present

## 2023-03-15 DIAGNOSIS — M159 Polyosteoarthritis, unspecified: Secondary | ICD-10-CM | POA: Diagnosis not present

## 2023-03-15 DIAGNOSIS — E785 Hyperlipidemia, unspecified: Secondary | ICD-10-CM | POA: Diagnosis not present

## 2023-03-15 DIAGNOSIS — R413 Other amnesia: Secondary | ICD-10-CM | POA: Diagnosis not present

## 2023-03-15 DIAGNOSIS — N3281 Overactive bladder: Secondary | ICD-10-CM | POA: Diagnosis not present

## 2023-03-20 DIAGNOSIS — Z5111 Encounter for antineoplastic chemotherapy: Secondary | ICD-10-CM | POA: Diagnosis not present

## 2023-03-20 DIAGNOSIS — C673 Malignant neoplasm of anterior wall of bladder: Secondary | ICD-10-CM | POA: Diagnosis not present

## 2023-03-23 ENCOUNTER — Ambulatory Visit (INDEPENDENT_AMBULATORY_CARE_PROVIDER_SITE_OTHER): Payer: Medicare HMO | Admitting: Podiatry

## 2023-03-23 DIAGNOSIS — L600 Ingrowing nail: Secondary | ICD-10-CM | POA: Diagnosis not present

## 2023-03-23 NOTE — Progress Notes (Signed)
       Subjective:  Patient ID: Deborah Hutchinson, female    DOB: [birth year],  MRN: [id number]  Deborah Hutchinson presents to clinic today for:  Patient presents with painful ingrowing toenail to the left hallux medial nail border.  PCP is Lucianne Lei, MD.  Allergies  Allergen Reactions   Penicillins Rash    Reaction was 30years ago  Has never taken again   Ampicillin Hives   Gabapentin Other (See Comments)     Hallucinations    Review of Systems: Negative except as noted in the HPI.  Objective:  Deborah Hutchinson is a pleasant 77 y.o. female in NAD. AAO x 3.  Vascular Examination: Capillary refill time is 3-5 seconds to toes bilateral. Palpable pedal pulses b/l LE. Digital hair present b/l. No pedal edema b/l. Skin temperature gradient WNL b/l. No varicosities b/l. No cyanosis or clubbing noted b/l.   Dermatological Examination: There is incurvation of the left hallux medial nail border.  There is pain on palpation of the affected nail border.  There is localized edema and minimal erythema noted.  No drainage is noted.     Latest Ref Rng & Units 01/31/2023   10:15 AM  Hemoglobin A1C  Hemoglobin-A1c 4.8 - 5.6 % 5.7    Assessment/Plan: 1. Ingrown toenail     Discussed patient's condition today.  After obtaining patient consent, the left hallux was anesthetized with a 50:50 mixture of 1% lidocaine plain and 0.5% bupivacaine plain for a total of 3cc's administered.  Upon confirmation of anesthesia, a freer elevator was utilized to free the left hallux medial nail border from the nail bed.  The nail border was then avulsed proximal to the eponychium and removed in toto.  The area was inspected for any remaining spicules.  A chemical matrixectomy was performed with NaOH and neutralized with acetic acid solution.  Antibiotic ointment and a DSD were applied, followed by a Coban dressing.  Patient tolerated the anesthetic and procedure well and will f/u in 2-3 weeks for recheck.   Patient given post-procedure instructions for daily 15-minute Epsom salt soaks, antibiotic ointment and daily use of Bandaids until toe starts to dry / form eschar.   Return in about 2 weeks (around 04/06/2023) for PNA recheck.   Clerance Lav, DPM, FACFAS [facility]     [address] [place], [place] [address]                Office [phone]  Fax [phone]

## 2023-03-23 NOTE — Patient Instructions (Signed)

## 2023-03-24 DIAGNOSIS — M159 Polyosteoarthritis, unspecified: Secondary | ICD-10-CM | POA: Diagnosis not present

## 2023-03-24 DIAGNOSIS — R053 Chronic cough: Secondary | ICD-10-CM | POA: Diagnosis not present

## 2023-03-27 DIAGNOSIS — Z5111 Encounter for antineoplastic chemotherapy: Secondary | ICD-10-CM | POA: Diagnosis not present

## 2023-03-27 DIAGNOSIS — C673 Malignant neoplasm of anterior wall of bladder: Secondary | ICD-10-CM | POA: Diagnosis not present

## 2023-03-28 DIAGNOSIS — M159 Polyosteoarthritis, unspecified: Secondary | ICD-10-CM | POA: Diagnosis not present

## 2023-03-30 DIAGNOSIS — R918 Other nonspecific abnormal finding of lung field: Secondary | ICD-10-CM | POA: Diagnosis not present

## 2023-03-30 DIAGNOSIS — J4520 Mild intermittent asthma, uncomplicated: Secondary | ICD-10-CM | POA: Diagnosis not present

## 2023-03-30 DIAGNOSIS — G4733 Obstructive sleep apnea (adult) (pediatric): Secondary | ICD-10-CM | POA: Diagnosis not present

## 2023-03-30 DIAGNOSIS — J301 Allergic rhinitis due to pollen: Secondary | ICD-10-CM | POA: Diagnosis not present

## 2023-04-03 DIAGNOSIS — Z5111 Encounter for antineoplastic chemotherapy: Secondary | ICD-10-CM | POA: Diagnosis not present

## 2023-04-03 DIAGNOSIS — C673 Malignant neoplasm of anterior wall of bladder: Secondary | ICD-10-CM | POA: Diagnosis not present

## 2023-04-10 ENCOUNTER — Other Ambulatory Visit: Payer: Self-pay | Admitting: Cardiology

## 2023-04-10 DIAGNOSIS — C673 Malignant neoplasm of anterior wall of bladder: Secondary | ICD-10-CM | POA: Diagnosis not present

## 2023-04-10 DIAGNOSIS — Z5111 Encounter for antineoplastic chemotherapy: Secondary | ICD-10-CM | POA: Diagnosis not present

## 2023-04-10 NOTE — Telephone Encounter (Signed)
RX sent

## 2023-04-13 ENCOUNTER — Ambulatory Visit (INDEPENDENT_AMBULATORY_CARE_PROVIDER_SITE_OTHER): Payer: Medicare HMO | Admitting: Podiatry

## 2023-04-13 DIAGNOSIS — L600 Ingrowing nail: Secondary | ICD-10-CM | POA: Diagnosis not present

## 2023-04-13 NOTE — Progress Notes (Signed)
       Subjective:  Patient ID: Deborah Hutchinson, female    DOB: [birth year],  MRN: [id number]  Chief Complaint  Patient presents with   nail check    Soaking it, had some blood draining from it, denies pain and swelling   Deborah Hutchinson presents to clinic today for f/u of PNA to the left hallux medial nail border.  She is scheduled for bilateral hip injections for chronic bursitis 5 days from now.  Notes minimal to no drainage at this point.  She has minimal to no tenderness to the area.  Denies any pus, fever or redness  PCP is Lucianne Lei, MD.  Allergies  Allergen Reactions   Penicillins Rash    Reaction was 30years ago  Has never taken again   Ampicillin Hives   Gabapentin Other (See Comments)     Hallucinations    Objective:  Vascular Examination: Capillary refill time is 3-5 seconds to toes bilateral. Palpable pedal pulses b/l LE. Digital hair present b/l. No pedal edema b/l. Skin temperature gradient WNL b/l. No varicosities b/l. No cyanosis or clubbing noted b/l.   Dermatological Examination: Upon inspection of the PNA site, there are no clinical signs of infection.  No purulence, no necrosis, no malodor present.  Minimal to no erythema present due to NaOH chemical reaction.  Eschar formed along nail margin.  Minimal to no pain on palpation of area.   Assessment/Plan: 1. Ingrown toenail    A curette was used to remove some of the loose eschar tissue from the area.  No active drainage was noted.  Patient was informed that the area appears to be healing well.  There are no signs of infection today.  Small amount of antibiotic ointment and a Band-Aid was applied to the area just in case she has drainage after some of the eschar was removed.  She can continue to cover the area only if there is drainage noted.  Otherwise she can discontinue all post operative instructions.   Clerance Lav, DPM, FACFAS [facility]     [address] [place], [place] [address]                Office [phone]  Fax [phone]

## 2023-04-17 DIAGNOSIS — C673 Malignant neoplasm of anterior wall of bladder: Secondary | ICD-10-CM | POA: Diagnosis not present

## 2023-04-18 DIAGNOSIS — M71552 Other bursitis, not elsewhere classified, left hip: Secondary | ICD-10-CM | POA: Diagnosis not present

## 2023-04-18 DIAGNOSIS — M71551 Other bursitis, not elsewhere classified, right hip: Secondary | ICD-10-CM | POA: Diagnosis not present

## 2023-04-24 DIAGNOSIS — C673 Malignant neoplasm of anterior wall of bladder: Secondary | ICD-10-CM | POA: Diagnosis not present

## 2023-04-25 DIAGNOSIS — R5383 Other fatigue: Secondary | ICD-10-CM | POA: Diagnosis not present

## 2023-04-25 DIAGNOSIS — G4733 Obstructive sleep apnea (adult) (pediatric): Secondary | ICD-10-CM | POA: Diagnosis not present

## 2023-04-27 DIAGNOSIS — Z1231 Encounter for screening mammogram for malignant neoplasm of breast: Secondary | ICD-10-CM | POA: Diagnosis not present

## 2023-05-01 DIAGNOSIS — E1169 Type 2 diabetes mellitus with other specified complication: Secondary | ICD-10-CM | POA: Diagnosis not present

## 2023-05-01 DIAGNOSIS — Z87898 Personal history of other specified conditions: Secondary | ICD-10-CM | POA: Diagnosis not present

## 2023-05-01 DIAGNOSIS — J309 Allergic rhinitis, unspecified: Secondary | ICD-10-CM | POA: Diagnosis not present

## 2023-05-02 DIAGNOSIS — J309 Allergic rhinitis, unspecified: Secondary | ICD-10-CM | POA: Diagnosis not present

## 2023-05-02 DIAGNOSIS — E1169 Type 2 diabetes mellitus with other specified complication: Secondary | ICD-10-CM | POA: Diagnosis not present

## 2023-05-02 DIAGNOSIS — Z79899 Other long term (current) drug therapy: Secondary | ICD-10-CM | POA: Diagnosis not present

## 2023-05-09 ENCOUNTER — Other Ambulatory Visit (INDEPENDENT_AMBULATORY_CARE_PROVIDER_SITE_OTHER): Payer: Medicare HMO

## 2023-05-09 ENCOUNTER — Encounter: Payer: Self-pay | Admitting: Gastroenterology

## 2023-05-09 ENCOUNTER — Ambulatory Visit: Payer: Medicare HMO | Admitting: Gastroenterology

## 2023-05-09 VITALS — BP 136/80 | HR 71 | Ht 65.0 in | Wt 185.0 lb

## 2023-05-09 DIAGNOSIS — D509 Iron deficiency anemia, unspecified: Secondary | ICD-10-CM

## 2023-05-09 DIAGNOSIS — R1012 Left upper quadrant pain: Secondary | ICD-10-CM

## 2023-05-09 DIAGNOSIS — G8929 Other chronic pain: Secondary | ICD-10-CM | POA: Diagnosis not present

## 2023-05-09 DIAGNOSIS — K219 Gastro-esophageal reflux disease without esophagitis: Secondary | ICD-10-CM

## 2023-05-09 DIAGNOSIS — R195 Other fecal abnormalities: Secondary | ICD-10-CM

## 2023-05-09 DIAGNOSIS — K449 Diaphragmatic hernia without obstruction or gangrene: Secondary | ICD-10-CM | POA: Diagnosis not present

## 2023-05-09 DIAGNOSIS — R109 Unspecified abdominal pain: Secondary | ICD-10-CM

## 2023-05-09 DIAGNOSIS — K3184 Gastroparesis: Secondary | ICD-10-CM

## 2023-05-09 DIAGNOSIS — R140 Abdominal distension (gaseous): Secondary | ICD-10-CM

## 2023-05-09 LAB — COMPREHENSIVE METABOLIC PANEL
ALT: 54 U/L — ABNORMAL HIGH (ref 0–35)
AST: 38 U/L — ABNORMAL HIGH (ref 0–37)
Albumin: 3.8 g/dL (ref 3.5–5.2)
Alkaline Phosphatase: 110 U/L (ref 39–117)
BUN: 13 mg/dL (ref 6–23)
CO2: 27 meq/L (ref 19–32)
Calcium: 9.1 mg/dL (ref 8.4–10.5)
Chloride: 108 meq/L (ref 96–112)
Creatinine, Ser: 1.1 mg/dL (ref 0.40–1.20)
GFR: 47.69 mL/min — ABNORMAL LOW (ref >60.00)
Glucose, Bld: 102 mg/dL — ABNORMAL HIGH (ref 70–99)
Potassium: 3.9 meq/L (ref 3.5–5.1)
Sodium: 144 meq/L (ref 135–145)
Total Bilirubin: 0.4 mg/dL (ref 0.2–1.2)
Total Protein: 6.3 g/dL (ref 6.0–8.3)

## 2023-05-09 LAB — CBC WITH DIFFERENTIAL/PLATELET
Basophils Absolute: 0 10*3/uL (ref 0.0–0.1)
Basophils Relative: 0.5 % (ref 0.0–3.0)
Eosinophils Absolute: 0.2 10*3/uL (ref 0.0–0.7)
Eosinophils Relative: 3.1 % (ref 0.0–5.0)
HCT: 38.5 % (ref 36.0–46.0)
Hemoglobin: 12.8 g/dL (ref 12.0–15.0)
Lymphocytes Relative: 14.4 % (ref 12.0–46.0)
Lymphs Abs: 1 10*3/uL (ref 0.7–4.0)
MCHC: 33.2 g/dL (ref 30.0–36.0)
MCV: 90.6 fL (ref 78.0–100.0)
Monocytes Absolute: 0.8 10*3/uL (ref 0.1–1.0)
Monocytes Relative: 11 % (ref 3.0–12.0)
Neutro Abs: 5 10*3/uL (ref 1.4–7.7)
Neutrophils Relative %: 71 % (ref 43.0–77.0)
Platelets: 258 10*3/uL (ref 150.0–400.0)
RBC: 4.25 Mil/uL (ref 3.87–5.11)
RDW: 15.9 % — ABNORMAL HIGH (ref 11.5–15.5)
WBC: 7.1 10*3/uL (ref 4.0–10.5)

## 2023-05-09 NOTE — Patient Instructions (Signed)
 _______________________________________________________  If your blood pressure at your visit was 140/90 or greater, please contact your primary care physician to follow up on this.  _______________________________________________________  If you are age 79 or older, your body mass index should be between 23-30. Your Body mass index is 30.79 kg/m. If this is out of the aforementioned range listed, please consider follow up with your Primary Care Provider.  If you are age 33 or younger, your body mass index should be between 19-25. Your Body mass index is 30.79 kg/m. If this is out of the aformentioned range listed, please consider follow up with your Primary Care Provider.   ________________________________________________________  The  GI providers would like to encourage you to use MYCHART to communicate with providers for non-urgent requests or questions.  Due to long hold times on the telephone, sending your provider a message by [facility] may be a faster and more efficient way to get a response.  Please allow 48 business hours for a response.  Please remember that this is for non-urgent requests.  _______________________________________________________  Continue current medication.  Please make follow up appointment with Dr Mardee  Your provider has requested that you go to the basement level for lab work before leaving today. Press B on the elevator. The lab is located at the first door on the left as you exit the elevator.  You have been scheduled for a CT scan of the abdomen and pelvis at [facility] ([address] [place], [place], [place] [address]).   You are scheduled on 05-18-2023 at 1:30pm. You should arrive by 11:15am your appointment time for registration. Please follow the written instructions below on the day of your exam:  WARNING: IF YOU ARE ALLERGIC TO IODINE/X-RAY DYE, PLEASE NOTIFY RADIOLOGY IMMEDIATELY AT [phone]! YOU WILL BE GIVEN A 13 HOUR PREMEDICATION  PREP.  1) Do not eat or drink anything after 930am (4 hours prior to your test) 2) You will be given 2 bottles of oral contrast to drink on site.    Drink 1 bottle of contrast @ 1130am (2 hours prior to your exam)  Drink 1 bottle of contrast @ 1230pm (1 hour prior to your exam)  You may take any medications as prescribed with a small amount of water, if necessary. If you take any of the following medications: METFORMIN, GLUCOPHAGE, GLUCOVANCE, AVANDAMET, RIOMET, FORTAMET, ACTOPLUS MET, JANUMET, GLUMETZA or METAGLIP, you MAY be asked to HOLD this medication 48 hours AFTER the exam.  The purpose of you drinking the oral contrast is to aid in the visualization of your intestinal tract. The contrast solution may cause some diarrhea. Depending on your individual set of symptoms, you may also receive an intravenous injection of x-ray contrast/dye. Plan on being at [facility] for 30 minutes or longer, depending on the type of exam you are having performed.  This test typically takes 30-45 minutes to complete.  If you have any questions regarding your exam or if you need to reschedule, you may call the CT department at [phone] between the hours of 8:00 am and 5:00 pm, Monday-Friday.  It was a pleasure to see you today!  Thank you for trusting me with your gastrointestinal care!

## 2023-05-09 NOTE — Progress Notes (Signed)
 IMPRESSION and PLAN:    #1.  Abdo pain LUQ/left flank pain-appears to be more musculoskeletal.   #2. 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 S/P multiple iron infusoins.  Followed by hematology. (Resolved off plavix , now on bASA) Recent GI workup included push SBE 10/06/2022 showing 3 cm HH, duodenal AVMs s/p APC. Nl Px jejunum VCE 10/06/2022 was neg for any small bowel active bleeding. Neg colon 11/2020 for etiology of IDA Neg PET 06/2022.  Majority of the hyperplastic gastric polyps were removed on EGD 03/2021 with Dr. Wilhelmenia.  Off plavix   #3. Gastroparesis. Reglan  stopped d/t tardive dyskinesia.  #4. GERD with large HH s/p repair Nov 2021, now with 3 cm residual/recurrent hernia on EGD.  #5. Peritoneal mesothelioma (low grade) at time of [facility] repair. Stable. Last CT chest/A/P March 2023 without progression. Being followed by Dr Claryce. Neg PET 06/2022    Plan:  -Daughter will get FU appt with Dr Mardee (pulm) for new onset cough. -Continue Protonix  40mg  po QAM -Pepcid  20 mg p.o. at bedtime -CBC, CMP -CT abdo/pelvis with contrast -She has appt with Dr Claryce 07/2022.  -D/W pt and daughter. HPI:    Chief Complaint:   Deborah Hutchinson is a 77 y.o. female with CAD on plavix (off bASA), DM2, HH, OSA, COPD, LBP d/t OA, HTN, HLD. Accompanied by her daughter  Discussed the use of AI scribe software for clinical note transcription with the patient, who gave verbal consent to proceed.  History of Present Illness   The patient, with a history of bladder cancer, presents with a new onset cough and associated musculoskeletal pain in the left upper quadrant. The cough started two days prior to the consultation and has been worsening. The patient also reports a sensation of swelling in the same area, which predates the cough. The patient completed a six-week course of BCG for her bladder cancer and is currently off Plavix . She is on a regimen of aspirin  and Protonix   twice daily.  The patient also reports issues with bowel incontinence, particularly at night, which is not associated with the cough. She has a history of constipation and hemorrhoids, which she tries to manage to avoid exacerbation of the hemorrhoids.  The patient has multiple spots on her lungs, which have been stable on recent imaging. She has been under the care of a pulmonologist for this issue. The patient's last CT scan was in February, and she is due for a follow-up scan in March.  The patient also has a history of anemia, which has improved with her hemoglobin now at 13. She is not currently on any iron supplements. The patient also has a hiatal hernia, which is not currently causing any problems.       With recurrent IDA w/t H+ stools (on plavix ) s/p multiple IV iron infusions.  Most recently her Hb has been stable in the range of 11-11.2  Recent GI workup included SBE 10/06/2022 showing 3 cm HH, duodenal AVMs s/p APC.  VCE 10/06/2022 was negative for any small bowel active bleeding.  Most recent CT chest/Abdo/pelvis 07/2022 was negative for any GI abnormalities.  It did show ?Urinary bladder neoplasm.  Patient is awaiting urology appointment since then.  Stable mesothelioma.  From GI standpoint she is doing well except for chronic Abdo bloating with discomfort.  She has postprandial diarrhea but still complains of incomplete evacuation she feels like she has a stool ball sitting in the rectum.  No obvious melena  or hematochezia.      Previous notes: S/P HH repair by Dr. Adelia November 2021. Was found to have low-grade mesothelioma.  She is closely being followed by Dr. Claryce.  No worsening. CT Abdo/pelvis March 2022, 07/2021 as below which did not show any progression.     Past GI procedures: CT C/A/P wih contrast 07/26/2022 1.  There is a centimeter enhancing focus at the right bladder which raises concern for a primary urothelial neoplasm. Consider cystoscopy for further  evaluation- AWAITING urology consulatation  2.  Distal sigmoid diverticula with adjacent stranding concerning for diverticulitis. No evidence of perforation or adjacent fluid collection.  3.  No convincing evidence of progressive peritoneal or diaphragmatic disease.  4.  Similar scattered groundglass opacities/nodules within the lungs and bilateral adrenal nodules.   PET 06/2022 IMPRESSION:  1. No tracer avid solid nodule or mass identified.  2. As noted previously there are multiple ground-glass nodules  identified within both lungs. These do not demonstrate any  appreciable tracer uptake. The largest ground-glass nodule is in the  periphery of the left upper lobe measuring 1.3 cm. Absent FDG uptake  does not exclude the possibility of underlying indolent neoplastic  process such as pulmonary adenocarcinoma and continued interval  surveillance of these nodules is strongly recommended.  3. Stable left adrenal adenoma.  4. Coronary artery calcifications.  5.  Aortic Atherosclerosis (ICD10-I70.0).   CT chest/AP 07/2021 1.  No significant change in appearance of thickening of the diaphragm status post paraesophageal hernia repair. No definite new peritoneal disease or diaphragmatic nodularity identified.  2.  Similar groundglass opacities/nodules throughout the lungs bilaterally.  3.  Similar bilateral adrenal nodules. Can consider MR imaging for further characterization as clinically indicated.  EGD 03/2021 (Dr Wilhelmenia) - No gross lesions in esophagus proximally. LA Grade A esophagitis with no bleeding distally (this is improved from prior). - Z-line irregular, 34 cm from the incisors. - 3 cm hiatal hernia. - Four gastric polyps. Resected and retrieved. Clips (MR conditional) were placed. - Gastritis. Biopsied. - No gross lesions in the duodenal bulb, in the first portion of the duodenum and in the second portion of the duodenum.   EGD 11/27/2020 - LA Grade C reflux esophagitis with  no bleeding. Bx- neg - 3 cm hiatal hernia. S/P fundoplication. - 3 10-12 mm semi-sessile polyps with no bleeding and no stigmata of recent bleeding were found in the gastric body (1 at the diaphragmatic hiatus and the other 2 in proximal body of the stomach). Bx- Hyperplastic - Normal examined duodenum.  Colon 11/27/2020 - Seven 6 to 8 mm polyps in the mid descending colon, in the mid transverse colon, in the proximal ascending colon and in the mid ascending colon, removed with a cold snare. Resected and retrieved. Bx- TAs - Diverticulosis in the sigmoid colon and in the ascending colon.  EGD 08/2019: Candida esophagitis, no stricture, 7 cm hiatal hernia.  Treated empirically for Candida esophagitis  EGD 12/14/2018: Large HH, hyperplastic gastric polyps, eso stricture s/p dil 50 Fr. EGD 08/17/2015 mod HH, neg SB Bx for celiac.  UGI series 04/2019: HH, GERD, no strictures.  Ba tab passed without any problems.  Colonoscopy 12/14/2018-colonic polyps s/p polypectomy, mild pancolonic div. Bx- TA.  CT AP with contrast 02/09/2018: Neg, Stable left adrenal adenoma, small right inguinal hernia.  GES 08/2019: delayed gastric emptying (12% emptied at hr 1, 29% at hr 2, 47% at hr 3, 64% an hr 4).  CT AP 07/2020 Impression: 1. Mildly  thickened appearance of the hemidiaphragms without a discrete measurable mass, overall similar relative to the prior CT dated 01/08/2020 in this patient with biopsy-proven peritoneal mesothelioma involving the right hemidiaphragm. No definite new peritoneal nodules are identified. 2. No ascites. 3. Similar groundglass opacities/nodules throughout the lungs bilaterally, most conspicuous in the right lower lobe. These findings may be infectious, inflammatory, or neoplastic in etiology. 4. 4 mm solid right lower lobe pulmonary nodule, unchanged. The previously identified solid nodule in the right upper lobe is no longer seen. 5. Indeterminate bilateral adrenal lesions, measuring up to  1.7 cm on the left. Consider recharacterization with MRI is recommended. 6. Multiple additional ancillary findings, as above.   ============================  PATHOLOGY: PATHOLOGY: Final Pathologic Diagnosis  A. Frozen peritoneal nodule:        Mesothelioma, epithelioid type, low tumor grade, trabecular pattern.       See comment.   B. Peritoneal nodule:       Mesothelioma, epithelioid type, low tumor grade, trabecular pattern.       See comment.     LHC 05/08/2019: Prox RCA lesion is 50% stenosed. Prox LAD lesion is 40% stenosed. The left ventricular systolic function is normal. LV end diastolic pressure is normal. The left ventricular ejection fraction is greater than 65% by visual estimate. There is no mitral valve regurgitation. Past Medical History:  Diagnosis Date   Angina pectoris (HCC) 06/17/2020   Anxiety    Arthritis    all over   Asthma    Follows w/ Dr. Marina, pulmonologist.   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   Chronic kidney disease    Chronic obstructive pulmonary disease (HCC) 02/09/2017   Formatting of this note might be different from the original. Last Assessment & Plan:  I will review her PFTs after I obtain records from her pulmonologist. Based on this we will decide if she needs inhalers, subjectively they do not seem to have helped her at all. She may benefit from a pulmonary rehabilitation program Formatting of this note might be different from the original. Last Assessment    COPD (chronic obstructive pulmonary disease) (HCC) 02/09/2017   Coronary artery disease involving native coronary artery of native heart without angina pectoris 06/10/2015   Depression    Diabetes mellitus due  to underlying condition with unspecified complications (HCC) 06/10/2015   Dyslipidemia 06/10/2015   Essential hypertension 06/10/2015   Gastroparesis 09/04/2019   GERD (gastroesophageal reflux disease)    H/O hiatal hernia    Headache 09/24/2018   Heart murmur    Hyperlipemia    Hypertension    Iron deficiency anemia 09/25/2020   Lumbar radicular pain 08/08/2022   Lumbar radiculopathy 12/18/2020   Mixed dyslipidemia 06/10/2015   Moderate aortic stenosis 05/16/2019   Nausea and vomiting 06/16/2020   occasional   Neurogenic claudication 12/18/2020   OSA (obstructive sleep apnea) 02/09/2017   Pain and swelling of toe of right foot 07/15/2016   Palpitations 06/02/2016   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   S/P lumbar fusion 03/26/2020   Shortness of breath    on  excertion   Sleep apnea     Current Outpatient Medications  Medication Sig Dispense Refill   albuterol  (PROVENTIL   HFA;VENTOLIN  HFA) 108 (90 Base) MCG/ACT inhaler Inhale 2 puffs into the lungs every 6 (six) hours as needed for wheezing or shortness of breath. 3 Inhaler 3   amLODipine  (NORVASC ) 10 MG tablet Take 10 mg by mouth daily.     budesonide -formoterol  (SYMBICORT ) 160-4.5 MCG/ACT inhaler Inhale 2 puffs into the lungs 2 (two) times daily. 3 Inhaler 3   buPROPion  (WELLBUTRIN  XL) 300 MG 24 hr tablet Take 300 mg by mouth daily.      Cholecalciferol (VITAMIN D  PO) Take 5,000 Units by mouth as directed. Every 2 weeks     clopidogrel  (PLAVIX ) 75 MG tablet Take 1 tablet (75 mg total) by mouth daily. 30 tablet 11   dicyclomine (BENTYL) 20 MG tablet Take 20 mg by mouth every 6 (six) hours as needed.            escitalopram  (LEXAPRO ) 10 MG tablet Take 10 mg by mouth daily.     famotidine  (PEPCID ) 40 MG tablet Take 1 tablet (40 mg total) by mouth at bedtime. 180 tablet 3   fluticasone  (VERAMYST) 27.5 MCG/SPRAY nasal spray Place 2 sprays into the nose daily.     gabapentin (NEURONTIN) 300 MG capsule Take 300 mg by mouth 3  (three) times daily as needed.      hydrALAZINE  (APRESOLINE ) 50 MG tablet Take 1 tablet (50 mg total) by mouth 3 (three) times daily. 180 tablet 2   hydrochlorothiazide  (HYDRODIURIL ) 25 MG tablet Take 1 tablet (25 mg total) by mouth daily. 60 tablet 2   Lancets (ACCU-CHEK MULTICLIX) lancets      linaclotide  (LINZESS ) 72 MCG capsule Take 72 mcg by mouth as needed.     losartan  (COZAAR ) 100 MG tablet Take 1 tablet (100 mg total) by mouth daily. 60 tablet 2   metFORMIN (GLUCOPHAGE) 500 MG tablet Take 500 mg by mouth 2 (two) times daily with a meal.      montelukast  (SINGULAIR ) 10 MG tablet Take 10 mg by mouth at bedtime.      Multiple Vitamin (MULTIVITAMIN) capsule Take 1 capsule by mouth daily.       oxybutynin  (DITROPAN -XL) 10 MG 24 hr tablet Take 10 mg by mouth daily.     pantoprazole  (PROTONIX ) 40 MG tablet Take 1 tablet (40 mg total) by mouth daily. 60 tablet 2   Potassium Chloride  CR (MICRO-K ) 8 MEQ CPCR capsule CR TAKE 1 CAPSULE BY MOUTH ONCE DAILY FOR LOW POTASSIUM     simvastatin  (ZOCOR ) 20 MG tablet Take 20 mg by mouth at bedtime.     spironolactone  (ALDACTONE ) 25 MG tablet Take 1 tablet (25 mg total) by mouth daily. 60 tablet 2   traMADol (ULTRAM) 50 MG tablet Take 50 mg by mouth 2 (two) times daily as needed for pain.     VICTOZA 18 MG/3ML SOPN 12 mg daily.      BD PEN NEEDLE NANO U/F 32G X 4 MM MISC      isosorbide  mononitrate (IMDUR ) 120 MG 24 hr tablet Take 1 tablet (120 mg total) by mouth daily. (Patient not taking: Reported on 06/10/2019) 60 tablet 2   nitroGLYCERIN  (NITROSTAT ) 0.4 MG SL tablet DISSOLVE ONE TABLET UNDER THE TONGUE EVERY 5 MINUTES AS NEEDED FOR CHEST PAIN.  DO NOT EXCEED A TOTAL OF 3 DOSES IN 15 MINUTES (Patient not taking: Reported on 06/10/2019) 25 tablet 5   No current facility-administered medications for this visit.    Past Surgical History:  Procedure Laterality Date   ABDOMINAL HYSTERECTOMY  BIOPSY  03/22/2021   Procedure: BIOPSY;  Surgeon: Wilhelmenia  Aloha Raddle., MD;  Location: [facility];  Service: Gastroenterology;;   BREAST SURGERY     begin mass,left  breast   COLONOSCOPY  08/17/2015   Colonic polyp status post polypectomy. Mild pancolonic diverticulosis. Highly redundant colon.    DILATION AND CURETTAGE OF UTERUS     one   ENDOSCOPIC MUCOSAL RESECTION N/A 03/22/2021   Procedure: ENDOSCOPIC MUCOSAL RESECTION;  Surgeon: Wilhelmenia Aloha Raddle., MD;  Location: [facility];  Service: Gastroenterology;  Laterality: N/A;   ENTEROSCOPY N/A 10/06/2022   Procedure: ENTEROSCOPY;  Surgeon: Charlanne Groom, MD;  Location: [facility];  Service: Gastroenterology;  Laterality: N/A;   ESOPHAGOGASTRODUODENOSCOPY  08/17/2015   Moderate hiatal hernia. Otherwise noraml EGD.    ESOPHAGOGASTRODUODENOSCOPY  09/03/2019   [facility]   ESOPHAGOGASTRODUODENOSCOPY (EGD) WITH PROPOFOL  N/A 03/22/2021   Procedure: ESOPHAGOGASTRODUODENOSCOPY (EGD) WITH PROPOFOL ;  Surgeon: Wilhelmenia Aloha Raddle., MD;  Location: [facility];  Service: Gastroenterology;  Laterality: N/A;   EUS  05/19/2011   Procedure: UPPER ENDOSCOPIC ULTRASOUND (EUS) LINEAR;  Surgeon: Toribio Cedar, MD;  Location: [facility];  Service: Endoscopy;  Laterality: N/A;  radial linear    GIVENS CAPSULE STUDY N/A 10/06/2022   Procedure: GIVENS CAPSULE STUDY;  Surgeon: Charlanne Groom, MD;  Location: [facility];  Service: Gastroenterology;  Laterality: N/A;   HEMOSTASIS CLIP PLACEMENT  03/22/2021   Procedure: HEMOSTASIS CLIP PLACEMENT;  Surgeon: Wilhelmenia Aloha Raddle., MD;  Location: [facility];  Service: Gastroenterology;;   HERNIA REPAIR  05/09/2020   HOT HEMOSTASIS N/A 10/06/2022   Procedure: HOT HEMOSTASIS (ARGON PLASMA COAGULATION/BICAP);  Surgeon: Charlanne Groom, MD;  Location: [facility];  Service: Gastroenterology;  Laterality: N/A;   HYSTEROTOMY     KNEE SURGERY     LAPAROSCOPIC PARAESOPHAGEAL HERNIA REPAIR  12/07/2019   LEFT HEART CATH AND CORONARY ANGIOGRAPHY      LEFT HEART CATH AND CORONARY ANGIOGRAPHY N/A 05/08/2019   Procedure: LEFT HEART CATH AND CORONARY ANGIOGRAPHY;  Surgeon: Verlin Lonni BIRCH, MD;  Location: [facility];  Service: Cardiovascular;  Laterality: N/A;   right thumb     tendon repair   RIGHT/LEFT HEART CATH AND CORONARY ANGIOGRAPHY N/A 06/23/2020   Procedure: RIGHT/LEFT HEART CATH AND CORONARY ANGIOGRAPHY;  Surgeon: Jordan, Peter M, MD;  [facility];  Service: Cardiovascular;  Laterality: N/A;   SHOULDER SURGERY     rotator cuff repair   SUBMUCOSAL LIFTING INJECTION  03/22/2021   Procedure: SUBMUCOSAL LIFTING INJECTION;  Surgeon: Wilhelmenia Aloha Raddle., MD;  Location: [facility];  Service: Gastroenterology;;   TRANSFORAMINAL LUMBAR INTERBODY FUSION L4-5   02/27/2020   WRIST SURGERY Right     Family History  Problem Relation Age of Onset   Diabetes Sister    Cancer Sister    Hypertension Daughter    Colon cancer Neg Hx    Liver disease Neg Hx    Pancreatic cancer Neg Hx    Esophageal cancer Neg Hx     Social History   Tobacco Use   Smoking status: Former    Current packs/day: 0.00    Types: Cigarettes    Quit date: 05/17/2011    Years since quitting: 11.9   Smokeless tobacco: Never  Vaping Use   Vaping status: Never Used  Substance Use Topics   Alcohol use: No   Drug use: No    Allergies  Allergen Reactions   Penicillins Rash    Reaction was 30years ago  Has never taken again   Ampicillin Hives   Gabapentin Other (See Comments)     Hallucinations      Review of Systems: All systems reviewed and negative except where noted in HPI.    Physical Exam:     BP 136/80   Pulse 71   Ht 5' 5 (1.651 m)   Wt 185 lb (83.9 kg)   BMI 30.79 kg/m   Gen: awake, alert, NAD HEENT: anicteric, no pallor Abd: soft, NT/ND, +BS throughout Ext: no c/c/e Neuro: nonfocal     Latest Ref Rng & Units 02/20/2023   12:00 AM 01/31/2023   10:15 AM 10/17/2022   12:00 AM  CBC  WBC  5.6      4.1  6.2      Hemoglobin 12.0 - 16.0 13.0     13.5  11.2      Hematocrit 36 - 46 40     43.9  34      Platelets 150 - 400 K/uL 471     322  406         This result is from an external source.      Latest Ref Rng & Units 02/20/2023    4:04 PM 01/31/2023   10:15 AM 12/23/2022    9:23 AM  CMP  Glucose 70 - 99 mg/dL 80  99  91   BUN 8 - 23 mg/dL 13  15  13    Creatinine 0.44 - 1.00 mg/dL 8.87  9.03  8.99   Sodium 135 - 145 mmol/L 141  141  144   Potassium 3.5 - 5.1 mmol/L 4.0  3.8  4.3   Chloride 98 - 111 mmol/L 105  107  108   CO2 22 - 32 mmol/L 24  27  24    Calcium  8.9 - 10.3 mg/dL 9.9  9.5  9.5   Total Protein 6.5 - 8.1 g/dL 7.0   6.3   Total Bilirubin 0.3 - 1.2 mg/dL 0.5   <9.7   Alkaline Phos 38 - 126 U/L 92   115   AST 15 - 41 U/L 41   33   ALT 0 - 44 U/L 50   38        Nuel Dejaynes,MD 05/09/2023, 8:32 AM

## 2023-05-10 ENCOUNTER — Encounter: Payer: Self-pay | Admitting: Oncology

## 2023-05-11 ENCOUNTER — Encounter: Payer: Self-pay | Admitting: Oncology

## 2023-05-11 DIAGNOSIS — R918 Other nonspecific abnormal finding of lung field: Secondary | ICD-10-CM | POA: Diagnosis not present

## 2023-05-11 DIAGNOSIS — J301 Allergic rhinitis due to pollen: Secondary | ICD-10-CM | POA: Diagnosis not present

## 2023-05-11 DIAGNOSIS — J4521 Mild intermittent asthma with (acute) exacerbation: Secondary | ICD-10-CM | POA: Diagnosis not present

## 2023-05-11 DIAGNOSIS — G4733 Obstructive sleep apnea (adult) (pediatric): Secondary | ICD-10-CM | POA: Diagnosis not present

## 2023-05-12 DIAGNOSIS — K649 Unspecified hemorrhoids: Secondary | ICD-10-CM | POA: Diagnosis not present

## 2023-05-12 DIAGNOSIS — J441 Chronic obstructive pulmonary disease with (acute) exacerbation: Secondary | ICD-10-CM | POA: Diagnosis not present

## 2023-05-13 ENCOUNTER — Encounter: Payer: Self-pay | Admitting: Oncology

## 2023-05-15 ENCOUNTER — Encounter: Payer: Self-pay | Admitting: Oncology

## 2023-05-16 ENCOUNTER — Ambulatory Visit (INDEPENDENT_AMBULATORY_CARE_PROVIDER_SITE_OTHER): Payer: 59

## 2023-05-16 DIAGNOSIS — M7741 Metatarsalgia, right foot: Secondary | ICD-10-CM | POA: Diagnosis not present

## 2023-05-16 DIAGNOSIS — M2141 Flat foot [pes planus] (acquired), right foot: Secondary | ICD-10-CM

## 2023-05-16 DIAGNOSIS — E119 Type 2 diabetes mellitus without complications: Secondary | ICD-10-CM

## 2023-05-16 DIAGNOSIS — M2042 Other hammer toe(s) (acquired), left foot: Secondary | ICD-10-CM | POA: Diagnosis not present

## 2023-05-16 DIAGNOSIS — M2041 Other hammer toe(s) (acquired), right foot: Secondary | ICD-10-CM

## 2023-05-16 DIAGNOSIS — M7742 Metatarsalgia, left foot: Secondary | ICD-10-CM

## 2023-05-16 NOTE — Progress Notes (Signed)

## 2023-05-18 ENCOUNTER — Encounter: Payer: Self-pay | Admitting: Oncology

## 2023-05-18 ENCOUNTER — Ambulatory Visit (HOSPITAL_COMMUNITY)
Admission: RE | Admit: 2023-05-18 | Discharge: 2023-05-18 | Disposition: A | Discharge status: Home / Self Care | Payer: 59 | Source: Ambulatory Visit | Attending: Gastroenterology | Admitting: Gastroenterology

## 2023-05-18 DIAGNOSIS — R140 Abdominal distension (gaseous): Secondary | ICD-10-CM | POA: Diagnosis not present

## 2023-05-18 DIAGNOSIS — K219 Gastro-esophageal reflux disease without esophagitis: Secondary | ICD-10-CM | POA: Insufficient documentation

## 2023-05-18 DIAGNOSIS — K449 Diaphragmatic hernia without obstruction or gangrene: Secondary | ICD-10-CM | POA: Insufficient documentation

## 2023-05-18 DIAGNOSIS — K5730 Diverticulosis of large intestine without perforation or abscess without bleeding: Secondary | ICD-10-CM | POA: Diagnosis not present

## 2023-05-18 DIAGNOSIS — G8929 Other chronic pain: Secondary | ICD-10-CM | POA: Insufficient documentation

## 2023-05-18 DIAGNOSIS — K4090 Unilateral inguinal hernia, without obstruction or gangrene, not specified as recurrent: Secondary | ICD-10-CM | POA: Diagnosis not present

## 2023-05-18 DIAGNOSIS — D509 Iron deficiency anemia, unspecified: Secondary | ICD-10-CM | POA: Diagnosis not present

## 2023-05-18 DIAGNOSIS — R109 Unspecified abdominal pain: Secondary | ICD-10-CM | POA: Diagnosis not present

## 2023-05-18 DIAGNOSIS — R1012 Left upper quadrant pain: Secondary | ICD-10-CM | POA: Diagnosis not present

## 2023-05-18 MED ORDER — IOHEXOL 300 MG/ML  SOLN
100.0000 mL | Freq: Once | INTRAMUSCULAR | Status: AC | PRN
  Administered 2023-05-18: 80 mL via INTRAVENOUS

## 2023-05-18 MED ORDER — IOHEXOL 300 MG/ML  SOLN
30.0000 mL | Freq: Once | INTRAMUSCULAR | Status: AC | PRN
  Administered 2023-05-18: 30 mL via ORAL

## 2023-05-22 ENCOUNTER — Telehealth: Payer: Self-pay | Admitting: Gastroenterology

## 2023-05-22 NOTE — Telephone Encounter (Signed)
 Pt stated that she has been having ongoing issues with hemorrhoids and OTC treatment is not helping. Pt was scheduled to see Alcide Evener NP On 05/26/2023 at 11:00 PM. Pt was made aware. Pt verbalized understanding with all questions answered.

## 2023-05-22 NOTE — Telephone Encounter (Signed)
 PT is requesting a nurse contact her to further discuss her hemorrhoids. Please advise.

## 2023-05-23 DIAGNOSIS — Z1231 Encounter for screening mammogram for malignant neoplasm of breast: Secondary | ICD-10-CM | POA: Diagnosis not present

## 2023-05-25 DIAGNOSIS — G4733 Obstructive sleep apnea (adult) (pediatric): Secondary | ICD-10-CM | POA: Diagnosis not present

## 2023-05-25 DIAGNOSIS — J301 Allergic rhinitis due to pollen: Secondary | ICD-10-CM | POA: Diagnosis not present

## 2023-05-25 DIAGNOSIS — J4520 Mild intermittent asthma, uncomplicated: Secondary | ICD-10-CM | POA: Diagnosis not present

## 2023-05-25 DIAGNOSIS — R918 Other nonspecific abnormal finding of lung field: Secondary | ICD-10-CM | POA: Diagnosis not present

## 2023-05-26 ENCOUNTER — Ambulatory Visit (INDEPENDENT_AMBULATORY_CARE_PROVIDER_SITE_OTHER): Payer: 59 | Admitting: Nurse Practitioner

## 2023-05-26 ENCOUNTER — Encounter: Payer: Self-pay | Admitting: Nurse Practitioner

## 2023-05-26 VITALS — BP 136/70 | HR 72 | Ht 64.0 in | Wt 184.0 lb

## 2023-05-26 DIAGNOSIS — R7989 Other specified abnormal findings of blood chemistry: Secondary | ICD-10-CM

## 2023-05-26 DIAGNOSIS — R194 Change in bowel habit: Secondary | ICD-10-CM

## 2023-05-26 DIAGNOSIS — R159 Full incontinence of feces: Secondary | ICD-10-CM

## 2023-05-26 DIAGNOSIS — K644 Residual hemorrhoidal skin tags: Secondary | ICD-10-CM

## 2023-05-26 DIAGNOSIS — K219 Gastro-esophageal reflux disease without esophagitis: Secondary | ICD-10-CM

## 2023-05-26 DIAGNOSIS — K648 Other hemorrhoids: Secondary | ICD-10-CM

## 2023-05-26 DIAGNOSIS — K649 Unspecified hemorrhoids: Secondary | ICD-10-CM

## 2023-05-26 DIAGNOSIS — Z860101 Personal history of adenomatous and serrated colon polyps: Secondary | ICD-10-CM

## 2023-05-26 DIAGNOSIS — D509 Iron deficiency anemia, unspecified: Secondary | ICD-10-CM

## 2023-05-26 MED ORDER — HYDROCORTISONE ACETATE 25 MG RE SUPP: RECTAL | 0 refills | Status: DC

## 2023-05-26 NOTE — Patient Instructions (Addendum)
We have referred you to pelvic floor therapy at [facility]. They will contact you with an a appointment.   We have sent the following medications to your pharmacy for you to pick up at your convenience: Anusol HC suppositories at night x 5 days.   Please purchase the following medications over the counter and take as directed:Dulcolax suppositories to insert into rectum every third night as needed.      Due to recent changes in healthcare laws, you may see the results of your imaging and laboratory studies on MyChart before your provider has had a chance to review them.  We understand that in some cases there may be results that are confusing or concerning to you. Not all laboratory results come back in the same time frame and the provider may be waiting for multiple results in order to interpret others.  Please give [place] 48 hours in order for your provider to thoroughly review all the results before contacting the office for clarification of your results.   Thank you for trusting me with your gastrointestinal care!   Alcide Evener, CRNP

## 2023-05-26 NOTE — Progress Notes (Unsigned)
     05/26/2023 Deborah Hutchinson [id number] May 30, 1945   Chief Complaint:  History of Present Illness:   She endorses having hemorrhoids. She feels like she has to poop all of the time. Itchy bumps on the outside. Fecal incontinence, oozes stool for the past 6 months. She passes a soft bowel movement that falls apart every 2 day. No blood or black stools. No abdominal pain.   Antibiotic for a cough 1 1/2 weeks ago.     Current Medications, Allergies, Past Medical History, Past Surgical History, Family History and Social History were reviewed in [employer] record.   Review of Systems:   Constitutional: Negative for fever, sweats, chills or weight loss.  Respiratory: Negative for shortness of breath.   Cardiovascular: Negative for chest pain, palpitations and leg swelling.  Gastrointestinal: See HPI.  Musculoskeletal: Negative for back pain or muscle aches.  Neurological: Negative for dizziness, headaches or paresthesias.    Physical Exam: BP 136/70 (BP Location: Left Arm, Patient Position: Sitting, Cuff Size: Normal)   Pulse 72   Ht 5\' 4"  (1.626 m)   Wt 184 lb (83.5 kg)   BMI 31.58 kg/m  General: in no acute distress. Head: Normocephalic and atraumatic. Eyes: No scleral icterus. Conjunctiva pink . Ears: Normal auditory acuity. Mouth: Dentition intact. No ulcers or lesions.  Lungs: Clear throughout to auscultation. Heart: Regular rate and rhythm, no murmur. Abdomen: Soft, nontender and nondistended. No masses or hepatomegaly. Normal bowel sounds x 4 quadrants.  Rectal: *** Musculoskeletal: Symmetrical with no gross deformities. Extremities: No edema. Neurological: Alert oriented x 4. No focal deficits.  Psychological: Alert and cooperative. Normal mood and affect  Assessment and Recommendations: ***

## 2023-05-28 DIAGNOSIS — K219 Gastro-esophageal reflux disease without esophagitis: Secondary | ICD-10-CM | POA: Diagnosis not present

## 2023-05-28 DIAGNOSIS — M7062 Trochanteric bursitis, left hip: Secondary | ICD-10-CM | POA: Diagnosis not present

## 2023-05-28 DIAGNOSIS — C451 Mesothelioma of peritoneum: Secondary | ICD-10-CM | POA: Diagnosis not present

## 2023-05-28 DIAGNOSIS — G4733 Obstructive sleep apnea (adult) (pediatric): Secondary | ICD-10-CM | POA: Diagnosis not present

## 2023-05-28 DIAGNOSIS — I10 Essential (primary) hypertension: Secondary | ICD-10-CM | POA: Diagnosis not present

## 2023-05-28 DIAGNOSIS — N179 Acute kidney failure, unspecified: Secondary | ICD-10-CM | POA: Diagnosis not present

## 2023-05-28 DIAGNOSIS — M5136 Other intervertebral disc degeneration, lumbar region with discogenic back pain only: Secondary | ICD-10-CM | POA: Diagnosis not present

## 2023-05-28 DIAGNOSIS — M5441 Lumbago with sciatica, right side: Secondary | ICD-10-CM | POA: Diagnosis not present

## 2023-05-28 DIAGNOSIS — Z7409 Other reduced mobility: Secondary | ICD-10-CM | POA: Diagnosis not present

## 2023-05-28 DIAGNOSIS — M898X8 Other specified disorders of bone, other site: Secondary | ICD-10-CM | POA: Diagnosis not present

## 2023-05-28 DIAGNOSIS — M160 Bilateral primary osteoarthritis of hip: Secondary | ICD-10-CM | POA: Diagnosis not present

## 2023-05-28 DIAGNOSIS — I2510 Atherosclerotic heart disease of native coronary artery without angina pectoris: Secondary | ICD-10-CM | POA: Diagnosis not present

## 2023-05-28 DIAGNOSIS — Z87891 Personal history of nicotine dependence: Secondary | ICD-10-CM | POA: Diagnosis not present

## 2023-05-28 DIAGNOSIS — E119 Type 2 diabetes mellitus without complications: Secondary | ICD-10-CM | POA: Diagnosis not present

## 2023-05-28 DIAGNOSIS — G8929 Other chronic pain: Secondary | ICD-10-CM | POA: Diagnosis not present

## 2023-05-28 DIAGNOSIS — M7061 Trochanteric bursitis, right hip: Secondary | ICD-10-CM | POA: Diagnosis not present

## 2023-05-28 DIAGNOSIS — D509 Iron deficiency anemia, unspecified: Secondary | ICD-10-CM | POA: Diagnosis not present

## 2023-05-28 DIAGNOSIS — R2689 Other abnormalities of gait and mobility: Secondary | ICD-10-CM | POA: Diagnosis not present

## 2023-05-28 DIAGNOSIS — M4310 Spondylolisthesis, site unspecified: Secondary | ICD-10-CM | POA: Diagnosis not present

## 2023-05-28 DIAGNOSIS — M25551 Pain in right hip: Secondary | ICD-10-CM | POA: Diagnosis not present

## 2023-05-28 DIAGNOSIS — D1809 Hemangioma of other sites: Secondary | ICD-10-CM | POA: Diagnosis not present

## 2023-05-28 DIAGNOSIS — Z85831 Personal history of malignant neoplasm of soft tissue: Secondary | ICD-10-CM | POA: Diagnosis not present

## 2023-05-29 ENCOUNTER — Telehealth: Payer: Self-pay

## 2023-05-29 ENCOUNTER — Other Ambulatory Visit: Payer: Self-pay

## 2023-05-29 DIAGNOSIS — D1809 Hemangioma of other sites: Secondary | ICD-10-CM | POA: Diagnosis not present

## 2023-05-29 DIAGNOSIS — R7989 Other specified abnormal findings of blood chemistry: Secondary | ICD-10-CM

## 2023-05-29 DIAGNOSIS — M4316 Spondylolisthesis, lumbar region: Secondary | ICD-10-CM | POA: Diagnosis not present

## 2023-05-29 DIAGNOSIS — M7601 Gluteal tendinitis, right hip: Secondary | ICD-10-CM | POA: Diagnosis not present

## 2023-05-29 DIAGNOSIS — M160 Bilateral primary osteoarthritis of hip: Secondary | ICD-10-CM | POA: Diagnosis not present

## 2023-05-29 DIAGNOSIS — M47816 Spondylosis without myelopathy or radiculopathy, lumbar region: Secondary | ICD-10-CM | POA: Diagnosis not present

## 2023-05-29 DIAGNOSIS — G8929 Other chronic pain: Secondary | ICD-10-CM | POA: Diagnosis not present

## 2023-05-29 DIAGNOSIS — M5441 Lumbago with sciatica, right side: Secondary | ICD-10-CM | POA: Diagnosis not present

## 2023-05-29 DIAGNOSIS — M48061 Spinal stenosis, lumbar region without neurogenic claudication: Secondary | ICD-10-CM | POA: Diagnosis not present

## 2023-05-29 NOTE — Telephone Encounter (Signed)
Pt made aware of Alcide Evener NP recommendations: Order for the lab placed in Epic. Pt was made aware to come by our lab around 07/27/2023 to have lab drawn. Pt verbalized understanding with all questions answered.

## 2023-05-29 NOTE — Telephone Encounter (Signed)
Message Received: 2 days ago Arnaldo Natal, NP  Lynann Bologna, MD; Emeline Darling, RN Dr. Chales Abrahams, pls clarify the significance of the anterior wall soft tissue finding on CT.  [name], pls send patient back to our lab in 2 months to repeat a hepatic panel thx.       Office Visit for Hemorrhoids 05/26/2023 Arnaldo Natal, NP - [facility] Diagnoses  Incontinence Of Feces, Unspecified Fecal Incontinence Type (Primary) Hemorrhoids, unspecified hemorrhoid type Orders Signed This Visit  (2) AMB referral to rehabilitation hydrocortisone (ANUSOL-HC) 25 MG suppository Orders Pended This Visit  None Progress Notes  Arnaldo Natal, NP at 05/26/2023 11:00 AM

## 2023-05-30 DIAGNOSIS — G8929 Other chronic pain: Secondary | ICD-10-CM | POA: Diagnosis not present

## 2023-05-30 DIAGNOSIS — M5441 Lumbago with sciatica, right side: Secondary | ICD-10-CM | POA: Diagnosis not present

## 2023-05-31 DIAGNOSIS — G8929 Other chronic pain: Secondary | ICD-10-CM | POA: Diagnosis not present

## 2023-05-31 DIAGNOSIS — M5441 Lumbago with sciatica, right side: Secondary | ICD-10-CM | POA: Diagnosis not present

## 2023-06-02 DIAGNOSIS — D509 Iron deficiency anemia, unspecified: Secondary | ICD-10-CM | POA: Diagnosis not present

## 2023-06-02 DIAGNOSIS — Z09 Encounter for follow-up examination after completed treatment for conditions other than malignant neoplasm: Secondary | ICD-10-CM | POA: Diagnosis not present

## 2023-06-02 DIAGNOSIS — E7800 Pure hypercholesterolemia, unspecified: Secondary | ICD-10-CM | POA: Diagnosis not present

## 2023-06-02 DIAGNOSIS — Z981 Arthrodesis status: Secondary | ICD-10-CM | POA: Diagnosis not present

## 2023-06-02 DIAGNOSIS — K5790 Diverticulosis of intestine, part unspecified, without perforation or abscess without bleeding: Secondary | ICD-10-CM | POA: Diagnosis not present

## 2023-06-02 DIAGNOSIS — M160 Bilateral primary osteoarthritis of hip: Secondary | ICD-10-CM | POA: Diagnosis not present

## 2023-06-02 DIAGNOSIS — Z7951 Long term (current) use of inhaled steroids: Secondary | ICD-10-CM | POA: Diagnosis not present

## 2023-06-02 DIAGNOSIS — M7061 Trochanteric bursitis, right hip: Secondary | ICD-10-CM | POA: Diagnosis not present

## 2023-06-02 DIAGNOSIS — C679 Malignant neoplasm of bladder, unspecified: Secondary | ICD-10-CM | POA: Diagnosis not present

## 2023-06-02 DIAGNOSIS — M5116 Intervertebral disc disorders with radiculopathy, lumbar region: Secondary | ICD-10-CM | POA: Diagnosis not present

## 2023-06-02 DIAGNOSIS — M25559 Pain in unspecified hip: Secondary | ICD-10-CM | POA: Diagnosis not present

## 2023-06-02 DIAGNOSIS — J4489 Other specified chronic obstructive pulmonary disease: Secondary | ICD-10-CM | POA: Diagnosis not present

## 2023-06-02 DIAGNOSIS — E119 Type 2 diabetes mellitus without complications: Secondary | ICD-10-CM | POA: Diagnosis not present

## 2023-06-02 DIAGNOSIS — Z7952 Long term (current) use of systemic steroids: Secondary | ICD-10-CM | POA: Diagnosis not present

## 2023-06-02 DIAGNOSIS — I2510 Atherosclerotic heart disease of native coronary artery without angina pectoris: Secondary | ICD-10-CM | POA: Diagnosis not present

## 2023-06-02 DIAGNOSIS — I10 Essential (primary) hypertension: Secondary | ICD-10-CM | POA: Diagnosis not present

## 2023-06-02 DIAGNOSIS — Z7982 Long term (current) use of aspirin: Secondary | ICD-10-CM | POA: Diagnosis not present

## 2023-06-02 DIAGNOSIS — G3184 Mild cognitive impairment, so stated: Secondary | ICD-10-CM | POA: Diagnosis not present

## 2023-06-02 DIAGNOSIS — K219 Gastro-esophageal reflux disease without esophagitis: Secondary | ICD-10-CM | POA: Diagnosis not present

## 2023-06-02 DIAGNOSIS — K449 Diaphragmatic hernia without obstruction or gangrene: Secondary | ICD-10-CM | POA: Diagnosis not present

## 2023-06-02 DIAGNOSIS — I350 Nonrheumatic aortic (valve) stenosis: Secondary | ICD-10-CM | POA: Diagnosis not present

## 2023-06-02 DIAGNOSIS — M7062 Trochanteric bursitis, left hip: Secondary | ICD-10-CM | POA: Diagnosis not present

## 2023-06-02 DIAGNOSIS — C451 Mesothelioma of peritoneum: Secondary | ICD-10-CM | POA: Diagnosis not present

## 2023-06-02 DIAGNOSIS — G4700 Insomnia, unspecified: Secondary | ICD-10-CM | POA: Diagnosis not present

## 2023-06-02 DIAGNOSIS — Z7985 Long-term (current) use of injectable non-insulin antidiabetic drugs: Secondary | ICD-10-CM | POA: Diagnosis not present

## 2023-06-02 DIAGNOSIS — Z8551 Personal history of malignant neoplasm of bladder: Secondary | ICD-10-CM | POA: Diagnosis not present

## 2023-06-02 DIAGNOSIS — G8929 Other chronic pain: Secondary | ICD-10-CM | POA: Diagnosis not present

## 2023-06-02 DIAGNOSIS — Z79899 Other long term (current) drug therapy: Secondary | ICD-10-CM | POA: Diagnosis not present

## 2023-06-02 DIAGNOSIS — R9389 Abnormal findings on diagnostic imaging of other specified body structures: Secondary | ICD-10-CM | POA: Diagnosis not present

## 2023-06-02 DIAGNOSIS — G4733 Obstructive sleep apnea (adult) (pediatric): Secondary | ICD-10-CM | POA: Diagnosis not present

## 2023-06-02 DIAGNOSIS — N179 Acute kidney failure, unspecified: Secondary | ICD-10-CM | POA: Diagnosis not present

## 2023-06-05 DIAGNOSIS — I350 Nonrheumatic aortic (valve) stenosis: Secondary | ICD-10-CM | POA: Diagnosis not present

## 2023-06-05 DIAGNOSIS — K219 Gastro-esophageal reflux disease without esophagitis: Secondary | ICD-10-CM | POA: Diagnosis not present

## 2023-06-05 DIAGNOSIS — G3184 Mild cognitive impairment, so stated: Secondary | ICD-10-CM | POA: Diagnosis not present

## 2023-06-05 DIAGNOSIS — M5116 Intervertebral disc disorders with radiculopathy, lumbar region: Secondary | ICD-10-CM | POA: Diagnosis not present

## 2023-06-05 DIAGNOSIS — Z8551 Personal history of malignant neoplasm of bladder: Secondary | ICD-10-CM | POA: Diagnosis not present

## 2023-06-05 DIAGNOSIS — G4733 Obstructive sleep apnea (adult) (pediatric): Secondary | ICD-10-CM | POA: Diagnosis not present

## 2023-06-05 DIAGNOSIS — M7061 Trochanteric bursitis, right hip: Secondary | ICD-10-CM | POA: Diagnosis not present

## 2023-06-05 DIAGNOSIS — K5790 Diverticulosis of intestine, part unspecified, without perforation or abscess without bleeding: Secondary | ICD-10-CM | POA: Diagnosis not present

## 2023-06-05 DIAGNOSIS — Z7952 Long term (current) use of systemic steroids: Secondary | ICD-10-CM | POA: Diagnosis not present

## 2023-06-05 DIAGNOSIS — Z7982 Long term (current) use of aspirin: Secondary | ICD-10-CM | POA: Diagnosis not present

## 2023-06-05 DIAGNOSIS — E119 Type 2 diabetes mellitus without complications: Secondary | ICD-10-CM | POA: Diagnosis not present

## 2023-06-05 DIAGNOSIS — G8929 Other chronic pain: Secondary | ICD-10-CM | POA: Diagnosis not present

## 2023-06-05 DIAGNOSIS — D509 Iron deficiency anemia, unspecified: Secondary | ICD-10-CM | POA: Diagnosis not present

## 2023-06-05 DIAGNOSIS — Z7951 Long term (current) use of inhaled steroids: Secondary | ICD-10-CM | POA: Diagnosis not present

## 2023-06-05 DIAGNOSIS — M160 Bilateral primary osteoarthritis of hip: Secondary | ICD-10-CM | POA: Diagnosis not present

## 2023-06-05 DIAGNOSIS — M7062 Trochanteric bursitis, left hip: Secondary | ICD-10-CM | POA: Diagnosis not present

## 2023-06-05 DIAGNOSIS — I10 Essential (primary) hypertension: Secondary | ICD-10-CM | POA: Diagnosis not present

## 2023-06-05 DIAGNOSIS — Z981 Arthrodesis status: Secondary | ICD-10-CM | POA: Diagnosis not present

## 2023-06-05 DIAGNOSIS — K449 Diaphragmatic hernia without obstruction or gangrene: Secondary | ICD-10-CM | POA: Diagnosis not present

## 2023-06-05 DIAGNOSIS — J4489 Other specified chronic obstructive pulmonary disease: Secondary | ICD-10-CM | POA: Diagnosis not present

## 2023-06-05 DIAGNOSIS — G4700 Insomnia, unspecified: Secondary | ICD-10-CM | POA: Diagnosis not present

## 2023-06-05 DIAGNOSIS — E7800 Pure hypercholesterolemia, unspecified: Secondary | ICD-10-CM | POA: Diagnosis not present

## 2023-06-05 DIAGNOSIS — I2510 Atherosclerotic heart disease of native coronary artery without angina pectoris: Secondary | ICD-10-CM | POA: Diagnosis not present

## 2023-06-05 DIAGNOSIS — Z7985 Long-term (current) use of injectable non-insulin antidiabetic drugs: Secondary | ICD-10-CM | POA: Diagnosis not present

## 2023-06-06 DIAGNOSIS — I350 Nonrheumatic aortic (valve) stenosis: Secondary | ICD-10-CM | POA: Diagnosis not present

## 2023-06-06 DIAGNOSIS — J4489 Other specified chronic obstructive pulmonary disease: Secondary | ICD-10-CM | POA: Diagnosis not present

## 2023-06-06 DIAGNOSIS — K449 Diaphragmatic hernia without obstruction or gangrene: Secondary | ICD-10-CM | POA: Diagnosis not present

## 2023-06-06 DIAGNOSIS — I2510 Atherosclerotic heart disease of native coronary artery without angina pectoris: Secondary | ICD-10-CM | POA: Diagnosis not present

## 2023-06-06 DIAGNOSIS — E119 Type 2 diabetes mellitus without complications: Secondary | ICD-10-CM | POA: Diagnosis not present

## 2023-06-06 DIAGNOSIS — Z7982 Long term (current) use of aspirin: Secondary | ICD-10-CM | POA: Diagnosis not present

## 2023-06-06 DIAGNOSIS — M160 Bilateral primary osteoarthritis of hip: Secondary | ICD-10-CM | POA: Diagnosis not present

## 2023-06-06 DIAGNOSIS — G3184 Mild cognitive impairment, so stated: Secondary | ICD-10-CM | POA: Diagnosis not present

## 2023-06-06 DIAGNOSIS — Z981 Arthrodesis status: Secondary | ICD-10-CM | POA: Diagnosis not present

## 2023-06-06 DIAGNOSIS — Z8551 Personal history of malignant neoplasm of bladder: Secondary | ICD-10-CM | POA: Diagnosis not present

## 2023-06-06 DIAGNOSIS — I10 Essential (primary) hypertension: Secondary | ICD-10-CM | POA: Diagnosis not present

## 2023-06-06 DIAGNOSIS — Z7951 Long term (current) use of inhaled steroids: Secondary | ICD-10-CM | POA: Diagnosis not present

## 2023-06-06 DIAGNOSIS — M5116 Intervertebral disc disorders with radiculopathy, lumbar region: Secondary | ICD-10-CM | POA: Diagnosis not present

## 2023-06-06 DIAGNOSIS — K5790 Diverticulosis of intestine, part unspecified, without perforation or abscess without bleeding: Secondary | ICD-10-CM | POA: Diagnosis not present

## 2023-06-06 DIAGNOSIS — D509 Iron deficiency anemia, unspecified: Secondary | ICD-10-CM | POA: Diagnosis not present

## 2023-06-06 DIAGNOSIS — G8929 Other chronic pain: Secondary | ICD-10-CM | POA: Diagnosis not present

## 2023-06-06 DIAGNOSIS — Z7952 Long term (current) use of systemic steroids: Secondary | ICD-10-CM | POA: Diagnosis not present

## 2023-06-06 DIAGNOSIS — Z7985 Long-term (current) use of injectable non-insulin antidiabetic drugs: Secondary | ICD-10-CM | POA: Diagnosis not present

## 2023-06-06 DIAGNOSIS — M7062 Trochanteric bursitis, left hip: Secondary | ICD-10-CM | POA: Diagnosis not present

## 2023-06-06 DIAGNOSIS — M7061 Trochanteric bursitis, right hip: Secondary | ICD-10-CM | POA: Diagnosis not present

## 2023-06-06 DIAGNOSIS — M533 Sacrococcygeal disorders, not elsewhere classified: Secondary | ICD-10-CM | POA: Diagnosis not present

## 2023-06-06 DIAGNOSIS — G4733 Obstructive sleep apnea (adult) (pediatric): Secondary | ICD-10-CM | POA: Diagnosis not present

## 2023-06-06 DIAGNOSIS — K219 Gastro-esophageal reflux disease without esophagitis: Secondary | ICD-10-CM | POA: Diagnosis not present

## 2023-06-06 DIAGNOSIS — E7800 Pure hypercholesterolemia, unspecified: Secondary | ICD-10-CM | POA: Diagnosis not present

## 2023-06-06 DIAGNOSIS — G4700 Insomnia, unspecified: Secondary | ICD-10-CM | POA: Diagnosis not present

## 2023-06-08 ENCOUNTER — Other Ambulatory Visit: Payer: Self-pay | Admitting: Cardiology

## 2023-06-08 DIAGNOSIS — Z981 Arthrodesis status: Secondary | ICD-10-CM | POA: Diagnosis not present

## 2023-06-08 DIAGNOSIS — M5116 Intervertebral disc disorders with radiculopathy, lumbar region: Secondary | ICD-10-CM | POA: Diagnosis not present

## 2023-06-08 DIAGNOSIS — M5416 Radiculopathy, lumbar region: Secondary | ICD-10-CM | POA: Diagnosis not present

## 2023-06-09 DIAGNOSIS — E119 Type 2 diabetes mellitus without complications: Secondary | ICD-10-CM | POA: Diagnosis not present

## 2023-06-09 DIAGNOSIS — M160 Bilateral primary osteoarthritis of hip: Secondary | ICD-10-CM | POA: Diagnosis not present

## 2023-06-09 DIAGNOSIS — Z7952 Long term (current) use of systemic steroids: Secondary | ICD-10-CM | POA: Diagnosis not present

## 2023-06-09 DIAGNOSIS — J4489 Other specified chronic obstructive pulmonary disease: Secondary | ICD-10-CM | POA: Diagnosis not present

## 2023-06-09 DIAGNOSIS — G8929 Other chronic pain: Secondary | ICD-10-CM | POA: Diagnosis not present

## 2023-06-09 DIAGNOSIS — G3184 Mild cognitive impairment, so stated: Secondary | ICD-10-CM | POA: Diagnosis not present

## 2023-06-09 DIAGNOSIS — Z8551 Personal history of malignant neoplasm of bladder: Secondary | ICD-10-CM | POA: Diagnosis not present

## 2023-06-09 DIAGNOSIS — M7061 Trochanteric bursitis, right hip: Secondary | ICD-10-CM | POA: Diagnosis not present

## 2023-06-09 DIAGNOSIS — Z7985 Long-term (current) use of injectable non-insulin antidiabetic drugs: Secondary | ICD-10-CM | POA: Diagnosis not present

## 2023-06-09 DIAGNOSIS — K5790 Diverticulosis of intestine, part unspecified, without perforation or abscess without bleeding: Secondary | ICD-10-CM | POA: Diagnosis not present

## 2023-06-09 DIAGNOSIS — E7800 Pure hypercholesterolemia, unspecified: Secondary | ICD-10-CM | POA: Diagnosis not present

## 2023-06-09 DIAGNOSIS — Z7982 Long term (current) use of aspirin: Secondary | ICD-10-CM | POA: Diagnosis not present

## 2023-06-09 DIAGNOSIS — Z7951 Long term (current) use of inhaled steroids: Secondary | ICD-10-CM | POA: Diagnosis not present

## 2023-06-09 DIAGNOSIS — K449 Diaphragmatic hernia without obstruction or gangrene: Secondary | ICD-10-CM | POA: Diagnosis not present

## 2023-06-09 DIAGNOSIS — K219 Gastro-esophageal reflux disease without esophagitis: Secondary | ICD-10-CM | POA: Diagnosis not present

## 2023-06-09 DIAGNOSIS — I350 Nonrheumatic aortic (valve) stenosis: Secondary | ICD-10-CM | POA: Diagnosis not present

## 2023-06-09 DIAGNOSIS — Z981 Arthrodesis status: Secondary | ICD-10-CM | POA: Diagnosis not present

## 2023-06-09 DIAGNOSIS — G4733 Obstructive sleep apnea (adult) (pediatric): Secondary | ICD-10-CM | POA: Diagnosis not present

## 2023-06-09 DIAGNOSIS — D509 Iron deficiency anemia, unspecified: Secondary | ICD-10-CM | POA: Diagnosis not present

## 2023-06-09 DIAGNOSIS — M5116 Intervertebral disc disorders with radiculopathy, lumbar region: Secondary | ICD-10-CM | POA: Diagnosis not present

## 2023-06-09 DIAGNOSIS — G4700 Insomnia, unspecified: Secondary | ICD-10-CM | POA: Diagnosis not present

## 2023-06-09 DIAGNOSIS — I10 Essential (primary) hypertension: Secondary | ICD-10-CM | POA: Diagnosis not present

## 2023-06-09 DIAGNOSIS — I2510 Atherosclerotic heart disease of native coronary artery without angina pectoris: Secondary | ICD-10-CM | POA: Diagnosis not present

## 2023-06-09 DIAGNOSIS — M7062 Trochanteric bursitis, left hip: Secondary | ICD-10-CM | POA: Diagnosis not present

## 2023-06-14 DIAGNOSIS — L601 Onycholysis: Secondary | ICD-10-CM | POA: Diagnosis not present

## 2023-06-14 DIAGNOSIS — B372 Candidiasis of skin and nail: Secondary | ICD-10-CM | POA: Diagnosis not present

## 2023-06-15 DIAGNOSIS — M961 Postlaminectomy syndrome, not elsewhere classified: Secondary | ICD-10-CM | POA: Diagnosis not present

## 2023-06-15 DIAGNOSIS — M533 Sacrococcygeal disorders, not elsewhere classified: Secondary | ICD-10-CM | POA: Diagnosis not present

## 2023-06-15 DIAGNOSIS — M5416 Radiculopathy, lumbar region: Secondary | ICD-10-CM | POA: Diagnosis not present

## 2023-06-16 DIAGNOSIS — J4489 Other specified chronic obstructive pulmonary disease: Secondary | ICD-10-CM | POA: Diagnosis not present

## 2023-06-16 DIAGNOSIS — G3184 Mild cognitive impairment, so stated: Secondary | ICD-10-CM | POA: Diagnosis not present

## 2023-06-16 DIAGNOSIS — G4733 Obstructive sleep apnea (adult) (pediatric): Secondary | ICD-10-CM | POA: Diagnosis not present

## 2023-06-16 DIAGNOSIS — G4700 Insomnia, unspecified: Secondary | ICD-10-CM | POA: Diagnosis not present

## 2023-06-16 DIAGNOSIS — Z7985 Long-term (current) use of injectable non-insulin antidiabetic drugs: Secondary | ICD-10-CM | POA: Diagnosis not present

## 2023-06-16 DIAGNOSIS — M7061 Trochanteric bursitis, right hip: Secondary | ICD-10-CM | POA: Diagnosis not present

## 2023-06-16 DIAGNOSIS — K5790 Diverticulosis of intestine, part unspecified, without perforation or abscess without bleeding: Secondary | ICD-10-CM | POA: Diagnosis not present

## 2023-06-16 DIAGNOSIS — E7800 Pure hypercholesterolemia, unspecified: Secondary | ICD-10-CM | POA: Diagnosis not present

## 2023-06-16 DIAGNOSIS — Z7982 Long term (current) use of aspirin: Secondary | ICD-10-CM | POA: Diagnosis not present

## 2023-06-16 DIAGNOSIS — Z8551 Personal history of malignant neoplasm of bladder: Secondary | ICD-10-CM | POA: Diagnosis not present

## 2023-06-16 DIAGNOSIS — M7062 Trochanteric bursitis, left hip: Secondary | ICD-10-CM | POA: Diagnosis not present

## 2023-06-16 DIAGNOSIS — I350 Nonrheumatic aortic (valve) stenosis: Secondary | ICD-10-CM | POA: Diagnosis not present

## 2023-06-16 DIAGNOSIS — M160 Bilateral primary osteoarthritis of hip: Secondary | ICD-10-CM | POA: Diagnosis not present

## 2023-06-16 DIAGNOSIS — K449 Diaphragmatic hernia without obstruction or gangrene: Secondary | ICD-10-CM | POA: Diagnosis not present

## 2023-06-16 DIAGNOSIS — E119 Type 2 diabetes mellitus without complications: Secondary | ICD-10-CM | POA: Diagnosis not present

## 2023-06-16 DIAGNOSIS — Z7952 Long term (current) use of systemic steroids: Secondary | ICD-10-CM | POA: Diagnosis not present

## 2023-06-16 DIAGNOSIS — Z7951 Long term (current) use of inhaled steroids: Secondary | ICD-10-CM | POA: Diagnosis not present

## 2023-06-16 DIAGNOSIS — M5116 Intervertebral disc disorders with radiculopathy, lumbar region: Secondary | ICD-10-CM | POA: Diagnosis not present

## 2023-06-16 DIAGNOSIS — I2510 Atherosclerotic heart disease of native coronary artery without angina pectoris: Secondary | ICD-10-CM | POA: Diagnosis not present

## 2023-06-16 DIAGNOSIS — G8929 Other chronic pain: Secondary | ICD-10-CM | POA: Diagnosis not present

## 2023-06-16 DIAGNOSIS — Z981 Arthrodesis status: Secondary | ICD-10-CM | POA: Diagnosis not present

## 2023-06-16 DIAGNOSIS — D509 Iron deficiency anemia, unspecified: Secondary | ICD-10-CM | POA: Diagnosis not present

## 2023-06-16 DIAGNOSIS — K219 Gastro-esophageal reflux disease without esophagitis: Secondary | ICD-10-CM | POA: Diagnosis not present

## 2023-06-16 DIAGNOSIS — I10 Essential (primary) hypertension: Secondary | ICD-10-CM | POA: Diagnosis not present

## 2023-06-20 DIAGNOSIS — E119 Type 2 diabetes mellitus without complications: Secondary | ICD-10-CM | POA: Diagnosis not present

## 2023-06-20 DIAGNOSIS — Z7951 Long term (current) use of inhaled steroids: Secondary | ICD-10-CM | POA: Diagnosis not present

## 2023-06-20 DIAGNOSIS — G4733 Obstructive sleep apnea (adult) (pediatric): Secondary | ICD-10-CM | POA: Diagnosis not present

## 2023-06-20 DIAGNOSIS — G8929 Other chronic pain: Secondary | ICD-10-CM | POA: Diagnosis not present

## 2023-06-20 DIAGNOSIS — Z7982 Long term (current) use of aspirin: Secondary | ICD-10-CM | POA: Diagnosis not present

## 2023-06-20 DIAGNOSIS — Z8551 Personal history of malignant neoplasm of bladder: Secondary | ICD-10-CM | POA: Diagnosis not present

## 2023-06-20 DIAGNOSIS — K449 Diaphragmatic hernia without obstruction or gangrene: Secondary | ICD-10-CM | POA: Diagnosis not present

## 2023-06-20 DIAGNOSIS — D509 Iron deficiency anemia, unspecified: Secondary | ICD-10-CM | POA: Diagnosis not present

## 2023-06-20 DIAGNOSIS — G3184 Mild cognitive impairment, so stated: Secondary | ICD-10-CM | POA: Diagnosis not present

## 2023-06-20 DIAGNOSIS — M5116 Intervertebral disc disorders with radiculopathy, lumbar region: Secondary | ICD-10-CM | POA: Diagnosis not present

## 2023-06-20 DIAGNOSIS — Z7952 Long term (current) use of systemic steroids: Secondary | ICD-10-CM | POA: Diagnosis not present

## 2023-06-20 DIAGNOSIS — I350 Nonrheumatic aortic (valve) stenosis: Secondary | ICD-10-CM | POA: Diagnosis not present

## 2023-06-20 DIAGNOSIS — K5790 Diverticulosis of intestine, part unspecified, without perforation or abscess without bleeding: Secondary | ICD-10-CM | POA: Diagnosis not present

## 2023-06-20 DIAGNOSIS — I10 Essential (primary) hypertension: Secondary | ICD-10-CM | POA: Diagnosis not present

## 2023-06-20 DIAGNOSIS — M7061 Trochanteric bursitis, right hip: Secondary | ICD-10-CM | POA: Diagnosis not present

## 2023-06-20 DIAGNOSIS — J4489 Other specified chronic obstructive pulmonary disease: Secondary | ICD-10-CM | POA: Diagnosis not present

## 2023-06-20 DIAGNOSIS — E7800 Pure hypercholesterolemia, unspecified: Secondary | ICD-10-CM | POA: Diagnosis not present

## 2023-06-20 DIAGNOSIS — M160 Bilateral primary osteoarthritis of hip: Secondary | ICD-10-CM | POA: Diagnosis not present

## 2023-06-20 DIAGNOSIS — K219 Gastro-esophageal reflux disease without esophagitis: Secondary | ICD-10-CM | POA: Diagnosis not present

## 2023-06-20 DIAGNOSIS — Z7985 Long-term (current) use of injectable non-insulin antidiabetic drugs: Secondary | ICD-10-CM | POA: Diagnosis not present

## 2023-06-20 DIAGNOSIS — Z981 Arthrodesis status: Secondary | ICD-10-CM | POA: Diagnosis not present

## 2023-06-20 DIAGNOSIS — M7062 Trochanteric bursitis, left hip: Secondary | ICD-10-CM | POA: Diagnosis not present

## 2023-06-20 DIAGNOSIS — G4700 Insomnia, unspecified: Secondary | ICD-10-CM | POA: Diagnosis not present

## 2023-06-20 DIAGNOSIS — I2510 Atherosclerotic heart disease of native coronary artery without angina pectoris: Secondary | ICD-10-CM | POA: Diagnosis not present

## 2023-06-22 DIAGNOSIS — M5416 Radiculopathy, lumbar region: Secondary | ICD-10-CM | POA: Diagnosis not present

## 2023-06-22 DIAGNOSIS — R5381 Other malaise: Secondary | ICD-10-CM | POA: Diagnosis not present

## 2023-06-22 DIAGNOSIS — R52 Pain, unspecified: Secondary | ICD-10-CM | POA: Diagnosis not present

## 2023-06-22 DIAGNOSIS — R6883 Chills (without fever): Secondary | ICD-10-CM | POA: Diagnosis not present

## 2023-06-23 DIAGNOSIS — Z7951 Long term (current) use of inhaled steroids: Secondary | ICD-10-CM | POA: Diagnosis not present

## 2023-06-23 DIAGNOSIS — M160 Bilateral primary osteoarthritis of hip: Secondary | ICD-10-CM | POA: Diagnosis not present

## 2023-06-23 DIAGNOSIS — Z981 Arthrodesis status: Secondary | ICD-10-CM | POA: Diagnosis not present

## 2023-06-23 DIAGNOSIS — M5116 Intervertebral disc disorders with radiculopathy, lumbar region: Secondary | ICD-10-CM | POA: Diagnosis not present

## 2023-06-23 DIAGNOSIS — K5790 Diverticulosis of intestine, part unspecified, without perforation or abscess without bleeding: Secondary | ICD-10-CM | POA: Diagnosis not present

## 2023-06-23 DIAGNOSIS — E119 Type 2 diabetes mellitus without complications: Secondary | ICD-10-CM | POA: Diagnosis not present

## 2023-06-23 DIAGNOSIS — D509 Iron deficiency anemia, unspecified: Secondary | ICD-10-CM | POA: Diagnosis not present

## 2023-06-23 DIAGNOSIS — M7062 Trochanteric bursitis, left hip: Secondary | ICD-10-CM | POA: Diagnosis not present

## 2023-06-23 DIAGNOSIS — I2510 Atherosclerotic heart disease of native coronary artery without angina pectoris: Secondary | ICD-10-CM | POA: Diagnosis not present

## 2023-06-23 DIAGNOSIS — E7800 Pure hypercholesterolemia, unspecified: Secondary | ICD-10-CM | POA: Diagnosis not present

## 2023-06-23 DIAGNOSIS — Z7985 Long-term (current) use of injectable non-insulin antidiabetic drugs: Secondary | ICD-10-CM | POA: Diagnosis not present

## 2023-06-23 DIAGNOSIS — J4489 Other specified chronic obstructive pulmonary disease: Secondary | ICD-10-CM | POA: Diagnosis not present

## 2023-06-23 DIAGNOSIS — G3184 Mild cognitive impairment, so stated: Secondary | ICD-10-CM | POA: Diagnosis not present

## 2023-06-23 DIAGNOSIS — I350 Nonrheumatic aortic (valve) stenosis: Secondary | ICD-10-CM | POA: Diagnosis not present

## 2023-06-23 DIAGNOSIS — K449 Diaphragmatic hernia without obstruction or gangrene: Secondary | ICD-10-CM | POA: Diagnosis not present

## 2023-06-23 DIAGNOSIS — G8929 Other chronic pain: Secondary | ICD-10-CM | POA: Diagnosis not present

## 2023-06-23 DIAGNOSIS — G4700 Insomnia, unspecified: Secondary | ICD-10-CM | POA: Diagnosis not present

## 2023-06-23 DIAGNOSIS — G4733 Obstructive sleep apnea (adult) (pediatric): Secondary | ICD-10-CM | POA: Diagnosis not present

## 2023-06-23 DIAGNOSIS — Z7982 Long term (current) use of aspirin: Secondary | ICD-10-CM | POA: Diagnosis not present

## 2023-06-23 DIAGNOSIS — Z7952 Long term (current) use of systemic steroids: Secondary | ICD-10-CM | POA: Diagnosis not present

## 2023-06-23 DIAGNOSIS — M7061 Trochanteric bursitis, right hip: Secondary | ICD-10-CM | POA: Diagnosis not present

## 2023-06-23 DIAGNOSIS — Z8551 Personal history of malignant neoplasm of bladder: Secondary | ICD-10-CM | POA: Diagnosis not present

## 2023-06-23 DIAGNOSIS — K219 Gastro-esophageal reflux disease without esophagitis: Secondary | ICD-10-CM | POA: Diagnosis not present

## 2023-06-23 DIAGNOSIS — I10 Essential (primary) hypertension: Secondary | ICD-10-CM | POA: Diagnosis not present

## 2023-06-26 DIAGNOSIS — J441 Chronic obstructive pulmonary disease with (acute) exacerbation: Secondary | ICD-10-CM | POA: Diagnosis not present

## 2023-06-26 DIAGNOSIS — G894 Chronic pain syndrome: Secondary | ICD-10-CM | POA: Diagnosis not present

## 2023-06-26 DIAGNOSIS — I10 Essential (primary) hypertension: Secondary | ICD-10-CM | POA: Diagnosis not present

## 2023-06-26 DIAGNOSIS — R053 Chronic cough: Secondary | ICD-10-CM | POA: Diagnosis not present

## 2023-06-26 DIAGNOSIS — K219 Gastro-esophageal reflux disease without esophagitis: Secondary | ICD-10-CM | POA: Diagnosis not present

## 2023-06-26 DIAGNOSIS — K649 Unspecified hemorrhoids: Secondary | ICD-10-CM | POA: Diagnosis not present

## 2023-06-27 ENCOUNTER — Ambulatory Visit: Payer: 59 | Admitting: Neurology

## 2023-06-28 DIAGNOSIS — I889 Nonspecific lymphadenitis, unspecified: Secondary | ICD-10-CM | POA: Diagnosis not present

## 2023-06-29 DIAGNOSIS — S32009K Unspecified fracture of unspecified lumbar vertebra, subsequent encounter for fracture with nonunion: Secondary | ICD-10-CM | POA: Diagnosis not present

## 2023-06-29 DIAGNOSIS — M5116 Intervertebral disc disorders with radiculopathy, lumbar region: Secondary | ICD-10-CM | POA: Diagnosis not present

## 2023-06-29 DIAGNOSIS — M4726 Other spondylosis with radiculopathy, lumbar region: Secondary | ICD-10-CM | POA: Diagnosis not present

## 2023-07-04 DIAGNOSIS — E559 Vitamin D deficiency, unspecified: Secondary | ICD-10-CM | POA: Diagnosis not present

## 2023-07-04 DIAGNOSIS — I10 Essential (primary) hypertension: Secondary | ICD-10-CM | POA: Diagnosis not present

## 2023-07-04 DIAGNOSIS — K219 Gastro-esophageal reflux disease without esophagitis: Secondary | ICD-10-CM | POA: Diagnosis not present

## 2023-07-04 DIAGNOSIS — Z79899 Other long term (current) drug therapy: Secondary | ICD-10-CM | POA: Diagnosis not present

## 2023-07-04 DIAGNOSIS — M1990 Unspecified osteoarthritis, unspecified site: Secondary | ICD-10-CM | POA: Diagnosis not present

## 2023-07-04 DIAGNOSIS — R2681 Unsteadiness on feet: Secondary | ICD-10-CM | POA: Diagnosis not present

## 2023-07-04 DIAGNOSIS — K649 Unspecified hemorrhoids: Secondary | ICD-10-CM | POA: Diagnosis not present

## 2023-07-04 DIAGNOSIS — M159 Polyosteoarthritis, unspecified: Secondary | ICD-10-CM | POA: Diagnosis not present

## 2023-07-04 DIAGNOSIS — G894 Chronic pain syndrome: Secondary | ICD-10-CM | POA: Diagnosis not present

## 2023-07-04 DIAGNOSIS — R053 Chronic cough: Secondary | ICD-10-CM | POA: Diagnosis not present

## 2023-07-06 ENCOUNTER — Ambulatory Visit: Payer: 59 | Admitting: Neurology

## 2023-07-06 DIAGNOSIS — Z8551 Personal history of malignant neoplasm of bladder: Secondary | ICD-10-CM | POA: Diagnosis not present

## 2023-07-06 DIAGNOSIS — R8289 Other abnormal findings on cytological and histological examination of urine: Secondary | ICD-10-CM | POA: Diagnosis not present

## 2023-07-11 ENCOUNTER — Telehealth: Payer: Self-pay | Admitting: Nurse Practitioner

## 2023-07-11 NOTE — Telephone Encounter (Signed)
 Inbound call from [facility] pharmacy, requesting refill for famotidine for patient.

## 2023-07-12 MED ORDER — FAMOTIDINE 40 MG PO TABS: 40.0000 mg | ORAL_TABLET | Freq: Two times a day (BID) | ORAL | 11 refills | Status: DC

## 2023-07-12 NOTE — Telephone Encounter (Signed)
 Refill has been sent to [facility].

## 2023-07-13 DIAGNOSIS — J301 Allergic rhinitis due to pollen: Secondary | ICD-10-CM | POA: Diagnosis not present

## 2023-07-14 DIAGNOSIS — J301 Allergic rhinitis due to pollen: Secondary | ICD-10-CM | POA: Diagnosis not present

## 2023-07-18 DIAGNOSIS — M71551 Other bursitis, not elsewhere classified, right hip: Secondary | ICD-10-CM | POA: Diagnosis not present

## 2023-07-21 DIAGNOSIS — R011 Cardiac murmur, unspecified: Secondary | ICD-10-CM | POA: Diagnosis not present

## 2023-07-21 DIAGNOSIS — Z122 Encounter for screening for malignant neoplasm of respiratory organs: Secondary | ICD-10-CM | POA: Diagnosis not present

## 2023-07-21 DIAGNOSIS — R918 Other nonspecific abnormal finding of lung field: Secondary | ICD-10-CM | POA: Diagnosis not present

## 2023-07-21 DIAGNOSIS — Z87891 Personal history of nicotine dependence: Secondary | ICD-10-CM | POA: Diagnosis not present

## 2023-07-21 DIAGNOSIS — I158 Other secondary hypertension: Secondary | ICD-10-CM | POA: Diagnosis not present

## 2023-07-21 DIAGNOSIS — D509 Iron deficiency anemia, unspecified: Secondary | ICD-10-CM | POA: Diagnosis not present

## 2023-07-21 DIAGNOSIS — J449 Chronic obstructive pulmonary disease, unspecified: Secondary | ICD-10-CM | POA: Diagnosis not present

## 2023-07-21 DIAGNOSIS — G4733 Obstructive sleep apnea (adult) (pediatric): Secondary | ICD-10-CM | POA: Diagnosis not present

## 2023-07-21 DIAGNOSIS — E088 Diabetes mellitus due to underlying condition with unspecified complications: Secondary | ICD-10-CM | POA: Diagnosis not present

## 2023-07-22 DIAGNOSIS — Z122 Encounter for screening for malignant neoplasm of respiratory organs: Secondary | ICD-10-CM | POA: Diagnosis not present

## 2023-07-22 DIAGNOSIS — Z87891 Personal history of nicotine dependence: Secondary | ICD-10-CM | POA: Diagnosis not present

## 2023-07-24 DIAGNOSIS — R296 Repeated falls: Secondary | ICD-10-CM | POA: Diagnosis not present

## 2023-07-24 DIAGNOSIS — R2689 Other abnormalities of gait and mobility: Secondary | ICD-10-CM | POA: Diagnosis not present

## 2023-07-24 DIAGNOSIS — R2681 Unsteadiness on feet: Secondary | ICD-10-CM | POA: Diagnosis not present

## 2023-07-24 DIAGNOSIS — M62551 Muscle wasting and atrophy, not elsewhere classified, right thigh: Secondary | ICD-10-CM | POA: Diagnosis not present

## 2023-07-26 DIAGNOSIS — C451 Mesothelioma of peritoneum: Secondary | ICD-10-CM | POA: Diagnosis not present

## 2023-07-28 DIAGNOSIS — R053 Chronic cough: Secondary | ICD-10-CM | POA: Diagnosis not present

## 2023-07-28 DIAGNOSIS — R1310 Dysphagia, unspecified: Secondary | ICD-10-CM | POA: Diagnosis not present

## 2023-08-02 DIAGNOSIS — K219 Gastro-esophageal reflux disease without esophagitis: Secondary | ICD-10-CM | POA: Diagnosis not present

## 2023-08-02 DIAGNOSIS — J4489 Other specified chronic obstructive pulmonary disease: Secondary | ICD-10-CM | POA: Diagnosis not present

## 2023-08-02 DIAGNOSIS — S32009K Unspecified fracture of unspecified lumbar vertebra, subsequent encounter for fracture with nonunion: Secondary | ICD-10-CM | POA: Diagnosis not present

## 2023-08-02 DIAGNOSIS — M48061 Spinal stenosis, lumbar region without neurogenic claudication: Secondary | ICD-10-CM | POA: Diagnosis not present

## 2023-08-02 DIAGNOSIS — G4733 Obstructive sleep apnea (adult) (pediatric): Secondary | ICD-10-CM | POA: Diagnosis not present

## 2023-08-02 DIAGNOSIS — I2510 Atherosclerotic heart disease of native coronary artery without angina pectoris: Secondary | ICD-10-CM | POA: Diagnosis not present

## 2023-08-02 DIAGNOSIS — Z7985 Long-term (current) use of injectable non-insulin antidiabetic drugs: Secondary | ICD-10-CM | POA: Diagnosis not present

## 2023-08-02 DIAGNOSIS — E7800 Pure hypercholesterolemia, unspecified: Secondary | ICD-10-CM | POA: Diagnosis not present

## 2023-08-02 DIAGNOSIS — I350 Nonrheumatic aortic (valve) stenosis: Secondary | ICD-10-CM | POA: Diagnosis not present

## 2023-08-02 DIAGNOSIS — I10 Essential (primary) hypertension: Secondary | ICD-10-CM | POA: Diagnosis not present

## 2023-08-02 DIAGNOSIS — M5116 Intervertebral disc disorders with radiculopathy, lumbar region: Secondary | ICD-10-CM | POA: Diagnosis not present

## 2023-08-02 DIAGNOSIS — Z87891 Personal history of nicotine dependence: Secondary | ICD-10-CM | POA: Diagnosis not present

## 2023-08-02 DIAGNOSIS — T84216A Breakdown (mechanical) of internal fixation device of vertebrae, initial encounter: Secondary | ICD-10-CM | POA: Diagnosis not present

## 2023-08-02 DIAGNOSIS — E119 Type 2 diabetes mellitus without complications: Secondary | ICD-10-CM | POA: Diagnosis not present

## 2023-08-02 DIAGNOSIS — M4726 Other spondylosis with radiculopathy, lumbar region: Secondary | ICD-10-CM | POA: Diagnosis not present

## 2023-08-02 DIAGNOSIS — M960 Pseudarthrosis after fusion or arthrodesis: Secondary | ICD-10-CM | POA: Diagnosis not present

## 2023-08-02 DIAGNOSIS — Z7982 Long term (current) use of aspirin: Secondary | ICD-10-CM | POA: Diagnosis not present

## 2023-08-02 DIAGNOSIS — Z981 Arthrodesis status: Secondary | ICD-10-CM | POA: Diagnosis not present

## 2023-08-07 DIAGNOSIS — M4726 Other spondylosis with radiculopathy, lumbar region: Secondary | ICD-10-CM | POA: Diagnosis not present

## 2023-08-07 DIAGNOSIS — T84216D Breakdown (mechanical) of internal fixation device of vertebrae, subsequent encounter: Secondary | ICD-10-CM | POA: Diagnosis not present

## 2023-08-07 DIAGNOSIS — M960 Pseudarthrosis after fusion or arthrodesis: Secondary | ICD-10-CM | POA: Diagnosis not present

## 2023-08-07 DIAGNOSIS — E119 Type 2 diabetes mellitus without complications: Secondary | ICD-10-CM | POA: Diagnosis not present

## 2023-08-07 DIAGNOSIS — M48061 Spinal stenosis, lumbar region without neurogenic claudication: Secondary | ICD-10-CM | POA: Diagnosis not present

## 2023-08-08 ENCOUNTER — Ambulatory Visit: Payer: 59 | Admitting: Physical Therapy

## 2023-08-08 DIAGNOSIS — R531 Weakness: Secondary | ICD-10-CM | POA: Diagnosis not present

## 2023-08-08 DIAGNOSIS — Z9889 Other specified postprocedural states: Secondary | ICD-10-CM | POA: Diagnosis not present

## 2023-08-08 DIAGNOSIS — M4316 Spondylolisthesis, lumbar region: Secondary | ICD-10-CM | POA: Diagnosis not present

## 2023-08-08 DIAGNOSIS — G8918 Other acute postprocedural pain: Secondary | ICD-10-CM | POA: Diagnosis not present

## 2023-08-08 DIAGNOSIS — Z981 Arthrodesis status: Secondary | ICD-10-CM | POA: Diagnosis not present

## 2023-08-09 DIAGNOSIS — M48061 Spinal stenosis, lumbar region without neurogenic claudication: Secondary | ICD-10-CM | POA: Diagnosis not present

## 2023-08-09 DIAGNOSIS — M960 Pseudarthrosis after fusion or arthrodesis: Secondary | ICD-10-CM | POA: Diagnosis not present

## 2023-08-09 DIAGNOSIS — E119 Type 2 diabetes mellitus without complications: Secondary | ICD-10-CM | POA: Diagnosis not present

## 2023-08-09 DIAGNOSIS — M4726 Other spondylosis with radiculopathy, lumbar region: Secondary | ICD-10-CM | POA: Diagnosis not present

## 2023-08-09 DIAGNOSIS — T84216D Breakdown (mechanical) of internal fixation device of vertebrae, subsequent encounter: Secondary | ICD-10-CM | POA: Diagnosis not present

## 2023-08-10 DIAGNOSIS — E119 Type 2 diabetes mellitus without complications: Secondary | ICD-10-CM | POA: Diagnosis not present

## 2023-08-10 DIAGNOSIS — M960 Pseudarthrosis after fusion or arthrodesis: Secondary | ICD-10-CM | POA: Diagnosis not present

## 2023-08-10 DIAGNOSIS — T84216D Breakdown (mechanical) of internal fixation device of vertebrae, subsequent encounter: Secondary | ICD-10-CM | POA: Diagnosis not present

## 2023-08-10 DIAGNOSIS — M4726 Other spondylosis with radiculopathy, lumbar region: Secondary | ICD-10-CM | POA: Diagnosis not present

## 2023-08-10 DIAGNOSIS — M48061 Spinal stenosis, lumbar region without neurogenic claudication: Secondary | ICD-10-CM | POA: Diagnosis not present

## 2023-08-14 DIAGNOSIS — M960 Pseudarthrosis after fusion or arthrodesis: Secondary | ICD-10-CM | POA: Diagnosis not present

## 2023-08-14 DIAGNOSIS — T84216D Breakdown (mechanical) of internal fixation device of vertebrae, subsequent encounter: Secondary | ICD-10-CM | POA: Diagnosis not present

## 2023-08-14 DIAGNOSIS — E119 Type 2 diabetes mellitus without complications: Secondary | ICD-10-CM | POA: Diagnosis not present

## 2023-08-14 DIAGNOSIS — M4726 Other spondylosis with radiculopathy, lumbar region: Secondary | ICD-10-CM | POA: Diagnosis not present

## 2023-08-14 DIAGNOSIS — M48061 Spinal stenosis, lumbar region without neurogenic claudication: Secondary | ICD-10-CM | POA: Diagnosis not present

## 2023-08-15 ENCOUNTER — Ambulatory Visit: Payer: 59 | Admitting: Physical Therapy

## 2023-08-15 DIAGNOSIS — Z4789 Encounter for other orthopedic aftercare: Secondary | ICD-10-CM | POA: Diagnosis not present

## 2023-08-15 DIAGNOSIS — C673 Malignant neoplasm of anterior wall of bladder: Secondary | ICD-10-CM | POA: Diagnosis not present

## 2023-08-15 DIAGNOSIS — Z981 Arthrodesis status: Secondary | ICD-10-CM | POA: Diagnosis not present

## 2023-08-16 DIAGNOSIS — R1310 Dysphagia, unspecified: Secondary | ICD-10-CM | POA: Diagnosis not present

## 2023-08-16 DIAGNOSIS — E119 Type 2 diabetes mellitus without complications: Secondary | ICD-10-CM | POA: Diagnosis not present

## 2023-08-16 DIAGNOSIS — T84216D Breakdown (mechanical) of internal fixation device of vertebrae, subsequent encounter: Secondary | ICD-10-CM | POA: Diagnosis not present

## 2023-08-16 DIAGNOSIS — M48061 Spinal stenosis, lumbar region without neurogenic claudication: Secondary | ICD-10-CM | POA: Diagnosis not present

## 2023-08-16 DIAGNOSIS — M960 Pseudarthrosis after fusion or arthrodesis: Secondary | ICD-10-CM | POA: Diagnosis not present

## 2023-08-16 DIAGNOSIS — M4726 Other spondylosis with radiculopathy, lumbar region: Secondary | ICD-10-CM | POA: Diagnosis not present

## 2023-08-17 DIAGNOSIS — M4726 Other spondylosis with radiculopathy, lumbar region: Secondary | ICD-10-CM | POA: Diagnosis not present

## 2023-08-17 DIAGNOSIS — T84216D Breakdown (mechanical) of internal fixation device of vertebrae, subsequent encounter: Secondary | ICD-10-CM | POA: Diagnosis not present

## 2023-08-17 DIAGNOSIS — E119 Type 2 diabetes mellitus without complications: Secondary | ICD-10-CM | POA: Diagnosis not present

## 2023-08-17 DIAGNOSIS — M960 Pseudarthrosis after fusion or arthrodesis: Secondary | ICD-10-CM | POA: Diagnosis not present

## 2023-08-17 DIAGNOSIS — M48061 Spinal stenosis, lumbar region without neurogenic claudication: Secondary | ICD-10-CM | POA: Diagnosis not present

## 2023-08-21 ENCOUNTER — Ambulatory Visit: Payer: Medicare HMO | Admitting: Oncology

## 2023-08-21 ENCOUNTER — Other Ambulatory Visit: Payer: Medicare HMO

## 2023-08-21 DIAGNOSIS — M960 Pseudarthrosis after fusion or arthrodesis: Secondary | ICD-10-CM | POA: Diagnosis not present

## 2023-08-21 DIAGNOSIS — E119 Type 2 diabetes mellitus without complications: Secondary | ICD-10-CM | POA: Diagnosis not present

## 2023-08-21 DIAGNOSIS — M4726 Other spondylosis with radiculopathy, lumbar region: Secondary | ICD-10-CM | POA: Diagnosis not present

## 2023-08-21 DIAGNOSIS — T84216D Breakdown (mechanical) of internal fixation device of vertebrae, subsequent encounter: Secondary | ICD-10-CM | POA: Diagnosis not present

## 2023-08-21 DIAGNOSIS — M48061 Spinal stenosis, lumbar region without neurogenic claudication: Secondary | ICD-10-CM | POA: Diagnosis not present

## 2023-08-22 ENCOUNTER — Encounter: Payer: 59 | Admitting: Physical Therapy

## 2023-08-22 DIAGNOSIS — M48061 Spinal stenosis, lumbar region without neurogenic claudication: Secondary | ICD-10-CM | POA: Diagnosis not present

## 2023-08-22 DIAGNOSIS — M960 Pseudarthrosis after fusion or arthrodesis: Secondary | ICD-10-CM | POA: Diagnosis not present

## 2023-08-22 DIAGNOSIS — T84216D Breakdown (mechanical) of internal fixation device of vertebrae, subsequent encounter: Secondary | ICD-10-CM | POA: Diagnosis not present

## 2023-08-22 DIAGNOSIS — E119 Type 2 diabetes mellitus without complications: Secondary | ICD-10-CM | POA: Diagnosis not present

## 2023-08-22 DIAGNOSIS — M4726 Other spondylosis with radiculopathy, lumbar region: Secondary | ICD-10-CM | POA: Diagnosis not present

## 2023-08-23 DIAGNOSIS — C673 Malignant neoplasm of anterior wall of bladder: Secondary | ICD-10-CM | POA: Diagnosis not present

## 2023-08-24 DIAGNOSIS — T84216D Breakdown (mechanical) of internal fixation device of vertebrae, subsequent encounter: Secondary | ICD-10-CM | POA: Diagnosis not present

## 2023-08-24 DIAGNOSIS — M4726 Other spondylosis with radiculopathy, lumbar region: Secondary | ICD-10-CM | POA: Diagnosis not present

## 2023-08-24 DIAGNOSIS — M960 Pseudarthrosis after fusion or arthrodesis: Secondary | ICD-10-CM | POA: Diagnosis not present

## 2023-08-24 DIAGNOSIS — E119 Type 2 diabetes mellitus without complications: Secondary | ICD-10-CM | POA: Diagnosis not present

## 2023-08-24 DIAGNOSIS — M48061 Spinal stenosis, lumbar region without neurogenic claudication: Secondary | ICD-10-CM | POA: Diagnosis not present

## 2023-08-28 DIAGNOSIS — M960 Pseudarthrosis after fusion or arthrodesis: Secondary | ICD-10-CM | POA: Diagnosis not present

## 2023-08-28 DIAGNOSIS — M48061 Spinal stenosis, lumbar region without neurogenic claudication: Secondary | ICD-10-CM | POA: Diagnosis not present

## 2023-08-28 DIAGNOSIS — T84216D Breakdown (mechanical) of internal fixation device of vertebrae, subsequent encounter: Secondary | ICD-10-CM | POA: Diagnosis not present

## 2023-08-28 DIAGNOSIS — M4726 Other spondylosis with radiculopathy, lumbar region: Secondary | ICD-10-CM | POA: Diagnosis not present

## 2023-08-28 DIAGNOSIS — E119 Type 2 diabetes mellitus without complications: Secondary | ICD-10-CM | POA: Diagnosis not present

## 2023-08-28 NOTE — Progress Notes (Signed)
 [facility]  [address] [place],  [place]  [address] [phone]  Clinic Day:  08/29/2023  Referring physician: Tamela Fake, MD   HISTORY OF PRESENT ILLNESS:  The patient is a 78 y.o. female with a history of recurrent iron deficiency anemia.  Furthermore, a component of her anemia has been related to chronic renal insufficiency.  She last received IV iron in June 2024.  She comes in today for routine follow-up.  Since her last visit, the patient has been doing fairly well.  She denies having increased fatigue or any overt forms of blood loss to explain her iron deficiency anemia.  With respect to her newly diagnosed bladder cancer, the patient just received her 6th and final BCG treatment earlier today.   VITALS:  Blood pressure (!) 137/98, pulse 68, temperature 97.8 F (36.6 C), temperature source Oral, resp. rate 16, height 5\' 4"  (1.626 m), weight 186 lb (84.4 kg), SpO2 97%.  Wt Readings from Last 3 Encounters:  08/29/23 186 lb (84.4 kg)  05/26/23 184 lb (83.5 kg)  05/09/23 185 lb (83.9 kg)    Body mass index is 31.93 kg/m.  Performance status (ECOG): 1  PHYSICAL EXAM:  Physical Exam Constitutional:      General: She is not in acute distress.    Appearance: Normal appearance. She is normal weight.  HENT:     Head: Normocephalic and atraumatic.  Eyes:     General: No scleral icterus.    Extraocular Movements: Extraocular movements intact.     Conjunctiva/sclera: Conjunctivae normal.     Pupils: Pupils are equal, round, and reactive to light.  Cardiovascular:     Rate and Rhythm: Normal rate and regular rhythm.     Pulses: Normal pulses.     Heart sounds: Normal heart sounds. No murmur heard.    No friction rub. No gallop.  Pulmonary:     Effort: Pulmonary effort is normal. No respiratory distress.     Breath sounds: Normal breath sounds.  Abdominal:     General: Bowel sounds are normal. There is no distension.     Palpations:  Abdomen is soft. There is no hepatomegaly, splenomegaly or mass.     Tenderness: There is no abdominal tenderness.  Musculoskeletal:        General: Normal range of motion.     Cervical back: Normal range of motion and neck supple.     Right lower leg: No edema.     Left lower leg: No edema.  Lymphadenopathy:     Cervical: No cervical adenopathy.  Skin:    General: Skin is warm and dry.  Neurological:     General: No focal deficit present.     Mental Status: She is alert and oriented to person, place, and time. Mental status is at baseline.  Psychiatric:        Mood and Affect: Mood normal.        Behavior: Behavior normal.        Thought Content: Thought content normal.        Judgment: Judgment normal.     LABS:    Latest Reference Range & Units 08/29/23 13:21  WBC 4.0 - 10.5 K/uL 8.8  RBC 3.87 - 5.11 MIL/uL 4.21  Hemoglobin 12.0 - 15.0 g/dL 91.4  HCT 78.2 - 95.6 % 39.4  MCV 80.0 - 100.0 fL 93.6  MCH 26.0 - 34.0 pg 28.7  MCHC 30.0 - 36.0 g/dL 21.3  RDW 08.6 - 57.8 %  15.1  Platelets 150 - 400 K/uL 366  nRBC 0.0 - 0.2 % 0 /100 WBC 0.00 0  Neutrophils % 72  Lymphocytes % 13  Monocytes Relative % 8  Eosinophil % 7  Basophil % 0  Immature Granulocytes % 0    Latest Reference Range & Units 08/29/23 13:21  Iron 28 - 170 ug/dL 64  UIBC ug/dL 409  TIBC 811 - 914 ug/dL 782  Saturation Ratios 10.4 - 31.8 % 16  Ferritin 11 - 307 ng/mL 20    Latest Reference Range & Units 08/29/23 13:21  Sodium 135 - 145 mmol/L 142  Potassium 3.5 - 5.1 mmol/L 3.3 (L)  Chloride 98 - 111 mmol/L 106  CO2 22 - 32 mmol/L 24  Glucose 70 - 99 mg/dL 956 (H)  BUN 8 - 23 mg/dL 14  Creatinine 2.13 - 0.86 mg/dL 5.78 (H)  Calcium  8.9 - 10.3 mg/dL 9.3  Anion gap 5 - 15  12  Alkaline Phosphatase 38 - 126 U/L 125  Albumin 3.5 - 5.0 g/dL 4.1  AST 15 - 41 U/L 23  ALT 0 - 44 U/L 22  Total Protein 6.5 - 8.1 g/dL 6.5  Total Bilirubin 0.0 - 1.2 mg/dL <4.6  GFR, Est Non African American >60 mL/min 55  (L)  (L): Data is abnormally low (H): Data is abnormally high  ASSESSMENT & PLAN:  Assessment/Plan:  A 78 year old woman with recurrent iron deficiency anemia.  Her labs today show that her iron and hemoglobin levels are slightly lower than previously, but remain at normal levels.  Clinically, she appears to be doing okay.  For now, her counts will continue to be followed conservatively.  I will see her back in 4 months for repeat clinical assessment.  The patient understands all the plans discussed today and is in agreement with them.   Telena Peyser Felicia Horde, MD

## 2023-08-29 ENCOUNTER — Telehealth: Payer: Self-pay | Admitting: Oncology

## 2023-08-29 ENCOUNTER — Inpatient Hospital Stay (HOSPITAL_BASED_OUTPATIENT_CLINIC_OR_DEPARTMENT_OTHER): Payer: Self-pay | Admitting: Oncology

## 2023-08-29 ENCOUNTER — Encounter: Payer: 59 | Admitting: Physical Therapy

## 2023-08-29 ENCOUNTER — Other Ambulatory Visit: Payer: Self-pay | Admitting: Oncology

## 2023-08-29 ENCOUNTER — Inpatient Hospital Stay: Payer: Self-pay | Attending: Oncology

## 2023-08-29 VITALS — BP 137/98 | HR 68 | Temp 97.8°F | Resp 16 | Ht 64.0 in | Wt 186.0 lb

## 2023-08-29 DIAGNOSIS — M4726 Other spondylosis with radiculopathy, lumbar region: Secondary | ICD-10-CM | POA: Diagnosis not present

## 2023-08-29 DIAGNOSIS — M960 Pseudarthrosis after fusion or arthrodesis: Secondary | ICD-10-CM | POA: Diagnosis not present

## 2023-08-29 DIAGNOSIS — D508 Other iron deficiency anemias: Secondary | ICD-10-CM

## 2023-08-29 DIAGNOSIS — D509 Iron deficiency anemia, unspecified: Secondary | ICD-10-CM | POA: Diagnosis not present

## 2023-08-29 DIAGNOSIS — M48061 Spinal stenosis, lumbar region without neurogenic claudication: Secondary | ICD-10-CM | POA: Diagnosis not present

## 2023-08-29 DIAGNOSIS — E119 Type 2 diabetes mellitus without complications: Secondary | ICD-10-CM | POA: Diagnosis not present

## 2023-08-29 DIAGNOSIS — T84216D Breakdown (mechanical) of internal fixation device of vertebrae, subsequent encounter: Secondary | ICD-10-CM | POA: Diagnosis not present

## 2023-08-29 DIAGNOSIS — C673 Malignant neoplasm of anterior wall of bladder: Secondary | ICD-10-CM | POA: Diagnosis not present

## 2023-08-29 LAB — CBC WITH DIFFERENTIAL (CANCER CENTER ONLY)
Abs Immature Granulocytes: 0.03 10*3/uL (ref 0.00–0.07)
Basophils Absolute: 0 10*3/uL (ref 0.0–0.1)
Basophils Relative: 0 %
Eosinophils Absolute: 0.6 10*3/uL — ABNORMAL HIGH (ref 0.0–0.5)
Eosinophils Relative: 7 %
HCT: 39.4 % (ref 36.0–46.0)
Hemoglobin: 12.1 g/dL (ref 12.0–15.0)
Immature Granulocytes: 0 %
Lymphocytes Relative: 13 %
Lymphs Abs: 1.1 10*3/uL (ref 0.7–4.0)
MCH: 28.7 pg (ref 26.0–34.0)
MCHC: 30.7 g/dL (ref 30.0–36.0)
MCV: 93.6 fL (ref 80.0–100.0)
Monocytes Absolute: 0.7 10*3/uL (ref 0.1–1.0)
Monocytes Relative: 8 %
Neutro Abs: 6.3 10*3/uL (ref 1.7–7.7)
Neutrophils Relative %: 72 %
Platelet Count: 366 10*3/uL (ref 150–400)
RBC: 4.21 MIL/uL (ref 3.87–5.11)
RDW: 15.1 % (ref 11.5–15.5)
WBC Count: 8.8 10*3/uL (ref 4.0–10.5)
nRBC: 0 % (ref 0.0–0.2)
nRBC: 0 /100{WBCs}

## 2023-08-29 LAB — CMP (CANCER CENTER ONLY)
ALT: 22 U/L (ref 0–44)
AST: 23 U/L (ref 15–41)
Albumin: 4.1 g/dL (ref 3.5–5.0)
Alkaline Phosphatase: 125 U/L (ref 38–126)
Anion gap: 12 (ref 5–15)
BUN: 14 mg/dL (ref 8–23)
CO2: 24 mmol/L (ref 22–32)
Calcium: 9.3 mg/dL (ref 8.9–10.3)
Chloride: 106 mmol/L (ref 98–111)
Creatinine: 1.03 mg/dL — ABNORMAL HIGH (ref 0.44–1.00)
GFR, Estimated: 55 mL/min — ABNORMAL LOW (ref >60)
Glucose, Bld: 138 mg/dL — ABNORMAL HIGH (ref 70–99)
Potassium: 3.3 mmol/L — ABNORMAL LOW (ref 3.5–5.1)
Sodium: 142 mmol/L (ref 135–145)
Total Bilirubin: <0.2 mg/dL (ref 0.0–1.2)
Total Protein: 6.5 g/dL (ref 6.5–8.1)

## 2023-08-29 LAB — IRON AND TIBC
Iron: 64 ug/dL (ref 28–170)
Saturation Ratios: 16 % (ref 10.4–31.8)
TIBC: 405 ug/dL (ref 250–450)
UIBC: 341 ug/dL

## 2023-08-29 LAB — FERRITIN: Ferritin: 20 ng/mL (ref 11–307)

## 2023-08-29 NOTE — Telephone Encounter (Signed)
 08/29/23 Spoke with patients daughter and confirmed next appts.

## 2023-08-30 ENCOUNTER — Telehealth: Payer: Self-pay

## 2023-08-30 DIAGNOSIS — E119 Type 2 diabetes mellitus without complications: Secondary | ICD-10-CM | POA: Diagnosis not present

## 2023-08-30 DIAGNOSIS — M960 Pseudarthrosis after fusion or arthrodesis: Secondary | ICD-10-CM | POA: Diagnosis not present

## 2023-08-30 DIAGNOSIS — M48061 Spinal stenosis, lumbar region without neurogenic claudication: Secondary | ICD-10-CM | POA: Diagnosis not present

## 2023-08-30 DIAGNOSIS — T84216D Breakdown (mechanical) of internal fixation device of vertebrae, subsequent encounter: Secondary | ICD-10-CM | POA: Diagnosis not present

## 2023-08-30 DIAGNOSIS — M4726 Other spondylosis with radiculopathy, lumbar region: Secondary | ICD-10-CM | POA: Diagnosis not present

## 2023-08-30 NOTE — Telephone Encounter (Signed)
 Latest Reference Range & Units 08/29/23 13:21  Iron 28 - 170 ug/dL 64  UIBC ug/dL 161  TIBC 096 - 045 ug/dL 409  Saturation Ratios 10.4 - 31.8 % 16  Ferritin 11 - 307 ng/mL 20

## 2023-08-31 ENCOUNTER — Other Ambulatory Visit: Payer: Self-pay | Admitting: Oncology

## 2023-08-31 ENCOUNTER — Encounter: Payer: Self-pay | Admitting: Oncology

## 2023-08-31 DIAGNOSIS — Z9889 Other specified postprocedural states: Secondary | ICD-10-CM | POA: Diagnosis not present

## 2023-08-31 DIAGNOSIS — I878 Other specified disorders of veins: Secondary | ICD-10-CM | POA: Diagnosis not present

## 2023-08-31 DIAGNOSIS — M4316 Spondylolisthesis, lumbar region: Secondary | ICD-10-CM | POA: Diagnosis not present

## 2023-08-31 DIAGNOSIS — D508 Other iron deficiency anemias: Secondary | ICD-10-CM

## 2023-08-31 DIAGNOSIS — Z981 Arthrodesis status: Secondary | ICD-10-CM | POA: Diagnosis not present

## 2023-08-31 DIAGNOSIS — I700 Atherosclerosis of aorta: Secondary | ICD-10-CM | POA: Diagnosis not present

## 2023-08-31 DIAGNOSIS — M8588 Other specified disorders of bone density and structure, other site: Secondary | ICD-10-CM | POA: Diagnosis not present

## 2023-08-31 DIAGNOSIS — M438X6 Other specified deforming dorsopathies, lumbar region: Secondary | ICD-10-CM | POA: Diagnosis not present

## 2023-08-31 NOTE — Telephone Encounter (Signed)
 Dr Harles Lied reviewed pt's lab results. He states, "they are normal, but trending down. I would like to see her in 4 months, rather then 6 months".

## 2023-09-02 DIAGNOSIS — G4733 Obstructive sleep apnea (adult) (pediatric): Secondary | ICD-10-CM | POA: Diagnosis not present

## 2023-09-05 ENCOUNTER — Encounter: Payer: 59 | Admitting: Physical Therapy

## 2023-09-07 DIAGNOSIS — J301 Allergic rhinitis due to pollen: Secondary | ICD-10-CM | POA: Diagnosis not present

## 2023-09-07 DIAGNOSIS — G4733 Obstructive sleep apnea (adult) (pediatric): Secondary | ICD-10-CM | POA: Diagnosis not present

## 2023-09-07 DIAGNOSIS — J4520 Mild intermittent asthma, uncomplicated: Secondary | ICD-10-CM | POA: Diagnosis not present

## 2023-09-07 DIAGNOSIS — R918 Other nonspecific abnormal finding of lung field: Secondary | ICD-10-CM | POA: Diagnosis not present

## 2023-09-07 DIAGNOSIS — M25561 Pain in right knee: Secondary | ICD-10-CM | POA: Diagnosis not present

## 2023-09-07 DIAGNOSIS — G8929 Other chronic pain: Secondary | ICD-10-CM | POA: Diagnosis not present

## 2023-09-08 ENCOUNTER — Telehealth: Payer: Self-pay | Admitting: Nurse Practitioner

## 2023-09-08 ENCOUNTER — Telehealth: Payer: Self-pay | Admitting: Gastroenterology

## 2023-09-08 MED ORDER — PANTOPRAZOLE SODIUM 40 MG PO TBEC: 40.0000 mg | DELAYED_RELEASE_TABLET | Freq: Every day | ORAL | 3 refills | Status: DC

## 2023-09-08 NOTE — Telephone Encounter (Signed)
 Patient called and stated that she was needing a refill on the Famotidine . Patient is wanting that medication sent over to [facility].  Contact number is [phone]. Please advise.

## 2023-09-08 NOTE — Telephone Encounter (Signed)
 Patient called and stated that she was needing a refill on her Pantoprazole . Patient is requesting that the medication be sent over to [facility]. A good contact number for them is [phone]. Please advise.

## 2023-09-08 NOTE — Telephone Encounter (Signed)
 Done

## 2023-09-09 NOTE — Telephone Encounter (Signed)
 DD, ok to refill Famotidine  40 mg bid # 180, 1 refill. THX.

## 2023-09-11 DIAGNOSIS — H9191 Unspecified hearing loss, right ear: Secondary | ICD-10-CM | POA: Diagnosis not present

## 2023-09-11 MED ORDER — FAMOTIDINE 40 MG PO TABS: 40.0000 mg | ORAL_TABLET | Freq: Two times a day (BID) | ORAL | 1 refills | Status: AC

## 2023-09-11 NOTE — Telephone Encounter (Signed)
 Rx sent.

## 2023-09-20 ENCOUNTER — Encounter: Payer: Self-pay | Admitting: Gastroenterology

## 2023-09-20 ENCOUNTER — Ambulatory Visit (INDEPENDENT_AMBULATORY_CARE_PROVIDER_SITE_OTHER): Admitting: Gastroenterology

## 2023-09-20 VITALS — BP 128/78 | HR 68 | Ht 64.0 in | Wt 182.0 lb

## 2023-09-20 DIAGNOSIS — K449 Diaphragmatic hernia without obstruction or gangrene: Secondary | ICD-10-CM | POA: Diagnosis not present

## 2023-09-20 DIAGNOSIS — K3184 Gastroparesis: Secondary | ICD-10-CM

## 2023-09-20 DIAGNOSIS — K5900 Constipation, unspecified: Secondary | ICD-10-CM | POA: Diagnosis not present

## 2023-09-20 DIAGNOSIS — C451 Mesothelioma of peritoneum: Secondary | ICD-10-CM | POA: Diagnosis not present

## 2023-09-20 DIAGNOSIS — K219 Gastro-esophageal reflux disease without esophagitis: Secondary | ICD-10-CM | POA: Diagnosis not present

## 2023-09-20 DIAGNOSIS — R1012 Left upper quadrant pain: Secondary | ICD-10-CM | POA: Diagnosis not present

## 2023-09-20 DIAGNOSIS — D509 Iron deficiency anemia, unspecified: Secondary | ICD-10-CM | POA: Diagnosis not present

## 2023-09-20 NOTE — Patient Instructions (Signed)
 _______________________________________________________  If your blood pressure at your visit was 140/90 or greater, please contact your primary care physician to follow up on this.  _______________________________________________________  If you are age 80 or older, your body mass index should be between 23-30. Your Body mass index is 31.24 kg/m. If this is out of the aforementioned range listed, please consider follow up with your Primary Care Provider.  If you are age 21 or younger, your body mass index should be between 19-25. Your Body mass index is 31.24 kg/m. If this is out of the aformentioned range listed, please consider follow up with your Primary Care Provider.   ________________________________________________________  The [facility] providers would like to encourage you to use MYCHART to communicate with providers for non-urgent requests or questions.  Due to long hold times on the telephone, sending your provider a message by [facility] may be a faster and more efficient way to get a response.  Please allow 48 business hours for a response.  Please remember that this is for non-urgent requests.  _______________________________________________________  Plan:   -Miralax 17g po two times daily until you produce a large bowel movement, then take every day. -Continue Protonix  40mg  every morning -Pepcid  20 mg at bedtime  Thank you for entrusting me with your care and choosing [facility].  Dr Venice Gillis

## 2023-09-20 NOTE — Progress Notes (Signed)
 IMPRESSION and PLAN:    #1.  Abdo pain LUQ/left flank pain-appears to be more musculoskeletal. Now with RUQ pain. CT also with significant constipation.  #2. 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 S/P multiple iron infusoins.  Followed by hematology. (Resolved off plavix , now on bASA) Recent GI workup included push SBE 10/06/2022 showing 3 cm HH, duodenal AVMs s/p APC. Nl Px jejunum VCE 10/06/2022 was neg for any small bowel active bleeding. Neg colon 11/2020 for etiology of IDA Neg PET 06/2022.  Majority of the hyperplastic gastric polyps were removed on EGD 03/2021 with Dr. Brice Campi.  Off plavix   #3. Gastroparesis. Reglan  stopped d/t tardive dyskinesia.  #4. GERD with large HH s/p repair Nov 2021, now with 3 cm residual/recurrent hernia on EGD.  #5. Peritoneal mesothelioma (low grade) at time of [facility] repair. Stable. Last CT chest/A/P March 2023 without progression. Being followed by Dr Arline Laity. Neg PET 06/2022    Plan:  -Miralax 17g po BID until large BM< then every day  -Continue Protonix  40mg  po QAM -Pepcid  20 mg p.o. at bedtime -Continue to follow-up with Dr. Harles Lied (hematology) -D/W pt and daughter. HPI:    Chief Complaint:   Deborah Hutchinson is a 78 y.o. female with CAD on plavix (off bASA), DM2, HH, OSA, COPD, LBP d/t OA, HTN, HLD.  Bladder CA Accompanied by her daughter  History of Present Illness C/O constipation and hemorrhoidal issues.  She experiences frequent urges to have a bowel movement but is unable to pass stool effectively, often resulting in the passage of small, incomplete stools.  Mostly since back surgery.  Has been using hemorrhoidal cream as well.  She has a history of mesothelioma with peritoneal thickening on the right side and reports pain across the top of her abdomen. She also notes the presence of a small external hemorrhoid, which she describes as a 'blue thing', but denies any associated pain.  Her past medical history  includes back surgery on August 02, 2023, which has impacted her mobility and may contribute to her bowel issues. She is currently taking baby aspirin . She also has a history of a hiatal hernia but denies any swallowing difficulties.  During the review of symptoms, she confirms the presence of a small external hemorrhoid but denies pain. Her hemoglobin levels were stable at twelve three weeks ago, and she is not experiencing any issues with swallowing related to her hiatal hernia.  Most recent hemoglobin was 12.    Discussed the use of AI scribe software for clinical note transcription with the patient, who gave verbal consent to proceed.  History of Present Illness   The patient, with a history of bladder cancer, presents with a new onset cough and associated musculoskeletal pain in the left upper quadrant. The cough started two days prior to the consultation and has been worsening. The patient also reports a sensation of swelling in the same area, which predates the cough. The patient completed a six-week course of BCG for her bladder cancer and is currently off Plavix . She is on a regimen of aspirin  and Protonix  twice daily.  The patient also reports issues with bowel incontinence, particularly at night, which is not associated with the cough. She has a history of constipation and hemorrhoids, which she tries to manage to avoid exacerbation of the hemorrhoids.  The patient has multiple spots on her lungs, which have been stable on recent imaging. She has been under the care of a pulmonologist for  this issue. The patient's last CT scan was in February, and she is due for a follow-up scan in March.  The patient also has a history of anemia, which has improved with her hemoglobin now at 13. She is not currently on any iron supplements. The patient also has a hiatal hernia, which is not currently causing any problems.       With recurrent IDA w/t H+ stools (on plavix ) s/p multiple IV iron  infusions.  Most recently her Hb has been stable in the range of 11-11.2  Recent GI workup included   Most recent CT chest/Abdo/pelvis 07/2022 was negative for any GI abnormalities.  It did show ?Urinary bladder neoplasm.  Patient is awaiting urology appointment since then.  Stable mesothelioma.  From GI standpoint she is doing well except for chronic Abdo bloating with discomfort.  She has postprandial diarrhea but still complains of "incomplete evacuation" she feels like she has a "stool ball" sitting in the rectum.  No obvious melena or hematochezia.      Previous notes: S/P HH repair by Dr. Yancey Helena November 2021. Was found to have low-grade mesothelioma.  She is closely being followed by Dr. Arline Laity.  No worsening. CT Abdo/pelvis March 2022, 07/2021 as below which did not show any progression.     Past GI procedures: CT AP with contrast 05/2023 1. Soft tissue anterior to the descending colon is in continuity with the musculature of the anterior abdominal wall, multiple areas similar to this appear bilaterally although this is the largest and most pronounced as well is more conspicuous than on prior examinations but nonspecific and of uncertain clinical significance particularly given patient's complaint of left upper quadrant pain. 2. Small hiatal hernia with symmetric distal esophageal wall thickening and wall thickening versus underdistention of the gastric antrum. Correlate for esophagitis and consider further evaluation with endoscopy. 3. Right lower lobe ground-glass opacities previously evaluated on PET-CT dated July 05, 2022 without significant uptake. Suggest continued CT chest surveillance. 4. Scattered colonic diverticulosis without findings of acute diverticulitis. 5. Fat containing right inguinal hernia. 6.  Aortic Atherosclerosis (ICD10-I70.0).  SBE 10/06/2022 showing 3 cm HH, duodenal AVMs s/p APC.  VCE 10/06/2022 was negative for any small bowel active  bleeding.     CT C/A/P wih contrast 07/26/2022 1.  There is a centimeter enhancing focus at the right bladder which raises concern for a primary urothelial neoplasm. Consider cystoscopy for further evaluation- AWAITING urology consulatation  2.  Distal sigmoid diverticula with adjacent stranding concerning for diverticulitis. No evidence of perforation or adjacent fluid collection.  3.  No convincing evidence of progressive peritoneal or diaphragmatic disease.  4.  Similar scattered groundglass opacities/nodules within the lungs and bilateral adrenal nodules.   PET 06/2022 IMPRESSION:  1. No tracer avid solid nodule or mass identified.  2. As noted previously there are multiple ground-glass nodules  identified within both lungs. These do not demonstrate any  appreciable tracer uptake. The largest ground-glass nodule is in the  periphery of the left upper lobe measuring 1.3 cm. Absent FDG uptake  does not exclude the possibility of underlying indolent neoplastic  process such as pulmonary adenocarcinoma and continued interval  surveillance of these nodules is strongly recommended.  3. Stable left adrenal adenoma.  4. Coronary artery calcifications.  5.  Aortic Atherosclerosis (ICD10-I70.0).   CT chest/AP 07/2021 1.  No significant change in appearance of thickening of the diaphragm status post paraesophageal hernia repair. No definite new peritoneal disease or diaphragmatic nodularity identified.  2.  Similar groundglass opacities/nodules throughout the lungs bilaterally.  3.  Similar bilateral adrenal nodules. Can consider MR imaging for further characterization as clinically indicated.  EGD 03/2021 (Dr Brice Campi) - No gross lesions in esophagus proximally. LA Grade A esophagitis with no bleeding distally (this is improved from prior). - Z-line irregular, 34 cm from the incisors. - 3 cm hiatal hernia. - Four gastric polyps. Resected and retrieved. Clips (MR conditional) were  placed. - Gastritis. Biopsied. - No gross lesions in the duodenal bulb, in the first portion of the duodenum and in the second portion of the duodenum.   EGD 11/27/2020 - LA Grade C reflux esophagitis with no bleeding. Bx- neg - 3 cm hiatal hernia. S/P fundoplication. - 3 10-12 mm semi-sessile polyps with no bleeding and no stigmata of recent bleeding were found in the gastric body (1 at the diaphragmatic hiatus and the other 2 in proximal body of the stomach). Bx- Hyperplastic - Normal examined duodenum.  Colon 11/27/2020 - Seven 6 to 8 mm polyps in the mid descending colon, in the mid transverse colon, in the proximal ascending colon and in the mid ascending colon, removed with a cold snare. Resected and retrieved. Bx- TAs - Diverticulosis in the sigmoid colon and in the ascending colon.  EGD 08/2019: Candida esophagitis, no stricture, 7 cm hiatal hernia.  Treated empirically for Candida esophagitis  EGD 12/14/2018: Large HH, hyperplastic gastric polyps, eso stricture s/p dil 50 Fr. EGD 08/17/2015 mod HH, neg SB Bx for celiac.  UGI series 04/2019: HH, GERD, no strictures.  Ba tab passed without any problems.  Colonoscopy 12/14/2018-colonic polyps s/p polypectomy, mild pancolonic div. Bx- TA.  CT AP with contrast 02/09/2018: Neg, Stable left adrenal adenoma, small right inguinal hernia.  GES 08/2019: delayed gastric emptying (12% emptied at hr 1, 29% at hr 2, 47% at hr 3, 64% an hr 4).  CT AP 07/2020 Impression: 1. Mildly thickened appearance of the hemidiaphragms without a discrete measurable mass, overall similar relative to the prior CT dated 01/08/2020 in this patient with biopsy-proven peritoneal mesothelioma involving the right hemidiaphragm. No definite new peritoneal nodules are identified. 2. No ascites. 3. Similar groundglass opacities/nodules throughout the lungs bilaterally, most conspicuous in the right lower lobe. These findings may be infectious, inflammatory, or neoplastic in  etiology. 4. 4 mm solid right lower lobe pulmonary nodule, unchanged. The previously identified solid nodule in the right upper lobe is no longer seen. 5. Indeterminate bilateral adrenal lesions, measuring up to 1.7 cm on the left. Consider recharacterization with MRI is recommended. 6. Multiple additional ancillary findings, as above.   ============================  PATHOLOGY: PATHOLOGY: Final Pathologic Diagnosis  A. "Frozen peritoneal nodule":        Mesothelioma, epithelioid type, low tumor grade, trabecular pattern.       See comment.   B. "Peritoneal nodule":       Mesothelioma, epithelioid type, low tumor grade, trabecular pattern.       See comment.     LHC 05/08/2019: Prox RCA lesion is 50% stenosed. Prox LAD lesion is 40% stenosed. The left ventricular systolic function is normal. LV end diastolic pressure is normal. The left ventricular ejection fraction is greater than 65% by visual estimate. There is no mitral valve regurgitation. Past Medical History:  Diagnosis Date   Angina pectoris (HCC) 06/17/2020   Anxiety    Arthritis    all over   Asthma    Follows w/ Dr. Vela Gerhard, pulmonologist.   Bronchitis  CAD (coronary artery disease) 02/13/2019   Cancer (HCC)    mesothelioma of diaphragm, spot on lung also, probable bladder cancer   Chest pain 05/08/2019   Chest pain of uncertain etiology    Chronic bilateral low back pain without sciatica 07/11/2019   Pain goes down into thighs.   Chronic kidney disease    Chronic obstructive pulmonary disease (HCC) 02/09/2017   Formatting of this note might be different from the original. Last Assessment & Plan:  I will review her PFTs after I obtain records from her pulmonologist. Based on this we will decide if she needs inhalers, subjectively they do not seem to have helped her at all. She may benefit from a pulmonary rehabilitation program Formatting of this note might be different from the original. Last Assessment     COPD (chronic obstructive pulmonary disease) (HCC) 02/09/2017   Coronary artery disease involving native coronary artery of native heart without angina pectoris 06/10/2015   Depression    Diabetes mellitus due to underlying condition with unspecified complications (HCC) 06/10/2015   Dyslipidemia 06/10/2015   Essential hypertension 06/10/2015   Gastroparesis 09/04/2019   GERD (gastroesophageal reflux disease)    H/O hiatal hernia    Headache 09/24/2018   Heart murmur    Hyperlipemia    Hypertension    Iron deficiency anemia 09/25/2020   Lumbar radicular pain 08/08/2022   Lumbar radiculopathy 12/18/2020   Mixed dyslipidemia 06/10/2015   Moderate aortic stenosis 05/16/2019   Nausea and vomiting 06/16/2020   occasional   Neurogenic claudication 12/18/2020   OSA (obstructive sleep apnea) 02/09/2017   Pain and swelling of toe of right foot 07/15/2016   Palpitations 06/02/2016   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   S/P lumbar fusion 03/26/2020   Shortness of breath    on  excertion   Sleep apnea     Current Outpatient Medications  Medication Sig Dispense Refill   albuterol  (PROVENTIL  HFA;VENTOLIN  HFA) 108 (90 Base) MCG/ACT inhaler Inhale 2 puffs into the lungs every 6 (six) hours as needed for wheezing or shortness of breath. 3 Inhaler 3   amLODipine  (NORVASC ) 10 MG tablet Take 10 mg by mouth daily.     budesonide -formoterol  (SYMBICORT ) 160-4.5 MCG/ACT inhaler Inhale 2 puffs into the lungs 2 (two) times daily. 3 Inhaler 3   buPROPion  (WELLBUTRIN  XL) 300 MG 24 hr tablet Take 300 mg by mouth daily.      Cholecalciferol (VITAMIN D  PO) Take 5,000 Units by mouth as directed. Every 2 weeks     clopidogrel  (PLAVIX ) 75 MG tablet Take 1 tablet (75 mg total) by mouth daily. 30 tablet 11   dicyclomine (BENTYL) 20 MG tablet Take 20 mg by mouth every 6 (six) hours as needed.            escitalopram  (LEXAPRO ) 10 MG tablet Take 10 mg by mouth daily.     famotidine  (PEPCID ) 40 MG tablet Take  1 tablet (40 mg total) by mouth at bedtime. 180 tablet 3   fluticasone  (VERAMYST) 27.5 MCG/SPRAY nasal spray Place 2 sprays into the nose daily.     gabapentin (NEURONTIN) 300 MG capsule Take 300 mg by mouth 3 (three) times daily as needed.      hydrALAZINE  (APRESOLINE ) 50 MG tablet Take 1 tablet (50 mg total) by mouth 3 (three) times daily. 180 tablet 2   hydrochlorothiazide  (HYDRODIURIL ) 25 MG tablet Take 1 tablet (25 mg total) by mouth daily. 60 tablet 2   Lancets (ACCU-CHEK MULTICLIX) lancets  linaclotide  (LINZESS ) 72 MCG capsule Take 72 mcg by mouth as needed.     losartan  (COZAAR ) 100 MG tablet Take 1 tablet (100 mg total) by mouth daily. 60 tablet 2   metFORMIN (GLUCOPHAGE) 500 MG tablet Take 500 mg by mouth 2 (two) times daily with a meal.      montelukast  (SINGULAIR ) 10 MG tablet Take 10 mg by mouth at bedtime.      Multiple Vitamin (MULTIVITAMIN) capsule Take 1 capsule by mouth daily.       oxybutynin  (DITROPAN -XL) 10 MG 24 hr tablet Take 10 mg by mouth daily.     pantoprazole  (PROTONIX ) 40 MG tablet Take 1 tablet (40 mg total) by mouth daily. 60 tablet 2   Potassium Chloride  CR (MICRO-K ) 8 MEQ CPCR capsule CR TAKE 1 CAPSULE BY MOUTH ONCE DAILY FOR LOW POTASSIUM     simvastatin  (ZOCOR ) 20 MG tablet Take 20 mg by mouth at bedtime.     spironolactone  (ALDACTONE ) 25 MG tablet Take 1 tablet (25 mg total) by mouth daily. 60 tablet 2   traMADol (ULTRAM) 50 MG tablet Take 50 mg by mouth 2 (two) times daily as needed for pain.     VICTOZA 18 MG/3ML SOPN 12 mg daily.      BD PEN NEEDLE NANO U/F 32G X 4 MM MISC      isosorbide  mononitrate (IMDUR ) 120 MG 24 hr tablet Take 1 tablet (120 mg total) by mouth daily. (Patient not taking: Reported on 06/10/2019) 60 tablet 2   nitroGLYCERIN  (NITROSTAT ) 0.4 MG SL tablet DISSOLVE ONE TABLET UNDER THE TONGUE EVERY 5 MINUTES AS NEEDED FOR CHEST PAIN.  DO NOT EXCEED A TOTAL OF 3 DOSES IN 15 MINUTES (Patient not taking: Reported on 06/10/2019) 25 tablet 5    No current facility-administered medications for this visit.    Past Surgical History:  Procedure Laterality Date   ABDOMINAL HYSTERECTOMY     BIOPSY  03/22/2021   Procedure: BIOPSY;  Surgeon: Brice Campi Albino Alu., MD;  Location: [facility];  Service: Gastroenterology;;   BREAST SURGERY     begin mass,left  breast   COLONOSCOPY  08/17/2015   Colonic polyp status post polypectomy. Mild pancolonic diverticulosis. Highly redundant colon.    DILATION AND CURETTAGE OF UTERUS     one   ENDOSCOPIC MUCOSAL RESECTION N/A 03/22/2021   Procedure: ENDOSCOPIC MUCOSAL RESECTION;  Surgeon: Brice Campi Albino Alu., MD;  Location: [facility];  Service: Gastroenterology;  Laterality: N/A;   ENTEROSCOPY N/A 10/06/2022   Procedure: ENTEROSCOPY;  Surgeon: Lajuan Pila, MD;  Location: [facility];  Service: Gastroenterology;  Laterality: N/A;   ESOPHAGOGASTRODUODENOSCOPY  08/17/2015   Moderate hiatal hernia. Otherwise noraml EGD.    ESOPHAGOGASTRODUODENOSCOPY  09/03/2019   [facility]   ESOPHAGOGASTRODUODENOSCOPY (EGD) WITH PROPOFOL  N/A 03/22/2021   Procedure: ESOPHAGOGASTRODUODENOSCOPY (EGD) WITH PROPOFOL ;  Surgeon: Normie Becton., MD;  Location: [facility];  Service: Gastroenterology;  Laterality: N/A;   EUS  05/19/2011   Procedure: UPPER ENDOSCOPIC ULTRASOUND (EUS) LINEAR;  Surgeon: Hoyt Macleod, MD;  Location: [facility];  Service: Endoscopy;  Laterality: N/A;  radial linear    GIVENS CAPSULE STUDY N/A 10/06/2022   Procedure: GIVENS CAPSULE STUDY;  Surgeon: Lajuan Pila, MD;  Location: [facility];  Service: Gastroenterology;  Laterality: N/A;   HEMOSTASIS CLIP PLACEMENT  03/22/2021   Procedure: HEMOSTASIS CLIP PLACEMENT;  Surgeon: Normie Becton., MD;  Location: [facility];  Service: Gastroenterology;;   HERNIA REPAIR  05/09/2020   HOT HEMOSTASIS N/A 10/06/2022  Procedure: HOT HEMOSTASIS (ARGON PLASMA COAGULATION/BICAP);  Surgeon: Lajuan Pila,  MD;  Location: [facility];  Service: Gastroenterology;  Laterality: N/A;   HYSTEROTOMY     KNEE SURGERY     LAPAROSCOPIC PARAESOPHAGEAL HERNIA REPAIR  12/07/2019   LEFT HEART CATH AND CORONARY ANGIOGRAPHY     LEFT HEART CATH AND CORONARY ANGIOGRAPHY N/A 05/08/2019   Procedure: LEFT HEART CATH AND CORONARY ANGIOGRAPHY;  Surgeon: Odie Benne, MD;  Location: [facility];  Service: Cardiovascular;  Laterality: N/A;   right thumb     tendon repair   RIGHT/LEFT HEART CATH AND CORONARY ANGIOGRAPHY N/A 06/23/2020   Procedure: RIGHT/LEFT HEART CATH AND CORONARY ANGIOGRAPHY;  Surgeon: Swaz[place], Peter M, MD;  [facility] CV [facility];  Service: Cardiovascular;  Laterality: N/A;   SHOULDER SURGERY     rotator cuff repair   SUBMUCOSAL LIFTING INJECTION  03/22/2021   Procedure: SUBMUCOSAL LIFTING INJECTION;  Surgeon: Normie Becton., MD;  Location: [facility];  Service: Gastroenterology;;   TRANSFORAMINAL LUMBAR INTERBODY FUSION L4-5   02/27/2020   WRIST SURGERY Right     Family History  Problem Relation Age of Onset   Diabetes Sister    Cancer Sister    Hypertension Daughter    Colon cancer Neg Hx    Liver disease Neg Hx    Pancreatic cancer Neg Hx    Esophageal cancer Neg Hx     Social History   Tobacco Use   Smoking status: Former    Current packs/day: 0.00    Types: Cigarettes    Quit date: 05/17/2011    Years since quitting: 12.3   Smokeless tobacco: Never  Vaping Use   Vaping status: Never Used  Substance Use Topics   Alcohol use: No   Drug use: No    Allergies  Allergen Reactions   Penicillins Rash    Reaction was 30years ago  Has never taken again   Ampicillin Hives   Gabapentin Other (See Comments)     Hallucinations      Review of Systems: All systems reviewed and negative except where noted in HPI.    Physical Exam:     BP 128/78   Pulse 68   Ht 5\' 4"  (1.626 m)   Wt 182 lb (82.6 kg)   SpO2 98%   BMI 31.24 kg/m   Gen:  awake, alert, NAD HEENT: anicteric, no pallor Abd: soft, NT/ND, +BS throughout Rectal exam-in presence of Nicole.  Hard stools.  No impaction.  Heme-negative.  Small external hemorrhoids Ext: no c/c/e Neuro: nonfocal     Latest Ref Rng & Units 08/29/2023    1:21 PM 05/09/2023    9:01 AM 02/20/2023   12:00 AM  CBC  WBC 4.0 - 10.5 K/uL 8.8  7.1  5.6      Hemoglobin 12.0 - 15.0 g/dL 16.1  09.6  04.5      Hematocrit 36.0 - 46.0 % 39.4  38.5  40      Platelets 150 - 400 K/uL 366  258.0  471         This result is from an external source.      Latest Ref Rng & Units 08/29/2023    1:21 PM 05/09/2023    9:01 AM 02/20/2023    4:04 PM  CMP  Glucose 70 - 99 mg/dL 409  811  80   BUN 8 - 23 mg/dL 14  13  13    Creatinine 0.44 - 1.00 mg/dL 9.14  1.10  1.12   Sodium 135 - 145 mmol/L 142  144  141   Potassium 3.5 - 5.1 mmol/L 3.3  3.9  4.0   Chloride 98 - 111 mmol/L 106  108  105   CO2 22 - 32 mmol/L 24  27  24    Calcium  8.9 - 10.3 mg/dL 9.3  9.1  9.9   Total Protein 6.5 - 8.1 g/dL 6.5  6.3  7.0   Total Bilirubin 0.0 - 1.2 mg/dL <0.8  0.4  0.5   Alkaline Phos 38 - 126 U/L 125  110  92   AST 15 - 41 U/L 23  38  41   ALT 0 - 44 U/L 22  54  50        Alishia Lebo,MD 09/20/2023, 11:12 AM

## 2023-09-21 DIAGNOSIS — M4316 Spondylolisthesis, lumbar region: Secondary | ICD-10-CM | POA: Diagnosis not present

## 2023-09-21 DIAGNOSIS — M1611 Unilateral primary osteoarthritis, right hip: Secondary | ICD-10-CM | POA: Diagnosis not present

## 2023-09-26 ENCOUNTER — Other Ambulatory Visit: Payer: Self-pay | Admitting: Physician Assistant

## 2023-09-26 DIAGNOSIS — G8929 Other chronic pain: Secondary | ICD-10-CM | POA: Diagnosis not present

## 2023-09-26 DIAGNOSIS — M5416 Radiculopathy, lumbar region: Secondary | ICD-10-CM | POA: Diagnosis not present

## 2023-09-26 DIAGNOSIS — M549 Dorsalgia, unspecified: Secondary | ICD-10-CM | POA: Diagnosis not present

## 2023-09-26 DIAGNOSIS — M961 Postlaminectomy syndrome, not elsewhere classified: Secondary | ICD-10-CM | POA: Diagnosis not present

## 2023-09-26 DIAGNOSIS — M4316 Spondylolisthesis, lumbar region: Secondary | ICD-10-CM

## 2023-09-29 ENCOUNTER — Encounter: Payer: Self-pay | Admitting: Physician Assistant

## 2023-10-03 ENCOUNTER — Ambulatory Visit
Admission: RE | Admit: 2023-10-03 | Discharge: 2023-10-03 | Disposition: A | Discharge status: Home / Self Care | Source: Ambulatory Visit | Attending: Physician Assistant | Admitting: Physician Assistant

## 2023-10-03 DIAGNOSIS — M4316 Spondylolisthesis, lumbar region: Secondary | ICD-10-CM

## 2023-10-03 DIAGNOSIS — M25551 Pain in right hip: Secondary | ICD-10-CM | POA: Diagnosis not present

## 2023-10-03 MED ORDER — METHYLPREDNISOLONE ACETATE 40 MG/ML INJ SUSP (RADIOLOG
80.0000 mg | Freq: Once | INTRAMUSCULAR | Status: AC
  Administered 2023-10-03: 80 mg via INTRA_ARTICULAR

## 2023-10-03 MED ORDER — METHYLPREDNISOLONE ACETATE 40 MG/ML INJ SUSP (RADIOLOG: 80.0000 mg | Freq: Once | INTRAMUSCULAR | Status: DC

## 2023-10-03 MED ORDER — IOPAMIDOL (ISOVUE-M 200) INJECTION 41%
1.0000 mL | Freq: Once | INTRAMUSCULAR | Status: AC
  Administered 2023-10-03: 1 mL via EPIDURAL

## 2023-10-04 DIAGNOSIS — E119 Type 2 diabetes mellitus without complications: Secondary | ICD-10-CM | POA: Diagnosis not present

## 2023-10-05 ENCOUNTER — Ambulatory Visit: Admitting: Physical Therapy

## 2023-10-09 ENCOUNTER — Other Ambulatory Visit: Payer: Self-pay | Admitting: Nurse Practitioner

## 2023-10-09 ENCOUNTER — Telehealth: Payer: Self-pay | Admitting: Gastroenterology

## 2023-10-09 DIAGNOSIS — G4733 Obstructive sleep apnea (adult) (pediatric): Secondary | ICD-10-CM | POA: Diagnosis not present

## 2023-10-09 NOTE — Telephone Encounter (Signed)
 Patient called and stated that she has very itchy and pain hemorrhoids and was wanting to know if Dr. Venice Gillis can call her in some medication for her hemorrhoids. Patient is requesting a call back. Please advise.

## 2023-10-09 NOTE — Telephone Encounter (Signed)
 Pt stated that she has been have recent itching, burning and pain from her hemorrhoids when sitting down.  Pt was provided  with recommendations to relieve symptoms; OTC Preparation H hemorrhoid cream, Sitz bath, and wet wipes. . Pt stated that he PCP had called in a prescription for suppositories.  Pt verbalized understanding with all questions answered.

## 2023-10-11 DIAGNOSIS — G894 Chronic pain syndrome: Secondary | ICD-10-CM | POA: Diagnosis not present

## 2023-10-11 DIAGNOSIS — R053 Chronic cough: Secondary | ICD-10-CM | POA: Diagnosis not present

## 2023-10-11 DIAGNOSIS — I10 Essential (primary) hypertension: Secondary | ICD-10-CM | POA: Diagnosis not present

## 2023-10-11 DIAGNOSIS — K649 Unspecified hemorrhoids: Secondary | ICD-10-CM | POA: Diagnosis not present

## 2023-10-11 DIAGNOSIS — K219 Gastro-esophageal reflux disease without esophagitis: Secondary | ICD-10-CM | POA: Diagnosis not present

## 2023-10-11 DIAGNOSIS — E559 Vitamin D deficiency, unspecified: Secondary | ICD-10-CM | POA: Diagnosis not present

## 2023-10-12 DIAGNOSIS — Z79899 Other long term (current) drug therapy: Secondary | ICD-10-CM | POA: Diagnosis not present

## 2023-10-12 DIAGNOSIS — E559 Vitamin D deficiency, unspecified: Secondary | ICD-10-CM | POA: Diagnosis not present

## 2023-10-18 ENCOUNTER — Telehealth: Payer: Self-pay | Admitting: Cardiology

## 2023-10-18 NOTE — Telephone Encounter (Signed)
 Pt c/o of Chest Pain: STAT if active (IN THIS MOMENT) CP, including tightness, pressure, jaw pain, shoulder/upper arm/back pain, SOB, nausea, and vomiting.  1. Are you having CP right now (tightness, pressure, or discomfort)? no  2. Are you experiencing any other symptoms (ex. SOB, nausea, vomiting, sweating)? Headaches, tiredness  3. How long have you been experiencing CP? Couples days  4. Is your CP continuous or coming and going? Coming and going   5. Have you taken Nitroglycerin ? yes  6. If CP returns before callback, please consider calling 911. ?   Daughter called in but ask that we call the patient back on her home phone.

## 2023-10-18 NOTE — Telephone Encounter (Signed)
 Spoke with pt who states that she has the pain upon waking and then it resolves. Pt states that she does not have the pain now. Denies N/V, shortness or breath or diaphoresis. Advised of MI sx and when to use NTG and call 911 and go to the ED. Pt verbalized understanding and had no additional questions.

## 2023-10-19 DIAGNOSIS — G4733 Obstructive sleep apnea (adult) (pediatric): Secondary | ICD-10-CM | POA: Diagnosis not present

## 2023-10-19 DIAGNOSIS — J4520 Mild intermittent asthma, uncomplicated: Secondary | ICD-10-CM | POA: Diagnosis not present

## 2023-10-19 DIAGNOSIS — J301 Allergic rhinitis due to pollen: Secondary | ICD-10-CM | POA: Diagnosis not present

## 2023-10-19 DIAGNOSIS — R918 Other nonspecific abnormal finding of lung field: Secondary | ICD-10-CM | POA: Diagnosis not present

## 2023-10-24 ENCOUNTER — Other Ambulatory Visit: Payer: Self-pay

## 2023-10-24 DIAGNOSIS — C801 Malignant (primary) neoplasm, unspecified: Secondary | ICD-10-CM | POA: Insufficient documentation

## 2023-10-24 DIAGNOSIS — F32A Depression, unspecified: Secondary | ICD-10-CM | POA: Insufficient documentation

## 2023-10-24 DIAGNOSIS — N189 Chronic kidney disease, unspecified: Secondary | ICD-10-CM | POA: Insufficient documentation

## 2023-10-25 ENCOUNTER — Ambulatory Visit: Admitting: Cardiology

## 2023-10-26 DIAGNOSIS — M5440 Lumbago with sciatica, unspecified side: Secondary | ICD-10-CM | POA: Diagnosis not present

## 2023-10-26 DIAGNOSIS — Z981 Arthrodesis status: Secondary | ICD-10-CM | POA: Diagnosis not present

## 2023-10-26 DIAGNOSIS — M5416 Radiculopathy, lumbar region: Secondary | ICD-10-CM | POA: Diagnosis not present

## 2023-10-26 DIAGNOSIS — M5136 Other intervertebral disc degeneration, lumbar region with discogenic back pain only: Secondary | ICD-10-CM | POA: Diagnosis not present

## 2023-10-26 DIAGNOSIS — G4733 Obstructive sleep apnea (adult) (pediatric): Secondary | ICD-10-CM | POA: Diagnosis not present

## 2023-10-30 DIAGNOSIS — G4733 Obstructive sleep apnea (adult) (pediatric): Secondary | ICD-10-CM | POA: Diagnosis not present

## 2023-10-30 DIAGNOSIS — J301 Allergic rhinitis due to pollen: Secondary | ICD-10-CM | POA: Diagnosis not present

## 2023-10-30 DIAGNOSIS — R918 Other nonspecific abnormal finding of lung field: Secondary | ICD-10-CM | POA: Diagnosis not present

## 2023-10-30 DIAGNOSIS — J4520 Mild intermittent asthma, uncomplicated: Secondary | ICD-10-CM | POA: Diagnosis not present

## 2023-11-01 DIAGNOSIS — M4726 Other spondylosis with radiculopathy, lumbar region: Secondary | ICD-10-CM | POA: Diagnosis not present

## 2023-11-01 DIAGNOSIS — M4316 Spondylolisthesis, lumbar region: Secondary | ICD-10-CM | POA: Diagnosis not present

## 2023-11-01 DIAGNOSIS — M5116 Intervertebral disc disorders with radiculopathy, lumbar region: Secondary | ICD-10-CM | POA: Diagnosis not present

## 2023-11-01 DIAGNOSIS — M48061 Spinal stenosis, lumbar region without neurogenic claudication: Secondary | ICD-10-CM | POA: Diagnosis not present

## 2023-11-02 DIAGNOSIS — M1611 Unilateral primary osteoarthritis, right hip: Secondary | ICD-10-CM | POA: Diagnosis not present

## 2023-11-02 DIAGNOSIS — M4316 Spondylolisthesis, lumbar region: Secondary | ICD-10-CM | POA: Diagnosis not present

## 2023-11-03 DIAGNOSIS — Z8551 Personal history of malignant neoplasm of bladder: Secondary | ICD-10-CM | POA: Diagnosis not present

## 2023-11-15 DIAGNOSIS — K649 Unspecified hemorrhoids: Secondary | ICD-10-CM | POA: Diagnosis not present

## 2023-11-15 DIAGNOSIS — Z8551 Personal history of malignant neoplasm of bladder: Secondary | ICD-10-CM | POA: Diagnosis not present

## 2023-11-15 DIAGNOSIS — I10 Essential (primary) hypertension: Secondary | ICD-10-CM | POA: Diagnosis not present

## 2023-11-15 DIAGNOSIS — E559 Vitamin D deficiency, unspecified: Secondary | ICD-10-CM | POA: Diagnosis not present

## 2023-11-15 DIAGNOSIS — K219 Gastro-esophageal reflux disease without esophagitis: Secondary | ICD-10-CM | POA: Diagnosis not present

## 2023-11-15 DIAGNOSIS — E785 Hyperlipidemia, unspecified: Secondary | ICD-10-CM | POA: Diagnosis not present

## 2023-11-15 DIAGNOSIS — E1140 Type 2 diabetes mellitus with diabetic neuropathy, unspecified: Secondary | ICD-10-CM | POA: Diagnosis not present

## 2023-11-15 DIAGNOSIS — R413 Other amnesia: Secondary | ICD-10-CM | POA: Diagnosis not present

## 2023-11-15 DIAGNOSIS — I2510 Atherosclerotic heart disease of native coronary artery without angina pectoris: Secondary | ICD-10-CM | POA: Diagnosis not present

## 2023-11-15 DIAGNOSIS — Z8669 Personal history of other diseases of the nervous system and sense organs: Secondary | ICD-10-CM | POA: Diagnosis not present

## 2023-11-17 DIAGNOSIS — R2681 Unsteadiness on feet: Secondary | ICD-10-CM | POA: Diagnosis not present

## 2023-11-17 DIAGNOSIS — M159 Polyosteoarthritis, unspecified: Secondary | ICD-10-CM | POA: Diagnosis not present

## 2023-11-20 DIAGNOSIS — J301 Allergic rhinitis due to pollen: Secondary | ICD-10-CM | POA: Diagnosis not present

## 2023-11-20 DIAGNOSIS — G4733 Obstructive sleep apnea (adult) (pediatric): Secondary | ICD-10-CM | POA: Diagnosis not present

## 2023-11-20 DIAGNOSIS — R918 Other nonspecific abnormal finding of lung field: Secondary | ICD-10-CM | POA: Diagnosis not present

## 2023-11-20 DIAGNOSIS — J4520 Mild intermittent asthma, uncomplicated: Secondary | ICD-10-CM | POA: Diagnosis not present

## 2023-11-22 DIAGNOSIS — M47816 Spondylosis without myelopathy or radiculopathy, lumbar region: Secondary | ICD-10-CM | POA: Diagnosis not present

## 2023-11-23 ENCOUNTER — Ambulatory Visit: Admitting: Gastroenterology

## 2023-11-26 NOTE — Therapy (Signed)
 OUTPATIENT PHYSICAL THERAPY FEMALE PELVIC EVALUATION   Patient Name: Deborah Hutchinson MRN: [id number] DOB:[birth year], 78 y.o., female Today's Date: 11/27/2023  END OF SESSION:  PT End of Session - 11/27/23 1641     Visit Number 1    Authorization Type waiting on auth    PT Start Time 1120    PT Stop Time 1145    PT Time Calculation (min) 25 min    Activity Tolerance Patient tolerated treatment well;Patient limited by pain    Behavior During Therapy [facility] for tasks assessed/performed          Past Medical History:  Diagnosis Date   Aftercare following surgery 03/26/2020   Allergy 03/29/2021   Angina pectoris (HCC) 06/17/2020   Anxiety    Arthritis    all over   Asthma    Follows w/ Dr. Marina, pulmonologist.   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   Chronic kidney disease    Chronic obstructive pulmonary disease (HCC) 02/09/2017   Formatting of this note might be different from the original. Last Assessment & Plan:  I will review her PFTs after I obtain records from her pulmonologist. Based on this we will decide if she needs inhalers, subjectively they do not seem to have helped her at all. She may benefit from a pulmonary rehabilitation program Formatting of this note might be different from the original. Last Assessment    COPD (chronic obstructive pulmonary disease) (HCC) 02/09/2017   Coronary artery disease involving native coronary artery of native heart without angina pectoris 06/10/2015   Depression    Diabetes mellitus due to underlying condition with unspecified complications (HCC) 06/10/2015   Dyslipidemia 06/10/2015   Esophageal dysphagia 09/02/2019   Formatting of this note might be different from the  original.  Added automatically from request for surgery [id number]     Essential hypertension 06/10/2015   Gastroparesis 09/04/2019   GERD (gastroesophageal reflux disease)    H/O hiatal hernia    Headache 09/24/2018   Heart murmur    Heme positive stool 10/06/2022   Hyperlipemia    Hypertension    Iron deficiency anemia 09/25/2020   Lumbar radicular pain 08/08/2022   Lumbar radiculopathy 12/18/2020   Mild aortic stenosis 05/16/2019   Mixed dyslipidemia 06/10/2015   Moderate aortic stenosis 05/16/2019   Nausea and vomiting 06/16/2020   occasional   Neurogenic claudication 12/18/2020   OSA (obstructive sleep apnea) 02/09/2017   Pain and swelling of toe of right foot 07/15/2016   Palpitations 06/02/2016   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 Mild pancolonic diverticulosis. Highly redundant colon.    DILATION AND CURETTAGE OF UTERUS     one   ENDOSCOPIC MUCOSAL RESECTION N/A  03/22/2021   Procedure: ENDOSCOPIC MUCOSAL RESECTION;  Surgeon: Wilhelmenia Aloha Raddle., MD;  Location: [facility];  Service: Gastroenterology;  Laterality: N/A;   ENTEROSCOPY N/A 10/06/2022   Procedure: ENTEROSCOPY;  Surgeon: Charlanne Groom, MD;  Location: [facility];  Service: Gastroenterology;  Laterality: N/A;   ESOPHAGOGASTRODUODENOSCOPY  08/17/2015   Moderate hiatal hernia. Otherwise noraml EGD.    ESOPHAGOGASTRODUODENOSCOPY  09/03/2019   [facility]   ESOPHAGOGASTRODUODENOSCOPY (EGD) WITH PROPOFOL  N/A 03/22/2021   Procedure:  ESOPHAGOGASTRODUODENOSCOPY (EGD) WITH PROPOFOL ;  Surgeon: Wilhelmenia Aloha Raddle., MD;  Location: [facility];  Service: Gastroenterology;  Laterality: N/A;   EUS  05/19/2011   Procedure: UPPER ENDOSCOPIC ULTRASOUND (EUS) LINEAR;  Surgeon: Toribio Cedar, MD;  Location: [facility];  Service: Endoscopy;  Laterality: N/A;  radial linear    GIVENS CAPSULE STUDY N/A 10/06/2022   Procedure: GIVENS CAPSULE STUDY;  Surgeon: Charlanne Groom, MD;  Location: [facility];  Service: Gastroenterology;  Laterality: N/A;   HEMOSTASIS CLIP PLACEMENT  03/22/2021   Procedure: HEMOSTASIS CLIP PLACEMENT;  Surgeon: Wilhelmenia Aloha Raddle., MD;  Location: [facility];  Service: Gastroenterology;;   HERNIA REPAIR  05/09/2020   HOT HEMOSTASIS N/A 10/06/2022   Procedure: HOT HEMOSTASIS (ARGON PLASMA COAGULATION/BICAP);  Surgeon: Charlanne Groom, MD;  Location: [facility];  Service: Gastroenterology;  Laterality: N/A;   HYSTEROTOMY     KNEE SURGERY     LAPAROSCOPIC PARAESOPHAGEAL HERNIA REPAIR  12/07/2019   LEFT HEART CATH AND CORONARY ANGIOGRAPHY     LEFT HEART CATH AND CORONARY ANGIOGRAPHY N/A 05/08/2019   Procedure: LEFT HEART CATH AND CORONARY ANGIOGRAPHY;  Surgeon: Verlin Lonni BIRCH, MD;  Location: [facility];  Service: Cardiovascular;  Laterality: N/A;   right thumb     tendon repair   RIGHT/LEFT HEART CATH AND CORONARY ANGIOGRAPHY N/A 06/23/2020   Procedure: RIGHT/LEFT HEART CATH AND CORONARY ANGIOGRAPHY;  Surgeon: Swaziland, Peter M, MD;  Location: [facility] CV LAB;  Service: Cardiovascular;  Laterality: N/A;   SHOULDER SURGERY     rotator cuff repair   SUBMUCOSAL LIFTING INJECTION  03/22/2021   Procedure: SUBMUCOSAL LIFTING INJECTION;  Surgeon: Wilhelmenia Aloha Raddle., MD;  Location: [facility];  Service: Gastroenterology;;   TRANSFORAMINAL LUMBAR INTERBODY FUSION L4-5   02/27/2020   WRIST SURGERY Right    Patient Active Problem List   Diagnosis Date Noted   Cancer [facility])    Chronic  kidney disease    Depression    Heme positive stool 10/06/2022   Preop cardiovascular exam 09/12/2022   Lumbar radicular pain 08/08/2022   Allergy 03/29/2021   Lumbar radiculopathy 12/18/2020   Neurogenic claudication 12/18/2020   Iron deficiency anemia 09/25/2020   Angina pectoris (HCC) 06/17/2020   Nausea and vomiting 06/16/2020   Anxiety    Arthritis    Bronchitis    GERD (gastroesophageal reflux disease)    H/O hiatal hernia    Heart murmur    Hyperlipemia    Hypertension    Shortness of breath    Sleep apnea    Aftercare following surgery 03/26/2020   S/P lumbar fusion 03/26/2020   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   Gastroparesis 09/04/2019   Esophageal dysphagia 09/02/2019   Chronic bilateral low back pain without sciatica 07/11/2019   Mild aortic stenosis 05/16/2019   Moderate aortic stenosis 05/16/2019   Chest pain 05/08/2019   Chest pain of uncertain etiology    CAD (coronary artery disease) 02/13/2019   Headache 09/24/2018   Asthma 10/05/2017   Chronic obstructive pulmonary disease (  HCC) 02/09/2017   OSA (obstructive sleep apnea) 02/09/2017   COPD (chronic obstructive pulmonary disease) (HCC) 02/09/2017   Pain and swelling of toe of right foot 07/15/2016   Palpitations 06/02/2016   Diabetes mellitus due to underlying condition with unspecified complications (HCC) 06/10/2015   Mixed dyslipidemia 06/10/2015   Essential hypertension 06/10/2015   Coronary artery disease involving native coronary artery of native heart without angina pectoris 06/10/2015   Dyslipidemia 06/10/2015   PCP: Gable Cambric, MD  REFERRING PROVIDER: Cara Elida HERO, NP  REFERRING DIAG: R15.9 (ICD-10-CM) - Incontinence of feces, unspecified fecal incontinence type K64.9 (ICD-10-CM) - Hemorrhoids, unspecified hemorrhoid type   THERAPY DIAG:  Muscle weakness (generalized)  Rationale for Evaluation and Treatment: Rehabilitation  ONSET DATE: 2024  SUBJECTIVE:                                                                                                                                                                                            SUBJECTIVE STATEMENT: Patient 20 minutes late to her PT eval Patient reports that she has had hemorrhoids and sores, she is not sure what is causing it. Maybe something she eats, she is not sure Patient reports that she is constipated at times, prefers it, because it flares up when she has lose stool.  Has to sit there for a while  15 mins or  Has to take 23 medications / day, OXY and tramadol, does not like to take her fiber, because it is one more thing Her hip hurts and her leg hurts, pain medicine does not help  Back and hip hurts 10/ 10 most of the time Patient does not drive, her daughter drives her and lives with her, does most things around the house, patient might do some cooking.  She walks with a rollator.  Fluid intake:   PAIN:  Are you having pain? Yes NPRS scale: 10/10- right lower extremity and hip Pain location: anus- sores and external  Pain type: aching Pain description: intermittent   Aggravating factors: lose stools Relieving factors: sitz baths, creams  PRECAUTIONS: None  RED FLAGS: None   WEIGHT BEARING RESTRICTIONS: No  FALLS:  Has patient fallen in last 6 months? No  OCCUPATION: retired  ACTIVITY LEVEL : not very active  PLOF: Independent with household mobility with device  PATIENT GOALS: she does not know  PERTINENT HISTORY:        ABDOMINAL HYSTERECTOMY      BIOPSY  03/22/2021 Procedure: BIOPSY;  Surgeon: Wilhelmenia, Aloha Raddle., MD;  Location: [facility];  Service: Gastroenterology;;   BREAST SURGERY   begin mass,left  breast   COLONOSCOPY  08/17/2015 Colonic polyp status post polypectomy. Mild pancolonic diverticulosis. Highly redundant colon.   DILATION AND CURETTAGE OF UTERUS   one   ENDOSCOPIC MUCOSAL RESECTION N/A 03/22/2021 Procedure: ENDOSCOPIC MUCOSAL  RESECTION;  Surgeon: Wilhelmenia Aloha Raddle., MD;  [facility];  Service: Gastroenterology;  Laterality: N/A;   ENTEROSCOPY N/A 10/06/2022 Procedure: ENTEROSCOPY;  Surgeon: Charlanne Groom, MD;  [facility];  Service: Gastroenterology;  Laterality: N/A;   ESOPHAGOGASTRODUODENOSCOPY  08/17/2015 Moderate hiatal hernia. Otherwise noraml EGD.   ESOPHAGOGASTRODUODENOSCOPY  09/03/2019 [facility]   ESOPHAGOGASTRODUODENOSCOPY (EGD) WITH PROPOFOL  N/A 03/22/2021 Procedure: ESOPHAGOGASTRODUODENOSCOPY (EGD) WITH PROPOFOL ;  Surgeon: Wilhelmenia Aloha Raddle., MD;  [facility];  Service: Gastroenterology;  Laterality: N/A;   EUS  05/19/2011 Procedure: UPPER ENDOSCOPIC ULTRASOUND (EUS) LINEAR;  Surgeon: Toribio Cedar, MD;  Location: [facility];  Service: Endoscopy;  Laterality: N/A;  radial linear   GIVENS CAPSULE STUDY N/A 10/06/2022 Procedure: GIVENS CAPSULE STUDY;  Surgeon: Charlanne Groom, MD;  Location: [facility];  Service: Gastroenterology;  Laterality: N/A;   HEMOSTASIS CLIP PLACEMENT  03/22/2021 Procedure: HEMOSTASIS CLIP PLACEMENT;  Surgeon: Wilhelmenia Aloha Raddle., MD;  [facility]: [facility];  Service: Gastroenterology;;   HERNIA REPAIR  05/09/2020    HOT HEMOSTASIS N/A 10/06/2022 Procedure: HOT HEMOSTASIS (ARGON PLASMA COAGULATION/BICAP);  Surgeon: Charlanne Groom, MD;  Location: [facility];  Service: Gastroenterology;  Laterality: N/A;   HYSTEROTOMY      KNEE SURGERY      LAPAROSCOPIC PARAESOPHAGEAL HERNIA REPAIR  12/07/2019    LEFT HEART CATH AND CORONARY ANGIOGRAPHY      LEFT HEART CATH AND CORONARY ANGIOGRAPHY N/A 05/08/2019 Procedure: LEFT HEART CATH AND CORONARY ANGIOGRAPHY;  Surgeon: Verlin Lonni BIRCH, MD;  Location: [facility];  Service: Cardiovascular;  Laterality: N/A;   right thumb   tendon repair   RIGHT/LEFT HEART CATH AND CORONARY ANGIOGRAPHY N/A 06/23/2020 Procedure: RIGHT/LEFT HEART CATH AND CORONARY ANGIOGRAPHY;  Surgeon:  Swazil[place], Peter M, MD;  Location[facility] CV [facility];  Service: Cardiovascular;  Laterality: N/A;   SHOULDER SURGERY   rotator cuff repair   SUBMUCOSAL LIFTING INJECTION  03/22/2021 Procedure: SUBMUCOSAL LIFTING INJECTION;  Surgeon: Wilhelmenia Aloha Raddle., MD;  Location: [facility];  Service: Gastroenterology;;   TRANSFORAMINAL LUMBAR INTERBODY FUSION L4-5  02/27/2020    WRIST SURGERY Right       Sexual abuse: to be asked  BOWEL MOVEMENT: Pain with bowel movement: Yes at times Type of bowel movement:Type (Bristol Stool Scale) 1 Fully empty rectum: No- sometimes she does Leakage: Yes:   Pads: to be asked Fiber supplement/laxative Yes fiber- but inconsistently  URINATION: Pain with urination: No Fully empty bladder: No Stream: Strong Urgency: Yes  Frequency: yes Leakage: Urge to void, Coughing, and Sneezing Pads: to be asked  INTERCOURSE: to be asked   PREGNANCY: Vaginal deliveries 3  Currently pregnant No  PROLAPSE:to be asked     OBJECTIVE:  Note: Objective measures were completed at Evaluation unless otherwise noted.  DIAGNOSTIC FINDINGS:  None recently  PATIENT SURVEYS:    PFIQ-7: to be filled   COGNITION: Overall cognitive status: Within functional limits for tasks assessed     SENSATION: Light touch: Appears intact  LUMBAR SPECIAL TESTS:  Single leg stance test: unable to stand without support  GAIT: Assistive device utilized: Environmental consultant - 4 wheeled Comments: slow and antalgic  POSTURE: No Significant postural limitations, rounded shoulders, forward head, increased lumbar lordosis, anterior pelvic tilt, right pelvic obliquity, and flexed trunk    LUMBARAROM/PROM: to be assessed  A/PROM A/PROM  eval  Flexion   Extension   Right lateral flexion   Left lateral flexion   Right rotation   Left rotation    (Blank rows = not tested)  LOWER EXTREMITY ROM: to be assessed  Passive ROM Right eval Left eval  Hip flexion    Hip extension    Hip  abduction    Hip adduction    Hip internal rotation    Hip external rotation    Knee flexion    Knee extension    Ankle dorsiflexion    Ankle plantarflexion    Ankle inversion    Ankle eversion     (Blank rows = not tested)  LOWER EXTREMITY MMT:   MMT Right eval Left eval  Hip flexion 4-/5 4-/5  Hip extension    Hip abduction    Hip adduction    Hip internal rotation    Hip external rotation    Knee flexion    Knee extension 4-/5 4-/5  Ankle dorsiflexion    Ankle plantarflexion    Ankle inversion    Ankle eversion     (Blank rows = not tested) PALPATION:   General: tight and tender lumbar scar  Pelvic Alignment: right ASIS elevated  Abdominal: 1 finger DRA                 External Perineal Exam: to be assessed                             Internal Pelvic Floor: to be assessed  Patient confirms identification and approves PT to assess internal pelvic floor and treatment No  PELVIC MMT:   MMT eval  Vaginal   Internal Anal Sphincter   External Anal Sphincter   Puborectalis   Diastasis Recti 1 finger with doming  (Blank rows = not tested)        TONE: To be assessed  PROLAPSE: To be assessed  TODAY'S TREATMENT:                                                                                                                              DATE: 11/27/2023  EVAL EVAL Examination completed, findings reviewed, pt educated on POC, HEP, and female pelvic floor anatomy, reasoning with pelvic floor assessment internally with pt consent. Pt motivated to participate in PT and agreeable to attempt recommendations.     PATIENT EDUCATION:  Education details: Pt was educated on relevant anatomy, exam findings, HEP, expectations of PT   Person educated: Patient Education method: Explanation, Demonstration, Tactile cues, Verbal cues, and Handouts Education comprehension: verbalized understanding, returned demonstration, verbal cues required, tactile cues required, and needs  further education  HOME EXERCISE PROGRAM: Access Code: [email or web address] URL: [email or web address] Date: 11/27/2023 Prepared by: Cori Paiton [place]  Patient Education - Get To Know Your Pelvic Floor- Female - Bowel Emptying Techniques - High-Fiber Diet to Support Pelvic Health -  Bowel Emptying Techniques - Abdominal Massage for Constipation - Abdominal Massage for Constipation  ASSESSMENT:  CLINICAL IMPRESSION: Patient is a 78 y.o. F who was seen today for physical therapy evaluation and treatment for fecal incontinence. Partial eval completed today due to patient being 20 mins late for her appt. She presents with breath holding strategies, 1 finger diastasis rectus with doming, bilateral hip weakness, reported SUI. Exam findings are notable for upper chest  breathing strategies, abdominal weakness with 1 finger diastasis, decreased awareness of pelvic floor muscles. External soft tissues of pelvic floor are to be assessed. Patient demonstrates trunk decreased AROM all planes ( will be formally assessed next visit), bilateral hip weakness, decreased strength in ankles and bilateral knees as well, tightness in lumbar and thoracic spine and pain in right lower extremity. It is difficult for patient to transfer and ambulate due to pain (10/10) and weakness . Discussed findings with patient, recommended education on constipation management, fiver, squatty potty today, patient was educated on pelvic PT and HEP was initiated. Patient's quality of life has been affected, patient will benefit from physical therapy to address deficits, reduce leaking, fecal incontinence and constipation and improve QOL.        OBJECTIVE IMPAIRMENTS: Abnormal gait, cardiopulmonary status limiting activity, decreased activity tolerance, decreased balance, decreased coordination, decreased endurance, decreased knowledge of condition, decreased mobility, difficulty walking, decreased ROM, decreased strength,  hypomobility, increased fascial restrictions, increased muscle spasms, impaired tone, and pain.   ACTIVITY LIMITATIONS: carrying, lifting, bending, sitting, standing, squatting, stairs, transfers, bed mobility, continence, toileting, dressing, locomotion level, and caring for others  PARTICIPATION LIMITATIONS: cleaning, laundry, personal finances, interpersonal relationship, driving, shopping, community activity, occupation, yard work, school, and church  PERSONAL FACTORS: Age, Education, Fitness, Social background, Time since onset of injury/illness/exacerbation, Transportation, and 3+ comorbidities:   are also affecting patient's functional outcome.   REHAB POTENTIAL: Fair    CLINICAL DECISION MAKING: Evolving/moderate complexity  EVALUATION COMPLEXITY: Moderate   GOALS: Goals reviewed with patient? Yes  SHORT TERM GOALS: Target date: 12/25/2023    Pt will be independent and consistent with HEP.   Baseline: Goal status: INITIAL  2.  Patient will report bristol stool scale 3-4 stools Baseline: 1 Goal status: INITIAL  3.  Patient will report max 3/10 right lower extremity pain with walking for 15 mins with LRAD  Baseline:  Goal status: INITIAL  4.  Patient will be able to demonstrate sit to stand transfer 5 times without increased pain Baseline:  Goal status: INITIAL  5.  Patient will be educated in Healthy bowel PT recommendations Baseline:  Goal status: INITIAL   LONG TERM GOALS: Target date: 02/19/2024    Pt will be independent with advanced HEP.    Baseline:  Goal status: INITIAL  2.  Pt will be independent with use of squatty potty, relaxed toileting mechanics, and improved bowel movement techniques in order to increase ease of bowel movements and complete evacuation.   Baseline:  Goal status: INITIAL  3.  Pt will report her BMs are complete due to improved bowel habits and evacuation techniques.  Baseline:  Goal status: INITIAL  4.  Patient will soak 0  pads/ day in order to be able to participate in community activities without embarrassment Baseline:  Goal status: INITIAL  5.  Patient will have 0 fecal accidents/ week in order to be able to participate in community activities without embarrassment Baseline:  Goal status: INITIAL    PLAN:  PT FREQUENCY: 1-2x/week  PT DURATION:  12 weeks  PLANNED INTERVENTIONS: 97110-Therapeutic exercises, 97530- Therapeutic activity, 97112- Neuromuscular re-education, [phone]- Self Care, 02859- Manual therapy, [phone]- Gait training, [phone]- Electrical stimulation (manual), [phone] (1-2 muscles), [id number] (3+ muscles)- Dry Needling, Patient/Family education, Taping, Joint mobilization, Joint manipulation, Spinal manipulation, Spinal mobilization, Manual lymph drainage, Scar mobilization, Cryotherapy, Moist heat, and Biofeedback  PLAN FOR NEXT SESSION: internal pelvic floor muscle assessment, complete eval, initiate strengthening,    Oluwadara Gorman, PT 11/27/2023, 4:42 PM

## 2023-11-27 ENCOUNTER — Ambulatory Visit: Attending: Nurse Practitioner | Admitting: Physical Therapy

## 2023-11-27 ENCOUNTER — Encounter: Payer: Self-pay | Admitting: Physical Therapy

## 2023-11-27 ENCOUNTER — Other Ambulatory Visit: Payer: Self-pay

## 2023-11-27 DIAGNOSIS — M25551 Pain in right hip: Secondary | ICD-10-CM | POA: Insufficient documentation

## 2023-11-27 DIAGNOSIS — R279 Unspecified lack of coordination: Secondary | ICD-10-CM | POA: Insufficient documentation

## 2023-11-27 DIAGNOSIS — M5459 Other low back pain: Secondary | ICD-10-CM | POA: Diagnosis not present

## 2023-11-27 DIAGNOSIS — M6281 Muscle weakness (generalized): Secondary | ICD-10-CM | POA: Diagnosis not present

## 2023-11-27 DIAGNOSIS — M62838 Other muscle spasm: Secondary | ICD-10-CM | POA: Insufficient documentation

## 2023-11-29 DIAGNOSIS — E559 Vitamin D deficiency, unspecified: Secondary | ICD-10-CM | POA: Diagnosis not present

## 2023-11-29 DIAGNOSIS — E785 Hyperlipidemia, unspecified: Secondary | ICD-10-CM | POA: Diagnosis not present

## 2023-11-29 DIAGNOSIS — M129 Arthropathy, unspecified: Secondary | ICD-10-CM | POA: Diagnosis not present

## 2023-11-29 DIAGNOSIS — R748 Abnormal levels of other serum enzymes: Secondary | ICD-10-CM | POA: Diagnosis not present

## 2023-11-29 DIAGNOSIS — E1169 Type 2 diabetes mellitus with other specified complication: Secondary | ICD-10-CM | POA: Diagnosis not present

## 2023-12-01 ENCOUNTER — Telehealth: Payer: Self-pay | Admitting: Cardiology

## 2023-12-01 DIAGNOSIS — I25118 Atherosclerotic heart disease of native coronary artery with other forms of angina pectoris: Secondary | ICD-10-CM | POA: Diagnosis not present

## 2023-12-01 DIAGNOSIS — E559 Vitamin D deficiency, unspecified: Secondary | ICD-10-CM | POA: Diagnosis not present

## 2023-12-01 DIAGNOSIS — J449 Chronic obstructive pulmonary disease, unspecified: Secondary | ICD-10-CM | POA: Diagnosis not present

## 2023-12-01 DIAGNOSIS — Z7951 Long term (current) use of inhaled steroids: Secondary | ICD-10-CM | POA: Diagnosis not present

## 2023-12-01 DIAGNOSIS — R079 Chest pain, unspecified: Secondary | ICD-10-CM | POA: Diagnosis not present

## 2023-12-01 DIAGNOSIS — E1169 Type 2 diabetes mellitus with other specified complication: Secondary | ICD-10-CM | POA: Diagnosis not present

## 2023-12-01 DIAGNOSIS — E785 Hyperlipidemia, unspecified: Secondary | ICD-10-CM | POA: Diagnosis not present

## 2023-12-01 DIAGNOSIS — M129 Arthropathy, unspecified: Secondary | ICD-10-CM | POA: Diagnosis not present

## 2023-12-01 DIAGNOSIS — I2089 Other forms of angina pectoris: Secondary | ICD-10-CM | POA: Diagnosis not present

## 2023-12-01 DIAGNOSIS — Z79899 Other long term (current) drug therapy: Secondary | ICD-10-CM | POA: Diagnosis not present

## 2023-12-01 DIAGNOSIS — R9431 Abnormal electrocardiogram [ECG] [EKG]: Secondary | ICD-10-CM | POA: Diagnosis not present

## 2023-12-01 DIAGNOSIS — Z7982 Long term (current) use of aspirin: Secondary | ICD-10-CM | POA: Diagnosis not present

## 2023-12-01 NOTE — Telephone Encounter (Signed)
 Pt c/o of Chest Pain: STAT if active (IN THIS MOMENT) CP, including tightness, pressure, jaw pain, shoulder/upper arm/back pain, SOB, nausea, and vomiting.  1. Are you having CP right now (tightness, pressure, or discomfort)? Pain in center of breast and off to the right  2. Are you experiencing any other symptoms (ex. SOB, nausea, vomiting, sweating)? Nauseas   3. How long have you been experiencing CP? Since June  4. Is your CP continuous or coming and going? Coming and going  5. Have you taken Nitroglycerin ? Yes, took it last week. Per daughter the nitroglycerin  will relieve the chest pain.   6. If CP returns before callback, please consider calling 911.   Spoke with the daughter and patient on the phone.?

## 2023-12-01 NOTE — Telephone Encounter (Signed)
 Called the patient and she reported that she had been having chest pain/pressure/ache in the center of her chest that comes and goes since May. When she has these episodes she takes a nitroglycerin  and the chest pain/pressure/ache is relieved. The last episode she had was earlier this past morning. She also states that the chest pain/pressure ache will radiate to her back and they last 5-15 minutes. Based on the recurrent episodes of chest pain/pressure/ache and that it is relieved each time with nitroglycerin , I recommended that she go to the ER to be evaluated. Patient verbalized understanding and stated that she would go to the ER.

## 2023-12-05 ENCOUNTER — Ambulatory Visit

## 2023-12-05 DIAGNOSIS — M6281 Muscle weakness (generalized): Secondary | ICD-10-CM

## 2023-12-05 DIAGNOSIS — M25551 Pain in right hip: Secondary | ICD-10-CM

## 2023-12-05 DIAGNOSIS — R279 Unspecified lack of coordination: Secondary | ICD-10-CM

## 2023-12-05 DIAGNOSIS — C673 Malignant neoplasm of anterior wall of bladder: Secondary | ICD-10-CM | POA: Diagnosis not present

## 2023-12-05 DIAGNOSIS — M62838 Other muscle spasm: Secondary | ICD-10-CM | POA: Diagnosis not present

## 2023-12-05 DIAGNOSIS — M5459 Other low back pain: Secondary | ICD-10-CM

## 2023-12-05 NOTE — Patient Instructions (Signed)
 Squatty potty: When your knees are level or below the level of your hips, pelvic floor muscles are pressed against rectum, preventing ease of bowel movement. By getting knees above the level of the hips, these pelvic floor muscles relax, allowing easier passage of bowel movement. Ways to get knees above hips: o Squatty Potty (7inch and 9inch versions) o Small stool o Roll of toilet paper under each foot o Hardback book or stack of magazines under each foot  Relaxed Toileting mechanics: Once in this position, make sure to lean forward with forearms on thighs, wide knees, relaxed stomach, and breathe.   The knack: Use this technique while coughing, laughing, sneezing, or with any activities that causes you to leak urine a little. Right before you perform one of these activities that increase pressure in the abdomen and pushes a little urine out, perform a pelvic floor muscle contraction and hold. If that does not completely stop the leaking, try tightening your thighs together in addition to performing a pelvic floor muscle contraction. Make sure you are not trying to stifle a cough, sneeze, or laugh; allow these activities in full as it will cause less pressure down into the bladder and pelvic floor muscles.     Urge Incontinence  Ideal urination frequency is every 2-4 wakeful hours, which equates to 5-8 times within a 24-hour period.   Urge incontinence is leakage that occurs when the bladder muscle contracts, creating a sudden need to go before getting to the bathroom.   Going too often when your bladder isn't actually full can disrupt the body's automatic signals to store and hold urine longer, which will increase urgency/frequency.  In this case, the bladder "is running the show" and strategies can be learned to retrain this pattern.   One should be able to control the first urge to urinate, at around .  The bladder can hold up to a "grande latte," or . To help you  gain control, practice the Urge Drill below when urgency strikes.  This drill will help retrain your bladder signals and allow you to store and hold urine longer.  The overall goal is to stretch out your time between voids to reach a more manageable voiding schedule.    Practice your quick flicks often throughout the day (each waking hour) even when you don't need feel the urge to go.  This will help strengthen your pelvic floor muscles, making them more effective in controlling leakage.  Urge Drill  When you feel an urge to go, follow these steps to regain control: Stop what you are doing and be still Take one deep breath, directing your air into your abdomen Think an affirming thought, such as "I've got this." Do 5 quick flicks of your pelvic floor Walk with control to the bathroom to void, or delay voiding    [facility] [address] [place], [place] [address] Phone # [phone] Fax [phone]

## 2023-12-05 NOTE — Therapy (Signed)
 OUTPATIENT PHYSICAL THERAPY FEMALE PELVIC EVALUATION   Patient Name: Deborah Hutchinson MRN: [id number] DOB:1945-08-21, 78 y.o., female Today's Date: 12/05/2023  END OF SESSION:  PT End of Session - 12/05/23 0933     Visit Number 2    Date for PT Re-Evaluation 02/19/24    Authorization Type [employer] dual medicare - no auth    Progress Note Due on Visit 10    PT Start Time 0931    PT Stop Time 1010    PT Time Calculation (min) 39 min    Activity Tolerance Patient tolerated treatment well    Behavior During Therapy [facility] for tasks assessed/performed           Past Medical History:  Diagnosis Date   Aftercare following surgery 03/26/2020   Allergy 03/29/2021   Angina pectoris (HCC) 06/17/2020   Anxiety    Arthritis    all over   Asthma    Follows w/ Dr. Marina, pulmonologist.   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   Chronic kidney disease    Chronic obstructive pulmonary disease (HCC) 02/09/2017   Formatting of this note might be different from the original. Last Assessment & Plan:  I will review her PFTs after I obtain records from her pulmonologist. Based on this we will decide if she needs inhalers, subjectively they do not seem to have helped her at all. She may benefit from a pulmonary rehabilitation program Formatting of this note might be different from the original. Last Assessment    COPD (chronic obstructive pulmonary disease) (HCC) 02/09/2017   Coronary artery disease involving native coronary artery of native heart without angina pectoris 06/10/2015   Depression    Diabetes mellitus due to underlying condition with unspecified complications (HCC) 06/10/2015   Dyslipidemia 06/10/2015   Esophageal dysphagia  09/02/2019   Formatting of this note might be different from the original.  Added automatically from request for surgery [id number]     Essential hypertension 06/10/2015   Gastroparesis 09/04/2019   GERD (gastroesophageal reflux disease)    H/O hiatal hernia    Headache 09/24/2018   Heart murmur    Heme positive stool 10/06/2022   Hyperlipemia    Hypertension    Iron deficiency anemia 09/25/2020   Lumbar radicular pain 08/08/2022   Lumbar radiculopathy 12/18/2020   Mild aortic stenosis 05/16/2019   Mixed dyslipidemia 06/10/2015   Moderate aortic stenosis 05/16/2019   Nausea and vomiting 06/16/2020   occasional   Neurogenic claudication 12/18/2020   OSA (obstructive sleep apnea) 02/09/2017   Pain and swelling of toe of right foot 07/15/2016   Palpitations 06/02/2016   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 Mild pancolonic diverticulosis. Highly redundant colon.  DILATION AND CURETTAGE OF UTERUS     one   ENDOSCOPIC MUCOSAL RESECTION N/A 03/22/2021   Procedure: ENDOSCOPIC MUCOSAL RESECTION;  Surgeon: Wilhelmenia Aloha Raddle., MD;  Location: [facility];  Service: Gastroenterology;  Laterality: N/A;   ENTEROSCOPY N/A 10/06/2022   Procedure: ENTEROSCOPY;  Surgeon: Charlanne Groom, MD;  Location: [facility];  Service: Gastroenterology;  Laterality: N/A;   ESOPHAGOGASTRODUODENOSCOPY  08/17/2015   Moderate hiatal hernia. Otherwise noraml EGD.    ESOPHAGOGASTRODUODENOSCOPY  09/03/2019   [facility]    ESOPHAGOGASTRODUODENOSCOPY (EGD) WITH PROPOFOL  N/A 03/22/2021   Procedure: ESOPHAGOGASTRODUODENOSCOPY (EGD) WITH PROPOFOL ;  Surgeon: Wilhelmenia Aloha Raddle., MD;  Location: [facility];  Service: Gastroenterology;  Laterality: N/A;   EUS  05/19/2011   Procedure: UPPER ENDOSCOPIC ULTRASOUND (EUS) LINEAR;  Surgeon: Toribio Cedar, MD;  Location: [facility];  Service: Endoscopy;  Laterality: N/A;  radial linear    GIVENS CAPSULE STUDY N/A 10/06/2022   Procedure: GIVENS CAPSULE STUDY;  Surgeon: Charlanne Groom, MD;  Location: [facility];  Service: Gastroenterology;  Laterality: N/A;   HEMOSTASIS CLIP PLACEMENT  03/22/2021   Procedure: HEMOSTASIS CLIP PLACEMENT;  Surgeon: Wilhelmenia Aloha Raddle., MD;  Location: [facility];  Service: Gastroenterology;;   HERNIA REPAIR  05/09/2020   HOT HEMOSTASIS N/A 10/06/2022   Procedure: HOT HEMOSTASIS (ARGON PLASMA COAGULATION/BICAP);  Surgeon: Charlanne Groom, MD;  Location: [facility];  Service: Gastroenterology;  Laterality: N/A;   HYSTEROTOMY     KNEE SURGERY     LAPAROSCOPIC PARAESOPHAGEAL HERNIA REPAIR  12/07/2019   LEFT HEART CATH AND CORONARY ANGIOGRAPHY     LEFT HEART CATH AND CORONARY ANGIOGRAPHY N/A 05/08/2019   Procedure: LEFT HEART CATH AND CORONARY ANGIOGRAPHY;  Surgeon: Verlin Lonni BIRCH, MD;  Location: [facility];  Service: Cardiovascular;  Laterality: N/A;   right thumb     tendon repair   RIGHT/LEFT HEART CATH AND CORONARY ANGIOGRAPHY N/A 06/23/2020   Procedure: RIGHT/LEFT HEART CATH AND CORONARY ANGIOGRAPHY;  Surgeon: Swazil[place], Peter M, MD;  Location: [facility];  Service: Cardiovascular;  Laterality: N/A;   SHOULDER SURGERY     rotator cuff repair   SUBMUCOSAL LIFTING INJECTION  03/22/2021   Procedure: SUBMUCOSAL LIFTING INJECTION;  Surgeon: Wilhelmenia Aloha Raddle., MD;  Location: [facility];  Service: Gastroenterology;;   TRANSFORAMINAL LUMBAR INTERBODY FUSION L4-5   02/27/2020   WRIST SURGERY Right    Patient  Active Problem List   Diagnosis Date Noted   Cancer [facility])    Chronic kidney disease    Depression    Heme positive stool 10/06/2022   Preop cardiovascular exam 09/12/2022   Lumbar radicular pain 08/08/2022   Allergy 03/29/2021   Lumbar radiculopathy 12/18/2020   Neurogenic claudication 12/18/2020   Iron deficiency anemia 09/25/2020   Angina pectoris (HCC) 06/17/2020   Nausea and vomiting 06/16/2020   Anxiety    Arthritis    Bronchitis    GERD (gastroesophageal reflux disease)    H/O hiatal hernia    Heart murmur    Hyperlipemia    Hypertension    Shortness of breath    Sleep apnea    Aftercare following surgery 03/26/2020   S/P lumbar fusion 03/26/2020   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   Gastroparesis 09/04/2019   Esophageal dysphagia 09/02/2019   Chronic bilateral low back pain without sciatica 07/11/2019   Mild aortic stenosis 05/16/2019   Moderate aortic stenosis 05/16/2019   Chest pain 05/08/2019   Chest pain of uncertain etiology    CAD (coronary artery  disease) 02/13/2019   Headache 09/24/2018   Asthma 10/05/2017   Chronic obstructive pulmonary disease (HCC) 02/09/2017   OSA (obstructive sleep apnea) 02/09/2017   COPD (chronic obstructive pulmonary disease) (HCC) 02/09/2017   Pain and swelling of toe of right foot 07/15/2016   Palpitations 06/02/2016   Diabetes mellitus due to underlying condition with unspecified complications (HCC) 06/10/2015   Mixed dyslipidemia 06/10/2015   Essential hypertension 06/10/2015   Coronary artery disease involving native coronary artery of native heart without angina pectoris 06/10/2015   Dyslipidemia 06/10/2015   PCP: Gable Cambric, MD  REFERRING PROVIDER: Cara Elida HERO, NP  REFERRING DIAG: R15.9 (ICD-10-CM) - Incontinence of feces, unspecified fecal incontinence type K64.9 (ICD-10-CM) - Hemorrhoids, unspecified hemorrhoid type   THERAPY DIAG:  Muscle weakness (generalized)  Other low back  pain  Pain in right hip  Unspecified lack of coordination  Other muscle spasm  Rationale for Evaluation and Treatment: Rehabilitation  ONSET DATE: 2024  SUBJECTIVE:                                                                                                                                                                                           SUBJECTIVE STATEMENT: Pt states that she is actively in treatment for bladder cancer. She is going to day for a treatment. She was diagnosed with bladder cancer 3-4 months ago.   She leaks stool in the bed when she has diarrhea. Sometimes she leaks stool when she does not have an urge.   PAIN: 12/05/23 Are you having pain? Yes NPRS scale: 10/10- right lower extremity and hip Pain location: low back; anus- sores and external  Pain type: aching Pain description: intermittent   Aggravating factors: lose stools Relieving factors: sitz baths, creams  PRECAUTIONS: Other: bladder cancer - treatments 3x/week 12/05/23  RED FLAGS: None   WEIGHT BEARING RESTRICTIONS: No  FALLS:  Has patient fallen in last 6 months? Yes. Number of falls pt isn't sure exactly how many in the last 6 months, but in the last year she states that she has fallen 6-7 times 12/04/23  OCCUPATION: retired  ACTIVITY LEVEL : not very active  PLOF: Independent with household mobility with device  PATIENT GOALS: she does not know  PERTINENT HISTORY: bladder cancer - in active treatment (pt unsure what she is getting for treatment), abdominal hysterectomy, hernia repair x 2, lumbar fusion, COPD, hx of cancer, anxiety, depression, gastroparesis, neurogenic claudication, diabetes, sleep apnea   Sexual abuse: to be asked  BOWEL MOVEMENT: Pain with bowel movement: Yes at times Type of bowel movement:Type (Bristol Stool Scale) 1 Fully empty rectum: No- sometimes she does  Leakage: Yes:   Pads: to be asked Fiber supplement/laxative Yes fiber- but  inconsistently  URINATION: Pain with urination: No Fully empty bladder: No Stream: Strong Urgency: Yes  Frequency: yes Leakage: Urge to void, Coughing, and Sneezing Pads: to be asked  INTERCOURSE: to be asked   PREGNANCY: Vaginal deliveries 3  Currently pregnant No  PROLAPSE:to be asked     OBJECTIVE:  Note: Objective measures were completed at Evaluation unless otherwise noted. 12/05/23:               External Perineal Exam: dryness, phimosis, irritation surrounding anus, hemorrhoids                             Internal Pelvic Floor: WNL, no pain  Patient confirms identification and approves PT to assess internal pelvic floor and treatment No  PELVIC MMT:   MMT eval  Vaginal 2/5, 2 second hold, 7 repeat contractions  Internal Anal Sphincter 2/5  External Anal Sphincter 2/5  Puborectalis 2/5  (Blank rows = not tested)        TONE: low  PROLAPSE: WNL tested in the morning in [facility]   11/27/23 DIAGNOSTIC FINDINGS:  None recently  PATIENT SURVEYS:    PFIQ-7: to be filled   COGNITION: Overall cognitive status: Within functional limits for tasks assessed     SENSATION: Light touch: Appears intact  LUMBAR SPECIAL TESTS:  Single leg stance test: unable to stand without support  GAIT: Assistive device utilized: Environmental consultant - 4 wheeled Comments: slow and antalgic  POSTURE: No Significant postural limitations, rounded shoulders, forward head, increased lumbar lordosis, anterior pelvic tilt, right pelvic obliquity, and flexed trunk    LUMBARAROM/PROM: to be assessed  A/PROM A/PROM  eval  Flexion   Extension   Right lateral flexion   Left lateral flexion   Right rotation   Left rotation    (Blank rows = not tested)  LOWER EXTREMITY ROM: to be assessed  Passive ROM Right eval Left eval  Hip flexion    Hip extension    Hip abduction    Hip adduction    Hip internal rotation    Hip external rotation    Knee flexion    Knee extension    Ankle  dorsiflexion    Ankle plantarflexion    Ankle inversion    Ankle eversion     (Blank rows = not tested)  LOWER EXTREMITY MMT:   MMT Right eval Left eval  Hip flexion 4-/5 4-/5  Hip extension    Hip abduction    Hip adduction    Hip internal rotation    Hip external rotation    Knee flexion    Knee extension 4-/5 4-/5  Ankle dorsiflexion    Ankle plantarflexion    Ankle inversion    Ankle eversion     (Blank rows = not tested) PALPATION:   General: tight and tender lumbar scar  Pelvic Alignment: right ASIS elevated  Abdominal: 1 finger DRA                 External Perineal Exam: to be assessed                             Internal Pelvic Floor: to be assessed  Patient confirms identification and approves PT to assess internal pelvic floor and treatment No  PELVIC MMT:   MMT eval  Vaginal  Internal Anal Sphincter   External Anal Sphincter   Puborectalis   Diastasis Recti 1 finger with doming  (Blank rows = not tested)        TONE: To be assessed  PROLAPSE: To be assessed  TODAY'S TREATMENT:                                                                                                                              DATE:  12/05/23 Manual: Pt provides verbal consent for internal vaginal/rectal pelvic floor exam. Internal vaginal pelvic floor muscle assessment Internal rectal pelvic floor muscle assessment  Neuromuscular re-education: Pt provides verbal consent for internal vaginal/rectal pelvic floor exam. Internal vaginal pelvic floor muscle training Quick flicks 2 x 10 Long holds 5 x 10 seconds Urge drill The knack Therapeutic activities: Squatty potty Discussed if home health would be beneficial since she has difficult time getting to PT and lives far away - we will reach out to PCP and make aware of the situation Review of abdominal massage    11/27/2023  EVAL EVAL Examination completed, findings reviewed, pt educated on POC, HEP, and female  pelvic floor anatomy, reasoning with pelvic floor assessment internally with pt consent. Pt motivated to participate in PT and agreeable to attempt recommendations.     PATIENT EDUCATION:  Education details: Pt was educated on relevant anatomy, exam findings, HEP, expectations of PT   Person educated: Patient Education method: Explanation, Demonstration, Tactile cues, Verbal cues, and Handouts Education comprehension: verbalized understanding, returned demonstration, verbal cues required, tactile cues required, and needs further education  HOME EXERCISE PROGRAM: Access Code: [email or web address] URL: [email or web address] Date: 11/27/2023 Prepared by: Cori Helmus  Patient Education - Get To Know Your Pelvic Floor- Female - Bowel Emptying Techniques - High-Fiber Diet to Support Pelvic Health - Bowel Emptying Techniques - Abdominal Massage for Constipation - Abdominal Massage for Constipation  [id number]: strengthening program (split things up to help make things a little less confusing for patient)  ASSESSMENT:  CLINICAL IMPRESSION: Patient is a 78 y.o. F who was seen today for physical therapy evaluation and treatment for fecal incontinence. Internal vaginal and rectal pelvic floor muscle assessments performed today and pt found to have weakness, decreased endurance, and difficulty with appropriate coordination. We performed pelvic floor muscle contraction training with strengthening program and the urge and the knack in order to help improving continence. We reviewed squatty potty and abdominal massage in order to help improve complete bowel evacuation to help decrease fecal incontinence. She reports that it is very difficult to get to PT here and it is far away from her. She will benefit from home health PT and we will reach out to Pcp about possibly getting this set up for patient; we did go over that she cannot attend PT here and have home health, but she reports preferring home  health. Due to significant number of falls, believe this should be prioritized over pelvic floor physical therapy; working on  generalized strengthening will also likely improve continence for bowel and bladder, especially with several sessions initially in order to better train pelvic floor muscle contractions. She did well with pelvic floor muscle contraction training today, but was unable to coordinate with breathing at this time. Patient will continue benefit from physical therapy to address deficits, reduce fecal and urinary incontinence, decrease constipation, improve low back/Rt hip pain, and improve QOL.        OBJECTIVE IMPAIRMENTS: Abnormal gait, cardiopulmonary status limiting activity, decreased activity tolerance, decreased balance, decreased coordination, decreased endurance, decreased knowledge of condition, decreased mobility, difficulty walking, decreased ROM, decreased strength, hypomobility, increased fascial restrictions, increased muscle spasms, impaired tone, and pain.   ACTIVITY LIMITATIONS: carrying, lifting, bending, sitting, standing, squatting, stairs, transfers, bed mobility, continence, toileting, dressing, locomotion level, and caring for others  PARTICIPATION LIMITATIONS: cleaning, laundry, personal finances, interpersonal relationship, driving, shopping, community activity, occupation, yard work, school, and church  PERSONAL FACTORS: Age, Education, Fitness, Social background, Time since onset of injury/illness/exacerbation, Transportation, and 3+ comorbidities:   are also affecting patient's functional outcome.   REHAB POTENTIAL: Fair    CLINICAL DECISION MAKING: Evolving/moderate complexity  EVALUATION COMPLEXITY: Moderate   GOALS: Goals reviewed with patient? Yes  SHORT TERM GOALS: Target date: 12/25/2023    Pt will be independent and consistent with HEP.   Baseline: Goal status: INITIAL  2.  Patient will report bristol stool scale 3-4 stools Baseline:  1 Goal status: INITIAL  3.  Patient will report max 3/10 right lower extremity pain with walking for 15 mins with LRAD  Baseline:  Goal status: INITIAL  4.  Patient will be able to demonstrate sit to stand transfer 5 times without increased pain Baseline:  Goal status: INITIAL  5.  Patient will be educated in Healthy bowel PT recommendations Baseline:  Goal status: INITIAL   LONG TERM GOALS: Target date: 02/19/2024    Pt will be independent with advanced HEP.    Baseline:  Goal status: INITIAL  2.  Pt will be independent with use of squatty potty, relaxed toileting mechanics, and improved bowel movement techniques in order to increase ease of bowel movements and complete evacuation.   Baseline:  Goal status: INITIAL  3.  Pt will report her BMs are complete due to improved bowel habits and evacuation techniques.  Baseline:  Goal status: INITIAL  4.  Patient will soak 0 pads/ day in order to be able to participate in community activities without embarrassment Baseline:  Goal status: INITIAL  5.  Patient will have 0 fecal accidents/ week in order to be able to participate in community activities without embarrassment Baseline:  Goal status: INITIAL    PLAN:  PT FREQUENCY: 1-2x/week  PT DURATION: 12 weeks  PLANNED INTERVENTIONS: 97110-Therapeutic exercises, 97530- Therapeutic activity, 97112- Neuromuscular re-education, 97535- Self Care, 02859- Manual therapy, [phone]- Gait training, [phone]- Electrical stimulation (manual), [phone] (1-2 muscles), [phone] (3+ muscles)- Dry Needling, Patient/Family education, Taping, Joint mobilization, Joint manipulation, Spinal manipulation, Spinal mobilization, Manual lymph drainage, Scar mobilization, Cryotherapy, Moist heat, and Biofeedback  PLAN FOR NEXT SESSION: internal pelvic floor muscle assessment, complete eval, initiate strengthening,    Josette Mares, PT, DPT07/29/2510:29 AM

## 2023-12-06 DIAGNOSIS — R0789 Other chest pain: Secondary | ICD-10-CM | POA: Diagnosis not present

## 2023-12-06 DIAGNOSIS — Z09 Encounter for follow-up examination after completed treatment for conditions other than malignant neoplasm: Secondary | ICD-10-CM | POA: Diagnosis not present

## 2023-12-06 DIAGNOSIS — M179 Osteoarthritis of knee, unspecified: Secondary | ICD-10-CM | POA: Diagnosis not present

## 2023-12-11 ENCOUNTER — Encounter: Payer: Self-pay | Admitting: Physical Therapy

## 2023-12-11 ENCOUNTER — Ambulatory Visit: Attending: Nurse Practitioner | Admitting: Physical Therapy

## 2023-12-11 ENCOUNTER — Telehealth: Payer: Self-pay

## 2023-12-11 ENCOUNTER — Ambulatory Visit: Admitting: Physical Therapy

## 2023-12-11 ENCOUNTER — Other Ambulatory Visit: Payer: Self-pay

## 2023-12-11 DIAGNOSIS — M62838 Other muscle spasm: Secondary | ICD-10-CM | POA: Insufficient documentation

## 2023-12-11 DIAGNOSIS — R159 Full incontinence of feces: Secondary | ICD-10-CM

## 2023-12-11 DIAGNOSIS — R279 Unspecified lack of coordination: Secondary | ICD-10-CM | POA: Diagnosis not present

## 2023-12-11 DIAGNOSIS — M25551 Pain in right hip: Secondary | ICD-10-CM | POA: Diagnosis not present

## 2023-12-11 DIAGNOSIS — M5459 Other low back pain: Secondary | ICD-10-CM | POA: Diagnosis not present

## 2023-12-11 DIAGNOSIS — M6281 Muscle weakness (generalized): Secondary | ICD-10-CM | POA: Diagnosis not present

## 2023-12-11 NOTE — Telephone Encounter (Signed)
-----   Message from Elida CHRISTELLA Shawl sent at 12/11/2023  1:56 PM EDT ----- Regarding: FW: Home health PT Jordani Nunn/Dottie, can you enter a home health referral for pelvic floor physical therapy?  Patient will require referral from her PCP for orthopedic physical therapy. ----- Message ----- From: Russell Josette LABOR, PT Sent: 12/11/2023   1:40 PM EDT To: Elida CHRISTELLA Shawl, NP Subject: RE: Home health PT                             Hi! No we are fine to see her under her current authorization for pelvic floor physical therapy. We were just hoping that she could get a referral for home health. We will keep seeing her until she is able to start home health.  Thank you ----- Message ----- From: Shawl Elida CHRISTELLA, NP Sent: 12/11/2023   1:08 PM EDT To: Josette LABOR Russell, PT Subject: FW: Home health PT                             Hi Kristen, I apologize for the delay in my response as I have been out of the office for the past 1 1/2 weeks. Please clarify, do you need our office to update the prior pelvic floor physical therapy request to specifically request you for outpatient physical therapy for a few sessions then patient will continue taught exercises at home regarding pelvic flood dysfunction?  Colleen ----- Message ----- From: Russell Josette LABOR, PT Sent: 12/05/2023  11:00 AM EDT To: Elida CHRISTELLA Shawl, NP Subject: Home health PT                                 Good morning, I am seeing this patient for pelvic floor physical therapy. We are also going to see her for orthopedic PT because she has fallen many times and has a very difficult time with balance and strength. She shared today that she has to travel a long distance to get here, relies on her daughter for transportation, and has a difficult time with mobility. Due to this, I really think she would benefit from home health PT vs coming into our office; she said she would like this option. I realize that pelvic floor physical  therapy cannot be done in home health, but I believe with a few visits here that she will have had before starting home health will be beneficial and then just working on overall strengthening in home health will help resolve incontinence issues as well. I wasn't sure what the next steps should be in order to set this up for her? Thanks for your help, Monica Russell

## 2023-12-11 NOTE — Patient Instructions (Signed)
 Healthy Bowel PT recommendations   Choose the best time of day to have a bowel movement:  Usually the best time of day for a bowel movement will be a half hour to an hour after breakfast.  For some people, a half hour to an hour after lunch will work better.  These times are best because the body uses the gastrocolic reflex, a stimulation of bowel motion that occurs with eating, to help produce a bowel movement.  Make sure that you are not rushed and have convenient access to a bathroom at your selected time.  Eat all your meals at a predictable time each day.  The bowel functions best when food is introduced at the same regular intervals.  The amount of food eaten at a given time of day should be about the same size from day to day.  The bowel functions best when food is introduced in similar quantities from day to day.  It is fine to have a small breakfast and a large lunch, or vice versa, just be consistent.  Eat two servings of fruit or vegetables and at least one serving of complex carbohydrates (whole grains such as brown rice, bran, whole wheat bread, or oatmeal) at each meal.   A serving of fruit or vegetables is a half-cup or medium-sized piece of fruit.  A serving of a complex carbohydrate is a half-cup or a slice of bread.  It is often desirable to eat more than the recommended minimum amounts of fruits, vegetables, and complex carbohydrates.  Drink plenty of water - half of your weight in ounces/ day.   Until regular bowel movements are established at a desired time of day, take 2-3 dried prunes (or  to 1/3 cup of prune juice) each night to stimulate morning bowel function.  Exercise daily.  You may exercise at any time of day, but you may find that bowel function is helped most if the exercise is at a consistent time each day.

## 2023-12-11 NOTE — Telephone Encounter (Signed)
Referral entered in epic as requested.
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
## 2023-12-12 ENCOUNTER — Ambulatory Visit: Admitting: Physical Therapy

## 2023-12-12 ENCOUNTER — Other Ambulatory Visit: Payer: Self-pay | Admitting: Allergy

## 2023-12-12 ENCOUNTER — Encounter: Payer: Self-pay | Admitting: Physical Therapy

## 2023-12-12 DIAGNOSIS — M25551 Pain in right hip: Secondary | ICD-10-CM

## 2023-12-12 DIAGNOSIS — M6281 Muscle weakness (generalized): Secondary | ICD-10-CM | POA: Diagnosis not present

## 2023-12-12 DIAGNOSIS — R279 Unspecified lack of coordination: Secondary | ICD-10-CM | POA: Diagnosis not present

## 2023-12-12 DIAGNOSIS — M5459 Other low back pain: Secondary | ICD-10-CM | POA: Diagnosis not present

## 2023-12-12 DIAGNOSIS — M62838 Other muscle spasm: Secondary | ICD-10-CM | POA: Diagnosis not present

## 2023-12-12 DIAGNOSIS — C673 Malignant neoplasm of anterior wall of bladder: Secondary | ICD-10-CM | POA: Diagnosis not present

## 2023-12-12 DIAGNOSIS — M47816 Spondylosis without myelopathy or radiculopathy, lumbar region: Secondary | ICD-10-CM | POA: Diagnosis not present
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
## 2023-12-13 DIAGNOSIS — J301 Allergic rhinitis due to pollen: Secondary | ICD-10-CM | POA: Diagnosis not present

## 2023-12-14 DIAGNOSIS — H5213 Myopia, bilateral: Secondary | ICD-10-CM | POA: Diagnosis not present

## 2023-12-19 DIAGNOSIS — C673 Malignant neoplasm of anterior wall of bladder: Secondary | ICD-10-CM | POA: Diagnosis not present

## 2023-12-20 DIAGNOSIS — G8929 Other chronic pain: Secondary | ICD-10-CM | POA: Diagnosis not present

## 2023-12-20 DIAGNOSIS — M25561 Pain in right knee: Secondary | ICD-10-CM | POA: Diagnosis not present

## 2023-12-20 DIAGNOSIS — J301 Allergic rhinitis due to pollen: Secondary | ICD-10-CM | POA: Diagnosis not present

## 2023-12-21 ENCOUNTER — Encounter: Payer: Self-pay | Admitting: Cardiology

## 2023-12-21 ENCOUNTER — Ambulatory Visit: Attending: Cardiology | Admitting: Cardiology

## 2023-12-21 ENCOUNTER — Ambulatory Visit

## 2023-12-21 VITALS — BP 130/68 | HR 80 | Ht 65.6 in | Wt 174.6 lb

## 2023-12-21 DIAGNOSIS — I350 Nonrheumatic aortic (valve) stenosis: Secondary | ICD-10-CM | POA: Diagnosis not present

## 2023-12-21 DIAGNOSIS — E782 Mixed hyperlipidemia: Secondary | ICD-10-CM

## 2023-12-21 DIAGNOSIS — J431 Panlobular emphysema: Secondary | ICD-10-CM | POA: Diagnosis not present

## 2023-12-21 DIAGNOSIS — I2510 Atherosclerotic heart disease of native coronary artery without angina pectoris: Secondary | ICD-10-CM | POA: Diagnosis not present

## 2023-12-21 DIAGNOSIS — G4733 Obstructive sleep apnea (adult) (pediatric): Secondary | ICD-10-CM

## 2023-12-21 DIAGNOSIS — I10 Essential (primary) hypertension: Secondary | ICD-10-CM | POA: Diagnosis not present

## 2023-12-21 NOTE — Patient Instructions (Signed)

## 2023-12-21 NOTE — Progress Notes (Signed)
 Cardiology Office Note:    Date:  12/21/2023   ID:  Deborah Hutchinson, DOB 12-26-1945, MRN [id number]  PCP:  Gable Cambric, MD  Cardiologist:  Jennifer JONELLE Crape, MD   Referring MD: Gable Cambric, MD    ASSESSMENT:    1. Coronary artery disease involving native coronary artery of native heart without angina pectoris   2. Essential hypertension   3. Panlobular emphysema (HCC)   4. OSA (obstructive sleep apnea)   5. Mixed dyslipidemia   6. Mild aortic stenosis    PLAN:    In order of problems listed above:  Coronary artery disease: Secondary prevention stressed with the patient.  Importance of compliance with diet medication stressed and she vocalized understanding.  She was advised to ambulate to the best of her ability. Essential hypertension: Blood pressure is stable and diet was emphasized.  Lifestyle modification urged. Mixed dyslipidemia: On lipid-lowering medications and lipids are at goal Diabetes mellitus: Elevated hemoglobin A1c and I cautioned her about this.  Diet emphasized.  Lifestyle modification urged. Mild aortic stenosis: Stable and asymptomatic.  Medical management.  Symptoms educated. 6 months follow-up or earlier if she has any concerns.   Medication Adjustments/Labs and Tests Ordered: Current medicines are reviewed at length with the patient today.  Concerns regarding medicines are outlined above.  No orders of the defined types were placed in this encounter.  No orders of the defined types were placed in this encounter.    No chief complaint on file.    History of Present Illness:    Deborah Hutchinson is a 78 y.o. female.  Patient has past medical history of coronary artery disease, essential hypertension, mixed dyslipidemia and diabetes mellitus.  She has significant orthopedic issues which is why she leads a sedentary lifestyle.  She ambulates with a walker.  She has mild aortic stenosis which is asymptomatic.  At the time of my evaluation, the  patient is alert awake oriented and in no distress.  Past Medical History:  Diagnosis Date   Aftercare following surgery 03/26/2020   Allergy 03/29/2021   Angina pectoris (HCC) 06/17/2020   Anxiety    Arthritis    all over   Asthma    Follows w/ Dr. Marina, pulmonologist.   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   Chronic kidney disease    Chronic obstructive pulmonary disease (HCC) 02/09/2017   Formatting of this note might be different from the original. Last Assessment & Plan:  I will review her PFTs after I obtain records from her pulmonologist. Based on this we will decide if she needs inhalers, subjectively they do not seem to have helped her at all. She may benefit from a pulmonary rehabilitation program Formatting of this note might be different from the original. Last Assessment    COPD (chronic obstructive pulmonary disease) (HCC) 02/09/2017   Coronary artery disease involving native coronary artery of native heart without angina pectoris 06/10/2015   Depression    Diabetes mellitus due to underlying condition with unspecified complications (HCC) 06/10/2015   Dyslipidemia 06/10/2015   Esophageal dysphagia 09/02/2019   Formatting of this note might be different from the original.  Added automatically from request for surgery [id number]  Essential hypertension 06/10/2015   Gastroparesis 09/04/2019   GERD (gastroesophageal reflux disease)    H/O hiatal hernia    Headache 09/24/2018   Heart murmur    Heme positive stool 10/06/2022   Hyperlipemia    Hypertension    Iron deficiency anemia 09/25/2020   Lumbar radicular pain 08/08/2022   Lumbar radiculopathy 12/18/2020   Mild aortic stenosis 05/16/2019   Mixed  dyslipidemia 06/10/2015   Moderate aortic stenosis 05/16/2019   Nausea and vomiting 06/16/2020   occasional   Neurogenic claudication 12/18/2020   OSA (obstructive sleep apnea) 02/09/2017   Pain and swelling of toe of right foot 07/15/2016   Palpitations 06/02/2016   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 Mild pancolonic diverticulosis. Highly redundant colon.    DILATION AND CURETTAGE OF UTERUS     one   ENDOSCOPIC MUCOSAL RESECTION N/A 03/22/2021   Procedure: ENDOSCOPIC MUCOSAL RESECTION;  Surgeon: Wilhelmenia Aloha Raddle., MD;  Location: [facility];  Service: Gastroenterology;  Laterality: N/A;   ENTEROSCOPY N/A 10/06/2022   Procedure: ENTEROSCOPY;  Surgeon: Charlanne Groom, MD;  Location: [facility];  Service: Gastroenterology;  Laterality: N/A;   ESOPHAGOGASTRODUODENOSCOPY  08/17/2015   Moderate hiatal hernia. Otherwise noraml EGD.    ESOPHAGOGASTRODUODENOSCOPY  09/03/2019   [facility]   ESOPHAGOGASTRODUODENOSCOPY (EGD) WITH PROPOFOL  N/A 03/22/2021   Procedure: ESOPHAGOGASTRODUODENOSCOPY (EGD) WITH PROPOFOL ;  Surgeon: Wilhelmenia Aloha Raddle., MD;  Location: [facility];  Service: Gastroenterology;  Laterality: N/A;   EUS  05/19/2011   Procedure: UPPER ENDOSCOPIC ULTRASOUND (EUS) LINEAR;  Surgeon: Toribio Cedar, MD;  Location: [facility];  Service: Endoscopy;  Laterality: N/A;  radial linear    GIVENS CAPSULE STUDY N/A 10/06/2022   Procedure: GIVENS CAPSULE STUDY;  Surgeon: Charlanne Groom, MD;  Location: [facility];  Service: Gastroenterology;  Laterality: N/A;   HEMOSTASIS CLIP PLACEMENT  03/22/2021   Procedure: HEMOSTASIS CLIP PLACEMENT;  Surgeon: Wilhelmenia Aloha Raddle., MD;  Location: [facility];  Service: Gastroenterology;;   HERNIA REPAIR  05/09/2020   HOT HEMOSTASIS N/A 10/06/2022   Procedure: HOT HEMOSTASIS (ARGON PLASMA COAGULATION/BICAP);  Surgeon: Charlanne Groom, MD;  Location: [facility];  Service: Gastroenterology;  Laterality: N/A;   HYSTEROTOMY     KNEE SURGERY     LAPAROSCOPIC PARAESOPHAGEAL HERNIA REPAIR  12/07/2019   LEFT HEART CATH AND CORONARY ANGIOGRAPHY     LEFT HEART CATH AND CORONARY ANGIOGRAPHY N/A 05/08/2019   Procedure: LEFT HEART CATH AND CORONARY ANGIOGRAPHY;  Surgeon: Verlin Lonni BIRCH, MD;  Location: [facility];  Service: Cardiovascular;  Laterality: N/A;   right thumb     tendon repair   RIGHT/LEFT HEART CATH AND CORONARY ANGIOGRAPHY N/A 06/23/2020   Procedure: RIGHT/LEFT HEART CATH AND CORONARY ANGIOGRAPHY;  Surgeon: [place], Peter M, MD;  Location: [facility];  Service: Cardiovascular;  Laterality: N/A;   SHOULDER SURGERY     rotator cuff repair   SUBMUCOSAL LIFTING INJECTION  03/22/2021   Procedure: SUBMUCOSAL LIFTING INJECTION;  Surgeon: Wilhelmenia,  Aloha Raddle., MD;  Location: [facility];  Service: Gastroenterology;;   TRANSFORAMINAL LUMBAR INTERBODY FUSION L4-5   02/27/2020   WRIST SURGERY Right     Current Medications: Current Meds  Medication Sig   albuterol  (PROVENTIL  HFA;VENTOLIN  HFA) 108 (90 Base) MCG/ACT inhaler Inhale 2 puffs into the lungs every 6 (six) hours as needed for wheezing or shortness of breath.   albuterol  (PROVENTIL ) (2.5 MG/3ML) 0.083% nebulizer solution Take 2.5 mg by nebulization as needed for shortness of breath or wheezing.   amLODipine  (NORVASC ) 10 MG tablet Take 10 mg by mouth daily.   aspirin  EC 81 MG tablet Take 81 mg by mouth daily. Swallow whole.   benzonatate (TESSALON) 200 MG capsule Take 200  mg by mouth at bedtime as needed for cough.   buPROPion  (WELLBUTRIN  XL) 300 MG 24 hr tablet Take 300 mg by mouth in the morning.   CALCIUM  PO Take 1 tablet by mouth daily.   Cholecalciferol (VITAMIN D3) 1.25 MG (50000 UT) CAPS Take 50,000 Units by mouth every Wednesday.   donepezil (ARICEPT) 10 MG tablet Take 10 mg by mouth daily at 12 noon.   EPINEPHrine  0.3 mg/0.3 mL IJ SOAJ injection Inject 0.3 mg into the muscle as needed for anaphylaxis.   escitalopram  (LEXAPRO ) 20 MG tablet Take 20 mg by mouth at bedtime.   famotidine  (PEPCID ) 40 MG tablet Take 1 tablet (40 mg total) by mouth 2 (two) times daily.   Ferrous Sulfate (IRON PO) Take 1 tablet by mouth in the morning.   fluticasone  (FLONASE ) 50 MCG/ACT nasal spray Place 2 sprays into both nostrils daily.   Fluticasone -Umeclidin-Vilant (TRELEGY ELLIPTA) 200-62.5-25 MCG/INH AEPB Inhale 2 puffs into the lungs daily.   hydrocortisone  (ANUCORT-HC ) 25 MG suppository INSERT RECTALLY AT BEDTIME FOR 5 NIGHTS   hydrocortisone -pramoxine (PROCTOFOAM-HC) rectal foam Place 1 applicator rectally 2 (two) times daily as needed for hemorrhoids or anal itching.   isosorbide  mononitrate (IMDUR ) 120 MG 24 hr tablet Take 1 tablet by mouth once daily   JARDIANCE 10 MG TABS tablet Take 10 mg by mouth daily.   levocetirizine (XYZAL ) 5 MG tablet TAKE 1 TABLET BY MOUTH ONCE DAILY IN THE EVENING   linaclotide  (LINZESS ) 72 MCG capsule Take 72 mcg by mouth as needed (constipation).   montelukast  (SINGULAIR ) 10 MG tablet Take 10 mg by mouth at bedtime.    Multiple Vitamin (MULTIVITAMIN) capsule Take 1 capsule by mouth in the morning.  Women's Multivitamin Gummy   nitroGLYCERIN  (NITROSTAT ) 0.4 MG SL tablet DISSOLVE 1 TABLET UNDER THE TONGUE AS NEEDED FOR CHEST PAIN EVERY 5 MINUTES UP TO 3 TIMES. IF NO RELIEF CALL 911. *NEW PRESCRIPTION REQUEST*   nystatin cream (MYCOSTATIN) Apply 1 Application topically 2 (two) times daily as needed (skin irritation/rash).   oxybutynin   (DITROPAN ) 5 MG tablet Take 1 tablet (5 mg total) by mouth every 8 (eight) hours as needed for bladder spasms.   OZEMPIC, 1 MG/DOSE, 4 MG/3ML SOPN Inject 1 mg into the skin once a week.   pantoprazole  (PROTONIX ) 40 MG tablet Take 1 tablet (40 mg total) by mouth daily.   rosuvastatin  (CRESTOR ) 20 MG tablet Take 1 tablet (20 mg total) by mouth daily.   traZODone (DESYREL) 50 MG tablet Take 50 mg by mouth at bedtime.   vitamin B-12 (CYANOCOBALAMIN) 500 MCG tablet Take 500 mcg by mouth in the morning.   [DISCONTINUED] canagliflozin (INVOKANA) 300 MG TABS tablet Take 300 mg by mouth in the morning.     Allergies:  Penicillins, Ampicillin, and Gabapentin   Social History   Socioeconomic History   Marital status: Single    Spouse name: Not on file   Number of children: 3   Years of education: Not on file   Highest education level: Not on file  Occupational History   Occupation: retired  Tobacco Use   Smoking status: Former    Current packs/day: 0.00    Types: Cigarettes    Quit date: 05/17/2011    Years since quitting: 12.6   Smokeless tobacco: Never  Vaping Use   Vaping status: Never Used  Substance and Sexual Activity   Alcohol use: No   Drug use: No   Sexual activity: Not on file  Other Topics Concern   Not on file  Social History Narrative   Right handed   Social Drivers of Health   Financial Resource Strain: Not on file  Food Insecurity: Low Risk  (08/02/2023)   Received from [facility]   Hunger Vital Sign    Within the past 12 months, you worried that your food would run out before you got money to buy more: Never true    Within the past 12 months, the food you bought just didn't last and you didn't have money to get more. : Never true  Transportation Needs: No Transportation Needs (08/02/2023)   Received from [facility]    In the past 12 months, has lack of reliable transportation kept you from medical appointments, meetings, work or from getting  things needed for daily living? : No  Physical Activity: Not on file  Stress: Not on file  Social Connections: Not on file     Family History: The patient's family history includes Cancer in her sister; Diabetes in her sister; Hypertension in her daughter. There is no history of Colon cancer, Liver disease, Pancreatic cancer, or Esophageal cancer.  ROS:   Please see the history of present illness.    All other systems reviewed and are negative.  EKGs/Labs/Other Studies Reviewed:    The following studies were reviewed today: .SABRA   I discussed my findings with the patient at length   Recent Labs: 08/29/2023: ALT 22; BUN 14; Creatinine 1.03; Hemoglobin 12.1; Platelet Count 366; Potassium 3.3; Sodium 142  Recent Lipid Panel    Component Value Date/Time   CHOL 153 12/23/2022 0923   TRIG 61 12/23/2022 0923   HDL 87 12/23/2022 0923   CHOLHDL 1.8 12/23/2022 0923   LDLCALC 54 12/23/2022 0923    Physical Exam:    VS:  BP 130/68   Pulse 80   Ht 5' 5.6 (1.666 m)   Wt 174 lb 9.6 oz (79.2 kg)   SpO2 96%   BMI 28.53 kg/m     Wt Readings from Last 3 Encounters:  12/21/23 174 lb 9.6 oz (79.2 kg)  09/20/23 182 lb (82.6 kg)  08/29/23 186 lb (84.4 kg)     GEN: Patient is in no acute distress HEENT: Normal NECK: No JVD; No carotid bruits LYMPHATICS: No lymphadenopathy CARDIAC: Hear sounds regular, 2/6 systolic murmur at the apex. RESPIRATORY:  Clear to auscultation without rales, wheezing or rhonchi  ABDOMEN: Soft, non-tender, non-distended MUSCULOSKELETAL:  No edema; No deformity  SKIN: Warm and dry NEUROLOGIC:  Alert and oriented x 3 PSYCHIATRIC:  Normal affect   Signed, Jennifer JONELLE Crape, MD  12/21/2023 3:26 PM     [facility]

## 2023-12-22 DIAGNOSIS — M1711 Unilateral primary osteoarthritis, right knee: Secondary | ICD-10-CM | POA: Diagnosis not present

## 2023-12-24 DIAGNOSIS — M1711 Unilateral primary osteoarthritis, right knee: Secondary | ICD-10-CM | POA: Diagnosis not present

## 2023-12-26 DIAGNOSIS — G4733 Obstructive sleep apnea (adult) (pediatric): Secondary | ICD-10-CM | POA: Diagnosis not present

## 2023-12-27 DIAGNOSIS — J301 Allergic rhinitis due to pollen: Secondary | ICD-10-CM | POA: Diagnosis not present

## 2023-12-27 DIAGNOSIS — M25561 Pain in right knee: Secondary | ICD-10-CM | POA: Diagnosis not present

## 2023-12-27 DIAGNOSIS — G8929 Other chronic pain: Secondary | ICD-10-CM | POA: Diagnosis not present

## 2023-12-28 ENCOUNTER — Telehealth: Payer: Self-pay | Admitting: Gastroenterology

## 2023-12-28 ENCOUNTER — Ambulatory Visit

## 2023-12-28 DIAGNOSIS — M6281 Muscle weakness (generalized): Secondary | ICD-10-CM | POA: Diagnosis not present

## 2023-12-28 DIAGNOSIS — M62838 Other muscle spasm: Secondary | ICD-10-CM | POA: Diagnosis not present

## 2023-12-28 DIAGNOSIS — R279 Unspecified lack of coordination: Secondary | ICD-10-CM | POA: Diagnosis not present

## 2023-12-28 DIAGNOSIS — M25551 Pain in right hip: Secondary | ICD-10-CM

## 2023-12-28 DIAGNOSIS — M5459 Other low back pain: Secondary | ICD-10-CM

## 2023-12-28 NOTE — Telephone Encounter (Signed)
 Spoke with patient & she reports increase in reflux/indegestion and nausea for the last few days. Worse in the later morning. Denies any pain. Only new medication she has started is robaxin two weeks ago. Normal bm's daily. She's currently taking 40 mg of pantoprazole  QAM & pepcid  20 mg BID. She was last seen with Dr. Charlanne 09/20/23 for OV. She's requesting further recommendations. Advised her in the meantime to try OTC FDgard or maalox. Also, try eating smaller, more frequent meals given her history with gastroparesis. Pt verbalized all understanding.

## 2023-12-28 NOTE — Telephone Encounter (Signed)
 Please call in GI cocktail 15 to 30 cc p.o. every 8 hours as needed #240 ml, 2RF Equal amounts of Maalox, viscous lidocaine  and Bentyl  RG

## 2023-12-28 NOTE — Telephone Encounter (Signed)
 Inbound call from patient stating she is having a lot of GERD throughout the day. Also states he stomach has been feeling nauseated all the time. Patient is requesting a call to discuss further. Please advise, thank you.

## 2023-12-28 NOTE — Telephone Encounter (Signed)
 Left message for patient to call back

## 2023-12-28 NOTE — Progress Notes (Unsigned)
 [facility]  [address] [place],  [place]  [address] [phone]  Clinic Day:  08/29/2023  Referring physician: Gable Cambric, MD   HISTORY OF PRESENT ILLNESS:  The patient is a 78 y.o. female with a history of recurrent iron deficiency anemia.  Furthermore, a component of her anemia has been related to chronic renal insufficiency.  She last received IV iron in June 2024.  She comes in today for routine follow-up.  Since her last visit, the patient has been doing fairly well.  She denies having increased fatigue or any overt forms of blood loss to explain her iron deficiency anemia.  With respect to her newly diagnosed bladder cancer, the patient just received her 6th and final BCG treatment earlier today.   VITALS:  There were no vitals taken for this visit.  Wt Readings from Last 3 Encounters:  12/21/23 174 lb 9.6 oz (79.2 kg)  09/20/23 182 lb (82.6 kg)  08/29/23 186 lb (84.4 kg)    There is no height or weight on file to calculate BMI.  Performance status (ECOG): 1  PHYSICAL EXAM:  Physical Exam Constitutional:      General: She is not in acute distress.    Appearance: Normal appearance. She is normal weight.  HENT:     Head: Normocephalic and atraumatic.  Eyes:     General: No scleral icterus.    Extraocular Movements: Extraocular movements intact.     Conjunctiva/sclera: Conjunctivae normal.     Pupils: Pupils are equal, round, and reactive to light.  Cardiovascular:     Rate and Rhythm: Normal rate and regular rhythm.     Pulses: Normal pulses.     Heart sounds: Normal heart sounds. No murmur heard.    No friction rub. No gallop.  Pulmonary:     Effort: Pulmonary effort is normal. No respiratory distress.     Breath sounds: Normal breath sounds.  Abdominal:     General: Bowel sounds are normal. There is no distension.     Palpations: Abdomen is soft. There is no hepatomegaly, splenomegaly or mass.     Tenderness: There is no  abdominal tenderness.  Musculoskeletal:        General: Normal range of motion.     Cervical back: Normal range of motion and neck supple.     Right lower leg: No edema.     Left lower leg: No edema.  Lymphadenopathy:     Cervical: No cervical adenopathy.  Skin:    General: Skin is warm and dry.  Neurological:     General: No focal deficit present.     Mental Status: She is alert and oriented to person, place, and time. Mental status is at baseline.  Psychiatric:        Mood and Affect: Mood normal.        Behavior: Behavior normal.        Thought Content: Thought content normal.        Judgment: Judgment normal.     LABS:    Latest Reference Range & Units 08/29/23 13:21  WBC 4.0 - 10.5 K/uL 8.8  RBC 3.87 - 5.11 MIL/uL 4.21  Hemoglobin 12.0 - 15.0 g/dL 87.8  HCT 63.9 - 53.9 % 39.4  MCV 80.0 - 100.0 fL 93.6  MCH 26.0 - 34.0 pg 28.7  MCHC 30.0 - 36.0 g/dL 69.2  RDW 88.4 - 84.4 % 15.1  Platelets 150 - 400 K/uL 366  nRBC 0.0 - 0.2 %  0 /100 WBC 0.00 0  Neutrophils % 72  Lymphocytes % 13  Monocytes Relative % 8  Eosinophil % 7  Basophil % 0  Immature Granulocytes % 0    Latest Reference Range & Units 08/29/23 13:21  Iron 28 - 170 ug/dL 64  UIBC ug/dL 658  TIBC 749 - 549 ug/dL 594  Saturation Ratios 10.4 - 31.8 % 16  Ferritin 11 - 307 ng/mL 20    Latest Reference Range & Units 08/29/23 13:21  Sodium 135 - 145 mmol/L 142  Potassium 3.5 - 5.1 mmol/L 3.3 (L)  Chloride 98 - 111 mmol/L 106  CO2 22 - 32 mmol/L 24  Glucose 70 - 99 mg/dL 861 (H)  BUN 8 - 23 mg/dL 14  Creatinine 9.55 - 8.99 mg/dL 8.96 (H)  Calcium  8.9 - 10.3 mg/dL 9.3  Anion gap 5 - 15  12  Alkaline Phosphatase 38 - 126 U/L 125  Albumin 3.5 - 5.0 g/dL 4.1  AST 15 - 41 U/L 23  ALT 0 - 44 U/L 22  Total Protein 6.5 - 8.1 g/dL 6.5  Total Bilirubin 0.0 - 1.2 mg/dL <9.7  GFR, Est Non African American >60 mL/min 55 (L)  (L): Data is abnormally low (H): Data is abnormally high  ASSESSMENT & PLAN:   Assessment/Plan:  A 78 year old woman with recurrent iron deficiency anemia.  Her labs today show that her iron and hemoglobin levels are slightly lower than previously, but remain at normal levels.  Clinically, she appears to be doing okay.  For now, her counts will continue to be followed conservatively.  I will see her back in 4 months for repeat clinical assessment.  The patient understands all the plans discussed today and is in agreement with them.   Leauna Sharber DELENA Kerns, MD
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
## 2023-12-29 ENCOUNTER — Inpatient Hospital Stay: Attending: Oncology

## 2023-12-29 ENCOUNTER — Other Ambulatory Visit: Payer: Self-pay

## 2023-12-29 ENCOUNTER — Other Ambulatory Visit: Payer: Self-pay | Admitting: Oncology

## 2023-12-29 ENCOUNTER — Telehealth: Payer: Self-pay | Admitting: Oncology

## 2023-12-29 ENCOUNTER — Inpatient Hospital Stay (HOSPITAL_BASED_OUTPATIENT_CLINIC_OR_DEPARTMENT_OTHER): Admitting: Oncology

## 2023-12-29 VITALS — BP 152/88 | HR 76 | Temp 98.1°F | Resp 14 | Ht 64.0 in | Wt 176.1 lb

## 2023-12-29 DIAGNOSIS — D500 Iron deficiency anemia secondary to blood loss (chronic): Secondary | ICD-10-CM | POA: Diagnosis not present

## 2023-12-29 DIAGNOSIS — D508 Other iron deficiency anemias: Secondary | ICD-10-CM

## 2023-12-29 DIAGNOSIS — N189 Chronic kidney disease, unspecified: Secondary | ICD-10-CM | POA: Insufficient documentation

## 2023-12-29 DIAGNOSIS — D509 Iron deficiency anemia, unspecified: Secondary | ICD-10-CM | POA: Diagnosis not present

## 2023-12-29 LAB — CMP (CANCER CENTER ONLY)
ALT: 58 U/L — ABNORMAL HIGH (ref 0–44)
AST: 37 U/L (ref 15–41)
Albumin: 4.2 g/dL (ref 3.5–5.0)
Alkaline Phosphatase: 110 U/L (ref 38–126)
Anion gap: 14 (ref 5–15)
BUN: 19 mg/dL (ref 8–23)
CO2: 21 mmol/L — ABNORMAL LOW (ref 22–32)
Calcium: 9.1 mg/dL (ref 8.9–10.3)
Chloride: 106 mmol/L (ref 98–111)
Creatinine: 1.05 mg/dL — ABNORMAL HIGH (ref 0.44–1.00)
GFR, Estimated: 53 mL/min — ABNORMAL LOW (ref >60)
Glucose, Bld: 235 mg/dL — ABNORMAL HIGH (ref 70–99)
Potassium: 3.4 mmol/L — ABNORMAL LOW (ref 3.5–5.1)
Sodium: 141 mmol/L (ref 135–145)
Total Bilirubin: 0.2 mg/dL (ref 0.0–1.2)
Total Protein: 6.6 g/dL (ref 6.5–8.1)

## 2023-12-29 LAB — CBC WITH DIFFERENTIAL (CANCER CENTER ONLY)
Abs Immature Granulocytes: 0.08 K/uL — ABNORMAL HIGH (ref 0.00–0.07)
Basophils Absolute: 0 K/uL (ref 0.0–0.1)
Basophils Relative: 0 %
Eosinophils Absolute: 0 K/uL (ref 0.0–0.5)
Eosinophils Relative: 0 %
HCT: 39.6 % (ref 36.0–46.0)
Hemoglobin: 12.6 g/dL (ref 12.0–15.0)
Immature Granulocytes: 1 %
Lymphocytes Relative: 6 %
Lymphs Abs: 0.7 K/uL (ref 0.7–4.0)
MCH: 28.1 pg (ref 26.0–34.0)
MCHC: 31.8 g/dL (ref 30.0–36.0)
MCV: 88.2 fL (ref 80.0–100.0)
Monocytes Absolute: 0.7 K/uL (ref 0.1–1.0)
Monocytes Relative: 6 %
Neutro Abs: 10.5 K/uL — ABNORMAL HIGH (ref 1.7–7.7)
Neutrophils Relative %: 87 %
Platelet Count: 349 K/uL (ref 150–400)
RBC: 4.49 MIL/uL (ref 3.87–5.11)
RDW: 15.9 % — ABNORMAL HIGH (ref 11.5–15.5)
WBC Count: 12.1 K/uL — ABNORMAL HIGH (ref 4.0–10.5)
nRBC: 0 % (ref 0.0–0.2)

## 2023-12-29 LAB — IRON AND TIBC
Iron: 51 ug/dL (ref 28–170)
Saturation Ratios: 12 % (ref 10.4–31.8)
TIBC: 437 ug/dL (ref 250–450)
UIBC: 386 ug/dL

## 2023-12-29 LAB — FERRITIN: Ferritin: 13 ng/mL (ref 11–307)

## 2023-12-29 MED ORDER — LIDOCAINE VISCOUS HCL 2 % MT SOLN: OROMUCOSAL | 2 refills | Status: DC

## 2023-12-29 NOTE — Telephone Encounter (Signed)
 Discussed MD recommendations with patient & prescription order faxed to [facility] [phone].

## 2023-12-29 NOTE — Telephone Encounter (Signed)
 Patient has been scheduled for follow-up visit per 12/29/23 LOS.  Pt aware of scheduled appt details.

## 2024-01-01 ENCOUNTER — Ambulatory Visit: Admitting: Physical Therapy

## 2024-01-01 ENCOUNTER — Encounter: Payer: Self-pay | Admitting: Physical Therapy

## 2024-01-01 DIAGNOSIS — M62838 Other muscle spasm: Secondary | ICD-10-CM | POA: Diagnosis not present

## 2024-01-01 DIAGNOSIS — M25551 Pain in right hip: Secondary | ICD-10-CM

## 2024-01-01 DIAGNOSIS — M5459 Other low back pain: Secondary | ICD-10-CM | POA: Diagnosis not present

## 2024-01-01 DIAGNOSIS — R279 Unspecified lack of coordination: Secondary | ICD-10-CM | POA: Diagnosis not present

## 2024-01-01 DIAGNOSIS — M6281 Muscle weakness (generalized): Secondary | ICD-10-CM

## 2024-01-01 DIAGNOSIS — R059 Cough, unspecified: Secondary | ICD-10-CM | POA: Diagnosis not present
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
## 2024-01-02 DIAGNOSIS — J301 Allergic rhinitis due to pollen: Secondary | ICD-10-CM | POA: Diagnosis not present

## 2024-01-04 ENCOUNTER — Ambulatory Visit

## 2024-01-04 DIAGNOSIS — M6281 Muscle weakness (generalized): Secondary | ICD-10-CM

## 2024-01-04 DIAGNOSIS — R279 Unspecified lack of coordination: Secondary | ICD-10-CM | POA: Diagnosis not present

## 2024-01-04 DIAGNOSIS — M25551 Pain in right hip: Secondary | ICD-10-CM | POA: Diagnosis not present

## 2024-01-04 DIAGNOSIS — M5459 Other low back pain: Secondary | ICD-10-CM | POA: Diagnosis not present

## 2024-01-04 DIAGNOSIS — M62838 Other muscle spasm: Secondary | ICD-10-CM | POA: Diagnosis not present
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
## 2024-01-07 DIAGNOSIS — K5909 Other constipation: Secondary | ICD-10-CM | POA: Diagnosis not present

## 2024-01-07 DIAGNOSIS — I10 Essential (primary) hypertension: Secondary | ICD-10-CM | POA: Diagnosis not present

## 2024-01-07 DIAGNOSIS — E1140 Type 2 diabetes mellitus with diabetic neuropathy, unspecified: Secondary | ICD-10-CM | POA: Diagnosis not present

## 2024-01-07 DIAGNOSIS — E785 Hyperlipidemia, unspecified: Secondary | ICD-10-CM | POA: Diagnosis not present

## 2024-01-09 ENCOUNTER — Telehealth: Payer: Self-pay | Admitting: Gastroenterology

## 2024-01-09 ENCOUNTER — Ambulatory Visit: Attending: Nurse Practitioner

## 2024-01-09 DIAGNOSIS — R279 Unspecified lack of coordination: Secondary | ICD-10-CM | POA: Diagnosis not present

## 2024-01-09 DIAGNOSIS — M5459 Other low back pain: Secondary | ICD-10-CM | POA: Diagnosis not present

## 2024-01-09 DIAGNOSIS — M25551 Pain in right hip: Secondary | ICD-10-CM | POA: Insufficient documentation

## 2024-01-09 DIAGNOSIS — J301 Allergic rhinitis due to pollen: Secondary | ICD-10-CM | POA: Diagnosis not present

## 2024-01-09 DIAGNOSIS — M6281 Muscle weakness (generalized): Secondary | ICD-10-CM | POA: Diagnosis not present

## 2024-01-09 DIAGNOSIS — M62838 Other muscle spasm: Secondary | ICD-10-CM | POA: Insufficient documentation

## 2024-01-09 NOTE — Patient Instructions (Signed)
 Bowel massage: To assist with more regular and more comfortable bowel movements, try performing bowel massage nightly for 5-10 minutes. Place hands in the lower right side of your abdomen to start; in small circles, massage up, across, and down the left side of your abdomen. Pressure does not need to be hard, but just comfortable. You can use lotion or oil to make more comfortable.    The knack: Use this technique while coughing, laughing, sneezing, or with any activities that causes you to leak urine a little. Right before you perform one of these activities that increase pressure in the abdomen and pushes a little urine out, perform a pelvic floor muscle contraction and hold. If that does not completely stop the leaking, try tightening your thighs together in addition to performing a pelvic floor muscle contraction. Make sure you are not trying to stifle a cough, sneeze, or laugh; allow these activities in full as it will cause less pressure down into the bladder and pelvic floor muscles.    Josette Mares, PT, DPT09/02/259:22 AM
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
## 2024-01-09 NOTE — Telephone Encounter (Signed)
 Inbound call from pt requesting to speak to the nurse in regards to Acid reflux. Patient stated that her GERD is really bad at night. Patient is requesting a call back. Please advise.

## 2024-01-10 NOTE — Telephone Encounter (Signed)
 Pt stated that she is having ongoing reflux and especially at night.  Pt currently taking Protonix  40 mg daily and Pepcid  40 mg tablet twice daily.  Pt recently prescribed GI cocktail. Pt stated that she took all of that and it did not help.  Pt was scheduled to see Dr. Charlanne on 01/17/2024 at 8:50 AM. Pt made aware. Pt verbalized understanding with all questions answered.

## 2024-01-11 ENCOUNTER — Ambulatory Visit

## 2024-01-11 DIAGNOSIS — J301 Allergic rhinitis due to pollen: Secondary | ICD-10-CM | POA: Diagnosis not present

## 2024-01-11 DIAGNOSIS — J4540 Moderate persistent asthma, uncomplicated: Secondary | ICD-10-CM | POA: Diagnosis not present

## 2024-01-11 DIAGNOSIS — R053 Chronic cough: Secondary | ICD-10-CM | POA: Diagnosis not present

## 2024-01-11 DIAGNOSIS — R918 Other nonspecific abnormal finding of lung field: Secondary | ICD-10-CM | POA: Diagnosis not present

## 2024-01-11 DIAGNOSIS — G4733 Obstructive sleep apnea (adult) (pediatric): Secondary | ICD-10-CM | POA: Diagnosis not present

## 2024-01-15 DIAGNOSIS — G4733 Obstructive sleep apnea (adult) (pediatric): Secondary | ICD-10-CM | POA: Diagnosis not present

## 2024-01-16 ENCOUNTER — Ambulatory Visit

## 2024-01-16 DIAGNOSIS — M62838 Other muscle spasm: Secondary | ICD-10-CM

## 2024-01-16 DIAGNOSIS — R279 Unspecified lack of coordination: Secondary | ICD-10-CM | POA: Diagnosis not present

## 2024-01-16 DIAGNOSIS — M5459 Other low back pain: Secondary | ICD-10-CM | POA: Diagnosis not present

## 2024-01-16 DIAGNOSIS — M6281 Muscle weakness (generalized): Secondary | ICD-10-CM

## 2024-01-16 DIAGNOSIS — M47816 Spondylosis without myelopathy or radiculopathy, lumbar region: Secondary | ICD-10-CM | POA: Diagnosis not present

## 2024-01-16 DIAGNOSIS — M25551 Pain in right hip: Secondary | ICD-10-CM | POA: Diagnosis not present
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
## 2024-01-17 ENCOUNTER — Encounter: Payer: Self-pay | Admitting: Gastroenterology

## 2024-01-17 ENCOUNTER — Ambulatory Visit (INDEPENDENT_AMBULATORY_CARE_PROVIDER_SITE_OTHER): Admitting: Gastroenterology

## 2024-01-17 VITALS — BP 138/72 | HR 66 | Ht 65.0 in | Wt 173.2 lb

## 2024-01-17 DIAGNOSIS — R109 Unspecified abdominal pain: Secondary | ICD-10-CM

## 2024-01-17 DIAGNOSIS — K5900 Constipation, unspecified: Secondary | ICD-10-CM

## 2024-01-17 DIAGNOSIS — K449 Diaphragmatic hernia without obstruction or gangrene: Secondary | ICD-10-CM

## 2024-01-17 DIAGNOSIS — K219 Gastro-esophageal reflux disease without esophagitis: Secondary | ICD-10-CM

## 2024-01-17 DIAGNOSIS — G8929 Other chronic pain: Secondary | ICD-10-CM

## 2024-01-17 NOTE — Progress Notes (Signed)
 IMPRESSION and PLAN:    #1.  Abdo pain LUQ/left flank pain-appears to be more musculoskeletal. Now with RUQ pain. CT also with significant constipation.  #2. 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 S/P multiple iron infusoins.  Followed by hematology. (Resolved off plavix , now on bASA) Prev GI workup included push SBE 10/06/2022 showing 3 cm HH, duodenal AVMs s/p APC. Nl Px jejunum VCE 10/06/2022 was neg for any small bowel active bleeding. Neg colon 11/2020 for etiology of IDA Neg PET 06/2022.  Majority of the hyperplastic gastric polyps were removed on EGD 03/2021 with Dr. Wilhelmenia.  Off plavix  Most recent hemoglobin 12.6 as below.  #3. Gastroparesis. Reglan  stopped d/t tardive dyskinesia.  #4. GERD with large HH s/p repair Nov 2021, now with 3 cm residual/recurrent hernia on EGD.  #5. Peritoneal mesothelioma (low grade) at time of [facility] repair. Stable. Last CT chest/A/P March 2024 without progression. Being followed by Dr Claryce. Neg PET 06/2022    Plan: -Continue Protonix  40mg  po QAM -Pepcid  20 mg p.o. at bedtime -Continue linzess  72mcg po Qsunday and PRN. -Continue to follow-up with Dr. Ezzard (hematology) -If still with problems, then rpt GES -D/W pt and daughter. HPI:    Chief Complaint:   Deborah Hutchinson is a 78 y.o. female with CAD on plavix (off bASA), DM2, HH, OSA, COPD, LBP d/t OA, HTN, HLD.  Bladder CA s/p BCG (Dr Devere) Accompanied by her daughter  History of Present Illness  C/O persistent acid reflux symptoms.  Failed GI cocktail.  Better with baking soda as needed at night.  She experiences significant acid reflux throughout the day and night, which is alleviated by taking baking soda. The sensation starts in her stomach and rises up, causing a burning sensation in her throat, particularly at night, disrupting her sleep. She is currently taking Protonix  and famotidine  for her stomach issues. She previously tried Reglan  but experienced  adverse effects such as twitching, so it was discontinued.  She has a history of a large hiatal hernia that was repaired in 2021. Despite the repair, she continues to experience symptoms such as feeling full quickly and discomfort after eating. She avoids eating after 7 PM and ensures she walks for at least half an hour after meals to aid digestion.  She also deals with constipation, which has improved with therapy. She takes fiber supplements and Linzess  as needed, but avoids frequent use due to concerns about loose stools and soreness from hemorrhoids. She takes Linzess  on an as-needed basis, typically not more than once a week.  Her past medical history includes bladder cancer, for which she sees a urologist every three months. She also has a history of back issues, including L4, L5, and T10 problems, for which she recently received injections that have been helpful. No current constipation issues, and her bowel movements have improved with therapy. She experiences bloating when constipated.    Previous notes: S/P HH repair by Dr. Adelia November 2021. Was found to have low-grade mesothelioma.  She is closely being followed by Dr. Claryce.  No worsening. CT Abdo/pelvis March 2022, 07/2021 as below which did not show any progression.  Past GI procedures: CT AP with contrast 05/2023 1. Soft tissue anterior to the descending colon is in continuity with the musculature of the anterior abdominal wall, multiple areas similar to this appear bilaterally although this is the largest and most pronounced as well is more conspicuous than on prior examinations but nonspecific and of  uncertain clinical significance particularly given patient's complaint of left upper quadrant pain. 2. Small hiatal hernia with symmetric distal esophageal wall thickening and wall thickening versus underdistention of the gastric antrum. Correlate for esophagitis and consider further evaluation with endoscopy. 3. Right lower  lobe ground-glass opacities previously evaluated on PET-CT dated July 05, 2022 without significant uptake. Suggest continued CT chest surveillance. 4. Scattered colonic diverticulosis without findings of acute diverticulitis. 5. Fat containing right inguinal hernia. 6.  Aortic Atherosclerosis (ICD10-I70.0).  SBE 10/06/2022 showing 3 cm HH, duodenal AVMs s/p APC.  VCE 10/06/2022 was negative for any small bowel active bleeding.  CT C/A/P 3/192025 IMPRESSION:  1. No evidence of progressive neoplastic disease.  2.  Enhancement along the right posterior bladder wall is not apparent within the decompressed bladder today. If there is any clinical concern, CT urogram could further evaluate.    CT C/A/P wih contrast 07/26/2022 1.  There is a centimeter enhancing focus at the right bladder which raises concern for a primary urothelial neoplasm. Consider cystoscopy for further evaluation- AWAITING urology consulatation  2.  Distal sigmoid diverticula with adjacent stranding concerning for diverticulitis. No evidence of perforation or adjacent fluid collection.  3.  No convincing evidence of progressive peritoneal or diaphragmatic disease.  4.  Similar scattered groundglass opacities/nodules within the lungs and bilateral adrenal nodules.   PET 06/2022 IMPRESSION:  1. No tracer avid solid nodule or mass identified.  2. As noted previously there are multiple ground-glass nodules  identified within both lungs. These do not demonstrate any  appreciable tracer uptake. The largest ground-glass nodule is in the  periphery of the left upper lobe measuring 1.3 cm. Absent FDG uptake  does not exclude the possibility of underlying indolent neoplastic  process such as pulmonary adenocarcinoma and continued interval  surveillance of these nodules is strongly recommended.  3. Stable left adrenal adenoma.  4. Coronary artery calcifications.  5.  Aortic Atherosclerosis (ICD10-I70.0).   CT chest/AP  07/2021 1.  No significant change in appearance of thickening of the diaphragm status post paraesophageal hernia repair. No definite new peritoneal disease or diaphragmatic nodularity identified.  2.  Similar groundglass opacities/nodules throughout the lungs bilaterally.  3.  Similar bilateral adrenal nodules. Can consider MR imaging for further characterization as clinically indicated.  EGD 03/2021 (Dr Wilhelmenia) - No gross lesions in esophagus proximally. LA Grade A esophagitis with no bleeding distally (this is improved from prior). - Z-line irregular, 34 cm from the incisors. - 3 cm hiatal hernia. - Four gastric polyps. Resected and retrieved. Clips (MR conditional) were placed. - Gastritis. Biopsied. - No gross lesions in the duodenal bulb, in the first portion of the duodenum and in the second portion of the duodenum.   EGD 11/27/2020 - LA Grade C reflux esophagitis with no bleeding. Bx- neg - 3 cm hiatal hernia. S/P fundoplication. - 3 10-12 mm semi-sessile polyps with no bleeding and no stigmata of recent bleeding were found in the gastric body (1 at the diaphragmatic hiatus and the other 2 in proximal body of the stomach). Bx- Hyperplastic - Normal examined duodenum.  Colon 11/27/2020 - Seven 6 to 8 mm polyps in the mid descending colon, in the mid transverse colon, in the proximal ascending colon and in the mid ascending colon, removed with a cold snare. Resected and retrieved. Bx- TAs - Diverticulosis in the sigmoid colon and in the ascending colon.  EGD 08/2019: Candida esophagitis, no stricture, 7 cm hiatal hernia.  Treated empirically for Candida esophagitis  EGD 12/14/2018: Large HH, hyperplastic gastric polyps, eso stricture s/p dil 50 Fr. EGD 08/17/2015 mod HH, neg SB Bx for celiac.  UGI series 04/2019: HH, GERD, no strictures.  Ba tab passed without any problems.  Colonoscopy 12/14/2018-colonic polyps s/p polypectomy, mild pancolonic div. Bx- TA.  CT AP with contrast  02/09/2018: Neg, Stable left adrenal adenoma, small right inguinal hernia.  GES 08/2019: delayed gastric emptying (12% emptied at hr 1, 29% at hr 2, 47% at hr 3, 64% an hr 4).  CT AP 07/2020 Impression: 1. Mildly thickened appearance of the hemidiaphragms without a discrete measurable mass, overall similar relative to the prior CT dated 01/08/2020 in this patient with biopsy-proven peritoneal mesothelioma involving the right hemidiaphragm. No definite new peritoneal nodules are identified. 2. No ascites. 3. Similar groundglass opacities/nodules throughout the lungs bilaterally, most conspicuous in the right lower lobe. These findings may be infectious, inflammatory, or neoplastic in etiology. 4. 4 mm solid right lower lobe pulmonary nodule, unchanged. The previously identified solid nodule in the right upper lobe is no longer seen. 5. Indeterminate bilateral adrenal lesions, measuring up to 1.7 cm on the left. Consider recharacterization with MRI is recommended. 6. Multiple additional ancillary findings, as above.   ============================  PATHOLOGY: PATHOLOGY: Final Pathologic Diagnosis  A. Frozen peritoneal nodule:        Mesothelioma, epithelioid type, low tumor grade, trabecular pattern.       See comment.   B. Peritoneal nodule:       Mesothelioma, epithelioid type, low tumor grade, trabecular pattern.       See comment.     LHC 05/08/2019: Prox RCA lesion is 50% stenosed. Prox LAD lesion is 40% stenosed. The left ventricular systolic function is normal. LV end diastolic pressure is normal. The left ventricular ejection fraction is greater than 65% by visual estimate. There is no mitral valve regurgitation. Past Medical History:  Diagnosis Date   Aftercare following surgery 03/26/2020   Allergy 03/29/2021   Angina pectoris (HCC) 06/17/2020   Anxiety    Arthritis    all over   Asthma    Follows w/ Dr. Marina, pulmonologist.   Bronchitis    CAD (coronary artery  disease) 02/13/2019   Cancer (HCC)    mesothelioma of diaphragm, spot on lung also, probable bladder cancer   Chest pain 05/08/2019   Chest pain of uncertain etiology    Chronic bilateral low back pain without sciatica 07/11/2019   Pain goes down into thighs.   Chronic kidney disease    Chronic obstructive pulmonary disease (HCC) 02/09/2017   Formatting of this note might be different from the original. Last Assessment & Plan:  I will review her PFTs after I obtain records from her pulmonologist. Based on this we will decide if she needs inhalers, subjectively they do not seem to have helped her at all. She may benefit from a pulmonary rehabilitation program Formatting of this note might be different from the original. Last Assessment    COPD (chronic obstructive pulmonary disease) (HCC) 02/09/2017   Coronary artery disease involving native coronary artery of native heart without angina pectoris 06/10/2015   Depression    Diabetes mellitus due to underlying condition with unspecified complications (HCC) 06/10/2015   Dyslipidemia 06/10/2015   Esophageal dysphagia 09/02/2019   Formatting of this note might be different from the original.  Added automatically from request for surgery [id number]     Essential hypertension 06/10/2015   Gastroparesis 09/04/2019   GERD (gastroesophageal reflux disease)  H/O hiatal hernia    Headache 09/24/2018   Heart murmur    Heme positive stool 10/06/2022   Hyperlipemia    Hypertension    Iron deficiency anemia 09/25/2020   Lumbar radicular pain 08/08/2022   Lumbar radiculopathy 12/18/2020   Mild aortic stenosis 05/16/2019   Mixed dyslipidemia 06/10/2015   Moderate aortic stenosis 05/16/2019   Nausea and vomiting 06/16/2020   occasional   Neurogenic claudication 12/18/2020   OSA (obstructive sleep apnea) 02/09/2017   Pain and swelling of toe of right foot 07/15/2016   Palpitations 06/02/2016   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   Preop  cardiovascular exam 09/12/2022   S/P lumbar fusion 03/26/2020   Shortness of breath    on  excertion   Sleep apnea     Current Outpatient Medications  Medication Sig Dispense Refill   albuterol  (PROVENTIL  HFA;VENTOLIN  HFA) 108 (90 Base) MCG/ACT inhaler Inhale 2 puffs into the lungs every 6 (six) hours as needed for wheezing or shortness of breath. 3 Inhaler 3   amLODipine  (NORVASC ) 10 MG tablet Take 10 mg by mouth daily.     budesonide -formoterol  (SYMBICORT ) 160-4.5 MCG/ACT inhaler Inhale 2 puffs into the lungs 2 (two) times daily. 3 Inhaler 3   buPROPion  (WELLBUTRIN  XL) 300 MG 24 hr tablet Take 300 mg by mouth daily.      Cholecalciferol (VITAMIN D  PO) Take 5,000 Units by mouth as directed. Every 2 weeks     clopidogrel  (PLAVIX ) 75 MG tablet Take 1 tablet (75 mg total) by mouth daily. 30 tablet 11   dicyclomine (BENTYL) 20 MG tablet Take 20 mg by mouth every 6 (six) hours as needed.            escitalopram  (LEXAPRO ) 10 MG tablet Take 10 mg by mouth daily.     famotidine  (PEPCID ) 40 MG tablet Take 1 tablet (40 mg total) by mouth at bedtime. 180 tablet 3   fluticasone  (VERAMYST) 27.5 MCG/SPRAY nasal spray Place 2 sprays into the nose daily.     gabapentin (NEURONTIN) 300 MG capsule Take 300 mg by mouth 3 (three) times daily as needed.      hydrALAZINE  (APRESOLINE ) 50 MG tablet Take 1 tablet (50 mg total) by mouth 3 (three) times daily. 180 tablet 2   hydrochlorothiazide  (HYDRODIURIL ) 25 MG tablet Take 1 tablet (25 mg total) by mouth daily. 60 tablet 2   Lancets (ACCU-CHEK MULTICLIX) lancets      linaclotide  (LINZESS ) 72 MCG capsule Take 72 mcg by mouth as needed.     losartan  (COZAAR ) 100 MG tablet Take 1 tablet (100 mg total) by mouth daily. 60 tablet 2   metFORMIN (GLUCOPHAGE) 500 MG tablet Take 500 mg by mouth 2 (two) times daily with a meal.      montelukast  (SINGULAIR ) 10 MG tablet Take 10 mg by mouth at bedtime.      Multiple Vitamin (MULTIVITAMIN) capsule Take 1 capsule by mouth  daily.       oxybutynin  (DITROPAN -XL) 10 MG 24 hr tablet Take 10 mg by mouth daily.     pantoprazole  (PROTONIX ) 40 MG tablet Take 1 tablet (40 mg total) by mouth daily. 60 tablet 2   Potassium Chloride  CR (MICRO-K ) 8 MEQ CPCR capsule CR TAKE 1 CAPSULE BY MOUTH ONCE DAILY FOR LOW POTASSIUM     simvastatin  (ZOCOR ) 20 MG tablet Take 20 mg by mouth at bedtime.     spironolactone  (ALDACTONE ) 25 MG tablet Take 1 tablet (25 mg total) by mouth daily. 60 tablet  2   traMADol (ULTRAM) 50 MG tablet Take 50 mg by mouth 2 (two) times daily as needed for pain.     VICTOZA 18 MG/3ML SOPN 12 mg daily.      BD PEN NEEDLE NANO U/F 32G X 4 MM MISC      isosorbide  mononitrate (IMDUR ) 120 MG 24 hr tablet Take 1 tablet (120 mg total) by mouth daily. (Patient not taking: Reported on 06/10/2019) 60 tablet 2   nitroGLYCERIN  (NITROSTAT ) 0.4 MG SL tablet DISSOLVE ONE TABLET UNDER THE TONGUE EVERY 5 MINUTES AS NEEDED FOR CHEST PAIN.  DO NOT EXCEED A TOTAL OF 3 DOSES IN 15 MINUTES (Patient not taking: Reported on 06/10/2019) 25 tablet 5   No current facility-administered medications for this visit.    Past Surgical History:  Procedure Laterality Date   ABDOMINAL HYSTERECTOMY     BIOPSY  03/22/2021   Procedure: BIOPSY;  Surgeon: Wilhelmenia Aloha Raddle., MD;  Location: [facility];  Service: Gastroenterology;;   BREAST SURGERY     begin mass,left  breast   COLONOSCOPY  08/17/2015   Colonic polyp status post polypectomy. Mild pancolonic diverticulosis. Highly redundant colon.    DILATION AND CURETTAGE OF UTERUS     one   ENDOSCOPIC MUCOSAL RESECTION N/A 03/22/2021   Procedure: ENDOSCOPIC MUCOSAL RESECTION;  Surgeon: Wilhelmenia Aloha Raddle., MD;  Location: [facility];  Service: Gastroenterology;  Laterality: N/A;   ENTEROSCOPY N/A 10/06/2022   Procedure: ENTEROSCOPY;  Surgeon: Charlanne Groom, MD;  Location: [facility];  Service: Gastroenterology;  Laterality: N/A;   ESOPHAGOGASTRODUODENOSCOPY  08/17/2015   Moderate  hiatal hernia. Otherwise noraml EGD.    ESOPHAGOGASTRODUODENOSCOPY  09/03/2019   [facility]   ESOPHAGOGASTRODUODENOSCOPY (EGD) WITH PROPOFOL  N/A 03/22/2021   Procedure: ESOPHAGOGASTRODUODENOSCOPY (EGD) WITH PROPOFOL ;  Surgeon: Wilhelmenia Aloha Raddle., MD;  Location: [facility];  Service: Gastroenterology;  Laterality: N/A;   EUS  05/19/2011   Procedure: UPPER ENDOSCOPIC ULTRASOUND (EUS) LINEAR;  Surgeon: Toribio Cedar, MD;  Location: [facility];  Service: Endoscopy;  Laterality: N/A;  radial linear    GIVENS CAPSULE STUDY N/A 10/06/2022   Procedure: GIVENS CAPSULE STUDY;  Surgeon: Charlanne Groom, MD;  Location: [facility];  Service: Gastroenterology;  Laterality: N/A;   HEMOSTASIS CLIP PLACEMENT  03/22/2021   Procedure: HEMOSTASIS CLIP PLACEMENT;  Surgeon: Wilhelmenia Aloha Raddle., MD;  Location: [facility];  Service: Gastroenterology;;   HERNIA REPAIR  05/09/2020   HOT HEMOSTASIS N/A 10/06/2022   Procedure: HOT HEMOSTASIS (ARGON PLASMA COAGULATION/BICAP);  Surgeon: Charlanne Groom, MD;  Location: [facility];  Service: Gastroenterology;  Laterality: N/A;   HYSTEROTOMY     KNEE SURGERY     LAPAROSCOPIC PARAESOPHAGEAL HERNIA REPAIR  12/07/2019   LEFT HEART CATH AND CORONARY ANGIOGRAPHY     LEFT HEART CATH AND CORONARY ANGIOGRAPHY N/A 05/08/2019   Procedure: LEFT HEART CATH AND CORONARY ANGIOGRAPHY;  Surgeon: Verlin Lonni BIRCH, MD;  Location: [facility];  Service: Cardiovascular;  Laterality: N/A;   right thumb     tendon repair   RIGHT/LEFT HEART CATH AND CORONARY ANGIOGRAPHY N/A 06/23/2020   Procedure: RIGHT/LEFT HEART CATH AND CORONARY ANGIOGRAPHY;  Surgeon: Swaziland, Peter M, MD;  Location: [facility];  Service: Cardiovascular;  Laterality: N/A;   SHOULDER SURGERY     rotator cuff repair   SUBMUCOSAL LIFTING INJECTION  03/22/2021   Procedure: SUBMUCOSAL LIFTING INJECTION;  Surgeon: Wilhelmenia Aloha Raddle., MD;  Location: [facility];  Service:  Gastroenterology;;   TRANSFORAMINAL LUMBAR INTERBODY FUSION L4-5  02/27/2020   WRIST SURGERY Right     Family History  Problem Relation Age of Onset   Diabetes Sister    Cancer Sister    Hypertension Daughter    Colon cancer Neg Hx    Liver disease Neg Hx    Pancreatic cancer Neg Hx    Esophageal cancer Neg Hx     Social History   Tobacco Use   Smoking status: Former    Current packs/day: 0.00    Types: Cigarettes    Quit date: 05/17/2011    Years since quitting: 12.6   Smokeless tobacco: Never  Vaping Use   Vaping status: Never Used  Substance Use Topics   Alcohol use: No   Drug use: No    Allergies  Allergen Reactions   Penicillins Rash    Reaction was 30years ago  Has never taken again   Ampicillin Hives   Gabapentin Other (See Comments)     Hallucinations      Review of Systems: All systems reviewed and negative except where noted in HPI.    Physical Exam:     BP 138/72 (BP Location: Left Arm, Patient Position: Sitting, Cuff Size: Normal)   Pulse 66   Ht 5' 5 (1.651 m)   Wt 173 lb 4 oz (78.6 kg)   BMI 28.83 kg/m   Gen: awake, alert, NAD HEENT: anicteric, no pallor Abd: soft, NT/ND, +BS throughout Rectal exam-in presence of Nicole.  Hard stools.  No impaction.  Heme-negative.  Small external hemorrhoids Ext: no c/c/e Neuro: nonfocal     Latest Ref Rng & Units 12/29/2023   10:25 AM 08/29/2023    1:21 PM 05/09/2023    9:01 AM  CBC  WBC 4.0 - 10.5 K/uL 12.1  8.8  7.1   Hemoglobin 12.0 - 15.0 g/dL 87.3  87.8  87.1   Hematocrit 36.0 - 46.0 % 39.6  39.4  38.5   Platelets 150 - 400 K/uL 349  366  258.0       Latest Ref Rng & Units 12/29/2023   10:25 AM 08/29/2023    1:21 PM 05/09/2023    9:01 AM  CMP  Glucose 70 - 99 mg/dL 764  861  897   BUN 8 - 23 mg/dL 19  14  13    Creatinine 0.44 - 1.00 mg/dL 8.94  8.96  8.89   Sodium 135 - 145 mmol/L 141  142  144   Potassium 3.5 - 5.1 mmol/L 3.4  3.3  3.9   Chloride 98 - 111 mmol/L 106  106  108    CO2 22 - 32 mmol/L 21  24  27    Calcium  8.9 - 10.3 mg/dL 9.1  9.3  9.1   Total Protein 6.5 - 8.1 g/dL 6.6  6.5  6.3   Total Bilirubin 0.0 - 1.2 mg/dL 0.2  <9.7  0.4   Alkaline Phos 38 - 126 U/L 110  125  110   AST 15 - 41 U/L 37  23  38   ALT 0 - 44 U/L 58  22  54        Lora Glomski,MD 01/17/2024, 9:19 AM

## 2024-01-17 NOTE — Patient Instructions (Signed)
 _______________________________________________________  If your blood pressure at your visit was 140/90 or greater, please contact your primary care physician to follow up on this.  _______________________________________________________  If you are age 80 or older, your body mass index should be between 23-30. Your Body mass index is 28.83 kg/m. If this is out of the aforementioned range listed, please consider follow up with your Primary Care Provider.  If you are age 26 or younger, your body mass index should be between 19-25. Your Body mass index is 28.83 kg/m. If this is out of the aformentioned range listed, please consider follow up with your Primary Care Provider.   ________________________________________________________  The [facility] providers would like to encourage you to use MYCHART to communicate with providers for non-urgent requests or questions.  Due to long hold times on the telephone, sending your provider a message by [facility] may be a faster and more efficient way to get a response.  Please allow 48 business hours for a response.  Please remember that this is for non-urgent requests.  _______________________________________________________  [facility] is using a team-based approach to care.  Your team is made up of your doctor and two to three APPS. Our APPS (Nurse Practitioners and Physician Assistants) work with your physician to ensure care continuity for you. They are fully qualified to address your health concerns and develop a treatment plan. They communicate directly with your gastroenterologist to care for you. Seeing the Advanced Practice Practitioners on your physician's team can help you by facilitating care more promptly, often allowing for earlier appointments, access to diagnostic testing, procedures, and other specialty referrals.   Continue current medications  Thank you,  Dr. Lynnie Bring

## 2024-01-18 ENCOUNTER — Ambulatory Visit

## 2024-01-18 DIAGNOSIS — M25551 Pain in right hip: Secondary | ICD-10-CM

## 2024-01-18 DIAGNOSIS — M62838 Other muscle spasm: Secondary | ICD-10-CM

## 2024-01-18 DIAGNOSIS — M5459 Other low back pain: Secondary | ICD-10-CM

## 2024-01-18 DIAGNOSIS — R279 Unspecified lack of coordination: Secondary | ICD-10-CM | POA: Diagnosis not present

## 2024-01-18 DIAGNOSIS — M6281 Muscle weakness (generalized): Secondary | ICD-10-CM | POA: Diagnosis not present
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
## 2024-01-19 DIAGNOSIS — J301 Allergic rhinitis due to pollen: Secondary | ICD-10-CM | POA: Diagnosis not present

## 2024-01-23 ENCOUNTER — Ambulatory Visit

## 2024-01-23 DIAGNOSIS — M25551 Pain in right hip: Secondary | ICD-10-CM | POA: Diagnosis not present

## 2024-01-23 DIAGNOSIS — M62838 Other muscle spasm: Secondary | ICD-10-CM

## 2024-01-23 DIAGNOSIS — Z79899 Other long term (current) drug therapy: Secondary | ICD-10-CM | POA: Diagnosis not present

## 2024-01-23 DIAGNOSIS — M6281 Muscle weakness (generalized): Secondary | ICD-10-CM | POA: Diagnosis not present

## 2024-01-23 DIAGNOSIS — R279 Unspecified lack of coordination: Secondary | ICD-10-CM | POA: Diagnosis not present

## 2024-01-23 DIAGNOSIS — M47816 Spondylosis without myelopathy or radiculopathy, lumbar region: Secondary | ICD-10-CM | POA: Diagnosis not present

## 2024-01-23 DIAGNOSIS — M5459 Other low back pain: Secondary | ICD-10-CM | POA: Diagnosis not present

## 2024-01-23 DIAGNOSIS — M961 Postlaminectomy syndrome, not elsewhere classified: Secondary | ICD-10-CM | POA: Diagnosis not present
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
## 2024-01-24 DIAGNOSIS — J301 Allergic rhinitis due to pollen: Secondary | ICD-10-CM | POA: Diagnosis not present

## 2024-01-25 ENCOUNTER — Ambulatory Visit

## 2024-01-25 DIAGNOSIS — M5459 Other low back pain: Secondary | ICD-10-CM | POA: Diagnosis not present

## 2024-01-25 DIAGNOSIS — M25551 Pain in right hip: Secondary | ICD-10-CM

## 2024-01-25 DIAGNOSIS — M62838 Other muscle spasm: Secondary | ICD-10-CM

## 2024-01-25 DIAGNOSIS — M6281 Muscle weakness (generalized): Secondary | ICD-10-CM | POA: Diagnosis not present

## 2024-01-25 DIAGNOSIS — R279 Unspecified lack of coordination: Secondary | ICD-10-CM | POA: Diagnosis not present
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
## 2024-01-29 DIAGNOSIS — I10 Essential (primary) hypertension: Secondary | ICD-10-CM | POA: Diagnosis not present

## 2024-01-30 ENCOUNTER — Ambulatory Visit

## 2024-01-30 DIAGNOSIS — M25551 Pain in right hip: Secondary | ICD-10-CM | POA: Diagnosis not present

## 2024-01-30 DIAGNOSIS — M6281 Muscle weakness (generalized): Secondary | ICD-10-CM | POA: Diagnosis not present

## 2024-01-30 DIAGNOSIS — M62838 Other muscle spasm: Secondary | ICD-10-CM

## 2024-01-30 DIAGNOSIS — M47816 Spondylosis without myelopathy or radiculopathy, lumbar region: Secondary | ICD-10-CM | POA: Diagnosis not present

## 2024-01-30 DIAGNOSIS — R279 Unspecified lack of coordination: Secondary | ICD-10-CM | POA: Diagnosis not present

## 2024-01-30 DIAGNOSIS — M5459 Other low back pain: Secondary | ICD-10-CM | POA: Diagnosis not present
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
## 2024-01-31 DIAGNOSIS — R918 Other nonspecific abnormal finding of lung field: Secondary | ICD-10-CM | POA: Diagnosis not present

## 2024-01-31 DIAGNOSIS — J4540 Moderate persistent asthma, uncomplicated: Secondary | ICD-10-CM | POA: Diagnosis not present

## 2024-01-31 DIAGNOSIS — J301 Allergic rhinitis due to pollen: Secondary | ICD-10-CM | POA: Diagnosis not present

## 2024-01-31 DIAGNOSIS — R053 Chronic cough: Secondary | ICD-10-CM | POA: Diagnosis not present

## 2024-01-31 DIAGNOSIS — G4733 Obstructive sleep apnea (adult) (pediatric): Secondary | ICD-10-CM | POA: Diagnosis not present

## 2024-02-01 ENCOUNTER — Ambulatory Visit: Admitting: Physical Therapy

## 2024-02-01 DIAGNOSIS — M5459 Other low back pain: Secondary | ICD-10-CM | POA: Diagnosis not present

## 2024-02-01 DIAGNOSIS — M6281 Muscle weakness (generalized): Secondary | ICD-10-CM

## 2024-02-01 DIAGNOSIS — M25551 Pain in right hip: Secondary | ICD-10-CM | POA: Diagnosis not present

## 2024-02-01 DIAGNOSIS — R279 Unspecified lack of coordination: Secondary | ICD-10-CM | POA: Diagnosis not present

## 2024-02-01 DIAGNOSIS — M62838 Other muscle spasm: Secondary | ICD-10-CM | POA: Diagnosis not present
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
## 2024-02-06 ENCOUNTER — Ambulatory Visit

## 2024-02-06 DIAGNOSIS — J301 Allergic rhinitis due to pollen: Secondary | ICD-10-CM | POA: Diagnosis not present

## 2024-02-06 DIAGNOSIS — R053 Chronic cough: Secondary | ICD-10-CM | POA: Diagnosis not present

## 2024-02-06 DIAGNOSIS — E785 Hyperlipidemia, unspecified: Secondary | ICD-10-CM | POA: Diagnosis not present

## 2024-02-06 DIAGNOSIS — M6281 Muscle weakness (generalized): Secondary | ICD-10-CM | POA: Diagnosis not present

## 2024-02-06 DIAGNOSIS — M62838 Other muscle spasm: Secondary | ICD-10-CM

## 2024-02-06 DIAGNOSIS — R918 Other nonspecific abnormal finding of lung field: Secondary | ICD-10-CM | POA: Diagnosis not present

## 2024-02-06 DIAGNOSIS — R279 Unspecified lack of coordination: Secondary | ICD-10-CM

## 2024-02-06 DIAGNOSIS — I10 Essential (primary) hypertension: Secondary | ICD-10-CM | POA: Diagnosis not present

## 2024-02-06 DIAGNOSIS — M5459 Other low back pain: Secondary | ICD-10-CM

## 2024-02-06 DIAGNOSIS — M25551 Pain in right hip: Secondary | ICD-10-CM | POA: Diagnosis not present

## 2024-02-06 DIAGNOSIS — E1140 Type 2 diabetes mellitus with diabetic neuropathy, unspecified: Secondary | ICD-10-CM | POA: Diagnosis not present

## 2024-02-06 DIAGNOSIS — J4540 Moderate persistent asthma, uncomplicated: Secondary | ICD-10-CM | POA: Diagnosis not present
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
## 2024-02-07 ENCOUNTER — Ambulatory Visit (INDEPENDENT_AMBULATORY_CARE_PROVIDER_SITE_OTHER)

## 2024-02-07 ENCOUNTER — Ambulatory Visit (INDEPENDENT_AMBULATORY_CARE_PROVIDER_SITE_OTHER): Admitting: Podiatry

## 2024-02-07 ENCOUNTER — Encounter: Payer: Self-pay | Admitting: Oncology

## 2024-02-07 DIAGNOSIS — M2141 Flat foot [pes planus] (acquired), right foot: Secondary | ICD-10-CM

## 2024-02-07 DIAGNOSIS — M898X9 Other specified disorders of bone, unspecified site: Secondary | ICD-10-CM

## 2024-02-07 DIAGNOSIS — Z8659 Personal history of other mental and behavioral disorders: Secondary | ICD-10-CM

## 2024-02-07 DIAGNOSIS — M2041 Other hammer toe(s) (acquired), right foot: Secondary | ICD-10-CM | POA: Diagnosis not present

## 2024-02-07 DIAGNOSIS — M7741 Metatarsalgia, right foot: Secondary | ICD-10-CM

## 2024-02-07 DIAGNOSIS — G8929 Other chronic pain: Secondary | ICD-10-CM

## 2024-02-07 DIAGNOSIS — M2142 Flat foot [pes planus] (acquired), left foot: Secondary | ICD-10-CM | POA: Diagnosis not present

## 2024-02-07 DIAGNOSIS — M79674 Pain in right toe(s): Secondary | ICD-10-CM

## 2024-02-07 DIAGNOSIS — J301 Allergic rhinitis due to pollen: Secondary | ICD-10-CM | POA: Diagnosis not present

## 2024-02-07 DIAGNOSIS — M2042 Other hammer toe(s) (acquired), left foot: Secondary | ICD-10-CM

## 2024-02-07 DIAGNOSIS — M7742 Metatarsalgia, left foot: Secondary | ICD-10-CM | POA: Diagnosis not present

## 2024-02-07 MED ORDER — LORAZEPAM 1 MG PO TABS: 1.0000 mg | ORAL_TABLET | ORAL | 0 refills | Status: AC

## 2024-02-07 NOTE — Progress Notes (Signed)
 Chief Complaint  Patient presents with   Foot Pain    Right foot and ankle pain. The big toe hurts on the tip of the toe, she had nail removed 5 years ago, she's asking if they could have missed something. It feels tingles but not burning, some times it is numb.  The ankle hurts on the lateral side, and does appear to be swollen.  A1c 6.5 in July ASA    HPI: 78 y.o. female presents today with pain at the tip of the right great toe x 5 years.  She states that approximately 5 years ago she had the toenail removed for an ingrown toenail.  She notes that she gets some patchy regrowth of nail tissue, but states that the pain is not at the nail site.  She keeps pointing to the tip of the toe when explaining where the pain is.  She states that it burns and stings and gives her a lot of discomfort with any pressure.  It has been slowly progressing in severity over the past couple years.  She also has secondary concern of right ankle pain.  She points to the lateral ankle.  She states that she does have an ankle brace at home that she wears occasionally.  This does give her relief when she wears it.  Past Medical History:  Diagnosis Date   Aftercare following surgery 03/26/2020   Allergy 03/29/2021   Angina pectoris 06/17/2020   Anxiety    Arthritis    all over   Asthma    Follows w/ Dr. Marina, pulmonologist.   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   Chronic kidney disease    Chronic obstructive pulmonary disease (HCC) 02/09/2017   Formatting of this note might be different from the original. Last Assessment & Plan:  I will review her PFTs after I obtain records from her pulmonologist. Based on this we will decide if she needs  inhalers, subjectively they do not seem to have helped her at all. She may benefit from a pulmonary rehabilitation program Formatting of this note might be different from the original. Last Assessment    COPD (chronic obstructive pulmonary disease) (HCC) 02/09/2017   Coronary artery disease involving native coronary artery of native heart without angina pectoris 06/10/2015   Depression    Diabetes mellitus due to underlying condition with unspecified complications (HCC) 06/10/2015   Dyslipidemia 06/10/2015   Esophageal dysphagia 09/02/2019   Formatting of this note might be different from the original.  Added automatically from request for surgery [id number]     Essential hypertension 06/10/2015   Gastroparesis 09/04/2019   GERD (gastroesophageal reflux disease)    H/O hiatal hernia    Headache 09/24/2018   Heart murmur    Heme positive stool 10/06/2022   Hyperlipemia    Hypertension    Iron deficiency anemia 09/25/2020   Lumbar radicular pain 08/08/2022   Lumbar radiculopathy 12/18/2020   Mild aortic stenosis 05/16/2019   Mixed dyslipidemia 06/10/2015   Moderate aortic stenosis 05/16/2019   Nausea and vomiting 06/16/2020   occasional   Neurogenic claudication 12/18/2020   OSA (obstructive sleep apnea) 02/09/2017   Pain and swelling of toe of right foot  07/15/2016   Palpitations 06/02/2016   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 Mild pancolonic diverticulosis. Highly redundant colon.    DILATION AND CURETTAGE OF UTERUS     one    ENDOSCOPIC MUCOSAL RESECTION N/A 03/22/2021   Procedure: ENDOSCOPIC MUCOSAL RESECTION;  Surgeon: Wilhelmenia Aloha Raddle., MD;  Location: [facility];  Service: Gastroenterology;  Laterality: N/A;   ENTEROSCOPY N/A 10/06/2022   Procedure: ENTEROSCOPY;  Surgeon: Charlanne Groom, MD;  Location: [facility];  Service: Gastroenterology;  Laterality: N/A;   ESOPHAGOGASTRODUODENOSCOPY  08/17/2015   Moderate hiatal hernia. Otherwise noraml EGD.    ESOPHAGOGASTRODUODENOSCOPY  09/03/2019   [facility]   ESOPHAGOGASTRODUODENOSCOPY (EGD) WITH PROPOFOL  N/A 03/22/2021   Procedure: ESOPHAGOGASTRODUODENOSCOPY (EGD) WITH PROPOFOL ;  Surgeon: Wilhelmenia Aloha Raddle., MD;  Location: [facility];  Service: Gastroenterology;  Laterality: N/A;   EUS  05/19/2011   Procedure: UPPER ENDOSCOPIC ULTRASOUND (EUS) LINEAR;  Surgeon: Toribio Cedar, MD;  Location: [facility];  Service: Endoscopy;  Laterality: N/A;  radial linear    GIVENS CAPSULE STUDY N/A 10/06/2022   Procedure: GIVENS CAPSULE STUDY;  Surgeon: Charlanne Groom, MD;  Location: [facility];  Service: Gastroenterology;  Laterality: N/A;   HEMOSTASIS CLIP PLACEMENT  03/22/2021   Procedure: HEMOSTASIS CLIP PLACEMENT;  Surgeon: Wilhelmenia Aloha Raddle., MD;  Location: [facility];  Service: Gastroenterology;;   HERNIA REPAIR  05/09/2020   HOT HEMOSTASIS N/A 10/06/2022   Procedure: HOT HEMOSTASIS (ARGON PLASMA COAGULATION/BICAP);  Surgeon: Charlanne Groom, MD;  Location: [facility];  Service: Gastroenterology;  Laterality: N/A;   HYSTEROTOMY     KNEE SURGERY     LAPAROSCOPIC PARAESOPHAGEAL HERNIA REPAIR  12/07/2019   LEFT HEART CATH AND CORONARY ANGIOGRAPHY     LEFT HEART CATH AND CORONARY ANGIOGRAPHY N/A 05/08/2019   Procedure: LEFT HEART CATH AND CORONARY ANGIOGRAPHY;  Surgeon: Verlin Lonni BIRCH, MD;  Location: [facility];  Service: Cardiovascular;  Laterality: N/A;   right thumb     tendon repair   RIGHT/LEFT HEART CATH AND  CORONARY ANGIOGRAPHY N/A 06/23/2020   Procedure: RIGHT/LEFT HEART CATH AND CORONARY ANGIOGRAPHY;  Surgeon: Swazil[place], Peter M, MD;  Location: [facility];  Service: Cardiovascular;  Laterality: N/A;   SHOULDER SURGERY     rotator cuff repair   SUBMUCOSAL LIFTING INJECTION  03/22/2021   Procedure: SUBMUCOSAL LIFTING INJECTION;  Surgeon: Wilhelmenia Aloha Raddle., MD;  Location: [facility];  Service: Gastroenterology;;   TRANSFORAMINAL LUMBAR INTERBODY FUSION L4-5   02/27/2020   WRIST SURGERY Right    Allergies  Allergen Reactions   Penicillins Rash    Reaction was 30years ago  Has never taken again   Ampicillin Hives   Gabapentin Other (See Comments)     Hallucinations     Physical Exam: Palpable pedal pulses.  No edema or erythema is noted at the right great toe.  There is moderate pain on palpation to the distal-dorsal pulp of the toe.  Her shoes were reviewed and it does not appear that  her shoes are too short in length.  No pain on palpation to the nailbed.  No pain with range of motion of the first MPJ or first IPJ.  Pain on palpation along the peroneal tendons on the right ankle just posterior to the lateral malleolus.  Epicritic sensation intact  Radiographic Exam right foot and ankle, 5 weightbearing views, 02/07/2024:  Normal osseous mineralization.  There is an osseous abnormality at the distal tip of the right great toe at the tuft of the distal phalanx.  There appear to be some erosive findings with some periosteal fluffiness along the distal-lateral aspect of the distal portion of the distal phalanx.  No acute fracture is noted.  Assessment/Plan of Care: 1. Hammertoes of both feet   2. Pes planus of both feet   3. Metatarsalgia of both feet   4. Chronic toe pain, right foot   5. Bone mass   6. History of claustrophobia      Meds ordered this encounter  Medications   LORazepam (ATIVAN) 1 MG tablet    Sig: Take 1 tablet (1 mg total) by mouth as directed for 1 day.  Take tablet 1 to 2 hours prior to MRI    Dispense:  1 tablet    Refill:  0   MR TOES RIGHT WO CONTRAST  Discussed findings with the patient today.  I do have concern of the osseous abnormality seen at the distal phalanx.  I do not feel this could be osteomyelitis at this time due to the clinical presentation negative for an infectious process.  However, her pain apparently did present soon after having the toenail removed.  Patient was informed that there are bone tumors, such as an osteochondroma, that can appear at the distal phalanx.  Due to her progressively worsening pain with no recent injury, I feel it is necessary to proceed with an MRI to evaluate the area further.  Patient was in agreement.  MRI was ordered for the right toes without contrast.  She noted she gets very claustrophobic when in tight, enclosed spaces.  Will send in prescription for one Ativan 1 mg for her to take 1 to 2 hours prior to the MRI.  She was instructed to have someone drive her to/from the MRI because this medication will cause drowsiness.  Regarding the peroneal tendinitis, the patient was offered a corticosteroid injection into the area to provide relief.  She stated that she has had so many cortisone injections all over her body, that she does not want any more cortisone injections right now.  Therefore recommended she massage Voltaren gel into the area twice daily.  We could also refer for physical therapy if she would like to proceed with this.  Will hold off at this time.  Follow-up after MRI  Awanda CHARM Imperial, DPM, FACFAS [facility]     [address] [place], [place] [address]                Office [phone]  Fax [phone]

## 2024-02-14 ENCOUNTER — Ambulatory Visit: Attending: Nurse Practitioner | Admitting: Physical Therapy

## 2024-02-14 DIAGNOSIS — J301 Allergic rhinitis due to pollen: Secondary | ICD-10-CM | POA: Diagnosis not present

## 2024-02-15 ENCOUNTER — Ambulatory Visit (INDEPENDENT_AMBULATORY_CARE_PROVIDER_SITE_OTHER)
Admission: RE | Admit: 2024-02-15 | Discharge: 2024-02-15 | Disposition: A | Discharge status: Home / Self Care | Source: Ambulatory Visit | Attending: Podiatry | Admitting: Podiatry

## 2024-02-15 DIAGNOSIS — G8929 Other chronic pain: Secondary | ICD-10-CM | POA: Diagnosis not present

## 2024-02-15 DIAGNOSIS — M47816 Spondylosis without myelopathy or radiculopathy, lumbar region: Secondary | ICD-10-CM | POA: Diagnosis not present

## 2024-02-15 DIAGNOSIS — M898X9 Other specified disorders of bone, unspecified site: Secondary | ICD-10-CM | POA: Diagnosis not present

## 2024-02-15 DIAGNOSIS — M79674 Pain in right toe(s): Secondary | ICD-10-CM | POA: Diagnosis not present

## 2024-02-15 DIAGNOSIS — M19071 Primary osteoarthritis, right ankle and foot: Secondary | ICD-10-CM | POA: Diagnosis not present

## 2024-02-18 ENCOUNTER — Ambulatory Visit: Payer: Self-pay | Admitting: Podiatry

## 2024-02-19 NOTE — Progress Notes (Unsigned)
 NEUROLOGY FOLLOW UP OFFICE NOTE  Deborah Hutchinson [id number]  Assessment/Plan:   Horizontal diplopia with left gaze, still suspect secondary to head/right eye injury but since not resolved, further evaluation warranted. Tremor - stable  *** Further recommendations pending results.  Total time spent face to face with patient and reviewing notes:  20 minutes  Subjective:  Deborah Hutchinson is a 78 year old female with iron-deficiency anemia, DM II, CAD, mesothelioma, orthostatic hypotension, and HTN who follows up for recent head injury.  She is accompanied by her daughter and granddaughter who supplements history.  UPDATE: Last seen in August 2024. Due to ongoing diplopia, MRI of brain with and without contrast was performed on 02/11/2023 which revealed moderate chronic small vessel ischemic changes but no specific posttraumatic finding or other explanation for headache and diplopia.     HISTORY: Tremors: She started noticing tremors in the hands in early 2023.  When she is not paying attention, her hands shake, right worse than left.  It doesn't occur with action, only at rest.  No changes in gait, balance, vision, swallowing.  Her daughter thinks it is getting worse. No new medications started just prior to onset of symptoms.  No know family history of tremor.    Headache/intracranial stenosis: She began having headaches in 2017-2018.  They only occur when she has episodes of dyspnea.  She suddenly feels short of breath and lightheaded but doesn't pass out.  It is not exertional and happens at rest.  It does not occur out of sleep.  She has asthma and chronic bronchitis.  When these episodes occur, she develops 10/10 right sided stabbing headache.  She has associated weakness but denies associated neck pain, nausea, vomiting, photophobia, phonophobia, visual disturbance or unilateral numbness or weakness.  It typically lasts 5 minutes, about as long as the shortness of breath until  she uses her inhaler.  Frequency varies.  She may have a week without episodes or twice a week or some weeks it may occur several times.  She also has longstanding history of tinnitus, which she describes as sounding like bees in her head.  She was evaluated by ENT in April 2019.  Hearing test and tympanogram were unremarkable.  When she gets these headaches, the ringing sounds louder.  She denies prior history of headaches.  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  No aneurysm.  Due to the severe right cavernous carotid stenosis, ASA was switched to Plavix , which was first discussed with her cardiologist, Dr. Edwyna.   Posttraumatic headache: On 07/13/2022, she fell striking the right side of her face and sustained a right orbital fracture.  She went to [facility] and had a head scan (probably CT scan) which did not reveal an acute intracranial abnormality.  She has had a headache ever since.  It is a persistent right frontal throbbing headache.  When she wakes up in the morning, she has horizontal double vision for 20 minutes.  It hurts moving her right eye up and down.  She saw optometry in August 2024 who didn't note any abnormalities on exam.  Overall that has been improving.  However, headaches are getting more severe.  No nausea, vomiting, dizziness, photophobia.    Past medications:  pregablin (hallucinations), gabapentin (side effects)  PAST MEDICAL HISTORY: Past Medical History:  Diagnosis Date   Aftercare following surgery 03/26/2020   Allergy  03/29/2021   Angina pectoris 06/17/2020   Anxiety    Arthritis    all over   Asthma    Follows w/ Dr. Marina, pulmonologist.   Bronchitis    CAD (coronary artery disease) 02/13/2019   Cancer (HCC)    mesothelioma of diaphragm, spot on lung also, probable bladder cancer   Chest pain 05/08/2019   Chest pain of  uncertain etiology    Chronic bilateral low back pain without sciatica 07/11/2019   Pain goes down into thighs.   Chronic kidney disease    Chronic obstructive pulmonary disease (HCC) 02/09/2017   Formatting of this note might be different from the original. Last Assessment & Plan:  I will review her PFTs after I obtain records from her pulmonologist. Based on this we will decide if she needs inhalers, subjectively they do not seem to have helped her at all. She may benefit from a pulmonary rehabilitation program Formatting of this note might be different from the original. Last Assessment    COPD (chronic obstructive pulmonary disease) (HCC) 02/09/2017   Coronary artery disease involving native coronary artery of native heart without angina pectoris 06/10/2015   Depression    Diabetes mellitus due to underlying condition with unspecified complications (HCC) 06/10/2015   Dyslipidemia 06/10/2015   Esophageal dysphagia 09/02/2019   Formatting of this note might be different from the original.  Added automatically from request for surgery [id number]     Essential hypertension 06/10/2015   Gastroparesis 09/04/2019   GERD (gastroesophageal reflux disease)    H/O hiatal hernia    Headache 09/24/2018   Heart murmur    Heme positive stool 10/06/2022   Hyperlipemia    Hypertension    Iron deficiency anemia 09/25/2020   Lumbar radicular pain 08/08/2022   Lumbar radiculopathy 12/18/2020   Mild aortic stenosis 05/16/2019   Mixed dyslipidemia 06/10/2015   Moderate aortic stenosis 05/16/2019   Nausea and vomiting 06/16/2020   occasional   Neurogenic claudication 12/18/2020   OSA (obstructive sleep apnea) 02/09/2017   Pain and swelling of toe of right foot 07/15/2016   Palpitations 06/02/2016   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   Preop cardiovascular exam 09/12/2022   S/P lumbar fusion 03/26/2020   Shortness of breath    on  excertion   Sleep apnea     MEDICATIONS: Current Outpatient  Medications on File Prior to Visit  Medication Sig Dispense Refill   Accu-Chek FastClix Lancets MISC Apply 102 each topically 2 (two) times daily.     ACCU-CHEK GUIDE TEST test strip 1 each by Other route as needed for other.     albuterol  (PROVENTIL  HFA;VENTOLIN  HFA) 108 (90 Base) MCG/ACT inhaler Inhale 2 puffs into the lungs every 6 (six) hours as needed for wheezing or shortness of breath. 3 Inhaler 3   albuterol  (PROVENTIL ) (2.5 MG/3ML) 0.083% nebulizer solution Take 2.5 mg by nebulization as needed for shortness of breath or wheezing.     amLODipine  (NORVASC ) 10 MG tablet Take 10 mg by mouth daily.     aspirin  EC 81 MG tablet Take 81 mg by mouth daily. Swallow whole.     benzonatate (TESSALON) 200 MG capsule Take 200 mg by mouth at bedtime as needed for cough.     buPROPion  (WELLBUTRIN  XL) 300 MG 24 hr tablet Take 300 mg by mouth in the morning.     CALCIUM  PO Take 1 tablet by mouth daily.     Cholecalciferol (VITAMIN D3) 1.25 MG (50000 UT) CAPS Take 50,000 Units  by mouth every Wednesday.     donepezil (ARICEPT) 10 MG tablet Take 10 mg by mouth daily at 12 noon.     EPINEPHrine  0.3 mg/0.3 mL IJ SOAJ injection Inject 0.3 mg into the muscle as needed for anaphylaxis.     escitalopram  (LEXAPRO ) 20 MG tablet Take 20 mg by mouth at bedtime.     famotidine  (PEPCID ) 40 MG tablet Take 1 tablet (40 mg total) by mouth 2 (two) times daily. 180 tablet 1   Ferrous Sulfate (IRON PO) Take 1 tablet by mouth in the morning.     fluticasone  (FLONASE ) 50 MCG/ACT nasal spray Place 2 sprays into both nostrils daily.     Fluticasone -Umeclidin-Vilant (TRELEGY ELLIPTA) 200-62.5-25 MCG/INH AEPB Inhale 2 puffs into the lungs daily.     GI Cocktail (alum & mag hydroxide, lidocaine , dicyclomine) oral mixture Take 15-30 mL by mouth every 8 hours as needed. 240 mL 2   hydrocortisone  (ANUCORT-HC ) 25 MG suppository INSERT RECTALLY AT BEDTIME FOR 5 NIGHTS 5 suppository 0   hydrocortisone -pramoxine (PROCTOFOAM-HC) rectal  foam Place 1 applicator rectally 2 (two) times daily as needed for hemorrhoids or anal itching.     isosorbide  mononitrate (IMDUR ) 120 MG 24 hr tablet Take 1 tablet by mouth once daily 60 tablet 6   JARDIANCE 10 MG TABS tablet Take 10 mg by mouth daily.     levocetirizine (XYZAL ) 5 MG tablet TAKE 1 TABLET BY MOUTH ONCE DAILY IN THE EVENING 30 tablet 0   linaclotide  (LINZESS ) 72 MCG capsule Take 72 mcg by mouth as needed (constipation).     methocarbamol (ROBAXIN) 500 MG tablet Take 500 mg by mouth 3 (three) times daily.     montelukast  (SINGULAIR ) 10 MG tablet Take 10 mg by mouth at bedtime.      Multiple Vitamin (MULTIVITAMIN) capsule Take 1 capsule by mouth in the morning.  Women's Multivitamin Gummy     nitroGLYCERIN  (NITROSTAT ) 0.4 MG SL tablet DISSOLVE 1 TABLET UNDER THE TONGUE AS NEEDED FOR CHEST PAIN EVERY 5 MINUTES UP TO 3 TIMES. IF NO RELIEF CALL 911. *NEW PRESCRIPTION REQUEST* 75 tablet 10   nystatin cream (MYCOSTATIN) Apply 1 Application topically 2 (two) times daily as needed (skin irritation/rash).     oxybutynin  (DITROPAN ) 5 MG tablet Take 1 tablet (5 mg total) by mouth every 8 (eight) hours as needed for bladder spasms. 30 tablet 1   OZEMPIC, 1 MG/DOSE, 4 MG/3ML SOPN Inject 1 mg into the skin once a week.     pantoprazole  (PROTONIX ) 40 MG tablet Take 1 tablet (40 mg total) by mouth daily. 90 tablet 3   rosuvastatin  (CRESTOR ) 20 MG tablet Take 1 tablet (20 mg total) by mouth daily. 90 tablet 3   traZODone (DESYREL) 50 MG tablet Take 50 mg by mouth at bedtime.     vitamin B-12 (CYANOCOBALAMIN) 500 MCG tablet Take 500 mcg by mouth in the morning.     Vitamin D , Ergocalciferol , (DRISDOL) 1.25 MG (50000 UNIT) CAPS capsule Take 50,000 Units by mouth every 30 (thirty) days.     No current facility-administered medications on file prior to visit.    ALLERGIES: Allergies  Allergen Reactions   Penicillins Rash    Reaction was 30years ago  Has never taken again   Ampicillin Hives    Gabapentin Other (See Comments)     Hallucinations     FAMILY HISTORY: Family History  Problem Relation Age of Onset   Diabetes Sister    Cancer Sister  Hypertension Daughter    Colon cancer Neg Hx    Liver disease Neg Hx    Pancreatic cancer Neg Hx    Esophageal cancer Neg Hx       Objective:  *** General: No acute distress.  Patient appears well-groomed.   Head:  Normocephalic/atraumatic Neck:  Supple.  No paraspinal tenderness.  Full range of motion. Heart:  Regular rate and rhythm. Neuro:  Alert and oriented.  Speech fluent and not dysarthric.  Language intact.  CN II-XII intact.  Bulk and tone normal.  Muscle strength 5/5 throughout.  Sensation to light touch intact.  Deep tendon reflexes 2+ throughout, toes downgoing.  Gait normal.  Romberg negative.     Juliene Dunnings, DO  CC: Brad Badder, MD

## 2024-02-20 ENCOUNTER — Encounter: Payer: Self-pay | Admitting: Neurology

## 2024-02-20 ENCOUNTER — Ambulatory Visit: Admitting: Neurology

## 2024-02-20 VITALS — BP 122/78 | HR 71 | Ht 66.0 in | Wt 167.0 lb

## 2024-02-20 DIAGNOSIS — H538 Other visual disturbances: Secondary | ICD-10-CM | POA: Diagnosis not present

## 2024-02-20 DIAGNOSIS — G44329 Chronic post-traumatic headache, not intractable: Secondary | ICD-10-CM

## 2024-02-20 MED ORDER — DULOXETINE HCL 30 MG PO CPEP: ORAL_CAPSULE | ORAL | 0 refills | Status: DC

## 2024-02-20 NOTE — Patient Instructions (Addendum)
 Discontinue escitalopram . 2.   Start duloxetine 30mg  - take 1 pill daily for 2 weeks, then increase to 2 pills daily.  Contact me for refill 3.  Check bilateral carotid ultrasound

## 2024-02-21 ENCOUNTER — Ambulatory Visit

## 2024-02-21 ENCOUNTER — Encounter: Payer: Self-pay | Admitting: Oncology

## 2024-02-21 DIAGNOSIS — N3281 Overactive bladder: Secondary | ICD-10-CM | POA: Diagnosis not present

## 2024-02-23 ENCOUNTER — Ambulatory Visit (INDEPENDENT_AMBULATORY_CARE_PROVIDER_SITE_OTHER): Admitting: Podiatry

## 2024-02-23 DIAGNOSIS — J301 Allergic rhinitis due to pollen: Secondary | ICD-10-CM | POA: Diagnosis not present

## 2024-02-23 DIAGNOSIS — M898X9 Other specified disorders of bone, unspecified site: Secondary | ICD-10-CM

## 2024-02-23 DIAGNOSIS — M898X7 Other specified disorders of bone, ankle and foot: Secondary | ICD-10-CM | POA: Diagnosis not present

## 2024-02-23 DIAGNOSIS — E1169 Type 2 diabetes mellitus with other specified complication: Secondary | ICD-10-CM | POA: Diagnosis not present

## 2024-02-23 DIAGNOSIS — G8929 Other chronic pain: Secondary | ICD-10-CM | POA: Diagnosis not present

## 2024-02-23 DIAGNOSIS — M79674 Pain in right toe(s): Secondary | ICD-10-CM

## 2024-02-23 NOTE — Progress Notes (Unsigned)
 Chief Complaint  Patient presents with   MRI Results    Mri results today for the right foot. She stated the the hallux on the right foot feel like an ingrown as well, I did explain that we may need to set up a separate appointment for this depending on the time. There is very little nail but there is something visibel.  A1c unknown thinks it was 5.7 ASA   HPI: 78 y.o. female presents today to review her MRI results.  She was having chronic pain to the dorsal-distal aspect of the right great toe.  Patient states her blood sugar/A1c is under good control.  She states that it is definitely under 7, but that she is due for new blood work by her family doctor soon.  Past Medical History:  Diagnosis Date   Aftercare following surgery 03/26/2020   Allergy 03/29/2021   Angina pectoris 06/17/2020   Anxiety    Arthritis    all over   Asthma    Follows w/ Dr. Marina, pulmonologist.   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   Chronic kidney disease    Chronic obstructive pulmonary disease (HCC) 02/09/2017   Formatting of this note might be different from the original. Last Assessment & Plan:  I will review her PFTs after I obtain records from her pulmonologist. Based on this we will decide if she needs inhalers, subjectively they do not seem to have helped her at all. She may benefit from a pulmonary rehabilitation program Formatting of this note might be different from the original. Last Assessment    COPD (chronic obstructive pulmonary disease) (HCC) 02/09/2017   Coronary artery disease involving native coronary artery of native heart without angina pectoris 06/10/2015   Depression    Diabetes mellitus due to underlying condition with  unspecified complications (HCC) 06/10/2015   Dyslipidemia 06/10/2015   Esophageal dysphagia 09/02/2019   Formatting of this note might be different from the original.  Added automatically from request for surgery [id number]     Essential hypertension 06/10/2015   Gastroparesis 09/04/2019   GERD (gastroesophageal reflux disease)    H/O hiatal hernia    Headache 09/24/2018   Heart murmur    Heme positive stool 10/06/2022   Hyperlipemia    Hypertension    Iron deficiency anemia 09/25/2020   Lumbar radicular pain 08/08/2022   Lumbar radiculopathy 12/18/2020   Mild aortic stenosis 05/16/2019   Mixed dyslipidemia 06/10/2015   Moderate aortic stenosis 05/16/2019   Nausea and vomiting 06/16/2020   occasional   Neurogenic claudication 12/18/2020   OSA (obstructive sleep apnea) 02/09/2017   Pain and swelling of toe of right foot 07/15/2016   Palpitations 06/02/2016   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 Mild pancolonic diverticulosis. Highly redundant colon.    DILATION AND CURETTAGE OF UTERUS     one   ENDOSCOPIC MUCOSAL RESECTION N/A 03/22/2021   Procedure: ENDOSCOPIC MUCOSAL RESECTION;  Surgeon: Wilhelmenia Aloha Raddle., MD;  Location: [facility];  Service: Gastroenterology;  Laterality: N/A;   ENTEROSCOPY N/A 10/06/2022   Procedure: ENTEROSCOPY;  Surgeon: Charlanne Groom, MD;  Location: [facility];  Service: Gastroenterology;  Laterality: N/A;   ESOPHAGOGASTRODUODENOSCOPY  08/17/2015   Moderate hiatal hernia. Otherwise noraml EGD.     ESOPHAGOGASTRODUODENOSCOPY  09/03/2019   [facility]   ESOPHAGOGASTRODUODENOSCOPY (EGD) WITH PROPOFOL  N/A 03/22/2021   Procedure: ESOPHAGOGASTRODUODENOSCOPY (EGD) WITH PROPOFOL ;  Surgeon: Wilhelmenia Aloha Raddle., MD;  Location: [facility];  Service: Gastroenterology;  Laterality: N/A;   EUS  05/19/2011   Procedure: UPPER ENDOSCOPIC ULTRASOUND (EUS) LINEAR;  Surgeon: Toribio Cedar, MD;  Location: [facility];  Service: Endoscopy;  Laterality: N/A;  radial linear    GIVENS CAPSULE STUDY N/A 10/06/2022   Procedure: GIVENS CAPSULE STUDY;  Surgeon: Charlanne Groom, MD;  Location: [facility];  Service: Gastroenterology;  Laterality: N/A;   HEMOSTASIS CLIP PLACEMENT  03/22/2021   Procedure: HEMOSTASIS CLIP PLACEMENT;  Surgeon: Wilhelmenia Aloha Raddle., MD;  Location: [facility];  Service: Gastroenterology;;   HERNIA REPAIR  05/09/2020   HOT HEMOSTASIS N/A 10/06/2022   Procedure: HOT HEMOSTASIS (ARGON PLASMA COAGULATION/BICAP);  Surgeon: Charlanne Groom, MD;  Location: [facility];  Service: Gastroenterology;  Laterality: N/A;   HYSTEROTOMY     KNEE SURGERY     LAPAROSCOPIC PARAESOPHAGEAL HERNIA REPAIR  12/07/2019   LEFT HEART CATH AND CORONARY ANGIOGRAPHY     LEFT HEART CATH AND CORONARY ANGIOGRAPHY N/A 05/08/2019   Procedure: LEFT HEART CATH AND CORONARY ANGIOGRAPHY;  Surgeon: Verlin Lonni BIRCH, MD;  Location: [facility];  Service: Cardiovascular;  Laterality: N/A;   right thumb     tendon repair   RIGHT/LEFT HEART CATH AND CORONARY ANGIOGRAPHY N/A 06/23/2020   Procedure: RIGHT/LEFT HEART CATH AND CORONARY ANGIOGRAPHY;  Surgeon: [place]az[place], Peter M, MD;  Location: [facility];  Service: Cardiovascular;  Laterality: N/A;   SHOULDER SURGERY     rotator cuff repair   SUBMUCOSAL LIFTING INJECTION  03/22/2021   Procedure: SUBMUCOSAL LIFTING INJECTION;  Surgeon: Wilhelmenia Aloha Raddle., MD;  Location: [facility];  Service: Gastroenterology;;   TRANSFORAMINAL  LUMBAR INTERBODY FUSION L4-5   02/27/2020   WRIST SURGERY Right    Allergies  Allergen Reactions   Penicillins Rash    Reaction was 30years ago  Has never taken again   Ampicillin Hives   Gabapentin Other (See Comments)     Hallucinations      Physical Exam: General: The patient is alert and oriented x3 in no acute distress.  Dermatology: Skin is warm, dry and supple bilateral lower extremities. Interspaces are clear of maceration and debris.  There are 2 small areas of nail/keratin buildup on the right hallux nail bed.  No surrounding erythema is noted.  There is no pain on palpation to this area.  Vascular: Palpable pedal pulses bilaterally. Capillary refill within normal limits.  No appreciable edema.  No erythema or calor.  Neurological: Light touch sensation grossly intact bilateral feet.   Musculoskeletal Exam: There is a slightly prominent distal pulp of the toe along the dorsal-distal aspect with pain on palpation of the dorsal/distal aspect of the right great toe.  No ecchymosis is noted.  MRI results, right toes,  date of study 02/15/2024:   Assessment/Plan of Care: 1. Chronic toe pain, right foot   2. Bone mass   3. Exostosis of right foot   4. Type 2 diabetes mellitus with other specified complication, without long-term current use of insulin  [facility])    Discussed clinical and MRI findings with patient today.  Recommend excision with specimen sent to pathology, of the distal/dorsal exostosis at the right hallux.  This would be achieved via a fishmouth incision at the distal aspect of the toe, beyond the hyponychium.  Dissection would be carried down to the level of the tip of the distal phalanx in which the prominent bone would be excised and smoothed/remodeled.  Shall have stitches in for 2 to 3 weeks, so she will need to wear surgical shoe during that time.  She can bear weight directly on the foot and does not need any assistive devices unless she has balance issues.  She  would need to rest and elevate the foot to limit swelling and dehiscence.  Encouraged her to keep her blood sugar under good control during the perioperative period  When the patient follows up with her primary care physician preoperatively, I would recommend a CMP, CBC with differential, and HbA1c level.  Her primary care provider can also review her current medications and provide instructions for her on how to take them during the perioperative period.  This procedure will take approximately 30 minutes under intravenous sedation with local anesthesia and she will be given preoperative IV and postoperative oral antibiotics.  Benefits, risks, and possible postoperative complications were discussed with the patient.  Also discussed possible sequela and continued pain if she does not proceed with surgical intervention.  The MRI and x-ray findings were reviewed again today.  Verbal and written consent were obtained.  Follow-up for surgery  Jannetta Massey DSABRA [place], DPM, FACFAS [facility]     [address] [place], [place] [address]                Office [phone]  Fax [phone]

## 2024-02-27 ENCOUNTER — Other Ambulatory Visit: Payer: Self-pay | Admitting: Neurology

## 2024-02-27 ENCOUNTER — Telehealth: Payer: Self-pay | Admitting: Podiatry

## 2024-02-27 NOTE — Telephone Encounter (Signed)
 Patient is requesting Dr. Loel schedule patient for labs at [facility] as per discussion with patient on last visit. Patient telephone number, [phone]

## 2024-02-28 ENCOUNTER — Other Ambulatory Visit

## 2024-02-28 ENCOUNTER — Other Ambulatory Visit: Payer: Self-pay | Admitting: Podiatry

## 2024-02-28 ENCOUNTER — Ambulatory Visit

## 2024-02-28 ENCOUNTER — Ambulatory Visit: Admitting: Oncology

## 2024-02-28 DIAGNOSIS — M898X9 Other specified disorders of bone, unspecified site: Secondary | ICD-10-CM

## 2024-02-28 DIAGNOSIS — E1169 Type 2 diabetes mellitus with other specified complication: Secondary | ICD-10-CM

## 2024-02-28 DIAGNOSIS — M898X7 Other specified disorders of bone, ankle and foot: Secondary | ICD-10-CM

## 2024-02-28 NOTE — Progress Notes (Signed)
 Patient requested that I order her labs for [facility].  As per my discussion with the patient in the office I had made recommendations to the primary care provider that should be seeing her preoperatively for the primary care provider to order these so that they can respond to any abnormalities prior to surgery.  I went ahead and ordered the CBC with differential, HbA1c, and CMP with GFR, but the patient does need to follow-up with her primary care provider to review these results and be advised prior to surgery if there is any necessary change in medication.  Patient also needs to stop the aspirin  7 days prior to surgery.

## 2024-03-04 ENCOUNTER — Ambulatory Visit: Payer: Self-pay | Attending: Nurse Practitioner

## 2024-03-04 DIAGNOSIS — M5459 Other low back pain: Secondary | ICD-10-CM | POA: Insufficient documentation

## 2024-03-04 DIAGNOSIS — R279 Unspecified lack of coordination: Secondary | ICD-10-CM | POA: Insufficient documentation

## 2024-03-04 DIAGNOSIS — G4733 Obstructive sleep apnea (adult) (pediatric): Secondary | ICD-10-CM | POA: Diagnosis not present

## 2024-03-04 DIAGNOSIS — R918 Other nonspecific abnormal finding of lung field: Secondary | ICD-10-CM | POA: Diagnosis not present

## 2024-03-04 DIAGNOSIS — M62838 Other muscle spasm: Secondary | ICD-10-CM | POA: Insufficient documentation

## 2024-03-04 DIAGNOSIS — J4540 Moderate persistent asthma, uncomplicated: Secondary | ICD-10-CM | POA: Diagnosis not present

## 2024-03-04 DIAGNOSIS — M25551 Pain in right hip: Secondary | ICD-10-CM | POA: Insufficient documentation

## 2024-03-04 DIAGNOSIS — Z8551 Personal history of malignant neoplasm of bladder: Secondary | ICD-10-CM | POA: Diagnosis not present

## 2024-03-04 DIAGNOSIS — M6281 Muscle weakness (generalized): Secondary | ICD-10-CM | POA: Diagnosis present

## 2024-03-04 DIAGNOSIS — R053 Chronic cough: Secondary | ICD-10-CM | POA: Diagnosis not present
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
## 2024-03-05 ENCOUNTER — Telehealth: Payer: Self-pay | Admitting: Podiatry

## 2024-03-05 NOTE — Telephone Encounter (Signed)
 Called and scheduled patients surgery for 03/26/2024. Patient is on both blood thinners and a GLP1 medications. Have advised that they d/c use of both 7 days prior to surgery. Patients preferred pharmacy is set in chart. Made aware that [facility] will contact 24-48 hours prior to surgery with arrival time.

## 2024-03-06 ENCOUNTER — Telehealth: Payer: Self-pay | Admitting: Podiatry

## 2024-03-06 ENCOUNTER — Ambulatory Visit

## 2024-03-06 ENCOUNTER — Telehealth: Payer: Self-pay

## 2024-03-06 DIAGNOSIS — K649 Unspecified hemorrhoids: Secondary | ICD-10-CM | POA: Diagnosis not present

## 2024-03-06 DIAGNOSIS — Z8669 Personal history of other diseases of the nervous system and sense organs: Secondary | ICD-10-CM | POA: Diagnosis not present

## 2024-03-06 DIAGNOSIS — Z23 Encounter for immunization: Secondary | ICD-10-CM | POA: Diagnosis not present

## 2024-03-06 DIAGNOSIS — I2510 Atherosclerotic heart disease of native coronary artery without angina pectoris: Secondary | ICD-10-CM | POA: Diagnosis not present

## 2024-03-06 DIAGNOSIS — E1140 Type 2 diabetes mellitus with diabetic neuropathy, unspecified: Secondary | ICD-10-CM | POA: Diagnosis not present

## 2024-03-06 DIAGNOSIS — E785 Hyperlipidemia, unspecified: Secondary | ICD-10-CM | POA: Diagnosis not present

## 2024-03-06 DIAGNOSIS — Z79899 Other long term (current) drug therapy: Secondary | ICD-10-CM | POA: Diagnosis not present

## 2024-03-06 DIAGNOSIS — J301 Allergic rhinitis due to pollen: Secondary | ICD-10-CM | POA: Diagnosis not present

## 2024-03-06 DIAGNOSIS — K219 Gastro-esophageal reflux disease without esophagitis: Secondary | ICD-10-CM | POA: Diagnosis not present

## 2024-03-06 DIAGNOSIS — I10 Essential (primary) hypertension: Secondary | ICD-10-CM | POA: Diagnosis not present

## 2024-03-06 NOTE — Telephone Encounter (Signed)
 PT called and left voicemail for patient due to no-show appt.

## 2024-03-06 NOTE — Telephone Encounter (Signed)
 DOS- 03/26/2024  1ST EXOSTECTOMY RT- 28108  [employer] EFFECTIVE DATE- 06/10/2023  DEDUCTIBLE- $257 REMAINING- $0 OOP- $9350 REMAINING- $6205.85 COINSURANCE- 20%  PER [employer] PORTAL, NO PRIOR AUTH IS REQUIRED FOR CPT CODE 71891. DECISION ID# [id number]

## 2024-03-14 ENCOUNTER — Encounter: Payer: Self-pay | Admitting: Physical Therapy

## 2024-03-14 ENCOUNTER — Ambulatory Visit: Attending: Nurse Practitioner | Admitting: Physical Therapy

## 2024-03-14 DIAGNOSIS — M6281 Muscle weakness (generalized): Secondary | ICD-10-CM | POA: Diagnosis present

## 2024-03-14 DIAGNOSIS — M5459 Other low back pain: Secondary | ICD-10-CM | POA: Insufficient documentation

## 2024-03-14 DIAGNOSIS — M25551 Pain in right hip: Secondary | ICD-10-CM | POA: Insufficient documentation

## 2024-03-14 DIAGNOSIS — R279 Unspecified lack of coordination: Secondary | ICD-10-CM | POA: Diagnosis present
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
## 2024-03-19 ENCOUNTER — Telehealth: Payer: Self-pay | Admitting: Neurology

## 2024-03-19 NOTE — Telephone Encounter (Signed)
 LMOVM for patient to make sure patient increase to the 2 cap daily.

## 2024-03-19 NOTE — Telephone Encounter (Signed)
 Per Patient she do not need a refill, she wanted to know if we sent refills in at her visit. She never was able to start the medication due her medication is package when she get it and it was never added to her packaged mediations the Lexapro  was.     Advised patient when she do start or put the right medication with her package medication and remove the Lexapro  please give the office a call to advise Dr.Jaffe an update. Then we will refill the Duloxetine.

## 2024-03-19 NOTE — Telephone Encounter (Signed)
 Pt wants script sent for Rx- DULoxetine (CYMBALTA) 30 MG capsule with [facility]

## 2024-03-21 ENCOUNTER — Ambulatory Visit

## 2024-03-22 ENCOUNTER — Observation Stay (HOSPITAL_COMMUNITY)
Admission: EM | Admit: 2024-03-22 | Discharge: 2024-03-24 | Disposition: A | Discharge status: Home / Self Care | Source: Other Acute Inpatient Hospital | Attending: Family Medicine | Admitting: Family Medicine

## 2024-03-22 ENCOUNTER — Telehealth: Payer: Self-pay | Admitting: Family Medicine

## 2024-03-22 ENCOUNTER — Encounter (HOSPITAL_COMMUNITY): Payer: Self-pay

## 2024-03-22 DIAGNOSIS — R269 Unspecified abnormalities of gait and mobility: Principal | ICD-10-CM | POA: Insufficient documentation

## 2024-03-22 DIAGNOSIS — R42 Dizziness and giddiness: Principal | ICD-10-CM

## 2024-03-22 DIAGNOSIS — Z7982 Long term (current) use of aspirin: Secondary | ICD-10-CM | POA: Insufficient documentation

## 2024-03-22 DIAGNOSIS — Z87891 Personal history of nicotine dependence: Secondary | ICD-10-CM | POA: Insufficient documentation

## 2024-03-22 DIAGNOSIS — I10 Essential (primary) hypertension: Secondary | ICD-10-CM | POA: Diagnosis not present

## 2024-03-22 DIAGNOSIS — R7989 Other specified abnormal findings of blood chemistry: Secondary | ICD-10-CM | POA: Insufficient documentation

## 2024-03-22 DIAGNOSIS — R079 Chest pain, unspecified: Secondary | ICD-10-CM | POA: Insufficient documentation

## 2024-03-22 DIAGNOSIS — R202 Paresthesia of skin: Secondary | ICD-10-CM | POA: Diagnosis not present

## 2024-03-22 DIAGNOSIS — E119 Type 2 diabetes mellitus without complications: Secondary | ICD-10-CM | POA: Diagnosis not present

## 2024-03-22 DIAGNOSIS — E782 Mixed hyperlipidemia: Secondary | ICD-10-CM

## 2024-03-22 DIAGNOSIS — Z79899 Other long term (current) drug therapy: Secondary | ICD-10-CM | POA: Diagnosis not present

## 2024-03-22 DIAGNOSIS — E785 Hyperlipidemia, unspecified: Secondary | ICD-10-CM | POA: Diagnosis not present

## 2024-03-22 DIAGNOSIS — K219 Gastro-esophageal reflux disease without esophagitis: Secondary | ICD-10-CM | POA: Diagnosis not present

## 2024-03-22 DIAGNOSIS — I2510 Atherosclerotic heart disease of native coronary artery without angina pectoris: Secondary | ICD-10-CM | POA: Insufficient documentation

## 2024-03-22 DIAGNOSIS — I6523 Occlusion and stenosis of bilateral carotid arteries: Principal | ICD-10-CM | POA: Diagnosis present

## 2024-03-22 DIAGNOSIS — Z8709 Personal history of other diseases of the respiratory system: Secondary | ICD-10-CM

## 2024-03-22 DIAGNOSIS — J449 Chronic obstructive pulmonary disease, unspecified: Secondary | ICD-10-CM | POA: Diagnosis not present

## 2024-03-22 DIAGNOSIS — Z8679 Personal history of other diseases of the circulatory system: Secondary | ICD-10-CM

## 2024-03-22 DIAGNOSIS — Z8589 Personal history of malignant neoplasm of other organs and systems: Secondary | ICD-10-CM | POA: Diagnosis not present

## 2024-03-22 MED ORDER — ACETAMINOPHEN 325 MG PO TABS
650.0000 mg | ORAL_TABLET | Freq: Four times a day (QID) | ORAL | Status: DC | PRN
  Administered 2024-03-23 – 2024-03-24 (×2): 650 mg via ORAL
  Filled 2024-03-22 (×2): qty 2

## 2024-03-22 MED ORDER — ACETAMINOPHEN 650 MG RE SUPP: 650.0000 mg | Freq: Four times a day (QID) | RECTAL | Status: DC | PRN

## 2024-03-22 MED ORDER — ONDANSETRON HCL 4 MG/2ML IJ SOLN: 4.0000 mg | Freq: Four times a day (QID) | INTRAMUSCULAR | Status: DC | PRN

## 2024-03-22 MED ORDER — MELATONIN 3 MG PO TABS
3.0000 mg | ORAL_TABLET | Freq: Every evening | ORAL | Status: DC | PRN
  Administered 2024-03-23: 3 mg via ORAL
  Filled 2024-03-22: qty 1

## 2024-03-22 MED ORDER — INSULIN ASPART 100 UNIT/ML IJ SOLN
0.0000 [IU] | Freq: Three times a day (TID) | INTRAMUSCULAR | Status: DC
  Administered 2024-03-23: 2 [IU] via SUBCUTANEOUS
  Administered 2024-03-24: 1 [IU] via SUBCUTANEOUS
  Filled 2024-03-22: qty 1
  Filled 2024-03-22: qty 2

## 2024-03-22 NOTE — H&P (Signed)
 History and Physical      Deborah Hutchinson FMW:[id number] DOB: 1946-04-18 DOA: 03/22/2024; DOS: 03/22/2024  PCP: Gable Cambric, MD  Patient coming from: home   I have personally briefly reviewed patient's old medical records in [facility]  Chief Complaint: vertigo  HPI: Deborah Hutchinson is a 78 y.o. female with medical history significant for type 2 diabetes mellitus, COPD, essential hypertension, hyperlipidemia, who is admitted to [facility] on 03/22/2024 by way of transfer from [facility] emergency department with bilateral carotid artery stenosis after presenting from home to the latter facility complaining of vertigo.   The patient conveys that she has been experiencing intermittent vertigo over the last 2 years, gradually increasing in frequency over that timeframe.  She conveys that the vertigo is prompted and exacerbated by movements of the head, but is improving when she remains still.  In the setting of recent increase in frequency of her vertigo, she had presented to [facility] emergency department on 03/22/2024 for further evaluation and management of such.  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   Per my discussions with the patient this evening, denies any acute focal weakness nor any acute focal numbness, paresthesias, facial droop, acute change in vision, expressive aphasia, dysphagia.   At [facility] ED today, CT head was reported to show no evidence of acute intracranial process, including no evidence of intracranial hemorrhage or evidence of acute infarct.  CTA head and neck showed greater than 70% stenosis of the bilateral carotid arteries, without any evidence of associated aneurysm or dissection.  MRI of the brain showed no evidence of acute process, including no evidence of acute ischemic stroke.   EDP at [facility] discussed with on-call vascular surgery, Dr. Sheree, who conveyed that  vascular surgery will consult.   Her medical history is also notable for type 2 diabetes mellitus, essential pretension, hyperlipidemia.     Review of Systems: As per HPI otherwise 10 point review of systems negative.   Past Medical History:  Diagnosis Date   Aftercare following surgery 03/26/2020   Allergy 03/29/2021   Angina pectoris 06/17/2020   Anxiety    Arthritis    all over   Asthma    Follows w/ Dr. Marina, pulmonologist.   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   Chronic kidney disease    Chronic obstructive pulmonary disease (HCC) 02/09/2017   Formatting of this note might be different from the original. Last Assessment & Plan:  I will review her PFTs after I obtain records from her pulmonologist. Based on this we will decide if she needs inhalers, subjectively they do not seem to have helped her at all. She may benefit from a pulmonary rehabilitation program Formatting of this note might be different from the original. Last Assessment    COPD (chronic obstructive pulmonary disease) (HCC) 02/09/2017   Coronary artery disease involving native coronary artery of native heart without angina pectoris 06/10/2015   Depression    Diabetes mellitus due to underlying condition with unspecified complications (HCC) 06/10/2015   Dyslipidemia 06/10/2015   Esophageal dysphagia 09/02/2019   Formatting of this note might be different from the  original.  Added automatically from request for surgery [id number]     Essential hypertension 06/10/2015   Gastroparesis 09/04/2019   GERD (gastroesophageal reflux disease)    H/O hiatal hernia    Headache 09/24/2018   Heart murmur    Heme positive stool 10/06/2022   Hyperlipemia     Hypertension    Iron deficiency anemia 09/25/2020   Lumbar radicular pain 08/08/2022   Lumbar radiculopathy 12/18/2020   Mild aortic stenosis 05/16/2019   Mixed dyslipidemia 06/10/2015   Moderate aortic stenosis 05/16/2019   Nausea and vomiting 06/16/2020   occasional   Neurogenic claudication 12/18/2020   OSA (obstructive sleep apnea) 02/09/2017   Pain and swelling of toe of right foot 07/15/2016   Palpitations 06/02/2016   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 Mild pancolonic diverticulosis. Highly redundant colon.    DILATION AND CURETTAGE OF UTERUS     one   ENDOSCOPIC MUCOSAL RESECTION N/A 03/22/2021   Procedure: ENDOSCOPIC MUCOSAL RESECTION;  Surgeon: Wilhelmenia Aloha Raddle., MD;  Location: [facility];  Service: Gastroenterology;  Laterality: N/A;   ENTEROSCOPY N/A 10/06/2022   Procedure: ENTEROSCOPY;  Surgeon: Charlanne Groom, MD;  Location: [facility];  Service: Gastroenterology;  Laterality: N/A;   ESOPHAGOGASTRODUODENOSCOPY  08/17/2015   Moderate hiatal hernia. Otherwise noraml EGD.    ESOPHAGOGASTRODUODENOSCOPY  09/03/2019   [facility]   ESOPHAGOGASTRODUODENOSCOPY (EGD) WITH PROPOFOL  N/A 03/22/2021   Procedure: ESOPHAGOGASTRODUODENOSCOPY (EGD) WITH PROPOFOL ;  Surgeon: Wilhelmenia Aloha Raddle., MD;  Location: [facility];  Service: Gastroenterology;  Laterality: N/A;   EUS  05/19/2011   Procedure: UPPER ENDOSCOPIC ULTRASOUND (EUS) LINEAR;  Surgeon: Toribio Cedar, MD;  Location: [facility];  Service:  Endoscopy;  Laterality: N/A;  radial linear    GIVENS CAPSULE STUDY N/A 10/06/2022   Procedure: GIVENS CAPSULE STUDY;  Surgeon: Charlanne Groom, MD;  Location: [facility];  Service: Gastroenterology;  Laterality: N/A;   HEMOSTASIS CLIP PLACEMENT  03/22/2021   Procedure: HEMOSTASIS CLIP PLACEMENT;  Surgeon: Wilhelmenia Aloha Raddle., MD;  Location: [facility];  Service: Gastroenterology;;   HERNIA REPAIR  05/09/2020   HOT HEMOSTASIS N/A 10/06/2022   Procedure: HOT HEMOSTASIS (ARGON PLASMA COAGULATION/BICAP);  Surgeon: Charlanne Groom, MD;  Location: [facility];  Service: Gastroenterology;  Laterality: N/A;   HYSTEROTOMY     KNEE SURGERY     LAPAROSCOPIC PARAESOPHAGEAL HERNIA REPAIR  12/07/2019   LEFT HEART CATH AND CORONARY ANGIOGRAPHY     LEFT HEART CATH AND CORONARY ANGIOGRAPHY N/A 05/08/2019   Procedure: LEFT HEART CATH AND CORONARY ANGIOGRAPHY;  Surgeon: Verlin Lonni BIRCH, MD;  Location: [facility]AS[facility];  Service: Cardiovascular;  Laterality: N/A;   right thumb     tendon repair   RIGHT/LEFT HEART CATH AND CORONARY ANGIOGRAPHY N/A 06/23/2020   Procedure: RIGHT/LEFT HEART CATH AND CORONARY ANGIOGRAPHY;  Surgeon: Jordan, Peter M, MD;  Location: [facility] INVASIVE CV LAB;  Service: Cardiovascular;  Laterality: N/A;   SHOULDER SURGERY     rotator cuff repair   SUBMUCOSAL  LIFTING INJECTION  03/22/2021   Procedure: SUBMUCOSAL LIFTING INJECTION;  Surgeon: Wilhelmenia Aloha Raddle., MD;  Location: [facility];  Service: Gastroenterology;;   TRANSFORAMINAL LUMBAR INTERBODY FUSION L4-5   02/27/2020   WRIST SURGERY Right     Social History:  reports that she quit smoking about 12 years ago. Her smoking use included cigarettes. She has never used smokeless tobacco. She reports that she does not drink alcohol and does not use drugs.   Allergies  Allergen Reactions   Penicillins Rash    Reaction was 30years ago  Has never taken again   Ampicillin Hives   Gabapentin Other (See Comments)      Hallucinations     Family History  Problem Relation Age of Onset   Diabetes Sister    Cancer Sister    Hypertension Daughter    Colon cancer Neg Hx    Liver disease Neg Hx    Pancreatic cancer Neg Hx    Esophageal cancer Neg Hx     Family history reviewed and not pertinent    Prior to Admission medications   Medication Sig Start Date End Date Taking? Authorizing Provider  Accu-Chek FastClix Lancets MISC Apply 102 each topically 2 (two) times daily. 01/15/24   [provider]  ACCU-CHEK GUIDE TEST test strip 1 each by Other route as needed for other. 01/15/24   [provider]  albuterol  (PROVENTIL  HFA;VENTOLIN  HFA) 108 (90 Base) MCG/ACT inhaler Inhale 2 puffs into the lungs every 6 (six) hours as needed for wheezing or shortness of breath. 07/06/17   McQuaid, Douglas B, MD  albuterol  (PROVENTIL ) (2.5 MG/3ML) 0.083% nebulizer solution Take 2.5 mg by nebulization as needed for shortness of breath or wheezing. 04/14/23   [provider]  amLODipine  (NORVASC ) 10 MG tablet Take 10 mg by mouth daily. 01/02/23   [provider]  aspirin  EC 81 MG tablet Take 81 mg by mouth daily. Swallow whole.    [provider]  benzonatate (TESSALON) 200 MG capsule Take 200 mg by mouth at bedtime as needed for cough. 12/16/23   [provider]  buprenorphine (BUTRANS) 10 MCG/HR PTWK Place 1 patch onto the skin once a week.    [provider]  buPROPion  (WELLBUTRIN  XL) 300 MG 24 hr tablet Take 300 mg by mouth in the morning.    [provider]  CALCIUM  PO Take 1 tablet by mouth daily.    [provider]  donepezil (ARICEPT) 10 MG tablet Take 10 mg by mouth daily at 12 noon.    [provider]  DULoxetine (CYMBALTA) 30 MG capsule Take 1 capsule daily for 2 weeks, then increase to 2 capsules daily.  Contact me for refill. 02/20/24   Skeet Juliene SAUNDERS, DO  EPINEPHrine  0.3 mg/0.3 mL IJ SOAJ injection Inject 0.3 mg into the muscle as  needed for anaphylaxis. 02/04/21   [provider]  famotidine  (PEPCID ) 40 MG tablet Take 1 tablet (40 mg total) by mouth 2 (two) times daily. 09/11/23   Kennedy-Smith, Colleen M, NP  Ferrous Sulfate (IRON PO) Take 1 tablet by mouth in the morning.    [provider]  fluticasone  (FLONASE ) 50 MCG/ACT nasal spray Place 2 sprays into both nostrils daily.    [provider]  Fluticasone -Umeclidin-Vilant (TRELEGY ELLIPTA) 200-62.5-25 MCG/INH AEPB Inhale 2 puffs into the lungs daily.    [provider]  hydrocortisone  (ANUCORT-HC ) 25 MG suppository INSERT RECTALLY AT BEDTIME FOR 5 NIGHTS 10/09/23   Kennedy-Smith, Colleen M,  NP  hydrocortisone -pramoxine (PROCTOFOAM-HC) rectal foam Place 1 applicator rectally 2 (two) times daily as needed for hemorrhoids or anal itching.    [provider]  isosorbide  mononitrate (IMDUR ) 120 MG 24 hr tablet Take 1 tablet by mouth once daily 08/26/20   Revankar, Rajan R, MD  levocetirizine (XYZAL ) 5 MG tablet TAKE 1 TABLET BY MOUTH ONCE DAILY IN THE EVENING 10/11/21   Jeneal Danita Macintosh, MD  linaclotide  (LINZESS ) 72 MCG capsule Take 72 mcg by mouth as needed (constipation).    [provider]  methocarbamol (ROBAXIN) 500 MG tablet Take 500 mg by mouth 3 (three) times daily. Patient taking differently: Take 500 mg by mouth as needed. 01/15/24   [provider]  montelukast  (SINGULAIR ) 10 MG tablet Take 10 mg by mouth at bedtime.     [provider]  Multiple Vitamin (MULTIVITAMIN) capsule Take 1 capsule by mouth in the morning.  Women's Multivitamin Gummy    [provider]  nitroGLYCERIN  (NITROSTAT ) 0.4 MG SL tablet DISSOLVE 1 TABLET UNDER THE TONGUE AS NEEDED FOR CHEST PAIN EVERY 5 MINUTES UP TO 3 TIMES. IF NO RELIEF CALL 911. *NEW PRESCRIPTION REQUEST* Patient taking differently: Place 0.4 mg under the tongue every 5 (five) minutes as needed. 06/08/23   Revankar, Rajan R, MD  nystatin cream  (MYCOSTATIN) Apply 1 Application topically 2 (two) times daily as needed (skin irritation/rash).    [provider]  oxybutynin  (DITROPAN ) 5 MG tablet Take 1 tablet (5 mg total) by mouth every 8 (eight) hours as needed for bladder spasms. 02/01/23   Devere Lonni Righter, MD  OZEMPIC, 1 MG/DOSE, 4 MG/3ML SOPN Inject 1 mg into the skin once a week. 12/14/23   [provider]  pantoprazole  (PROTONIX ) 40 MG tablet Take 1 tablet (40 mg total) by mouth daily. 09/08/23   Charlanne Groom, MD  rosuvastatin  (CRESTOR ) 20 MG tablet Take 1 tablet (20 mg total) by mouth daily. 09/16/22   Revankar, Jennifer SAUNDERS, MD  traZODone (DESYREL) 50 MG tablet Take 50 mg by mouth at bedtime. 09/28/21   [provider]  vitamin B-12 (CYANOCOBALAMIN) 500 MCG tablet Take 500 mcg by mouth in the morning.    [provider]  Vitamin D , Ergocalciferol , (DRISDOL) 1.25 MG (50000 UNIT) CAPS capsule Take 50,000 Units by mouth every 30 (thirty) days. 12/30/23   [provider]     Objective    Physical Exam: Vitals:   03/22/24 2205  BP: (!) 148/81  Pulse: 62  Resp: 18  Temp: 97.9 F (36.6 C)  TempSrc: Oral  SpO2: 99%    General: appears to be stated age; alert, oriented Skin: warm, dry, no rash Head:  AT/[place] Mouth:  Oral mucosa membranes appear moist, normal dentition Neck: supple; trachea midline Heart:  RRR; did not appreciate any M/R/G Lungs: CTAB, did not appreciate any wheezes, rales, or rhonchi Abdomen: + BS; soft, ND, NT Vascular: 2+ pedal pulses b/l; 2+ radial pulses b/l Extremities: no peripheral edema, no muscle wasting       Labs on Admission: I have personally reviewed following labs and imaging studies  CBC: No results for input(s): WBC, NEUTROABS, HGB, HCT, MCV, PLT in the last 168 hours. Basic Metabolic Panel: No results for input(s): NA, K, CL, CO2, GLUCOSE, BUN, CREATININE, CALCIUM , MG, PHOS in the last 168 hours. GFR: CrCl  cannot be calculated (Patient's most recent lab result is older than the maximum 21 days allowed.). Liver Function Tests: No results for input(s): AST, ALT,  ALKPHOS, BILITOT, PROT, ALBUMIN in the last 168 hours. No results for input(s): LIPASE, AMYLASE in the last 168 hours. No results for input(s): AMMONIA in the last 168 hours. Coagulation Profile: No results for input(s): INR, PROTIME in the last 168 hours. Cardiac Enzymes: No results for input(s): CKTOTAL, CKMB, CKMBINDEX, TROPONINI in the last 168 hours. BNP (last 3 results) No results for input(s): PROBNP in the last 8760 hours. HbA1C: No results for input(s): HGBA1C in the last 72 hours. CBG: No results for input(s): GLUCAP in the last 168 hours. Lipid Profile: No results for input(s): CHOL, HDL, LDLCALC, TRIG, CHOLHDL, LDLDIRECT in the last 72 hours. Thyroid  Function Tests: No results for input(s): TSH, T4TOTAL, FREET4, T3FREE, THYROIDAB in the last 72 hours. Anemia Panel: No results for input(s): VITAMINB12, FOLATE, FERRITIN, TIBC, IRON, RETICCTPCT in the last 72 hours. Urine analysis: No results found for: COLORURINE, APPEARANCEUR, LABSPEC, PHURINE, GLUCOSEU, HGBUR, BILIRUBINUR, KETONESUR, PROTEINUR, UROBILINOGEN, NITRITE, LEUKOCYTESUR  Radiological Exams on Admission: No results found.    Assessment/Plan   Principal Problem:   Carotid artery stenosis, symptomatic, bilateral Active Problems:   GERD (gastroesophageal reflux disease)   HLD (hyperlipidemia)   Vertigo   DM2 (diabetes mellitus, type 2) (HCC)   History of COPD   History of essential hypertension      #) bilateral carotid artery stenosis: Today CTA head and neck performed at [facility] emergency department showed evidence of greater than 70% stenosis of the bilateral carotid arteries, without any evidence of associated dissection, aneurysm, or thrombus.   Potential notable finding in the context of the patient's report of 2 years of progressive vertigo, will noting that today's MRI brain showed no evidence of acute infarct.  EDP at [facility] discussed with on-call vascular surgery, Dr. Sheree, who conveyed that vascular surgery will consult.   Plan: Notified on-call vascular surgery of the patient's arrival at [facility].  Check hemoglobin A1c, lipid panel.  Resume home daily baby aspirin .  Check INR, PTT.  CBC in the morning.                     #) Vertigo: 2 years of progressive vertigo, which appears to worsen with movements of the head, improves when lying still , suggestive of benign paroxysmal positional vertigo.  However, there is also concern over potential contribution from CTA finding of greater than 70% stenosis of the bilateral carotid arteries, as above.  For now, in the setting of bilateral carotid artery stenosis, we will refrain from aggressive vestibular PT intervention, including Dix-Hallpike as well as Epley maneuver.  Does not appear to be associate with any acute focal neurologic deficits, and today's MRI brain showed no evidence of acute stroke.  Plan: Further evaluation management of presenting bilateral carotid artery stenosis, including vascular surgery consult, as above.  PT/OT consults ordered.  Fall precautions ordered.                        #) Type 2 Diabetes Mellitus: documented history of such.  On Ozempic as an outpatient, in the absence of any use of insulin  or oral hypoglycemic agents at home.  Most recent hemoglobin A1c was found to be 5.7% in September 2024.    Plan: accuchecks QAC and HS with low dose SSI.  Add on hemoglobin A1c level.  Hold home Ozempic during this hospitalization.                     #)  COPD: Documented history thereof, without clinical evidence of acute exacerbation at this time.  Outpatient respiratory regimen includes the following:  Trelegy, as needed albuterol , as well as Singulair .   Plan: cont outpatient Trelegy, Singulair . Prn albuterol  nebulizer. Check CMP and serum magnesium level in the AM.                         #) Essential Hypertension: documented h/o such, with outpatient antihypertensive regimen including Imdur , amlodipine .  SBP's at [facility] noted to be in the 130s mmHg. in the setting of presenting bilateral carotid artery stenosis, will be surveyed of with resumption of home hypertensive medications initially status to hypoperfusion as a result of sudden drop in systemic BP.   Plan: Close monitoring of subsequent BP via routine VS. hold next doses of amlodipine  and Imdur , as above.                         #) Hyperlipidemia: documented h/o such. On rosuvastatin  as outpatient.   Plan: Continue home statin.  Check lipid panel.                        #) GERD: documented h/o such; on Protonix  as outpatient.   Plan: continue home PPI.           DVT prophylaxis: SCD's   Code Status: Full code Family Communication: I discussed patient's case with the patient's daughter, who is present at bedside Disposition Plan: Per Rounding Team Consults called: EDP at [facility] discussed with on-call vascular surgery, Dr. Sheree, who conveyed that vascular surgery will consult. I've notified Dr. Sheree of the pt's arrival. ;  Admission status: obs     I SPENT GREATER THAN 75  MINUTES IN CLINICAL CARE TIME/MEDICAL DECISION-MAKING IN COMPLETING THIS ADMISSION.      Eva NOVAK Makaylynn Bonillas DO [facility]  From 7PM - 7AM   03/22/2024, 10:55 PM

## 2024-03-22 NOTE — Telephone Encounter (Signed)
 [facility] to [facility] Transfer Call  78 yo F with CAD, DM other medical issues presenting with vertigo symptoms for several months.  Seen by outpatient neurology in past.  Vertigo acutely worsened yesterday.  Bilateral tinnitus.  R facial numbness on exam.  CTA head/neck showed significant carotid artery disease (greater than 70% bilateral).  MRI is negative for acute stroke.  Tele neurology there said if evidence of CVA, discuss with vascular surgery.  Dr. Sheree agreed to see in consultation.   I clarified with Dr. Sheree who was ok with outpatient follow up in setting of MRI negative for stroke.      136/66, HR 64, 18, 98 RA Labs generally unremarkable   I discussed given patient without stroke, consider outpatient vascular follow up. EDP and hospitalist not comfortable admitting patient given her carotid disease.  Accepted to med tele.  Given her vertigo and right facial numbness, would discuss case with neurology.  Discuss with vascular on arrival.

## 2024-03-23 ENCOUNTER — Observation Stay (HOSPITAL_COMMUNITY)

## 2024-03-23 ENCOUNTER — Encounter (HOSPITAL_COMMUNITY): Payer: Self-pay | Admitting: Internal Medicine

## 2024-03-23 ENCOUNTER — Other Ambulatory Visit: Payer: Self-pay

## 2024-03-23 DIAGNOSIS — E119 Type 2 diabetes mellitus without complications: Secondary | ICD-10-CM

## 2024-03-23 DIAGNOSIS — Z8679 Personal history of other diseases of the circulatory system: Secondary | ICD-10-CM

## 2024-03-23 DIAGNOSIS — E785 Hyperlipidemia, unspecified: Secondary | ICD-10-CM | POA: Diagnosis not present

## 2024-03-23 DIAGNOSIS — J449 Chronic obstructive pulmonary disease, unspecified: Secondary | ICD-10-CM | POA: Diagnosis not present

## 2024-03-23 DIAGNOSIS — I6523 Occlusion and stenosis of bilateral carotid arteries: Secondary | ICD-10-CM

## 2024-03-23 DIAGNOSIS — R42 Dizziness and giddiness: Secondary | ICD-10-CM | POA: Diagnosis not present

## 2024-03-23 DIAGNOSIS — K219 Gastro-esophageal reflux disease without esophagitis: Secondary | ICD-10-CM | POA: Diagnosis not present

## 2024-03-23 DIAGNOSIS — Z8709 Personal history of other diseases of the respiratory system: Secondary | ICD-10-CM

## 2024-03-23 LAB — CBC WITH DIFFERENTIAL/PLATELET
Abs Immature Granulocytes: 0.01 K/uL (ref 0.00–0.07)
Basophils Absolute: 0 K/uL (ref 0.0–0.1)
Basophils Relative: 1 %
Eosinophils Absolute: 0.2 K/uL (ref 0.0–0.5)
Eosinophils Relative: 5 %
HCT: 36 % (ref 36.0–46.0)
Hemoglobin: 11.5 g/dL — ABNORMAL LOW (ref 12.0–15.0)
Immature Granulocytes: 0 %
Lymphocytes Relative: 33 %
Lymphs Abs: 1.5 K/uL (ref 0.7–4.0)
MCH: 28 pg (ref 26.0–34.0)
MCHC: 31.9 g/dL (ref 30.0–36.0)
MCV: 87.8 fL (ref 80.0–100.0)
Monocytes Absolute: 0.5 K/uL (ref 0.1–1.0)
Monocytes Relative: 12 %
Neutro Abs: 2.2 K/uL (ref 1.7–7.7)
Neutrophils Relative %: 49 %
Platelets: 271 K/uL (ref 150–400)
RBC: 4.1 MIL/uL (ref 3.87–5.11)
RDW: 14.7 % (ref 11.5–15.5)
WBC: 4.5 K/uL (ref 4.0–10.5)
nRBC: 0 % (ref 0.0–0.2)

## 2024-03-23 LAB — HEMOGLOBIN A1C
Hgb A1c MFr Bld: 5.4 % (ref 4.8–5.6)
Mean Plasma Glucose: 108.28 mg/dL

## 2024-03-23 LAB — COMPREHENSIVE METABOLIC PANEL WITH GFR
ALT: 25 U/L (ref 0–44)
AST: 31 U/L (ref 15–41)
Albumin: 3.2 g/dL — ABNORMAL LOW (ref 3.5–5.0)
Alkaline Phosphatase: 77 U/L (ref 38–126)
Anion gap: 9 (ref 5–15)
BUN: 8 mg/dL (ref 8–23)
CO2: 25 mmol/L (ref 22–32)
Calcium: 9 mg/dL (ref 8.9–10.3)
Chloride: 108 mmol/L (ref 98–111)
Creatinine, Ser: 0.93 mg/dL (ref 0.44–1.00)
GFR, Estimated: >60 mL/min (ref >60)
Glucose, Bld: 129 mg/dL — ABNORMAL HIGH (ref 70–99)
Potassium: 3 mmol/L — ABNORMAL LOW (ref 3.5–5.1)
Sodium: 142 mmol/L (ref 135–145)
Total Bilirubin: 0.6 mg/dL (ref 0.0–1.2)
Total Protein: 5.7 g/dL — ABNORMAL LOW (ref 6.5–8.1)

## 2024-03-23 LAB — APTT: aPTT: 34 s (ref 24–36)

## 2024-03-23 LAB — GLUCOSE, CAPILLARY
Glucose-Capillary: 106 mg/dL — ABNORMAL HIGH (ref 70–99)
Glucose-Capillary: 120 mg/dL — ABNORMAL HIGH (ref 70–99)
Glucose-Capillary: 134 mg/dL — ABNORMAL HIGH (ref 70–99)
Glucose-Capillary: 141 mg/dL — ABNORMAL HIGH (ref 70–99)
Glucose-Capillary: 157 mg/dL — ABNORMAL HIGH (ref 70–99)
Glucose-Capillary: 93 mg/dL (ref 70–99)

## 2024-03-23 LAB — LIPID PANEL
Cholesterol: 143 mg/dL (ref 0–200)
HDL: 74 mg/dL (ref >40)
LDL Cholesterol: 43 mg/dL (ref 0–99)
Total CHOL/HDL Ratio: 1.9 ratio
Triglycerides: 128 mg/dL (ref <150)
VLDL: 26 mg/dL (ref 0–40)

## 2024-03-23 LAB — MAGNESIUM: Magnesium: 1.9 mg/dL (ref 1.7–2.4)

## 2024-03-23 LAB — TSH: TSH: 0.726 u[IU]/mL (ref 0.350–4.500)

## 2024-03-23 LAB — PROTIME-INR
INR: 0.9 (ref 0.8–1.2)
Prothrombin Time: 12.9 s (ref 11.4–15.2)

## 2024-03-23 LAB — CORTISOL: Cortisol, Plasma: 6.9 ug/dL

## 2024-03-23 MED ORDER — ALBUTEROL SULFATE (2.5 MG/3ML) 0.083% IN NEBU: 2.5000 mg | INHALATION_SOLUTION | RESPIRATORY_TRACT | Status: DC | PRN

## 2024-03-23 MED ORDER — OXYCODONE HCL 5 MG PO TABS: 2.5000 mg | ORAL_TABLET | ORAL | Status: DC | PRN

## 2024-03-23 MED ORDER — PANTOPRAZOLE SODIUM 40 MG PO TBEC
40.0000 mg | DELAYED_RELEASE_TABLET | Freq: Every day | ORAL | Status: DC
  Administered 2024-03-23 – 2024-03-24 (×2): 40 mg via ORAL
  Filled 2024-03-23 (×2): qty 1

## 2024-03-23 MED ORDER — COSYNTROPIN 0.25 MG IJ SOLR
0.2500 mg | Freq: Once | INTRAMUSCULAR | Status: AC
  Administered 2024-03-24: 0.25 mg via INTRAVENOUS
  Filled 2024-03-23: qty 0.25

## 2024-03-23 MED ORDER — FAMOTIDINE 20 MG PO TABS
10.0000 mg | ORAL_TABLET | Freq: Once | ORAL | Status: AC
  Administered 2024-03-23: 10 mg via ORAL
  Filled 2024-03-23: qty 1

## 2024-03-23 MED ORDER — MONTELUKAST SODIUM 10 MG PO TABS
10.0000 mg | ORAL_TABLET | Freq: Every day | ORAL | Status: DC
  Administered 2024-03-23: 10 mg via ORAL
  Filled 2024-03-23: qty 1

## 2024-03-23 MED ORDER — ASPIRIN 81 MG PO TBEC
81.0000 mg | DELAYED_RELEASE_TABLET | Freq: Every day | ORAL | Status: DC
  Administered 2024-03-23 – 2024-03-24 (×2): 81 mg via ORAL
  Filled 2024-03-23 (×2): qty 1

## 2024-03-23 MED ORDER — ROSUVASTATIN CALCIUM 20 MG PO TABS
20.0000 mg | ORAL_TABLET | Freq: Every day | ORAL | Status: DC
  Administered 2024-03-23 – 2024-03-24 (×2): 20 mg via ORAL
  Filled 2024-03-23 (×2): qty 1

## 2024-03-23 MED ORDER — BUDESON-GLYCOPYRROL-FORMOTEROL 160-9-4.8 MCG/ACT IN AERO
2.0000 | INHALATION_SPRAY | Freq: Two times a day (BID) | RESPIRATORY_TRACT | Status: DC
  Administered 2024-03-23 – 2024-03-24 (×3): 2 via RESPIRATORY_TRACT
  Filled 2024-03-23 (×2): qty 5.9

## 2024-03-23 MED ORDER — NITROGLYCERIN 0.4 MG SL SUBL
0.4000 mg | SUBLINGUAL_TABLET | SUBLINGUAL | Status: DC | PRN
  Administered 2024-03-23: 0.4 mg via SUBLINGUAL
  Filled 2024-03-23: qty 1

## 2024-03-23 NOTE — Plan of Care (Signed)
  Problem: Activity: Goal: Risk for activity intolerance will decrease Outcome: Progressing   Problem: Pain Managment: Goal: General experience of comfort will improve and/or be controlled Outcome: Progressing   Problem: Safety: Goal: Ability to remain free from injury will improve Outcome: Progressing   Problem: Skin Integrity: Goal: Risk for impaired skin integrity will decrease Outcome: Progressing   Problem: Skin Integrity: Goal: Risk for impaired skin integrity will decrease Outcome: Progressing   Problem: Tissue Perfusion: Goal: Adequacy of tissue perfusion will improve Outcome: Progressing

## 2024-03-23 NOTE — Consult Note (Addendum)
 Hospital Consult    Reason for Consult:  Bilateral Carotid stenosis  Requesting Physician:  Dr. Perri MRN #:  [id number]  History of Present Illness: This is a 78 y.o. female with past medical history significant for COPD, Asthma, CAD, chronic back pain,  HTN, HLD, and Type II DM who was transferred from [facility] with progressive worsening dizziness. She explains that over the past two years she has had increasing frequency of dizzy and almost passing out events. This can occur on ambulation or rest. Often worse with movements of her head. She also has a lot of issues with her balance and frequent falls as a result of the dizziness and being off balance. She additionally reports right parietal headache and scalp tenderness. She says yesterday her dizziness was much worse then usual, which prompted her to go to the ED. She explains that she has seen Neurologists in the past for this as she also experiences some ringing in her ears. However,  she says their prior recommendations have not improved her symptoms. She denies any associated acute visual changes or loss of vision, no facial drooping, numbness of her upper or lower extremities, or slurred speech. At [facility] she had a CT head which showed no evidence of acute intracranial process, including no evidence of intracranial hemorrhage or evidence of acute infarct.  CTA head and neck showed greater than 70% stenosis of the bilateral carotid arteries, without any evidence of associated aneurysm or dissection.  MRI of the brain showed no evidence of acute process, including no evidence of acute ischemic stroke. Vascular surgery was consulted for evaluation of her carotid artery stenosis.   Past Medical History:  Diagnosis Date   Aftercare following surgery 03/26/2020   Allergy 03/29/2021   Angina pectoris 06/17/2020   Anxiety    Arthritis    all over   Asthma    Follows w/ Dr. Marina, pulmonologist.   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   Chronic kidney disease    Chronic obstructive pulmonary disease (HCC) 02/09/2017   Formatting of this note might be different from the original. Last Assessment & Plan:  I will review her PFTs after I obtain records from her pulmonologist. Based on this we will decide if she needs inhalers, subjectively they do not seem to have helped her at all. She may benefit from a pulmonary rehabilitation program Formatting of this note might be different from the original. Last Assessment    COPD (chronic obstructive pulmonary disease) (HCC) 02/09/2017   Coronary artery disease involving native coronary artery of native heart without angina pectoris 06/10/2015   Depression    Diabetes mellitus due to underlying condition with unspecified complications (HCC) 06/10/2015   Dyslipidemia 06/10/2015   Esophageal dysphagia 09/02/2019   Formatting of this note might be different from the original.  Added automatically from request for surgery [id number]     Essential hypertension 06/10/2015   Gastroparesis 09/04/2019   GERD (gastroesophageal reflux disease)    H/O hiatal hernia    Headache 09/24/2018   Heart murmur    Heme positive stool 10/06/2022   Hyperlipemia    Hypertension    Iron deficiency anemia 09/25/2020   Lumbar radicular  pain 08/08/2022   Lumbar radiculopathy 12/18/2020   Mild aortic stenosis 05/16/2019   Mixed dyslipidemia 06/10/2015   Moderate aortic stenosis 05/16/2019   Nausea and vomiting 06/16/2020   occasional   Neurogenic claudication 12/18/2020   OSA (obstructive sleep apnea) 02/09/2017   Pain and swelling of toe of right foot 07/15/2016   Palpitations 06/02/2016   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 Mild pancolonic diverticulosis. Highly redundant colon.    DILATION AND CURETTAGE OF UTERUS     one   ENDOSCOPIC MUCOSAL RESECTION N/A 03/22/2021   Procedure: ENDOSCOPIC MUCOSAL RESECTION;  Surgeon: Wilhelmenia Aloha Raddle., MD;  Location: [facility];  Service: Gastroenterology;  Laterality: N/A;   ENTEROSCOPY N/A 10/06/2022   Procedure: ENTEROSCOPY;  Surgeon: Charlanne Groom, MD;  Location: [facility];  Service: Gastroenterology;  Laterality: N/A;   ESOPHAGOGASTRODUODENOSCOPY  08/17/2015   Moderate hiatal hernia. Otherwise noraml EGD.    ESOPHAGOGASTRODUODENOSCOPY  09/03/2019   [facility]   ESOPHAGOGASTRODUODENOSCOPY (EGD) WITH PROPOFOL  N/A 03/22/2021   Procedure: ESOPHAGOGASTRODUODENOSCOPY (EGD) WITH PROPOFOL ;  Surgeon: Wilhelmenia Aloha Raddle., MD;  Location: [facility];  Service: Gastroenterology;  Laterality: N/A;   EUS  05/19/2011   Procedure: UPPER ENDOSCOPIC ULTRASOUND (EUS) LINEAR;  Surgeon: Toribio Cedar, MD;  Location: [facility];  Service: Endoscopy;  Laterality: N/A;  radial linear    GIVENS CAPSULE STUDY N/A 10/06/2022   Procedure: GIVENS CAPSULE STUDY;  Surgeon: Charlanne Groom, MD;  Location: [facility];  Service: Gastroenterology;  Laterality: N/A;   HEMOSTASIS CLIP PLACEMENT  03/22/2021   Procedure: HEMOSTASIS CLIP PLACEMENT;  Surgeon: Wilhelmenia Aloha Raddle., MD;  Location: [facility];  Service: Gastroenterology;;   HERNIA REPAIR  05/09/2020   HOT HEMOSTASIS N/A 10/06/2022   Procedure: HOT  HEMOSTASIS (ARGON PLASMA COAGULATION/BICAP);  Surgeon: Charlanne Groom, MD;  Location: [facility];  Service: Gastroenterology;  Laterality: N/A;   HYSTEROTOMY     KNEE SURGERY     LAPAROSCOPIC PARAESOPHAGEAL HERNIA REPAIR  12/07/2019   LEFT HEART CATH AND CORONARY ANGIOGRAPHY     LEFT HEART CATH AND CORONARY ANGIOGRAPHY N/A 05/08/2019   Procedure: LEFT HEART CATH AND CORONARY ANGIOGRAPHY;  Surgeon: Verlin Lonni BIRCH, MD;  Location: [facility];  Service: Cardiovascular;  Laterality: N/A;   right thumb     tendon repair   RIGHT/LEFT HEART CATH AND CORONARY ANGIOGRAPHY N/A 06/23/2020   Procedure: RIGHT/LEFT HEART CATH AND CORONARY ANGIOGRAPHY;  Surgeon: Jordan, Peter M, MD;  Location: [facility] CV LAB;  Service: Cardiovascular;  Laterality: N/A;   SHOULDER SURGERY     rotator cuff repair   SUBMUCOSAL LIFTING INJECTION  03/22/2021   Procedure: SUBMUCOSAL LIFTING INJECTION;  Surgeon: Wilhelmenia Aloha Raddle., MD;  Location: [facility];  Service: Gastroenterology;;   TRANSFORAMINAL LUMBAR INTERBODY FUSION L4-5   02/27/2020   WRIST SURGERY Right     Allergies  Allergen Reactions   Penicillins Rash    Reaction was 30years ago  Has never taken again   Ampicillin Hives  Gabapentin Other (See Comments)     Hallucinations     Prior to Admission medications   Medication Sig Start Date End Date Taking? Authorizing Provider  Accu-Chek FastClix Lancets MISC Apply 102 each topically 2 (two) times daily. 01/15/24   [provider]  ACCU-CHEK GUIDE TEST test strip 1 each by Other route as needed for other. 01/15/24   [provider]  albuterol  (PROVENTIL  HFA;VENTOLIN  HFA) 108 (90 Base) MCG/ACT inhaler Inhale 2 puffs into the lungs every 6 (six) hours as needed for wheezing or shortness of breath. 07/06/17   McQuaid, Douglas B, MD  albuterol  (PROVENTIL ) (2.5 MG/3ML) 0.083% nebulizer solution Take 2.5 mg by nebulization as needed for shortness of breath or wheezing.  04/14/23   [provider]  amLODipine  (NORVASC ) 10 MG tablet Take 10 mg by mouth daily. 01/02/23   [provider]  aspirin  EC 81 MG tablet Take 81 mg by mouth daily. Swallow whole.    [provider]  benzonatate (TESSALON) 200 MG capsule Take 200 mg by mouth at bedtime as needed for cough. 12/16/23   [provider]  buprenorphine (BUTRANS) 10 MCG/HR PTWK Place 1 patch onto the skin once a week.    [provider]  buPROPion  (WELLBUTRIN  XL) 300 MG 24 hr tablet Take 300 mg by mouth in the morning.    [provider]  CALCIUM  PO Take 1 tablet by mouth daily.    [provider]  donepezil (ARICEPT) 10 MG tablet Take 10 mg by mouth daily at 12 noon.    [provider]  DULoxetine (CYMBALTA) 30 MG capsule Take 1 capsule daily for 2 weeks, then increase to 2 capsules daily.  Contact me for refill. 02/20/24   Skeet, Adam R, DO  EPINEPHrine  0.3 mg/0.3 mL IJ SOAJ injection Inject 0.3 mg into the muscle as needed for anaphylaxis. 02/04/21   [provider]  famotidine  (PEPCID ) 40 MG tablet Take 1 tablet (40 mg total) by mouth 2 (two) times daily. 09/11/23   Kennedy-Smith, Colleen M, NP  Ferrous Sulfate (IRON PO) Take 1 tablet by mouth in the morning.    [provider]  fluticasone  (FLONASE ) 50 MCG/ACT nasal spray Place 2 sprays into both nostrils daily.    [provider]  Fluticasone -Umeclidin-Vilant (TRELEGY ELLIPTA) 200-62.5-25 MCG/INH AEPB Inhale 2 puffs into the lungs daily.    [provider]  hydrocortisone  (ANUCORT-HC ) 25 MG suppository INSERT RECTALLY AT BEDTIME FOR 5 NIGHTS 10/09/23   Kennedy-Smith, Colleen M, NP  hydrocortisone -pramoxine (PROCTOFOAM-HC) rectal foam Place 1 applicator rectally 2 (two) times daily as needed for hemorrhoids or anal itching.    [provider]  isosorbide  mononitrate (IMDUR ) 120 MG 24 hr tablet Take 1 tablet by mouth once daily 08/26/20   Revankar, Rajan R, MD   levocetirizine (XYZAL ) 5 MG tablet TAKE 1 TABLET BY MOUTH ONCE DAILY IN THE EVENING 10/11/21   Jeneal Danita Macintosh, MD  linaclotide  (LINZESS ) 72 MCG capsule Take 72 mcg by mouth as needed (constipation).    [provider]  methocarbamol (ROBAXIN) 500 MG tablet Take 500 mg by mouth 3 (three) times daily. Patient taking differently: Take 500 mg by mouth as needed. 01/15/24   [provider]  montelukast  (SINGULAIR ) 10 MG tablet Take 10 mg by mouth at bedtime.     [provider]  Multiple Vitamin (MULTIVITAMIN) capsule Take 1 capsule by mouth in the morning.  Women's Multivitamin Gummy    [provider]  nitroGLYCERIN  (  NITROSTAT ) 0.4 MG SL tablet DISSOLVE 1 TABLET UNDER THE TONGUE AS NEEDED FOR CHEST PAIN EVERY 5 MINUTES UP TO 3 TIMES. IF NO RELIEF CALL 911. *NEW PRESCRIPTION REQUEST* Patient taking differently: Place 0.4 mg under the tongue every 5 (five) minutes as needed. 06/08/23   Revankar, Rajan R, MD  nystatin cream (MYCOSTATIN) Apply 1 Application topically 2 (two) times daily as needed (skin irritation/rash).    [provider]  oxybutynin  (DITROPAN ) 5 MG tablet Take 1 tablet (5 mg total) by mouth every 8 (eight) hours as needed for bladder spasms. 02/01/23   Devere Lonni Righter, MD  OZEMPIC, 1 MG/DOSE, 4 MG/3ML SOPN Inject 1 mg into the skin once a week. 12/14/23   [provider]  pantoprazole  (PROTONIX ) 40 MG tablet Take 1 tablet (40 mg total) by mouth daily. 09/08/23   Charlanne Groom, MD  rosuvastatin  (CRESTOR ) 20 MG tablet Take 1 tablet (20 mg total) by mouth daily. 09/16/22   Revankar, Jennifer SAUNDERS, MD  traZODone (DESYREL) 50 MG tablet Take 50 mg by mouth at bedtime. 09/28/21   [provider]  vitamin B-12 (CYANOCOBALAMIN) 500 MCG tablet Take 500 mcg by mouth in the morning.    [provider]  Vitamin D , Ergocalciferol , (DRISDOL) 1.25 MG (50000 UNIT) CAPS capsule Take 50,000 Units by mouth every 30 (thirty) days.  12/30/23   [provider]    Social History   Socioeconomic History   Marital status: Single    Spouse name: Not on file   Number of children: 3   Years of education: Not on file   Highest education level: Not on file  Occupational History   Occupation: retired  Tobacco Use   Smoking status: Former    Current packs/day: 0.00    Types: Cigarettes    Quit date: 05/17/2011    Years since quitting: 12.8   Smokeless tobacco: Never  Vaping Use   Vaping status: Never Used  Substance and Sexual Activity   Alcohol use: No   Drug use: No   Sexual activity: Not on file  Other Topics Concern   Not on file  Social History Narrative   Right handed   Social Drivers of Health   Financial Resource Strain: Not on file  Food Insecurity: Low Risk  (08/02/2023)   Received from [facility]   Hunger Vital Sign    Within the past 12 months, you worried that your food would run out before you got money to buy more: Never true    Within the past 12 months, the food you bought just didn't last and you didn't have money to get more. : Never true  Transportation Needs: No Transportation Needs (08/02/2023)   Received from [facility]    In the past 12 months, has lack of reliable transportation kept you from medical appointments, meetings, work or from getting things needed for daily living? : No  Physical Activity: Not on file  Stress: Not on file  Social Connections: Not on file  Intimate Partner Violence: Not on file     Family History  Problem Relation Age of Onset   Diabetes Sister    Cancer Sister    Hypertension Daughter    Colon cancer Neg Hx    Liver disease Neg Hx    Pancreatic cancer Neg Hx    Esophageal cancer Neg Hx     ROS: Otherwise negative unless mentioned in HPI  Physical Examination  Vitals:   03/22/24 2205  03/23/24 0511  BP: (!) 148/81 121/73  Pulse: 62 63  Resp: 18 16  Temp: 97.9 F (36.6 C) 98.6 F (37 C)  SpO2: 99% 97%    Body mass index is 26.1 kg/m.  General:  WDWN in NAD Gait: Not observed HENT: WNL, normocephalic Pulmonary: normal non-labored breathing Cardiac: regular Abdomen:  soft Extremities:  moving all extremities without deficits Musculoskeletal: no muscle wasting or atrophy  Neurologic: A&O X 3;  No focal weakness or paresthesias are detected; speech is fluent/normal Psychiatric:  The pt has Normal affect. Lymph:  Unremarkable  CBC    Component Value Date/Time   WBC 4.5 03/23/2024 0421   RBC 4.10 03/23/2024 0421   HGB 11.5 (L) 03/23/2024 0421   HGB 12.6 12/29/2023 1025   HGB 10.7 (L) 06/17/2020 0000   HCT 36.0 03/23/2024 0421   HCT 33.7 (L) 06/17/2020 0000   PLT 271 03/23/2024 0421   PLT 349 12/29/2023 1025   PLT 329 06/17/2020 0000   MCV 87.8 03/23/2024 0421   MCV 81 06/17/2020 0000   MCH 28.0 03/23/2024 0421   MCHC 31.9 03/23/2024 0421   RDW 14.7 03/23/2024 0421   RDW 15.3 06/17/2020 0000   LYMPHSABS 1.5 03/23/2024 0421   LYMPHSABS 2.5 09/18/2017 0000   MONOABS 0.5 03/23/2024 0421   EOSABS 0.2 03/23/2024 0421   EOSABS 0.2 09/18/2017 0000   BASOSABS 0.0 03/23/2024 0421   BASOSABS 0.0 09/18/2017 0000    BMET    Component Value Date/Time   NA 142 03/23/2024 0421   NA 144 12/23/2022 0923   K 3.0 (L) 03/23/2024 0421   CL 108 03/23/2024 0421   CO2 25 03/23/2024 0421   GLUCOSE 129 (H) 03/23/2024 0421   BUN 8 03/23/2024 0421   BUN 13 12/23/2022 0923   CREATININE 0.93 03/23/2024 0421   CREATININE 1.05 (H) 12/29/2023 1025   CREATININE 0.80 02/02/2018 1456   CALCIUM  9.0 03/23/2024 0421   GFRNONAA >60 03/23/2024 0421   GFRNONAA 53 (L) 12/29/2023 1025   GFRAA 55 (L) 06/17/2020 0000    COAGS: Lab Results  Component Value Date   INR 0.9 03/23/2024   INR 0.9 03/25/2021     Non-Invasive Vascular Imaging:   CT Angio Head and Neck: 03/22/24 IMPRESSION: Negative for acute intracranial pathology. Severe stenosis of greater than 70% in both distal internal carotid  arteries as above. No large vessel occlusion.  Moderate severe stenosis at the origin of the right internal carotid. Severe greater than 70% stenosis at the origin of the left internal carotid artery.  Carotid duplex pending  Statin:  Yes.   Beta Blocker:  No. Aspirin :  Yes.   ACEI:  No. ARB:  No. CCB use:  Yes Other antiplatelets/anticoagulants:  No.    ASSESSMENT/PLAN: This is a 78 y.o. female past medical history significant for COPD,Asthma, CAD, chronic back pain, HTN, HLD, and Type II DM who was transferred from [facility] with progressive worsening dizziness, near syncopal episodes, headaches and balance issues. CTA head and neck at [facility] shows 70% bilateral ICA stenosis. No stenosis of her vertebral arteries on CTA. MRI shows no evidence of acute stroke. I have a low suspicion that her current symptoms are related to her bilateral carotid artery stenosis. She has no other neurological symptoms associated and her symptoms do not lateralize. I have ordered a carotid duplex for further evaluation. She is undergoing further workup for Vertigo. May be helpful to get Neurology consultation.  -  Continue Aspirin  and  statin  - Likely we will follow up with her outpatient to discuss elective asymptomatic carotid intervention in the future - The on call vascular surgeon, Dr. Sheree will see the patient later today to provide further management recommendations   Teretha Damme PA-C [facility] [phone] 03/23/2024  7:51 AM  I have independently interviewed and examined patient and agree with PA assessment and plan above.  CT reviewed and discussed with patient.  Will plan for carotid duplex to determine hemodynamic changes secondary to stenosis.  Does not appear these are symptomatic lesions.  Himani Corona C. Sheree, MD [facility] [place] Office: [phone] Pager: [phone]

## 2024-03-23 NOTE — Progress Notes (Addendum)
 PROGRESS NOTE    Deborah Hutchinson  FMW:[id number] DOB: 1946/02/10 DOA: 03/22/2024 PCP: Gable Cambric, MD  No chief complaint on file.   Brief Narrative:   Deborah Hutchinson is Deborah Hutchinson 78 y.o. female with medical history significant for type 2 diabetes mellitus, COPD, essential hypertension, hyperlipidemia, who is admitted to [facility] on 03/22/2024 by way of transfer from [facility] emergency department with bilateral carotid artery stenosis after presenting from home to the latter facility complaining of vertigo.   Assessment & Plan:   Principal Problem:   Carotid artery stenosis, symptomatic, bilateral Active Problems:   GERD (gastroesophageal reflux disease)   HLD (hyperlipidemia)   Vertigo   DM2 (diabetes mellitus, type 2) (HCC)   History of COPD   History of essential hypertension  Presyncope Lightheadedness She denies room spinning sensation or feeling like the floor is moving, rather she notes chronic lightheadedness/presyncope symptoms (feeling like she's going to faint).  Never loses consciousness.  2 years.  Sudden worsening 11/14.  Typically occurs when she's sitting (occurred when she was lying flat on 11/14). CTA at [facility] showed bilateral severe carotid artery stenosis MRI was negative for stroke She doesn't describe vertigo - I clarified multiple times, no room spinning, floor moving - her symptoms described as feeling as if she's going to faint.   Ddx for presyncope/lightheadedness is broad.  Not she has hx orthostatic hypotension in one of her neurology notes. Check orthostatics TSH wnl Cortisol low normal, will follow ACTH stim Follow echo  EKG Currently in sinus on tele, will plan for event monitor on discharge - symptoms not related to change in position and occurring while seated concerning for arrhythmia.    Right Sided Facial Numbness MRI was negative for stroke - negative for acute intracranial pathology, age appropriate atrophy and small  vessel disease Seen by tele neuro at [facility], recommended MRI (as above) and evaluation of stroke risk factors, recommended discussing with vascular if stroke on MRI Recommended continued aspirin , statin In setting of isolated facial sx, no additional w/u recommended - agreed with w/u of LH as above  Bilateral Carotid Artery Stenosis CTA head/neck negative for acute intracranial pathology, severe stenosis of greater than 70% in both distal internal carotid arteries, no LVO.  Moderate severe stenosis at the origin of the R internal carotid.  Severe greater than 70% stenosis at the origin of the left ICA. Carotid US  pending Vascular planning for outpatient follow up - low suspicion her symptoms related to CATODI stenosis  Gait Abnormality Chronic R hip pain - seems to have issues with gait related to this Therapy  Chronic Headache Saw neurology outpatient who had concern for possible post traumatic HA Continue follow up with neurology outpatient Try to limit nitroglycerin   Chest Pain CAD Chronic, she uses SL nitroglycerin  almost daily for Deborah Hutchinson year Troponin at OSH negative Follow echo  Coronary CTA nonobstructive in 2024 Of note, she was supposed to be on isosorbide  120 mg daily it appears, currently on hold Continue aspirin , crestor   COPD No si/sx exacerbation  T2DM A1c 5.4 SSI  HTN Apparently on amlodipine  at home - hold in setting of above Also on imdur  at home - this is currently on hold  Dyslipidemia Crestor   Med rec hasn't been done - will follow this up     DVT prophylaxis: SCD Code Status: full Family Communication: daughter at bedside Disposition:   Status is: Observation The patient remains OBS appropriate and will d/c before 2 midnights.   Consultants:  vascular  Procedures:  none  Antimicrobials:  Anti-infectives (From admission, onward)    None       Subjective: No new complaints  Objective: Vitals:   03/23/24 0511 03/23/24 0756  03/23/24 1146 03/23/24 1236  BP: 121/73 (!) 129/95 (!) 158/80   Pulse: 63 67 63   Resp: 16 18 18    Temp: 98.6 F (37 C) 97.7 F (36.5 C) 97.9 F (36.6 C)   TempSrc: Oral Oral Oral   SpO2: 97% 100% 100%   Weight: 73.3 kg     Height:    5' 5.5 (1.664 m)    Intake/Output Summary (Last 24 hours) at 03/23/2024 1457 Last data filed at 03/23/2024 0515 Gross per 24 hour  Intake 360 ml  Output --  Net 360 ml   Filed Weights   03/23/24 0511  Weight: 73.3 kg    Examination:  General exam: Appears calm and comfortable  Respiratory system: unlabored Cardiovascular system: RRR Gastrointestinal system: Abdomen is nondistended, soft and nontender. Central nervous system: Alert and oriented. CN 2-12 intact.  FNF intact.  RLE weakness due to chronic hip pain.  Symmetric strength otherwise. Extremities: no LEE   Data Reviewed: I have personally reviewed following labs and imaging studies  CBC: Recent Labs  Lab 03/23/24 0421  WBC 4.5  NEUTROABS 2.2  HGB 11.5*  HCT 36.0  MCV 87.8  PLT 271    Basic Metabolic Panel: Recent Labs  Lab 03/23/24 0421  NA 142  K 3.0*  CL 108  CO2 25  GLUCOSE 129*  BUN 8  CREATININE 0.93  CALCIUM  9.0  MG 1.9    GFR: Estimated Creatinine Clearance: 48.9 mL/min (by C-G formula based on SCr of 0.93 mg/dL).  Liver Function Tests: Recent Labs  Lab 03/23/24 0421  AST 31  ALT 25  ALKPHOS 77  BILITOT 0.6  PROT 5.7*  ALBUMIN 3.2*    CBG: Recent Labs  Lab 03/23/24 0002 03/23/24 0759 03/23/24 1143  GLUCAP 93 106* 120*     No results found for this or any previous visit (from the past 240 hours).       Radiology Studies: No results found.      Scheduled Meds:  aspirin  EC  81 mg Oral Daily   budesonide -glycopyrrolate-formoterol   2 puff Inhalation BID   [START ON 03/24/2024] cosyntropin  0.25 mg Intravenous Once   insulin  aspart  0-9 Units Subcutaneous TID WC   montelukast   10 mg Oral QHS   pantoprazole   40 mg Oral  Daily   rosuvastatin   20 mg Oral Daily   Continuous Infusions:   LOS: 1 day    Time spent: over 30 min     Meliton Monte, MD [facility]   To contact the attending provider between 7A-7P or the covering provider during after hours 7P-7A, please log into the web site [email or web address] and access using universal [place] password for that web site. If you do not have the password, please call the hospital operator.  03/23/2024, 2:57 PM

## 2024-03-23 NOTE — Care Management Obs Status (Signed)
 MEDICARE OBSERVATION STATUS NOTIFICATION   Patient Details  Name: Deborah Hutchinson MRN: [id number] Date of Birth: [birth year]   Medicare Observation Status Notification Given:  Yes    Makhi Muzquiz G., RN 03/23/2024, 9:20 AM

## 2024-03-23 NOTE — Progress Notes (Signed)
 Transported off unit at this time to Vascular Lab.  Transport method: Bed Escorted by: Risk Manager

## 2024-03-23 NOTE — Evaluation (Signed)
 Physical Therapy Evaluation Patient Details Name: Deborah Hutchinson MRN: [id number] DOB: Dec 19, 1945 Today's Date: 03/23/2024  History of Present Illness  Pt is an 78 y/o female admitted to [place] as transfer from [facility] ED with bil carotid stenosis with main complain of vertigo which has been chronic for 2 years, but has been increasing in frequency over that timeframe.  Todays CT / MRI showed no acute intracranial process/acute stroke.  PMHx: DM2, HLD, CAD, CA, Chronic pain/LBP, COPD  Clinical Impression  Pt admitted with/for vertigo, bil carotid stenosis and chronic pain/NM dysfunction.  Pt needing CG to light min assist today, but likely struggles to be safe at mod I with AD at home and is prone to falling.  Pt currently limited functionally due to the problems listed below.  (see problems list.)  Pt will benefit from PT to maximize function and safety to be able to get home safely with available assist.         If plan is discharge home, recommend the following: A little help with walking and/or transfers;A little help with bathing/dressing/bathroom;Assistance with cooking/housework;Assist for transportation   Can travel by private vehicle        Equipment Recommendations Rolling walker (2 wheels)  Recommendations for Other Services       Functional Status Assessment Patient has had a recent decline in their functional status and demonstrates the ability to make significant improvements in function in a reasonable and predictable amount of time.     Precautions / Restrictions Precautions Precautions: Fall Recall of Precautions/Restrictions: Intact Restrictions Weight Bearing Restrictions Per Provider Order: No      Mobility  Bed Mobility Overal bed mobility: Needs Assistance Bed Mobility: Supine to Sit, Sit to Supine     Supine to sit: Contact guard Sit to supine: Contact guard assist   General bed mobility comments: Overall an uncoordinated struggle to  come up/forward and pivot to EOB OOB  and reversed to get into bed.  Lots of flailing about propped on and pushing while propped on elbows.    Transfers Overall transfer level: Needs assistance Equipment used: Rolling walker (2 wheels) Transfers: Sit to/from Stand Sit to Stand: Min assist           General transfer comment: cues for hand placement and min stability assist for safety, though she probably looks equally unsafe at home.    Ambulation/Gait Ambulation/Gait assistance: Contact guard assist, Min assist Gait Distance (Feet): 140 Feet Assistive device: Rolling walker (2 wheels) Gait Pattern/deviations: Step-through pattern   Gait velocity interpretation: <1.8 ft/sec, indicate of risk for recurrent falls   General Gait Details: mildly uncoordinated gait pattern with associated incoordinated advancement of R LE and jerks/ mild collapses again R side pain in thigh or hip.  Frequent cues for posture, proximity to the RW, control during turns.   Plus, discuss that these deviation in gait are often caused by pains.  Stairs            Wheelchair Mobility     Tilt Bed    Modified Rankin (Stroke Patients Only)       Balance Overall balance assessment: Needs assistance Sitting-balance support: No upper extremity supported, Feet supported Sitting balance-Leahy Scale: Fair Sitting balance - Comments: Thou lists R over time needing use of R UE support.   Standing balance support: Bilateral upper extremity supported, No upper extremity supported Standing balance-Leahy Scale: Poor Standing balance comment: reliant  on uE's for stability  Pertinent Vitals/Pain Pain Assessment Pain Assessment: Faces Pain Score: 6  Pain Location: back and flank, leg R>L, Head ache , L>R neck Pain Descriptors / Indicators: Discomfort, Grimacing, Aching, Radiating    Home Living Family/patient expects to be discharged to:: Private  residence Living Arrangements: Alone Available Help at Discharge: Family;Available PRN/intermittently Type of Home: House Home Access: Stairs to enter   Entrance Stairs-Number of Steps: 1   Home Layout: One level Home Equipment: Other (comment);Rollator (4 wheels);Cane - single point;BSC/3in1 (power scooter)      Prior Function Prior Level of Function : Independent/Modified Independent;Needs assist       Physical Assist :  (Has a PCA 3 hours M-F that helps more with iADL's than ADL's)     Mobility Comments: Mod I with rollator, but falls fairly often per dtr, a power scooter in home is not very useful b/c it can go through doors or be transported by car. ADLs Comments: Toilet, takes a bath and sits to do her prep sitting at the sink all mod I level     Extremity/Trunk Assessment   Upper Extremity Assessment Upper Extremity Assessment: Generalized weakness    Lower Extremity Assessment Lower Extremity Assessment: Generalized weakness (L.>R Neuro-muscular dys with weakness incoordination and jerking/collapse with pain.)    Cervical / Trunk Assessment Cervical / Trunk Assessment:  (lateral tilt R and roltated R due to scm cording)  Communication   Communication Communication: No apparent difficulties    Cognition Arousal: Alert Behavior During Therapy: WFL for tasks assessed/performed   PT - Cognitive impairments: No apparent impairments (NT formally)                         Following commands: Intact       Cueing Cueing Techniques: Verbal cues     General Comments General comments (skin integrity, edema, etc.): Spent some time and education on moderating the spasms/pain at L SCM with deep pressure to fatigue of SCM trigger points, strumming of SCM and scalenes bil.    Exercises     Assessment/Plan    PT Assessment Patient needs continued PT services  PT Problem List Decreased strength;Decreased activity tolerance;Decreased balance;Decreased  mobility;Decreased coordination;Decreased knowledge of use of DME;Impaired tone;Pain       PT Treatment Interventions Gait training;Functional mobility training;Therapeutic activities;Balance training;Neuromuscular re-education;Patient/family education;Other (comment) (vertigo/vestibular treatment of hypofunction)    PT Goals (Current goals can be found in the Care Plan section)  Acute Rehab PT Goals Patient Stated Goal: Dtr wants safer independence.  work on neck and vertigo. PT Goal Formulation: With patient/family Time For Goal Achievement: 04/06/24 Potential to Achieve Goals: Fair    Frequency Min 3X/week     Co-evaluation               AM-PAC PT 6 Clicks Mobility  Outcome Measure Help needed turning from your back to your side while in a flat bed without using bedrails?: A Little Help needed moving from lying on your back to sitting on the side of a flat bed without using bedrails?: A Little Help needed moving to and from a bed to a chair (including a wheelchair)?: A Little Help needed standing up from a chair using your arms (e.g., wheelchair or bedside chair)?: A Little Help needed to walk in hospital room?: A Little Help needed climbing 3-5 steps with a railing? : A Lot 6 Click Score: 17    End of Session   Activity Tolerance: Patient tolerated  treatment well Patient left: in bed;with call bell/phone within reach   PT Visit Diagnosis: Other abnormalities of gait and mobility (R26.89);Pain;Other symptoms and signs involving the nervous system (R29.898) Pain - part of body:  (cervical, flank, LE's)    Time: 1100-1143 PT Time Calculation (min) (ACUTE ONLY): 43 min   Charges:   PT Evaluation $PT Eval Moderate Complexity: 1 Mod PT Treatments $Gait Training: 8-22 mins $Neuromuscular Re-education: 8-22 mins PT General Charges $$ ACUTE PT VISIT: 1 Visit         03/23/2024  India HERO., PT Acute Rehabilitation Services [phone]  (office)  Vinie GAILS  Briea Mcenery 03/23/2024, 4:06 PM

## 2024-03-23 NOTE — Progress Notes (Signed)
 VASCULAR LAB    Carotid duplex has been performed.  See CV proc for preliminary results.   Laken Lobato, RVT 03/23/2024, 2:52 PM

## 2024-03-24 ENCOUNTER — Observation Stay (HOSPITAL_COMMUNITY)

## 2024-03-24 DIAGNOSIS — I350 Nonrheumatic aortic (valve) stenosis: Secondary | ICD-10-CM | POA: Diagnosis not present

## 2024-03-24 DIAGNOSIS — I6523 Occlusion and stenosis of bilateral carotid arteries: Secondary | ICD-10-CM | POA: Diagnosis not present

## 2024-03-24 LAB — COMPREHENSIVE METABOLIC PANEL WITH GFR
ALT: 27 U/L (ref 0–44)
AST: 29 U/L (ref 15–41)
Albumin: 3.3 g/dL — ABNORMAL LOW (ref 3.5–5.0)
Alkaline Phosphatase: 84 U/L (ref 38–126)
Anion gap: 11 (ref 5–15)
BUN: 9 mg/dL (ref 8–23)
CO2: 24 mmol/L (ref 22–32)
Calcium: 9 mg/dL (ref 8.9–10.3)
Chloride: 107 mmol/L (ref 98–111)
Creatinine, Ser: 0.76 mg/dL (ref 0.44–1.00)
GFR, Estimated: >60 mL/min (ref >60)
Glucose, Bld: 97 mg/dL (ref 70–99)
Potassium: 3.3 mmol/L — ABNORMAL LOW (ref 3.5–5.1)
Sodium: 142 mmol/L (ref 135–145)
Total Bilirubin: 0.5 mg/dL (ref 0.0–1.2)
Total Protein: 6.1 g/dL — ABNORMAL LOW (ref 6.5–8.1)

## 2024-03-24 LAB — ECHOCARDIOGRAM COMPLETE
AR max vel: 1.3 cm2
AV Area VTI: 1.39 cm2
AV Area mean vel: 1.32 cm2
AV Mean grad: 17 mmHg
AV Peak grad: 31 mmHg
Ao pk vel: 2.79 m/s
Area-P 1/2: 2.27 cm2
Calc EF: 72.7 %
Height: 65.5 in
MV VTI: 2.02 cm2
S' Lateral: 2.3 cm
Single Plane A2C EF: 70.1 %
Single Plane A4C EF: 75.2 %
Weight: 2539.2 [oz_av]

## 2024-03-24 LAB — CBC WITH DIFFERENTIAL/PLATELET
Abs Immature Granulocytes: 0.01 K/uL (ref 0.00–0.07)
Basophils Absolute: 0 K/uL (ref 0.0–0.1)
Basophils Relative: 1 %
Eosinophils Absolute: 0.2 K/uL (ref 0.0–0.5)
Eosinophils Relative: 4 %
HCT: 40.1 % (ref 36.0–46.0)
Hemoglobin: 12.6 g/dL (ref 12.0–15.0)
Immature Granulocytes: 0 %
Lymphocytes Relative: 28 %
Lymphs Abs: 1.4 K/uL (ref 0.7–4.0)
MCH: 28.1 pg (ref 26.0–34.0)
MCHC: 31.4 g/dL (ref 30.0–36.0)
MCV: 89.3 fL (ref 80.0–100.0)
Monocytes Absolute: 0.5 K/uL (ref 0.1–1.0)
Monocytes Relative: 11 %
Neutro Abs: 2.8 K/uL (ref 1.7–7.7)
Neutrophils Relative %: 56 %
Platelets: 282 K/uL (ref 150–400)
RBC: 4.49 MIL/uL (ref 3.87–5.11)
RDW: 14.6 % (ref 11.5–15.5)
WBC: 5 K/uL (ref 4.0–10.5)
nRBC: 0 % (ref 0.0–0.2)

## 2024-03-24 LAB — GLUCOSE, CAPILLARY
Glucose-Capillary: 86 mg/dL (ref 70–99)
Glucose-Capillary: 124 mg/dL — ABNORMAL HIGH (ref 70–99)
Glucose-Capillary: 104 mg/dL — ABNORMAL HIGH (ref 70–99)

## 2024-03-24 LAB — ACTH STIMULATION, 3 TIME POINTS
Cortisol, 30 Min: 28 ug/dL
Cortisol, 60 Min: 33.4 ug/dL
Cortisol, Base: 12.1 ug/dL

## 2024-03-24 LAB — HEMOGLOBIN A1C
Hgb A1c MFr Bld: 5.5 % (ref 4.8–5.6)
Mean Plasma Glucose: 111.15 mg/dL

## 2024-03-24 LAB — PHOSPHORUS: Phosphorus: 3.6 mg/dL (ref 2.5–4.6)

## 2024-03-24 LAB — LIPID PANEL
Cholesterol: 149 mg/dL (ref 0–200)
HDL: 86 mg/dL (ref >40)
LDL Cholesterol: 53 mg/dL (ref 0–99)
Total CHOL/HDL Ratio: 1.7 ratio
Triglycerides: 52 mg/dL (ref <150)
VLDL: 10 mg/dL (ref 0–40)

## 2024-03-24 LAB — MAGNESIUM: Magnesium: 1.8 mg/dL (ref 1.7–2.4)

## 2024-03-24 MED ORDER — AMLODIPINE BESYLATE 10 MG PO TABS
10.0000 mg | ORAL_TABLET | Freq: Every day | ORAL | Status: DC
  Administered 2024-03-24: 10 mg via ORAL
  Filled 2024-03-24: qty 1

## 2024-03-24 MED ORDER — ISOSORBIDE MONONITRATE ER 60 MG PO TB24
120.0000 mg | ORAL_TABLET | Freq: Every day | ORAL | Status: DC
  Administered 2024-03-24: 120 mg via ORAL
  Filled 2024-03-24: qty 2

## 2024-03-24 MED ORDER — DONEPEZIL HCL 10 MG PO TABS
10.0000 mg | ORAL_TABLET | Freq: Every day | ORAL | Status: DC
  Administered 2024-03-24: 10 mg via ORAL
  Filled 2024-03-24: qty 1

## 2024-03-24 MED ORDER — EMPAGLIFLOZIN 10 MG PO TABS
10.0000 mg | ORAL_TABLET | Freq: Every day | ORAL | Status: DC
  Administered 2024-03-24: 10 mg via ORAL
  Filled 2024-03-24: qty 1

## 2024-03-24 MED ORDER — TRAZODONE HCL 100 MG PO TABS: 100.0000 mg | ORAL_TABLET | Freq: Every day | ORAL | Status: DC

## 2024-03-24 MED ORDER — BUPROPION HCL ER (XL) 150 MG PO TB24: 300.0000 mg | ORAL_TABLET | Freq: Every morning | ORAL | Status: DC

## 2024-03-24 MED ORDER — DULOXETINE HCL 30 MG PO CPEP
30.0000 mg | ORAL_CAPSULE | Freq: Every day | ORAL | Status: DC
  Administered 2024-03-24: 30 mg via ORAL
  Filled 2024-03-24: qty 1

## 2024-03-24 MED ORDER — LIDOCAINE 5 % EX PTCH
1.0000 | MEDICATED_PATCH | CUTANEOUS | Status: DC
  Administered 2024-03-24: 1 via TRANSDERMAL
  Filled 2024-03-24: qty 1

## 2024-03-24 NOTE — Progress Notes (Addendum)
  Progress Note    03/24/2024 8:45 AM * No surgery found *  Subjective:  complaining of headache this morning and pain in left neck area when she turns her head   Vitals:   03/24/24 0748 03/24/24 0752  BP: (!) 154/77   Pulse: 70 61  Resp: (!) 24   Temp: 98.5 F (36.9 C)   SpO2: 98% 98%   Physical Exam: Cardiac:  regular Lungs:  non labored Extremities:  moving all extremities without deficits Abdomen:  soft Neurologic: alert and oriented  CBC    Component Value Date/Time   WBC 5.0 03/24/2024 0535   RBC 4.49 03/24/2024 0535   HGB 12.6 03/24/2024 0535   HGB 12.6 12/29/2023 1025   HGB 10.7 (L) 06/17/2020 0000   HCT 40.1 03/24/2024 0535   HCT 33.7 (L) 06/17/2020 0000   PLT 282 03/24/2024 0535   PLT 349 12/29/2023 1025   PLT 329 06/17/2020 0000   MCV 89.3 03/24/2024 0535   MCV 81 06/17/2020 0000   MCH 28.1 03/24/2024 0535   MCHC 31.4 03/24/2024 0535   RDW 14.6 03/24/2024 0535   RDW 15.3 06/17/2020 0000   LYMPHSABS 1.4 03/24/2024 0535   LYMPHSABS 2.5 09/18/2017 0000   MONOABS 0.5 03/24/2024 0535   EOSABS 0.2 03/24/2024 0535   EOSABS 0.2 09/18/2017 0000   BASOSABS 0.0 03/24/2024 0535   BASOSABS 0.0 09/18/2017 0000    BMET    Component Value Date/Time   NA 142 03/24/2024 0535   NA 144 12/23/2022 0923   K 3.3 (L) 03/24/2024 0535   CL 107 03/24/2024 0535   CO2 24 03/24/2024 0535   GLUCOSE 97 03/24/2024 0535   BUN 9 03/24/2024 0535   BUN 13 12/23/2022 0923   CREATININE 0.76 03/24/2024 0535   CREATININE 1.05 (H) 12/29/2023 1025   CREATININE 0.80 02/02/2018 1456   CALCIUM  9.0 03/24/2024 0535   GFRNONAA >60 03/24/2024 0535   GFRNONAA 53 (L) 12/29/2023 1025   GFRAA 55 (L) 06/17/2020 0000    INR    Component Value Date/Time   INR 0.9 03/23/2024 0421    No intake or output data in the 24 hours ending 03/24/24 0845   Assessment/Plan:  78 y.o. female presenting with progressively worsening dizziness, headache, presyncopal episodes and headaches. MRI  negative for stroke. CTA head and neck showing bilateral 70% stenosis of distal ICA's. Do not feel symptoms are related to her carotid stenosis. Continued workup for underlying causes per primary team. Carotid duplex shows tortuous calcified bilateral ICA's with 1-39% stenosis. Unable to reproduce the CTA findings of 70%. Normal flow in the vertebral arteries. Recommend continued Aspirin  and Statin. Will arrange 3 month follow up with repeat carotid duplex in our office   Teretha Charlyne RIGGERS [facility] [phone] 03/24/2024 8:45 AM  I have independently interviewed and examined patient and agree with PA assessment and plan above.  CTA appears to be overestimated due to calcification and possible motion degradation.  Carotid duplex with mild stenosis bilaterally will have her follow-up in the office with repeat duplex.  Does not appear that her symptoms are related to carotid stenosis.  Damara Klunder C. Sheree, MD [facility] [place] Office: [phone] Pager: [phone]

## 2024-03-24 NOTE — Progress Notes (Signed)
 Contacted phlebotomy regarding ACTH stimulation 3 times points (baseline, 30 min, 60 min) blood draw at 0422 they informed me that morning draw and shift change would be happening soon so to call back at about 0530-0600 to set up time for draw with on coming shift. Contacted again at 0548 and was informed that they would not be able to set up a time for ACTH blood draw and that day nurse would need to call back to set up a time because phlebotomist have already started their morning blood draws.

## 2024-03-24 NOTE — Plan of Care (Signed)
  Problem: Education: Goal: Knowledge of General Education information will improve Description: Including pain rating scale, medication(s)/side effects and non-pharmacologic comfort measures Outcome: Progressing   Problem: Clinical Measurements: Goal: Will remain free from infection Outcome: Progressing   Problem: Activity: Goal: Risk for activity intolerance will decrease Outcome: Progressing   Problem: Elimination: Goal: Will not experience complications related to bowel motility Outcome: Progressing Goal: Will not experience complications related to urinary retention Outcome: Progressing   Problem: Pain Managment: Goal: General experience of comfort will improve and/or be controlled Outcome: Progressing   Problem: Safety: Goal: Ability to remain free from injury will improve Outcome: Progressing

## 2024-03-24 NOTE — Discharge Summary (Signed)
 Physician Discharge Summary  Deborah Hutchinson FMW:[id number] DOB: 1945/09/15 DOA: 03/22/2024  PCP: Gable Cambric, MD  Admit date: 03/22/2024 Discharge date: 03/24/2024  Time spent: 40 minutes  Recommendations for Outpatient Follow-up:  Follow outpatient CBC/CMP  Follow with cardiology outpatient - moderate aortic stenosis, ziopatch, daily nitroglycerin  use, lightheadedness Follow with neurology outpatient - chronic HA, chronic lightheadedness, transient facial numbness Outpatient therapy Follow with PCP for chronic pain  Review meds outpatient  Discharge Diagnoses:  Principal Problem:   Carotid artery stenosis, symptomatic, bilateral Active Problems:   GERD (gastroesophageal reflux disease)   HLD (hyperlipidemia)   Vertigo   DM2 (diabetes mellitus, type 2) (HCC)   History of COPD   History of essential hypertension   Discharge Condition: stable  Diet recommendation: heart healthy   Filed Weights   03/23/24 0511 03/24/24 0453  Weight: 73.3 kg 72 kg    History of present illness:    Deborah Hutchinson is Deborah Hutchinson 78 y.o. female with medical history significant for type 2 diabetes mellitus, COPD, essential hypertension, hyperlipidemia, who is admitted to [facility] on 03/22/2024 by way of transfer from [facility] emergency department with reported >70% bilateral carotid artery stenosis after presenting from home with reports of vertigo.   Symptoms described more consistent with presyncope and lightheadedness on discussion with me.  Her carotid imaging was repeated with US  and was 1-39% bilaterally.  Vascular planning to follow her carotid disease outpatient.   With regards to her presyncope, unclear what the cause is for her chronic symptoms.  She'll need to continue to follow this with her outpatient neurologist and cardiologist.  Has moderate AS which should be followed with cards outpatient.  Will arrange ziopatch.  Needs outpatient follow up with her primary  providers/prescribers.  Has many chronic issues, chronic HA, chronic neck pain, chronic chest pain, chronic lightheadedness.  Uses nitroglycerin  almost daily for chronic chest pain, instructed that she should follow up with cardiology to discuss this and try to reduce this if possible.   Hospital Course:  Assessment and Plan:  Presyncope Lightheadedness She denies room spinning sensation or feeling like the floor is moving, rather she notes chronic lightheadedness/presyncope symptoms (feeling like she's going to faint).  Never loses consciousness.  2 years.  Sudden worsening 11/14.  Typically occurs when she's sitting (occurred when she was lying flat on 11/14). CTA at [facility] showed bilateral severe carotid artery stenosis MRI was negative for stroke She doesn't describe vertigo - I clarified multiple times, no room spinning, floor moving - her symptoms described as feeling as if she's going to faint.   Ddx for presyncope/lightheadedness is broad.  Not she has hx orthostatic hypotension in one of her neurology notes. Check orthostatics - negative TSH wnl Normal ACTH stim Echo with moderate AS - follow with cards outpatient  Currently in sinus on tele, will plan for event monitor on discharge - symptoms not related to change in position and occurring while seated concerning for arrhythmia.  Message sent to card master for ziopatch at discharge. Continue to follow these chronic symptoms with cards and neurology outpatient   Right Sided Facial Numbness MRI was negative for stroke - negative for acute intracranial pathology, age appropriate atrophy and small vessel disease Seen by tele neuro at [facility], recommended MRI (as above) and evaluation of stroke risk factors, recommended discussing with vascular if stroke on MRI Recommended continued aspirin , statin Discussed with neurology, in setting of isolated facial sx, no additional w/u recommended - agreed with w/u  of LH as above   Bilateral  Carotid Artery Stenosis CTA head/neck negative for acute intracranial pathology, severe stenosis of greater than 70% in both distal internal carotid arteries, no LVO.  Moderate severe stenosis at the origin of the R internal carotid.  Severe greater than 70% stenosis at the origin of the left ICA. Carotid US  1-39% stenosis bilaterally - tortuous vessels Vascular planning for outpatient follow up - low suspicion her symptoms related to carotid stenosis   Gait Abnormality Chronic R hip pain - seems to have issues with gait related to this R knee with progressive 3 compartmental OA, small effusion, plain films of R hip without acute fx Outpatient Therapy   Chronic Headache Saw neurology outpatient who had concern for possible post traumatic HA Continue follow up with neurology outpatient Try to limit nitroglycerin    Chronic Chest Pain CAD Chronic, she uses SL nitroglycerin  almost daily for Deborah Hutchinson year - encouraged to follow with cardiology outpatient to discuss this further Troponin at OSH negative Echo without RWMA, grade II diastolic dysfunction - moderate AV stenosis Coronary CTA nonobstructive in 2024 imdur  Continue aspirin , crestor  Follow with cardiology outpatient   Chronic Neck Pain Symptomatic management - this is chronic for Deborah Hutchinson year or so Follow outpatient   COPD No si/sx exacerbation   T2DM A1c 5.4 SSI   HTN Amlodipine , imdur    Dyslipidemia Crestor     Procedures: Echo IMPRESSIONS     1. Left ventricular ejection fraction, by estimation, is 70 to 75%. The  left ventricle has hyperdynamic function. The left ventricle has no  regional wall motion abnormalities. There is mild asymmetric left  ventricular hypertrophy of the basal-septal  segment. Left ventricular diastolic parameters are consistent with Grade  II diastolic dysfunction (pseudonormalization). Elevated left atrial  pressure.   2. Right ventricular systolic function is normal. The right ventricular   size is normal. Tricuspid regurgitation signal is inadequate for assessing  PA pressure.   3. The mitral valve is degenerative. Trivial mitral valve regurgitation.  Moderate mitral annular calcification.   4. The aortic valve is tricuspid. There is moderate calcification of the  aortic valve. Aortic valve regurgitation is trivial. Moderate aortic valve  stenosis. Vmax 2.9 m/s, MG 20 mmHg, AVA 1.3 cm^2, DI 0.47   5. The inferior vena cava is normal in size with greater than 50%  respiratory variability, suggesting right atrial pressure of 3 mmHg.    Echo Summary:  Right Carotid: Velocities in the right ICA are consistent with Deborah Hutchinson 1-39%  stenosis.                Unable to match stenosis found by CTA at outside hospital.                 Vessels are severely tortuous throughout.   Left Carotid: Velocities in the left ICA are consistent with Deborah Hutchinson 1-39%  stenosis.               Unable to match stenosis found by CTA at outside hospital.  Vessels               are severely tortuous throughout.   Vertebrals:  Bilateral vertebral arteries demonstrate antegrade flow.  Subclavians: Normal flow hemodynamics were seen in bilateral subclavian               arteries.   *See table(s) above for measurements and observations.   Consultations: Vascular   Discharge Exam: Vitals:   03/24/24 1600 03/24/24 1615  BP:  132/60  Pulse: 74 72  Resp: 17 (!) 21  Temp:    SpO2: 100% 100%   No new complaints C/o chronic neck pain (present for Deborah Hutchinson year), chronic HA No LH today, no CP Daughter at bedside Comfortable going home  General: No acute distress. Cardiovascular: Heart sounds show Deborah Hutchinson regular rate, and rhythm. Lungs: Clear to auscultation bilaterally. Neurological: Alert and oriented 3. Moves all extremities 4 with equal strength. Cranial nerves II through XII grossly intact. Extremities: No clubbing or cyanosis. No edema.   Discharge Instructions   Discharge Instructions     Ambulatory  referral to Physical Therapy   Complete by: As directed    Diet - low sodium heart healthy   Complete by: As directed    Discharge instructions   Complete by: As directed    You were seen for issues with lightheadedness and dizziness.  Your imaging at the outside hospital was concerning for carotid stenosis so you were transferred here for additional workup.  It's not immediately clear to me what the primary cause for your symptoms are, but your workup has been reassuring.  I'll message cardiology to arrange an outpatient event monitor.    You should follow the results of your ultrasound with cardiology outpatient, you have moderate aortic stenosis which should be monitored with cardiology.  I recommend discussing the frequent use of nitroglycerin  with your cardiologist and moderating this use if possible.    You should continue to follow up with neurology for your chronic headaches.  You should also discuss your persistent lightheadedness with neurology and cardiology.  Continue your aspirin  and statin.  Follow your facial numbness symptoms with neurology outpatient.  You'll follow with vascular for your abnormal carotid artery imaging as an outpatient.   Your medicines should be reviewed with your PCP.  I would recommend simplifying your regimen where it's appropriate.  I'll send you to therapy outpatient for your gait issues.   Return for new, recurrent, or worsening symptoms.  Please ask your PCP to request records from this hospitalization so they know what was done and what the next steps will be.   Increase activity slowly   Complete by: As directed       Allergies as of 03/24/2024       Reactions   Penicillins Rash   Reaction was 30years ago  Has never taken again   Ampicillin Hives   Gabapentin Other (See Comments)   Hallucinations         Medication List     TAKE these medications    Accu-Chek FastClix Lancets Misc Apply 102 each topically 2 (two) times  daily.   Accu-Chek Guide Test test strip Generic drug: glucose blood 1 each by Other route as needed for other.   albuterol  108 (90 Base) MCG/ACT inhaler Commonly known as: VENTOLIN  HFA Inhale 2 puffs into the lungs every 6 (six) hours as needed for wheezing or shortness of breath.   albuterol  (2.5 MG/3ML) 0.083% nebulizer solution Commonly known as: PROVENTIL  Take 2.5 mg by nebulization as needed for shortness of breath or wheezing.   amLODipine  10 MG tablet Commonly known as: NORVASC  Take 10 mg by mouth daily.   aspirin  EC 81 MG tablet Take 81 mg by mouth daily. Swallow whole.   benzonatate 200 MG capsule Commonly known as: TESSALON Take 200 mg by mouth at bedtime as needed for cough.   buprenorphine 10 MCG/HR Ptwk Commonly known as: BUTRANS Place 1 patch onto the skin once Jaterrius Ricketson  week.   buPROPion  300 MG 24 hr tablet Commonly known as: WELLBUTRIN  XL Take 300 mg by mouth in the morning.   CALCIUM  PO Take 1 tablet by mouth daily.   donepezil 10 MG tablet Commonly known as: ARICEPT Take 10 mg by mouth daily at 12 noon.   DULoxetine 30 MG capsule Commonly known as: Cymbalta Take 1 capsule daily for 2 weeks, then increase to 2 capsules daily.  Contact me for refill.   EPINEPHrine  0.3 mg/0.3 mL Soaj injection Commonly known as: EPI-PEN Inject 0.3 mg into the muscle as needed for anaphylaxis.   famotidine  40 MG tablet Commonly known as: PEPCID  Take 1 tablet (40 mg total) by mouth 2 (two) times daily.   fluticasone  50 MCG/ACT nasal spray Commonly known as: FLONASE  Place 2 sprays into both nostrils daily.   hydrocortisone  25 MG suppository Commonly known as: Anucort-HC  INSERT RECTALLY AT BEDTIME FOR 5 NIGHTS What changed:  how much to take how to take this when to take this reasons to take this additional instructions   hydrocortisone -pramoxine rectal foam Commonly known as: PROCTOFOAM-HC Place 1 applicator rectally 2 (two) times daily as needed for  hemorrhoids or anal itching.   IRON PO Take 1 tablet by mouth in the morning.   isosorbide  mononitrate 120 MG 24 hr tablet Commonly known as: IMDUR  Take 1 tablet by mouth once daily   Jardiance 10 MG Tabs tablet Generic drug: empagliflozin Take 10 mg by mouth daily.   levocetirizine 5 MG tablet Commonly known as: XYZAL  TAKE 1 TABLET BY MOUTH ONCE DAILY IN THE EVENING   linaclotide  72 MCG capsule Commonly known as: LINZESS  Take 72 mcg by mouth as needed (constipation).   methocarbamol 500 MG tablet Commonly known as: ROBAXIN Take 500 mg by mouth 3 (three) times daily. What changed:  when to take this reasons to take this   montelukast  10 MG tablet Commonly known as: SINGULAIR  Take 10 mg by mouth at bedtime.   multivitamin capsule Take 1 capsule by mouth in the morning.  Women's Multivitamin Gummy   nitroGLYCERIN  0.4 MG SL tablet Commonly known as: NITROSTAT  DISSOLVE 1 TABLET UNDER THE TONGUE AS NEEDED FOR CHEST PAIN EVERY 5 MINUTES UP TO 3 TIMES. IF NO RELIEF CALL 911. *NEW PRESCRIPTION REQUEST* What changed: See the new instructions.   nystatin cream Commonly known as: MYCOSTATIN Apply 1 Application topically 2 (two) times daily as needed (skin irritation/rash).   oxybutynin  5 MG tablet Commonly known as: DITROPAN  Take 1 tablet (5 mg total) by mouth every 8 (eight) hours as needed for bladder spasms.   Ozempic (1 MG/DOSE) 4 MG/3ML Sopn Generic drug: Semaglutide (1 MG/DOSE) Inject 1 mg into the skin once Hikari Tripp week.   pantoprazole  40 MG tablet Commonly known as: PROTONIX  Take 1 tablet (40 mg total) by mouth daily.   rosuvastatin  20 MG tablet Commonly known as: CRESTOR  Take 1 tablet (20 mg total) by mouth daily.   traZODone 50 MG tablet Commonly known as: DESYREL Take 100 mg by mouth at bedtime.   Trelegy Ellipta 200-62.5-25 MCG/INH Aepb Generic drug: Fluticasone -Umeclidin-Vilant Inhale 2 puffs into the lungs daily.   vitamin B-12 500 MCG tablet Commonly  known as: CYANOCOBALAMIN Take 500 mcg by mouth in the morning.   Vitamin D  (Ergocalciferol ) 1.25 MG (50000 UNIT) Caps capsule Commonly known as: DRISDOL Take 50,000 Units by mouth every 30 (thirty) days.       Allergies  Allergen Reactions   Penicillins Rash    Reaction was  30years ago  Has never taken again   Ampicillin Hives   Gabapentin Other (See Comments)     Hallucinations     Follow-up Information     V[facility] at [facility] Hutchinson [facility] [place] Follow up in 3 month(s).   Specialty: Vascular Surgery Why: The office will call you with your appointment Contact information: [address] [place]  [address] [phone]                 The results of significant diagnostics from this hospitalization (including imaging, microbiology, ancillary and laboratory) are listed below for reference.    Significant Diagnostic Studies: ECHOCARDIOGRAM COMPLETE Result Date: 03/24/2024    ECHOCARDIOGRAM REPORT   Patient Name:   Deborah Hutchinson Date of Exam: 03/24/2024 Medical Rec #:  [id number]             Height:       65.5 in Accession #:    [id number]            Weight:       158.7 lb Date of Birth:  21-Mar-1946              BSA:          1.803 m Patient Age:    78 years              BP:           152/87 mmHg Patient Gender: F                     HR:           63 bpm. Exam Location:  Inpatient Procedure: 2D Echo (Both Spectral and Color Flow Doppler were utilized during            procedure). Indications:    Other abnormalities of the heart  History:        Patient has prior history of Echocardiogram examinations. Aortic                 Valve Disease; Signs/Symptoms:Other abnormalities of the heart.  Sonographer:    Norleen Amour Referring Phys: Jyles Sontag CALDWELL POWELL JR IMPRESSIONS  1. Left ventricular ejection fraction, by estimation, is 70 to 75%. The left ventricle has hyperdynamic function. The left ventricle has no regional  wall motion abnormalities. There is mild asymmetric left ventricular hypertrophy of the basal-septal segment. Left ventricular diastolic parameters are consistent with Grade II diastolic dysfunction (pseudonormalization). Elevated left atrial pressure.  2. Right ventricular systolic function is normal. The right ventricular size is normal. Tricuspid regurgitation signal is inadequate for assessing PA pressure.  3. The mitral valve is degenerative. Trivial mitral valve regurgitation. Moderate mitral annular calcification.  4. The aortic valve is tricuspid. There is moderate calcification of the aortic valve. Aortic valve regurgitation is trivial. Moderate aortic valve stenosis. Vmax 2.9 m/s, MG 20 mmHg, AVA 1.3 cm^2, DI 0.47  5. The inferior vena cava is normal in size with greater than 50% respiratory variability, suggesting right atrial pressure of 3 mmHg. FINDINGS  Left Ventricle: Left ventricular ejection fraction, by estimation, is 70 to 75%. The left ventricle has hyperdynamic function. The left ventricle has no regional wall motion abnormalities. The left ventricular internal cavity size was normal in size. There is mild asymmetric left ventricular hypertrophy of the basal-septal segment. Left ventricular diastolic parameters are consistent with Grade II diastolic dysfunction (pseudonormalization). Elevated left  atrial pressure. Right Ventricle: The right ventricular size is normal. No increase in right ventricular wall thickness. Right ventricular systolic function is normal. Tricuspid regurgitation signal is inadequate for assessing PA pressure. Left Atrium: Left atrial size was normal in size. Right Atrium: Right atrial size was normal in size. Pericardium: There is no evidence of pericardial effusion. Mitral Valve: The mitral valve is degenerative in appearance. Moderate mitral annular calcification. Trivial mitral valve regurgitation. MV peak gradient, 10.4 mmHg. The mean mitral valve gradient is 3.0  mmHg. Tricuspid Valve: The tricuspid valve is normal in structure. Tricuspid valve regurgitation is trivial. Aortic Valve: The aortic valve is tricuspid. There is moderate calcification of the aortic valve. Aortic valve regurgitation is trivial. Moderate aortic stenosis is present. Aortic valve mean gradient measures 17.0 mmHg. Aortic valve peak gradient measures 31.0 mmHg. Aortic valve area, by VTI measures 1.39 cm. Pulmonic Valve: The pulmonic valve was not well visualized. Pulmonic valve regurgitation is trivial. Aorta: The aortic root and ascending aorta are structurally normal, with no evidence of dilitation. Venous: The inferior vena cava is normal in size with greater than 50% respiratory variability, suggesting right atrial pressure of 3 mmHg. IAS/Shunts: The interatrial septum was not well visualized.  LEFT VENTRICLE PLAX 2D LVIDd:         4.20 cm     Diastology LVIDs:         2.30 cm     LV e' medial:    5.66 cm/s LV PW:         0.80 cm     LV E/e' medial:  17.8 LV IVS:        0.70 cm     LV e' lateral:   6.31 cm/s LVOT diam:     1.90 cm     LV E/e' lateral: 16.0 LV SV:         83 LV SV Index:   46 LVOT Area:     2.84 cm  LV Volumes (MOD) LV vol d, MOD A2C: 58.1 ml LV vol d, MOD A4C: 69.3 ml LV vol s, MOD A2C: 17.4 ml LV vol s, MOD A4C: 17.2 ml LV SV MOD A2C:     40.7 ml LV SV MOD A4C:     69.3 ml LV SV MOD BP:      47.1 ml RIGHT VENTRICLE             IVC RV Basal diam:  3.70 cm     IVC diam: 1.00 cm RV S prime:     20.10 cm/s TAPSE (M-mode): 2.6 cm      PULMONARY VEINS                             Diastolic Velocity: 37.70 cm/s                             S/D Velocity:       1.90                             Systolic Velocity:  70.70 cm/s LEFT ATRIUM             Index        RIGHT ATRIUM           Index LA diam:        2.90 cm 1.61 cm/m  RA Area:     15.50 cm LA Vol (A2C):   40.3 ml 22.35 ml/m  RA Volume:   34.20 ml  18.97 ml/m LA Vol (A4C):   35.7 ml 19.80 ml/m LA Biplane Vol: 37.9 ml 21.02 ml/m   AORTIC VALVE                     PULMONIC VALVE AV Area (Vmax):    1.30 cm      PV Vmax:       1.17 m/s AV Area (Vmean):   1.32 cm      PV Peak grad:  5.5 mmHg AV Area (VTI):     1.39 cm AV Vmax:           278.50 cm/s AV Vmean:          196.250 cm/s AV VTI:            0.602 m AV Peak Grad:      31.0 mmHg AV Mean Grad:      17.0 mmHg LVOT Vmax:         127.50 cm/s LVOT Vmean:        91.200 cm/s LVOT VTI:          0.294 m LVOT/AV VTI ratio: 0.49  AORTA Ao Root diam: 2.50 cm Ao Asc diam:  3.00 cm MITRAL VALVE MV Area (PHT): 2.27 cm     SHUNTS MV Area VTI:   2.02 cm     Systemic VTI:  0.29 m MV Peak grad:  10.4 mmHg    Systemic Diam: 1.90 cm MV Mean grad:  3.0 mmHg MV Vmax:       1.61 m/s MV Vmean:      86.5 cm/s MV Decel Time: 334 msec MV E velocity: 101.00 cm/s MV Mry Lamia velocity: 166.00 cm/s MV E/Earl Zellmer ratio:  0.61 Deborah Nanas MD Electronically signed by Deborah Nanas MD Signature Date/Time: 03/24/2024/3:51:44 PM    Final    VAS US  CAROTID Result Date: 03/24/2024 Carotid Arterial Duplex Study Patient Name:  Deborah Hutchinson  Date of Exam:   03/23/2024 Medical Rec #: [id number]              Accession #:    [id number] Date of Birth: 11-28-1945               Patient Gender: F Patient Age:   89 years Exam Location:  [facility] Procedure:      VAS US  CAROTID Referring Phys: TERETHA DAMME --------------------------------------------------------------------------------  Indications:       Progressively worsening vertigo and pre-syncope. CTA of neck                    done at [facility] indicated >70% stenosis of the bilateral                    carotid arteries. Risk Factors:      Hypertension, hyperlipidemia, Diabetes, coronary artery                    disease. Other Factors:     Mild aortic stenosis. Limitations        Today's exam was limited due to the high bifurcation of the                    carotid and Severe bilateral vessel tortuosity. Comparison Study:  No prior study on file Performing  Technologist: Alberta Lis RVS  Examination Guidelines:  Tye Juarez complete evaluation includes B-mode imaging, spectral Doppler, color Doppler, and power Doppler as needed of all accessible portions of each vessel. Bilateral testing is considered an integral part of Aftyn Nott complete examination. Limited examinations for reoccurring indications may be performed as noted.  Right Carotid Findings: +----------+--------+--------+--------+------------------+---------------------+           PSV cm/sEDV cm/sStenosisPlaque DescriptionComments              +----------+--------+--------+--------+------------------+---------------------+ CCA Prox  124     14                                tortuous, intimal                                                         thickening            +----------+--------+--------+--------+------------------+---------------------+ CCA Distal56      12                                intimal thickening    +----------+--------+--------+--------+------------------+---------------------+ ICA Prox  58      15      1-39%   calcific          tortuous              +----------+--------+--------+--------+------------------+---------------------+ ICA Mid   51      14                                tortuous              +----------+--------+--------+--------+------------------+---------------------+ ICA Distal72      19                                tortuous              +----------+--------+--------+--------+------------------+---------------------+ ECA       59      9                                 tortuous              +----------+--------+--------+--------+------------------+---------------------+ +----------+--------+-------+--------+-------------------+           PSV cm/sEDV cmsDescribeArm Pressure (mmHG) +----------+--------+-------+--------+-------------------+ Subclavian113                                         +----------+--------+-------+--------+-------------------+ +---------+--------+--+--------+--+ VertebralPSV cm/s74EDV cm/s27 +---------+--------+--+--------+--+  Left Carotid Findings: +----------+--------+--------+--------+------------------+---------------------+           PSV cm/sEDV cm/sStenosisPlaque DescriptionComments              +----------+--------+--------+--------+------------------+---------------------+ CCA Prox  110     15                                tortuous, intimal  thickening            +----------+--------+--------+--------+------------------+---------------------+ CCA Distal77      16                                                      +----------+--------+--------+--------+------------------+---------------------+ ICA Prox  81      21      1-39%   heterogenous      tortuous              +----------+--------+--------+--------+------------------+---------------------+ ICA Mid   112     32                                tortuous              +----------+--------+--------+--------+------------------+---------------------+ ICA Distal131     48                                tortuous              +----------+--------+--------+--------+------------------+---------------------+ ECA       65      11                                tortuous              +----------+--------+--------+--------+------------------+---------------------+ +----------+--------+--------+--------+-------------------+           PSV cm/sEDV cm/sDescribeArm Pressure (mmHG) +----------+--------+--------+--------+-------------------+ Subclavian102                                         +----------+--------+--------+--------+-------------------+ +---------+--------+--+--------+--+ VertebralPSV cm/s44EDV cm/s10 +---------+--------+--+--------+--+   Summary: Right Carotid: Velocities in the right ICA  are consistent with Deborah Hutchinson 1-39% stenosis.                Unable to match stenosis found by CTA at outside hospital.                Vessels are severely tortuous throughout. Left Carotid: Velocities in the left ICA are consistent with Deborah Hutchinson 1-39% stenosis.               Unable to match stenosis found by CTA at outside hospital. Vessels               are severely tortuous throughout. Vertebrals:  Bilateral vertebral arteries demonstrate antegrade flow. Subclavians: Normal flow hemodynamics were seen in bilateral subclavian              arteries. *See table(s) above for measurements and observations.  Electronically signed by Penne Colorado MD on 03/24/2024 at 10:44:11 AM.    Final    DG Knee 1-2 Views Right Result Date: 03/23/2024 CLINICAL DATA:  Right knee pain EXAM: RIGHT KNEE - 1-2 VIEW COMPARISON:  06/26/2020 FINDINGS: Frontal and lateral views of the right knee demonstrate no fracture, subluxation, or dislocation. There is mild 3 compartmental osteoarthritis greatest in the medial and patellofemoral compartments, slightly more pronounced than prior study. Small joint effusion. Soft tissues are unremarkable. IMPRESSION: 1. Mild progressive 3 compartmental osteoarthritis. 2. Small joint effusion. 3. No acute  fracture. Electronically Signed   By: Ozell Daring M.D.   On: 03/23/2024 18:07   DG HIP UNILAT WITH PELVIS 1V RIGHT Result Date: 03/23/2024 CLINICAL DATA:  Hip pain EXAM: DG HIP (WITH OR WITHOUT PELVIS) 1V RIGHT COMPARISON:  None Available. FINDINGS: Frontal view of the pelvis as well as Ripley Lovecchio frontal view of the right hip are obtained on 2 images. No acute displaced fractures. Alignment is anatomic. Joint spaces are relatively well preserved. Sacroiliac joints are normal. Postsurgical changes from discectomy and fusion at the L4-5 level. IMPRESSION: 1. No acute displaced fracture. Electronically Signed   By: Ozell Daring M.D.   On: 03/23/2024 18:06    Microbiology: No results found for this or any previous  visit (from the past 240 hours).   Labs: Basic Metabolic Panel: Recent Labs  Lab 03/23/24 0421 03/24/24 0535  NA 142 142  K 3.0* 3.3*  CL 108 107  CO2 25 24  GLUCOSE 129* 97  BUN 8 9  CREATININE 0.93 0.76  CALCIUM  9.0 9.0  MG 1.9 1.8  PHOS  --  3.6   Liver Function Tests: Recent Labs  Lab 03/23/24 0421 03/24/24 0535  AST 31 29  ALT 25 27  ALKPHOS 77 84  BILITOT 0.6 0.5  PROT 5.7* 6.1*  ALBUMIN 3.2* 3.3*   No results for input(s): LIPASE, AMYLASE in the last 168 hours. No results for input(s): AMMONIA in the last 168 hours. CBC: Recent Labs  Lab 03/23/24 0421 03/24/24 0535  WBC 4.5 5.0  NEUTROABS 2.2 2.8  HGB 11.5* 12.6  HCT 36.0 40.1  MCV 87.8 89.3  PLT 271 282   Cardiac Enzymes: No results for input(s): CKTOTAL, CKMB, CKMBINDEX, TROPONINI in the last 168 hours. BNP: BNP (last 3 results) No results for input(s): BNP in the last 8760 hours.  ProBNP (last 3 results) No results for input(s): PROBNP in the last 8760 hours.  CBG: Recent Labs  Lab 03/23/24 1738 03/23/24 2134 03/24/24 0751 03/24/24 1147 03/24/24 1607  GLUCAP 157* 134* 86 124* 104*       Signed:  Meliton Monte MD.  [facility] 03/24/2024, 4:59 PM

## 2024-03-24 NOTE — Progress Notes (Signed)
  Echocardiogram 2D Echocardiogram has been performed.  Norleen ORN [facility] 03/24/2024, 11:23 AM

## 2024-03-24 NOTE — Progress Notes (Signed)
 PROGRESS NOTE    Deborah Hutchinson  FMW:[id number] DOB: 10/05/1945 DOA: 03/22/2024 PCP: Gable Cambric, MD  No chief complaint on file.   Brief Narrative:   Deborah Hutchinson is Deborah Hutchinson 78 y.o. female with medical history significant for type 2 diabetes mellitus, COPD, essential hypertension, hyperlipidemia, who is admitted to [facility] on 03/22/2024 by way of transfer from [facility] emergency department with bilateral carotid artery stenosis after presenting from home to the latter facility complaining of vertigo.   Assessment & Plan:   Principal Problem:   Carotid artery stenosis, symptomatic, bilateral Active Problems:   GERD (gastroesophageal reflux disease)   HLD (hyperlipidemia)   Vertigo   DM2 (diabetes mellitus, type 2) (HCC)   History of COPD   History of essential hypertension  Presyncope Lightheadedness She denies room spinning sensation or feeling like the floor is moving, rather she notes chronic lightheadedness/presyncope symptoms (feeling like she's going to faint).  Never loses consciousness.  2 years.  Sudden worsening 11/14.  Typically occurs when she's sitting (occurred when she was lying flat on 11/14). CTA at [place] showed bilateral severe carotid artery stenosis MRI was negative for stroke She doesn't describe vertigo - I clarified multiple times, no room spinning, floor moving - her symptoms described as feeling as if she's going to faint.   Ddx for presyncope/lightheadedness is broad.  Not she has hx orthostatic hypotension in one of her neurology notes. Check orthostatics TSH wnl Normal ACTH stim Follow echo results pending Currently in sinus on tele, will plan for event monitor on discharge - symptoms not related to change in position and occurring while seated concerning for arrhythmia.    Right Sided Facial Numbness MRI was negative for stroke - negative for acute intracranial pathology, age appropriate atrophy and small vessel disease Seen  by tele neuro at [facility], recommended MRI (as above) and evaluation of stroke risk factors, recommended discussing with vascular if stroke on MRI Recommended continued aspirin , statin In setting of isolated facial sx, no additional w/u recommended - agreed with w/u of LH as above  Bilateral Carotid Artery Stenosis CTA head/neck negative for acute intracranial pathology, severe stenosis of greater than 70% in both distal internal carotid arteries, no LVO.  Moderate severe stenosis at the origin of the R internal carotid.  Severe greater than 70% stenosis at the origin of the left ICA. Carotid US  1-39% stenosis bilaterally - tortuous vessels Vascular planning for outpatient follow up - low suspicion her symptoms related to carotid stenosis  Gait Abnormality Chronic R hip pain - seems to have issues with gait related to this R knee with progressive 3 compartmental OA, small effusion, plain films of R hip without acute fx Therapy  Chronic Headache Saw neurology outpatient who had concern for possible post traumatic HA Continue follow up with neurology outpatient Try to limit nitroglycerin   Chronic Chest Pain CAD Chronic, she uses SL nitroglycerin  almost daily for Deborah Hutchinson year - encouraged to follow with cardiology outpatient to discuss this further Troponin at OSH negative Follow echo  Coronary CTA nonobstructive in 2024 imdur  Continue aspirin , crestor   Chronic Neck Pain Symptomatic management  COPD No si/sx exacerbation  T2DM A1c 5.4 SSI  HTN Amlodipine , imdur   Dyslipidemia Crestor     DVT prophylaxis: SCD Code Status: full Family Communication: daughter at bedside Disposition:   Status is: Observation The patient remains OBS appropriate and will d/c before 2 midnights.   Consultants:  vascular  Procedures:  none  Antimicrobials:  Anti-infectives (From admission,  onward)    None       Subjective: No new complaints Notes chronic neck pain,  headache  Objective: Vitals:   03/24/24 0748 03/24/24 0752 03/24/24 1116 03/24/24 1133  BP: (!) 154/77  (!) 154/111 (!) 165/81  Pulse: 70 61 68 70  Resp: (!) 24  20 20   Temp: 98.5 F (36.9 C)  97.8 F (36.6 C) 97.9 F (36.6 C)  TempSrc: Oral  Oral Oral  SpO2: 98% 98%    Weight:      Height:       No intake or output data in the 24 hours ending 03/24/24 1515  Filed Weights   03/23/24 0511 03/24/24 0453  Weight: 73.3 kg 72 kg    Examination:  General: No acute distress. Cardiovascular: RRR Lungs: unlabored Neurological: Alert and oriented 3. Moves all extremities 4 with equal strength. Cranial nerves II through XII grossly intact. Skin: Warm and dry. No rashes or lesions. Extremities: No clubbing or cyanosis. No edema.   Data Reviewed: I have personally reviewed following labs and imaging studies  CBC: Recent Labs  Lab 03/23/24 0421 03/24/24 0535  WBC 4.5 5.0  NEUTROABS 2.2 2.8  HGB 11.5* 12.6  HCT 36.0 40.1  MCV 87.8 89.3  PLT 271 282    Basic Metabolic Panel: Recent Labs  Lab 03/23/24 0421 03/24/24 0535  NA 142 142  K 3.0* 3.3*  CL 108 107  CO2 25 24  GLUCOSE 129* 97  BUN 8 9  CREATININE 0.93 0.76  CALCIUM  9.0 9.0  MG 1.9 1.8  PHOS  --  3.6    GFR: Estimated Creatinine Clearance: 56.4 mL/min (by C-G formula based on SCr of 0.76 mg/dL).  Liver Function Tests: Recent Labs  Lab 03/23/24 0421 03/24/24 0535  AST 31 29  ALT 25 27  ALKPHOS 77 84  BILITOT 0.6 0.5  PROT 5.7* 6.1*  ALBUMIN 3.2* 3.3*    CBG: Recent Labs  Lab 03/23/24 1622 03/23/24 1738 03/23/24 2134 03/24/24 0751 03/24/24 1147  GLUCAP 141* 157* 134* 86 124*     No results found for this or any previous visit (from the past 240 hours).       Radiology Studies: VAS US  CAROTID Result Date: 03/24/2024 Carotid Arterial Duplex Study Patient Name:  Deborah Hutchinson  Date of Exam:   03/23/2024 Medical Rec #: [id number]              Accession #:    [id number]  Date of Birth: 1945/08/14               Patient Gender: F Patient Age:   37 years Exam Location:  [facility] Procedure:      VAS US  CAROTID Referring Phys: TERETHA DAMME --------------------------------------------------------------------------------  Indications:       Progressively worsening vertigo and pre-syncope. CTA of neck                    done at [facility] indicated >70% stenosis of the bilateral                    carotid arteries. Risk Factors:      Hypertension, hyperlipidemia, Diabetes, coronary artery                    disease. Other Factors:     Mild aortic stenosis. Limitations        Today's exam was limited due to the high bifurcation of the  carotid and Severe bilateral vessel tortuosity. Comparison Study:  No prior study on file Performing Technologist: Deborah Hutchinson RVS  Examination Guidelines: Deborah Hutchinson complete evaluation includes B-mode imaging, spectral Doppler, color Doppler, and power Doppler as needed of all accessible portions of each vessel. Bilateral testing is considered an integral part of Deborah Hutchinson complete examination. Limited examinations for reoccurring indications may be performed as noted.  Right Carotid Findings: +----------+--------+--------+--------+------------------+---------------------+           PSV cm/sEDV cm/sStenosisPlaque DescriptionComments              +----------+--------+--------+--------+------------------+---------------------+ CCA Prox  124     14                                tortuous, intimal                                                         thickening            +----------+--------+--------+--------+------------------+---------------------+ CCA Distal56      12                                intimal thickening    +----------+--------+--------+--------+------------------+---------------------+ ICA Prox  58      15      1-39%   calcific          tortuous               +----------+--------+--------+--------+------------------+---------------------+ ICA Mid   51      14                                tortuous              +----------+--------+--------+--------+------------------+---------------------+ ICA Distal72      19                                tortuous              +----------+--------+--------+--------+------------------+---------------------+ ECA       59      9                                 tortuous              +----------+--------+--------+--------+------------------+---------------------+ +----------+--------+-------+--------+-------------------+           PSV cm/sEDV cmsDescribeArm Pressure (mmHG) +----------+--------+-------+--------+-------------------+ Subclavian113                                        +----------+--------+-------+--------+-------------------+ +---------+--------+--+--------+--+ VertebralPSV cm/s74EDV cm/s27 +---------+--------+--+--------+--+  Left Carotid Findings: +----------+--------+--------+--------+------------------+---------------------+           PSV cm/sEDV cm/sStenosisPlaque DescriptionComments              +----------+--------+--------+--------+------------------+---------------------+ CCA Prox  110     15  tortuous, intimal                                                         thickening            +----------+--------+--------+--------+------------------+---------------------+ CCA Distal77      16                                                      +----------+--------+--------+--------+------------------+---------------------+ ICA Prox  81      21      1-39%   heterogenous      tortuous              +----------+--------+--------+--------+------------------+---------------------+ ICA Mid   112     32                                tortuous               +----------+--------+--------+--------+------------------+---------------------+ ICA Distal131     48                                tortuous              +----------+--------+--------+--------+------------------+---------------------+ ECA       65      11                                tortuous              +----------+--------+--------+--------+------------------+---------------------+ +----------+--------+--------+--------+-------------------+           PSV cm/sEDV cm/sDescribeArm Pressure (mmHG) +----------+--------+--------+--------+-------------------+ Subclavian102                                         +----------+--------+--------+--------+-------------------+ +---------+--------+--+--------+--+ VertebralPSV cm/s44EDV cm/s10 +---------+--------+--+--------+--+   Summary: Right Carotid: Velocities in the right ICA are consistent with Deborah Hutchinson 1-39% stenosis.                Unable to match stenosis found by CTA at outside hospital.                Vessels are severely tortuous throughout. Left Carotid: Velocities in the left ICA are consistent with Deborah Hutchinson 1-39% stenosis.               Unable to match stenosis found by CTA at outside hospital. Vessels               are severely tortuous throughout. Vertebrals:  Bilateral vertebral arteries demonstrate antegrade flow. Subclavians: Normal flow hemodynamics were seen in bilateral subclavian              arteries. *See table(s) above for measurements and observations.  Electronically signed by Penne Colorado MD on 03/24/2024 at 10:44:11 AM.    Final    DG Knee 1-2 Views Right Result Date: 03/23/2024 CLINICAL DATA:  Right knee pain EXAM: RIGHT KNEE - 1-2 VIEW  COMPARISON:  06/26/2020 FINDINGS: Frontal and lateral views of the right knee demonstrate no fracture, subluxation, or dislocation. There is mild 3 compartmental osteoarthritis greatest in the medial and patellofemoral compartments, slightly more pronounced than prior study. Small joint  effusion. Soft tissues are unremarkable. IMPRESSION: 1. Mild progressive 3 compartmental osteoarthritis. 2. Small joint effusion. 3. No acute fracture. Electronically Signed   By: Ozell Daring M.D.   On: 03/23/2024 18:07   DG HIP UNILAT WITH PELVIS 1V RIGHT Result Date: 03/23/2024 CLINICAL DATA:  Hip pain EXAM: DG HIP (WITH OR WITHOUT PELVIS) 1V RIGHT COMPARISON:  None Available. FINDINGS: Frontal view of the pelvis as well as Machel Violante frontal view of the right hip are obtained on 2 images. No acute displaced fractures. Alignment is anatomic. Joint spaces are relatively well preserved. Sacroiliac joints are normal. Postsurgical changes from discectomy and fusion at the L4-5 level. IMPRESSION: 1. No acute displaced fracture. Electronically Signed   By: Ozell Daring M.D.   On: 03/23/2024 18:06        Scheduled Meds:  amLODipine   10 mg Oral Daily   aspirin  EC  81 mg Oral Daily   budesonide -glycopyrrolate-formoterol   2 puff Inhalation BID   [START ON 03/25/2024] buPROPion   300 mg Oral q AM   donepezil  10 mg Oral Q1200   DULoxetine  30 mg Oral Daily   empagliflozin  10 mg Oral Daily   insulin  aspart  0-9 Units Subcutaneous TID WC   isosorbide  mononitrate  120 mg Oral Daily   lidocaine   1 patch Transdermal Q24H   montelukast   10 mg Oral QHS   pantoprazole   40 mg Oral Daily   rosuvastatin   20 mg Oral Daily   traZODone  100 mg Oral QHS   Continuous Infusions:   LOS: 1 day    Time spent: over 30 min     Meliton Monte, MD [facility]   To contact the attending provider between 7A-7P or the covering provider during after hours 7P-7A, please log into the web site [email or web address] and access using universal [place] password for that web site. If you do not have the password, please call the hospital operator.  03/24/2024, 3:15 PM

## 2024-03-24 NOTE — Evaluation (Signed)
 Occupational Therapy Evaluation Patient Details Name: Deborah Hutchinson MRN: [id number] DOB: 12-06-1945 Today's Date: 03/24/2024   History of Present Illness   Pt is an 78 y/o female admitted to [place] as transfer from [facility] ED with bil carotid stenosis with main complain of vertigo which has been chronic for 2 years, but has been increasing in frequency over that timeframe.  Todays CT / MRI showed no acute intracranial process/acute stroke.  PMHx: DM2, HLD, CAD, CA, Chronic pain/LBP, COPD     Clinical Impressions Pt admitted based on above, and was seen based on problem list below. PTA pt was independent with ADLs and IADLs. Today pt is requiring set up  to CGA for ADLs. Functional transfers are  CGA with use of RW. Pt CGA primarily d/t balance deficits, when pt experiences sharp pain in cervical region, tenses and becomes unsteady even in seated position. Encouraged pt to follow up with PCP regarding pain severity and type of pain, and encouraged seated ADLs for safety. OT will continue to follow acutely to maximize functional independence.      If plan is discharge home, recommend the following:   A little help with walking and/or transfers;A little help with bathing/dressing/bathroom;Assistance with cooking/housework;Assist for transportation     Functional Status Assessment   Patient has had a recent decline in their functional status and demonstrates the ability to make significant improvements in function in a reasonable and predictable amount of time.     Equipment Recommendations   None recommended by OT      Precautions/Restrictions   Precautions Precautions: Fall Recall of Precautions/Restrictions: Intact Restrictions Weight Bearing Restrictions Per Provider Order: No     Mobility Bed Mobility Overal bed mobility: Modified Independent             General bed mobility comments: use of rails    Transfers Overall transfer level: Needs  assistance Equipment used: Rolling walker (2 wheels) Transfers: Sit to/from Stand Sit to Stand: Contact guard assist           General transfer comment: Cues for handplacement, CGA d/t unsteadiness with sharp pain      Balance Overall balance assessment: Needs assistance Sitting-balance support: No upper extremity supported, Feet supported Sitting balance-Leahy Scale: Fair     Standing balance support: Bilateral upper extremity supported, No upper extremity supported Standing balance-Leahy Scale: Poor Standing balance comment: reliant on RW                           ADL either performed or assessed with clinical judgement   ADL Overall ADL's : Needs assistance/impaired Eating/Feeding: Set up;Sitting   Grooming: Set up;Sitting Grooming Details (indicate cue type and reason): Completes seated at baseline         Upper Body Dressing : Set up;Sitting   Lower Body Dressing: Contact guard assist;Sit to/from stand Lower Body Dressing Details (indicate cue type and reason): Able to figure 4 legs Toilet Transfer: Contact guard assist;Rolling walker (2 wheels);Ambulation           Functional mobility during ADLs: Contact guard assist;Rolling walker (2 wheels) General ADL Comments: Pt completes most ADLs seated, resports pain from back cervical spasms     Vision Baseline Vision/History: 1 Wears glasses Patient Visual Report: No change from baseline Vision Assessment?: Yes 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) Ocular Range of Motion: Within Functional Limits Alignment/Gaze Preference: Within Defined Limits Tracking/Visual Pursuits: Able to track stimulus in all  quads without difficulty Saccades: Within functional limits Convergence: Within functional limits Visual Fields: No apparent deficits Additional Comments: WFL            Pertinent Vitals/Pain Pain Assessment Pain Assessment: No/denies pain     Extremity/Trunk Assessment Upper  Extremity Assessment Upper Extremity Assessment: Generalized weakness   Lower Extremity Assessment Lower Extremity Assessment: Defer to PT evaluation       Communication Communication Communication: No apparent difficulties   Cognition Arousal: Alert Behavior During Therapy: WFL for tasks assessed/performed Cognition: No apparent impairments       Following commands: Intact       Cueing  General Comments   Cueing Techniques: Verbal cues  Daughter present and supportive           Home Living Family/patient expects to be discharged to:: Private residence Living Arrangements: Alone Available Help at Discharge: Family;Available PRN/intermittently;Home health (HH aid 5 days a week, 3 hours) Type of Home: House Home Access: Stairs to enter [employer] of Steps: 1 Entrance Stairs-Rails: None Home Layout: One level     Bathroom Shower/Tub: Chief Strategy Officer: Standard     Home Equipment: Cane - single point;Rollator (4 wheels);Wheelchair - power;Shower seat          Prior Functioning/Environment Prior Level of Function : Independent/Modified Independent;Needs assist             Mobility Comments: mod I with rollator in community, reports furniture walking around the house, denies falls ADLs Comments: mod I ADLs, assists with IADLs    OT Problem List: Decreased strength;Decreased activity tolerance;Impaired balance (sitting and/or standing);Cardiopulmonary status limiting activity   OT Treatment/Interventions: Self-care/ADL training;Therapeutic exercise;Energy conservation;DME and/or AE instruction;Therapeutic activities;Patient/family education;Balance training      OT Goals(Current goals can be found in the care plan section)   Acute Rehab OT Goals Patient Stated Goal: To go home OT Goal Formulation: With patient Time For Goal Achievement: 04/07/24 Potential to Achieve Goals: Good   OT Frequency:  Min 2X/week     Co-evaluation              AM-PAC OT 6 Clicks Daily Activity     Outcome Measure Help from another person eating meals?: None Help from another person taking care of personal grooming?: A Little Help from another person toileting, which includes using toliet, bedpan, or urinal?: A Little Help from another person bathing (including washing, rinsing, drying)?: A Little Help from another person to put on and taking off regular upper body clothing?: A Little Help from another person to put on and taking off regular lower body clothing?: A Little 6 Click Score: 19   End of Session Nurse Communication: Mobility status  Activity Tolerance: Patient tolerated treatment well Patient left: in bed;with call bell/phone within reach;with family/visitor present  OT Visit Diagnosis: Unsteadiness on feet (R26.81);Other abnormalities of gait and mobility (R26.89);Muscle weakness (generalized) (M62.81)                Time: 8565-8540 OT Time Calculation (min): 25 min Charges:  OT General Charges $OT Visit: 1 Visit OT Evaluation $OT Eval Moderate Complexity: 1 Mod  Adrianne BROCKS, OT  Acute Rehabilitation Services Office [phone] Secure chat preferred   Adrianne GORMAN Savers 03/24/2024, 4:00 PM

## 2024-03-25 ENCOUNTER — Ambulatory Visit: Attending: Cardiology

## 2024-03-25 ENCOUNTER — Other Ambulatory Visit: Payer: Self-pay | Admitting: Cardiology

## 2024-03-25 ENCOUNTER — Telehealth: Payer: Self-pay | Admitting: Podiatry

## 2024-03-25 DIAGNOSIS — R55 Syncope and collapse: Secondary | ICD-10-CM

## 2024-03-25 NOTE — Telephone Encounter (Signed)
 Called and left message to reschedule surgery as patient was admitted to hospital on 03/12/2024. Patient was having cardiovascular issues and they have not seen a cardiologist to follow-up as of yet.

## 2024-03-25 NOTE — Progress Notes (Unsigned)
Enrolled for [employer] to mail a ZIO XT long term holter monitor to the patients address on file.   Dr. Revankar to read. 

## 2024-03-25 NOTE — Progress Notes (Signed)
 14d Zio ordered for presyncope  Dr. Edwyna to read

## 2024-03-28 ENCOUNTER — Ambulatory Visit

## 2024-04-02 ENCOUNTER — Ambulatory Visit

## 2024-04-02 ENCOUNTER — Encounter: Admitting: Podiatry

## 2024-04-10 ENCOUNTER — Telehealth: Payer: Self-pay | Admitting: Neurology

## 2024-04-10 NOTE — Telephone Encounter (Signed)
 Per patient she been on the Cymbalta  for two now, but she started at 60 mg instead of 30 mg for two then increasing to 60 mg after that.   Per patient she had wired dream or Hallucination last night. Is that the reason.   Per patient she do not feel the difference between the Cymbalta  and Escitalopram ?

## 2024-04-10 NOTE — Telephone Encounter (Signed)
 Modestine L. (Self) PH: [phone]  Cerise called stating that Dr. Skeet took her off  Escitalopram (?)20 mg. And placed her on duloxetine  30 mg. She stating the medication is not helping and that it might have caused an Hallucination

## 2024-04-11 ENCOUNTER — Ambulatory Visit: Attending: Nurse Practitioner

## 2024-04-11 DIAGNOSIS — M25551 Pain in right hip: Secondary | ICD-10-CM | POA: Insufficient documentation

## 2024-04-11 DIAGNOSIS — M5459 Other low back pain: Secondary | ICD-10-CM | POA: Diagnosis present

## 2024-04-11 DIAGNOSIS — M6281 Muscle weakness (generalized): Secondary | ICD-10-CM | POA: Insufficient documentation

## 2024-04-11 DIAGNOSIS — R279 Unspecified lack of coordination: Secondary | ICD-10-CM | POA: Diagnosis present

## 2024-04-11 DIAGNOSIS — M62838 Other muscle spasm: Secondary | ICD-10-CM | POA: Insufficient documentation

## 2024-04-11 NOTE — Telephone Encounter (Signed)
 Patient advised per Dr.Jaffe, Discontinue Cymbalta  and she may restart escitalopram . Once she is feeling better, she should contact us  and we can try a different medication for headache. We can try zonisamide 25mg  daily.
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
## 2024-04-16 ENCOUNTER — Encounter: Payer: Self-pay | Admitting: Gastroenterology

## 2024-04-16 ENCOUNTER — Ambulatory Visit: Admitting: Physical Therapy

## 2024-04-16 ENCOUNTER — Ambulatory Visit: Admitting: Gastroenterology

## 2024-04-16 VITALS — BP 112/66 | HR 72 | Ht 65.0 in | Wt 164.5 lb

## 2024-04-16 DIAGNOSIS — D509 Iron deficiency anemia, unspecified: Secondary | ICD-10-CM

## 2024-04-16 DIAGNOSIS — K219 Gastro-esophageal reflux disease without esophagitis: Secondary | ICD-10-CM

## 2024-04-16 DIAGNOSIS — R1310 Dysphagia, unspecified: Secondary | ICD-10-CM

## 2024-04-16 DIAGNOSIS — R1012 Left upper quadrant pain: Secondary | ICD-10-CM

## 2024-04-16 DIAGNOSIS — K3184 Gastroparesis: Secondary | ICD-10-CM

## 2024-04-16 DIAGNOSIS — R10A2 Flank pain, left side: Secondary | ICD-10-CM

## 2024-04-16 MED ORDER — PANTOPRAZOLE SODIUM 40 MG PO TBEC: 40.0000 mg | DELAYED_RELEASE_TABLET | Freq: Two times a day (BID) | ORAL | 1 refills | Status: AC

## 2024-04-16 MED ORDER — PANTOPRAZOLE SODIUM 40 MG PO TBEC: 40.0000 mg | DELAYED_RELEASE_TABLET | Freq: Two times a day (BID) | ORAL | 1 refills | Status: DC

## 2024-04-16 NOTE — Progress Notes (Signed)
 IMPRESSION and PLAN:    #1.  GERD with large HH s/p repair Nov 2021, with slipped Nissen's.  Now having breakthrough symptoms including dysphagia  #2. Abdo pain LUQ- musculoskeletal. (Resolved). Neg CT AP as below  #3. 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 S/P multiple iron infusoins.  Followed by hematology. (Resolved off plavix , now on bASA) Prev GI workup included push SBE 10/06/2022 showing 3 cm HH, duodenal AVMs s/p APC. Nl Px jejunum VCE 10/06/2022 was neg for any small bowel active bleeding. Neg colon 11/2020 for etiology of IDA Neg PET 06/2022.  Majority of the hyperplastic gastric polyps were removed on EGD 03/2021 with Dr. Wilhelmenia.  Off plavix  Most recent hemoglobin 12.6 as below.  #4. Gastroparesis. Reglan  stopped d/t tardive dyskinesia.  #5. Peritoneal mesothelioma (low grade) at time of [facility] repair. Stable. Last CT chest/A/P March 2024 without progression. Being followed by Dr Claryce. Neg PET 06/2022    Plan: -Increase Protonix  40mg  po BID #60,6RF -Pepcid  20 mg p.o. at bedtime -UGI series with Ba tab too (in 2 weeks). If abn then EGD with dil.  Otherwise, conservative management. -Continue linzess  72mcg po Qsunday and PRN. -If still with problems, then rpt GES -She also has follow-up CT chest Abdo/pelvis (for peritoneal mesothelioma) scheduled at [facility] March 2026. -D/W pt and daughter. HPI:    Chief Complaint:   Deborah Hutchinson is a 78 y.o. female with CAD on plavix (off bASA), DM2, HH, OSA, COPD, LBP d/t OA, HTN, HLD.  Bladder CA s/p BCG (Dr Devere) Accompanied by her daughter  History of Present Illness  C/O persistent acid reflux symptoms.  Failed GI cocktail.  Better with baking soda as needed at night.  She experiences significant acid reflux throughout the day and night, which is alleviated by taking baking soda. The sensation starts in her stomach and rises up, causing a burning sensation in her throat, particularly at  night, disrupting her sleep. She is currently taking Protonix  and famotidine  for her stomach issues. She previously tried Reglan  but experienced adverse effects such as twitching, so it was discontinued.  Lately having problems swallowing pills-getting hung up in the lower chest.  She has a history of a large hiatal hernia that was repaired in 2021. Despite the repair, she continues to experience symptoms such as feeling full quickly and discomfort after eating. She avoids eating after 7 PM and ensures she walks for at least half an hour after meals to aid digestion.  She also deals with constipation, which has improved with therapy. She takes fiber supplements and Linzess  as needed, but avoids frequent use due to concerns about loose stools and soreness from hemorrhoids. She takes Linzess  on an as-needed basis, typically not more than once a week.  Her past medical history includes bladder cancer, for which she sees a urologist every three months. She also has a history of back issues, including L4, L5, and T10 problems, for which she recently received injections that have been helpful. No current constipation issues, and her bowel movements have improved with therapy. She experiences bloating when constipated.   Previous notes: S/P HH repair by Dr. Adelia November 2021. Was found to have low-grade mesothelioma.  She is closely being followed by Dr. Claryce.  No worsening. CT Abdo/pelvis March 2022, 07/2021 as below which did not show any progression.  Past GI procedures: CT AP with contrast 05/2023 1. Soft tissue anterior to the descending colon is in continuity with the  musculature of the anterior abdominal wall, multiple areas similar to this appear bilaterally although this is the largest and most pronounced as well is more conspicuous than on prior examinations but nonspecific and of uncertain clinical significance particularly given patient's complaint of left upper quadrant pain. 2. Small  hiatal hernia with symmetric distal esophageal wall thickening and wall thickening versus underdistention of the gastric antrum. Correlate for esophagitis and consider further evaluation with endoscopy. 3. Right lower lobe ground-glass opacities previously evaluated on PET-CT dated July 05, 2022 without significant uptake. Suggest continued CT chest surveillance. 4. Scattered colonic diverticulosis without findings of acute diverticulitis. 5. Fat containing right inguinal hernia. 6.  Aortic Atherosclerosis (ICD10-I70.0).  SBE 10/06/2022 showing 3 cm HH, duodenal AVMs s/p APC.  VCE 10/06/2022 was negative for any small bowel active bleeding.  CT C/A/P 3/192025 IMPRESSION:  1. No evidence of progressive neoplastic disease.  2.  Enhancement along the right posterior bladder wall is not apparent within the decompressed bladder today. If there is any clinical concern, CT urogram could further evaluate.    CT C/A/P wih contrast 07/26/2022 1.  There is a centimeter enhancing focus at the right bladder which raises concern for a primary urothelial neoplasm. Consider cystoscopy for further evaluation- AWAITING urology consulatation  2.  Distal sigmoid diverticula with adjacent stranding concerning for diverticulitis. No evidence of perforation or adjacent fluid collection.  3.  No convincing evidence of progressive peritoneal or diaphragmatic disease.  4.  Similar scattered groundglass opacities/nodules within the lungs and bilateral adrenal nodules.   PET 06/2022 IMPRESSION:  1. No tracer avid solid nodule or mass identified.  2. As noted previously there are multiple ground-glass nodules  identified within both lungs. These do not demonstrate any  appreciable tracer uptake. The largest ground-glass nodule is in the  periphery of the left upper lobe measuring 1.3 cm. Absent FDG uptake  does not exclude the possibility of underlying indolent neoplastic  process such as pulmonary  adenocarcinoma and continued interval  surveillance of these nodules is strongly recommended.  3. Stable left adrenal adenoma.  4. Coronary artery calcifications.  5.  Aortic Atherosclerosis (ICD10-I70.0).   CT chest/AP 07/2021 1.  No significant change in appearance of thickening of the diaphragm status post paraesophageal hernia repair. No definite new peritoneal disease or diaphragmatic nodularity identified.  2.  Similar groundglass opacities/nodules throughout the lungs bilaterally.  3.  Similar bilateral adrenal nodules. Can consider MR imaging for further characterization as clinically indicated.  EGD 03/2021 (Dr Wilhelmenia) - No gross lesions in esophagus proximally. LA Grade A esophagitis with no bleeding distally (this is improved from prior). - Z-line irregular, 34 cm from the incisors. - 3 cm hiatal hernia. - Four gastric polyps. Resected and retrieved. Clips (MR conditional) were placed. - Gastritis. Biopsied. - No gross lesions in the duodenal bulb, in the first portion of the duodenum and in the second portion of the duodenum.   EGD 11/27/2020 - LA Grade C reflux esophagitis with no bleeding. Bx- neg - 3 cm hiatal hernia. S/P fundoplication. - 3 10-12 mm semi-sessile polyps with no bleeding and no stigmata of recent bleeding were found in the gastric body (1 at the diaphragmatic hiatus and the other 2 in proximal body of the stomach). Bx- Hyperplastic - Normal examined duodenum.  Colon 11/27/2020 - Seven 6 to 8 mm polyps in the mid descending colon, in the mid transverse colon, in the proximal ascending colon and in the mid ascending colon, removed with a cold  snare. Resected and retrieved. Bx- TAs - Diverticulosis in the sigmoid colon and in the ascending colon.  EGD 08/2019: Candida esophagitis, no stricture, 7 cm hiatal hernia.  Treated empirically for Candida esophagitis  EGD 12/14/2018: Large HH, hyperplastic gastric polyps, eso stricture s/p dil 50 Fr. EGD 08/17/2015  mod HH, neg SB Bx for celiac.  UGI series 04/2019: HH, GERD, no strictures.  Ba tab passed without any problems.  Colonoscopy 12/14/2018-colonic polyps s/p polypectomy, mild pancolonic div. Bx- TA.  CT AP with contrast 02/09/2018: Neg, Stable left adrenal adenoma, small right inguinal hernia.  GES 08/2019: delayed gastric emptying (12% emptied at hr 1, 29% at hr 2, 47% at hr 3, 64% an hr 4).  CT AP 07/2020 Impression: 1. Mildly thickened appearance of the hemidiaphragms without a discrete measurable mass, overall similar relative to the prior CT dated 01/08/2020 in this patient with biopsy-proven peritoneal mesothelioma involving the right hemidiaphragm. No definite new peritoneal nodules are identified. 2. No ascites. 3. Similar groundglass opacities/nodules throughout the lungs bilaterally, most conspicuous in the right lower lobe. These findings may be infectious, inflammatory, or neoplastic in etiology. 4. 4 mm solid right lower lobe pulmonary nodule, unchanged. The previously identified solid nodule in the right upper lobe is no longer seen. 5. Indeterminate bilateral adrenal lesions, measuring up to 1.7 cm on the left. Consider recharacterization with MRI is recommended. 6. Multiple additional ancillary findings, as above.   ============================  PATHOLOGY: PATHOLOGY: Final Pathologic Diagnosis  A. Frozen peritoneal nodule:        Mesothelioma, epithelioid type, low tumor grade, trabecular pattern.       See comment.   B. Peritoneal nodule:       Mesothelioma, epithelioid type, low tumor grade, trabecular pattern.       See comment.     LHC 05/08/2019: Prox RCA lesion is 50% stenosed. Prox LAD lesion is 40% stenosed. The left ventricular systolic function is normal. LV end diastolic pressure is normal. The left ventricular ejection fraction is greater than 65% by visual estimate. There is no mitral valve regurgitation. Past Medical History:  Diagnosis Date    Aftercare following surgery 03/26/2020   Allergy 03/29/2021   Angina pectoris 06/17/2020   Anxiety    Arthritis    all over   Asthma    Follows w/ Dr. Marina, pulmonologist.   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   Chronic kidney disease    Chronic obstructive pulmonary disease (HCC) 02/09/2017   Formatting of this note might be different from the original. Last Assessment & Plan:  I will review her PFTs after I obtain records from her pulmonologist. Based on this we will decide if she needs inhalers, subjectively they do not seem to have helped her at all. She may benefit from a pulmonary rehabilitation program Formatting of this note might be different from the original. Last Assessment    COPD (chronic obstructive pulmonary disease) (HCC) 02/09/2017   Coronary artery disease involving native coronary artery of native heart without angina pectoris 06/10/2015   Depression    Diabetes mellitus due to underlying condition with unspecified complications (HCC) 06/10/2015   Dyslipidemia 06/10/2015   Esophageal dysphagia 09/02/2019   Formatting of  this note might be different from the original.  Added automatically from request for surgery [id number]     Essential hypertension 06/10/2015   Gastroparesis 09/04/2019   GERD (gastroesophageal reflux disease)    H/O hiatal hernia    Headache 09/24/2018   Heart murmur    Heme positive stool 10/06/2022   Hyperlipemia    Hypertension    Iron deficiency anemia 09/25/2020   Lumbar radicular pain 08/08/2022   Lumbar radiculopathy 12/18/2020   Mild aortic stenosis 05/16/2019   Mixed dyslipidemia 06/10/2015   Moderate aortic stenosis 05/16/2019   Nausea and vomiting 06/16/2020   occasional    Neurogenic claudication 12/18/2020   OSA (obstructive sleep apnea) 02/09/2017   Pain and swelling of toe of right foot 07/15/2016   Palpitations 06/02/2016   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   Preop cardiovascular exam 09/12/2022   S/P lumbar fusion 03/26/2020   Shortness of breath    on  excertion   Sleep apnea     Current Outpatient Medications  Medication Sig Dispense Refill   albuterol  (PROVENTIL  HFA;VENTOLIN  HFA) 108 (90 Base) MCG/ACT inhaler Inhale 2 puffs into the lungs every 6 (six) hours as needed for wheezing or shortness of breath. 3 Inhaler 3   amLODipine  (NORVASC ) 10 MG tablet Take 10 mg by mouth daily.     budesonide -formoterol  (SYMBICORT ) 160-4.5 MCG/ACT inhaler Inhale 2 puffs into the lungs 2 (two) times daily. 3 Inhaler 3   buPROPion  (WELLBUTRIN  XL) 300 MG 24 hr tablet Take 300 mg by mouth daily.      Cholecalciferol (VITAMIN D  PO) Take 5,000 Units by mouth as directed. Every 2 weeks     clopidogrel  (PLAVIX ) 75 MG tablet Take 1 tablet (75 mg total) by mouth daily. 30 tablet 11   dicyclomine (BENTYL) 20 MG tablet Take 20 mg by mouth every 6 (six) hours as needed.            escitalopram  (LEXAPRO ) 10 MG tablet Take 10 mg by mouth daily.     famotidine  (PEPCID ) 40 MG tablet Take 1 tablet (40 mg total) by mouth at bedtime. 180 tablet 3   fluticasone  (VERAMYST) 27.5 MCG/SPRAY nasal spray Place 2 sprays into the nose daily.     gabapentin (NEURONTIN) 300 MG capsule Take 300 mg by mouth 3 (three) times daily as needed.      hydrALAZINE  (APRESOLINE ) 50 MG tablet Take 1 tablet (50 mg total) by mouth 3 (three) times daily. 180 tablet 2   hydrochlorothiazide  (HYDRODIURIL ) 25 MG tablet Take 1 tablet (25 mg total) by mouth daily. 60 tablet 2   Lancets (ACCU-CHEK MULTICLIX) lancets      linaclotide  (LINZESS ) 72 MCG capsule Take 72 mcg by mouth as needed.     losartan  (COZAAR ) 100 MG tablet Take 1 tablet (100 mg total) by mouth daily. 60 tablet 2   metFORMIN (GLUCOPHAGE) 500  MG tablet Take 500 mg by mouth 2 (two) times daily with a meal.      montelukast  (SINGULAIR ) 10 MG tablet Take 10 mg by mouth at bedtime.      Multiple Vitamin (MULTIVITAMIN) capsule Take 1 capsule by mouth daily.       oxybutynin  (DITROPAN -XL) 10 MG 24 hr tablet Take 10 mg by mouth daily.     pantoprazole  (PROTONIX ) 40 MG tablet Take 1 tablet (40 mg total) by mouth daily. 60 tablet 2   Potassium Chloride  CR (MICRO-K ) 8 MEQ CPCR capsule CR TAKE 1 CAPSULE BY MOUTH ONCE DAILY FOR LOW POTASSIUM  simvastatin  (ZOCOR ) 20 MG tablet Take 20 mg by mouth at bedtime.     spironolactone  (ALDACTONE ) 25 MG tablet Take 1 tablet (25 mg total) by mouth daily. 60 tablet 2   traMADol (ULTRAM) 50 MG tablet Take 50 mg by mouth 2 (two) times daily as needed for pain.     VICTOZA 18 MG/3ML SOPN 12 mg daily.      BD PEN NEEDLE NANO U/F 32G X 4 MM MISC      isosorbide  mononitrate (IMDUR ) 120 MG 24 hr tablet Take 1 tablet (120 mg total) by mouth daily. (Patient not taking: Reported on 06/10/2019) 60 tablet 2   nitroGLYCERIN  (NITROSTAT ) 0.4 MG SL tablet DISSOLVE ONE TABLET UNDER THE TONGUE EVERY 5 MINUTES AS NEEDED FOR CHEST PAIN.  DO NOT EXCEED A TOTAL OF 3 DOSES IN 15 MINUTES (Patient not taking: Reported on 06/10/2019) 25 tablet 5   No current facility-administered medications for this visit.    Past Surgical History:  Procedure Laterality Date   ABDOMINAL HYSTERECTOMY     BIOPSY  03/22/2021   Procedure: BIOPSY;  Surgeon: Wilhelmenia Aloha Raddle., MD;  Location: [facility];  Service: Gastroenterology;;   BREAST SURGERY     begin mass,left  breast   COLONOSCOPY  08/17/2015   Colonic polyp status post polypectomy. Mild pancolonic diverticulosis. Highly redundant colon.    DILATION AND CURETTAGE OF UTERUS     one   ENDOSCOPIC MUCOSAL RESECTION N/A 03/22/2021   Procedure: ENDOSCOPIC MUCOSAL RESECTION;  Surgeon: Wilhelmenia Aloha Raddle., MD;  Location: [facility];  Service: Gastroenterology;  Laterality: N/A;    ENTEROSCOPY N/A 10/06/2022   Procedure: ENTEROSCOPY;  Surgeon: Charlanne Groom, MD;  Location: [facility];  Service: Gastroenterology;  Laterality: N/A;   ESOPHAGOGASTRODUODENOSCOPY  08/17/2015   Moderate hiatal hernia. Otherwise noraml EGD.    ESOPHAGOGASTRODUODENOSCOPY  09/03/2019   [facility]   ESOPHAGOGASTRODUODENOSCOPY (EGD) WITH PROPOFOL  N/A 03/22/2021   Procedure: ESOPHAGOGASTRODUODENOSCOPY (EGD) WITH PROPOFOL ;  Surgeon: Wilhelmenia Aloha Raddle., MD;  Location: [facility];  Service: Gastroenterology;  Laterality: N/A;   EUS  05/19/2011   Procedure: UPPER ENDOSCOPIC ULTRASOUND (EUS) LINEAR;  Surgeon: Toribio Cedar, MD;  Location: [facility];  Service: Endoscopy;  Laterality: N/A;  radial linear    GIVENS CAPSULE STUDY N/A 10/06/2022   Procedure: GIVENS CAPSULE STUDY;  Surgeon: Charlanne Groom, MD;  Location: [facility];  Service: Gastroenterology;  Laterality: N/A;   HEMOSTASIS CLIP PLACEMENT  03/22/2021   Procedure: HEMOSTASIS CLIP PLACEMENT;  Surgeon: Wilhelmenia Aloha Raddle., MD;  Location: [facility];  Service: Gastroenterology;;   HERNIA REPAIR  05/09/2020   HOT HEMOSTASIS N/A 10/06/2022   Procedure: HOT HEMOSTASIS (ARGON PLASMA COAGULATION/BICAP);  Surgeon: Charlanne Groom, MD;  Location: [facility];  Service: Gastroenterology;  Laterality: N/A;   HYSTEROTOMY     KNEE SURGERY     LAPAROSCOPIC PARAESOPHAGEAL HERNIA REPAIR  12/07/2019   LEFT HEART CATH AND CORONARY ANGIOGRAPHY     LEFT HEART CATH AND CORONARY ANGIOGRAPHY N/A 05/08/2019   Procedure: LEFT HEART CATH AND CORONARY ANGIOGRAPHY;  Surgeon: Verlin Lonni BIRCH, MD;  Location: [facility];  Service: Cardiovascular;  Laterality: N/A;   right thumb     tendon repair   RIGHT/LEFT HEART CATH AND CORONARY ANGIOGRAPHY N/A 06/23/2020   Procedure: RIGHT/LEFT HEART CATH AND CORONARY ANGIOGRAPHY;  Surgeon: Jordan, Peter M, MD;  Location: [facility];  Service: Cardiovascular;  Laterality: N/A;    SHOULDER SURGERY     rotator cuff repair  SUBMUCOSAL LIFTING INJECTION  03/22/2021   Procedure: SUBMUCOSAL LIFTING INJECTION;  Surgeon: Wilhelmenia Aloha Raddle., MD;  Location: [facility];  Service: Gastroenterology;;   TRANSFORAMINAL LUMBAR INTERBODY FUSION L4-5   02/27/2020   WRIST SURGERY Right     Family History  Problem Relation Age of Onset   Diabetes Sister    Cancer Sister    Hypertension Daughter    Colon cancer Neg Hx    Liver disease Neg Hx    Pancreatic cancer Neg Hx    Esophageal cancer Neg Hx     Social History   Tobacco Use   Smoking status: Former    Current packs/day: 0.00    Types: Cigarettes    Quit date: 05/17/2011    Years since quitting: 12.9   Smokeless tobacco: Never  Vaping Use   Vaping status: Never Used  Substance Use Topics   Alcohol use: No   Drug use: No    Allergies  Allergen Reactions   Penicillins Rash    Reaction was 30years ago  Has never taken again   Ampicillin Hives   Gabapentin Other (See Comments)     Hallucinations      Review of Systems: All systems reviewed and negative except where noted in HPI.    Physical Exam:     BP 112/66   Pulse 72   Ht 5' 5 (1.651 m)   Wt 164 lb 8 oz (74.6 kg)   SpO2 98%   BMI 27.37 kg/m   Gen: awake, alert, NAD HEENT: anicteric, no pallor Abd: soft, NT/ND, +BS throughout Rectal exam-in presence of Nicole.  Hard stools.  No impaction.  Heme-negative.  Small external hemorrhoids Ext: no c/c/e Neuro: nonfocal     Latest Ref Rng & Units 03/24/2024    5:35 AM 03/23/2024    4:21 AM 12/29/2023   10:25 AM  CBC  WBC 4.0 - 10.5 K/uL 5.0  4.5  12.1   Hemoglobin 12.0 - 15.0 g/dL 87.3  88.4  87.3   Hematocrit 36.0 - 46.0 % 40.1  36.0  39.6   Platelets 150 - 400 K/uL 282  271  349       Latest Ref Rng & Units 03/24/2024    5:35 AM 03/23/2024    4:21 AM 12/29/2023   10:25 AM  CMP  Glucose 70 - 99 mg/dL 97  870  764   BUN 8 - 23 mg/dL 9  8  19    Creatinine 0.44 - 1.00 mg/dL  9.23  9.06  8.94   Sodium 135 - 145 mmol/L 142  142  141   Potassium 3.5 - 5.1 mmol/L 3.3  3.0  3.4   Chloride 98 - 111 mmol/L 107  108  106   CO2 22 - 32 mmol/L 24  25  21    Calcium  8.9 - 10.3 mg/dL 9.0  9.0  9.1   Total Protein 6.5 - 8.1 g/dL 6.1  5.7  6.6   Total Bilirubin 0.0 - 1.2 mg/dL 0.5  0.6  0.2   Alkaline Phos 38 - 126 U/L 84  77  110   AST 15 - 41 U/L 29  31  37   ALT 0 - 44 U/L 27  25  58        Rhylie Stehr,MD 04/16/2024, 11:36 AM

## 2024-04-16 NOTE — Patient Instructions (Addendum)
 _______________________________________________________  If your blood pressure at your visit was 140/90 or greater, please contact your primary care physician to follow up on this.  _______________________________________________________  If you are age 80 or older, your body mass index should be between 23-30. Your Body mass index is 27.37 kg/m. If this is out of the aforementioned range listed, please consider follow up with your Primary Care Provider.  If you are age 36 or younger, your body mass index should be between 19-25. Your Body mass index is 27.37 kg/m. If this is out of the aformentioned range listed, please consider follow up with your Primary Care Provider.   ________________________________________________________  The [facility] providers would like to encourage you to use MYCHART to communicate with providers for non-urgent requests or questions.  Due to long hold times on the telephone, sending your provider a message by [facility] may be a faster and more efficient way to get a response.  Please allow 48 business hours for a response.  Please remember that this is for non-urgent requests.  _______________________________________________________  [facility] is using a team-based approach to care.  Your team is made up of your doctor and two to three APPS. Our APPS (Nurse Practitioners and Physician Assistants) work with your physician to ensure care continuity for you. They are fully qualified to address your health concerns and develop a treatment plan. They communicate directly with your gastroenterologist to care for you. Seeing the Advanced Practice Practitioners on your physician's team can help you by facilitating care more promptly, often allowing for earlier appointments, access to diagnostic testing, procedures, and other specialty referrals.   We have sent the following medications to your pharmacy for you to pick up at your convenience: Protonix  40mg  2  times a day  Continue Pepcid  20mg  at bedtime. Continue linzess  as directed  You have been scheduled for a Barium Esophogram at [facility] (1st floor of the hospital) on 04-30-24 at 11am. Please arrive 30 minutes prior to your appointment for registration. Make certain not to have anything to eat or drink 3 hours prior to your test. If you need to reschedule for any reason, please contact radiology at [phone] to do so. __________________________________________________________________ A barium swallow is an examination that concentrates on views of the esophagus. This tends to be a double contrast exam (barium and two liquids which, when combined, create a gas to distend the wall of the oesophagus) or single contrast (non-ionic iodine based). The study is usually tailored to your symptoms so a good history is essential. Attention is paid during the study to the form, structure and configuration of the esophagus, looking for functional disorders (such as aspiration, dysphagia, achalasia, motility and reflux) EXAMINATION You may be asked to change into a gown, depending on the type of swallow being performed. A radiologist and radiographer will perform the procedure. The radiologist will advise you of the type of contrast selected for your procedure and direct you during the exam. You will be asked to stand, sit or lie in several different positions and to hold a small amount of fluid in your mouth before being asked to swallow while the imaging is performed .In some instances you may be asked to swallow barium coated marshmallows to assess the motility of a solid food bolus. The exam can be recorded as a digital or video fluoroscopy procedure. POST PROCEDURE It will take 1-2 days for the barium to pass through your system. To facilitate this, it is important, unless otherwise directed,  to increase your fluids for the next 24-48hrs and to resume your normal diet.  This test typically takes  about 30 minutes to perform. __________________________________________________________________________________    Rosine have been scheduled for an Upper GI Series at [facility]. Your appointment is on 05-07-24 at 9am. Please arrive 30 minutes prior to your test for registration. Make sure not to eat or drink anything after midnight on the night before your test. If you need to reschedule, please call radiology at [phone]. ________________________________________________________________ An upper GI series uses x rays to help diagnose problems of the upper GI tract, which includes the esophagus, stomach, and duodenum. The duodenum is the first part of the small intestine. An upper GI series is conducted by a radiology technologist or a radiologist--a doctor who specializes in x-ray imaging--at a hospital or outpatient center. While sitting or standing in front of an x-ray machine, the patient drinks barium liquid, which is often white and has a chalky consistency and taste. The barium liquid coats the lining of the upper GI tract and makes signs of disease show up more clearly on x rays. X-ray video, called fluoroscopy, is used to view the barium liquid moving through the esophagus, stomach, and duodenum. Additional x rays and fluoroscopy are performed while the patient lies on an x-ray table. To fully coat the upper GI tract with barium liquid, the technologist or radiologist may press on the abdomen or ask the patient to change position. Patients hold still in various positions, allowing the technologist or radiologist to take x rays of the upper GI tract at different angles. If a technologist conducts the upper GI series, a radiologist will later examine the images to look for problems.  This test typically takes about 1 hour to complete. __________________________________________________________________

## 2024-04-17 ENCOUNTER — Encounter: Admitting: Podiatry

## 2024-04-17 DIAGNOSIS — E785 Hyperlipidemia, unspecified: Secondary | ICD-10-CM | POA: Insufficient documentation

## 2024-04-18 ENCOUNTER — Encounter: Payer: Self-pay | Admitting: Cardiology

## 2024-04-18 ENCOUNTER — Ambulatory Visit

## 2024-04-18 ENCOUNTER — Inpatient Hospital Stay
Admission: RE | Admit: 2024-04-18 | Discharge: 2024-04-18 | Disposition: A | Discharge status: Home / Self Care | Payer: Self-pay | Source: Ambulatory Visit | Attending: Vascular Surgery | Admitting: Vascular Surgery

## 2024-04-18 ENCOUNTER — Ambulatory Visit: Attending: Cardiology | Admitting: Cardiology

## 2024-04-18 ENCOUNTER — Other Ambulatory Visit (HOSPITAL_COMMUNITY): Payer: Self-pay | Admitting: Vascular Surgery

## 2024-04-18 VITALS — BP 142/66 | HR 68 | Ht 65.0 in | Wt 159.2 lb

## 2024-04-18 DIAGNOSIS — I6529 Occlusion and stenosis of unspecified carotid artery: Secondary | ICD-10-CM

## 2024-04-18 DIAGNOSIS — I6523 Occlusion and stenosis of bilateral carotid arteries: Secondary | ICD-10-CM

## 2024-04-18 DIAGNOSIS — I2510 Atherosclerotic heart disease of native coronary artery without angina pectoris: Secondary | ICD-10-CM | POA: Diagnosis not present

## 2024-04-18 DIAGNOSIS — I350 Nonrheumatic aortic (valve) stenosis: Secondary | ICD-10-CM | POA: Diagnosis not present

## 2024-04-18 DIAGNOSIS — E088 Diabetes mellitus due to underlying condition with unspecified complications: Secondary | ICD-10-CM

## 2024-04-18 DIAGNOSIS — G4733 Obstructive sleep apnea (adult) (pediatric): Secondary | ICD-10-CM | POA: Diagnosis not present

## 2024-04-18 DIAGNOSIS — I10 Essential (primary) hypertension: Secondary | ICD-10-CM

## 2024-04-18 DIAGNOSIS — G4730 Sleep apnea, unspecified: Secondary | ICD-10-CM

## 2024-04-18 NOTE — Patient Instructions (Signed)

## 2024-04-18 NOTE — Progress Notes (Signed)
 Cardiology Office Note:    Date:  04/18/2024   ID:  Deborah Hutchinson, DOB 10-Sep-1945, MRN [id number]  PCP:  Gable Cambric, MD  Cardiologist:  Jennifer JONELLE Crape, MD   Referring MD: Gable Cambric, MD    ASSESSMENT:    1. Bilateral carotid artery stenosis   2. Coronary artery disease involving native coronary artery of native heart without angina pectoris   3. Carotid artery stenosis, symptomatic, bilateral   4. Essential hypertension   5. Moderate aortic stenosis   6. OSA (obstructive sleep apnea)   7. Sleep apnea, unspecified type   8. Diabetes mellitus due to underlying condition with unspecified complications (HCC)    PLAN:    In order of problems listed above:  Bilateral carotid artery stenosis: Hospital records were reviewed and notes were reviewed.  She has not had any stroke or any such issues.  She will be referred quickly to vascular surgery for evaluation.  I reviewed those records extensively and questions were answered to her satisfaction. Essential hypertension: Blood pressure stable and diet was emphasized.  I am not going to be too aggressive with blood pressure control because of her carotid evaluation issues. Mixed dyslipidemia: On lipid-lowering medications and I reviewed lipids and they are at goal. Diabetes mellitus: Managed by primary care.  Diet emphasized. Moderate aortic stenosis: Symptoms education was given to the patient. She will be seen in follow-up appointment in 6 months or earlier if she has any concerns.   Medication Adjustments/Labs and Tests Ordered: Current medicines are reviewed at length with the patient today.  Concerns regarding medicines are outlined above.  Orders Placed This Encounter  Procedures   Ambulatory referral to Vascular Surgery   No orders of the defined types were placed in this encounter.    No chief complaint on file.    History of Present Illness:    Deborah Hutchinson is a 78 y.o. female.  Patient has past  medical history of essential hypertension, mixed dyslipidemia, diabetes mellitus, moderate aortic stenosis and was recently in the hospital.  She was diagnosed to have bilateral internal carotid artery stenosis.  She denies any history of stroke or any such issues.  At the time of my evaluation, the patient is alert awake oriented and in no distress. Her daughter accompanies her for this visit. Past Medical History:  Diagnosis Date   Aftercare following surgery 03/26/2020   Allergy 03/29/2021   Angina pectoris 06/17/2020   Anxiety    Arthritis    all over   Asthma    Follows w/ Dr. Marina, pulmonologist.   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   Chronic kidney disease    Chronic obstructive pulmonary disease (HCC) 02/09/2017   Formatting of this note might be different from the original. Last Assessment & Plan:  I will review her PFTs after I obtain records from her pulmonologist. Based on this we will decide if she needs inhalers, subjectively they do not seem to have helped her at all. She may benefit from a pulmonary rehabilitation program Formatting of this note might be different from the original. Last Assessment    COPD (chronic obstructive pulmonary  disease) (HCC) 02/09/2017   Coronary artery disease involving native coronary artery of native heart without angina pectoris 06/10/2015   Depression    Diabetes mellitus due to underlying condition with unspecified complications (HCC) 06/10/2015   DM2 (diabetes mellitus, type 2) (HCC) 03/23/2024   Dyslipidemia 06/10/2015   Esophageal dysphagia 09/02/2019   Formatting of this note might be different from the original.   Added automatically from request for surgery [id number]     Essential hypertension 06/10/2015   Gastroparesis 09/04/2019   GERD (gastroesophageal reflux disease)    H/O hiatal hernia    Headache 09/24/2018   Heart murmur    Heme positive stool 10/06/2022   History of COPD 03/23/2024   History of essential hypertension 03/23/2024   HLD (hyperlipidemia)    Hyperlipemia    Hypertension    Iron deficiency anemia 09/25/2020   Lumbar radicular pain 08/08/2022   Lumbar radiculopathy 12/18/2020   Mild aortic stenosis 05/16/2019   Mixed dyslipidemia 06/10/2015   Nausea and vomiting 06/16/2020   occasional   Neurogenic claudication 12/18/2020   OSA (obstructive sleep apnea) 02/09/2017   Pain and swelling of toe of right foot 07/15/2016   Palpitations 06/02/2016   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 Mild pancolonic diverticulosis. Highly redundant colon.    DILATION AND CURETTAGE OF UTERUS     one   ENDOSCOPIC MUCOSAL RESECTION N/A 03/22/2021   Procedure: ENDOSCOPIC MUCOSAL RESECTION;  Surgeon: Wilhelmenia Aloha Raddle., MD;  Location: [facility];  Service: Gastroenterology;  Laterality: N/A;   ENTEROSCOPY N/A 10/06/2022   Procedure: ENTEROSCOPY;  Surgeon: Charlanne Groom, MD;  Location: [facility];  Service: Gastroenterology;  Laterality: N/A;   ESOPHAGOGASTRODUODENOSCOPY  08/17/2015   Moderate hiatal hernia. Otherwise noraml EGD.    ESOPHAGOGASTRODUODENOSCOPY  09/03/2019   [facility]    ESOPHAGOGASTRODUODENOSCOPY (EGD) WITH PROPOFOL  N/A 03/22/2021   Procedure: ESOPHAGOGASTRODUODENOSCOPY (EGD) WITH PROPOFOL ;  Surgeon: Wilhelmenia Aloha Raddle., MD;  Location: [facility];  Service: Gastroenterology;  Laterality: N/A;   EUS  05/19/2011   Procedure: UPPER ENDOSCOPIC ULTRASOUND (EUS) LINEAR;  Surgeon: Toribio Cedar, MD;  Location: [facility];  Service: Endoscopy;  Laterality: N/A;  radial linear    GIVENS CAPSULE STUDY N/A 10/06/2022   Procedure: GIVENS CAPSULE STUDY;  Surgeon: Charlanne Groom, MD;  Location: [facility];  Service: Gastroenterology;  Laterality: N/A;   HEMOSTASIS CLIP PLACEMENT  03/22/2021   Procedure: HEMOSTASIS CLIP PLACEMENT;  Surgeon: Wilhelmenia Aloha Raddle., MD;  Location: [facility];  Service: Gastroenterology;;   HERNIA REPAIR  05/09/2020   HOT HEMOSTASIS N/A 10/06/2022   Procedure: HOT HEMOSTASIS (ARGON PLASMA COAGULATION/BICAP);  Surgeon: Charlanne Groom, MD;  Location: [facility];  Service: Gastroenterology;  Laterality: N/A;   HYSTEROTOMY     KNEE SURGERY     LAPAROSCOPIC PARAESOPHAGEAL HERNIA REPAIR  12/07/2019   LEFT HEART CATH AND CORONARY ANGIOGRAPHY     LEFT HEART CATH AND CORONARY ANGIOGRAPHY N/A 05/08/2019   Procedure: LEFT HEART CATH AND  CORONARY ANGIOGRAPHY;  Surgeon: Verlin Lonni BIRCH, MD;  Location: [facility];  Service: Cardiovascular;  Laterality: N/A;   right thumb     tendon repair   RIGHT/LEFT HEART CATH AND CORONARY ANGIOGRAPHY N/A 06/23/2020   Procedure: RIGHT/LEFT HEART CATH AND CORONARY ANGIOGRAPHY;  Surgeon: Jordan, Peter M, MD;  [facility];  Service: Cardiovascular;  Laterality: N/A;   SHOULDER SURGERY     rotator cuff repair   SUBMUCOSAL LIFTING INJECTION  03/22/2021   Procedure: SUBMUCOSAL LIFTING INJECTION;  Surgeon: Wilhelmenia Aloha Raddle., MD;  Location: [facility];  Service: Gastroenterology;;   TRANSFORAMINAL LUMBAR INTERBODY FUSION L4-5   02/27/2020   WRIST SURGERY Right      Current Medications: Active Medications[1]   Allergies:   Penicillins, Ampicillin, and Gabapentin   Social History   Socioeconomic History   Marital status: Single    Spouse name: Not on file   Number of children: 3   Years of education: Not on file   Highest education level: Not on file  Occupational History   Occupation: retired  Tobacco Use   Smoking status: Former    Current packs/day: 0.00    Average packs/day: 1.0 packs/day    Types: Cigarettes    Quit date: 05/17/2011    Years since quitting: 12.9   Smokeless tobacco: Never  Vaping Use   Vaping status: Never Used  Substance and Sexual Activity   Alcohol use: No   Drug use: No   Sexual activity: Not on file  Other Topics Concern   Not on file  Social History Narrative   Right handed   Social Drivers of Health   Tobacco Use: Medium Risk (04/18/2024)   Patient History    Smoking Tobacco Use: Former    Smokeless Tobacco Use: Never    Passive Exposure: Not on Actuary Strain: Not on file  Food Insecurity: No Food Insecurity (03/23/2024)   Epic    Worried About Programme Researcher, Broadcasting/film/video in the Last Year: Never true    Ran Out of Food in the Last Year: Never true  Transportation Needs: No Transportation Needs (03/23/2024)   Epic    Lack of Transportation (Medical): No    Lack of Transportation (Non-Medical): No  Physical Activity: Not on file  Stress: Not on file  Social Connections: Not on file  Depression (PHQ2-9): Low Risk (12/29/2023)   Depression (PHQ2-9)    PHQ-2 Score: 0  Alcohol Screen: Not on file  Housing: Low Risk (03/23/2024)   Epic    Unable to Pay for Housing in the Last Year: No    Number of Times Moved in the Last Year: 0    Homeless in the Last Year: No  Utilities: Not At Risk (03/23/2024)   Epic    Threatened with loss of utilities: No  Health Literacy: Not on file     Family History: The patient's family history includes Cancer in her sister; Diabetes in her sister;  Hypertension in her daughter. There is no history of Colon cancer, Liver disease, Pancreatic cancer, or Esophageal cancer.  ROS:   Please see the history of present illness.    All other systems reviewed and are negative.  EKGs/Labs/Other Studies Reviewed:    The following studies were reviewed today: I discussed my findings with the patient at length   Recent Labs: 03/23/2024: TSH 0.726 03/24/2024: ALT 27; BUN 9; Creatinine, Ser 0.76; Hemoglobin 12.6; Magnesium 1.8; Platelets 282; Potassium 3.3; Sodium 142  Recent Lipid Panel    Component Value Date/Time   CHOL 149 03/24/2024 0535   CHOL 153 12/23/2022 0923   TRIG 52 03/24/2024 0535   HDL 86 03/24/2024 0535   HDL 87 12/23/2022 0923   CHOLHDL 1.7 03/24/2024 0535   VLDL 10 03/24/2024 0535   LDLCALC 53 03/24/2024 0535   LDLCALC 54 12/23/2022 0923    Physical Exam:    VS:  BP (!) 142/66   Pulse 68   Ht 5' 5 (1.651 m)   Wt 159 lb 4 oz (72.2 kg)   SpO2 98%   BMI 26.50 kg/m     Wt Readings from Last 3 Encounters:  04/18/24 159 lb 4 oz (72.2 kg)  04/16/24 164 lb 8 oz (74.6 kg)  03/24/24 158 lb 11.2 oz (72 kg)     GEN: Patient is in no acute distress HEENT: Normal NECK: No JVD; No carotid bruits LYMPHATICS: No lymphadenopathy CARDIAC: Hear sounds regular, 2/6 systolic murmur at the apex. RESPIRATORY:  Clear to auscultation without rales, wheezing or rhonchi  ABDOMEN: Soft, non-tender, non-distended MUSCULOSKELETAL:  No edema; No deformity  SKIN: Warm and dry NEUROLOGIC:  Alert and oriented x 3 PSYCHIATRIC:  Normal affect   Signed, Jennifer JONELLE Crape, MD  04/18/2024 9:23 AM    [facility]     [1]  Current Meds  Medication Sig   Accu-Chek FastClix Lancets MISC Apply 102 each topically 2 (two) times daily.   ACCU-CHEK GUIDE TEST test strip 1 each by Other route as needed for other.   albuterol  (PROVENTIL  HFA;VENTOLIN  HFA) 108 (90 Base) MCG/ACT inhaler Inhale 2 puffs into the lungs every  6 (six) hours as needed for wheezing or shortness of breath.   albuterol  (PROVENTIL ) (2.5 MG/3ML) 0.083% nebulizer solution Take 2.5 mg by nebulization as needed for shortness of breath or wheezing.   amLODipine  (NORVASC ) 10 MG tablet Take 10 mg by mouth daily.   aspirin  EC 81 MG tablet Take 81 mg by mouth daily. Swallow whole.   benzonatate (TESSALON) 200 MG capsule Take 200 mg by mouth at bedtime as needed for cough.   buprenorphine (BUTRANS) 10 MCG/HR PTWK Place 1 patch onto the skin once a week.   buPROPion  (WELLBUTRIN  XL) 300 MG 24 hr tablet Take 300 mg by mouth in the morning.   CALCIUM  PO Take 1 tablet by mouth daily.   donepezil  (ARICEPT ) 10 MG tablet Take 10 mg by mouth daily at 12 noon.   EPINEPHrine  0.3 mg/0.3 mL IJ SOAJ injection Inject 0.3 mg into the muscle as needed for anaphylaxis.   escitalopram  (LEXAPRO ) 20 MG tablet Take 20 mg by mouth at bedtime.   famotidine  (PEPCID ) 40 MG tablet Take 1 tablet (40 mg total) by mouth 2 (two) times daily.   Ferrous Sulfate (IRON PO) Take 1 tablet by mouth in the morning.   fluticasone  (FLONASE ) 50 MCG/ACT nasal spray Place 2 sprays into both nostrils daily.   Fluticasone -Umeclidin-Vilant (TRELEGY ELLIPTA) 200-62.5-25 MCG/INH AEPB Inhale 2 puffs into the lungs daily.   hydrocortisone  (ANUCORT-HC ) 25 MG suppository INSERT RECTALLY AT BEDTIME FOR 5 NIGHTS (Patient taking differently: Place 25 mg rectally daily as needed for hemorrhoids.)   hydrocortisone -pramoxine (PROCTOFOAM-HC) rectal foam Place 1 applicator rectally 2 (two) times daily as needed for hemorrhoids or anal itching.   isosorbide  mononitrate (IMDUR ) 120 MG 24 hr tablet Take 1 tablet by mouth once daily   JARDIANCE  10 MG TABS tablet Take 10 mg by mouth daily.  levocetirizine (XYZAL ) 5 MG tablet TAKE 1 TABLET BY MOUTH ONCE DAILY IN THE EVENING   linaclotide  (LINZESS ) 72 MCG capsule Take 72 mcg by mouth as needed (constipation).   methocarbamol (ROBAXIN) 500 MG tablet Take 500 mg by  mouth 3 (three) times daily. (Patient taking differently: Take 500 mg by mouth as needed.)   montelukast  (SINGULAIR ) 10 MG tablet Take 10 mg by mouth at bedtime.    Multiple Vitamin (MULTIVITAMIN) capsule Take 1 capsule by mouth in the morning.  Women's Multivitamin Gummy   nitroGLYCERIN  (NITROSTAT ) 0.4 MG SL tablet DISSOLVE 1 TABLET UNDER THE TONGUE AS NEEDED FOR CHEST PAIN EVERY 5 MINUTES UP TO 3 TIMES. IF NO RELIEF CALL 911. *NEW PRESCRIPTION REQUEST* (Patient taking differently: Place 0.4 mg under the tongue every 5 (five) minutes as needed for chest pain.)   nystatin cream (MYCOSTATIN) Apply 1 Application topically 2 (two) times daily as needed (skin irritation/rash).   oxybutynin  (DITROPAN ) 5 MG tablet Take 1 tablet (5 mg total) by mouth every 8 (eight) hours as needed for bladder spasms.   OZEMPIC, 2 MG/DOSE, 8 MG/3ML SOPN Inject 2 mg into the skin once a week.   pantoprazole  (PROTONIX ) 40 MG tablet Take 1 tablet (40 mg total) by mouth 2 (two) times daily.   rosuvastatin  (CRESTOR ) 20 MG tablet Take 1 tablet (20 mg total) by mouth daily.   traZODone  (DESYREL ) 50 MG tablet Take 100 mg by mouth at bedtime.   vitamin B-12 (CYANOCOBALAMIN) 500 MCG tablet Take 500 mcg by mouth in the morning.   Vitamin D , Ergocalciferol , (DRISDOL) 1.25 MG (50000 UNIT) CAPS capsule Take 50,000 Units by mouth every 30 (thirty) days.   [DISCONTINUED] OZEMPIC, 1 MG/DOSE, 4 MG/3ML SOPN Inject 1 mg into the skin once a week.

## 2024-04-19 ENCOUNTER — Encounter: Payer: Self-pay | Admitting: Vascular Surgery

## 2024-04-19 ENCOUNTER — Ambulatory Visit: Attending: Vascular Surgery | Admitting: Vascular Surgery

## 2024-04-19 VITALS — BP 140/79 | HR 72 | Temp 98.1°F | Resp 20 | Ht 65.0 in | Wt 161.8 lb

## 2024-04-19 DIAGNOSIS — I6523 Occlusion and stenosis of bilateral carotid arteries: Secondary | ICD-10-CM

## 2024-04-19 NOTE — Progress Notes (Signed)
 Patient ID: Deborah Hutchinson, female   DOB: 05/04/1946, 78 y.o.   MRN: [id number]  Reason for Consult: New Patient (Initial Visit)   Referred by Gable Cambric, MD  Subjective:     HPI Deborah Hutchinson is a 78 y.o. female who was recently admitted for presyncope and headaches.  She denies any recent strokes or strokelike symptoms.  She denies any one-sided weakness, numbness, amaurosis or speech issues.  CT scan while she was in the hospital demonstrated a possible 70% stenosis but this was likely due to motion artifact and tortuosity.  The duplex demonstrated minimal 1 to 39% stenosis bilaterally which is the lowest grade of ultrasound criteria.  She is having vision disturbances and seeing orange/yellow shades associated with morning headaches and neck pain.  Past Medical History:  Diagnosis Date   Aftercare following surgery 03/26/2020   Allergy 03/29/2021   Angina pectoris 06/17/2020   Anxiety    Arthritis    all over   Asthma    Follows w/ Dr. Marina, pulmonologist.   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   Chronic kidney disease    Chronic obstructive pulmonary disease (HCC) 02/09/2017   Formatting of this note might be different from the original. Last Assessment & Plan:  I will review her PFTs after I obtain records from her pulmonologist. Based on this we will decide if she needs inhalers, subjectively they do not seem to have helped her at all. She may benefit from a pulmonary rehabilitation program Formatting of this note might be different from the original. Last Assessment    COPD (chronic obstructive pulmonary disease) (HCC) 02/09/2017   Coronary  artery disease involving native coronary artery of native heart without angina pectoris 06/10/2015   Depression    Diabetes mellitus due to underlying condition with unspecified complications (HCC) 06/10/2015   DM2 (diabetes mellitus, type 2) (HCC) 03/23/2024   Dyslipidemia 06/10/2015   Esophageal dysphagia 09/02/2019   Formatting of this note might be different from the original.  Added automatically from request for surgery [id number]     Essential hypertension 06/10/2015   Gastroparesis 09/04/2019   GERD (gastroesophageal reflux disease)    H/O hiatal hernia    Headache 09/24/2018   Heart murmur    Heme positive stool 10/06/2022   History of COPD 03/23/2024   History of essential hypertension 03/23/2024   HLD (hyperlipidemia)    Hyperlipemia    Hypertension    Iron deficiency anemia 09/25/2020   Lumbar radicular pain 08/08/2022   Lumbar radiculopathy 12/18/2020   Mild aortic stenosis 05/16/2019   Mixed dyslipidemia 06/10/2015   Nausea and vomiting 06/16/2020   occasional   Neurogenic claudication 12/18/2020   OSA (obstructive sleep apnea) 02/09/2017   Pain and swelling of toe of right foot 07/15/2016   Palpitations 06/02/2016   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   Preop cardiovascular exam 09/12/2022   S/P lumbar fusion 03/26/2020   Shortness of breath    on  excertion   Sleep apnea    Vertigo 03/22/2024   Family History  Problem Relation Age of Onset   Diabetes Sister    Cancer Sister  Hypertension Daughter    Colon cancer Neg Hx    Liver disease Neg Hx    Pancreatic cancer Neg Hx    Esophageal cancer Neg Hx    Past Surgical History:  Procedure Laterality Date   ABDOMINAL HYSTERECTOMY     BIOPSY  03/22/2021   Procedure: BIOPSY;  Surgeon: Wilhelmenia Aloha Raddle., MD;  Location: [facility];  Service: Gastroenterology;;   BREAST SURGERY     begin mass,left  breast   COLONOSCOPY  08/17/2015   Colonic polyp status post polypectomy. Mild pancolonic  diverticulosis. Highly redundant colon.    DILATION AND CURETTAGE OF UTERUS     one   ENDOSCOPIC MUCOSAL RESECTION N/A 03/22/2021   Procedure: ENDOSCOPIC MUCOSAL RESECTION;  Surgeon: Wilhelmenia Aloha Raddle., MD;  Location: [facility];  Service: Gastroenterology;  Laterality: N/A;   ENTEROSCOPY N/A 10/06/2022   Procedure: ENTEROSCOPY;  Surgeon: Charlanne Groom, MD;  Location: [facility];  Service: Gastroenterology;  Laterality: N/A;   ESOPHAGOGASTRODUODENOSCOPY  08/17/2015   Moderate hiatal hernia. Otherwise noraml EGD.    ESOPHAGOGASTRODUODENOSCOPY  09/03/2019   [facility]   ESOPHAGOGASTRODUODENOSCOPY (EGD) WITH PROPOFOL  N/A 03/22/2021   Procedure: ESOPHAGOGASTRODUODENOSCOPY (EGD) WITH PROPOFOL ;  Surgeon: Wilhelmenia Aloha Raddle., MD;  Location: [facility];  Service: Gastroenterology;  Laterality: N/A;   EUS  05/19/2011   Procedure: UPPER ENDOSCOPIC ULTRASOUND (EUS) LINEAR;  Surgeon: Toribio Cedar, MD;  Location: [facility];  Service: Endoscopy;  Laterality: N/A;  radial linear    GIVENS CAPSULE STUDY N/A 10/06/2022   Procedure: GIVENS CAPSULE STUDY;  Surgeon: Charlanne Groom, MD;  Location: [facility];  Service: Gastroenterology;  Laterality: N/A;   HEMOSTASIS CLIP PLACEMENT  03/22/2021   Procedure: HEMOSTASIS CLIP PLACEMENT;  Surgeon: Wilhelmenia Aloha Raddle., MD;  Location: [facility];  Service: Gastroenterology;;   HERNIA REPAIR  05/09/2020   HOT HEMOSTASIS N/A 10/06/2022   Procedure: HOT HEMOSTASIS (ARGON PLASMA COAGULATION/BICAP);  Surgeon: Charlanne Groom, MD;  Location: [facility];  Service: Gastroenterology;  Laterality: N/A;   HYSTEROTOMY     KNEE SURGERY     LAPAROSCOPIC PARAESOPHAGEAL HERNIA REPAIR  12/07/2019   LEFT HEART CATH AND CORONARY ANGIOGRAPHY     LEFT HEART CATH AND CORONARY ANGIOGRAPHY N/A 05/08/2019   Procedure: LEFT HEART CATH AND CORONARY ANGIOGRAPHY;  Surgeon: Verlin Lonni BIRCH, MD;  Location: [facility];  Service:  Cardiovascular;  Laterality: N/A;   right thumb     tendon repair   RIGHT/LEFT HEART CATH AND CORONARY ANGIOGRAPHY N/A 06/23/2020   Procedure: RIGHT/LEFT HEART CATH AND CORONARY ANGIOGRAPHY;  Surgeon: Jordan, Peter M, MD;  Location: [facility];  Service: Cardiovascular;  Laterality: N/A;   SHOULDER SURGERY     rotator cuff repair   SUBMUCOSAL LIFTING INJECTION  03/22/2021   Procedure: SUBMUCOSAL LIFTING INJECTION;  Surgeon: Wilhelmenia Aloha Raddle., MD;  Location: [facility];  Service: Gastroenterology;;   TRANSFORAMINAL LUMBAR INTERBODY FUSION L4-5   02/27/2020   WRIST SURGERY Right     Short Social History:  Social History   Tobacco Use   Smoking status: Former    Current packs/day: 0.00    Average packs/day: 1.0 packs/day    Types: Cigarettes    Quit date: 05/17/2011    Years since quitting: 12.9   Smokeless tobacco: Never  Substance Use Topics   Alcohol use: No    Allergies[1]  Current Outpatient Medications  Medication Sig Dispense Refill   Accu-Chek FastClix Lancets MISC Apply 102 each topically 2 (two) times daily.  ACCU-CHEK GUIDE TEST test strip 1 each by Other route as needed for other.     albuterol  (PROVENTIL  HFA;VENTOLIN  HFA) 108 (90 Base) MCG/ACT inhaler Inhale 2 puffs into the lungs every 6 (six) hours as needed for wheezing or shortness of breath. 3 Inhaler 3   albuterol  (PROVENTIL ) (2.5 MG/3ML) 0.083% nebulizer solution Take 2.5 mg by nebulization as needed for shortness of breath or wheezing.     amLODipine  (NORVASC ) 10 MG tablet Take 10 mg by mouth daily.     aspirin  EC 81 MG tablet Take 81 mg by mouth daily. Swallow whole.     benzonatate (TESSALON) 200 MG capsule Take 200 mg by mouth at bedtime as needed for cough.     buprenorphine (BUTRANS) 10 MCG/HR PTWK Place 1 patch onto the skin once a week.     buPROPion  (WELLBUTRIN  XL) 300 MG 24 hr tablet Take 300 mg by mouth in the morning.     CALCIUM  PO Take 1 tablet by mouth daily.     donepezil   (ARICEPT ) 10 MG tablet Take 10 mg by mouth daily at 12 noon.     EPINEPHrine  0.3 mg/0.3 mL IJ SOAJ injection Inject 0.3 mg into the muscle as needed for anaphylaxis.     escitalopram  (LEXAPRO ) 20 MG tablet Take 20 mg by mouth at bedtime.     famotidine  (PEPCID ) 40 MG tablet Take 1 tablet (40 mg total) by mouth 2 (two) times daily. 180 tablet 1   Ferrous Sulfate (IRON PO) Take 1 tablet by mouth in the morning.     fluticasone  (FLONASE ) 50 MCG/ACT nasal spray Place 2 sprays into both nostrils daily.     Fluticasone -Umeclidin-Vilant (TRELEGY ELLIPTA) 200-62.5-25 MCG/INH AEPB Inhale 2 puffs into the lungs daily.     hydrocortisone  (ANUCORT-HC ) 25 MG suppository INSERT RECTALLY AT BEDTIME FOR 5 NIGHTS (Patient taking differently: Place 25 mg rectally daily as needed for hemorrhoids.) 5 suppository 0   hydrocortisone -pramoxine (PROCTOFOAM-HC) rectal foam Place 1 applicator rectally 2 (two) times daily as needed for hemorrhoids or anal itching.     isosorbide  mononitrate (IMDUR ) 120 MG 24 hr tablet Take 1 tablet by mouth once daily 60 tablet 6   JARDIANCE  10 MG TABS tablet Take 10 mg by mouth daily.     levocetirizine (XYZAL ) 5 MG tablet TAKE 1 TABLET BY MOUTH ONCE DAILY IN THE EVENING 30 tablet 0   linaclotide  (LINZESS ) 72 MCG capsule Take 72 mcg by mouth as needed (constipation).     methocarbamol (ROBAXIN) 500 MG tablet Take 500 mg by mouth 3 (three) times daily. (Patient taking differently: Take 500 mg by mouth as needed.)     montelukast  (SINGULAIR ) 10 MG tablet Take 10 mg by mouth at bedtime.      Multiple Vitamin (MULTIVITAMIN) capsule Take 1 capsule by mouth in the morning.  Women's Multivitamin Gummy     nitroGLYCERIN  (NITROSTAT ) 0.4 MG SL tablet DISSOLVE 1 TABLET UNDER THE TONGUE AS NEEDED FOR CHEST PAIN EVERY 5 MINUTES UP TO 3 TIMES. IF NO RELIEF CALL 911. *NEW PRESCRIPTION REQUEST* (Patient taking differently: Place 0.4 mg under the tongue every 5 (five) minutes as needed for chest pain.) 75  tablet 10   nystatin cream (MYCOSTATIN) Apply 1 Application topically 2 (two) times daily as needed (skin irritation/rash).     oxybutynin  (DITROPAN ) 5 MG tablet Take 1 tablet (5 mg total) by mouth every 8 (eight) hours as needed for bladder spasms. 30 tablet 1   OZEMPIC, 2 MG/DOSE, 8 MG/3ML  SOPN Inject 2 mg into the skin once a week.     pantoprazole  (PROTONIX ) 40 MG tablet Take 1 tablet (40 mg total) by mouth 2 (two) times daily. 180 tablet 1   rosuvastatin  (CRESTOR ) 20 MG tablet Take 1 tablet (20 mg total) by mouth daily. 90 tablet 3   traZODone  (DESYREL ) 50 MG tablet Take 100 mg by mouth at bedtime.     vitamin B-12 (CYANOCOBALAMIN) 500 MCG tablet Take 500 mcg by mouth in the morning.     Vitamin D , Ergocalciferol , (DRISDOL) 1.25 MG (50000 UNIT) CAPS capsule Take 50,000 Units by mouth every 30 (thirty) days.     No current facility-administered medications for this visit.    REVIEW OF SYSTEMS  All other systems were reviewed and are negative     Objective:  Objective   Vitals:   04/19/24 1540 04/19/24 1542  BP: 134/77 (!) 140/79  Pulse: 72   Resp: 20   Temp: 98.1 F (36.7 C)   TempSrc: Temporal   SpO2: 98%   Weight: 161 lb 12.8 oz (73.4 kg)   Height: 5' 5 (1.651 m)    Body mass index is 26.92 kg/m.  Physical Exam General: no acute distress Cardiac: hemodynamically stable Extremities: no edema, cyanosis or wounds Vascular:   Right: Palpable radial  Left: Palpable radial  Data: Carotid duplex Right Carotid: Velocities in the right ICA are consistent with a 1-39%  stenosis.                Unable to match stenosis found by CTA at outside hospital.                 Vessels are severely tortuous throughout.   Left Carotid: Velocities in the left ICA are consistent with a 1-39%  stenosis.               Unable to match stenosis found by CTA at outside hospital.  Vessels               are severely tortuous throughout.   Vertebrals:  Bilateral vertebral arteries  demonstrate antegrade flow.  Subclavians: Normal flow hemodynamics were seen in bilateral subclavian               arteries.    CTA head and neck from her admission in November was reviewed Minimal stenosis of bilateral ICAs, there in question appears to be related to motion artifact and tortuosity.     Assessment/Plan:   Deborah Hutchinson is a 78 y.o. female who was recently admitted with presyncope and headaches.  She had a stroke workup which demonstrated a possible 70% stenosis of the ICAs but this appears an overcall and likely due to motion artifact and tortuosity.  She had a carotid duplex at that time that demonstrated minimal 1 to 39% stenosis bilaterally.  She is asymptomatic and I explained that I do not think her symptoms are related to any amount of carotid stenosis.  Her visual disturbances, headaches and neck pain sound to correlate with migraines which are not caused by carotid disease. Plan for follow-up in 12 months with a repeat carotid duplex.  If stable at that time can stop surveillance. Referred to neurology for presyncope and possible migraines.   Norman GORMAN Serve MD [facility]     [1]  Allergies Allergen Reactions   Penicillins Rash    Reaction was 30years ago  Has never taken again   Ampicillin Hives  Gabapentin Other (See Comments)     Hallucinations

## 2024-04-22 ENCOUNTER — Ambulatory Visit

## 2024-04-22 DIAGNOSIS — M6281 Muscle weakness (generalized): Secondary | ICD-10-CM

## 2024-04-22 DIAGNOSIS — M5459 Other low back pain: Secondary | ICD-10-CM

## 2024-04-22 DIAGNOSIS — R279 Unspecified lack of coordination: Secondary | ICD-10-CM

## 2024-04-22 DIAGNOSIS — M62838 Other muscle spasm: Secondary | ICD-10-CM

## 2024-04-22 DIAGNOSIS — M25551 Pain in right hip: Secondary | ICD-10-CM
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
## 2024-04-23 ENCOUNTER — Encounter: Payer: Self-pay | Admitting: Physical Therapy

## 2024-04-23 ENCOUNTER — Ambulatory Visit: Admitting: Physical Therapy

## 2024-04-23 DIAGNOSIS — M6281 Muscle weakness (generalized): Secondary | ICD-10-CM | POA: Diagnosis not present

## 2024-04-23 DIAGNOSIS — M5459 Other low back pain: Secondary | ICD-10-CM

## 2024-04-23 DIAGNOSIS — M25551 Pain in right hip: Secondary | ICD-10-CM

## 2024-04-23 DIAGNOSIS — R279 Unspecified lack of coordination: Secondary | ICD-10-CM
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
## 2024-04-30 ENCOUNTER — Other Ambulatory Visit (HOSPITAL_COMMUNITY)

## 2024-04-30 ENCOUNTER — Ambulatory Visit: Payer: Self-pay | Admitting: Cardiology

## 2024-04-30 ENCOUNTER — Ambulatory Visit

## 2024-04-30 ENCOUNTER — Other Ambulatory Visit (HOSPITAL_BASED_OUTPATIENT_CLINIC_OR_DEPARTMENT_OTHER): Payer: Self-pay | Admitting: Internal Medicine

## 2024-04-30 DIAGNOSIS — R55 Syncope and collapse: Secondary | ICD-10-CM | POA: Diagnosis not present

## 2024-04-30 DIAGNOSIS — Z1231 Encounter for screening mammogram for malignant neoplasm of breast: Secondary | ICD-10-CM

## 2024-04-30 DIAGNOSIS — E2839 Other primary ovarian failure: Secondary | ICD-10-CM

## 2024-05-01 ENCOUNTER — Encounter: Admitting: Podiatry

## 2024-05-07 ENCOUNTER — Other Ambulatory Visit (HOSPITAL_COMMUNITY)

## 2024-05-08 ENCOUNTER — Ambulatory Visit (HOSPITAL_BASED_OUTPATIENT_CLINIC_OR_DEPARTMENT_OTHER)
Admission: RE | Admit: 2024-05-08 | Discharge: 2024-05-08 | Disposition: A | Discharge status: Home / Self Care | Source: Ambulatory Visit | Attending: Internal Medicine | Admitting: Internal Medicine

## 2024-05-08 DIAGNOSIS — Z1231 Encounter for screening mammogram for malignant neoplasm of breast: Secondary | ICD-10-CM

## 2024-05-08 DIAGNOSIS — E2839 Other primary ovarian failure: Secondary | ICD-10-CM | POA: Diagnosis not present

## 2024-05-10 ENCOUNTER — Ambulatory Visit: Admitting: Physical Therapy

## 2024-05-14 ENCOUNTER — Ambulatory Visit: Attending: Nurse Practitioner

## 2024-05-16 ENCOUNTER — Ambulatory Visit

## 2024-05-21 ENCOUNTER — Ambulatory Visit: Attending: Nurse Practitioner

## 2024-05-21 DIAGNOSIS — M62838 Other muscle spasm: Secondary | ICD-10-CM | POA: Insufficient documentation

## 2024-05-21 DIAGNOSIS — M25551 Pain in right hip: Secondary | ICD-10-CM | POA: Insufficient documentation

## 2024-05-21 DIAGNOSIS — R279 Unspecified lack of coordination: Secondary | ICD-10-CM | POA: Diagnosis present

## 2024-05-21 DIAGNOSIS — M5459 Other low back pain: Secondary | ICD-10-CM | POA: Insufficient documentation

## 2024-05-21 DIAGNOSIS — M6281 Muscle weakness (generalized): Secondary | ICD-10-CM | POA: Diagnosis present
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
## 2024-05-23 ENCOUNTER — Ambulatory Visit: Admitting: Physical Therapy
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
## 2024-05-27 ENCOUNTER — Ambulatory Visit: Admitting: Physical Therapy

## 2024-05-29 ENCOUNTER — Telehealth: Payer: Self-pay | Admitting: Podiatry

## 2024-05-29 NOTE — Telephone Encounter (Signed)
 Received call that patient was ready to reschedule surgery. Patient rescheduled for 06/11/2024/.

## 2024-05-30 ENCOUNTER — Ambulatory Visit

## 2024-05-30 ENCOUNTER — Telehealth: Payer: Self-pay | Admitting: Podiatry

## 2024-05-30 NOTE — Telephone Encounter (Signed)
 DOS- 06/11/2024  1ST EXOSTECTOMY RT- 28108  [employer] EFFECTIVE DATE- 05/09/2024  DEDUCTIBLE- $283 REMAINING- $163.75 OOP- $9250 REMAINING- $9130.75 COINSURANCE- 20%  PER [employer] PORTAL, PRIOR AUTH IS NOT REQUIRED FOR CPT CODE 71891. DECISION ID# [id number]

## 2024-06-04 ENCOUNTER — Other Ambulatory Visit (HOSPITAL_COMMUNITY)

## 2024-06-04 ENCOUNTER — Ambulatory Visit (HOSPITAL_COMMUNITY)

## 2024-06-06 ENCOUNTER — Ambulatory Visit

## 2024-06-13 ENCOUNTER — Encounter: Admitting: Podiatry

## 2024-06-20 ENCOUNTER — Encounter: Admitting: Podiatry

## 2024-06-26 ENCOUNTER — Ambulatory Visit: Admitting: Vascular Surgery

## 2024-06-26 ENCOUNTER — Ambulatory Visit (HOSPITAL_COMMUNITY)

## 2024-07-01 ENCOUNTER — Other Ambulatory Visit

## 2024-07-01 ENCOUNTER — Ambulatory Visit: Admitting: Oncology

## 2024-07-02 ENCOUNTER — Ambulatory Visit (HOSPITAL_COMMUNITY)

## 2024-07-02 ENCOUNTER — Other Ambulatory Visit (HOSPITAL_COMMUNITY)

## 2024-07-03 ENCOUNTER — Ambulatory Visit: Admitting: Vascular Surgery

## 2024-07-03 ENCOUNTER — Ambulatory Visit (HOSPITAL_COMMUNITY)

## 2024-07-04 ENCOUNTER — Encounter: Admitting: Podiatry

## 2024-07-25 ENCOUNTER — Encounter: Admitting: Podiatry

## 2024-09-09 ENCOUNTER — Ambulatory Visit: Admitting: Neurology
